# Patient Record
Sex: Female | Born: 1960 | Race: Black or African American | Hispanic: No | Marital: Single | Smoking: Light tobacco smoker
Health system: Southern US, Community
[De-identification: ages and names within clinical notes are randomized; demographics above are authoritative.]

## PROBLEM LIST (undated history)

## (undated) DIAGNOSIS — C9110 Chronic lymphocytic leukemia of B-cell type not having achieved remission: Secondary | ICD-10-CM

## (undated) DIAGNOSIS — G8929 Other chronic pain: Secondary | ICD-10-CM

## (undated) DIAGNOSIS — A048 Other specified bacterial intestinal infections: Secondary | ICD-10-CM

## (undated) DIAGNOSIS — F29 Unspecified psychosis not due to a substance or known physiological condition: Secondary | ICD-10-CM

## (undated) DIAGNOSIS — I10 Essential (primary) hypertension: Secondary | ICD-10-CM

## (undated) DIAGNOSIS — E611 Iron deficiency: Principal | ICD-10-CM

## (undated) DIAGNOSIS — F419 Anxiety disorder, unspecified: Secondary | ICD-10-CM

## (undated) DIAGNOSIS — R109 Unspecified abdominal pain: Secondary | ICD-10-CM

## (undated) DIAGNOSIS — R591 Generalized enlarged lymph nodes: Secondary | ICD-10-CM

## (undated) DIAGNOSIS — F17200 Nicotine dependence, unspecified, uncomplicated: Secondary | ICD-10-CM

## (undated) HISTORY — DX: Unspecified abdominal pain: R10.9

## (undated) HISTORY — DX: Unspecified psychosis not due to a substance or known physiological condition: F29

## (undated) HISTORY — DX: Generalized enlarged lymph nodes: R59.1

## (undated) HISTORY — PX: OTHER SURGICAL HISTORY: SHX169

## (undated) HISTORY — DX: Nicotine dependence, unspecified, uncomplicated: F17.200

## (undated) HISTORY — DX: Essential (primary) hypertension: I10

## (undated) HISTORY — DX: Chronic lymphocytic leukemia of B-cell type not having achieved remission: C91.10

## (undated) HISTORY — DX: Other chronic pain: G89.29

## (undated) HISTORY — PX: CHOLECYSTECTOMY: SHX55

## (undated) HISTORY — DX: Iron deficiency: E61.1

## (undated) HISTORY — DX: Anxiety disorder, unspecified: F41.9

## (undated) HISTORY — PX: HAMMER TOE SURGERY: SHX385

## (undated) HISTORY — DX: Other specified bacterial intestinal infections: A04.8

## (undated) HISTORY — PX: TUBAL LIGATION: SHX77

---

## 2003-03-09 ENCOUNTER — Inpatient Hospital Stay (HOSPITAL_COMMUNITY): Admission: EM | Admit: 2003-03-09 | Discharge: 2003-03-13 | Payer: Self-pay | Admitting: Psychiatry

## 2003-11-03 ENCOUNTER — Emergency Department (HOSPITAL_COMMUNITY): Admission: EM | Admit: 2003-11-03 | Discharge: 2003-11-03 | Payer: Self-pay | Admitting: Emergency Medicine

## 2003-11-22 ENCOUNTER — Ambulatory Visit (HOSPITAL_COMMUNITY): Admission: RE | Admit: 2003-11-22 | Discharge: 2003-11-22 | Payer: Self-pay | Admitting: Family Medicine

## 2003-12-21 ENCOUNTER — Ambulatory Visit (HOSPITAL_COMMUNITY): Admission: RE | Admit: 2003-12-21 | Discharge: 2003-12-21 | Payer: Self-pay | Admitting: Family Medicine

## 2003-12-27 ENCOUNTER — Ambulatory Visit (HOSPITAL_COMMUNITY): Admission: RE | Admit: 2003-12-27 | Discharge: 2003-12-27 | Payer: Self-pay | Admitting: Family Medicine

## 2004-02-01 ENCOUNTER — Ambulatory Visit (HOSPITAL_COMMUNITY): Admission: RE | Admit: 2004-02-01 | Discharge: 2004-02-01 | Payer: Self-pay | Admitting: Family Medicine

## 2004-02-05 ENCOUNTER — Ambulatory Visit (HOSPITAL_COMMUNITY): Admission: RE | Admit: 2004-02-05 | Discharge: 2004-02-05 | Payer: Self-pay | Admitting: Family Medicine

## 2004-02-12 ENCOUNTER — Ambulatory Visit (HOSPITAL_COMMUNITY): Admission: RE | Admit: 2004-02-12 | Discharge: 2004-02-12 | Payer: Self-pay | Admitting: Family Medicine

## 2004-03-19 ENCOUNTER — Inpatient Hospital Stay (HOSPITAL_COMMUNITY): Admission: RE | Admit: 2004-03-19 | Discharge: 2004-03-20 | Payer: Self-pay | Admitting: Interventional Radiology

## 2004-07-25 ENCOUNTER — Ambulatory Visit: Payer: Self-pay | Admitting: Family Medicine

## 2005-03-24 ENCOUNTER — Ambulatory Visit: Payer: Self-pay | Admitting: Family Medicine

## 2005-04-01 ENCOUNTER — Ambulatory Visit (HOSPITAL_COMMUNITY): Admission: RE | Admit: 2005-04-01 | Discharge: 2005-04-01 | Payer: Self-pay | Admitting: Family Medicine

## 2005-05-13 ENCOUNTER — Ambulatory Visit: Payer: Self-pay | Admitting: Family Medicine

## 2006-01-29 ENCOUNTER — Encounter: Payer: Self-pay | Admitting: Family Medicine

## 2006-01-29 LAB — CONVERTED CEMR LAB: Pap Smear: NORMAL

## 2007-01-05 ENCOUNTER — Ambulatory Visit: Payer: Self-pay | Admitting: Family Medicine

## 2007-01-05 LAB — CONVERTED CEMR LAB
ALT: 11 units/L (ref 0–35)
Alkaline Phosphatase: 151 units/L — ABNORMAL HIGH (ref 39–117)
BUN: 8 mg/dL (ref 6–23)
Basophils Relative: 0 % (ref 0–1)
Cholesterol: 150 mg/dL (ref 0–200)
Creatinine, Ser: 0.62 mg/dL (ref 0.40–1.20)
Eosinophils Absolute: 0.1 10*3/uL (ref 0.0–0.7)
Indirect Bilirubin: 0.4 mg/dL (ref 0.0–0.9)
MCHC: 33.8 g/dL (ref 30.0–36.0)
MCV: 87.8 fL (ref 78.0–100.0)
Monocytes Relative: 7 % (ref 3–11)
Neutrophils Relative %: 47 % (ref 43–77)
Potassium: 3 meq/L — ABNORMAL LOW (ref 3.5–5.3)
RBC: 4.42 M/uL (ref 3.87–5.11)
Total Protein: 7.9 g/dL (ref 6.0–8.3)
Triglycerides: 78 mg/dL (ref <150)
VLDL: 16 mg/dL (ref 0–40)

## 2007-01-12 ENCOUNTER — Ambulatory Visit: Payer: Self-pay | Admitting: Family Medicine

## 2007-01-22 ENCOUNTER — Ambulatory Visit: Payer: Self-pay | Admitting: Family Medicine

## 2007-01-23 ENCOUNTER — Encounter: Payer: Self-pay | Admitting: Family Medicine

## 2007-01-29 ENCOUNTER — Ambulatory Visit (HOSPITAL_COMMUNITY): Admission: RE | Admit: 2007-01-29 | Discharge: 2007-01-29 | Payer: Self-pay | Admitting: Family Medicine

## 2007-02-05 ENCOUNTER — Encounter: Payer: Self-pay | Admitting: Family Medicine

## 2007-02-05 LAB — CONVERTED CEMR LAB
BUN: 13 mg/dL (ref 6–23)
Calcium: 9.4 mg/dL (ref 8.4–10.5)
Glucose, Bld: 75 mg/dL (ref 70–99)

## 2007-02-09 ENCOUNTER — Encounter: Payer: Self-pay | Admitting: Family Medicine

## 2007-02-09 ENCOUNTER — Ambulatory Visit: Payer: Self-pay | Admitting: Family Medicine

## 2007-02-09 ENCOUNTER — Other Ambulatory Visit: Admission: RE | Admit: 2007-02-09 | Discharge: 2007-02-09 | Payer: Self-pay | Admitting: Family Medicine

## 2007-02-09 LAB — CONVERTED CEMR LAB: Pap Smear: NORMAL

## 2007-06-14 ENCOUNTER — Ambulatory Visit: Payer: Self-pay | Admitting: Family Medicine

## 2007-06-18 ENCOUNTER — Ambulatory Visit: Payer: Self-pay | Admitting: Family Medicine

## 2007-08-05 ENCOUNTER — Ambulatory Visit: Payer: Self-pay | Admitting: Family Medicine

## 2007-09-03 ENCOUNTER — Encounter: Payer: Self-pay | Admitting: Family Medicine

## 2007-09-20 ENCOUNTER — Ambulatory Visit: Payer: Self-pay | Admitting: Family Medicine

## 2007-09-27 ENCOUNTER — Emergency Department (HOSPITAL_COMMUNITY): Admission: EM | Admit: 2007-09-27 | Discharge: 2007-09-28 | Payer: Self-pay | Admitting: Emergency Medicine

## 2007-10-05 ENCOUNTER — Ambulatory Visit: Payer: Self-pay | Admitting: Family Medicine

## 2007-10-05 LAB — CONVERTED CEMR LAB
ALT: 12 units/L (ref 0–35)
Albumin: 4.6 g/dL (ref 3.5–5.2)
BUN: 9 mg/dL (ref 6–23)
Basophils Absolute: 0 10*3/uL (ref 0.0–0.1)
CO2: 21 meq/L (ref 19–32)
Chloride: 102 meq/L (ref 96–112)
Creatinine, Ser: 0.71 mg/dL (ref 0.40–1.20)
Glucose, Bld: 98 mg/dL (ref 70–99)
HCT: 38.8 % (ref 36.0–46.0)
Hemoglobin: 13 g/dL (ref 12.0–15.0)
Indirect Bilirubin: 0.3 mg/dL (ref 0.0–0.9)
Lymphocytes Relative: 40 % (ref 12–46)
Lymphs Abs: 3 10*3/uL (ref 0.7–4.0)
Monocytes Absolute: 0.7 10*3/uL (ref 0.1–1.0)
Monocytes Relative: 9 % (ref 3–12)
Neutro Abs: 3.8 10*3/uL (ref 1.7–7.7)
RBC: 4.36 M/uL (ref 3.87–5.11)
Total Protein: 8.2 g/dL (ref 6.0–8.3)

## 2007-10-13 ENCOUNTER — Encounter: Payer: Self-pay | Admitting: Family Medicine

## 2007-10-17 DIAGNOSIS — A048 Other specified bacterial intestinal infections: Secondary | ICD-10-CM

## 2007-10-17 HISTORY — DX: Other specified bacterial intestinal infections: A04.8

## 2007-11-01 ENCOUNTER — Ambulatory Visit: Payer: Self-pay | Admitting: Gastroenterology

## 2007-11-24 DIAGNOSIS — I10 Essential (primary) hypertension: Secondary | ICD-10-CM | POA: Insufficient documentation

## 2007-11-24 DIAGNOSIS — R109 Unspecified abdominal pain: Secondary | ICD-10-CM | POA: Insufficient documentation

## 2007-11-24 DIAGNOSIS — F29 Unspecified psychosis not due to a substance or known physiological condition: Secondary | ICD-10-CM | POA: Insufficient documentation

## 2007-11-26 ENCOUNTER — Ambulatory Visit: Payer: Self-pay | Admitting: Family Medicine

## 2007-11-30 ENCOUNTER — Ambulatory Visit: Payer: Self-pay | Admitting: Gastroenterology

## 2007-11-30 ENCOUNTER — Encounter: Payer: Self-pay | Admitting: Family Medicine

## 2008-01-26 ENCOUNTER — Ambulatory Visit: Payer: Self-pay | Admitting: Family Medicine

## 2008-02-02 ENCOUNTER — Ambulatory Visit (HOSPITAL_COMMUNITY): Admission: RE | Admit: 2008-02-02 | Discharge: 2008-02-02 | Payer: Self-pay | Admitting: Family Medicine

## 2008-03-30 ENCOUNTER — Encounter: Payer: Self-pay | Admitting: Family Medicine

## 2008-03-30 LAB — CONVERTED CEMR LAB
BUN: 9 mg/dL (ref 6–23)
Cholesterol: 152 mg/dL (ref 0–200)
Creatinine, Ser: 0.65 mg/dL (ref 0.40–1.20)
Glucose, Bld: 77 mg/dL (ref 70–99)
LDL Cholesterol: 76 mg/dL (ref 0–99)
Potassium: 4.2 meq/L (ref 3.5–5.3)
VLDL: 13 mg/dL (ref 0–40)

## 2008-04-24 DIAGNOSIS — Z9189 Other specified personal risk factors, not elsewhere classified: Secondary | ICD-10-CM | POA: Insufficient documentation

## 2008-04-24 DIAGNOSIS — Z8711 Personal history of peptic ulcer disease: Secondary | ICD-10-CM

## 2008-04-24 DIAGNOSIS — R634 Abnormal weight loss: Secondary | ICD-10-CM

## 2008-04-24 DIAGNOSIS — R112 Nausea with vomiting, unspecified: Secondary | ICD-10-CM

## 2008-06-14 ENCOUNTER — Ambulatory Visit: Payer: Self-pay | Admitting: Family Medicine

## 2008-06-19 ENCOUNTER — Telehealth: Payer: Self-pay | Admitting: Family Medicine

## 2008-06-19 ENCOUNTER — Telehealth (INDEPENDENT_AMBULATORY_CARE_PROVIDER_SITE_OTHER): Payer: Self-pay | Admitting: *Deleted

## 2008-08-20 ENCOUNTER — Emergency Department (HOSPITAL_COMMUNITY): Admission: EM | Admit: 2008-08-20 | Discharge: 2008-08-20 | Payer: Self-pay | Admitting: Emergency Medicine

## 2008-08-23 ENCOUNTER — Encounter: Payer: Self-pay | Admitting: Family Medicine

## 2008-08-23 ENCOUNTER — Other Ambulatory Visit: Admission: RE | Admit: 2008-08-23 | Discharge: 2008-08-23 | Payer: Self-pay | Admitting: Family Medicine

## 2008-08-23 ENCOUNTER — Ambulatory Visit: Payer: Self-pay | Admitting: Family Medicine

## 2008-08-30 ENCOUNTER — Observation Stay (HOSPITAL_COMMUNITY): Admission: RE | Admit: 2008-08-30 | Discharge: 2008-08-31 | Payer: Self-pay | Admitting: General Surgery

## 2008-08-30 ENCOUNTER — Encounter (INDEPENDENT_AMBULATORY_CARE_PROVIDER_SITE_OTHER): Payer: Self-pay | Admitting: General Surgery

## 2009-01-29 ENCOUNTER — Ambulatory Visit: Payer: Self-pay | Admitting: Family Medicine

## 2009-01-29 DIAGNOSIS — M79609 Pain in unspecified limb: Secondary | ICD-10-CM

## 2009-01-29 DIAGNOSIS — M542 Cervicalgia: Secondary | ICD-10-CM | POA: Insufficient documentation

## 2009-01-29 DIAGNOSIS — M546 Pain in thoracic spine: Secondary | ICD-10-CM | POA: Insufficient documentation

## 2009-01-29 DIAGNOSIS — M25519 Pain in unspecified shoulder: Secondary | ICD-10-CM

## 2009-01-29 HISTORY — DX: Pain in unspecified limb: M79.609

## 2009-01-30 ENCOUNTER — Telehealth: Payer: Self-pay | Admitting: Family Medicine

## 2009-01-30 LAB — CONVERTED CEMR LAB
Hemoglobin: 11.9 g/dL — ABNORMAL LOW (ref 12.0–15.0)
Lymphocytes Relative: 40 % (ref 12–46)
MCHC: 34.1 g/dL (ref 30.0–36.0)
Monocytes Absolute: 0.5 10*3/uL (ref 0.1–1.0)
Monocytes Relative: 7 % (ref 3–12)
Neutro Abs: 3.6 10*3/uL (ref 1.7–7.7)
RBC: 3.93 M/uL (ref 3.87–5.11)
Rhuematoid fact SerPl-aCnc: <20 intl units/mL (ref 0–20)
Sed Rate: 28 mm/hr — ABNORMAL HIGH (ref 0–22)

## 2009-01-31 ENCOUNTER — Ambulatory Visit (HOSPITAL_COMMUNITY): Admission: RE | Admit: 2009-01-31 | Discharge: 2009-01-31 | Payer: Self-pay | Admitting: Family Medicine

## 2009-02-02 ENCOUNTER — Telehealth: Payer: Self-pay | Admitting: Family Medicine

## 2009-02-26 ENCOUNTER — Encounter: Payer: Self-pay | Admitting: Family Medicine

## 2009-02-28 ENCOUNTER — Encounter: Payer: Self-pay | Admitting: Family Medicine

## 2009-05-14 ENCOUNTER — Ambulatory Visit: Payer: Self-pay | Admitting: Family Medicine

## 2009-05-14 DIAGNOSIS — N3020 Other chronic cystitis without hematuria: Secondary | ICD-10-CM

## 2009-05-14 LAB — CONVERTED CEMR LAB
Glucose, Urine, Semiquant: NEGATIVE
Nitrite: NEGATIVE
Urobilinogen, UA: 0.2

## 2009-05-15 ENCOUNTER — Encounter: Payer: Self-pay | Admitting: Family Medicine

## 2009-06-12 ENCOUNTER — Ambulatory Visit (HOSPITAL_COMMUNITY): Admission: RE | Admit: 2009-06-12 | Discharge: 2009-06-12 | Payer: Self-pay | Admitting: Family Medicine

## 2009-06-26 ENCOUNTER — Telehealth: Payer: Self-pay | Admitting: Family Medicine

## 2009-07-24 ENCOUNTER — Ambulatory Visit: Payer: Self-pay | Admitting: Family Medicine

## 2009-07-24 DIAGNOSIS — R5383 Other fatigue: Secondary | ICD-10-CM

## 2009-07-24 DIAGNOSIS — R5381 Other malaise: Secondary | ICD-10-CM

## 2009-07-24 LAB — CONVERTED CEMR LAB
Basophils Relative: 0 % (ref 0–1)
CO2: 19 meq/L (ref 19–32)
Calcium: 9.5 mg/dL (ref 8.4–10.5)
HDL: 65 mg/dL (ref >39)
Lymphs Abs: 3.2 10*3/uL (ref 0.7–4.0)
MCHC: 32.5 g/dL (ref 30.0–36.0)
Monocytes Relative: 8 % (ref 3–12)
Neutro Abs: 3.6 10*3/uL (ref 1.7–7.7)
Neutrophils Relative %: 48 % (ref 43–77)
RBC: 4.02 M/uL (ref 3.87–5.11)
Sodium: 141 meq/L (ref 135–145)
Total CHOL/HDL Ratio: 2.2
Triglycerides: 39 mg/dL (ref <150)
WBC: 7.6 10*3/uL (ref 4.0–10.5)

## 2009-07-25 ENCOUNTER — Telehealth: Payer: Self-pay | Admitting: Family Medicine

## 2009-07-25 DIAGNOSIS — J309 Allergic rhinitis, unspecified: Secondary | ICD-10-CM | POA: Insufficient documentation

## 2009-08-02 ENCOUNTER — Ambulatory Visit (HOSPITAL_COMMUNITY): Admission: RE | Admit: 2009-08-02 | Discharge: 2009-08-02 | Payer: Self-pay | Admitting: Family Medicine

## 2009-10-02 ENCOUNTER — Encounter (INDEPENDENT_AMBULATORY_CARE_PROVIDER_SITE_OTHER): Payer: Self-pay | Admitting: *Deleted

## 2009-12-27 ENCOUNTER — Ambulatory Visit: Payer: Self-pay | Admitting: Family Medicine

## 2009-12-27 ENCOUNTER — Other Ambulatory Visit: Admission: RE | Admit: 2009-12-27 | Discharge: 2009-12-27 | Payer: Self-pay | Admitting: Family Medicine

## 2009-12-27 DIAGNOSIS — N3000 Acute cystitis without hematuria: Secondary | ICD-10-CM

## 2009-12-27 DIAGNOSIS — N760 Acute vaginitis: Secondary | ICD-10-CM | POA: Insufficient documentation

## 2009-12-27 LAB — CONVERTED CEMR LAB
Glucose, Urine, Semiquant: NEGATIVE
Nitrite: NEGATIVE
Protein, U semiquant: NEGATIVE
Urobilinogen, UA: 0.2
WBC Urine, dipstick: NEGATIVE
pH: 6

## 2009-12-28 ENCOUNTER — Encounter: Payer: Self-pay | Admitting: Family Medicine

## 2009-12-28 LAB — CONVERTED CEMR LAB
Bacteria, UA: NONE SEEN
Bilirubin Urine: NEGATIVE
Chlamydia, DNA Probe: NEGATIVE
Crystals: NONE SEEN
Ketones, ur: NEGATIVE mg/dL
RBC / HPF: NONE SEEN (ref <3)
Specific Gravity, Urine: 1.014 (ref 1.005–1.030)
Urobilinogen, UA: 0.2 (ref 0.0–1.0)
pH: 6 (ref 5.0–8.0)

## 2010-01-01 ENCOUNTER — Encounter: Payer: Self-pay | Admitting: Family Medicine

## 2010-01-08 ENCOUNTER — Encounter: Payer: Self-pay | Admitting: Family Medicine

## 2010-04-29 ENCOUNTER — Ambulatory Visit: Payer: Self-pay | Admitting: Family Medicine

## 2010-04-29 DIAGNOSIS — F17200 Nicotine dependence, unspecified, uncomplicated: Secondary | ICD-10-CM

## 2010-08-01 ENCOUNTER — Ambulatory Visit: Payer: Self-pay | Admitting: Family Medicine

## 2010-08-01 DIAGNOSIS — F4323 Adjustment disorder with mixed anxiety and depressed mood: Secondary | ICD-10-CM | POA: Insufficient documentation

## 2010-08-07 LAB — CONVERTED CEMR LAB
BUN: 12 mg/dL (ref 6–23)
Basophils Absolute: 0 10*3/uL (ref 0.0–0.1)
Basophils Relative: 0 % (ref 0–1)
Cholesterol: 162 mg/dL (ref 0–200)
Creatinine, Ser: 0.58 mg/dL (ref 0.40–1.20)
Eosinophils Absolute: 0.1 10*3/uL (ref 0.0–0.7)
HDL: 71 mg/dL (ref >39)
Hemoglobin: 12.5 g/dL (ref 12.0–15.0)
MCHC: 33.2 g/dL (ref 30.0–36.0)
MCV: 90.8 fL (ref 78.0–100.0)
Monocytes Absolute: 0.6 10*3/uL (ref 0.1–1.0)
Monocytes Relative: 8 % (ref 3–12)
Neutrophils Relative %: 44 % (ref 43–77)
RBC: 4.14 M/uL (ref 3.87–5.11)
RDW: 15.6 % — ABNORMAL HIGH (ref 11.5–15.5)
Triglycerides: 38 mg/dL (ref <150)

## 2010-08-10 DIAGNOSIS — E559 Vitamin D deficiency, unspecified: Secondary | ICD-10-CM

## 2010-08-12 ENCOUNTER — Ambulatory Visit (HOSPITAL_COMMUNITY): Admission: RE | Admit: 2010-08-12 | Discharge: 2010-08-12 | Payer: Self-pay | Admitting: Family Medicine

## 2010-08-20 ENCOUNTER — Telehealth: Payer: Self-pay | Admitting: Family Medicine

## 2010-10-05 ENCOUNTER — Encounter: Payer: Self-pay | Admitting: Interventional Radiology

## 2010-10-07 ENCOUNTER — Encounter: Payer: Self-pay | Admitting: Family Medicine

## 2010-10-08 ENCOUNTER — Telehealth: Payer: Self-pay | Admitting: Family Medicine

## 2010-10-15 NOTE — Progress Notes (Signed)
Summary: please advise  Phone Note Other Incoming   Caller: Michell Heinrich from [employer]. Summary of Call: called in and states that Dr. Lodema Hong put patient on a small dose of paxil generic, and patient called her and stated it was too much for her.  She states the patient told her she couldn't go out of the house if she took this medication.  Victorino Dike wanted to know if patient could cut the tablet in half and take it that away.  Please call Victorino Dike with the answer Victorino Dike [phone] said that the Medication was helping with the  OCD and anixety.   Initial call taken by: Curtis Sites,  August 20, 2010 2:59 PM  Follow-up for Phone Call        yes she can do this, pls let her know Follow-up by: Syliva Overman MD,  August 21, 2010 4:59 PM  Additional Follow-up for Phone Call Additional follow up Details #1::        called social worker and left message Additional Follow-up by: Adella Hare LPN,  August 21, 2010 5:15 PM    Additional Follow-up for Phone Call Additional follow up Details #2::    social worker aware Follow-up by: Adella Hare LPN,  August 22, 2010 8:28 AM

## 2010-10-15 NOTE — Miscellaneous (Signed)
Summary: Home Care Report  Home Care Report   Imported By: Lind Guest 01/08/2010 14:51:26  _____________________________________________________________________  External Attachment:    Type:   Image     Comment:   External Document

## 2010-10-15 NOTE — Assessment & Plan Note (Signed)
Summary: F UP   Vital Signs:  Patient profile:   50 year old female Menstrual status:  regular Height:      65 inches Weight:      142.08 pounds BMI:     23.73 O2 Sat:      95 % on Room air Pulse rate:   67 / minute BP sitting:   132 / 68  (right arm) CC: needs a prescription for her pain medication, a follow up room 3 Is Patient Diabetic? No Pain Assessment Patient in pain? no        CC:  needs a prescription for her pain medication and a follow up room 3.  History of Present Illness: Pt is here today for medication refill.  She uses Ibuprofen 800 mg as needed for pain from arthritis.  "widespread pain".  Pt states her pain is mostly in her back and arms.  No joint swelling or redness.  She takes the Ibuprofen as needed, not daily, and works well for her pain.  Hx of HTN. Controlled off meds.  No HA, dizziness, chest pain or pressure. Pt is also a smoker.  States she is smoking 7-9 cigs a day.  Has been able to get as low as 5 cigs per day but hasnt been able to completely quit yet.     Allergies: No Known Drug Allergies  Past History:  Past medical history reviewed for relevance to current acute and chronic problems.  Past Medical History: Reviewed history from 09/03/2007 and no changes required. NICOTINE ADDICTION (ICD-305.1) UNSPECIFIED PSYCHOSIS (ICD-298.9) HYPERTENSION (ICD-401.9) ABDOMINAL PAIN, CHRONIC (ICD-789.00)  Review of Systems CV:  Denies chest pain or discomfort, lightheadness, and palpitations. Resp:  Denies shortness of breath. MS:  Complains of joint pain and thoracic pain; denies joint redness, joint swelling, and muscle weakness. Neuro:  Denies headaches, numbness, and tingling.  Physical Exam  General:  Well-developed,well-nourished,in no acute distress; alert,appropriate and cooperative throughout examination Head:  Normocephalic and atraumatic without obvious abnormalities. No apparent alopecia or balding. Ears:  External ear exam shows  no significant lesions or deformities.  Otoscopic examination reveals clear canals, tympanic membranes are intact bilaterally without bulging, retraction, inflammation or discharge. Hearing is grossly normal bilaterally. Nose:  External nasal examination shows no deformity or inflammation. Nasal mucosa are pink and moist without lesions or exudates. Mouth:  Oral mucosa and oropharynx without lesions or exudates. poor dentition.   Neck:  No deformities, masses, or tenderness noted.no thyromegaly.   Lungs:  Normal respiratory effort, chest expands symmetrically. Lungs are clear to auscultation, no crackles or wheezes. Heart:  Normal rate and regular rhythm. S1 and S2 normal without gallop, murmur, click, rub or other extra sounds. Extremities:  No clubbing, cyanosis, edema, or deformity noted with normal full range of motion of all joints.   Neurologic:  alert & oriented X3, sensation intact to light touch, and gait normal.   Cervical Nodes:  No lymphadenopathy noted Psych:  Cognition and judgment appear intact. Alert and cooperative with normal attention span and concentration. No apparent delusions, illusions, hallucinations   Impression & Recommendations:  Problem # 1:  BACK PAIN, THORACIC REGION (ICD-724.1) Assessment Comment Only  Her updated medication list for this problem includes:    Ibuprofen 800 Mg Tabs (Ibuprofen) .Marland Kitchen... Take 1 tablet by mouth two times a day  for 10 days then a daily as needed , start in am  Problem # 2:  HYPERTENSION (ICD-401.9) Assessment: Improved  The following medications were removed from  the medication list:    Maxzide-25 37.5-25 Mg Tabs (Triamterene-hctz) .Marland Kitchen... Take 1 tablet by mouth once a day  Problem # 3:  NICOTINE ADDICTION (ICD-305.1)  Encouraged smoking cessation   Complete Medication List: 1)  Ibuprofen 800 Mg Tabs (Ibuprofen) .... Take 1 tablet by mouth two times a day  for 10 days then a daily as needed , start in am  Patient  Instructions: 1)  Please schedule a follow-up appointment in 3 months. 2)  I have refilled your Ibuprofen for you. 3)  Tobacco is very bad for your health and your loved ones! You Should stop smoking!. 4)  Stop Smoking Tips: Choose a Quit date. Cut down before the Quit date. decide what you will do as a substitute when you feel the urge to smoke(gum,toothpick,exercise). Prescriptions: IBUPROFEN 800 MG TABS (IBUPROFEN) Take 1 tablet by mouth two times a day  for 10 days then a daily as needed , start in am  #40 Each x 1   Entered and Authorized by:   Esperanza Sheets PA   Signed by:   Esperanza Sheets PA on 04/29/2010   Method used:   Electronically to        [facility] (retail)       [address]       [place], [place]  [address]       Ph: [phone]       Fax: [phone]   RxID:   [id number]

## 2010-10-15 NOTE — Letter (Signed)
Summary: 1st Missed Appt.  [facility]  [address]   [place], [place] [address]   Phone: [phone]  Fax: [phone]    October 02, 2009  MRN: [id number]  [facility] [address] [place], [place]  [address]  Dear Ms. Allison Quarry,  At [facility], we make every attempt to fit patients into our schedule by reserving several appointment slots for same-day appointments.  However, we cannot always make appointments for patients the same day they are calling.  At the end of the day, we look back at our schedule and find that because of last-minute cancellations and patients not showing up for their scheduled appointments, we have several appointment slots that are left open and could have been used by another person who really needed it.  In the past, you may have been one of the patients who could not get in when you needed to.  But recently, you were one of the patients with an appointment that you didn't show up for or canceled too late for [place] to fill it.  We choose not to charge no-show or last minute cancellation fees to our patients, like many other offices do.  We do not wish to institute that policy and hope we never have to.  However, we kindly request that you assist [place] by providing at least 24 hours' notice if you can't make your appointment.  If no-shows or late cancellations become habitual (i.e. Three or more in a one-year period), we may terminate the physician-patient relationship.    Thank you for your consideration and cooperation.   Altamease Oiler   Appended Document: 1st Missed Appt. pt had appt for Babs Bertin day, she reliwes on public transpt and this was not available

## 2010-10-15 NOTE — Letter (Signed)
Summary: Letter  Letter   Imported By: Lind Guest 01/02/2010 13:01:00  _____________________________________________________________________  External Attachment:    Type:   Image     Comment:   External Document

## 2010-10-15 NOTE — Assessment & Plan Note (Signed)
Summary: FEMALE TROUBLES   Vital Signs:  Patient profile:   50 year old female Menstrual status:  regular Height:      65 inches Weight:      177.06 pounds BMI:     29.57 O2 Sat:      94 % Pulse rate:   92 / minute Pulse rhythm:   regular Resp:     16 per minute BP sitting:   134 / 84  (left arm) Cuff size:   regular  Vitals Entered By: Everitt Amber LPN (December 27, 2009 8:59 AM)  Nutrition Counseling: Patient's BMI is greater than 25 and therefore counseled on weight management options. CC: states she has a yeast infection, has thick white discharge and also states she has been urinating frequently. Sometimes she has burning off and on and it has a strong odor   CC:  states she has a yeast infection and has thick white discharge and also states she has been urinating frequently. Sometimes she has burning off and on and it has a strong odor.  History of Present Illness: Reports  that she is generally  doing well. Denies recent fever or chills. Denies sinus pressure, nasal congestion , ear pain or sore throat. Denies chest congestion, or cough productive of sputum. Denies chest pain, palpitations, PND, orthopnea or leg swelling. Denies abdominal pain, nausea, vomitting, diarrhea or constipation. Denies change in bowel movements or bloody stool. Reports dysuria, frequency and thick puritic vag d/c intermittently x 6 weeks Denies  joint pain, swelling, or reduced mobility. Denies headaches, vertigo, seizures. Denies depression, anxiety or insomnia. Denies  rash, lesions, or itch.She does have chronically dry, thickened skin on her soles.     Current Medications (verified): 1)  Ibuprofen 800 Mg Tabs (Ibuprofen) .... Take 1 Tablet By Mouth Two Times A Day  For 10 Days Then A Daily As Needed , Start in Am 2)  Maxzide-25 37.5-25 Mg Tabs (Triamterene-Hctz) .... Take 1 Tablet By Mouth Once A Day 3)  Zyrtec Hives Relief 10 Mg Tabs (Cetirizine Hcl) .... Take 1 Tablet By Mouth Once A Day  As Needed  Allergies (verified): No Known Drug Allergies  Review of Systems Eyes:  Denies blurring, discharge, and red eye. GU:  Complains of discharge, dysuria, and urinary frequency; thick white urine for some time sincwe February no itch. Derm:  Complains of dryness; dry thickened skin on soles. Endo:  Denies excessive hunger and excessive thirst. Heme:  Denies abnormal bruising and bleeding. Allergy:  Complains of seasonal allergies; denies hives or rash and itching eyes; primarily in Spring and fall.  Physical Exam  General:  alert, well-nourished, and well-hydrated. HEENT: No facial asymmetry,  EOMI, No sinus tenderness, TM's Clear, oropharynx  pink and moist. Poor dentition  Chest: Clear to auscultation bilaterally.  CVS: S1, S2, No murmurs, No S3.   Abd: Soft, Nontender.  MS: Adequate ROM spine, hips, shoulders and knees.  Ext: No edema.   CNS: CN 2-12 intact, power tone and sensation normal throughout.   Skin: Intact, no visible lesions or rashes.  Psych: Good eye contact, normal affect.  Memory intact, not anxious or depressed appearing. Genital:uterus normal size, no adnexal masses, positive cervical motion tendrness, thick white d/c    Impression & Recommendations:  Problem # 1:  ACUTE CYSTITIS (ICD-595.0) Assessment Comment Only  Orders: T-Culture, Urine ([id number]) T-Urinalysis (81003-65000) nitrite andleukoneg, pos blood UA Dipstick W/ Micro (manual) (14782)  Encouraged to push clear liquids, get enough rest, and take acetaminophen  as needed. To be seen in 10 days if no improvement, sooner if worse.  Problem # 2:  UNSPECIFIED VAGINITIS AND VULVOVAGINITIS (ICD-616.10) Assessment: Comment Only  Orders: T-Chlamydia & GC Probe, Genital ([id number]) T-Wet Prep by Molecular Probe [phone])  will treat based on results  Problem # 3:  ALLERGIC RHINITIS CAUSE UNSPECIFIED (ICD-477.9) Assessment: Unchanged  Her updated medication list for this  problem includes:    Zyrtec Hives Relief 10 Mg Tabs (Cetirizine hcl) .Marland Kitchen... Take 1 tablet by mouth once a day as needed  Problem # 4:  HYPERTENSION (ICD-401.9) Assessment: Unchanged  Her updated medication list for this problem includes:    Maxzide-25 37.5-25 Mg Tabs (Triamterene-hctz) .Marland Kitchen... Take 1 tablet by mouth once a day  BP today: 134/84 Prior BP: 135/85 (07/24/2009)  Labs Reviewed: K+: 3.5 (07/24/2009) Creat: : 0.59 (07/24/2009)   Chol: 145 (07/24/2009)   HDL: 65 (07/24/2009)   LDL: 72 (07/24/2009)   TG: 39 (07/24/2009)  Complete Medication List: 1)  Ibuprofen 800 Mg Tabs (Ibuprofen) .... Take 1 tablet by mouth two times a day  for 10 days then a daily as needed , start in am 2)  Maxzide-25 37.5-25 Mg Tabs (Triamterene-hctz) .... Take 1 tablet by mouth once a day 3)  Zyrtec Hives Relief 10 Mg Tabs (Cetirizine hcl) .... Take 1 tablet by mouth once a day as needed  Other Orders: Hemoccult Guaiac-1 spec.(in office) (82270) Pap Smear (96295)  Patient Instructions: 1)  Please schedule a follow-up appointment in 4 months. 2)  It is important that you exercise regularly at least 20 minutes 5 times a week. If you develop chest pain, have severe difficulty breathing, or feel very tired , stop exercising immediately and seek medical attention. 3)  we will send your samples for testing and getyback to you with results, and treatif needed 4)  Continue current meds  Laboratory Results   Urine Tests    Routine Urinalysis   Color: lt. yellow Appearance: Clear Glucose: negative   (Normal Range: Negative) Bilirubin: negative   (Normal Range: Negative) Ketone: negative   (Normal Range: Negative) Spec. Gravity: 1.020   (Normal Range: 1.003-1.035) Blood: small   (Normal Range: Negative) pH: 6.0   (Normal Range: 5.0-8.0) Protein: negative   (Normal Range: Negative) Urobilinogen: 0.2   (Normal Range: 0-1) Nitrite: negative   (Normal Range: Negative) Leukocyte Esterace: negative    (Normal Range: Negative)      Stool - Occult Blood Hemmoccult #1: negative Date: 12/27/2009 Comments: [id number] 9r 8/11 118 10 12

## 2010-10-15 NOTE — Assessment & Plan Note (Signed)
Summary: office visit   Vital Signs:  Patient profile:   50 year old female Menstrual status:  regular Height:      65 inches Weight:      140 pounds BMI:     23.38 O2 Sat:      98 % on Room air Pulse rate:   91 / minute Pulse rhythm:   regular Resp:     16 per minute BP sitting:   154 / 82  (left arm)  Vitals Entered By: Mauricia Area CMA (August 01, 2010 1:02 PM)  O2 Flow:  Room air CC: Follow up Comments Case worker suggests meds for anxiety   CC:  Follow up.  History of Present Illness: Pt in with her social; worker who states that recently she had her apt painted  and this was left in a trashy situation , she is now having  to buy new things.The pt i s reportedly demonstrating increased anxiety and a deterioration in her mentalhealth,this is themain reason for the visit, Shannah stopped her bP meds because she thought it was not necessary and was causing her to feel badly, she is refusing to resume at this time   Preventive Screening-Counseling & Management  Alcohol-Tobacco     Smoking Cessation Counseling: yes  Current Medications (verified): 1)  Ibuprofen 800 Mg Tabs (Ibuprofen) .... Take 1 Tablet By Mouth Two Times A Day  For 10 Days Then A Daily As Needed , Start in Am  Allergies (verified): No Known Drug Allergies  Review of Systems      See HPI General:  Complains of fatigue. Eyes:  Denies discharge and red eye. ENT:  Denies hoarseness, nasal congestion, and sinus pressure. CV:  Denies chest pain or discomfort, palpitations, and swelling of feet. Resp:  Complains of cough and shortness of breath; denies sputum productive and wheezing. GI:  Denies abdominal pain, constipation, diarrhea, nausea, and vomiting blood. GU:  Denies discharge, dysuria, and urinary frequency. MS:  Denies joint pain, low back pain, mid back pain, and stiffness. Derm:  Complains of lesion(s); thick scaling of soles of feet. Neuro:  Denies headaches and seizures. Psych:  Complains  of anxiety, irritability, and mental problems; denies suicidal thoughts/plans, thoughts of violence, and unusual visions or sounds. Endo:  Denies cold intolerance, excessive thirst, excessive urination, and heat intolerance. Heme:  Denies abnormal bruising and bleeding. Allergy:  Complains of seasonal allergies.  Physical Exam  General:  Well-developed,well-nourished,in no acute distress; alert,appropriate and cooperative throughout examination HEENT: No facial asymmetry,  EOMI, No sinus tenderness, TM's Clear, oropharynx  pink and moist.   Chest: Clear to auscultation bilaterally. decreased air entry bilaterally CVS: S1, S2, No murmurs, No S3.   Abd: Soft, Nontender.  MS: Adequate ROM spine, hips, shoulders and knees.  Ext: No edema.   CNS: CN 2-12 intact, power tone and sensation normal throughout.   Skin: Intact, no visible lesions or rashes.  Psych: Good eye contact, normal affect.  Memory fair, anxious    Impression & Recommendations:  Problem # 1:  ADJ DISORDER WITH MIXED ANXIETY & DEPRESSED MOOD (ICD-309.28) Assessment Deteriorated paxil prescribed  Problem # 2:  NICOTINE ADDICTION (ICD-305.1) Assessment: Unchanged  Encouraged smoking cessation and discussed different methods for smoking cessation.   Problem # 3:  ESSENTIAL HYPERTENSION (ICD-401.9) Assessment: Deteriorated  Orders: T-Basic Metabolic Panel [phone])  BP today: 154/82 Prior BP: 132/68 (04/29/2010) Patient advised to follow low sodium diet rich in fruit and vegetables, and to commit to at least  30 minutes 5 days per week of regular exercise , to improve blood presure control.  pt refusing md at this time Labs Reviewed: K+: 3.5 (07/24/2009) Creat: : 0.59 (07/24/2009)   Chol: 145 (07/24/2009)   HDL: 65 (07/24/2009)   LDL: 72 (07/24/2009)   TG: 39 (07/24/2009)  Problem # 4:  UNSPECIFIED VITAMIN D DEFICIENCY (ICD-268.9) Assessment: Comment Only supplements tstarted  Complete Medication List: 1)   Ibuprofen 800 Mg Tabs (Ibuprofen) .... Take 1 tablet by mouth two times a day  for 10 days then a daily as needed , start in am 2)  Paroxetine Hcl 12.5 Mg Xr24h-tab (Paroxetine hcl) .... Take 1 tablet by mouth once a day 3)  Vitamin D (ergocalciferol) 50000 Unit Caps (Ergocalciferol) .... One capsule once weekly 4)  Oscal 500/200 D-3 500-200 Mg-unit Tabs (Calcium carbonate-vitamin d) .... Take 1 tablet by mouth three times a day  Other Orders: T-Lipid Profile [phone]) T-CBC w/Diff 713 272 2599) T-TSH [phone]) T-Vitamin D (25-Hydroxy) [phone]) Influenza Vaccine NON MCR (94854) Radiology Referral (Radiology)  Patient Instructions: 1)  CPE in 2 to 2.5 months. 2)  BMP prior to visit, ICD-9: 3)  Lipid Panel prior to visit, ICD-9: 4)  TSH prior to visit, ICD-9:   today fasting 5)  CBC w/ Diff prior to visit, ICD-9 6)  Vitamin D: 7)  Tobacco is very bad for your health and your loved ones! You Should stop smoking!. 8)  Stop Smoking Tips: Choose a Quit date. Cut down before the Quit date. decide what you will do as a substitute when you feel the urge to smoke(gum,toothpick,exercise). 9)  Flu vac today 10)  [place] asap , we will schedule Prescriptions: OSCAL 500/200 D-3 500-200 MG-UNIT TABS (CALCIUM CARBONATE-VITAMIN D) Take 1 tablet by mouth three times a day  #90 x 11   Entered and Authorized by:   Syliva Overman MD   Signed by:   Syliva Overman MD on 08/04/2010   Method used:   Historical   RxID:   [id number] VITAMIN D (ERGOCALCIFEROL) 50000 UNIT CAPS (ERGOCALCIFEROL) one capsule once weekly  #4 x 4   Entered and Authorized by:   Syliva Overman MD   Signed by:   Syliva Overman MD on 08/04/2010   Method used:   Historical   RxID:   [id number] PAROXETINE HCL 12.5 MG XR24H-TAB (PAROXETINE HCL) Take 1 tablet by mouth once a day  #30 x 2   Entered and Authorized by:   Syliva Overman MD   Signed by:   Syliva Overman MD on 08/01/2010   Method used:    Electronically to        [facility] (retail)       [address]       [place], [place]  [address]       Ph: [phone]       Fax: [phone]   RxID:   [id number]    Orders Added: 1)  Est. Patient Level IV [45809] 2)  T-Basic Metabolic Panel [80048-22910] 3)  T-Lipid Profile [80061-22930] 4)  T-CBC w/Diff [98338-25053] 5)  T-TSH [97673-41937] 6)  T-Vitamin D (25-Hydroxy) [90240-97353] 7)  Influenza Vaccine NON MCR [00028] 8)  Radiology Referral [Radiology]   Immunizations Administered:  Influenza Vaccine # 1:    Vaccine Type: Fluvax Non-MCR    Site: right deltoid    Mfr: novartis    Dose: 0.5 ml    Route: IM    Given by: Marijean Niemann  Boothe LPN    Exp. Date: 01/2011    Lot #: [id number]    VIS given: 04/09/10 version given August 01, 2010.   Immunizations Administered:  Influenza Vaccine # 1:    Vaccine Type: Fluvax Non-MCR    Site: right deltoid    Mfr: novartis    Dose: 0.5 ml    Route: IM    Given by: Adella Hare LPN    Exp. Date: 01/2011    Lot #: [id number]    VIS given: 04/09/10 version given August 01, 2010.

## 2010-10-17 NOTE — Progress Notes (Signed)
Summary: MEDICINE MAKING HER HEART BEAT FAST  Phone Note Call from Patient   Summary of Call: [name] HER SOCIAL WORKER CALLED [facility] MESSAGE THAT THE PAXIL IS MAKING HER HEART BEAT FAST AND SHE WILL NOT TAKE IT WANTS TO KNOW WHAT TO DO CALL BACK AT [phone] Initial call taken by: Lind Guest,  October 08, 2010 1:28 PM  Follow-up for Phone Call        if she feels med is bothering her that way, needs to wean off, take one every other day for the next week then stop. she needs an appt with mental health to take care of her nerves Follow-up by: Syliva Overman MD,  October 08, 2010 4:58 PM  Additional Follow-up for Phone Call Additional follow up Details #1::        returned call, left message Additional Follow-up by: Adella Hare LPN,  October 09, 2010 9:39 AM    Additional Follow-up for Phone Call Additional follow up Details #2::    social worker states she refuses to take the med, and is aware she needs to see mental health  states patient is some better Follow-up by: Adella Hare LPN,  October 09, 2010 9:51 AM

## 2010-11-26 ENCOUNTER — Encounter: Payer: Self-pay | Admitting: Family Medicine

## 2010-11-26 ENCOUNTER — Encounter (INDEPENDENT_AMBULATORY_CARE_PROVIDER_SITE_OTHER): Payer: Medicaid Other | Admitting: Family Medicine

## 2010-11-26 DIAGNOSIS — F17200 Nicotine dependence, unspecified, uncomplicated: Secondary | ICD-10-CM

## 2010-11-26 DIAGNOSIS — F4323 Adjustment disorder with mixed anxiety and depressed mood: Secondary | ICD-10-CM

## 2010-11-26 DIAGNOSIS — I10 Essential (primary) hypertension: Secondary | ICD-10-CM

## 2010-12-12 NOTE — Assessment & Plan Note (Signed)
Summary: PHY   Vital Signs:  Patient profile:   50 year old female Menstrual status:  regular LMP:     11/14/2010 Height:      65 inches Weight:      141.75 pounds BMI:     23.67 O2 Sat:      98 % Pulse rate:   99 / minute Pulse rhythm:   regular Resp:     16 per minute BP supine:   150 / 90  (right arm) BP sitting:   142 / 92  (left arm) Cuff size:   regular  Vitals Entered By: Everitt Amber LPN (November 26, 2010 9:28 AM) CC: CPE LMP (date): 11/14/2010     Enter LMP: 11/14/2010 Last PAP Result NEGATIVE FOR INTRAEPITHELIAL LESIONS OR MALIGNANCY.   CC:  CPE.  History of Present Illness: Reports  that  she has been stressed for over 1 year, no warm water in her home, has been bathinhg at her daughter's home, this has caused her to be very stressed and anxious. She was intolerant of paxil, stated it made her heart race. Denies recent fever or chills. Denies sinus pressure, nasal congestion , ear pain or sore throat. Denies chest congestion, or cough productive of sputum. Denies chest pain, palpitations, PND, orthopnea or leg swelling. Denies abdominal pain, nausea, vomitting, diarrhea or constipation. Denies change in bowel movements or bloody stool. Denies dysuria , frequency, incontinence or hesitancy. Denies  joint pain, swelling, or reduced mobility. Denies headaches, vertigo, seizures.    Preventive Screening-Counseling & Management  Alcohol-Tobacco     Smoking Cessation Counseling: yes  Current Medications (verified): 1)  Ibuprofen 800 Mg Tabs (Ibuprofen) .... Take 1 Tablet By Mouth Two Times A Day  For 10 Days Then A Daily As Needed , Start in Am 2)  Vitamin D (Ergocalciferol) 50000 Unit Caps (Ergocalciferol) .... One Capsule Once Weekly 3)  Oscal 500/200 D-3 500-200 Mg-Unit Tabs (Calcium Carbonate-Vitamin D) .... Take 1 Tablet By Mouth Three Times A Day  Allergies (verified): No Known Drug Allergies  Review of Systems      See HPI Eyes:  Denies discharge  and red eye. Derm:  Complains of changes in nail beds, dryness, and lesion(s); thickened skin on soles with callouses and severe onychomycosis. Psych:  Complains of anxiety and mental problems; denies suicidal thoughts/plans, thoughts of violence, and unusual visions or sounds. Heme:  Denies abnormal bruising, bleeding, and enlarge lymph nodes. Allergy:  Complains of seasonal allergies; denies hives or rash and itching eyes.  Physical Exam  General:  Well-developed,well-nourished,in no acute distress; alert,appropriate and cooperative throughout examination HEENT: No facial asymmetry,  EOMI, No sinus tenderness, TM's Clear, oropharynx  pink and moist.   Chest: Clear to auscultation bilaterally.  CVS: S1, S2, No murmurs, No S3.   Abd: Soft, Nontender.  MS: Adequate ROM spine, hips, shoulders and knees.  Ext: No edema.   CNS: CN 2-12 intact, power tone and sensation normal throughout.   Skin: Intact, thickening of the skin on the soles of her feet and callouses Psych: Good eye contact, normal affect.  Memory intact, anxious but not  depressed appearing.    Impression & Recommendations:  Problem # 1:  NICOTINE ADDICTION (ICD-305.1) Assessment Unchanged  Encouraged smoking cessation and discussed different methods for smoking cessation.   Problem # 2:  ESSENTIAL HYPERTENSION (ICD-401.9) Assessment: Unchanged  Her updated medication list for this problem includes:    Amlodipine Besylate 2.5 Mg Tabs (Amlodipine besylate) .Marland Kitchen... Take 1 tablet  by mouth once a day will need to inc med dose if still elevatedat next visit today: 142/92 Prior BP: 154/82 (08/01/2010)  Labs Reviewed: K+: 3.6 (08/01/2010) Creat: : 0.58 (08/01/2010)   Chol: 162 (08/01/2010)   HDL: 71 (08/01/2010)   LDL: 83 (08/01/2010)   TG: 38 (08/01/2010)  Problem # 3:  UNSPECIFIED PSYCHOSIS (ICD-298.9) Assessment: Unchanged  Problem # 4:  ADJ DISORDER WITH MIXED ANXIETY & DEPRESSED MOOD (ICD-309.28) Assessment:  Deteriorated buspar prescribed  Problem # 5:  BACK PAIN, THORACIC REGION (ICD-724.1) Assessment: Unchanged  Her updated medication list for this problem includes:    Ibuprofen 800 Mg Tabs (Ibuprofen) .Marland Kitchen... Take 1 tablet by mouth two times a day  for 10 days then a daily as needed , start in am  Complete Medication List: 1)  Ibuprofen 800 Mg Tabs (Ibuprofen) .... Take 1 tablet by mouth two times a day  for 10 days then a daily as needed , start in am 2)  Vitamin D (ergocalciferol) 50000 Unit Caps (Ergocalciferol) .... One capsule once weekly 3)  Oscal 500/200 D-3 500-200 Mg-unit Tabs (Calcium carbonate-vitamin d) .... Take 1 tablet by mouth three times a day 4)  Amlodipine Besylate 2.5 Mg Tabs (Amlodipine besylate) .... Take 1 tablet by mouth once a day 5)  Buspirone Hcl 5 Mg Tabs (Buspirone hcl) .... Take 1 tablet by mouth two times a day  Patient Instructions: 1)  Pap in 5 to 6 weeks 2)  Tobacco is very bad for your health and your loved ones! You Should stop smoking!. 3)  Stop Smoking Tips: Choose a Quit date. Cut down before the Quit date. decide what you will do as a substitute when you feel the urge to smoke(gum,toothpick,exercise). 4)  It is important that you exercise regularly at least 20 minutes 5 times a week. If you develop chest pain, have severe difficulty breathing, or feel very tired , stop exercising immediately and seek medical attention. 5)  You need to lose weight. Consider a lower calorie diet and regular exercise.  6)  Your blood pressure is high , med is sent in for this also for anxiety. 7)  I hope your living situation improves soon  Prescriptions: IBUPROFEN 800 MG TABS (IBUPROFEN) Take 1 tablet by mouth two times a day  for 10 days then a daily as needed , start in am  #40 Each x 1   Entered by:   Everitt Amber LPN   Authorized by:   Syliva Overman MD   Signed by:   Everitt Amber LPN on 57/84/6962   Method used:   Electronically to        [facility]  (retail)       [address] [place], [place]  [address]       Ph: [phone]       Fax: [phone]   RxID:   [id number] BUSPIRONE HCL 5 MG TABS (BUSPIRONE HCL) Take 1 tablet by mouth two times a day  #60 x 3   Entered and Authorized by:   Syliva Overman MD   Signed by:   Syliva Overman MD on 11/26/2010   Method used:   Electronically to        [facility] (retail)       [address]       [place], [place]  [address]  Ph: [phone]       Fax: [phone]   RxID:   [id number] AMLODIPINE BESYLATE 2.5 MG TABS (AMLODIPINE BESYLATE) Take 1 tablet by mouth once a day  #30 x 2   Entered and Authorized by:   Syliva Overman MD   Signed by:   Syliva Overman MD on 11/26/2010   Method used:   Electronically to        [facility] (retail)       [address]       [place], [place]  [address]       Ph: [phone]       Fax: [phone]   RxID:   [id number]    Orders Added: 1)  Est. Patient Level IV [63016]

## 2011-01-06 ENCOUNTER — Encounter: Payer: Self-pay | Admitting: Family Medicine

## 2011-01-07 ENCOUNTER — Encounter: Payer: Self-pay | Admitting: Family Medicine

## 2011-01-07 ENCOUNTER — Ambulatory Visit (INDEPENDENT_AMBULATORY_CARE_PROVIDER_SITE_OTHER): Payer: Medicaid Other | Admitting: Family Medicine

## 2011-01-07 VITALS — BP 138/84 | HR 90 | Resp 16 | Ht 65.5 in | Wt 145.0 lb

## 2011-01-07 DIAGNOSIS — J310 Chronic rhinitis: Secondary | ICD-10-CM

## 2011-01-07 DIAGNOSIS — Z1211 Encounter for screening for malignant neoplasm of colon: Secondary | ICD-10-CM

## 2011-01-07 DIAGNOSIS — I10 Essential (primary) hypertension: Secondary | ICD-10-CM

## 2011-01-07 MED ORDER — FLUTICASONE PROPIONATE 50 MCG/ACT NA SUSP: 1.0000 | Freq: Every day | NASAL | Status: DC

## 2011-01-07 NOTE — Patient Instructions (Addendum)
CPE in 4.5 months.  You need a colonscopy, to screen for colon cancer, we will refer you to [facility] doctor, Dr Darrick Penna.  Please think about quitting smoking.  This is very important for your health.  Consider setting a quit date, then cutting back or switching brands to prepare to stop.  Also think of the money you will save every day by not smoking.  Quick Tips to Quit Smoking: Fix a date i.e. keep a date in mind from when you would not touch a tobacco product to smoke  Keep yourself busy and block your mind with work loads or reading books or watching movies in malls where smoking is not allowed  Vanish off the things which reminds you about smoking for example match box, or your favorite lighter, or the pipe you used for smoking, or your favorite jeans and shirt with which you used to enjoy smoking, or the club where you used to do smoking  Try to avoid certain people places and incidences where and with whom smoking is a common factor to add on  Praise yourself with some token gifts from the money you saved by stopping smoking  Anti Smoking teams are there to help you. Join their programs  Anti-smoking Gums are there in many medical shops. Try them to quit smoking   Side-effects of Smoking: Disease caused by smoking cigarettes are emphysema, bronchitis, heart failures  Premature death  Cancer is the major side effect of smoking  Heart attacks and strokes are the quick effects of smoking causing sudden death  Some smokers lives end up with limbs amputated  Breathing problem or fast breathing is another side effect of smoking  Due to more intakes of smokes, carbon mono-oxide goes into your brain and other muscles of the body which leads to swelling of the veins and blockage to the air passage to lungs  Carbon monoxide blocks blood vessels which leads to blockage in the flow of blood to different major body organs like heart lungs and thus leads to attacks and deaths  During pregnancy smoking is  very harmful and leads to premature birth of the infant, spontaneous abortions, low weight of the infant during birth  Fat depositions to narrow and blocked blood vessels causing heart attacks  In many cases cigarette smoking caused infertility in men   New med sentt in for allergies

## 2011-01-08 NOTE — Progress Notes (Signed)
  Subjective:    Patient ID: [name], female    DOB: Jul 18, 1961, 50 y.o.   MRN: [id number]  HPI The PT is here for follow up and re-evaluation of chronic medical conditions, medication management and review of recent lab and radiology data.  Preventive health is updated, specifically  Cancer screening, Osteoporosis screening and Immunization.   Questions or concerns regarding consultations or procedures which the PT has had in the interim are  addressed. The PT denies any adverse reactions to current medications since the last visit. She discontinued the buspar and paxil, stating that once she got hot water in her apartment she felt much better with less anxiety.She denies depression There are no new concerns.  There are no specific complaints       Review of Systems Denies recent fever or chills. Denies sinus pressure, nasal congestion, ear pain or sore throat. Denies chest congestion, productive cough or wheezing. Denies chest pains, palpitations, paroxysmal nocturnal dyspnea, orthopnea and leg swelling Denies abdominal pain, nausea, vomiting,diarrhea or constipation.  Denies rectal bleeding or change in bowel movement. Denies dysuria, frequency, hesitancy or incontinence. Denies joint pain, swelling and limitation in mobility. Denies headaches, seizure, numbness, or tingling. Denies depression, anxiety or insomnia. Denies skin break , has chronic severe dermatitis of the soles of her feet.        Objective:   Physical Exam Patient alert and oriented and in no Cardiopulmonary distress.  HEENT: No facial asymmetry, EOMI, no sinus tenderness, TM's clear, Oropharynx pink and moist.  Neck supple no adenopathy.  Chest: Clear to auscultation bilaterally.  CVS: S1, S2 no murmurs, no S3.  ABD: Soft non tender. Bowel sounds normal.  Ext: No edema  MS: Adequate ROM spine, shoulders, hips and knees.  Skin: Intact, eczema of feet  Psych: Good eye contact, normal affect.  Memory intact not anxious or depressed appearing.  CNS: CN 2-12 intact, power, tone and sensation normal throughout.        Assessment & Plan:  1.Hypertension:Controlled, no changes in medication.  2. Anxiety ; resolved. 3. Nicotine use: unchanged, counselled to quit. 4. Dermatitis : unchanged

## 2011-01-23 ENCOUNTER — Encounter: Payer: Self-pay | Admitting: Urgent Care

## 2011-01-23 ENCOUNTER — Ambulatory Visit (INDEPENDENT_AMBULATORY_CARE_PROVIDER_SITE_OTHER): Payer: Medicaid Other | Admitting: Urgent Care

## 2011-01-23 VITALS — BP 146/97 | HR 92 | Temp 98.1°F | Ht 64.0 in | Wt 145.2 lb

## 2011-01-23 DIAGNOSIS — Z1211 Encounter for screening for malignant neoplasm of colon: Secondary | ICD-10-CM

## 2011-01-23 MED ORDER — BISACODYL-PEG-KCL-NABICAR-NACL 5-210 MG-GM PO KIT: PACK | ORAL | Status: DC

## 2011-01-23 NOTE — Progress Notes (Signed)
Cc to PCP 

## 2011-01-23 NOTE — Progress Notes (Signed)
Referring Provider: Syliva Overman, MD Primary Care Physician:  Syliva Overman, MD, MD Primary Gastroenterologist:  Dr. Darrick Penna  Chief Complaint  Patient presents with  . Colon Cancer Screening    HPI:  [name] is a 50 y.o. female here as a referral from Dr. Lodema Hong to set up screening colonoscopy.  [name] has hx of psychotic disorder and we had actually seen her over 3 years ago for chronic abd pain, nausea & vomiting which resolved after treatment for h pylori gastritis w/ prevpac.  She denies any abdominal pain, nausea, vomiting or anorexia.  Denies rectal bleeding or melena.  Denies any GERD symptoms.  Denies any problems w/ constipation or diarrhea.  Past Medical History  Diagnosis Date  . Chronic abdominal pain     resolved  . Hypertension   . Psychosis   . Nicotine addiction   . Anxiety   . H. pylori infection 2/09    s/p prevpac    Past Surgical History  Procedure Date  . Surgery of left middle finger on left hand (childhood )   . Tubal ligation   . Cholecystectomy     Current Outpatient Prescriptions  Medication Sig Dispense Refill  . amLODipine (NORVASC) 2.5 MG tablet Take 2.5 mg by mouth daily.        . calcium-vitamin D (OSCAL 500/200 D-3) 500 MG tablet Take 1 tablet by mouth 2 (two) times daily.        . ergocalciferol (VITAMIN D2) 50000 UNITS capsule Take 50,000 Units by mouth once a week.        . fluticasone (FLONASE) 50 MCG/ACT nasal spray 1 spray by Nasal route daily.  16 g  2  . ibuprofen (ADVIL,MOTRIN) 800 MG tablet Take 800 mg by mouth.          Allergies as of 01/23/2011  . (No Known Allergies)    Family History: There is no known family history of colorectal carcinoma , liver disease, or inflammatory bowel disease.  Problem Relation Age of Onset  . Cancer Mother     History   Social History  . Marital Status: Single    Spouse Name: N/A    Number of Children: 2  . Years of Education: N/A   Occupational History  . disabled     Social History Main Topics  . Smoking status: Current Everyday Smoker -- 0.5 packs/day for 32 years  . Smokeless tobacco: Not on file  . Alcohol Use: No  . Drug Use: No  . Sexually Active: No   Social History Narrative   Lives alone    Review of Systems: Gen: Denies any fever, chills, sweats, anorexia, fatigue, weakness, malaise, weight loss, and sleep disorder CV: Denies chest pain, angina, palpitations, syncope, orthopnea, PND, peripheral edema, and claudication. Resp: Denies dyspnea at rest, dyspnea with exercise, cough, sputum, wheezing, coughing up blood, and pleurisy. GI: Denies vomiting blood, jaundice, and fecal incontinence.   Denies dysphagia or odynophagia. GU : Denies urinary burning, blood in urine, urinary frequency, urinary hesitancy, nocturnal urination, and urinary incontinence. MS: Denies joint pain, limitation of movement, and swelling, stiffness, low back pain, extremity pain. Denies muscle weakness, cramps, atrophy.  Derm: Denies rash, itching, dry skin, hives, moles, warts, or unhealing ulcers.  Psych: Denies depression, anxiety, memory loss, suicidal ideation, hallucinations, paranoia, and confusion. Heme: Denies bruising, bleeding, and enlarged lymph nodes.  Physical Exam: BP 146/97  Pulse 92  Temp(Src) 98.1 F (36.7 C) (Tympanic)  Ht 5\' 4"  (1.626  m)  Wt 145 lb 3.2 oz (65.862 kg)  BMI 24.92 kg/m2  LMP 12/27/2010 General:   Alert,  Well-developed, well-nourished, pleasant and cooperative in NAD.  Accompanied by her social worker today. Head:  Normocephalic and atraumatic. Eyes:  Sclera clear, no icterus.   Conjunctiva pink. Ears:  Normal auditory acuity. Nose:  No deformity, discharge,  or lesions. Mouth:  No deformity or lesions, dentition normal. Neck:  Supple; no masses or thyromegaly. Lungs:  Clear throughout to auscultation.   No wheezes, crackles, or rhonchi. No acute distress. Heart:  Regular rate and rhythm; no murmurs, clicks, rubs,  or  gallops. Abdomen:  Soft, nontender and nondistended. No masses, hepatosplenomegaly or hernias noted. Normal bowel sounds, without guarding, and without rebound.   Rectal:  Deferred until time of colonoscopy.   Msk:  Symmetrical without gross deformities. Normal posture. Pulses:  Normal pulses noted. Extremities:  Without clubbing or edema. Neurologic:  Alert and  oriented x4;  grossly normal neurologically. Skin:  Intact without significant lesions or rashes. Cervical Nodes:  No significant cervical adenopathy. Psych:  Alert and cooperative. Normal mood and affect.

## 2011-01-23 NOTE — Assessment & Plan Note (Signed)
50 y/o black female here to set up average risk screening colonoscopy.  Denies any GI concerns today.

## 2011-01-28 NOTE — Assessment & Plan Note (Signed)
NAME:  [name], [name]                    CHART#:  [id number]   DATE:  11/01/2007                       DOB:  Nov 13, 1960   REQUESTING PHYSICIAN:  Dr. Lodema Hong   REASON FOR CONSULTATION:  Chronic nausea and vomiting.   HISTORY OF PRESENT ILLNESS:  The patient is a 50 year old African  American female with history of psychotic disorder.  She complains of a  several week history of severe abdominal pain, which is throughout her  entire abdomen and radiates through her back.  She also complained of  cramp like low abdominal pain bilaterally.  She states she feels like,  the inside of my stomach is coming out.  She tells me the pain  subsided altogether about three weeks ago.  She was having postprandial  nausea and vomiting.  This all seemed to resolve after she was found to  have a positive H. pylori serology and was treated with a Prevpac  through Dr. Anthony Sar office.  She tells me she is much better now.  She  no longer has regurgitation.  She denies any heartburn or indigestion.  She tells me her appetite is improving although she does not like the  nursing home food.  She reports a 70 pound weight loss over the last 10  months.  However, the aide who works with her and is accompanying her  today, states that it may have been more like a 5 to 10 pound loss.  She  does not remember documenting this significant of a loss.  She denies  any dysphagia or odynophagia.  She does occasionally feel constipated.  She generally has a bowel movement once a day.  She denies any dysuria,  hematuria, increased urinary frequency.  She tells me she has been  eating much healthier and she is trying to avoid particular foods that  seem to cause abdominal pain.   PAST MEDICAL/SURGICAL HISTORY:  1. Psychotic disorder.  2. Hypertension.   CURRENT MEDICATIONS:  1. Megestrol 80 mg daily.  2. Potassium chloride 20 mEq daily.  3. Gabapentin 100 mg at night.  4. Benicar 20 mg daily.  5. Multivitamin daily.  6. Promethazine 12.5 mg p.r.n.  7. Dulcolax p.r.n.  8. Hydrocodone 5/500 mg p.r.n.  9. Zofran 4 mg p.r.n.  10.Stool softeners p.r.n.   ALLERGIES:  No kwon drug allergies.   FAMILY HISTORY:  There is no known family history of colorectal  carcinoma, liver, or chronic GI problems.   SOCIAL HISTORY:  Ms. Fogal is single.  She has two healthy children.  She  lives at [facility].  She does have a 30 pack year history of tobacco  use.  Denies alcohol or drug use.   REVIEW OF SYSTEMS:  GYN:  Her last menstrual period was last week.  Otherwise negative review of systems.   PHYSICAL EXAMINATION:  VITAL SIGNS:  Weight 129 pounds.  Height 64  inches.  Temperature 98.5.  Blood pressure 110/68, pulse 80.  GENERAL:  She is a well developed, well nourished [place] American  female in no acute distress.  She is alert and oriented x3.  She is  accompanied by one of her aides today.HEENT:  Sclerae are clear and  nonicteric.  Conjunctivae pink.  Oropharynx and moist without any  lesions.NECK:  Supple without any mass or  thyromegaly.CHEST:  Heart  regular rate and rhythm.  Normal S1, S2 without murmurs, clicks, rubs or  gallops.LUNGS:  Clear to auscultation bilaterally.ABDOMEN:  Positive  bowel sounds x4.  No bruits auscultated.  Soft, nontender, nondistended  without palpable mass or  hepatosplenomegaly.  No rebound tenderness or  guarding.RECTAL:  No external lesions were visualized.  Good Picture  tone.  No internal mass palpated.  Small amount of light brown stool was  obtained from the vault which was Hemoccult negative.EXTREMITIES:  Without clubbing or edema.   Laboratory studies from Dr. Anthony Sar office 10/05/2007:  She had a  normal TSH and nonreactive RPR.  She had normal LFTs, normal MET 7 and  normal CBC.  She had a positive H. pylori of 1.6, which was treated.   IMPRESSION:  The patient is a 50 year old female with a couple weeks'  history of persistent nausea and vomiting along with  generalized  abdominal pain with essentially unremarkable lab work except for a  positive H. pylori serology.  She was treated with Prevpac.  Has done  well since treatment.  She has had no further nausea, vomiting.  Abdominal pain is diminishing.  She had a significant stated weight  loss.  However, this is not confirmed by her caregivers.  She also has  psychotic disorder and I would query whether this is playing a role in  some of her symptomatology as well.  I suspect her pain may have been  related to H. pylori gastritis.  Cannot rule out peptic ulcer disease or  other causes without further evaluation.  It is reassuring how ver that  her symptoms of nausea, vomiting and abdominal pain have virtually  resolved at this point.   PLAN:  1. Would like her to benign omeprazole 20 mg daily #30 with 1 refill.  2. Weight check in one month with follow up office visit with Dr.      Cira Servant.  3. She is going to call if her symptoms recur in the interim.      Otherwise will reassess in one month and see if she has had any      recurrence of symptoms and at that point, will determine whether      she needs EGD, colonoscopy, or further imaging, ie CT.   We would like to thank Dr. Lodema Hong for allowing [place] to participate in the  care of this patient.       Lorenza Burton, N.P.  Electronically Signed     Kassie Mends, M.D.  Electronically Signed    KJ/MEDQ  D:  11/02/2007  T:  11/02/2007  Job:  [id number]   cc:   Milus Mallick. Lodema Hong, M.D.

## 2011-01-28 NOTE — Op Note (Signed)
[name], [name]                   ACCOUNT NO.:  [id number]   MEDICAL RECORD NO.:  [id number]          PATIENT TYPE:  OBV   LOCATION:  A337                          FACILITY:  [facility]   PHYSICIAN:  Tilford Pillar, MD      DATE OF BIRTH:  08/27/1961   DATE OF PROCEDURE:  08/30/2008  DATE OF DISCHARGE:                               OPERATIVE REPORT   PREOPERATIVE DIAGNOSIS:  Acute cholecystitis.   POSTOPERATIVE DIAGNOSIS:  Acute cholecystitis.   PROCEDURE:  Laparoscopic cholecystectomy.   SURGEON:  Tilford Pillar, MD   ANESTHESIA:  General endotracheal, local anesthetic, 0.5% Marcaine  plain.   SPECIMEN:  Gallbladder.   ESTIMATED BLOOD LOSS:  Minimal.   INDICATIONS:  The patient is an unfortunate 50 year old female who  presented to my office with a history of increasing episodes of right  upper quadrant epigastric pain.  She is somewhat of a poor historian due  to what appears to me a slight MRDD history.  She had presented  initially with a caseworker who was able to help with some of additional  information.  She has had symptoms that are consistent with  cholecystitis, and her most recent episode apparently occurring during  her visit as an outpatient in my office.  She did have a CT evaluation  of the abdomen and pelvis, which demonstrated a distended, thickened-  wall gallbladder with the suspicion of obstructing stone.  No evidence  of any biliary tree dilatation was noted on that.  I did discuss the  risks, benefits, and alternatives of laparoscopic, possible open  cholecystectomy at length with the patient in the presence of the  caseworker including, but not limited to the risk of bleeding,  infection, small-bowel injury, common bile duct injury, bile leak, as  well as a possibility of intraoperative cardiac and pulmonary events.  The patient's questions and concerns were addressed and the patient was  consented for the planned procedure.   OPERATION:  The patient was  taken to the operating room.  She was placed  in the supine position on the operating room table.  At this time, the  general anesthetic was administered.  Once the patient was asleep, she  was endotracheally intubated by anesthesia.  At this time, her abdomen  was prepped with DuraPrep solution and draped in standard fashion.  A  stab incision was created supraumbilically with 11-blade scalpel.  Additional dissection down through the subcuticular tissue was carried  out using a Kocher clamp, which was utilized to grasp the anterior  abdominal wall fascia and move this anteriorly.  A Veress needle was  inserted.  Saline drop test was utilized to confirm intraperitoneal  placement.  Then, pneumoperitoneum was initiated.  Once sufficient  pneumoperitoneum was obtained, an 11-mm trocar was inserted over the  laparoscope allowing visualization of the trocar entering into the  peritoneal cavity.  At this time, the inner cannula was removed.  The  laparoscope was reinserted.  There was no evidence of any trocar or  Veress needle placement injury.  At this time, the remaining trocars  were placed with an 11-mm trocar in the epigastrium, a 5-mm trocar was  placed in the midline between the two 11-mm and the 5-mm trocar in the  right lateral abdominal wall.  These were all placed in similar fashion  making a stab incision and placement of trocars under direct  visualization.  The patient was then placed into a reverse Trendelenburg  left lateral decubitus position.  The fundus of the gallbladder was  tentatively grasped so that it could be lifted up and over the right  lobe of the liver.  However, due to its __________ distention, it was  not possible to grasp the gallbladder.  At this point, a __________  needle was brought into the field.  This was inserted down the 11-mm  trocar.  It was utilized to aspirate the gallbladder.  It was clear that  the patient did have hydrops of the gallbladder as  the returning bilious  fluid was a clear brown-green colored fluid.  Upon aspirating the  gallbladder, the fundus was grasped.  The gallbladder was elevated up  and over the right lobe of the liver.  Due to the inflammation  surrounding the gallbladder, the omental adhesions were bluntly stripped  using a Art gallery manager.  Additionally, there was a large stone that  was noted to be impacted at the infundibulum of the gallbladder making  grasping the infundibulum extremely difficult.  I was able to carefully  manipulate the infundibulum of the gallbladder and with continued  careful dissection, I was able to close the cystic duct that had entered  into the infundibulum.  Three EndoClips were placed proximally, one  distally, and cystic duct was divided between the 2  distal clips.  Similarly, a cystic artery was identified, 2 EndoClips were placed  proximally, one distally, and cystic artery was divided between the two  most distal clips.  During this dissection, the common bile duct could  be seen and was well away from the dissection field, but was obviously  tented during retraction of the infundibulum of the gallbladder.  Upon  division of both the cystic duct and the cystic artery, the gallbladder  was freed from the gallbladder fossa using electrocautery.  Once the  gallbladder was free, it was placed in an EndoCatch bag and was placed  up and over the right lobe of the liver.  At this time, I did inspect  the gallbladder fossa.   Using electrocautery, hemostasis was obtained in the gallbladder fossa.  The surface was quite inflamed and raw due to the inflamed nature of the  gallbladder.  Hemostasis was obtained.  A piece of Surgicel was placed  in to gallbladder fossa.  The Endoclips were evaluated.  There was no  evidence of any hemorrhage or bile leaking from the placed endoclips,  and at this time, I turned my attention to closure.   Using a Endoclose suture passing device,  I passed a 2-0 Vicryl suture  through both the 11-mm trocar sites.  With these sutures in place, I  attempted to remove the gallbladder through the epigastric trocar site  in the EndoCatch bag.  During this removal, I did have to enlarge the  trocar site with both the combination of blunt and sharp dissection in  order to adequately open the trocar site enough to remove the  gallbladder.  Additionally during this, a small tear was created in the  EndoCatch bag but the EndoCatch bag was removed completely with the  gallbladder remaining  in the EndoCatch bag.  There was no evidence of  any retained EndoCatch bag within the trocar site.  At this time, a  final inspection of the intra-abdominal cavity was carried out with a  laparoscope.  I did close the epigastric trocar site by securing the  Vicryl suture, then hemorrhage from this site.  At this point, the  pneumoperitoneum was evacuated.  The remaining 3 trocars were removed.  The Vicryl suture of the umbilicus was secured.  Local anesthetic was  instilled and a 4-0 Monocryl was utilized to reapproximate the skin  edges at all 4 trocar sites.  Skin was washed and dried with moist dry  towel.  Benzoin was applied around the incision.  Half inch Steri-Strips  was placed.  The drapes  were removed.  The patient was allowed to come out of general aesthetic,  and she was transferred to the postanesthetic care unit in stable  condition.  At the conclusion of the procedure, all instrument, sponge,  and needle counts were correct.  The patient tolerated the procedure  well.      Tilford Pillar, MD  Electronically Signed     BZ/MEDQ  D:  08/30/2008  T:  08/31/2008  Job:  [id number]   cc:   Milus Mallick. Lodema Hong, M.D.  Fax: [phone]

## 2011-01-28 NOTE — Assessment & Plan Note (Signed)
NAME:  [name], [name]                    CHART#:  [id number]   DATE:                                   DOB:  09-04-1961   REFERRING PHYSICIAN:  Dr. Milus Mallick. Simpson.   PROBLEMS:  1. Nausea, vomiting, abdominal pain.  2. Psychotic disorder.  3. Hypertension.   SUBJECTIVE:  [name] is a 50 year old female who is seen as a return  patient visit.  She was initially seen for nausea, vomiting and  abdominal pain.  She had a positive H. pylori serology in January 2009,  and was treated with a Prev-Pak.  She continues to take Omeprazole  daily.  She denies any nausea, vomiting or abdominal pain.  She took all  of her antibiotics.  Her bowel movements are regular and occasionally  needs to use MiraLax.  She has not used any Zofran or Phenergan as  needed.   OBJECTIVE:  VITAL SIGNS:  Weight 131 pounds (up 2 pounds since February  2009), height 5 feet 4 inches, BMI 22.5 (healthy), temperature 99.1  degrees, blood pressure 100/60, pulse 80.GENERAL:  She is in no apparent  distress, alert and oriented x4.LUNGS:  Clear to auscultation  bilaterally.CARDIOVASCULAR:  A regular rhythm.  ABDOMEN:  Bowel sounds present, soft, nontender, nondistended.   ASSESSMENT:  [name] is a 50 year old female who appears to have  nausea, vomiting and abdominal pain which likely was secondary to  Helicobacter pylori gastritis. Symptoms are now resolved. Thank you for  allowing me to see [name] in consultation.  My recommendations will  follow.   RECOMMENDATIONS:  1. She should continue Omeprazole 20 mg daily.  2. She has a follow-up appointment to see me in six months.  3. She should have age-appropriate colon cancer screening at age 53.      I briefly discussed the procedure with her today.       Kassie Mends, M.D.  Electronically Signed     SM/MEDQ  D:  11/30/2007  T:  11/30/2007  Job:  [id number]   cc:   Milus Mallick. Lodema Hong, M.D.

## 2011-01-31 NOTE — Assessment & Plan Note (Signed)
NAME:  [name], [name]                    CHART#:  [id number]   DATE:                                   DOB:  [birth year]   ADDENDUM:   PLAN:  She was given Miralax 17 g daily, #527, with no refills, for  constipation.       Lorenza Burton, N.P.  Electronically Signed     KJ/MEDQ  D:  11/02/2007  T:  11/02/2007  Job:  [id number]

## 2011-01-31 NOTE — H&P (Signed)
[name], [name]                           ACCOUNT NO.:  [id number]   MEDICAL RECORD NO.:  [id number]                   PATIENT TYPE:  IPS   LOCATION:  0306                                 FACILITY:  [facility]   PHYSICIAN:  Geoffery Lyons, M.D.                   DATE OF BIRTH:  [birth year]   DATE OF ADMISSION:  03/09/2003  DATE OF DISCHARGE:  03/13/2003                         PSYCHIATRIC ADMISSION ASSESSMENT   IDENTIFYING INFORMATION:  This is a 50 year old African-American female who  is single.  This is an involuntary admission.   HISTORY OF PRESENT ILLNESS:  This patient was petitioned by [facility] for suicidal thoughts with a plan to jump off her  apartment balcony to be with her mother and grandmother who are currently  deceased.  The patient has been noncompliant with her outpatient treatment  and has been having auditory hallucinations with commands to harm himself.  She has also expressed homicidal thoughts toward the case manager at mental  health, believing that the Effexor that they gave her may have caused her  auditory and visual hallucination.  The therapist of mental health has  indicated that they are aware that their staff members are the target of her  homicidal thoughts.  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  She has had significant financial problems since that time.  The patient today is an unreliable historian.  She is very tangential and  disorganized in her thoughts.  She has apparently been, according to the  history, decompensated for the past two weeks, having obviously suicidal and  homicidal thoughts and auditory hallucinations.   PAST PSYCHIATRIC HISTORY:  The patient is currently on a five day  sponsorship by [facility], Donita Brooks is her  therapist.  The patient has no prior suicide attempt.  This is  her first  admission to [facility].   SOCIAL HISTORY:  This is a single female.  She has one daughter who is 38  years old.  She says that her daughter's father tried to give her food in  exchange for sex and this in turn angers her.  It is not clear at this point  what her living situation is.  She has reported that she has two children.  In addition to the 30 year old daughter, she also has an 33 year old son.  She has expressed the fact that her aunt has encouraged her to come to the  hospital and wants her to take her medicine so she can get well.   FAMILY HISTORY:  Remarkable for a brother with a history of unspecified  mental illness and she also reports some family history of substance abuse  which is not clear.   ALCOHOL AND DRUG HISTORY:  The  patient has endorsed a history of alcohol  abuse, but reports no current use of alcohol or other street drugs.   MEDICAL HISTORY:  The patient has been followed by the well woman clinic at  the health department in [place] and also in the past by Dr. Sudie Bailey.  Medical problems are hypertension in the past, but not currently.  Past  medical history is also remarkable for bilateral tubal ligation and surgical  repair of the lacerated third finger as a child on her left hand.   MEDICATIONS:  1. Trazodone 100 mg p.o. q.h.s.  2. Effexor XR 150 mg q.a.m.  3. Hydrochlorothiazide in the past, but not currently for her hypertension.   DRUG ALLERGIES:  None.   POSITIVE PHYSICAL FINDINGS:   GENERAL:  This is a well nourished, well developed female who is somewhat  guarded today.  She appears to be her stated age of 9.  VITAL SIGNS:  On admission, temperature 98.6.  Pulse 80.  Respirations 18.  Blood pressure 152/99.  She was 5 ft. 2 inches tall, weighed 137 pounds with  a BMI calculated at 24.  SKIN:  Dark in tone, smooth.  No remarkable features.  No signs of self  mutilation.  HEENT:  Head is normocephalic  and atraumatic.  ENT - PERRLA.  Sclerae are  nonicteric.  Oropharynx noninjected.  No rhinorrhea.  NECK:  Supple.  No thyromegaly.  No lymphadenopathy.  CARDIOVASCULAR:  S1 and S2 are heard.  No clicks, murmurs, or gallops.  LUNGS:  Clear to auscultation.  ABDOMEN:  Flat, soft, nontender.  No masses appreciated.  GENITALIA:  Deferred.  MUSCULOSKELETAL:  No erythema or swelling of any joints.  Posture is  upright.  Spine grossly straight.  NEURO:  Cranial nerves II-XII are intact.  EOM is intact.  No nystagmus.  Motor movements are smooth.  No tremor.  No signs of ETF.  Sensory appears  grossly intact.  Gait is grossly normal with normal arm swing.  Facial  symmetry is present.  Grip strength equal bilaterally.  Romberg without  finding.   DIAGNOSTIC STUDIES:  Revealed negative urine pregnancy test.  Her CBC was  within normal limits.  Metabolic panel revealed hypokalemia with a potassium  level of 3.1 and electrolytes were normal.  Potassium 3.1.  Thyroid panel is  currently pending.   MENTAL STATUS EXAM:  Fully alert female with a guarded affect.  She  cooperates with some encouragement and is redirectable, but is quite  disorganized with some thought agitation.  Speech reveals mild pressure.  She is very tangential.  Mood is irritable and guarded.  Thought process  reveals mild thought agitation.  She is having positive suicidal thoughts,  thinking that if she jumps from her apartment that would be an easy way to  go and her problems would be over with.  She is also homicidal, having  homicidal thoughts of shooting a therapist with a gun, but whether she has  access to guns is not clear.  Thought process is also quite paranoid and  guarded.  She is having tangential thoughts and is disorganized, but does  not overtly appear to be having auditory hallucinations at this time.   DIAGNOSES:   AXIS I: 1. 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  2. 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   AXIS II:  No diagnosis.   AXIS III:  No diagnosis.  AXIS IV:  Severe financial stress because of her loss of work and the  economic problems resulting from this.   AXIS V:  Current 20; past year 95.   PLAN:  To involuntarily admit the patient to our intensive care program for  close observation with q.15 minute checks in place.  We are starting her on  Risperdal 0.25 mg b.i.d. and 0.5 mg q.h.s. to control her agitated thoughts  and disorganization.  We have also made available Cogentin 2 mg IM p.r.n.  q.4h p.r.n. for dystonia.  We are going to start her on a nicotine patch 21  mg p.o. q.d. for smoking cessation, since we are a nonsmoking unit.  Meanwhile, we will monitor her blood pressure closely.  She does not need  any medication for hypertension at this time and we are going to  hold her  Effexor in the morning since she has complained at this point that this may  be contributing to her increased hallucinations and we are going to give her  some Risperdal and see how clear she is in the morning and re-evaluate the  use of the Effexor.  Estimated length of stay is five days.     Margaret A. Scott, N.P.                   Geoffery Lyons, M.D.    MAS/MEDQ  D:  03/27/2003  T:  03/27/2003  Job:  [id number]

## 2011-01-31 NOTE — Discharge Summary (Signed)
[name], [name]                           ACCOUNT NO.:  [id number]   MEDICAL RECORD NO.:  [id number]                   PATIENT TYPE:  IPS   LOCATION:  0306                                 [facility]ACILITY:  [facility]   PHYSICIAN:  Geoffery Lyons, M.D.                   DATE OF BIRTH:  Feb 25, 1961   DATE OF ADMISSION:  03/09/2003  DATE OF DISCHARGE:  03/13/2003                                 DISCHARGE SUMMARY   CHIEF COMPLAINT AND PRESENT ILLNESS:  This was the first admission to [facility] for this 50 year old African-American female,  single, voluntarily admitted.  She was petitioned by [facility].  She had suicidal thoughts with a plan to jump off her  apartment balcony to be with her mother and grandmother who are currently  deceased.  She was noncompliant with her outpatient treatment.  She was  having auditory hallucinations with commands to harm herself.  She expressed  some homicidal thoughts toward the case manager of mental health, believing  that the Effexor they gave her may have caused her auditory and visual  hallucinations.  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   PAST PSYCHIATRIC HISTORY:  Five days before, she was seen at [facility].  This was the first time at [facility].  She had no previous treatment.   SUBSTANCE ABUSE HISTORY:  She endorsed a history of alcohol abuse; no  current use of alcohol or street drugs.   PAST MEDICAL HISTORY:  Bilateral tubal ligation.   MEDICATIONS:  1. Trazodone 100 mg at bedtime.  2. Effexor XR 150 mg in the morning.  3. Hydrochlorothiazide; not actively using.   PHYSICAL EXAMINATION:  Physical examination was performed, failed to show  any acute findings.   MENTAL STATUS EXAM:  Mental status exam revealed an alert female, guarded  affect, cooperative, some encouragement,  redirectable, quite disorganized,  some thought agitation.  Speech revealed mild pressure, very tangential.  Mood: Irritable and guarded.  Thought processes revealed mild thought  agitation.  She had suicidal thoughts thinking that if she jumped from her  apartment that will be an easy way to go, and her problems would be over  with.  She had thoughts of shooting a therapist with a gun, but she  minimized and claimed to have no gun.   ADMISSION DIAGNOSES:   AXIS I:  1. Psychotic disorder, not otherwise specified.  2. Alcohol abuse, in remission.   AXIS II:  No diagnosis.   AXIS III:  No diagnosis.   AXIS IV:  Moderate.   AXIS V:  Global assessment of functioning upon admission 20, highest global  assessment of functioning in the last year 68.   LABORATORY DATA:  Other laboratory workup: CBC was  within normal limits.  Blood chemistries were within normal limits.  Thyroid profile was within  normal limits.   HOSPITAL COURSE:  She was admitted and started in intensive individual and  group psychotherapy.  She was given some Ativan as needed for anxiety.  She  was given Risperdal, Effexor XR 150 mg per day, and trazodone 100 mg at  bedtime for sleep.  Risperdal was placed at 0.25 mg twice a day and 0.5 mg  at night.  Risperdal was later increased to 0.5 mg twice a day and 0.75 mg  at night.  Initially, she was endorsing depression, overwhelmed, hearing  voices.  She was upset with mental health.  She was still suspicious about  the medications that were given her.  She was disorganized, tangential,  segmented, positive for auditory hallucinations.  By June 27 after the  increase in Risperdal, she said she was doing better.  She able to sleep,  thinking clearly.  The voices had decreased.  Objectively, she was more  relaxed, cooperative, more organized thinking, no evidence of active  delusions or hallucinations.  On June 28, she was much better, mood  improved, affect bright,  increased insight, no delusional ideas, no  hallucinations, willing to go to mental health and pursue further treatment.   DISCHARGE DIAGNOSES:   AXIS I:  Major depression with psychotic features.   AXIS II:  No diagnosis.   AXIS III:  No diagnosis.   AXIS IV:  Moderate.   AXIS V:  Global assessment of functioning upon discharge 50.   DISCHARGE MEDICATIONS:  1. Effexor XR 150 mg per day.  2. Trazodone 100 mg at bedtime for sleep.  3. Risperdal 0.5 mg one twice a day and two at night.   FOLLOW UP:  She was to follow up at [facility].                                               Geoffery Lyons, M.D.    IL/MEDQ  D:  04/05/2003  T:  04/06/2003  Job:  [id number]

## 2011-02-07 ENCOUNTER — Ambulatory Visit (HOSPITAL_COMMUNITY)
Admission: RE | Admit: 2011-02-07 | Discharge: 2011-02-07 | Disposition: A | Discharge status: Home / Self Care | Payer: Medicaid Other | Source: Ambulatory Visit | Attending: Gastroenterology | Admitting: Gastroenterology

## 2011-02-07 ENCOUNTER — Encounter: Payer: Medicaid Other | Admitting: Gastroenterology

## 2011-02-07 DIAGNOSIS — Z79899 Other long term (current) drug therapy: Secondary | ICD-10-CM | POA: Insufficient documentation

## 2011-02-07 DIAGNOSIS — I10 Essential (primary) hypertension: Secondary | ICD-10-CM | POA: Insufficient documentation

## 2011-02-07 DIAGNOSIS — F17200 Nicotine dependence, unspecified, uncomplicated: Secondary | ICD-10-CM | POA: Insufficient documentation

## 2011-02-07 DIAGNOSIS — Z1211 Encounter for screening for malignant neoplasm of colon: Secondary | ICD-10-CM

## 2011-02-07 DIAGNOSIS — K648 Other hemorrhoids: Secondary | ICD-10-CM

## 2011-02-07 HISTORY — PX: COLONOSCOPY: SHX174

## 2011-03-12 NOTE — Op Note (Signed)
  [name], PITA                   ACCOUNT NO.:  [id number]  MEDICAL RECORD NO.:  [id number]           PATIENT TYPE:  O  LOCATION:  DAYP                          FACILITY:  [facility]  PHYSICIAN:  Jonette Eva, M.D.     DATE OF BIRTH:  September 27, 1960  DATE OF PROCEDURE:  02/07/2011 DATE OF DISCHARGE:                              OPERATIVE REPORT   REFERRING PROVIDER:  Milus Mallick. Lodema Hong, MD  PROCEDURE:  Colonoscopy.  INDICATION FOR EXAMINATION:  Ms. Leisinger is a 50 year old female who presents for average risk colon cancer screening.  She has a history of chronic abdominal pain and psychosis, nicotine addiction, and anxiety. She was treated for H. pylori in February 2009.  She does not drink any alcohol.  FINDINGS:  Slightly tortuous colon.  Otherwise normal colon without evidence of polyps, masses, inflammatory changes, diverticular AVMs. Moderate internal hemorrhoids and prominent anal papilla.  Otherwise normal retroflexed view of the rectum.RECOMMENDATIONS: 1. Screening colonoscopy in 10 years. 2. She should follow a high-fiber diet.  She is given a handout on     high-fiber diet and hemorrhoids.  MEDICATIONS: 1. Demerol 100 mg IV. 2. Versed 7 mg IV.  PROCEDURE TECHNIQUE:  Physical exam was performed.  Informed consent was obtained from the patient after explaining benefits, risks and alternatives to the procedure.  The patient was connected to monitor and placed in left lateral position.  Continuous oxygen was provided by nasal cannula and IV medicine was administered through an indwelling cannula.  After administration of sedation and rectal exam, the patient's rectum was intubated and the scope was advanced under direct visualization to the cecum.  Scope was removed slowly by careful examining the color, texture, anatomy, and integrity mucosa on the way out.  The patient was recovered in endoscopy and discharged home in satisfactory condition.     Jonette Eva,  M.D.     SF/MEDQ  D:  02/07/2011  T:  02/08/2011  Job:  [id number]  Electronically Signed by Jonette Eva M.D. on 03/12/2011 01:52:06 PM

## 2011-04-17 ENCOUNTER — Telehealth: Payer: Self-pay | Admitting: Family Medicine

## 2011-04-17 ENCOUNTER — Other Ambulatory Visit: Payer: Self-pay | Admitting: Family Medicine

## 2011-04-18 MED ORDER — IBUPROFEN 800 MG PO TABS: ORAL_TABLET | ORAL | Status: DC

## 2011-04-18 NOTE — Telephone Encounter (Signed)
refilled 

## 2011-04-18 NOTE — Telephone Encounter (Signed)
pls refill x 2 

## 2011-04-23 ENCOUNTER — Telehealth: Payer: Self-pay | Admitting: Family Medicine

## 2011-04-23 NOTE — Telephone Encounter (Signed)
Patient has runny nose, clear drainage, some head congestion, no fever Patient insists she has a cold and will try dayquil otc

## 2011-06-04 LAB — DIFFERENTIAL
Basophils Absolute: 0.4 — ABNORMAL HIGH
Basophils Relative: 2 — ABNORMAL HIGH
Neutro Abs: 8.5 — ABNORMAL HIGH
Neutrophils Relative %: 55

## 2011-06-04 LAB — CBC
HCT: 44.9
Hemoglobin: 14.8
MCV: 88.6
RDW: 14.4

## 2011-06-04 LAB — PREGNANCY, URINE: Preg Test, Ur: NEGATIVE

## 2011-06-04 LAB — COMPREHENSIVE METABOLIC PANEL
Alkaline Phosphatase: 100
BUN: 11
Creatinine, Ser: 0.7
Glucose, Bld: 130 — ABNORMAL HIGH
Potassium: 3.6
Total Bilirubin: 0.3
Total Protein: 8

## 2011-06-04 LAB — URINALYSIS, ROUTINE W REFLEX MICROSCOPIC
Ketones, ur: 40 — AB
Leukocytes, UA: NEGATIVE
Nitrite: NEGATIVE
Protein, ur: 30 — AB
Urobilinogen, UA: 0.2

## 2011-06-10 ENCOUNTER — Encounter: Payer: Self-pay | Admitting: Family Medicine

## 2011-06-11 ENCOUNTER — Other Ambulatory Visit: Payer: Self-pay | Admitting: Family Medicine

## 2011-06-11 ENCOUNTER — Other Ambulatory Visit (HOSPITAL_COMMUNITY)
Admission: RE | Admit: 2011-06-11 | Discharge: 2011-06-11 | Disposition: A | Discharge status: Home / Self Care | Payer: Medicaid Other | Source: Ambulatory Visit | Attending: Family Medicine | Admitting: Family Medicine

## 2011-06-11 ENCOUNTER — Ambulatory Visit (INDEPENDENT_AMBULATORY_CARE_PROVIDER_SITE_OTHER): Payer: Medicaid Other | Admitting: Family Medicine

## 2011-06-11 ENCOUNTER — Encounter: Payer: Self-pay | Admitting: Family Medicine

## 2011-06-11 VITALS — BP 130/84 | HR 90 | Resp 16 | Ht 65.5 in | Wt 140.0 lb

## 2011-06-11 DIAGNOSIS — I10 Essential (primary) hypertension: Secondary | ICD-10-CM

## 2011-06-11 DIAGNOSIS — Z0000 Encounter for general adult medical examination without abnormal findings: Secondary | ICD-10-CM

## 2011-06-11 DIAGNOSIS — Z139 Encounter for screening, unspecified: Secondary | ICD-10-CM

## 2011-06-11 DIAGNOSIS — R5381 Other malaise: Secondary | ICD-10-CM

## 2011-06-11 DIAGNOSIS — H6120 Impacted cerumen, unspecified ear: Secondary | ICD-10-CM

## 2011-06-11 DIAGNOSIS — E559 Vitamin D deficiency, unspecified: Secondary | ICD-10-CM

## 2011-06-11 DIAGNOSIS — Z13220 Encounter for screening for lipoid disorders: Secondary | ICD-10-CM

## 2011-06-11 DIAGNOSIS — Z1211 Encounter for screening for malignant neoplasm of colon: Secondary | ICD-10-CM

## 2011-06-11 DIAGNOSIS — R5383 Other fatigue: Secondary | ICD-10-CM

## 2011-06-11 DIAGNOSIS — F17200 Nicotine dependence, unspecified, uncomplicated: Secondary | ICD-10-CM

## 2011-06-11 DIAGNOSIS — Z124 Encounter for screening for malignant neoplasm of cervix: Secondary | ICD-10-CM

## 2011-06-11 DIAGNOSIS — Z01419 Encounter for gynecological examination (general) (routine) without abnormal findings: Secondary | ICD-10-CM | POA: Insufficient documentation

## 2011-06-11 DIAGNOSIS — J40 Bronchitis, not specified as acute or chronic: Secondary | ICD-10-CM

## 2011-06-11 MED ORDER — BENZONATATE 100 MG PO CAPS: 100.0000 mg | ORAL_CAPSULE | Freq: Four times a day (QID) | ORAL | Status: DC | PRN

## 2011-06-11 MED ORDER — PENICILLIN V POTASSIUM 500 MG PO TABS: 500.0000 mg | ORAL_TABLET | Freq: Three times a day (TID) | ORAL | Status: AC

## 2011-06-11 NOTE — Assessment & Plan Note (Signed)
Smoking less, counseled to quit

## 2011-06-11 NOTE — Assessment & Plan Note (Addendum)
Controlled, no change in medication. EKG baseline obtained shows NSR with no ischemic changes

## 2011-06-11 NOTE — Progress Notes (Signed)
  Subjective:    Patient ID: [name], female    DOB: 07-Nov-1960, 50 y.o.   MRN: [id number]  HPI The PT is here for annual exam .  Preventive health is updated, specifically  Cancer screening and Immunization.   Questions or concerns regarding consultations or procedures which the PT has had in the interim are  addressed. The PT denies any adverse reactions to current medications since the last visit.  Currently smoking on avg 15 ciggs per day 1 week h/o excessive cough and chest congestion, sputum is green, no documented fever, has had intermittent chills. Seeing podiatry regularly for foot care.      Review of Systems See HPI Denies recent fever or chills. Denies sinus pressure, nasal congestion, ear pain or sore throat.  Denies chest pains, palpitations and leg swelling Denies abdominal pain, nausea, vomiting,diarrhea or constipation.   Denies dysuria, frequency, hesitancy or incontinence. Denies joint pain, swelling and limitation in mobility. Denies headaches, seizures, numbness, or tingling. Denies depression, anxiety or insomnia. Excessive corns, calluses and cracking of soles of the feet        Objective:   Physical Exam Pleasant well nourished female, alert and oriented x 3, in no cardio-pulmonary distress. Afebrile. HEENT No facial trauma or asymetry. Sinuses non tender.  EOMI, PERTL, fundoscopic exam is normal, TM right partially occluded,  left occluded by cerumen. Oropharynx moist, no exudate, poor  dentition. Neck: supple, no adenopathy,JVD or thyromegaly.No bruits.  Chest: Decreased air entry, bilateral crackles, no wheezes Non tender to palpation  Breast: No asymetry,no masses. No nipple discharge or inversion. No axillary or supraclavicular adenopathy  Cardiovascular system; Heart sounds normal,  S1 and  S2 ,no S3.  No murmur, or thrill. Apical beat not displaced Peripheral pulses normal.  Abdomen: Soft, non tender, no organomegaly or  masses. No bruits. Bowel sounds normal. No guarding, tenderness or rebound.  Rectal:  No mass. Guaiac negative stool.  GU: External genitalia normal. No lesions. Vaginal canal normal.No discharge. Uterus normal size, no adnexal masses, no cervical motion or adnexal tenderness.  Musculoskeletal exam: Full ROM of spine, hips , shoulders and knees. No deformity ,swelling or crepitus noted. No muscle wasting or atrophy.   Neurologic: Cranial nerves 2 to 12 intact. Power, tone ,sensation and reflexes normal throughout. No disturbance in gait. No tremor.  Skin: Corns , calluses and thickened skin on soles Psych; Normal mood and affect. Judgement and concentration normal        Assessment & Plan:

## 2011-06-11 NOTE — Patient Instructions (Addendum)
F/u in 4.5 months.  Return in 3 weeks for irrigation of right ear and flu vaccine  You are being treated for bronchitis , medication is sent in.  It is important that you take both the calcium and vit D as prescribed.   Fasting labs early November.  Mammogram is due in November , we will schedule    CXR and EKG today  Please think about quitting smoking.  This is very important for your health.  Consider setting a quit date, then cutting back or switching brands to prepare to stop.  Also think of the money you will save every day by not smoking.  Quick Tips to Quit Smoking: Fix a date i.e. keep a date in mind from when you would not touch a tobacco product to smoke  Keep yourself busy and block your mind with work loads or reading books or watching movies in malls where smoking is not allowed  Vanish off the things which reminds you about smoking for example match box, or your favorite lighter, or the pipe you used for smoking, or your favorite jeans and shirt with which you used to enjoy smoking, or the club where you used to do smoking  Try to avoid certain people places and incidences where and with whom smoking is a common factor to add on  Praise yourself with some token gifts from the money you saved by stopping smoking  Anti Smoking teams are there to help you. Join their programs  Anti-smoking Gums are there in many medical shops. Try them to quit smoking   Side-effects of Smoking: Disease caused by smoking cigarettes are emphysema, bronchitis, heart failures  Premature death  Cancer is the major side effect of smoking  Heart attacks and strokes are the quick effects of smoking causing sudden death  Some smokers lives end up with limbs amputated  Breathing problem or fast breathing is another side effect of smoking  Due to more intakes of smokes, carbon mono-oxide goes into your brain and other muscles of the body which leads to swelling of the veins and blockage to the air  passage to lungs  Carbon monoxide blocks blood vessels which leads to blockage in the flow of blood to different major body organs like heart lungs and thus leads to attacks and deaths  During pregnancy smoking is very harmful and leads to premature birth of the infant, spontaneous abortions, low weight of the infant during birth  Fat depositions to narrow and blocked blood vessels causing heart attacks  In many cases cigarette smoking caused infertility in men

## 2011-06-11 NOTE — Assessment & Plan Note (Signed)
[  pt to use wax softener and return for ear irrigation

## 2011-06-11 NOTE — Assessment & Plan Note (Signed)
Antibiotic prescribed, and cxr obtained

## 2011-06-14 ENCOUNTER — Telehealth: Payer: Self-pay | Admitting: *Deleted

## 2011-06-14 ENCOUNTER — Encounter: Payer: Self-pay | Admitting: *Deleted

## 2011-06-14 NOTE — Telephone Encounter (Signed)
Mailed normal pap letter 

## 2011-06-14 NOTE — Telephone Encounter (Signed)
Message copied by Diamantina Monks on Sat Jun 14, 2011  2:10 PM ------      Message from: Syliva Overman MD E      Created: Fri Jun 13, 2011 10:41 PM       Please advise the patient that pap is normal.

## 2011-06-17 ENCOUNTER — Ambulatory Visit (HOSPITAL_COMMUNITY)
Admission: RE | Admit: 2011-06-17 | Discharge: 2011-06-17 | Disposition: A | Discharge status: Home / Self Care | Payer: Medicaid Other | Source: Ambulatory Visit | Attending: Family Medicine | Admitting: Family Medicine

## 2011-06-17 DIAGNOSIS — F17200 Nicotine dependence, unspecified, uncomplicated: Secondary | ICD-10-CM | POA: Insufficient documentation

## 2011-06-17 DIAGNOSIS — J40 Bronchitis, not specified as acute or chronic: Secondary | ICD-10-CM

## 2011-06-17 DIAGNOSIS — R059 Cough, unspecified: Secondary | ICD-10-CM | POA: Insufficient documentation

## 2011-06-17 DIAGNOSIS — R05 Cough: Secondary | ICD-10-CM | POA: Insufficient documentation

## 2011-06-17 DIAGNOSIS — I10 Essential (primary) hypertension: Secondary | ICD-10-CM | POA: Insufficient documentation

## 2011-06-19 ENCOUNTER — Encounter: Payer: Self-pay | Admitting: Family Medicine

## 2011-06-20 LAB — COMPREHENSIVE METABOLIC PANEL
Alkaline Phosphatase: 100 U/L (ref 39–117)
BUN: 5 mg/dL — ABNORMAL LOW (ref 6–23)
Calcium: 9.5 mg/dL (ref 8.4–10.5)
Glucose, Bld: 89 mg/dL (ref 70–99)
Potassium: 2.9 mEq/L — ABNORMAL LOW (ref 3.5–5.1)
Total Protein: 6.9 g/dL (ref 6.0–8.3)

## 2011-06-20 LAB — DIFFERENTIAL
Basophils Relative: 1 % (ref 0–1)
Monocytes Relative: 2 % — ABNORMAL LOW (ref 3–12)
Neutro Abs: 4.7 10*3/uL (ref 1.7–7.7)
Neutrophils Relative %: 58 % (ref 43–77)

## 2011-06-20 LAB — CBC
HCT: 36.9 % (ref 36.0–46.0)
Hemoglobin: 12.4 g/dL (ref 12.0–15.0)
MCHC: 33.6 g/dL (ref 30.0–36.0)
MCV: 92.5 fL (ref 78.0–100.0)
Platelets: 284 10*3/uL (ref 150–400)
RDW: 14.7 % (ref 11.5–15.5)
WBC: 6.2 10*3/uL (ref 4.0–10.5)

## 2011-06-20 LAB — URINALYSIS, ROUTINE W REFLEX MICROSCOPIC
Glucose, UA: NEGATIVE mg/dL
Leukocytes, UA: NEGATIVE
Nitrite: NEGATIVE
Protein, ur: NEGATIVE mg/dL
Urobilinogen, UA: 0.2 mg/dL (ref 0.0–1.0)

## 2011-06-20 LAB — BASIC METABOLIC PANEL
BUN: 9 mg/dL (ref 6–23)
Calcium: 9.7 mg/dL (ref 8.4–10.5)
Creatinine, Ser: 0.53 mg/dL (ref 0.4–1.2)
GFR calc non Af Amer: >60 mL/min (ref >60)

## 2011-06-20 LAB — URINE MICROSCOPIC-ADD ON

## 2011-07-02 ENCOUNTER — Ambulatory Visit (INDEPENDENT_AMBULATORY_CARE_PROVIDER_SITE_OTHER): Payer: Medicaid Other

## 2011-07-02 VITALS — BP 160/90 | Wt 139.0 lb

## 2011-07-02 DIAGNOSIS — I10 Essential (primary) hypertension: Secondary | ICD-10-CM

## 2011-07-02 DIAGNOSIS — J310 Chronic rhinitis: Secondary | ICD-10-CM

## 2011-07-02 MED ORDER — AMLODIPINE BESYLATE 2.5 MG PO TABS: 5.0000 mg | ORAL_TABLET | Freq: Every day | ORAL | Status: DC

## 2011-07-02 MED ORDER — AMLODIPINE BESYLATE 5 MG PO TABS: 5.0000 mg | ORAL_TABLET | Freq: Every day | ORAL | Status: DC

## 2011-07-02 MED ORDER — FLUTICASONE PROPIONATE 50 MCG/ACT NA SUSP: 1.0000 | Freq: Every day | NASAL | Status: DC

## 2011-07-02 MED ORDER — IBUPROFEN 800 MG PO TABS: ORAL_TABLET | ORAL | Status: DC

## 2011-07-03 NOTE — Progress Notes (Signed)
Amlodipine was increased to 5mg  daily. Patient wanted rx of the 2.5mg  take 2 daily.

## 2011-07-07 ENCOUNTER — Other Ambulatory Visit: Payer: Self-pay | Admitting: Family Medicine

## 2011-07-07 ENCOUNTER — Ambulatory Visit (INDEPENDENT_AMBULATORY_CARE_PROVIDER_SITE_OTHER): Payer: Medicaid Other

## 2011-07-07 VITALS — BP 126/84 | Wt 144.0 lb

## 2011-07-07 DIAGNOSIS — Z23 Encounter for immunization: Secondary | ICD-10-CM

## 2011-07-07 DIAGNOSIS — H6120 Impacted cerumen, unspecified ear: Secondary | ICD-10-CM

## 2011-07-07 NOTE — Progress Notes (Signed)
Attempted to irrigate the right ear but as soon as I started she c/o some tenderness but wanted me to continue. After a few more seconds she wanted me to stop because it became painful. Dr Lodema Hong to refer to ENT

## 2011-07-24 ENCOUNTER — Ambulatory Visit (INDEPENDENT_AMBULATORY_CARE_PROVIDER_SITE_OTHER): Payer: Medicaid Other | Admitting: Otolaryngology

## 2011-07-24 DIAGNOSIS — H6120 Impacted cerumen, unspecified ear: Secondary | ICD-10-CM

## 2011-07-24 DIAGNOSIS — H902 Conductive hearing loss, unspecified: Secondary | ICD-10-CM

## 2011-08-18 ENCOUNTER — Ambulatory Visit (HOSPITAL_COMMUNITY)
Admission: RE | Admit: 2011-08-18 | Discharge: 2011-08-18 | Disposition: A | Discharge status: Home / Self Care | Payer: Medicaid Other | Source: Ambulatory Visit | Attending: Family Medicine | Admitting: Family Medicine

## 2011-08-18 DIAGNOSIS — Z1231 Encounter for screening mammogram for malignant neoplasm of breast: Secondary | ICD-10-CM | POA: Insufficient documentation

## 2011-08-18 DIAGNOSIS — Z139 Encounter for screening, unspecified: Secondary | ICD-10-CM

## 2011-09-01 ENCOUNTER — Other Ambulatory Visit: Payer: Self-pay | Admitting: Family Medicine

## 2011-11-06 ENCOUNTER — Ambulatory Visit: Payer: Medicaid Other | Admitting: Family Medicine

## 2011-11-27 ENCOUNTER — Encounter: Payer: Self-pay | Admitting: Family Medicine

## 2011-11-27 ENCOUNTER — Ambulatory Visit (INDEPENDENT_AMBULATORY_CARE_PROVIDER_SITE_OTHER): Payer: Medicaid Other | Admitting: Family Medicine

## 2011-11-27 VITALS — BP 140/90 | HR 73 | Resp 16 | Ht 65.5 in | Wt 147.0 lb

## 2011-11-27 DIAGNOSIS — Z13220 Encounter for screening for lipoid disorders: Secondary | ICD-10-CM

## 2011-11-27 DIAGNOSIS — F17200 Nicotine dependence, unspecified, uncomplicated: Secondary | ICD-10-CM

## 2011-11-27 DIAGNOSIS — I10 Essential (primary) hypertension: Secondary | ICD-10-CM

## 2011-11-27 DIAGNOSIS — F4323 Adjustment disorder with mixed anxiety and depressed mood: Secondary | ICD-10-CM

## 2011-11-27 DIAGNOSIS — E559 Vitamin D deficiency, unspecified: Secondary | ICD-10-CM

## 2011-11-27 DIAGNOSIS — M546 Pain in thoracic spine: Secondary | ICD-10-CM

## 2011-11-27 DIAGNOSIS — R5381 Other malaise: Secondary | ICD-10-CM

## 2011-11-27 MED ORDER — AMLODIPINE BESYLATE 5 MG PO TABS: 5.0000 mg | ORAL_TABLET | Freq: Every day | ORAL | Status: DC

## 2011-11-27 NOTE — Progress Notes (Signed)
  Subjective:    Patient ID: [name], female    DOB: 11/20/1960, 51 y.o.   MRN: [id number]  HPI The PT is here for follow up and re-evaluation of chronic medical conditions, medication management and review of any available recent lab and radiology data.  Preventive health is updated, specifically  Cancer screening and Immunization.   Questions or concerns regarding consultations or procedures which the PT has had in the interim are  addressed. The PT denies any adverse reactions to current medications since the last visit.  There are no new concerns.  There are no specific complaints       Review of Systems    See HPI Denies recent fever or chills. Denies sinus pressure, nasal congestion, ear pain or sore throat. Denies chest congestion, productive cough or wheezing. Denies chest pains, palpitations and leg swelling Denies abdominal pain, nausea, vomiting,diarrhea or constipation.   Denies dysuria, frequency, hesitancy or incontinence. Chronic  joint pain, swelling and mild  limitation in mobility. Denies headaches, seizures, numbness, or tingling. Denies uncontrolled depression, anxiety or insomnia.    Objective:   Physical Exam Patient alert and oriented and in no cardiopulmonary distress.  HEENT: No facial asymmetry, EOMI, no sinus tenderness,  oropharynx pink and moist.  Neck supple no adenopathy.  Chest: Clear to auscultation bilaterally.Decreased throughout  CVS: S1, S2 no murmurs, no S3.  ABD: Soft non tender. Bowel sounds normal.  Ext: No edema  MS: Adequate ROM spine, shoulders, hips and knees.  Skin:severe tinea pedis and bilateral foot caluses, also dermatitis of the hands  Psych: Good eye contact,blunted  affect. Memory intact not anxious or depressed appearing.  CNS: CN 2-12 intact, power, tone and sensation normal throughout.        Assessment & Plan:

## 2011-11-27 NOTE — Patient Instructions (Signed)
F/U in 4.5 months  Please call if you need me before You absolutely NEED to get get blood work, please get it today.  Cbc, chem 7, lipid, tSH and Vitamin D  It is important that you exercise regularly at least 30 minutes 5 times a week. If you develop chest pain, have severe difficulty breathing, or feel very tired, stop exercising immediately and seek medical attention   You need to try to stop smoking  Remember, the blood pressure pill is a new strength, 5mg  amlodipine, take ONE daily

## 2011-11-28 LAB — BASIC METABOLIC PANEL
CO2: 24 mEq/L (ref 19–32)
Calcium: 9.8 mg/dL (ref 8.4–10.5)
Chloride: 103 mEq/L (ref 96–112)
Creat: 0.65 mg/dL (ref 0.50–1.10)
Glucose, Bld: 72 mg/dL (ref 70–99)

## 2011-11-28 LAB — CBC WITH DIFFERENTIAL/PLATELET
Eosinophils Absolute: 0.2 10*3/uL (ref 0.0–0.7)
Eosinophils Relative: 2 % (ref 0–5)
HCT: 36.7 % (ref 36.0–46.0)
Hemoglobin: 12.3 g/dL (ref 12.0–15.0)
Lymphocytes Relative: 52 % — ABNORMAL HIGH (ref 12–46)
Lymphs Abs: 4 10*3/uL (ref 0.7–4.0)
MCH: 29.6 pg (ref 26.0–34.0)
MCV: 88.4 fL (ref 78.0–100.0)
Monocytes Absolute: 0.5 10*3/uL (ref 0.1–1.0)
Monocytes Relative: 7 % (ref 3–12)
RBC: 4.15 MIL/uL (ref 3.87–5.11)

## 2011-11-28 LAB — LIPID PANEL
LDL Cholesterol: 80 mg/dL (ref 0–99)
VLDL: 9 mg/dL (ref 0–40)

## 2011-11-28 LAB — VITAMIN D 25 HYDROXY (VIT D DEFICIENCY, FRACTURES): Vit D, 25-Hydroxy: 27 ng/mL — ABNORMAL LOW (ref 30–89)

## 2011-12-08 NOTE — Assessment & Plan Note (Addendum)
Uncontrolled , dose increase on medication 

## 2011-12-08 NOTE — Assessment & Plan Note (Signed)
Cessation counseling done, counseled to quit

## 2011-12-08 NOTE — Assessment & Plan Note (Signed)
Currently stable and on no medication

## 2011-12-08 NOTE — Assessment & Plan Note (Signed)
Ibuprofen, as needed.

## 2012-01-02 ENCOUNTER — Other Ambulatory Visit: Payer: Self-pay | Admitting: Family Medicine

## 2012-01-02 ENCOUNTER — Telehealth: Payer: Self-pay | Admitting: Family Medicine

## 2012-01-02 DIAGNOSIS — M79671 Pain in right foot: Secondary | ICD-10-CM

## 2012-01-02 NOTE — Telephone Encounter (Signed)
Ok to refer to [facility] I will put in referral

## 2012-02-12 ENCOUNTER — Ambulatory Visit (INDEPENDENT_AMBULATORY_CARE_PROVIDER_SITE_OTHER): Payer: Medicaid Other | Admitting: Family Medicine

## 2012-02-12 ENCOUNTER — Encounter: Payer: Self-pay | Admitting: Family Medicine

## 2012-02-12 VITALS — BP 144/82 | HR 74 | Resp 18 | Ht 65.5 in | Wt 143.0 lb

## 2012-02-12 DIAGNOSIS — I10 Essential (primary) hypertension: Secondary | ICD-10-CM

## 2012-02-12 DIAGNOSIS — M069 Rheumatoid arthritis, unspecified: Secondary | ICD-10-CM

## 2012-02-12 DIAGNOSIS — F17200 Nicotine dependence, unspecified, uncomplicated: Secondary | ICD-10-CM

## 2012-02-12 DIAGNOSIS — F29 Unspecified psychosis not due to a substance or known physiological condition: Secondary | ICD-10-CM

## 2012-02-12 NOTE — Progress Notes (Signed)
  Subjective:    Patient ID: [name], female    DOB: 11-24-1960, 51 y.o.   MRN: [id number]  HPI Pt in with her caseworker with the chief concern of the need to be referred to a rheumatologist for treatment for severe rheumatoid arthritis. She recently had a new podiatry eval, and was advised of this,her toes are severely deformed, bent to the extent that she ambulates on her ben t toe, surgery is indicated but there is a question of coverage reportedly. Pt trying to stop  smoking but inconsistent with her success, averages 10 to 12 per day. Immunization , and cancer screening are also updated  Review of Systems See HPI Denies recent fever or chills. Denies sinus pressure, nasal congestion, ear pain or sore throat. Denies chest congestion, productive cough or wheezing. Denies chest pains, palpitations and leg swelling Denies abdominal pain, nausea, vomiting,diarrhea or constipation.   Denies dysuria, frequency, hesitancy or incontinence.  Denies headaches, seizures, numbness, or tingling. Denies uncontrolled  depression, anxiety or insomnia.Refuses medication Denies skin break down or rash.        Objective:   Physical Exam  Patient alert and oriented and in no cardiopulmonary distress.  HEENT: No facial asymmetry, EOMI, no sinus tenderness,  oropharynx pink and moist.  Neck supple no adenopathy.  Chest: Clear to auscultation bilaterally.decreased air entry throughout  CVS: S1, S2 no murmurs, no S3.  ABD: Soft non tender. Bowel sounds normal.  Ext: No edema  MS: Adequate ROM spine, shoulders, hips and knees. Marked deformity of fingers and toes Skin: calusses on feet with dry skin, no breakdown at this time  Psych: Good eye contact, . Memory mildly impaired not anxious or depressed appearing.  CNS: CN 2-12 intact, power, tone and sensation normal throughout.       Assessment & Plan:

## 2012-02-12 NOTE — Patient Instructions (Addendum)
cpe 09/27 or after, call if you need me before  You are referred to a rheumatologist for evaluation , you do have evidence of severe rheumatologic disease in both fingers and toes   Please stop smoking this will improve your health  Ibuprofen will be refilled

## 2012-02-12 NOTE — Assessment & Plan Note (Addendum)
Uncontrolled, no med change, focus on lifestyle change to improve bP

## 2012-02-14 ENCOUNTER — Other Ambulatory Visit: Payer: Self-pay | Admitting: Family Medicine

## 2012-02-15 NOTE — Assessment & Plan Note (Signed)
Unchanged , cessation counseling done 

## 2012-02-15 NOTE — Assessment & Plan Note (Signed)
Severe deformity of hands and feet , [facility] will refer for specialist eval and assistance

## 2012-02-15 NOTE — Assessment & Plan Note (Signed)
Currently stable, pt on no medication

## 2012-03-13 ENCOUNTER — Other Ambulatory Visit: Payer: Self-pay | Admitting: Family Medicine

## 2012-03-22 ENCOUNTER — Ambulatory Visit: Payer: Medicaid Other | Admitting: Family Medicine

## 2012-03-29 ENCOUNTER — Ambulatory Visit (INDEPENDENT_AMBULATORY_CARE_PROVIDER_SITE_OTHER): Payer: Medicaid Other | Admitting: Family Medicine

## 2012-03-29 ENCOUNTER — Encounter: Payer: Self-pay | Admitting: Family Medicine

## 2012-03-29 VITALS — BP 110/80 | HR 96 | Resp 16 | Ht 65.5 in | Wt 133.0 lb

## 2012-03-29 DIAGNOSIS — R319 Hematuria, unspecified: Secondary | ICD-10-CM

## 2012-03-29 DIAGNOSIS — Z113 Encounter for screening for infections with a predominantly sexual mode of transmission: Secondary | ICD-10-CM

## 2012-03-29 DIAGNOSIS — R300 Dysuria: Secondary | ICD-10-CM

## 2012-03-29 DIAGNOSIS — F17200 Nicotine dependence, unspecified, uncomplicated: Secondary | ICD-10-CM

## 2012-03-29 DIAGNOSIS — I10 Essential (primary) hypertension: Secondary | ICD-10-CM

## 2012-03-29 DIAGNOSIS — B009 Herpesviral infection, unspecified: Secondary | ICD-10-CM

## 2012-03-29 DIAGNOSIS — N3000 Acute cystitis without hematuria: Secondary | ICD-10-CM

## 2012-03-29 DIAGNOSIS — N760 Acute vaginitis: Secondary | ICD-10-CM

## 2012-03-29 LAB — POCT URINALYSIS DIPSTICK: Spec Grav, UA: 1.025

## 2012-03-29 NOTE — Patient Instructions (Addendum)
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

## 2012-03-29 NOTE — Progress Notes (Signed)
  Subjective:    Patient ID: Guadlupe Spanish, female    DOB: 1961/03/17, 51 y.o.   MRN: [id number]  HPI Reports sexual intimacy since the fall of last year with itching , rash  And vaginal d/c c/o a lot of burning and itching Reports blood from buttock C/o malodorous urine  Which is dark and has had chills   Review of Systems See HPI Denies recent fever or chills. Denies sinus pressure, nasal congestion, ear pain or sore throat. Denies chest congestion, productive cough or wheezing. Denies chest pains, palpitations and leg swelling Denies abdominal pain, nausea, vomiting,diarrhea or constipation.    Chronic  joint pain,denies  swelling and limitation in mobility. Denies headaches, seizures, numbness, or tingling. Denies depression, anxiety or insomnia.        Objective:   Physical Exam  Patient alert and oriented and in no cardiopulmonary distress.  HEENT: No facial asymmetry, EOMI, no sinus tenderness,  oropharynx pink and moist.  Neck supple no adenopathy.  Chest: Clear to auscultation bilaterally.  CVS: S1, S2 no murmurs, no S3.  ABD: Soft non tender. Bowel sounds normal. Pelvic: white d/c , skin breakdown noted on labia majora, no cervical motion or adnexal tenderness Ext: No edema  MS: Adequate ROM spine, shoulders, hips and knees.  Skin: Intact, no ulcerations or rash noted.Mild perianal hyperpigmentation, does not appear pathologic  Psych: Good eye contact, blunted affect affect. Memory impaired, mild mental retardation.mildly  anxious not depressed appearing.  CNS: CN 2-12 intact, power, tone and sensation normal throughout.       Assessment & Plan:

## 2012-03-30 DIAGNOSIS — B009 Herpesviral infection, unspecified: Secondary | ICD-10-CM | POA: Insufficient documentation

## 2012-03-30 DIAGNOSIS — N3000 Acute cystitis without hematuria: Secondary | ICD-10-CM | POA: Insufficient documentation

## 2012-03-30 HISTORY — DX: Herpesviral infection, unspecified: B00.9

## 2012-03-30 LAB — HSV 2 ANTIBODY, IGG: HSV 2 Glycoprotein G Ab, IgG: 7.94 IV — ABNORMAL HIGH

## 2012-03-30 LAB — RPR

## 2012-03-30 NOTE — Assessment & Plan Note (Signed)
Controlled, no change in medication  

## 2012-03-30 NOTE — Assessment & Plan Note (Signed)
Unchanged , cessation counseling done 

## 2012-03-30 NOTE — Assessment & Plan Note (Signed)
CCUA in office negative for infection 

## 2012-03-31 ENCOUNTER — Encounter: Payer: Self-pay | Admitting: Family Medicine

## 2012-03-31 ENCOUNTER — Other Ambulatory Visit: Payer: Self-pay | Admitting: Family Medicine

## 2012-03-31 LAB — WET PREP BY MOLECULAR PROBE: Candida species: NEGATIVE

## 2012-03-31 LAB — URINALYSIS W MICROSCOPIC + REFLEX CULTURE
Casts: NONE SEEN
Crystals: NONE SEEN
Glucose, UA: NEGATIVE mg/dL
Leukocytes, UA: NEGATIVE
pH: 6 (ref 5.0–8.0)

## 2012-03-31 LAB — GC/CHLAMYDIA PROBE AMP, GENITAL
Chlamydia, DNA Probe: NEGATIVE
GC Probe Amp, Genital: NEGATIVE

## 2012-04-01 ENCOUNTER — Other Ambulatory Visit: Payer: Self-pay

## 2012-04-01 MED ORDER — METRONIDAZOLE 500 MG PO TABS: 500.0000 mg | ORAL_TABLET | Freq: Two times a day (BID) | ORAL | Status: DC

## 2012-04-07 ENCOUNTER — Other Ambulatory Visit: Payer: Self-pay

## 2012-04-07 DIAGNOSIS — B009 Herpesviral infection, unspecified: Secondary | ICD-10-CM

## 2012-04-07 MED ORDER — ACYCLOVIR 400 MG PO TABS: 400.0000 mg | ORAL_TABLET | Freq: Three times a day (TID) | ORAL | Status: DC

## 2012-04-13 ENCOUNTER — Ambulatory Visit: Payer: Medicaid Other | Admitting: Family Medicine

## 2012-06-15 ENCOUNTER — Encounter: Payer: Medicaid Other | Admitting: Family Medicine

## 2012-06-17 ENCOUNTER — Encounter: Payer: Self-pay | Admitting: Family Medicine

## 2013-01-25 ENCOUNTER — Other Ambulatory Visit: Payer: Self-pay | Admitting: Family Medicine

## 2013-01-25 DIAGNOSIS — Z139 Encounter for screening, unspecified: Secondary | ICD-10-CM

## 2013-02-14 ENCOUNTER — Ambulatory Visit (HOSPITAL_COMMUNITY): Payer: Medicaid Other

## 2013-02-28 ENCOUNTER — Other Ambulatory Visit (HOSPITAL_COMMUNITY)
Admission: RE | Admit: 2013-02-28 | Discharge: 2013-02-28 | Disposition: A | Discharge status: Home / Self Care | Payer: Medicaid Other | Source: Ambulatory Visit | Attending: Family Medicine | Admitting: Family Medicine

## 2013-02-28 ENCOUNTER — Ambulatory Visit (INDEPENDENT_AMBULATORY_CARE_PROVIDER_SITE_OTHER): Payer: Medicaid Other | Admitting: Family Medicine

## 2013-02-28 ENCOUNTER — Encounter: Payer: Self-pay | Admitting: Family Medicine

## 2013-02-28 VITALS — BP 170/82 | HR 70 | Resp 18 | Ht 65.5 in | Wt 123.1 lb

## 2013-02-28 DIAGNOSIS — F4323 Adjustment disorder with mixed anxiety and depressed mood: Secondary | ICD-10-CM

## 2013-02-28 DIAGNOSIS — Z01419 Encounter for gynecological examination (general) (routine) without abnormal findings: Secondary | ICD-10-CM | POA: Insufficient documentation

## 2013-02-28 DIAGNOSIS — Z113 Encounter for screening for infections with a predominantly sexual mode of transmission: Secondary | ICD-10-CM

## 2013-02-28 DIAGNOSIS — F17200 Nicotine dependence, unspecified, uncomplicated: Secondary | ICD-10-CM

## 2013-02-28 DIAGNOSIS — N760 Acute vaginitis: Secondary | ICD-10-CM | POA: Insufficient documentation

## 2013-02-28 DIAGNOSIS — Z139 Encounter for screening, unspecified: Secondary | ICD-10-CM

## 2013-02-28 DIAGNOSIS — Z0000 Encounter for general adult medical examination without abnormal findings: Secondary | ICD-10-CM | POA: Insufficient documentation

## 2013-02-28 DIAGNOSIS — R5381 Other malaise: Secondary | ICD-10-CM

## 2013-02-28 DIAGNOSIS — R5383 Other fatigue: Secondary | ICD-10-CM

## 2013-02-28 DIAGNOSIS — Z124 Encounter for screening for malignant neoplasm of cervix: Secondary | ICD-10-CM

## 2013-02-28 DIAGNOSIS — I10 Essential (primary) hypertension: Secondary | ICD-10-CM

## 2013-02-28 DIAGNOSIS — F29 Unspecified psychosis not due to a substance or known physiological condition: Secondary | ICD-10-CM

## 2013-02-28 DIAGNOSIS — Z1211 Encounter for screening for malignant neoplasm of colon: Secondary | ICD-10-CM

## 2013-02-28 DIAGNOSIS — Z1151 Encounter for screening for human papillomavirus (HPV): Secondary | ICD-10-CM | POA: Insufficient documentation

## 2013-02-28 DIAGNOSIS — R634 Abnormal weight loss: Secondary | ICD-10-CM

## 2013-02-28 MED ORDER — AMLODIPINE BESYLATE 5 MG PO TABS: 5.0000 mg | ORAL_TABLET | Freq: Every day | ORAL | Status: DC

## 2013-02-28 MED ORDER — PAROXETINE HCL 10 MG PO TABS
10.0000 mg | ORAL_TABLET | ORAL | Status: DC
Start: 2013-02-28 — End: 2014-11-29

## 2013-02-28 NOTE — Progress Notes (Signed)
  Subjective:    Patient ID: [name], female    DOB: 03-Jun-1961, 52 y.o.   MRN: [id number]  HPI Pt in after 1 year for annual exam States she still lives n her own, has been on no medication, but now wants to go back on her blood pressure med, and may also need something for anxiety. Denies hallucinations, suicidal or homicidal ideation Has only been going intermittently to podiatry for toenail care, needs appt now Has noted increased vaginal d/c denies urinary symptoms   Review of Systems .See HPI Denies recent fever or chills. Denies sinus pressure, nasal congestion, ear pain or sore throat. Denies chest congestion, productive cough or wheezing. Denies chest pains, palpitations and leg swelling Denies abdominal pain, nausea, vomiting,diarrhea or constipation.   Denies dysuria, frequency, hesitancy or incontinence. Denies joint pain, swelling and limitation in mobility. Denies headaches, seizures, numbness, or tingling.  Thickened skin on soles, also long thickened nails needing trimming      Objective:   Physical Exam  Pleasant well nourished female, alert and oriented x 3, in no cardio-pulmonary distress. Afebrile. HEENT No facial trauma or asymetry. Sinuses non tender.  EOMI, PERTL, fundoscopic exam is normal, no hemorhage or exudate.  External ears normal, tympanic membranes clear. Oropharynx moist, no exudate, poor  dentition. Neck: supple, no adenopathy,JVD or thyromegaly.No bruits.  Chest: Clear to ascultation bilaterally.No crackles or wheezes. Non tender to palpation  Breast: No asymetry,no masses. No nipple discharge or inversion. No axillary or supraclavicular adenopathy  Cardiovascular system; Heart sounds normal,  S1 and  S2 ,no S3.  No murmur, or thrill. Apical beat not displaced Peripheral pulses normal.  Abdomen: Soft, non tender, no organomegaly or masses. No bruits. Bowel sounds normal. No guarding, tenderness or rebound.  Rectal:  No  mass. Guaiac negative stool.  GU: External genitalia normal. No lesions. Vaginal canal erythematous.copious thick cream vaginal disharge. Uterus normal size, no adnexal masses, no cervical motion or adnexal tenderness.  Musculoskeletal exam: Full ROM of spine, hips , shoulders and knees. No deformity ,swelling or crepitus noted. No muscle wasting or atrophy.   Neurologic: Cranial nerves 2 to 12 intact. Power, tone ,sensation and reflexes normal throughout. No disturbance in gait. No tremor.  Skin: Thickened skin on soles with long nails and onychomycosis Psych; Normal mood and affect. Judgement and concentration normal       Assessment & Plan:

## 2013-02-28 NOTE — Assessment & Plan Note (Signed)
Still has not had colonoscopy, due to unexpalained weight loss, needs this done will refer again,if she agrees

## 2013-02-28 NOTE — Assessment & Plan Note (Signed)
Unchanged, pt states she is cutting back , trying to quit. Patient counseled for approximately 5 minutes regarding the health risks of ongoing nicotine use, specifically all types of cancer, heart disease, stroke and respiratory failure. The options available for help with cessation ,the behavioral changes to assist the process, and the option to either gradully reduce usage  Or abruptly stop.is also discussed. Pt is also encouraged to set specific goals in number of cigarettes used daily, as well as to set a quit date.

## 2013-02-28 NOTE — Assessment & Plan Note (Signed)
Uncontrolled, pt to resume med as before, she states she is now ready also

## 2013-02-28 NOTE — Patient Instructions (Addendum)
F/u in 2 month  Amlodipine for blood pressure and paxil for nerves are prescribe  You need fasting labs in 2 weeks, cbc, lipid, cmp, TSH , HIV   You will be contacted with result of pap and vaginal discharge  Please continue to cut back on smoking , you need too quit

## 2013-02-28 NOTE — Assessment & Plan Note (Signed)
Denies hallucinations, but reports anxiety.  Paxil prescribed

## 2013-02-28 NOTE — Assessment & Plan Note (Signed)
Annual exam as documented.  Fasting labs past due.  Specimens sent for vaginal testing

## 2013-03-02 ENCOUNTER — Other Ambulatory Visit: Payer: Self-pay | Admitting: Family Medicine

## 2013-03-10 ENCOUNTER — Other Ambulatory Visit: Payer: Self-pay

## 2013-03-10 MED ORDER — METRONIDAZOLE 500 MG PO TABS: ORAL_TABLET | ORAL | Status: DC

## 2013-04-05 ENCOUNTER — Encounter: Payer: Self-pay | Admitting: Gastroenterology

## 2013-04-13 ENCOUNTER — Encounter: Payer: Self-pay | Admitting: Gastroenterology

## 2013-04-22 ENCOUNTER — Ambulatory Visit: Payer: Medicaid Other | Admitting: Gastroenterology

## 2013-05-02 ENCOUNTER — Ambulatory Visit: Payer: Medicaid Other | Admitting: Family Medicine

## 2013-05-02 ENCOUNTER — Encounter: Payer: Self-pay | Admitting: Family Medicine

## 2013-06-28 ENCOUNTER — Ambulatory Visit: Payer: Medicaid Other | Admitting: Family Medicine

## 2014-05-16 ENCOUNTER — Telehealth: Payer: Self-pay | Admitting: *Deleted

## 2014-05-16 NOTE — Telephone Encounter (Signed)
Pt called stating it has been a while since she has seen Dr. Moshe Cipro pt wanted something for blood pressure. I made pt aware that in her past history she hasn't seen Dr. Moshe Cipro that she has no showed and canceled all of her appts. I made pt aware that we are not going to be able to call her anything in for blood pressure since she has not been seen. I also made pt aware that I will talk with the nurse and my office manager to see if she can be seen since she has missed so many appts. Please advise

## 2014-05-16 NOTE — Telephone Encounter (Signed)
Will directed to office manager.

## 2014-10-23 ENCOUNTER — Encounter: Payer: Self-pay | Admitting: Family Medicine

## 2014-11-09 ENCOUNTER — Ambulatory Visit: Payer: Medicaid Other | Admitting: Family Medicine

## 2014-11-29 ENCOUNTER — Ambulatory Visit (INDEPENDENT_AMBULATORY_CARE_PROVIDER_SITE_OTHER): Payer: Medicaid Other | Admitting: Family Medicine

## 2014-11-29 ENCOUNTER — Encounter: Payer: Self-pay | Admitting: Family Medicine

## 2014-11-29 VITALS — BP 180/100 | HR 99 | Resp 16 | Ht 66.0 in | Wt 126.0 lb

## 2014-11-29 DIAGNOSIS — F4323 Adjustment disorder with mixed anxiety and depressed mood: Secondary | ICD-10-CM

## 2014-11-29 DIAGNOSIS — M069 Rheumatoid arthritis, unspecified: Secondary | ICD-10-CM

## 2014-11-29 DIAGNOSIS — I10 Essential (primary) hypertension: Secondary | ICD-10-CM | POA: Diagnosis not present

## 2014-11-29 DIAGNOSIS — Z720 Tobacco use: Secondary | ICD-10-CM

## 2014-11-29 DIAGNOSIS — F17200 Nicotine dependence, unspecified, uncomplicated: Secondary | ICD-10-CM

## 2014-11-29 DIAGNOSIS — Z1231 Encounter for screening mammogram for malignant neoplasm of breast: Secondary | ICD-10-CM

## 2014-11-29 DIAGNOSIS — B351 Tinea unguium: Secondary | ICD-10-CM

## 2014-11-29 DIAGNOSIS — Z23 Encounter for immunization: Secondary | ICD-10-CM

## 2014-11-29 DIAGNOSIS — M79676 Pain in unspecified toe(s): Secondary | ICD-10-CM

## 2014-11-29 LAB — COMPLETE METABOLIC PANEL WITH GFR
ALBUMIN: 4.4 g/dL (ref 3.5–5.2)
ALK PHOS: 132 U/L — AB (ref 39–117)
ALT: 16 U/L (ref 0–35)
AST: 24 U/L (ref 0–37)
BILIRUBIN TOTAL: 0.4 mg/dL (ref 0.2–1.2)
BUN: 9 mg/dL (ref 6–23)
CALCIUM: 9.9 mg/dL (ref 8.4–10.5)
CHLORIDE: 105 meq/L (ref 96–112)
CO2: 24 meq/L (ref 19–32)
Creat: 0.49 mg/dL — ABNORMAL LOW (ref 0.50–1.10)
GFR, Est African American: >89 mL/min
GFR, Est Non African American: >89 mL/min
Glucose, Bld: 86 mg/dL (ref 70–99)
POTASSIUM: 3.9 meq/L (ref 3.5–5.3)
SODIUM: 140 meq/L (ref 135–145)
TOTAL PROTEIN: 7.5 g/dL (ref 6.0–8.3)

## 2014-11-29 LAB — CBC
HCT: 38.1 % (ref 36.0–46.0)
HEMOGLOBIN: 12.5 g/dL (ref 12.0–15.0)
MCH: 29.9 pg (ref 26.0–34.0)
MCHC: 32.8 g/dL (ref 30.0–36.0)
MCV: 91.1 fL (ref 78.0–100.0)
MPV: 9.7 fL (ref 8.6–12.4)
PLATELETS: 292 10*3/uL (ref 150–400)
RBC: 4.18 MIL/uL (ref 3.87–5.11)
RDW: 15.2 % (ref 11.5–15.5)
WBC: 6.1 10*3/uL (ref 4.0–10.5)

## 2014-11-29 LAB — TSH: TSH: 1.023 u[IU]/mL (ref 0.350–4.500)

## 2014-11-29 MED ORDER — AMLODIPINE BESYLATE 5 MG PO TABS: 5.0000 mg | ORAL_TABLET | Freq: Every day | ORAL | Status: DC

## 2014-11-29 MED ORDER — PAROXETINE HCL 10 MG PO TABS: 10.0000 mg | ORAL_TABLET | Freq: Every day | ORAL | Status: DC

## 2014-11-29 NOTE — Patient Instructions (Addendum)
F/u in 6 weeks, call if you need me before  Flu vaccine today  You are referred to podiatry for foot care and clipping toenails  You are referred to Dr Charlestine Night for re evaluation of hands due to progressive weakness, deformity and pain   You need a mammogram  Fasting labs needed as soon a possible

## 2014-11-29 NOTE — Assessment & Plan Note (Addendum)
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

## 2014-11-29 NOTE — Assessment & Plan Note (Addendum)
Uncontrolled, start med DASH diet and commitment to daily physical activity for a minimum of 30 minutes discussed and encouraged, as a part of hypertension management. The importance of attaining a healthy weight is also discussed.  BP/Weight 11/29/2014 02/28/2013 03/29/2012 02/12/2012 11/27/2011 07/07/2011 18/84/1660  Systolic BP 630 160 109 323 557 322 025  Diastolic BP 427 82 80 82 90 84 90  Wt. (Lbs) 126 123.12 133 143.04 147 144 139  BMI 20.35 20.17 21.79 23.43 24.08 23.59 22.77    CMP Latest Ref Rng 11/29/2014 11/27/2011 08/01/2010  Glucose 70 - 99 mg/dL 86 72 87  BUN 6 - 23 mg/dL 9 10 12   Creatinine 0.50 - 1.10 mg/dL 0.49(L) 0.65 0.58  Sodium 135 - 145 mEq/L 140 140 139  Potassium 3.5 - 5.3 mEq/L 3.9 4.0 3.6  Chloride 96 - 112 mEq/L 105 103 106  CO2 19 - 32 mEq/L 24 24 23   Calcium 8.4 - 10.5 mg/dL 9.9 9.8 9.6  Total Protein 6.0 - 8.3 g/dL 7.5 - -  Total Bilirubin 0.2 - 1.2 mg/dL 0.4 - -  Alkaline Phos 39 - 117 U/L 132(H) - -  AST 0 - 37 U/L 24 - -  ALT 0 - 35 U/L 16 - -

## 2014-11-29 NOTE — Progress Notes (Signed)
Subjective:    Patient ID: [name], female    DOB: [birth year], 54 y.o.   MRN: [id number]  HPI The PT is here to re establish care  and re-evaluation of chronic medical conditions, medication management, she states that she is aware she needs medication both for her depression and blood pressure. She also needs the services of a podiatrist and wants re evaluation of her hands. She does not want to see psychiatry for mental health management, she denies suicidal or homicidal thoughts, does report auditory hallucinations, which she states are not harmful Preventive health is updated, specifically  Cancer screening and Immunization. She is accepting the flu vaccine today      Review of Systems See HPI Denies recent fever or chills. Denies sinus pressure, nasal congestion, ear pain or sore throat. Denies chest congestion, productive cough or wheezing. Denies chest pains, palpitations and leg swelling Denies abdominal pain, nausea, vomiting,diarrhea or constipation.   Denies dysuria, frequency, hesitancy or incontinence. C/o increased joint deformity, stiffness, swelling and weakness of both hands, has seen rheumatologis in the past and would like re evaluation Denies headaches, seizures, numbness, or tingling. C/o thick skin on feet with excessively long nails which need to be trimmed, this is a chronic problem also      Objective:   Physical Exam BP 180/100 mmHg  Pulse 99  Resp 16  Ht 5\' 6"  (1.676 m)  Wt 126 lb (57.153 kg)  BMI 20.35 kg/m2  SpO2 100% Patient alert and oriented and in no cardiopulmonary distress.  HEENT: No facial asymmetry, EOMI,   oropharynx pink and moist.  Neck supple no JVD, no mass.  Chest: Bilateral crackles, few wheezes, adequate air entry  CVS: S1, S2 no murmurs, no S3.Regular rate.  ABD: Soft non tender.   Ext: No edema  MS: Adequate ROM spine, shoulders, hips and knees.bilateral deformity of all digits of both hands, swan neck deformity  present , skin is atrophic, and grips of both hands are weak  Skin: severe thickening of skin of both soles , also thickening of toenails which are excessively long  Psych: Good eye contact, normal affect. Memory intact not anxious or depressed appearing.  CNS: CN 2-12 intact, power,  normal throughout.no focal deficits noted.Grade 3 power in both hands        Assessment & Plan:  Rheumatoid arthritis Significant deterioration, bilateral hand pain, with weakness, joint deformity, with swan neck deformity present in some digits , and muscle atrophy, refer to Dr Charlestine Night for re evaluation, last seen in 2013   Essential hypertension Uncontrolled, start med DASH diet and commitment to daily physical activity for a minimum of 30 minutes discussed and encouraged, as a part of hypertension management. The importance of attaining a healthy weight is also discussed.  BP/Weight 11/29/2014 02/28/2013 03/29/2012 02/12/2012 11/27/2011 07/07/2011 35/36/1443  Systolic BP 154 008 676 195 093 267 124  Diastolic BP 580 82 80 82 90 84 90  Wt. (Lbs) 126 123.12 133 143.04 147 144 139  BMI 20.35 20.17 21.79 23.43 24.08 23.59 22.77    CMP Latest Ref Rng 11/29/2014 11/27/2011 08/01/2010  Glucose 70 - 99 mg/dL 86 72 87  BUN 6 - 23 mg/dL 9 10 12   Creatinine 0.50 - 1.10 mg/dL 0.49(L) 0.65 0.58  Sodium 135 - 145 mEq/L 140 140 139  Potassium 3.5 - 5.3 mEq/L 3.9 4.0 3.6  Chloride 96 - 112 mEq/L 105 103 106  CO2 19 - 32 mEq/L 24 24 23  Calcium 8.4 - 10.5 mg/dL 9.9 9.8 9.6  Total Protein 6.0 - 8.3 g/dL 7.5 - -  Total Bilirubin 0.2 - 1.2 mg/dL 0.4 - -  Alkaline Phos 39 - 117 U/L 132(H) - -  AST 0 - 37 U/L 24 - -  ALT 0 - 35 U/L 16 - -        ADJ DISORDER WITH MIXED ANXIETY & DEPRESSED MOOD Deterioration, currently reports depression, not suicidal or homicidal, start medication for same Encouraged pt to see psychiatrist and therapist, she has refused   Pain due to onychomycosis of toenail Excessively  lpong thickened toenails, with thickening of the skin on soles of feet, refer to podiatry for foot and toenail care   NICOTINE ADDICTION Currently smokes 0.5 PPD, sates she wants to quit and is willing to cut back working towards this Patient counseled for approximately 5 minutes regarding the health risks of ongoing nicotine use, specifically all types of cancer, heart disease, stroke and respiratory failure. The options available for help with cessation ,the behavioral changes to assist the process, and the option to either gradully reduce usage  Or abruptly stop.is also discussed. Pt is also encouraged to set specific goals in number of cigarettes used daily, as well as to set a quit date.  Number of cigarettes/cigars currently smoking daily: 10    Need for prophylactic vaccination and inoculation against influenza After obtaining informed consent, the vaccine is  administered by LPN.

## 2014-12-01 ENCOUNTER — Ambulatory Visit (HOSPITAL_COMMUNITY)
Admission: RE | Admit: 2014-12-01 | Discharge: 2014-12-01 | Disposition: A | Discharge status: Home / Self Care | Payer: Medicaid Other | Source: Ambulatory Visit | Attending: Family Medicine | Admitting: Family Medicine

## 2014-12-01 DIAGNOSIS — Z1231 Encounter for screening mammogram for malignant neoplasm of breast: Secondary | ICD-10-CM

## 2014-12-05 ENCOUNTER — Encounter (HOSPITAL_COMMUNITY): Payer: Medicaid Other

## 2014-12-05 ENCOUNTER — Other Ambulatory Visit: Payer: Self-pay | Admitting: Family Medicine

## 2014-12-05 ENCOUNTER — Ambulatory Visit (HOSPITAL_COMMUNITY)
Admission: RE | Admit: 2014-12-05 | Discharge: 2014-12-05 | Disposition: A | Discharge status: Home / Self Care | Payer: Medicaid Other | Source: Ambulatory Visit | Attending: Family Medicine | Admitting: Family Medicine

## 2014-12-05 DIAGNOSIS — Z1231 Encounter for screening mammogram for malignant neoplasm of breast: Secondary | ICD-10-CM

## 2014-12-07 ENCOUNTER — Encounter (HOSPITAL_COMMUNITY): Payer: Medicaid Other

## 2014-12-10 ENCOUNTER — Encounter: Payer: Self-pay | Admitting: Family Medicine

## 2014-12-10 DIAGNOSIS — Z23 Encounter for immunization: Secondary | ICD-10-CM | POA: Insufficient documentation

## 2014-12-10 DIAGNOSIS — B351 Tinea unguium: Secondary | ICD-10-CM | POA: Insufficient documentation

## 2014-12-10 DIAGNOSIS — M79676 Pain in unspecified toe(s): Secondary | ICD-10-CM | POA: Insufficient documentation

## 2014-12-10 NOTE — Assessment & Plan Note (Signed)
Excessively lpong thickened toenails, with thickening of the skin on soles of feet, refer to podiatry for foot and toenail care

## 2014-12-10 NOTE — Assessment & Plan Note (Signed)
Deterioration, currently reports depression, not suicidal or homicidal, start medication for same Encouraged pt to see psychiatrist and therapist, she has refused

## 2014-12-10 NOTE — Assessment & Plan Note (Signed)
After obtaining informed consent, the vaccine is  administered by LPN.  

## 2014-12-10 NOTE — Assessment & Plan Note (Addendum)
Currently smokes 0.5 PPD, sates she wants to quit and is willing to cut back working towards this Patient counseled for approximately 5 minutes regarding the health risks of ongoing nicotine use, specifically all types of cancer, heart disease, stroke and respiratory failure. The options available for help with cessation ,the behavioral changes to assist the process, and the option to either gradully reduce usage  Or abruptly stop.is also discussed. Pt is also encouraged to set specific goals in number of cigarettes used daily, as well as to set a quit date.  Number of cigarettes/cigars currently smoking daily: 10

## 2014-12-12 ENCOUNTER — Telehealth: Payer: Self-pay | Admitting: Family Medicine

## 2014-12-12 NOTE — Telephone Encounter (Signed)
Pls contact [name], her caseworker, explain that Dr Charlestine Night will not see her , I will discuss further at next visit (she would not take the med he had prescribed in the past)

## 2014-12-13 NOTE — Telephone Encounter (Signed)
Spoke with [name] and she is aware of this

## 2015-01-09 ENCOUNTER — Ambulatory Visit (HOSPITAL_COMMUNITY)
Admission: RE | Admit: 2015-01-09 | Discharge: 2015-01-09 | Disposition: A | Discharge status: Home / Self Care | Payer: Medicaid Other | Source: Ambulatory Visit | Attending: Family Medicine | Admitting: Family Medicine

## 2015-01-09 ENCOUNTER — Ambulatory Visit (INDEPENDENT_AMBULATORY_CARE_PROVIDER_SITE_OTHER): Payer: Medicaid Other | Admitting: Family Medicine

## 2015-01-09 ENCOUNTER — Encounter: Payer: Self-pay | Admitting: Family Medicine

## 2015-01-09 VITALS — BP 150/90 | HR 76 | Resp 18 | Ht 66.0 in | Wt 121.0 lb

## 2015-01-09 DIAGNOSIS — F17200 Nicotine dependence, unspecified, uncomplicated: Secondary | ICD-10-CM

## 2015-01-09 DIAGNOSIS — R05 Cough: Secondary | ICD-10-CM

## 2015-01-09 DIAGNOSIS — M79674 Pain in right toe(s): Secondary | ICD-10-CM

## 2015-01-09 DIAGNOSIS — I10 Essential (primary) hypertension: Secondary | ICD-10-CM

## 2015-01-09 DIAGNOSIS — Z720 Tobacco use: Secondary | ICD-10-CM

## 2015-01-09 DIAGNOSIS — M79641 Pain in right hand: Secondary | ICD-10-CM

## 2015-01-09 DIAGNOSIS — M79675 Pain in left toe(s): Secondary | ICD-10-CM

## 2015-01-09 DIAGNOSIS — M79642 Pain in left hand: Secondary | ICD-10-CM

## 2015-01-09 DIAGNOSIS — Z1211 Encounter for screening for malignant neoplasm of colon: Secondary | ICD-10-CM | POA: Diagnosis not present

## 2015-01-09 DIAGNOSIS — F4323 Adjustment disorder with mixed anxiety and depressed mood: Secondary | ICD-10-CM

## 2015-01-09 DIAGNOSIS — R059 Cough, unspecified: Secondary | ICD-10-CM

## 2015-01-09 DIAGNOSIS — R634 Abnormal weight loss: Secondary | ICD-10-CM

## 2015-01-09 MED ORDER — MEGESTROL ACETATE 40 MG PO TABS: 40.0000 mg | ORAL_TABLET | Freq: Every day | ORAL | Status: DC

## 2015-01-09 MED ORDER — AMLODIPINE BESYLATE 10 MG PO TABS: 10.0000 mg | ORAL_TABLET | Freq: Every day | ORAL | Status: DC

## 2015-01-09 NOTE — Progress Notes (Signed)
Subjective:    Patient ID: [name], female    DOB: 04-Mar-1961, 54 y.o.   MRN: [id number]  HPI The PT is here for follow up and re-evaluation of chronic medical conditions, medication management and review of any available recent lab and radiology data.  Preventive health is updated, specifically  Cancer screening and Immunization.   Questions or concerns regarding consultations or procedures which the PT has had in the interim are  Addressed.Still needs rheumatology eval, and will ned to seek a new Provider , also needs her colonoscopy The PT denies any adverse reactions to current medications since the last visit. Chronic cough and underweight status are cause  For concern, CXR today and start a trial of megace, low dose , short term     Review of Systems See HPI Denies recent fever or chills. Denies sinus pressure, nasal congestion, ear pain or sore throat.  Denies chest pains, palpitations and leg swelling Denies abdominal pain, nausea, vomiting,diarrhea or constipation.   Denies dysuria, frequency, hesitancy or incontinence. Denies headaches, seizures, numbness, or tingling. Denies uncontrolled  depression, anxiety or insomnia. Chronic severe thickened skin on feet and severe onychomycosis       Objective:   Physical Exam BP 150/90 mmHg  Pulse 76  Resp 18  Ht 5\' 6"  (1.676 m)  Wt 121 lb (54.885 kg)  BMI 19.54 kg/m2  SpO2 100%  LMP 05/20/2011 Patient alert and oriented and in no cardiopulmonary distress.  HEENT: No facial asymmetry, EOMI,   oropharynx pink and moist.  Neck supple no JVD, no mass.  Chest: Clear to auscultation bilaterally.Decreased though adequate air entry  CVS: S1, S2 no murmurs, no S3.Regular rate.  ABD: Soft non tender.   Ext: No edema  MS: Adequate ROM spine, shoulders, hips and knees.Marked deformity of all digits and reduced grip in hands    Psych: Good eye contact, blunted affect. Memory mildly impaired not anxious or depressed  appearing.  CNS: CN 2-12 intact       Assessment & Plan:  Essential hypertension Still uncontrolled DASH diet and commitment to daily physical activity for a minimum of 30 minutes discussed and encouraged, as a part of hypertension management. The importance of attaining a healthy weight is also discussed.  BP/Weight 01/09/2015 11/29/2014 02/28/2013 03/29/2012 02/12/2012 11/27/2011 43/15/4008  Systolic BP 676 195 093 267 124 580 998  Diastolic BP 84 338 82 80 82 90 84  Wt. (Lbs) 121 126 123.12 133 143.04 147 144  BMI 19.54 20.35 20.17 21.79 23.43 24.08 23.59   Increase dose of amlodipine    WEIGHT LOSS Megace started and pt encouraged to eat regularly, and increase food intake  NICOTINE ADDICTION Patient counseled for approximately 5 minutes regarding the health risks of ongoing nicotine use, specifically all types of cancer, heart disease, stroke and respiratory failure. The options available for help with cessation ,the behavioral changes to assist the process, and the option to either gradully reduce usage  Or abruptly stop.is also discussed. Pt is also encouraged to set specific goals in number of cigarettes used daily, as well as to set a quit date.  Number of cigarettes/cigars currently smoking daily: 20   Bilateral hand pain Bilateral hands pain and stiffness with reduced mobility, needs to see another  Rheumatologist for help, will refer for evaluation , Dr Charlestine Night refused to see her , she has seen him in the past and was deemed not to have rheumatoid arthritis  Toe pain, bilateral Will refer for  rheumatology eval  ADJ DISORDER WITH MIXED ANXIETY & DEPRESSED MOOD Improved, not suicidal or  Homicidal, not hallucinating, continue current meds

## 2015-01-09 NOTE — Patient Instructions (Addendum)
Physical exam  In 2 month, call if you need me before  CXR today please.  Blood pressure better, but still too high., INCREASE to amlodipine 10 mg daily.  Take TWO 5mg  tablets every morning , start tomorrow , till done  New pill is 10mg  ONE daily  You are referred to Dr Laural Golden for colonoscopy  I will try to find another rheumatologist to look at your hands and toes (small joints)  New is megace  40 mg once daily for 2 months to help appetite  Work on smoking cessation  Thanks for choosing [facility], we consider it a privelige to serve you.

## 2015-01-09 NOTE — Assessment & Plan Note (Signed)
Still uncontrolled DASH diet and commitment to daily physical activity for a minimum of 30 minutes discussed and encouraged, as a part of hypertension management. The importance of attaining a healthy weight is also discussed.  BP/Weight 01/09/2015 11/29/2014 02/28/2013 03/29/2012 02/12/2012 11/27/2011 80/88/1103  Systolic BP 159 458 592 924 462 863 817  Diastolic BP 84 711 82 80 82 90 84  Wt. (Lbs) 121 126 123.12 133 143.04 147 144  BMI 19.54 20.35 20.17 21.79 23.43 24.08 23.59   Increase dose of amlodipine

## 2015-01-10 ENCOUNTER — Encounter (INDEPENDENT_AMBULATORY_CARE_PROVIDER_SITE_OTHER): Payer: Self-pay | Admitting: *Deleted

## 2015-02-15 ENCOUNTER — Other Ambulatory Visit: Payer: Self-pay

## 2015-02-15 DIAGNOSIS — I10 Essential (primary) hypertension: Secondary | ICD-10-CM

## 2015-02-15 DIAGNOSIS — F4323 Adjustment disorder with mixed anxiety and depressed mood: Secondary | ICD-10-CM

## 2015-02-15 DIAGNOSIS — R634 Abnormal weight loss: Secondary | ICD-10-CM

## 2015-02-15 MED ORDER — MEGESTROL ACETATE 40 MG PO TABS: 40.0000 mg | ORAL_TABLET | Freq: Every day | ORAL | Status: DC

## 2015-02-15 MED ORDER — PAROXETINE HCL 10 MG PO TABS: 10.0000 mg | ORAL_TABLET | Freq: Every day | ORAL | Status: DC

## 2015-02-15 MED ORDER — AMLODIPINE BESYLATE 10 MG PO TABS: 10.0000 mg | ORAL_TABLET | Freq: Every day | ORAL | Status: DC

## 2015-02-26 ENCOUNTER — Telehealth: Payer: Self-pay | Admitting: Family Medicine

## 2015-02-26 DIAGNOSIS — M79674 Pain in right toe(s): Secondary | ICD-10-CM | POA: Insufficient documentation

## 2015-02-26 DIAGNOSIS — M79642 Pain in left hand: Secondary | ICD-10-CM

## 2015-02-26 DIAGNOSIS — M79675 Pain in left toe(s): Secondary | ICD-10-CM

## 2015-02-26 DIAGNOSIS — M79641 Pain in right hand: Secondary | ICD-10-CM | POA: Insufficient documentation

## 2015-02-26 NOTE — Assessment & Plan Note (Signed)
Bilateral hands pain and stiffness with reduced mobility, needs to see another  Rheumatologist for help, will refer for evaluation , Dr Charlestine Night refused to see her , she has seen him in the past and was deemed not to have rheumatoid arthritis

## 2015-02-26 NOTE — Telephone Encounter (Signed)
Please see if you can find a rheumatologist in [place] who will, see her for bilateral hand and foot pain with severe deformity  Of all digits. We already know tat Dr Charlestine Night will not see her , he had seen her years ago, so need to look at alternatives, new referral are entered  ?/ pls ask

## 2015-02-26 NOTE — Assessment & Plan Note (Signed)
Megace started and pt encouraged to eat regularly, and increase food intake

## 2015-02-26 NOTE — Assessment & Plan Note (Signed)

## 2015-02-26 NOTE — Assessment & Plan Note (Signed)
Improved, not suicidal or  Homicidal, not hallucinating, continue current meds

## 2015-02-26 NOTE — Assessment & Plan Note (Signed)
Will refer for rheumatology eval

## 2015-02-27 ENCOUNTER — Other Ambulatory Visit: Payer: Self-pay | Admitting: Family Medicine

## 2015-03-01 ENCOUNTER — Encounter: Payer: Self-pay | Admitting: Family Medicine

## 2015-03-02 ENCOUNTER — Telehealth: Payer: Self-pay

## 2015-03-02 MED ORDER — PAROXETINE HCL 20 MG PO TABS: 20.0000 mg | ORAL_TABLET | Freq: Every day | ORAL | Status: DC

## 2015-03-02 NOTE — Telephone Encounter (Signed)
Called and left voicemail for case worker notifying of increase in dose.  Dose changed faxed to pharmacy.

## 2015-03-02 NOTE — Telephone Encounter (Signed)
Dose is increased pl fax and let them know

## 2015-03-06 NOTE — Telephone Encounter (Signed)
Faxed over papers to Dr Dossie Der in [place]

## 2015-03-15 ENCOUNTER — Ambulatory Visit (INDEPENDENT_AMBULATORY_CARE_PROVIDER_SITE_OTHER): Payer: Medicaid Other | Admitting: Family Medicine

## 2015-03-15 ENCOUNTER — Encounter: Payer: Self-pay | Admitting: Family Medicine

## 2015-03-15 VITALS — BP 140/82 | HR 72 | Resp 16 | Ht 66.0 in

## 2015-03-15 DIAGNOSIS — M79674 Pain in right toe(s): Secondary | ICD-10-CM

## 2015-03-15 DIAGNOSIS — M069 Rheumatoid arthritis, unspecified: Secondary | ICD-10-CM

## 2015-03-15 DIAGNOSIS — F4323 Adjustment disorder with mixed anxiety and depressed mood: Secondary | ICD-10-CM | POA: Diagnosis not present

## 2015-03-15 DIAGNOSIS — M79675 Pain in left toe(s): Secondary | ICD-10-CM

## 2015-03-15 DIAGNOSIS — I10 Essential (primary) hypertension: Secondary | ICD-10-CM

## 2015-03-15 DIAGNOSIS — R634 Abnormal weight loss: Secondary | ICD-10-CM

## 2015-03-15 DIAGNOSIS — Z720 Tobacco use: Secondary | ICD-10-CM | POA: Diagnosis not present

## 2015-03-15 DIAGNOSIS — F17200 Nicotine dependence, unspecified, uncomplicated: Secondary | ICD-10-CM

## 2015-03-15 NOTE — Patient Instructions (Addendum)
Follow up in 4 months with rectal   No change in medication , blood pressure is much improved  Continue to reduce cigarettes , you are doing well, now down to 5 per da, so I know that you will continue to do  Your best to quit , as we discussed  Your social worker will look into getting you enrolled in "quit smoking classes " as we discussed, also it s wonderful that your grand children's visit made you so happy, and that you want to get out more and become  Involved with the opportunity center / similar group in the community, she will also help organize this  Thanks for choosing [facility], we consider it a privelige to serve you.

## 2015-03-16 NOTE — Progress Notes (Signed)
   [name]     MRN: [id number]      DOB: [birth year]   HPI [name] is here for follow up and re-evaluation of chronic medical conditions, medication management and review of any available recent lab and radiology data.  Preventive health is updated, specifically  Cancer screening and Immunization.   Questions or concerns regarding consultations or procedures which the PT has had in the interim are  addressed. The PT denies any adverse reactions to current medications since the last visit.  There are no new concerns.  There are no specific complaints   ROS Denies recent fever or chills. Denies sinus pressure, nasal congestion, ear pain or sore throat. Denies chest congestion, productive cough or wheezing. Denies chest pains, palpitations and leg swelling Denies abdominal pain, nausea, vomiting,diarrhea or constipation.   Denies dysuria, frequency, hesitancy or incontinence. Denies joint pain, swelling and limitation in mobility. Denies headaches, seizures, numbness, or tingling. Denies depression, anxiety or insomnia. Denies skin break down or rash.   PE  BP 140/82 mmHg  Pulse 72  Resp 16  Ht 5\' 6"  (1.676 m)  SpO2 100%  LMP 05/20/2011  Patient alert and oriented and in no cardiopulmonary distress.  HEENT: No facial asymmetry, EOMI,   oropharynx pink and moist.  Neck supple no JVD, no mass.  Chest: Clear to auscultation bilaterally.  CVS: S1, S2 no murmurs, no S3.Regular rate.  ABD: Soft non tender.   Ext: No edema  MS: Adequate ROM spine, shoulders, hips and knees.  Skin: Intact, no ulcerations or rash noted.  Psych: Good eye contact, normal affect. Memory intact not anxious or depressed appearing.  CNS: CN 2-12 intact, power,  normal throughout.no focal deficits noted.   Assessment & Plan   Essential hypertension Improved, systolic still above normal, however, hopefully with increased exercise and nicotine cessation this will correct, no med  change  NICOTINE ADDICTION Marked im[provement and wants to quit Patient counseled for approximately 5 minutes regarding the health risks of ongoing nicotine use, specifically all types of cancer, heart disease, stroke and respiratory failure. The options available for help with cessation ,the behavioral changes to assist the process, and the option to either gradully reduce usage  Or abruptly stop.is also discussed. Pt is also encouraged to set specific goals in number of cigarettes used daily, as well as to set a quit date.  Number of cigarettes/cigars currently smoking daily:5   ADJ DISORDER WITH MIXED ANXIETY & DEPRESSED MOOD Improved on higher dose of paxil, now wants to become in group programs which I highly encourage her s/w to follow through on  WEIGHT LOSS Need st to return for weight when power available and the following month as may need dose increase of her megace, she does report improved appetite currently  Toe pain, bilateral Improved  as now getting treatment by podiatry  Rheumatoid arthritis Has upcpmng appt with rheu

## 2015-03-16 NOTE — Assessment & Plan Note (Signed)
Improved, systolic still above normal, however, hopefully with increased exercise and nicotine cessation this will correct, no med change

## 2015-03-16 NOTE — Assessment & Plan Note (Signed)
Need st to return for weight when power available and the following month as may need dose increase of her megace, she does report improved appetite currently

## 2015-03-16 NOTE — Assessment & Plan Note (Signed)
Marked im[provement and wants to quit Patient counseled for approximately 5 minutes regarding the health risks of ongoing nicotine use, specifically all types of cancer, heart disease, stroke and respiratory failure. The options available for help with cessation ,the behavioral changes to assist the process, and the option to either gradully reduce usage  Or abruptly stop.is also discussed. Pt is also encouraged to set specific goals in number of cigarettes used daily, as well as to set a quit date.  Number of cigarettes/cigars currently smoking daily:5

## 2015-03-16 NOTE — Assessment & Plan Note (Signed)
Improved on higher dose of paxil, now wants to become in group programs which I highly encourage her s/w to follow through on

## 2015-03-16 NOTE — Assessment & Plan Note (Signed)
Has upcpmng appt with rheu

## 2015-03-16 NOTE — Assessment & Plan Note (Signed)
Improved  as now getting treatment by podiatry

## 2015-04-18 ENCOUNTER — Telehealth: Payer: Self-pay | Admitting: Family Medicine

## 2015-04-18 ENCOUNTER — Encounter: Payer: Self-pay | Admitting: Family Medicine

## 2015-04-18 DIAGNOSIS — M1990 Unspecified osteoarthritis, unspecified site: Secondary | ICD-10-CM | POA: Insufficient documentation

## 2015-04-18 DIAGNOSIS — R768 Other specified abnormal immunological findings in serum: Secondary | ICD-10-CM

## 2015-04-18 NOTE — Telephone Encounter (Signed)
Please contact pt and her social worker, explain that I am referring her top anew rheumatologist at [facility] as the office in [place] is closed as of 05/18/2015 , I am entering the referral , pls schedule appointment and let patient and her social worker know please ??/pls ask

## 2015-04-19 NOTE — Telephone Encounter (Signed)
Referral has been Faxed, I tried to call [name] pt's social worker, [facility].

## 2015-05-17 ENCOUNTER — Other Ambulatory Visit: Payer: Self-pay | Admitting: Family Medicine

## 2015-05-29 ENCOUNTER — Other Ambulatory Visit: Payer: Self-pay | Admitting: Family Medicine

## 2015-06-18 ENCOUNTER — Other Ambulatory Visit: Payer: Self-pay | Admitting: Family Medicine

## 2015-07-05 ENCOUNTER — Encounter: Payer: Self-pay | Admitting: Family Medicine

## 2015-07-05 ENCOUNTER — Ambulatory Visit (INDEPENDENT_AMBULATORY_CARE_PROVIDER_SITE_OTHER): Payer: Medicaid Other | Admitting: Family Medicine

## 2015-07-05 VITALS — BP 130/84 | HR 88 | Resp 16 | Ht 66.0 in | Wt 135.0 lb

## 2015-07-05 DIAGNOSIS — Z1159 Encounter for screening for other viral diseases: Secondary | ICD-10-CM | POA: Diagnosis not present

## 2015-07-05 DIAGNOSIS — Z23 Encounter for immunization: Secondary | ICD-10-CM

## 2015-07-05 DIAGNOSIS — F17200 Nicotine dependence, unspecified, uncomplicated: Secondary | ICD-10-CM | POA: Diagnosis not present

## 2015-07-05 DIAGNOSIS — B351 Tinea unguium: Secondary | ICD-10-CM

## 2015-07-05 DIAGNOSIS — I10 Essential (primary) hypertension: Secondary | ICD-10-CM | POA: Diagnosis not present

## 2015-07-05 DIAGNOSIS — Z1211 Encounter for screening for malignant neoplasm of colon: Secondary | ICD-10-CM

## 2015-07-05 DIAGNOSIS — F418 Other specified anxiety disorders: Secondary | ICD-10-CM

## 2015-07-05 DIAGNOSIS — M1990 Unspecified osteoarthritis, unspecified site: Secondary | ICD-10-CM

## 2015-07-05 DIAGNOSIS — M79676 Pain in unspecified toe(s): Secondary | ICD-10-CM

## 2015-07-05 LAB — POC HEMOCCULT BLD/STL (OFFICE/1-CARD/DIAGNOSTIC): Fecal Occult Blood, POC: NEGATIVE

## 2015-07-05 NOTE — Progress Notes (Signed)
Subjective:    Patient ID: [name], female    DOB: [birth year], 54 y.o.   MRN: [id number] pla HPI The PT is here for follow up and re-evaluation of chronic medical conditions, medication management and review of any available recent lab and radiology data.  She is here with her Education officer, museum with great reports. She has seen podiatrist, who has her in a walking boot on left foot and is operating to straighten her toes, she is pleased with this. Has been to smoking cessation classes, and ha quit date set for 10/25, down to 7 ciggs per day. Has seen rheumatologist an needs to f/u with new Doc , as she has lab values positive for autoimmune disease. Preventive health is updated, specifically  Cancer screening and Immunization.    The PT denies any adverse reactions to current medications since the last visit.  There are no new concerns.  There are no specific complaints       Review of Systems See HPI Denies recent fever or chills. Denies sinus pressure, nasal congestion, ear pain or sore throat. Denies chest congestion, productive cough or wheezing. Denies chest pains, palpitations and leg swelling Denies abdominal pain, nausea, vomiting,diarrhea or constipation.   Denies dysuria, frequency, hesitancy or incontinence. C/o chronic  joint pain, swelling and limitation in mobility. Denies headaches, seizures, numbness, or tingling. Denies uncontrolled  depression, anxiety or insomnia. Denies skin break down or rash.        Objective:   Physical Exam BP 130/84 mmHg  Pulse 88  Resp 16  Ht 5\' 6"  (1.676 m)  Wt 135 lb (61.236 kg)  BMI 21.80 kg/m2  SpO2 99%  LMP 05/20/2011 Patient alert and oriented and in no cardiopulmonary distress.  HEENT: No facial asymmetry, EOMI,   oropharynx pink and moist.  Neck supple no JVD, no mass.  Chest: Clear to auscultation bilaterally.decreased though adequate air entry  CVS: S1, S2 no murmurs, no S3.Regular rate.  ABD: Soft non  tender.No organomegaly or mass. Rectal: no mass, heme negative stool   Ext: No edema  MS: Adequate ROM spine, shoulders, hips and knees.  Skin: Intact, no ulcerations or rash noted.  Psych: Good eye contact, normal affect. Memory intact not anxious or depressed appearing.  CNS: CN 2-12 intact, power,  normal throughout.no focal deficits noted.        Assessment & Plan:  Essential hypertension Controlled, no change in medication DASH diet and commitment to daily physical activity for a minimum of 30 minutes discussed and encouraged, as a part of hypertension management. The importance of attaining a healthy weight is also discussed.  BP/Weight 07/05/2015 03/15/2015 01/09/2015 11/29/2014 02/28/2013 03/29/2012 7/61/9509  Systolic BP 326 712 458 099 833 825 053  Diastolic BP 84 82 90 976 82 80 82  Wt. (Lbs) 135 - 121 126 123.12 133 143.04  BMI 21.8 - 19.54 20.35 20.17 21.79 23.43        Inflammatory arthritis Needs to be referred to rheumatologist for furter evaluation and management, theDoc she recently started with and who diagnosed her , has relocated , she will be referred to Doc [facility]  NICOTINE ADDICTION Ready to quit and is proud to reports having attended smoking cessation classes, smokes on avg 5 per day Patient counseled for approximately 5 minutes regarding the health risks of ongoing nicotine use, specifically all types of cancer, heart disease, stroke and respiratory failure. The options available for help with cessation ,the behavioral changes to assist  the process, and the option to either gradully reduce usage  Or abruptly stop.is also discussed. Pt is also encouraged to set specific goals in number of cigarettes used daily, as well as to set a quit date.  Number of cigarettes/cigars currently smoking daily:5 to7  Quit date set, has gum to chew   Pain due to onychomycosis of toenail Improved, nbeing treated by podiatry with surgical intervention  on initially left foot , then will proceed to right foot  Need for prophylactic vaccination and inoculation against influenza After obtaining informed consent, the vaccine is  administered by LPN.   Depression with anxiety Controlled, no change in medication Also recently enrolled at [facility]

## 2015-07-05 NOTE — Patient Instructions (Addendum)
F/u in 4 month, call if you need me sooner Thankful that you are improving  Quit date for cigarette is 07/10/2015, use gum instead  You are referred to rheumatologist at [facility] for further evaluation  Rectal exam today is normal  Flu vaccine today  Thanks for choosing [facility], we consider it a privelige to serve you.

## 2015-07-07 DIAGNOSIS — F418 Other specified anxiety disorders: Secondary | ICD-10-CM | POA: Insufficient documentation

## 2015-07-07 HISTORY — DX: Other specified anxiety disorders: F41.8

## 2015-07-07 NOTE — Assessment & Plan Note (Signed)
Improved, nbeing treated by podiatry with surgical intervention on initially left foot , then will proceed to right foot

## 2015-07-07 NOTE — Assessment & Plan Note (Signed)
After obtaining informed consent, the vaccine is  administered by LPN.  

## 2015-07-07 NOTE — Assessment & Plan Note (Signed)
Needs to be referred to rheumatologist for furter evaluation and management, theDoc she recently started with and who diagnosed her , has relocated , she will be referred to Doc thru [facility]

## 2015-07-07 NOTE — Assessment & Plan Note (Signed)
Controlled, no change in medication DASH diet and commitment to daily physical activity for a minimum of 30 minutes discussed and encouraged, as a part of hypertension management. The importance of attaining a healthy weight is also discussed.  BP/Weight 07/05/2015 03/15/2015 01/09/2015 11/29/2014 02/28/2013 03/29/2012 0/26/3785  Systolic BP 885 027 741 287 867 672 094  Diastolic BP 84 82 90 709 82 80 82  Wt. (Lbs) 135 - 121 126 123.12 133 143.04  BMI 21.8 - 19.54 20.35 20.17 21.79 23.43

## 2015-07-07 NOTE — Assessment & Plan Note (Signed)
Controlled, no change in medication Also recently enrolled at [facility]

## 2015-07-07 NOTE — Assessment & Plan Note (Signed)
Ready to quit and is proud to reports having attended smoking cessation classes, smokes on avg 5 per day Patient counseled for approximately 5 minutes regarding the health risks of ongoing nicotine use, specifically all types of cancer, heart disease, stroke and respiratory failure. The options available for help with cessation ,the behavioral changes to assist the process, and the option to either gradully reduce usage  Or abruptly stop.is also discussed. Pt is also encouraged to set specific goals in number of cigarettes used daily, as well as to set a quit date.  Number of cigarettes/cigars currently smoking daily:5 to7  Quit date set, has gum to chew

## 2015-07-10 ENCOUNTER — Ambulatory Visit: Payer: Medicaid Other | Admitting: Family Medicine

## 2015-08-28 ENCOUNTER — Encounter (HOSPITAL_COMMUNITY): Payer: Self-pay | Admitting: Occupational Therapy

## 2015-08-28 ENCOUNTER — Ambulatory Visit (HOSPITAL_COMMUNITY): Payer: Medicaid Other | Attending: Internal Medicine | Admitting: Occupational Therapy

## 2015-08-28 DIAGNOSIS — M79642 Pain in left hand: Secondary | ICD-10-CM | POA: Insufficient documentation

## 2015-08-28 DIAGNOSIS — M19041 Primary osteoarthritis, right hand: Secondary | ICD-10-CM

## 2015-08-28 DIAGNOSIS — M25641 Stiffness of right hand, not elsewhere classified: Secondary | ICD-10-CM | POA: Insufficient documentation

## 2015-08-28 DIAGNOSIS — M1990 Unspecified osteoarthritis, unspecified site: Secondary | ICD-10-CM | POA: Diagnosis present

## 2015-08-28 DIAGNOSIS — M19042 Primary osteoarthritis, left hand: Secondary | ICD-10-CM

## 2015-08-28 DIAGNOSIS — M79641 Pain in right hand: Secondary | ICD-10-CM

## 2015-08-28 DIAGNOSIS — M25642 Stiffness of left hand, not elsewhere classified: Secondary | ICD-10-CM | POA: Diagnosis present

## 2015-08-28 NOTE — Patient Instructions (Signed)
Wrist AROM Exercises   1) Wrist Flexion  Start with wrist at edge of table, palm facing up. With wrist hanging slightly off table, curl wrist upward, and back down.      2) Wrist Extension  Start with wrist at edge of table, palm facing down. With wrist slightly off the edge of the table, curl wrist up and back down.      3) Radial Deviations  Start with forearm flat against a table, wrist hanging slightly off the edge, and palm facing the wall. Bending at the wrist only, and keeping palm facing the wall, bend wrist so fist is pointing towards the floor, back up to start position, and up towards the ceiling. Return to start.        4) WRIST PRONATION  Turn your forearm towards palm face down.  Keep your elbow bent and by the side of your  Body.      5) WRIST SUPINATION  Turn your forearm towards palm face up.  Keep your elbow bent and by the side of your  Body.      *Complete exercises __10____ times each, ____2___ times per day*      AROM: DIP Flexion / Extension   Pinch middle knuckle of ____each____ finger of both hands to prevent bending. Bend end knuckle until stretch is felt. Hold __3__ seconds. Relax. Straighten finger as far as possible. Repeat __10__ times per set.  Do _2___ sessions per day.  Copyright  VHI. All rights reserved.    AROM: PIP Flexion / Extension   Pinch bottom knuckle of ____each____ finger of both hands to prevent bending. Actively bend middle knuckle until stretch is felt. Hold __3__ seconds. Relax. Straighten finger as far as possible. Repeat _10___ times per set.  Do __2__ sessions per day.  Copyright  VHI. All rights reserved.   AROM: Finger Flexion / Extension   Actively bend fingers of both hands. Start with knuckles furthest from palm, and slowly make a fist. Hold _3___ seconds. Relax. Then straighten fingers as far as possible. Repeat __10__ times per set.  Do __2__ sessions per day.  Copyright  VHI.  All rights reserved.   Paper Crumpling Exercise   Begin with right palm down on piece of paper. Maintaining contact between surface and heel of hand, crumple paper into a ball. Repeat ____ times per set. Do ____ sets per session. Do ____ sessions per day.  Copyright  VHI. All rights reserved.     Abduction / Adduction (Active)    With hand flat on table, spread all fingers apart, then bring them together as close as possible. Repeat __10__ times. Do __2__ sessions per day.  Copyright  VHI. All rights reserved.  AROM: Thumb Abduction / Adduction   Actively pull right thumb away from palm as far as possible. Hold __3__ seconds. Then bring thumb back to touch fingers. Try not to bend fingers toward thumb. Repeat _10___ times per set.  Do ___2_ sessions per day.  Copyright  VHI. All rights reserved.      Additional Exercises: 1) Thumb stretch-pull thumb across palm and try to touch bottom of pinky finger with thumb, hold for 3 seconds. Repeat 10 times.   2) Make a fist with each hand, hold for 3 seconds, spread fingers out completely. Repeat 10 times.   3) Contrast Bath:   Cautions:  Avoid use on areas with poor blood flow (such as on hands or feet that  are always cold) or with  decreased sensation (areas that feel numb or  tingling).  Materials Needed:  1. Two large containers (at least 4 to 6 inches deep)  2. Hot and cold water  3. Canning or candy thermometer  Procedure:  1. Prepare the water and fill one container about two-thirds full with hot water (100-110F). Fill the other container about two-thirds full with cold water (59-58F).  2. Test the temperatures with the thermometer.  3. Put both hands or feet completely into the hot water and then the cold water following this schedule:  Hot  3 to 4 minutes  Cold 1 minute  Repeat 5 times 4. The contrast baths can continue to a total of 30  minutes  5. Start and end the contrast baths with hands or feet in    hot water.  6. Towel Dry

## 2015-08-28 NOTE — Therapy (Signed)
[place], [place], [address] Phone: [phone]   Fax:  [phone]  Occupational Therapy Evaluation  Patient Details  Name: [name] MRN: [id number] Date of Birth: Jul 11, 1961 Referring Provider: Max Fickle  Encounter Date: 08/28/2015      OT End of Session - 08/28/15 1126    Visit Number 1   Number of Visits 1   Date for OT Re-Evaluation 10/27/15   Authorization Type Medicaid   OT Start Time 1018   OT Stop Time 1107   OT Time Calculation (min) 49 min   Activity Tolerance Patient tolerated treatment well   Behavior During Therapy [facility] for tasks assessed/performed      Past Medical History  Diagnosis Date  . Chronic abdominal pain     resolved  . Hypertension   . Psychosis   . Nicotine addiction   . Anxiety   . H. pylori infection 2/09    s/p prevpac    Past Surgical History  Procedure Laterality Date  . Surgery of left middle finger on left hand (childhood )    . Tubal ligation    . Cholecystectomy    . Colonoscopy  02/07/2011    SLF: Slightly tortuous colon.  Otherwise normal colon without evidence of polyps, masses, inflammatory changes, diverticular AVMs    There were no vitals filed for this visit.  Visit Diagnosis:  Arthritis of both hands  Bilateral hand pain  Stiffness of hand joint, right  Stiffness of hand joint, left      Subjective Assessment - 08/28/15 1119    Subjective  S: My hands are very tender and painful.    Patient is accompained by: --  Education officer, museum   Pertinent History Pt is a 54 y/o female who presents with bilateral hand pain due to arthritis, beginning approximately 1 year ago. Pt is accompanied today by Education officer, museum, Engineer, civil (consulting). Dr. Max Fickle referred pt to occupational therapy for evaluation and treatment.    Patient Stated Goals to have less pain in my hands   Currently in Pain? Yes   Pain Score 3    Pain Location Hand   Pain  Orientation Right;Left   Pain Descriptors / Indicators Aching   Pain Type Chronic pain           [facility] OT Assessment - 08/28/15 1017    Assessment   Diagnosis bilateral hand pain due to arthritis   Referring Provider Max Fickle   Onset Date --  2015   Prior Therapy None   Precautions   Precautions None   Balance Screen   Has the patient fallen in the past 6 months No   Has the patient had a decrease in activity level because of a fear of falling?  No   Is the patient reluctant to leave their home because of a fear of falling?  No   Prior Function   Level of Independence Independent with basic ADLs   Leisure coloring-markers   ADL   ADL comments Pt has difficulty with fine motor tasks-buttons, zippers, grooming tasks, carrying objects, using writing utensils   Written Expression   Dominant Hand Right   Cognition   Overall Cognitive Status Within Functional Limits for tasks assessed   Observation/Other Assessments   Observations Pt displays slight inflammation in fingers of right hand, most prominent in PIP joint of right middle finger   Coordination   Gross Motor Movements are Fluid and Coordinated Yes  Fine Motor Movements are Fluid and Coordinated Yes   ROM / Strength   AROM / PROM / Strength AROM;Strength   AROM   AROM Assessment Site Wrist   Right/Left Wrist Left   Right Wrist Extension 49 Degrees   Right Wrist Flexion 30 Degrees   Left Wrist Extension 66 Degrees   Left Wrist Flexion 36 Degrees   Strength   Strength Assessment Site Hand   Right/Left hand Right;Left   Right Hand Gross Grasp Functional   Right Hand Grip (lbs) 60   Right Hand Lateral Pinch 13 lbs   Right Hand 3 Point Pinch 10 lbs   Left Hand Gross Grasp Functional   Left Hand Grip (lbs) 75   Left Hand Lateral Pinch 16 lbs   Left Hand 3 Point Pinch 13 lbs                         OT Education - 08/28/15 1125    Education provided Yes   Education Details A/ROM  exercises for wrist, hand, and digits. Contrast bath instructions   Person(s) Educated Patient  social worker   Methods Explanation;Demonstration;Handout   Comprehension Verbalized understanding;Returned demonstration          OT Short Term Goals - 08/28/15 1132    OT SHORT TERM GOAL #1   Title Pt will be educated on and independent in HEP.    Time 1   Period Days   Status Achieved                  Plan - 08/28/15 1126    Clinical Impression Statement A: Pt is a 54 y/o female presenting with bilateral hand pain due to arthritis, which began approximately 1 year ago. Pt reports intermittant tingling, pain, and occassional numbness in bilateral hands, resulting in difficulty completing ADL tasks due to pain and weakness in bilateral hands. Pt's insurance is Medicaid, therefore treatment visits are not covered. Pt was provided with extensive HEP including wrist, hand, and digit A/ROM and constrast bath focusing on decreasing stiffness and pain and increasing fluid movement in bilateral hands. Pt was educated on precautions associated with contrast bath as well.    Pt will benefit from skilled therapeutic intervention in order to improve on the following deficits (Retired) Pain;Impaired UE functional use;Decreased range of motion;Impaired flexibility   OT Frequency 1x / week   OT Duration --  1 visit   OT Treatment/Interventions Patient/family education   Plan P: Pt was educated on HEP focusing on decreasing pain and increasing A/ROM associated with arthritis.    Consulted and Agree with Plan of Care Patient        Problem List Patient Active Problem List   Diagnosis Date Noted  . Depression with anxiety 07/07/2015  . Inflammatory arthritis ([place]) 04/18/2015  . Bilateral hand pain 02/26/2015  . Toe pain, bilateral 02/26/2015  . Pain due to onychomycosis of toenail 12/10/2014  . 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 Rheumatoid arthritis ([place]) 02/12/2012  . ADJ  DISORDER WITH MIXED ANXIETY & DEPRESSED MOOD 08/01/2010  . NICOTINE ADDICTION 04/29/2010  . HAND PAIN, BILATERAL 01/29/2009  . WEIGHT LOSS 04/24/2008  . Essential hypertension 11/24/2007    Guadelupe Sabin, OTR/L  [phone]  08/28/2015, 11:34 AM  [facility] [address] [place], [place], [address] Phone: [phone]   Fax:  [phone]  Name: [name] MRN: [id number] Date of Birth: 1960/10/18

## 2015-08-29 NOTE — Addendum Note (Signed)
Addended by: Guadelupe Sabin A on: 08/29/2015 03:55 PM   Modules accepted: Orders

## 2015-09-11 ENCOUNTER — Ambulatory Visit: Payer: Medicaid Other | Admitting: Family Medicine

## 2015-10-17 ENCOUNTER — Other Ambulatory Visit: Payer: Self-pay | Admitting: Family Medicine

## 2015-11-05 ENCOUNTER — Ambulatory Visit: Payer: Medicaid Other | Admitting: Family Medicine

## 2015-11-06 LAB — CBC
HCT: 38.2 % (ref 36.0–46.0)
Hemoglobin: 12.8 g/dL (ref 12.0–15.0)
MCH: 30.4 pg (ref 26.0–34.0)
MCHC: 33.5 g/dL (ref 30.0–36.0)
MCV: 90.7 fL (ref 78.0–100.0)
MPV: 10.1 fL (ref 8.6–12.4)
Platelets: 294 10*3/uL (ref 150–400)
RBC: 4.21 MIL/uL (ref 3.87–5.11)
RDW: 16.3 % — ABNORMAL HIGH (ref 11.5–15.5)
WBC: 8 10*3/uL (ref 4.0–10.5)

## 2015-11-07 LAB — LIPID PANEL
Cholesterol: 150 mg/dL (ref 125–200)
HDL: 74 mg/dL (ref >=46)
LDL Cholesterol: 68 mg/dL (ref <130)
TRIGLYCERIDES: 38 mg/dL (ref <150)
Total CHOL/HDL Ratio: 2 Ratio (ref <=5.0)
VLDL: 8 mg/dL (ref <30)

## 2015-11-07 LAB — HEPATITIS C ANTIBODY: HCV Ab: NEGATIVE

## 2015-11-13 ENCOUNTER — Ambulatory Visit (INDEPENDENT_AMBULATORY_CARE_PROVIDER_SITE_OTHER): Payer: Medicaid Other | Admitting: Family Medicine

## 2015-11-13 ENCOUNTER — Encounter: Payer: Self-pay | Admitting: Family Medicine

## 2015-11-13 VITALS — BP 128/78 | HR 90 | Resp 16 | Ht 66.0 in | Wt 153.0 lb

## 2015-11-13 DIAGNOSIS — E559 Vitamin D deficiency, unspecified: Secondary | ICD-10-CM | POA: Diagnosis not present

## 2015-11-13 DIAGNOSIS — I10 Essential (primary) hypertension: Secondary | ICD-10-CM | POA: Diagnosis not present

## 2015-11-13 DIAGNOSIS — F17200 Nicotine dependence, unspecified, uncomplicated: Secondary | ICD-10-CM | POA: Diagnosis not present

## 2015-11-13 DIAGNOSIS — R634 Abnormal weight loss: Secondary | ICD-10-CM

## 2015-11-13 DIAGNOSIS — B353 Tinea pedis: Secondary | ICD-10-CM

## 2015-11-13 DIAGNOSIS — F418 Other specified anxiety disorders: Secondary | ICD-10-CM

## 2015-11-13 MED ORDER — TERBINAFINE HCL 250 MG PO TABS: 250.0000 mg | ORAL_TABLET | Freq: Every day | ORAL | Status: DC

## 2015-11-13 NOTE — Assessment & Plan Note (Signed)
Controlled, no change in medication DASH diet and commitment to daily physical activity for a minimum of 30 minutes discussed and encouraged, as a part of hypertension management. The importance of attaining a healthy weight is also discussed.  BP/Weight 11/13/2015 07/05/2015 03/15/2015 01/09/2015 11/29/2014 02/28/2013 [id number]  Systolic BP 0000000 [id number]  Diastolic BP 78 84 82 90 [id number] 82 80  Wt. (Lbs) 153 135 - 121 126 123.12 133  BMI 24.71 21.8 - 19.54 20.35 20.17 21.79

## 2015-11-13 NOTE — Assessment & Plan Note (Signed)

## 2015-11-13 NOTE — Progress Notes (Signed)
Subjective:    Patient ID: [name], female    DOB: May 16, 1961, 55 y.o.   MRN: [id number]  HPI   [name]     MRN: [id number]      DOB: 1961/06/09   HPI [name] is here for follow up and re-evaluation of chronic medical conditions, medication management and review of any available recent lab and radiology data.  Preventive health is updated, specifically  Cancer screening and Immunization.   Questions or concerns regarding consultations or procedures which the PT has had in the interim are  Addressed.Pleased with the surgery she is getting on her feet. [facility] days in a week and is enjoying this a lot, active participant The PT denies any adverse reactions to current medications since the last visit.  There are no new concerns.  There are no specific complaints   ROS Denies recent fever or chills. Denies sinus pressure, nasal congestion, ear pain or sore throat. Denies chest congestion, productive cough or wheezing. Denies chest pains, palpitations and leg swelling Denies abdominal pain, nausea, vomiting,diarrhea or constipation.   Denies dysuria, frequency, hesitancy or incontinence. Denies joint pain, swelling and limitation in mobility. Denies headaches, seizures, numbness, or tingling. Denies depression, anxiety or insomnia.    PE  BP 128/78 mmHg  Pulse 90  Resp 16  Ht 5\' 6"  (1.676 m)  Wt 153 lb (69.4 kg)  BMI 24.71 kg/m2  SpO2 100%  LMP 05/20/2011  Patient alert and oriented and in no cardiopulmonary distress.  HEENT: No facial asymmetry, EOMI,   oropharynx pink and moist.  Neck supple no JVD, no mass.  Chest: Clear to auscultation bilaterally.  CVS: S1, S2 no murmurs, no S3.Regular rate.  ABD: Soft non tender.   Ext: No edema  MS: Adequate ROM spine, shoulders, hips and knees.  Skin: Intact, extensive tinea pedis with scaling of skin  Psych: Good eye contact, normal affect. Memory intact not anxious or depressed  appearing.  CNS: CN 2-12 intact, power,  normal throughout.no focal deficits noted.   Assessment & Plan   Essential hypertension Controlled, no change in medication DASH diet and commitment to daily physical activity for a minimum of 30 minutes discussed and encouraged, as a part of hypertension management. The importance of attaining a healthy weight is also discussed.  BP/Weight 11/13/2015 07/05/2015 03/15/2015 01/09/2015 11/29/2014 02/28/2013 [id number]  Systolic BP 0000000 [id number]  Diastolic BP 78 84 82 90 [id number] 82 80  Wt. (Lbs) 153 135 - 121 126 123.12 133  BMI 24.71 21.8 - 19.54 20.35 20.17 21.79        NICOTINE ADDICTION Patient counseled for approximately 5 minutes regarding the health risks of ongoing nicotine use, specifically all types of cancer, heart disease, stroke and respiratory failure. The options available for help with cessation ,the behavioral changes to assist the process, and the option to either gradully reduce usage  Or abruptly stop.is also discussed. Pt is also encouraged to set specific goals in number of cigarettes used daily, as well as to set a quit date.  Number of cigarettes/cigars currently smoking daily: 5  Quit date is July 1   Tinea pedis Severe disease bilaterally, 4 month course of lamisil prescribed orally  Depression with anxiety Markedly improved and controlled, continue current medication  WEIGHT LOSS Resolved, discontinue megace     Review of Systems     Objective:   Physical Exam  Assessment & Plan:

## 2015-11-13 NOTE — Patient Instructions (Addendum)
CPE end July, call if you need me sooner'   New for fungal foot infection is once daily terbinafine for 4 months  STOP megace  Eat healthily.  Thankful MUCH IMPROVED!  Quit date  for smoking is July 1, you CAN do this   Non fastr CMP, TSH and Vit D in July 1 week before appt pls  You Can Quit Smoking If you are ready to quit smoking or are thinking about it, congratulations! You have chosen to help yourself be healthier and live longer! There are lots of different ways to quit smoking. Nicotine gum, nicotine patches, a nicotine inhaler, or nicotine nasal spray can help with physical craving. Hypnosis, support groups, and medicines help break the habit of smoking. TIPS TO GET OFF AND STAY OFF CIGARETTES  Learn to predict your moods. Do not let a bad situation be your excuse to have a cigarette. Some situations in your life might tempt you to have a cigarette.  Ask friends and co-workers not to smoke around you.  Make your home smoke-free.  Never have "just one" cigarette. It leads to wanting another and another. Remind yourself of your decision to quit.  On a card, make a list of your reasons for not smoking. Read it at least the same number of times a day as you have a cigarette. Tell yourself everyday, "I do not want to smoke. I choose not to smoke."  Ask someone at home or work to help you with your plan to quit smoking.  Have something planned after you eat or have a cup of coffee. Take a walk or get other exercise to perk you up. This will help to keep you from overeating.  Try a relaxation exercise to calm you down and decrease your stress. Remember, you may be tense and nervous the first two weeks after you quit. This will pass.  Find new activities to keep your hands busy. Play with a pen, coin, or rubber band. Doodle or draw things on paper.  Brush your teeth right after eating. This will help cut down the craving for the taste of tobacco after meals. You can try mouthwash  too.  Try gum, breath mints, or diet candy to keep something in your mouth. IF YOU SMOKE AND WANT TO QUIT:  Do not stock up on cigarettes. Never buy a carton. Wait until one pack is finished before you buy another.  Never carry cigarettes with you at work or at home.  Keep cigarettes as far away from you as possible. Leave them with someone else.  Never carry matches or a lighter with you.  Ask yourself, "Do I need this cigarette or is this just a reflex?"  Bet with someone that you can quit. Put cigarette money in a piggy bank every morning. If you smoke, you give up the money. If you do not smoke, by the end of the week, you keep the money.  Keep trying. It takes 21 days to change a habit!  Talk to your doctor about using medicines to help you quit. These include nicotine replacement gum, lozenges, or skin patches.   This information is not intended to replace advice given to you by your health care provider. Make sure you discuss any questions you have with your health care provider.   Document Released: 06/28/2009 Document Revised: 11/24/2011 Document Reviewed: 06/28/2009 Elsevier Interactive Patient Education [employer].

## 2015-11-14 ENCOUNTER — Other Ambulatory Visit: Payer: Self-pay | Admitting: Family Medicine

## 2015-11-18 NOTE — Assessment & Plan Note (Signed)
Resolved, discontinue megace 

## 2015-11-18 NOTE — Assessment & Plan Note (Signed)
Severe disease bilaterally, 4 month course of lamisil prescribed orally

## 2015-11-18 NOTE — Assessment & Plan Note (Signed)
Markedly improved and controlled, continue current medication

## 2015-12-14 ENCOUNTER — Encounter (HOSPITAL_COMMUNITY): Payer: Self-pay

## 2015-12-27 ENCOUNTER — Other Ambulatory Visit: Payer: Self-pay | Admitting: Family Medicine

## 2015-12-27 DIAGNOSIS — Z1231 Encounter for screening mammogram for malignant neoplasm of breast: Secondary | ICD-10-CM

## 2016-01-07 ENCOUNTER — Ambulatory Visit (HOSPITAL_COMMUNITY)
Admission: RE | Admit: 2016-01-07 | Discharge: 2016-01-07 | Disposition: A | Discharge status: Home / Self Care | Payer: Medicaid Other | Source: Ambulatory Visit | Attending: Family Medicine | Admitting: Family Medicine

## 2016-01-07 DIAGNOSIS — Z1231 Encounter for screening mammogram for malignant neoplasm of breast: Secondary | ICD-10-CM | POA: Insufficient documentation

## 2016-01-15 ENCOUNTER — Telehealth: Payer: Self-pay | Admitting: Family Medicine

## 2016-01-15 NOTE — Telephone Encounter (Signed)
Terbinafine is the newer drug, so more likely that Advise her to stop the terbinafine, stay on paxil for now,

## 2016-01-15 NOTE — Telephone Encounter (Signed)
[name] has been breaking out on her face for a few months now and recently it started on her chest. [name] thinks its the paxil and seems like its been happening since she started it but its been working so good for her. Looks like whelps. Has stopped taking the paxil as of today but [name] is scared if she doesn't have something to replace it then she will regress. Please advise.

## 2016-01-15 NOTE — Telephone Encounter (Signed)
[name] is calling stating that PARoxetine (PAXIL) 20 MG tablet  Is causing [name] to break out real bad over entire body, please advise?

## 2016-01-16 NOTE — Telephone Encounter (Signed)
[name] aware and will have patient stop the med and call back if rash persists

## 2016-01-17 ENCOUNTER — Ambulatory Visit (INDEPENDENT_AMBULATORY_CARE_PROVIDER_SITE_OTHER): Payer: Medicaid Other | Admitting: Family Medicine

## 2016-01-17 ENCOUNTER — Encounter: Payer: Self-pay | Admitting: Family Medicine

## 2016-01-17 VITALS — BP 132/82 | HR 70 | Resp 16 | Ht 66.0 in | Wt 154.1 lb

## 2016-01-17 DIAGNOSIS — R591 Generalized enlarged lymph nodes: Secondary | ICD-10-CM | POA: Insufficient documentation

## 2016-01-17 DIAGNOSIS — F418 Other specified anxiety disorders: Secondary | ICD-10-CM

## 2016-01-17 DIAGNOSIS — B369 Superficial mycosis, unspecified: Secondary | ICD-10-CM | POA: Insufficient documentation

## 2016-01-17 DIAGNOSIS — F17200 Nicotine dependence, unspecified, uncomplicated: Secondary | ICD-10-CM

## 2016-01-17 DIAGNOSIS — I10 Essential (primary) hypertension: Secondary | ICD-10-CM

## 2016-01-17 DIAGNOSIS — R221 Localized swelling, mass and lump, neck: Secondary | ICD-10-CM

## 2016-01-17 DIAGNOSIS — R590 Localized enlarged lymph nodes: Secondary | ICD-10-CM

## 2016-01-17 HISTORY — DX: Generalized enlarged lymph nodes: R59.1

## 2016-01-17 MED ORDER — CLOTRIMAZOLE-BETAMETHASONE 1-0.05 % EX CREA: TOPICAL_CREAM | CUTANEOUS | Status: DC

## 2016-01-17 NOTE — Patient Instructions (Addendum)
F/U in July as before. Call if you need me sooner  Rash is not from medication, start back on all medication you have been taking  Cream sent in for rash on breasts  You are referred for [place] of neck to check on the swelling on the right side and lymph glands   [facility]s [facility] back to [facility] and cut back again on cigarettes  You Can Quit Smoking If you are ready to quit smoking or are thinking about it, congratulations! You have chosen to help yourself be healthier and live longer! There are lots of different ways to quit smoking. Nicotine gum, nicotine patches, a nicotine inhaler, or nicotine nasal spray can help with physical craving. Hypnosis, support groups, and medicines help break the habit of smoking. TIPS TO GET OFF AND STAY OFF CIGARETTES  Learn to predict your moods. Do not let a bad situation be your excuse to have a cigarette. Some situations in your life might tempt you to have a cigarette.  Ask friends and co-workers not to smoke around you.  Make your home smoke-free.  Never have "just one" cigarette. It leads to wanting another and another. Remind yourself of your decision to quit.  On a card, make a list of your reasons for not smoking. Read it at least the same number of times a day as you have a cigarette. Tell yourself everyday, "I do not want to smoke. I choose not to smoke."  Ask someone at home or work to help you with your plan to quit smoking.  Have something planned after you eat or have a cup of coffee. Take a walk or get other exercise to perk you up. This will help to keep you from overeating.  Try a relaxation exercise to calm you down and decrease your stress. Remember, you may be tense and nervous the first two weeks after you quit. This will pass.  Find new activities to keep your hands busy. Play with a pen, coin, or rubber band. Doodle or draw things on paper.  Brush your teeth right after eating. This will help cut down the craving for the taste of tobacco  after meals. You can try mouthwash too.  Try gum, breath mints, or diet candy to keep something in your mouth. IF YOU SMOKE AND WANT TO QUIT:  Do not stock up on cigarettes. Never buy a carton. Wait until one pack is finished before you buy another.  Never carry cigarettes with you at work or at home.  Keep cigarettes as far away from you as possible. Leave them with someone else.  Never carry matches or a lighter with you.  Ask yourself, "Do I need this cigarette or is this just a reflex?"  Bet with someone that you can quit. Put cigarette money in a piggy bank every morning. If you smoke, you give up the money. If you do not smoke, by the end of the week, you keep the money.  Keep trying. It takes 21 days to change a habit!  Talk to your doctor about using medicines to help you quit. These include nicotine replacement gum, lozenges, or skin patches.   This information is not intended to replace advice given to you by your health care provider. Make sure you discuss any questions you have with your health care provider.   Document Released: 06/28/2009 Document Revised: 11/24/2011 Document Reviewed: 06/28/2009 Elsevier Interactive Patient Education [employer].

## 2016-01-21 ENCOUNTER — Encounter: Payer: Self-pay | Admitting: Family Medicine

## 2016-01-21 DIAGNOSIS — R221 Localized swelling, mass and lump, neck: Secondary | ICD-10-CM | POA: Insufficient documentation

## 2016-01-21 NOTE — Assessment & Plan Note (Signed)
Non tender mass on right side of neck, max dia approx 2.5 cm, refer for [place]

## 2016-01-21 NOTE — Assessment & Plan Note (Signed)
deteriorated since discontinuing medication,. She is not suicidal or homicidal She will resume meds and [facility]

## 2016-01-21 NOTE — Assessment & Plan Note (Signed)
Fungal rash on chest and anterior neck, topical antifungal/ steroid twice daily for 10 days

## 2016-01-21 NOTE — Assessment & Plan Note (Signed)
Deteriorated, due to increased anxiety and depression off of medication. Pt to resume meds Patient counseled for approximately 5 minutes regarding the health risks of ongoing nicotine use, specifically all types of cancer, heart disease, stroke and respiratory failure. The options available for help with cessation ,the behavioral changes to assist the process, and the option to either gradully reduce usage  Or abruptly stop.is also discussed. Pt is also encouraged to set specific goals in number of cigarettes used daily, as well as to set a quit date.  Number of cigarettes/cigars currently smoking daily: 7 to 10

## 2016-01-21 NOTE — Progress Notes (Signed)
Subjective:    Patient ID: [name], female    DOB: 1961-05-29, 55 y.o.   MRN: [id number]  HPI Pt in for concern of rash on neck and breasts which she attributed to medication and subsequently stopped her antidepressant which she had been doing extremely well on. Since that time , both herself and her Education officer, museum , who accompanies her to visits, have noted a steady decline in her mood and socialization and she herself also states that she has started smoking more cigarettes than she had in the recent past, due to increased anxiety and stress. She also notes non tender swelling on right side of neck which is increasing in size, present for at least 6 weeks. Denies fever , chills , sinus pressure or cough.   Review of Systems See HPI Denies recent fever or chills. Denies sinus pressure, nasal congestion, ear pain or sore throat. Denies chest congestion, productive cough or wheezing. Denies chest pains, palpitations and leg swelling Denies abdominal pain, nausea, vomiting,diarrhea or constipation.   Denies dysuria, frequency, hesitancy or incontinence. Denies joint pain, swelling and limitation in mobility. Denies headaches, seizures, numbness, or tingling.        Objective:   Physical Exam  BP 132/82 mmHg  Pulse 70  Resp 16  Ht 5\' 6"  (1.676 m)  Wt 154 lb 1.9 oz (69.908 kg)  BMI 24.89 kg/m2  SpO2 96%  LMP 05/20/2011 Patient alert and oriented and in no cardiopulmonary distress.  HEENT: No facial asymmetry, EOMI,   oropharynx pink and moist.  Neck supple no JVD, non tender right neck mass, max diameter approx 2.5 cm, also bilateral anterior cervical adenopathy, non tender  Chest: Clear to auscultation bilaterally.Decreased though adequate air entry  CVS: S1, S2 no murmurs, no S3.Regular rate.  ABD: Soft non tender.   Ext: No edema  MS: Adequate ROM spine, shoulders, hips and knees.  Skin: Intact, erythematous fine macular rash on anterior chest and neck  only  Psych: Good eye contact, mild ly flat affect. Memory intact mildly anxious and depressed appearing  CNS: CN 2-12 intact, power,  normal throughout.no focal deficits noted.      Assessment & Plan:  Adenopathy, cervical Bilateral non tender anterior cervical andenopathy with aright neck mass , non tender, diameter approx 2.5 cm, needs imaging for full eval  NICOTINE ADDICTION Deteriorated, due to increased anxiety and depression off of medication. Pt to resume meds Patient counseled for approximately 5 minutes regarding the health risks of ongoing nicotine use, specifically all types of cancer, heart disease, stroke and respiratory failure. The options available for help with cessation ,the behavioral changes to assist the process, and the option to either gradully reduce usage  Or abruptly stop.is also discussed. Pt is also encouraged to set specific goals in number of cigarettes used daily, as well as to set a quit date.  Number of cigarettes/cigars currently smoking daily: 7 to 10   Depression with anxiety deteriorated since discontinuing medication,. She is not suicidal or homicidal She will resume meds [facility]  Dermatomycosis Fungal rash on chest and anterior neck, topical antifungal/ steroid twice daily for 10 days  Essential hypertension Controlled, no change in medication DASH diet and commitment to daily physical activity for a minimum of 30 minutes discussed and encouraged, as a part of hypertension management. The importance of attaining a healthy weight is also discussed.  BP/Weight 01/17/2016 11/13/2015 07/05/2015 03/15/2015 01/09/2015 11/29/2014 [id number]  Systolic BP Q000111Q 0000000  130 140 Q000111Q [id number]  Diastolic BP 82 78 84 82 90 100 82  Wt. (Lbs) 154.12 153 135 - 121 126 123.12  BMI 24.89 24.71 21.8 - 19.54 20.35 20.17        Mass of right side of neck Non tender mass on right side of neck, max dia approx 2.5 cm, refer for [place]

## 2016-01-21 NOTE — Assessment & Plan Note (Signed)
Bilateral non tender anterior cervical andenopathy with aright neck mass , non tender, diameter approx 2.5 cm, needs imaging for full eval

## 2016-01-21 NOTE — Assessment & Plan Note (Signed)
Controlled, no change in medication DASH diet and commitment to daily physical activity for a minimum of 30 minutes discussed and encouraged, as a part of hypertension management. The importance of attaining a healthy weight is also discussed.  BP/Weight 01/17/2016 11/13/2015 07/05/2015 03/15/2015 01/09/2015 11/29/2014 [id number]  Systolic BP Q000111Q 0000000 [id number] Q000111Q [id number]  Diastolic BP 82 78 84 82 90 100 82  Wt. (Lbs) 154.12 153 135 - 121 126 123.12  BMI 24.89 24.71 21.8 - 19.54 20.35 20.17

## 2016-01-28 ENCOUNTER — Ambulatory Visit (HOSPITAL_COMMUNITY)
Admission: RE | Admit: 2016-01-28 | Discharge: 2016-01-28 | Disposition: A | Discharge status: Home / Self Care | Payer: Medicaid Other | Source: Ambulatory Visit | Attending: Family Medicine | Admitting: Family Medicine

## 2016-01-28 ENCOUNTER — Ambulatory Visit (HOSPITAL_COMMUNITY): Payer: Medicaid Other

## 2016-01-28 DIAGNOSIS — R221 Localized swelling, mass and lump, neck: Secondary | ICD-10-CM | POA: Insufficient documentation

## 2016-01-28 DIAGNOSIS — R590 Localized enlarged lymph nodes: Secondary | ICD-10-CM | POA: Insufficient documentation

## 2016-02-04 ENCOUNTER — Other Ambulatory Visit (HOSPITAL_COMMUNITY): Payer: Self-pay | Admitting: Oncology

## 2016-02-04 ENCOUNTER — Other Ambulatory Visit: Payer: Self-pay | Admitting: Family Medicine

## 2016-02-04 DIAGNOSIS — F17200 Nicotine dependence, unspecified, uncomplicated: Secondary | ICD-10-CM

## 2016-02-04 DIAGNOSIS — R221 Localized swelling, mass and lump, neck: Secondary | ICD-10-CM

## 2016-02-04 DIAGNOSIS — R590 Localized enlarged lymph nodes: Secondary | ICD-10-CM

## 2016-02-05 ENCOUNTER — Other Ambulatory Visit (HOSPITAL_COMMUNITY): Payer: Self-pay | Admitting: Oncology

## 2016-02-05 DIAGNOSIS — R221 Localized swelling, mass and lump, neck: Secondary | ICD-10-CM

## 2016-02-05 DIAGNOSIS — R590 Localized enlarged lymph nodes: Secondary | ICD-10-CM

## 2016-02-06 ENCOUNTER — Ambulatory Visit (HOSPITAL_COMMUNITY)
Admission: RE | Admit: 2016-02-06 | Discharge: 2016-02-06 | Disposition: A | Discharge status: Home / Self Care | Payer: Medicaid Other | Source: Ambulatory Visit | Attending: Oncology | Admitting: Oncology

## 2016-02-06 DIAGNOSIS — R221 Localized swelling, mass and lump, neck: Secondary | ICD-10-CM | POA: Diagnosis present

## 2016-02-06 DIAGNOSIS — J3489 Other specified disorders of nose and nasal sinuses: Secondary | ICD-10-CM | POA: Insufficient documentation

## 2016-02-06 DIAGNOSIS — R590 Localized enlarged lymph nodes: Secondary | ICD-10-CM | POA: Insufficient documentation

## 2016-02-06 DIAGNOSIS — J312 Chronic pharyngitis: Secondary | ICD-10-CM | POA: Diagnosis not present

## 2016-02-06 DIAGNOSIS — E041 Nontoxic single thyroid nodule: Secondary | ICD-10-CM | POA: Diagnosis not present

## 2016-02-06 MED ORDER — IOPAMIDOL (ISOVUE-300) INJECTION 61%
75.0000 mL | Freq: Once | INTRAVENOUS | Status: AC | PRN
  Administered 2016-02-06: 75 mL via INTRAVENOUS

## 2016-02-07 ENCOUNTER — Telehealth (HOSPITAL_COMMUNITY): Payer: Self-pay | Admitting: *Deleted

## 2016-02-07 ENCOUNTER — Encounter (HOSPITAL_COMMUNITY): Payer: Self-pay | Admitting: Hematology & Oncology

## 2016-02-07 ENCOUNTER — Encounter (HOSPITAL_COMMUNITY): Payer: Medicaid Other | Attending: Hematology & Oncology | Admitting: Hematology & Oncology

## 2016-02-07 VITALS — BP 151/89 | HR 86 | Temp 99.3°F | Resp 20 | Ht 65.75 in | Wt 156.0 lb

## 2016-02-07 DIAGNOSIS — Z720 Tobacco use: Secondary | ICD-10-CM | POA: Diagnosis not present

## 2016-02-07 DIAGNOSIS — R591 Generalized enlarged lymph nodes: Secondary | ICD-10-CM | POA: Diagnosis not present

## 2016-02-07 LAB — SEDIMENTATION RATE: SED RATE: 55 mm/h — AB (ref 0–22)

## 2016-02-07 LAB — C-REACTIVE PROTEIN: CRP: 1.2 mg/dL — AB (ref <1.0)

## 2016-02-07 LAB — LACTATE DEHYDROGENASE: LDH: 259 U/L — AB (ref 98–192)

## 2016-02-07 NOTE — Patient Instructions (Addendum)
[facility] Discharge Instructions  RECOMMENDATIONS MADE BY THE CONSULTANT AND ANY TEST RESULTS WILL BE SENT TO YOUR REFERRING PHYSICIAN.  Labs today  CT scans to be scheduled  Return after CT scans to see Tom the physician assistant  Thank you for choosing [facility] at [facility] to provide your oncology and hematology care.  To afford each patient quality time with our provider, please arrive at least 15 minutes before your scheduled appointment time.   Beginning January 23rd 2017 lab work for the [facility] will be done in the  Main lab at [facility] on 1st floor. If you have a lab appointment with the [facility] please come in thru the  Main Entrance and check in at the main information desk  You need to re-schedule your appointment should you arrive 10 or more minutes late.  We strive to give you quality time with our providers, and arriving late affects you and other patients whose appointments are after yours.  Also, if you no show three or more times for appointments you may be dismissed from the clinic at the providers discretion.     Again, thank you for choosing [name].  Our hope is that these requests will decrease the amount of time that you wait before being seen by our physicians.       _____________________________________________________________  Should you have questions after your visit to [facility], please contact our office at [phone] between the hours of 8:30 a.m. and 4:30 p.m.  Voicemails left after 4:30 p.m. will not be returned until the following business day.  For prescription refill requests, have your pharmacy contact our office.         Resources For Cancer Patients and their Caregivers ? American Cancer Society: Can assist with transportation, wigs, general needs, runs Look Good Feel Better.        786-668-6284 ? Cancer Care: Provides financial  assistance, online support groups, medication/co-pay assistance.  1-800-813-HOPE 680-867-5929) ? [place] Assists [place] cancer patients and their families through emotional , educational and financial support.  6094283246 ? [place] DSS Where to apply for food stamps, Medicaid and utility assistance. (321)289-4976 ? [facility]: Transportation to medical appointments. (857)570-5551 ? Social Security Administration: May apply for disability if have a Stage IV cancer. (828)578-6231 564-406-4778 ? [employer], Disability and Transit Services: Assists with nutrition, care and transit needs. [place] Support Programs: @10RELATIVEDAYS @ > Cancer Support Group  2nd Tuesday of the month 1pm-2pm, Journey Room  > Creative Journey  3rd Tuesday of the month 1130am-1pm, Journey Room  > Look Good Feel Better  1st Wednesday of the month 10am-12 noon, Journey Room (Call [place] to register [phone])

## 2016-02-07 NOTE — Progress Notes (Signed)
[place]  CONSULT NOTE  Patient Care Team: Lori Helper, MD as PCP - General Lori Binder, MD (Gastroenterology) Lori Spring, MD as Referring Physician (Internal Medicine)  CHIEF COMPLAINTS/PURPOSE OF CONSULTATION:  Lymphadenopathy  HISTORY OF PRESENTING ILLNESS:  [name] 55 y.o. female is here because of lymphadenopathy. She has been referred by Dr. Moshe Fry. She comes to the [facility] today for consult accompanied by her case worker, [name].  She says "I'm here, thank God."  She reports swollen lymph nodes in her neck, which she says she first noticed in 2015. Says "it was small" then. Confirms that it's been changing in size. Dr. Moshe Fry has been her doctor for "some years." when she went to see her this past time, [name] showed her the swollen nodes. The one the right side of her neck bothers her the most. She was referred for an ultrasound of the neck on 5/15 that showed bilateral lymphadenopathy. Largest LN in the R neck with diameter of 1.5 cm.   She says she used to eat 3-4 times a day, but now 1-2. She says she gets full quick, but she's still eating. She also notes that she's gained weight. Her case work reports that Dr. Moshe Fry but her on Megace to help her gain some weight and she gained 27 pounds, so she is now off of that.  Vitals - 1 value per visit 02/07/2016 01/17/2016 11/13/2015 07/05/2015  Weight (lb) 156 154.12 153 135   Vitals - 1 value per visit 03/15/2015 01/09/2015 11/29/2014 02/28/2013  Weight (lb)  121 126 123.12    [name] says she's doing all right. She denies any fevers or drenching night sweats. When asked if she notices these lumps anywhere else, she says "they move; it feels like some are stretching and popping in the side of my neck." She also says "it was sharp pain" in her neck, "but now I don't feel no popping."  She says she does receive mammograms of her breasts and that she has had a colonoscopy. She says she  tries to do everything she's supposed to.  She wants to know if smoking cigarettes can cause the swollen lymph nodes, and was advised that we need to look for a bunch of different explanations. She says she has always known that smoking is bad for her but she has continued to do it anyway. However, she confirms that she is trying to cut back.  She does not have any questions today, and at the end of her appointment, says "thank you."   MEDICAL HISTORY:  Past Medical History  Diagnosis Date  . Chronic abdominal pain     resolved  . Hypertension   . Psychosis   . Nicotine addiction   . Anxiety   . H. pylori infection 2/09    s/p prevpac    SURGICAL HISTORY: Past Surgical History  Procedure Laterality Date  . Surgery of left middle finger on left hand (childhood )    . Tubal ligation    . Cholecystectomy    . Colonoscopy  02/07/2011    SLF: Slightly tortuous colon.  Otherwise normal colon without evidence of polyps, masses, inflammatory changes, diverticular AVMs    SOCIAL HISTORY: Social History   Social History  . Marital Status: Single    Spouse Name: N/A  . Number of Children: 2  . Years of Education: N/A   Occupational History  . disabled    Social History  Main Topics  . Smoking status: Current Every Day Smoker -- 0.25 packs/day for 16 years    Types: Cigarettes  . Smokeless tobacco: Not on file     Comment: 5 per day   . Alcohol Use: No  . Drug Use: No  . Sexual Activity: No   Other Topics Concern  . Not on file   Social History Narrative   Lives alone   Born here in [place]. She lives [address], [address] here in [place]. Lives by herself. Here today with her case worker, [name]. Smokes 8-9 cigarettes; is down to between 5-6 cigarettes. Been smoking since she was 17. Heaviest smoking was close to 12 a day, "some years back." She used to drink alcohol; notes that she drinks some beer every so often, not every day.  She  enjoys walking, reading; goes to [employer] to check out books. Likes going to the clubhouse in [place], in [place].  FAMILY HISTORY: Family History  Problem Relation Age of Onset  . Cancer Mother    Mother deceased; died from cancer, doesn't know the kind. She was 37 when she died. Father is deceased as far as she knows; isn't sure. Sister and brother. Sister is 110, brother is 3. Knows that her sister smokes cigarettes and drinks too.  ALLERGIES:  has No Known Allergies.  MEDICATIONS:  Current Outpatient Prescriptions  Medication Sig Dispense Refill  . amLODipine (NORVASC) 10 MG tablet TAKE 1 TABLET BY MOUTH ONCE DAILY. 30 tablet 3  . clotrimazole-betamethasone (LOTRISONE) cream Apply twice daily t [place] breasts for 10 days 45 g 1  . PARoxetine (PAXIL) 20 MG tablet TAKE 1 TABLET BY MOUTH ONCE A DAY. 30 tablet 3  . terbinafine (LAMISIL) 250 MG tablet Take 1 tablet (250 mg total) by mouth daily. 30 tablet 3  . [DISCONTINUED] fluticasone (FLONASE) 50 MCG/ACT nasal spray Place 1 spray into the nose daily. 16 g 4   No current facility-administered medications for this visit.    Review of Systems  Constitutional: Negative.  Negative for fever, chills, weight loss and malaise/fatigue.  HENT: Negative.  Negative for congestion, hearing loss, nosebleeds, sore throat and tinnitus.   Eyes: Negative.  Negative for blurred vision, double vision, pain and discharge.  Respiratory: Negative.  Negative for cough, hemoptysis, sputum production, shortness of breath and wheezing.   Cardiovascular: Negative.  Negative for chest pain, palpitations, claudication, leg swelling and PND.  Gastrointestinal: Negative.  Negative for heartburn, nausea, vomiting, abdominal pain, diarrhea, constipation, blood in stool and melena.  Genitourinary: Negative.  Negative for dysuria, urgency, frequency and hematuria.  Musculoskeletal: Negative.  Negative for myalgias, joint pain and falls.  Skin: Negative.   Negative for itching and rash.  Neurological: Negative.  Negative for dizziness, tingling, tremors, sensory change, speech change, focal weakness, seizures, loss of consciousness, weakness and headaches.  Endo/Heme/Allergies: Negative.  Does not bruise/bleed easily.  Psychiatric/Behavioral: Negative for depression, suicidal ideas, memory loss and substance abuse. The patient is nervous/anxious. The patient does not have insomnia.   All other systems reviewed and are negative.  14 point ROS was done and is otherwise as detailed above or in HPI  PHYSICAL EXAMINATION: ECOG PERFORMANCE STATUS: 0 - Asymptomatic  Filed Vitals:   02/07/16 1535  BP: 151/89  Pulse: 86  Temp: 99.3 F (37.4 C)  Resp: 20   Filed Weights   02/07/16 1535  Weight: 156 lb (70.761 kg)     Physical Exam  Constitutional: She is  oriented to person, place, and time and well-developed, well-nourished, and in no distress.  HENT:  Head: Normocephalic and atraumatic.  Nose: Nose normal.  Mouth/Throat: Oropharynx is clear and moist. No oropharyngeal exudate.  Eyes: Conjunctivae and EOM are normal. Pupils are equal, round, and reactive to light. Right eye exhibits no discharge. Left eye exhibits no discharge. No scleral icterus.  Neck: Normal range of motion. Neck supple. No tracheal deviation present. No thyromegaly present.  Cardiovascular: Normal rate, regular rhythm, normal heart sounds and intact distal pulses.  Exam reveals no gallop and no friction rub.   No murmur heard. Pulmonary/Chest: Effort normal and breath sounds normal. She has no wheezes. She has no rales.  Abdominal: Soft. Bowel sounds are normal. She exhibits no distension and no mass. There is no tenderness. There is no rebound and no guarding.  Musculoskeletal: Normal range of motion. She exhibits no edema.  Lymphadenopathy:    She has cervical adenopathy.       Right cervical: Superficial cervical adenopathy present.       Left cervical:  Superficial cervical adenopathy present.    She has axillary adenopathy.       Right: Inguinal adenopathy present.  Neurological: She is alert and oriented to person, place, and time. She has normal reflexes. No cranial nerve deficit. Gait normal. Coordination normal.  Skin: Skin is warm and dry. No rash noted.  Psychiatric: Mood, memory, affect and judgment normal.  Nursing note and vitals reviewed.   LABORATORY DATA:  I have reviewed the data as listed Lab Results  Component Value Date   WBC 8.0 11/06/2015   HGB 12.8 11/06/2015   HCT 38.2 11/06/2015   MCV 90.7 11/06/2015   PLT 294 11/06/2015   CMP     Component Value Date/Time   NA 140 11/29/2014 1021   K 3.9 11/29/2014 1021   CL 105 11/29/2014 1021   CO2 24 11/29/2014 1021   GLUCOSE 86 11/29/2014 1021   BUN 9 11/29/2014 1021   CREATININE 0.49* 11/29/2014 1021   CREATININE 0.58 08/01/2010 2034   CALCIUM 9.9 11/29/2014 1021   PROT 7.5 11/29/2014 1021   ALBUMIN 4.4 11/29/2014 1021   AST 24 11/29/2014 1021   ALT 16 11/29/2014 1021   ALKPHOS 132* 11/29/2014 1021   BILITOT 0.4 11/29/2014 1021   GFRNONAA >89 11/29/2014 1021   GFRNONAA >60 08/29/2008 1133   GFRAA >89 11/29/2014 1021   GFRAA  08/29/2008 1133    >60        The eGFR has been calculated using the MDRD equation. This calculation has not been validated in all clinical     RADIOGRAPHIC STUDIES: I have personally reviewed the radiological images as listed and agreed with the findings in the report. Ct Soft Tissue Neck W Contrast  02/07/2016  CLINICAL DATA:  Cervical adenopathy.  Right neck mass EXAM: CT NECK WITH CONTRAST TECHNIQUE: Multidetector CT imaging of the neck was performed using the standard protocol following the bolus administration of intravenous contrast. CONTRAST:  19m ISOVUE-300 IOPAMIDOL (ISOVUE-300) INJECTION 61% COMPARISON:  None. FINDINGS: Pharynx and larynx: Mild prominence of the palatine tonsils bilaterally. This is symmetric and without  mass lesion. The airway intact. Epiglottis and larynx normal. Salivary glands: Parotid and submandibular glands normal. No mass or stone. Thyroid: Right thyroid nodule posteriorly measuring 12 mm. Heterogeneous thyroid gland normal in size. Lymph nodes: Prominent lymph nodes in the neck bilaterally. Right submental node 10 mm. Right level 2 nodes 9 mm, and 10 mm.  Right submandibular node 10 mm. 10 mm posterior lymph node at the level of the thyroid cartilage. Small supraclavicular nodes on the right. Left submandibular node 17 mm. Multiple posterior nodes on the left measuring 9 in 12 mm and below. Left level 3 node 12 mm. Multiple supraclavicular nodes measuring under 1 cm. Small axillary nodes bilaterally. Vascular: Carotid artery and jugular vein patent bilaterally. 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 No acute intracranial abnormality. Visualized orbits: Negative Mastoids and visualized paranasal sinuses: Complete opacification left maxillary sinus with mild bony thickening suggesting chronic obstruction. Mucosal edema in the left sphenoid sinus. Right maxillary sinus clear. Ethmoid and sphenoid sinuses clear. Mastoid sinus is clear. Skeleton: Mild cervical spine degenerative change. No fracture or mass lesion. Upper chest: Lung apices clear.  Mild apical scarring bilaterally. IMPRESSION: Palatine tonsils are prominent but symmetric bilaterally without mass lesion. Question chronic pharyngitis. Cervical adenopathy. Numerous prominent lymph nodes bilaterally. These could be reactive nodes versus lymphoma. Biopsy recommended if these do not clear with conservative treatment. Chronic obstruction left maxillary sinus 12 mm right thyroid nodule.  Thyroid ultrasound recommended. Electronically Signed   By: Franchot Gallo M.D.   On: 02/07/2016 07:11    Study Result     CLINICAL DATA: Cervical adenopathy. Right neck mass  EXAM: CT NECK WITH  CONTRAST  TECHNIQUE: Multidetector CT imaging of the neck was performed using the standard protocol following the bolus administration of intravenous contrast.  CONTRAST: 55m ISOVUE-300 IOPAMIDOL (ISOVUE-300) INJECTION 61%  COMPARISON: None.  FINDINGS: Pharynx and larynx: Mild prominence of the palatine tonsils bilaterally. This is symmetric and without mass lesion. The airway intact. Epiglottis and larynx normal.  Salivary glands: Parotid and submandibular glands normal. No mass or stone.  Thyroid: Right thyroid nodule posteriorly measuring 12 mm. Heterogeneous thyroid gland normal in size.  Lymph nodes: Prominent lymph nodes in the neck bilaterally.  Right submental node 10 mm. Right level 2 nodes 9 mm, and 10 mm. Right submandibular node 10 mm. 10 mm posterior lymph node at the level of the thyroid cartilage. Small supraclavicular nodes on the right.  Left submandibular node 17 mm. Multiple posterior nodes on the left measuring 9 in 12 mm and below. Left level 3 node 12 mm. Multiple supraclavicular nodes measuring under 1 cm. Small axillary nodes bilaterally.  Vascular: Carotid artery and jugular vein patent bilaterally.  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 No acute intracranial abnormality.  Visualized orbits: Negative  Mastoids and visualized paranasal sinuses: Complete opacification left maxillary sinus with mild bony thickening suggesting chronic obstruction. Mucosal edema in the left sphenoid sinus. Right maxillary sinus clear. Ethmoid and sphenoid sinuses clear. Mastoid sinus is clear.  Skeleton: Mild cervical spine degenerative change. No fracture or mass lesion.  Upper chest: Lung apices clear. Mild apical scarring bilaterally.  IMPRESSION: Palatine tonsils are prominent but symmetric bilaterally without mass lesion. Question chronic pharyngitis.  Cervical adenopathy. Numerous  prominent lymph nodes bilaterally. These could be reactive nodes versus lymphoma. Biopsy recommended if these do not clear with conservative treatment.  Chronic obstruction left maxillary sinus  12 mm right thyroid nodule. Thyroid ultrasound recommended.   Electronically Signed  By: CFranchot GalloM.D.  On: 02/07/2016 07:11    ASSESSMENT & PLAN:  Generalized Lymphadenopathy Tobacco Use   She has evidence of generalized LAD with obvious axillary, inguinal and supraclavicular and neck adenopathy on exam. Largest LN are axillary. She is asymptomatic. CT of the neck was  reviewed with the patient in detail.   I have recommended the studies detailed below:  Orders Placed This Encounter  Procedures  . CT Abdomen Pelvis W Contrast    Standing Status: Future     Number of Occurrences:      Standing Expiration Date: 02/06/2017    Order Specific Question:  If indicated for the ordered procedure, I authorize the administration of contrast media per Radiology protocol    Answer:  Yes    Order Specific Question:  Reason for Exam (SYMPTOM  OR DIAGNOSIS REQUIRED)    Answer:  adenopathy    Order Specific Question:  Is the patient pregnant?    Answer:  No    Order Specific Question:  Preferred imaging location?    Answer:  [facility]  . CT Chest W Contrast    Standing Status: Future     Number of Occurrences:      Standing Expiration Date: 02/06/2017    Order Specific Question:  If indicated for the ordered procedure, I authorize the administration of contrast media per Radiology protocol    Answer:  Yes    Order Specific Question:  Reason for Exam (SYMPTOM  OR DIAGNOSIS REQUIRED)    Answer:  adenopathy, neck, supraclavicular, axillary regions    Order Specific Question:  Is the patient pregnant?    Answer:  No    Order Specific Question:  Preferred imaging location?    Answer:  [facility]  . Sedimentation rate  . HIV antibody (with reflex)  . Angiotensin  converting enzyme  . Cyclic Citrul Peptide Antibody, IGG  . Rheumatoid factor  . C-reactive protein  . Lactate dehydrogenase  . CYCLIC CITRUL PEPTIDE ANTIBODY, IGG/IGA   If unrevealing we will then proceed with LN biopsy.  I discussed this with the patient and her caregiver in detail. She is agreeable with the plan as outlined. She will return post CT scans and once all labs are available for review.   MEDICATIONS PRESCRIBED THIS ENCOUNTER: No orders of the defined types were placed in this encounter.    All questions were answered. The patient knows to call the clinic with any problems, questions or concerns.  This document serves as a record of services personally performed by Ancil Linsey, MD. It was created on her behalf by Toni Amend, a trained medical scribe. The creation of this record is based on the scribe's personal observations and the provider's statements to them. This document has been checked and approved by the attending provider.  I have reviewed the above documentation for accuracy and completeness and I agree with the above.  This note was electronically signed.    Molli Hazard, MD  02/07/2016 3:59 PM

## 2016-02-08 ENCOUNTER — Telehealth: Payer: Self-pay | Admitting: Family Medicine

## 2016-02-08 LAB — CYCLIC CITRUL PEPTIDE ANTIBODY, IGG/IGA: CCP Antibodies IgG/IgA: 9 units (ref 0–19)

## 2016-02-08 LAB — RHEUMATOID FACTOR: Rhuematoid fact SerPl-aCnc: 11.3 IU/mL (ref 0.0–13.9)

## 2016-02-08 LAB — HIV ANTIBODY (ROUTINE TESTING W REFLEX): HIV Screen 4th Generation wRfx: NONREACTIVE

## 2016-02-08 LAB — ANGIOTENSIN CONVERTING ENZYME: ANGIOTENSIN-CONVERTING ENZYME: 44 U/L (ref 14–82)

## 2016-02-08 NOTE — Telephone Encounter (Signed)
[name] Lisas social worker is calling stating that [name] is complaining that she needs some medication because all she wants to do is have sex all the time, please advise?

## 2016-02-08 NOTE — Telephone Encounter (Signed)
I recommend starting therapy , going to psychiatrist/ counsellor, will refer if she agrees. Counseling will be benficial to divert her current focus,

## 2016-02-08 NOTE — Telephone Encounter (Signed)
Left message for jennifer to speak with the patient and call me back

## 2016-02-10 ENCOUNTER — Encounter (HOSPITAL_COMMUNITY): Payer: Self-pay | Admitting: Hematology & Oncology

## 2016-02-13 ENCOUNTER — Telehealth (HOSPITAL_COMMUNITY): Payer: Self-pay | Admitting: Oncology

## 2016-02-13 NOTE — Telephone Encounter (Signed)
CT CAP with contrast went in to review by insurance company for approval.  I completed a Peer to peer review.  Despite some arguing, I was able to get CT CAP w contrast approved.  Approval code is [id number].   Lynnette Pote, PA-C 02/13/2016 12:30 PM

## 2016-02-14 ENCOUNTER — Other Ambulatory Visit: Payer: Self-pay | Admitting: Family Medicine

## 2016-02-14 ENCOUNTER — Ambulatory Visit (HOSPITAL_COMMUNITY)
Admission: RE | Admit: 2016-02-14 | Discharge: 2016-02-14 | Disposition: A | Discharge status: Home / Self Care | Payer: Medicaid Other | Source: Ambulatory Visit | Attending: Hematology & Oncology | Admitting: Hematology & Oncology

## 2016-02-14 DIAGNOSIS — I700 Atherosclerosis of aorta: Secondary | ICD-10-CM | POA: Diagnosis not present

## 2016-02-14 DIAGNOSIS — R591 Generalized enlarged lymph nodes: Secondary | ICD-10-CM | POA: Diagnosis present

## 2016-02-14 DIAGNOSIS — R918 Other nonspecific abnormal finding of lung field: Secondary | ICD-10-CM | POA: Insufficient documentation

## 2016-02-14 MED ORDER — IOPAMIDOL (ISOVUE-300) INJECTION 61%
100.0000 mL | Freq: Once | INTRAVENOUS | Status: AC | PRN
  Administered 2016-02-14: 100 mL via INTRAVENOUS

## 2016-02-18 ENCOUNTER — Ambulatory Visit (HOSPITAL_COMMUNITY): Payer: Medicaid Other | Admitting: Oncology

## 2016-02-19 ENCOUNTER — Encounter (HOSPITAL_COMMUNITY): Payer: Medicaid Other | Attending: Hematology & Oncology | Admitting: Oncology

## 2016-02-19 ENCOUNTER — Encounter (HOSPITAL_COMMUNITY): Payer: Self-pay | Admitting: Oncology

## 2016-02-19 ENCOUNTER — Encounter (HOSPITAL_COMMUNITY): Payer: Self-pay | Admitting: Lab

## 2016-02-19 VITALS — BP 123/78 | HR 78 | Temp 98.6°F | Resp 16 | Wt 154.9 lb

## 2016-02-19 DIAGNOSIS — R591 Generalized enlarged lymph nodes: Secondary | ICD-10-CM | POA: Diagnosis not present

## 2016-02-19 NOTE — Progress Notes (Signed)
Tula Nakayama, MD [address] [place] [address]  Lymphadenopathy  CURRENT THERAPY: Work-up  INTERVAL HISTORY: [name] 55 y.o. female returns for followup of lymphadenopathy.  I personally reviewed and went over laboratory results with the patient.  The results are noted within this dictation.  I personally reviewed and went over radiographic studies with the patient.  The results are noted within this dictation.  CT CAP demonstrates enlarged bilateral axillary, left supraclavicular and right hilar lymph nodes. There also borderline, pathologically enlarged pelvic and inguinal lymph nodes. Although nonspecific findings may reflect underline lymphoproliferative disorder, metastatic adenopathy or granulomatous inflammation or infection.   The patient is provided information regarding lymphadenopathy. I discussed potential causes of enlarged lymph nodes, including but not limited to, infection, inflammatory disorders, granulomatous disease, metastatic disease, lymphoma/leukemia, lymphoproliferative disorders, etc.  I provided patient education regarding role of lymph nodes within our body.  I described the role of biopsy and the role of Gen Surg and Pathology in her progression through her work-up.  Review of Systems  Constitutional: Negative.   HENT: Negative.   Eyes: Negative.   Respiratory: Negative.   Cardiovascular: Negative.   Gastrointestinal: Negative.   Genitourinary: Negative.   Musculoskeletal: Negative.   Skin: Negative.   Neurological: Negative.   Endo/Heme/Allergies: Negative.   Psychiatric/Behavioral: Negative.     Past Medical History  Diagnosis Date  . Chronic abdominal pain     resolved  . Hypertension   . Psychosis   . Nicotine addiction   . Anxiety   . H. pylori infection 2/09    s/p prevpac  . Lymphadenopathy 01/17/2016    Past Surgical History  Procedure Laterality Date  . Surgery of left middle finger on left hand  (childhood )    . Tubal ligation    . Cholecystectomy    . Colonoscopy  02/07/2011    SLF: Slightly tortuous colon.  Otherwise normal colon without evidence of polyps, masses, inflammatory changes, diverticular AVMs    Family History  Problem Relation Age of Onset  . Cancer Mother     Social History   Social History  . Marital Status: Single    Spouse Name: N/A  . Number of Children: 2  . Years of Education: N/A   Occupational History  . disabled    Social History Main Topics  . Smoking status: Current Every Day Smoker -- 0.25 packs/day for 16 years    Types: Cigarettes  . Smokeless tobacco: None     Comment: 5 per day   . Alcohol Use: No  . Drug Use: No  . Sexual Activity: No   Other Topics Concern  . None   Social History Narrative   Lives alone     PHYSICAL EXAMINATION  ECOG PERFORMANCE STATUS: 1 - Symptomatic but completely ambulatory  Filed Vitals:   02/19/16 0855  BP: 123/78  Pulse: 78  Temp: 98.6 F (37 C)  Resp: 16    GENERAL:alert, no distress, well nourished, well developed, comfortable, cooperative, obese, smiling and accompanied by her Education officer, museum. SKIN: skin color, texture, turgor are normal, no rashes or significant lesions HEAD: Normocephalic, No masses, lesions, tenderness or abnormalities EYES: normal, EOMI, Conjunctiva are pink and non-injected EARS: External ears normal OROPHARYNX:lips, buccal mucosa, and tongue normal and mucous membranes are moist  NECK: supple, trachea midline, right anterior cervical chain lymph node palpated measuring about 1 cm in size.  Left supraclavicular node measuring about  0.5 cm is notes at mid-clavicular line. LYMPH:  enlarged lymph nodes palpated right anterior cervical neck, left supraclavicular, 1 cm bilateral axillary lymph nodes at mid-axillary line (deep). BREAST:not examined LUNGS: clear to auscultation and percussion HEART: regular rate & rhythm, no murmurs, no gallops, S1 normal and S2  normal ABDOMEN:abdomen soft, obese and normal bowel sounds BACK: Back symmetric, no curvature., No CVA tenderness EXTREMITIES:less then 2 second capillary refill, no joint deformities, effusion, or inflammation, no skin discoloration, no clubbing, no cyanosis  NEURO: alert & oriented x 3 with fluent speech, no focal motor/sensory deficits, gait normal   LABORATORY DATA: CBC    Component Value Date/Time   WBC 8.0 11/06/2015 0929   RBC 4.21 11/06/2015 0929   HGB 12.8 11/06/2015 0929   HCT 38.2 11/06/2015 0929   PLT 294 11/06/2015 0929   MCV 90.7 11/06/2015 0929   MCH 30.4 11/06/2015 0929   MCHC 33.5 11/06/2015 0929   RDW 16.3* 11/06/2015 0929   LYMPHSABS 4.0 11/27/2011 1537   MONOABS 0.5 11/27/2011 1537   EOSABS 0.2 11/27/2011 1537   BASOSABS 0.0 11/27/2011 1537      Chemistry      Component Value Date/Time   NA 140 11/29/2014 1021   K 3.9 11/29/2014 1021   CL 105 11/29/2014 1021   CO2 24 11/29/2014 1021   BUN 9 11/29/2014 1021   CREATININE 0.49* 11/29/2014 1021   CREATININE 0.58 08/01/2010 2034      Component Value Date/Time   CALCIUM 9.9 11/29/2014 1021   ALKPHOS 132* 11/29/2014 1021   AST 24 11/29/2014 1021   ALT 16 11/29/2014 1021   BILITOT 0.4 11/29/2014 1021     Results for Jerde, Mayleigh A (MRN [id number]) as of 02/19/2016 10:08  Ref. Range 02/07/2016 15:50  LDH Latest Ref Range: 98-192 U/L 259 (H)   Results for FENNA, FENNEMA (MRN [id number]) as of 02/19/2016 10:08  Ref. Range 02/07/2016 15:50  CRP Latest Ref Range: <1.0 mg/dL 1.2 (H)   Results for RUDI, GRANDBERRY (MRN [id number]) as of 02/19/2016 10:08  Ref. Range 02/07/2016 15:50  Sed Rate Latest Ref Range: 0-22 mm/hr 55 (H)  Results for KYMBERLEY, VERBEEK (MRN [id number]) as of 02/19/2016 10:08  Ref. Range 02/07/2016 15:50  Angiotensin-Converting Enzyme Latest Ref Range: 14-82 U/L 44  Results for Bogue, Herta A (MRN [id number]) as of 02/19/2016 10:08  Ref. Range 02/07/2016 15:00  CCP Antibodies IgG/IgA Latest Ref Range: 0-19 units 9   Results for RENEZMAE, DENKER (MRN [id number]) as of 02/19/2016 10:08  Ref. Range 02/07/2016 15:50  RA Latex Turbid. Latest Ref Range: 0.0-13.9 IU/mL 11.3   PENDING LABS:   RADIOGRAPHIC STUDIES:  Ct Soft Tissue Neck W Contrast  02/07/2016  CLINICAL DATA:  Cervical adenopathy.  Right neck mass EXAM: CT NECK WITH CONTRAST TECHNIQUE: Multidetector CT imaging of the neck was performed using the standard protocol following the bolus administration of intravenous contrast. CONTRAST:  85mL ISOVUE-300 IOPAMIDOL (ISOVUE-300) INJECTION 61% COMPARISON:  None. FINDINGS: Pharynx and larynx: Mild prominence of the palatine tonsils bilaterally. This is symmetric and without mass lesion. The airway intact. Epiglottis and larynx normal. Salivary glands: Parotid and submandibular glands normal. No mass or stone. Thyroid: Right thyroid nodule posteriorly measuring 12 mm. Heterogeneous thyroid gland normal in size. Lymph nodes: Prominent lymph nodes in the neck bilaterally. Right submental node 10 mm. Right level 2 nodes 9 mm, and 10 mm. Right submandibular node 10 mm. 10 mm posterior lymph node at the level of  the thyroid cartilage. Small supraclavicular nodes on the right. Left submandibular node 17 mm. Multiple posterior nodes on the left measuring 9 in 12 mm and below. Left level 3 node 12 mm. Multiple supraclavicular nodes measuring under 1 cm. Small axillary nodes bilaterally. Vascular: Carotid artery and jugular vein patent bilaterally. 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 No acute intracranial abnormality. Visualized orbits: Negative Mastoids and visualized paranasal sinuses: Complete opacification left maxillary sinus with mild bony thickening suggesting chronic obstruction. Mucosal edema in the left sphenoid sinus. Right maxillary sinus clear. Ethmoid and sphenoid sinuses clear. Mastoid sinus is clear. Skeleton: Mild cervical spine degenerative change. No fracture or mass  lesion. Upper chest: Lung apices clear.  Mild apical scarring bilaterally. IMPRESSION: Palatine tonsils are prominent but symmetric bilaterally without mass lesion. Question chronic pharyngitis. Cervical adenopathy. Numerous prominent lymph nodes bilaterally. These could be reactive nodes versus lymphoma. Biopsy recommended if these do not clear with conservative treatment. Chronic obstruction left maxillary sinus 12 mm right thyroid nodule.  Thyroid ultrasound recommended. Electronically Signed   By: Franchot Gallo M.D.   On: 02/07/2016 07:11   Ct Chest W Contrast  02/14/2016  CLINICAL DATA:  Adenopathy. EXAM: CT CHEST, ABDOMEN, AND PELVIS WITH CONTRAST TECHNIQUE: Multidetector CT imaging of the chest, abdomen and pelvis was performed following the standard protocol during bolus administration of intravenous contrast. CONTRAST:  153mL ISOVUE-300 IOPAMIDOL (ISOVUE-300) INJECTION 61% COMPARISON:  CT chest from 11/02/2013 FINDINGS: CT CHEST FINDINGS Mediastinum/Lymph Nodes: The heart size appears normal. There is aortic atherosclerosis identified. The trachea appears patent and is midline. Normal appearance of the esophagus. Bilateral axillary adenopathy is identified. Index left axillary node measures 1.2 cm, image 15 of series 2. On the previous exam this measured 5 mm. Index right axillary node Measures 1 cm, image 16 of series 2. This is new from previous exam. Prominent bilateral supraclavicular lymph nodes are identified and new from previous exam. Index left supraclavicular node measures 1.2 cm, image 16 of series 2. Enlarged right hilar node is new from previous exam measuring 1 cm, image 36 of series 2. No enlarged mediastinal lymph nodes. Lungs/Pleura: There are multifocal, patchy areas of ground-glass attenuation within both lungs. The largest area is in the right lower lobe, image 88 of series 3. This is a new finding when compared with 11/02/2013. The previously noted pulmonary nodules identified on the  previous exam are no longer present. Musculoskeletal: No aggressive lytic or sclerotic bone lesion. CT ABDOMEN PELVIS FINDINGS Hepatobiliary: The patient has undergone interval cholecystectomy. Small focus of low attenuation in the gallbladder fossa remains measuring 1.5 cm, nonspecific. There is no intrahepatic or extrahepatic bile duct dilatation. Pancreas: No mass, inflammatory changes, or other significant abnormality. Spleen: Within normal limits in size and appearance. Adrenals/Urinary Tract: The adrenal glands are normal. Small low density structure within the right kidney measures 3 mm, image 67 of series 2. This is too small to characterize. The urinary bladder is unremarkable. Stomach/Bowel: Normal appearance of the stomach. The small bowel loops have a normal course and caliber. No bowel obstruction. Vascular/Lymphatic: Aortic atherosclerosis is identified. 9 mm periaortic lymph node is identified. Right pelvic sidewall lymph node measures 11 mm, image 99 of series 2. Left external iliac lymph node is identified measuring 1.2 cm, image 103 of series 2. Prominent bilateral inguinal nodes are identified. Index left inguinal lymph node measures 1 cm, image 113 of series 2. Reproductive: The uterus appears normal. Cyst within the left adnexa  measures 4.2 cm, image 97 of series 2. Other: No free fluid or fluid collections. Musculoskeletal:  No suspicious bone lesions identified. IMPRESSION: 1. Examination is positive for enlarged bilateral axillary, left supraclavicular and right hilar lymph nodes. There also borderline, pathologically enlarged pelvic and inguinal lymph nodes. Although nonspecific findings may reflect underline lymphoproliferative disorder, metastatic adenopathy or granulomatous inflammation or infection. Lymph nodes may be amendable to ultrasound-guided percutaneous biopsy. 2. There is nonspecific, multifocal areas of ground-glass attenuation identified within both lungs. Findings may reflect  an inflammatory or infectious process. Atypical infection is not excluded. No lobar consolidation or pulmonary nodularity identified. 3. Aortic atherosclerosis Electronically Signed   By: Kerby Moors M.D.   On: 02/14/2016 17:30   US Soft Tissue Head/neck  01/28/2016  CLINICAL DATA:  Right neck mass and right cervical lymphadenopathy. Symptoms for 6 weeks. EXAM: ULTRASOUND OF HEAD/NECK SOFT TISSUES TECHNIQUE: Ultrasound examination of the head and neck soft tissues was performed in the area of clinical concern. COMPARISON:  None. FINDINGS: There is an oval-shaped hypoechoic nodule at the area of concern in the right neck. This is suggestive for an enlarged lymph node measuring 1.5 x 0.6 x 1.4 cm. There are additional small lymph nodes surrounding the larger lymph node. There are multiple hypoechoic and slightly rounded lymph nodes in the right supraclavicular area. Index right supraclavicular lymph node measures 1.2 x 0.9 x 1.2 cm. Evidence for multiple lymph nodes throughout the left side of the neck. Prominent left supraclavicular lymph node measures 1.2 x 1.0 x 1.2 cm. IMPRESSION: Bilateral cervical lymphadenopathy. Findings raise concern for a neoplastic process or lymphoproliferative process. Consider further evaluation with a contrast-enhanced neck CT. These results will be called to the ordering clinician or representative by the Radiologist Assistant, and communication documented in the PACS or zVision Dashboard. Electronically Signed   By: Markus Daft M.D.   On: 01/28/2016 14:47   Ct Abdomen Pelvis W Contrast  02/14/2016  CLINICAL DATA:  Adenopathy. EXAM: CT CHEST, ABDOMEN, AND PELVIS WITH CONTRAST TECHNIQUE: Multidetector CT imaging of the chest, abdomen and pelvis was performed following the standard protocol during bolus administration of intravenous contrast. CONTRAST:  15mL ISOVUE-300 IOPAMIDOL (ISOVUE-300) INJECTION 61% COMPARISON:  CT chest from 11/02/2013 FINDINGS: CT CHEST FINDINGS  Mediastinum/Lymph Nodes: The heart size appears normal. There is aortic atherosclerosis identified. The trachea appears patent and is midline. Normal appearance of the esophagus. Bilateral axillary adenopathy is identified. Index left axillary node measures 1.2 cm, image 15 of series 2. On the previous exam this measured 5 mm. Index right axillary node Measures 1 cm, image 16 of series 2. This is new from previous exam. Prominent bilateral supraclavicular lymph nodes are identified and new from previous exam. Index left supraclavicular node measures 1.2 cm, image 16 of series 2. Enlarged right hilar node is new from previous exam measuring 1 cm, image 36 of series 2. No enlarged mediastinal lymph nodes. Lungs/Pleura: There are multifocal, patchy areas of ground-glass attenuation within both lungs. The largest area is in the right lower lobe, image 88 of series 3. This is a new finding when compared with 11/02/2013. The previously noted pulmonary nodules identified on the previous exam are no longer present. Musculoskeletal: No aggressive lytic or sclerotic bone lesion. CT ABDOMEN PELVIS FINDINGS Hepatobiliary: The patient has undergone interval cholecystectomy. Small focus of low attenuation in the gallbladder fossa remains measuring 1.5 cm, nonspecific. There is no intrahepatic or extrahepatic bile duct dilatation. Pancreas: No mass, inflammatory changes, or other significant  abnormality. Spleen: Within normal limits in size and appearance. Adrenals/Urinary Tract: The adrenal glands are normal. Small low density structure within the right kidney measures 3 mm, image 67 of series 2. This is too small to characterize. The urinary bladder is unremarkable. Stomach/Bowel: Normal appearance of the stomach. The small bowel loops have a normal course and caliber. No bowel obstruction. Vascular/Lymphatic: Aortic atherosclerosis is identified. 9 mm periaortic lymph node is identified. Right pelvic sidewall lymph node  measures 11 mm, image 99 of series 2. Left external iliac lymph node is identified measuring 1.2 cm, image 103 of series 2. Prominent bilateral inguinal nodes are identified. Index left inguinal lymph node measures 1 cm, image 113 of series 2. Reproductive: The uterus appears normal. Cyst within the left adnexa measures 4.2 cm, image 97 of series 2. Other: No free fluid or fluid collections. Musculoskeletal:  No suspicious bone lesions identified. IMPRESSION: 1. Examination is positive for enlarged bilateral axillary, left supraclavicular and right hilar lymph nodes. There also borderline, pathologically enlarged pelvic and inguinal lymph nodes. Although nonspecific findings may reflect underline lymphoproliferative disorder, metastatic adenopathy or granulomatous inflammation or infection. Lymph nodes may be amendable to ultrasound-guided percutaneous biopsy. 2. There is nonspecific, multifocal areas of ground-glass attenuation identified within both lungs. Findings may reflect an inflammatory or infectious process. Atypical infection is not excluded. No lobar consolidation or pulmonary nodularity identified. 3. Aortic atherosclerosis Electronically Signed   By: Kerby Moors M.D.   On: 02/14/2016 17:30     PATHOLOGY:    ASSESSMENT AND PLAN:  Lymphadenopathy Widespread lymphadenopathy with CT imaging on 02/14/2016 demonstrating bilateral axillar, left supraclavicular, and right hilar lymph nodes with borderline pathologically enlarged pelvic and inguinal lymph nodes.  I personally reviewed and went over laboratory results with the patient.  The results are noted within this dictation.  I personally reviewed and went over radiographic studies with the patient.  The results are noted within this dictation.    Refer to Gen Surg for excisional lymph node biopsy.  Return in 2-3 weeks for follow-up (~ 5 days post biopsy).  No labs today.      ORDERS PLACED FOR THIS ENCOUNTER: No orders of the  defined types were placed in this encounter.    MEDICATIONS PRESCRIBED THIS ENCOUNTER: No orders of the defined types were placed in this encounter.    THERAPY PLAN:  We'll refer the patient to Gen. surgery for excisional lymph node biopsy.  All questions were answered. The patient knows to call the clinic with any problems, questions or concerns. We can certainly see the patient much sooner if necessary.  Patient and plan discussed with Dr. Ancil Linsey and she is in agreement with the aforementioned.   This note is electronically signed by: Doy Mince 02/19/2016 10:39 AM

## 2016-02-19 NOTE — Patient Instructions (Signed)
[place] at [facility] Discharge Instructions  RECOMMENDATIONS MADE BY THE CONSULTANT AND ANY TEST RESULTS WILL BE SENT TO YOUR REFERRING PHYSICIAN.  You were seen by Darcella Gasman, PA. You will need to see Dr. Arnoldo Morale (general surgeon) for a biopsy.   Return to the clinic in 2 weeks for follow-up appointment. Call the clinic with any questions or concerns.   Thank you for choosing [place] at [facility] to provide your oncology and hematology care.  To afford each patient quality time with our provider, please arrive at least 15 minutes before your scheduled appointment time.   Beginning January 23rd 2017 lab work for the [facility] will be done in the  Main lab at [facility] on 1st floor. If you have a lab appointment with the [facility] please come in thru the  Main Entrance and check in at the main information desk  You need to re-schedule your appointment should you arrive 10 or more minutes late.  We strive to give you quality time with our providers, and arriving late affects you and other patients whose appointments are after yours.  Also, if you no show three or more times for appointments you may be dismissed from the clinic at the providers discretion.     Again, thank you for choosing [facility].  Our hope is that these requests will decrease the amount of time that you wait before being seen by our physicians.       _____________________________________________________________  Should you have questions after your visit to [facility], please contact our office at [phone] between the hours of 8:30 a.m. and 4:30 p.m.  Voicemails left after 4:30 p.m. will not be returned until the following business day.  For prescription refill requests, have your pharmacy contact our office.         Resources For Cancer Patients and their Caregivers ? American Cancer Society: Can assist with  transportation, wigs, general needs, runs Look Good Feel Better.        507-706-5973 ? Cancer Care: Provides financial assistance, online support groups, medication/co-pay assistance.  1-800-813-HOPE 724-704-9412) ? [place] Assists [place] cancer patients and their families through emotional , educational and financial support.  606-770-4012 ? [place] DSS Where to apply for food stamps, Medicaid and utility assistance. (615)036-0037 ? [facility]: Transportation to medical appointments. 9018252114 ? Social Security Administration: May apply for disability if have a Stage IV cancer. 4321020197 [phone] ? [employer], Disability and Transit Services: Assists with nutrition, care and transit needs. [place] Support Programs: @10RELATIVEDAYS @ > Cancer Support Group  2nd Tuesday of the month 1pm-2pm, Journey Room  > Creative Journey  3rd Tuesday of the month 1130am-1pm, Journey Room  > Look Good Feel Better  1st Wednesday of the month 10am-12 noon, Journey Room (Call [place] to register [phone])

## 2016-02-19 NOTE — Progress Notes (Signed)
Referral sent to Dr Arnoldo Morale  appt 6/13 patient aware

## 2016-02-19 NOTE — Assessment & Plan Note (Addendum)
Widespread lymphadenopathy with CT imaging on 02/14/2016 demonstrating bilateral axillar, left supraclavicular, and right hilar lymph nodes with borderline pathologically enlarged pelvic and inguinal lymph nodes.  I personally reviewed and went over laboratory results with the patient.  The results are noted within this dictation.  I personally reviewed and went over radiographic studies with the patient.  The results are noted within this dictation.    Refer to Gen Surg for excisional lymph node biopsy.  Return in 2-3 weeks for follow-up (~ 5 days post biopsy).  No labs today.

## 2016-03-03 ENCOUNTER — Other Ambulatory Visit: Payer: Self-pay | Admitting: Family Medicine

## 2016-03-03 NOTE — H&P (Signed)
  NTS SOAP Note  Vital Signs:  Vitals as of: [id number]: Systolic [id number]: Diastolic 97: Heart Rate 77: Temp 98.22F (Temporal): Height 21ft 4.5in: Weight 153Lbs 0 Ounces: BMI 25.86   BMI : 25.86 kg/m2  Subjective: This 55 year old female presents for of need for lymph node biopsy.  Has generalized lymphadenopathy of unknown etiology.  Referred by Dr. Whitney Muse of Oncology for lymph node biopsy.  Patient is a ward of the state, here with case worker.  Denies any specific pain, fever, chills, weight changes.  Some limited history obtained.  Review of Symptoms:  Constitutional:negative tremors, headache Eyes:negative Nose/Mouth/Throat:negative Cardiovascular:negative Respiratory:cough Gastrointestinabdominal pain Genitourinary:frequency joint, neck, and back pain Skin:negative as noted Allergic/Immunologic:negative   Past Medical History:Reviewed  Past Medical History  Surgical History: cholecystectomy, foot surgery Medical Problems: HTN Psychiatric History: psychosis, anxiety Allergies: nkda Medications: amlodipine, paxil   Social History:Reviewed  Social History  Preferred Language: English Race:  Black or African American Ethnicity: Not Hispanic / Latino Age: 34 year Marital Status:  S Alcohol: no   Smoking Status: Light tobacco smoker reviewed on 02/26/2016 Started Date:  Packs per week:  Functional Status reviewed on 02/26/2016 ------------------------------------------------ Bathing: Normal Cooking: Normal Dressing: Normal Driving: Normal Eating: Normal Managing Meds: Normal Oral Care: Normal Shopping: Normal Toileting: Normal Transferring: Normal Walking: Normal Cognitive Status reviewed on 02/26/2016 ------------------------------------------------ Attention: Normal Decision Making: Normal Language: Normal Memory: Normal Motor: Normal Perception: Normal Problem Solving: Normal Visual and Spatial: Normal   Family  History:Reviewed  Family Health History Mother, Living; Cancer unspecified;  Father     Objective Information: General:Well appearing, well nourished in no distress. Skin:no rash or prominent lesions Head:Atraumatic; no masses; no abnormalities Bilateral cervical lymphadenopathy.  Normal thyroid gland.  No carotid bruits present. Heart:RRR, no murmur or gallop.  Normal S1, S2.  No S3, S4.  Lungs:CTA bilaterally, no wheezes, rhonchi, rales.  Breathing unlabored. Abdomen:Soft, NT/ND, no HSM, no masses.  Bilateral inguinal lymphadenopathy. Bilateral large axillary lymph nodes palpable. Dr. Donald Pore note reviewed. Assessment:Generalized lymphadenopathy  Diagnoses: 785.6  R59.1 Generalized enlarged lymph nodes (Generalized enlarged lymph nodes)  Procedures: [id number] - OFFICE OUTPATIENT NEW 30 MINUTES    Plan:  Scheduled for right axillary lymph node biopsy oon 03/28/16.  Patient Education:Alternative treatments to surgery were discussed with patient (and family).Risks and benefits  of procedure were fully explained to the patient (and case worker who is POA) who gave informed consent. Patient/family questions were addressed.  Follow-up:Pending Surgery

## 2016-03-04 LAB — COMPREHENSIVE METABOLIC PANEL
ALK PHOS: 157 U/L — AB (ref 33–130)
ALT: 14 U/L (ref 6–29)
AST: 25 U/L (ref 10–35)
Albumin: 4 g/dL (ref 3.6–5.1)
BILIRUBIN TOTAL: 0.3 mg/dL (ref 0.2–1.2)
BUN: 8 mg/dL (ref 7–25)
CO2: 28 mmol/L (ref 20–31)
CREATININE: 0.61 mg/dL (ref 0.50–1.05)
Calcium: 9.6 mg/dL (ref 8.6–10.4)
Chloride: 104 mmol/L (ref 98–110)
Glucose, Bld: 84 mg/dL (ref 65–99)
POTASSIUM: 4 mmol/L (ref 3.5–5.3)
SODIUM: 142 mmol/L (ref 135–146)
TOTAL PROTEIN: 7.5 g/dL (ref 6.1–8.1)

## 2016-03-04 LAB — TSH: TSH: 1.13 mIU/L

## 2016-03-04 LAB — VITAMIN D 25 HYDROXY (VIT D DEFICIENCY, FRACTURES): Vit D, 25-Hydroxy: 15 ng/mL — ABNORMAL LOW (ref 30–100)

## 2016-03-07 MED ORDER — VITAMIN D (ERGOCALCIFEROL) 1.25 MG (50000 UNIT) PO CAPS: 50000.0000 [IU] | ORAL_CAPSULE | ORAL | Status: DC

## 2016-03-07 NOTE — Addendum Note (Signed)
Addended by: Denman [name] B on: 03/07/2016 11:30 AM   Modules accepted: Orders

## 2016-03-12 ENCOUNTER — Ambulatory Visit (HOSPITAL_COMMUNITY): Payer: Medicaid Other | Admitting: Hematology & Oncology

## 2016-03-17 ENCOUNTER — Other Ambulatory Visit: Payer: Self-pay | Admitting: Family Medicine

## 2016-03-19 NOTE — Patient Instructions (Addendum)
[name]  03/19/2016     @PREFPERIOPPHARMACY @   Your procedure is scheduled on 03/28/2016.  Report to [facility] at 7:30 A.M.  Call this number if you have problems the morning of surgery:  [phone]   Remember:  Do not eat food or drink liquids after midnight.  Take these medicines the morning of surgery with A SIP OF WATER : NORVASC AND PAXIL   Do not wear jewelry, make-up or nail polish.  Do not wear lotions, powders, or perfumes.  You may wear deoderant.  Do not shave 48 hours prior to surgery.  Men may shave face and neck.  Do not bring valuables to the hospital.  [facility] is not responsible for any belongings or valuables.  Contacts, dentures or bridgework may not be worn into surgery.  Leave your suitcase in the car.  After surgery it may be brought to your room.  For patients admitted to the hospital, discharge time will be determined by your treatment team.  Patients discharged the day of surgery will not be allowed to drive home.   Name and phone number of your driver:   FAMILY Special instructions:  N/A  Please read over the following fact sheets that you were given. Care and Recovery After Surgery      General Anesthesia, Adult General anesthesia is a sleep-like state of non-feeling produced by medicines (anesthetics). General anesthesia prevents you from being alert and feeling pain during a medical procedure. Your caregiver may recommend general anesthesia if your procedure:  Is long.  Is painful or uncomfortable.  Would be frightening to see or hear.  Requires you to be still.  Affects your breathing.  Causes significant blood loss. LET YOUR CAREGIVER KNOW ABOUT:  Allergies to food or medicine.  Medicines taken, including vitamins, herbs, eyedrops, over-the-counter medicines, and creams.  Use of steroids (by mouth or creams).  Previous problems with anesthetics or numbing medicines, including problems experienced by  relatives.  History of bleeding problems or blood clots.  Previous surgeries and types of anesthetics received.  Possibility of pregnancy, if this applies.  Use of cigarettes, alcohol, or illegal drugs.  Any health condition(s), especially diabetes, sleep apnea, and high blood pressure. RISKS AND COMPLICATIONS General anesthesia rarely causes complications. However, if complications do occur, they can be life threatening. Complications include:  A lung infection.  A stroke.  A heart attack.  Waking up during the procedure. When this occurs, the patient may be unable to move and communicate that he or she is awake. The patient may feel severe pain. Older adults and adults with serious medical problems are more likely to have complications than adults who are young and healthy. Some complications can be prevented by answering all of your caregiver's questions thoroughly and by following all pre-procedure instructions. It is important to tell your caregiver if any of the pre-procedure instructions, especially those related to diet, were not followed. Any food or liquid in the stomach can cause problems when you are under general anesthesia. BEFORE THE PROCEDURE  Ask your caregiver if you will have to spend the night at the hospital. If you will not have to spend the night, arrange to have an adult drive you and stay with you for 24 hours.  Follow your caregiver's instructions if you are taking dietary supplements or medicines. Your caregiver may tell you to stop taking them or to reduce your dosage.  Do not smoke for as long as possible before your procedure.  If possible, stop smoking 3-6 weeks before the procedure.  Do not take new dietary supplements or medicines within 1 week of your procedure unless your caregiver approves them.  Do not eat within 8 hours of your procedure or as directed by your caregiver. Drink only clear liquids, such as water, black coffee (without milk or cream),  and fruit juices (without pulp).  Do not drink within 3 hours of your procedure or as directed by your caregiver.  You may brush your teeth on the morning of the procedure, but make sure to spit out the toothpaste and water when finished. PROCEDURE  You will receive anesthetics through a mask, through an intravenous (IV) access tube, or through both. A doctor who specializes in anesthesia (anesthesiologist) or a nurse who specializes in anesthesia (nurse anesthetist) or both will stay with you throughout the procedure to make sure you remain unconscious. He or she will also watch your blood pressure, pulse, and oxygen levels to make sure that the anesthetics do not cause any problems. Once you are asleep, a breathing tube or mask may be used to help you breathe. AFTER THE PROCEDURE You will wake up after the procedure is complete. You may be in the room where the procedure was performed or in a recovery area. You may have a sore throat if a breathing tube was used. You may also feel:  Dizzy.  Weak.  Drowsy.  Confused.  Nauseous.  Cold. These are all normal responses and can be expected to last for up to 24 hours after the procedure is complete. A caregiver will tell you when you are ready to go home. This will usually be when you are fully awake and in stable condition.   This information is not intended to replace advice given to you by your health care provider. Make sure you discuss any questions you have with your health care provider.   Document Released: 12/09/2007 Document Revised: 09/22/2014 Document Reviewed: 12/31/2011 Elsevier Interactive Patient Education [employer].

## 2016-03-21 ENCOUNTER — Encounter (HOSPITAL_COMMUNITY)
Admission: RE | Admit: 2016-03-21 | Discharge: 2016-03-21 | Disposition: A | Discharge status: Home / Self Care | Payer: Medicaid Other | Source: Ambulatory Visit | Attending: General Surgery | Admitting: General Surgery

## 2016-03-21 ENCOUNTER — Encounter (HOSPITAL_COMMUNITY): Payer: Self-pay

## 2016-03-21 DIAGNOSIS — Z01810 Encounter for preprocedural cardiovascular examination: Secondary | ICD-10-CM | POA: Diagnosis not present

## 2016-03-21 DIAGNOSIS — Z01812 Encounter for preprocedural laboratory examination: Secondary | ICD-10-CM | POA: Insufficient documentation

## 2016-03-21 DIAGNOSIS — R591 Generalized enlarged lymph nodes: Secondary | ICD-10-CM | POA: Insufficient documentation

## 2016-03-21 LAB — CBC WITH DIFFERENTIAL/PLATELET
BASOS ABS: 0 10*3/uL (ref 0.0–0.1)
BASOS PCT: 0 %
EOS ABS: 0.1 10*3/uL (ref 0.0–0.7)
Eosinophils Relative: 1 %
HEMATOCRIT: 35.6 % — AB (ref 36.0–46.0)
HEMOGLOBIN: 11.8 g/dL — AB (ref 12.0–15.0)
Lymphocytes Relative: 45 %
Lymphs Abs: 3.2 10*3/uL (ref 0.7–4.0)
MCH: 29.6 pg (ref 26.0–34.0)
MCHC: 33.1 g/dL (ref 30.0–36.0)
MCV: 89.4 fL (ref 78.0–100.0)
MONOS PCT: 5 %
Monocytes Absolute: 0.4 10*3/uL (ref 0.1–1.0)
NEUTROS ABS: 3.5 10*3/uL (ref 1.7–7.7)
NEUTROS PCT: 49 %
Platelets: 239 10*3/uL (ref 150–400)
RBC: 3.98 MIL/uL (ref 3.87–5.11)
RDW: 15.6 % — ABNORMAL HIGH (ref 11.5–15.5)
WBC: 7.2 10*3/uL (ref 4.0–10.5)

## 2016-03-21 LAB — BASIC METABOLIC PANEL
ANION GAP: 6 (ref 5–15)
BUN: 12 mg/dL (ref 6–20)
CALCIUM: 9.3 mg/dL (ref 8.9–10.3)
CO2: 27 mmol/L (ref 22–32)
CREATININE: 0.58 mg/dL (ref 0.44–1.00)
Chloride: 106 mmol/L (ref 101–111)
Glucose, Bld: 87 mg/dL (ref 65–99)
Potassium: 3.3 mmol/L — ABNORMAL LOW (ref 3.5–5.1)
SODIUM: 139 mmol/L (ref 135–145)

## 2016-03-28 ENCOUNTER — Encounter (HOSPITAL_COMMUNITY)
Admission: RE | Disposition: A | Discharge status: Home / Self Care | Payer: Self-pay | Source: Ambulatory Visit | Attending: General Surgery

## 2016-03-28 ENCOUNTER — Ambulatory Visit (HOSPITAL_COMMUNITY): Payer: Medicaid Other | Admitting: Anesthesiology

## 2016-03-28 ENCOUNTER — Ambulatory Visit (HOSPITAL_COMMUNITY)
Admission: RE | Admit: 2016-03-28 | Discharge: 2016-03-28 | Disposition: A | Discharge status: Home / Self Care | Payer: Medicaid Other | Source: Ambulatory Visit | Attending: General Surgery | Admitting: General Surgery

## 2016-03-28 DIAGNOSIS — R590 Localized enlarged lymph nodes: Secondary | ICD-10-CM | POA: Diagnosis present

## 2016-03-28 DIAGNOSIS — F419 Anxiety disorder, unspecified: Secondary | ICD-10-CM | POA: Insufficient documentation

## 2016-03-28 DIAGNOSIS — C8594 Non-Hodgkin lymphoma, unspecified, lymph nodes of axilla and upper limb: Secondary | ICD-10-CM | POA: Insufficient documentation

## 2016-03-28 DIAGNOSIS — I10 Essential (primary) hypertension: Secondary | ICD-10-CM | POA: Insufficient documentation

## 2016-03-28 HISTORY — PX: AXILLARY LYMPH NODE BIOPSY: SHX5737

## 2016-03-28 SURGERY — AXILLARY LYMPH NODE BIOPSY
Anesthesia: General | Site: Axilla | Laterality: Right

## 2016-03-28 MED ORDER — HYDROCODONE-ACETAMINOPHEN 5-325 MG PO TABS: 1.0000 | ORAL_TABLET | Freq: Four times a day (QID) | ORAL | Status: DC | PRN

## 2016-03-28 MED ORDER — ONDANSETRON HCL 4 MG/2ML IJ SOLN
INTRAMUSCULAR | Status: AC
  Filled 2016-03-28: qty 2

## 2016-03-28 MED ORDER — BUPIVACAINE HCL 0.5 % IJ SOLN
INTRAMUSCULAR | Status: DC | PRN
  Administered 2016-03-28: 5 mL

## 2016-03-28 MED ORDER — LACTATED RINGERS IV SOLN
INTRAVENOUS | Status: DC
  Administered 2016-03-28: 1000 mL via INTRAVENOUS

## 2016-03-28 MED ORDER — KETOROLAC TROMETHAMINE 30 MG/ML IJ SOLN
30.0000 mg | Freq: Once | INTRAMUSCULAR | Status: AC
  Administered 2016-03-28: 30 mg via INTRAVENOUS
  Filled 2016-03-28: qty 1

## 2016-03-28 MED ORDER — CHLORHEXIDINE GLUCONATE CLOTH 2 % EX PADS: 6.0000 | MEDICATED_PAD | Freq: Once | CUTANEOUS | Status: DC

## 2016-03-28 MED ORDER — LIDOCAINE HCL 1 % IJ SOLN
INTRAMUSCULAR | Status: DC | PRN
  Administered 2016-03-28: 30 mg via INTRADERMAL

## 2016-03-28 MED ORDER — FENTANYL CITRATE (PF) 100 MCG/2ML IJ SOLN
25.0000 ug | INTRAMUSCULAR | Status: AC | PRN
  Administered 2016-03-28 (×2): 25 ug via INTRAVENOUS

## 2016-03-28 MED ORDER — FENTANYL CITRATE (PF) 100 MCG/2ML IJ SOLN
INTRAMUSCULAR | Status: AC
  Filled 2016-03-28: qty 2

## 2016-03-28 MED ORDER — FENTANYL CITRATE (PF) 100 MCG/2ML IJ SOLN: 25.0000 ug | INTRAMUSCULAR | Status: DC | PRN

## 2016-03-28 MED ORDER — MIDAZOLAM HCL 2 MG/2ML IJ SOLN
1.0000 mg | INTRAMUSCULAR | Status: DC | PRN
  Administered 2016-03-28: 2 mg via INTRAVENOUS

## 2016-03-28 MED ORDER — LIDOCAINE HCL (PF) 1 % IJ SOLN
INTRAMUSCULAR | Status: AC
Start: 2016-03-28 — End: 2016-03-28
  Filled 2016-03-28: qty 5

## 2016-03-28 MED ORDER — FENTANYL CITRATE (PF) 250 MCG/5ML IJ SOLN
INTRAMUSCULAR | Status: DC | PRN
  Administered 2016-03-28: 25 ug via INTRAVENOUS
  Administered 2016-03-28: 50 ug via INTRAVENOUS

## 2016-03-28 MED ORDER — BUPIVACAINE HCL (PF) 0.5 % IJ SOLN
INTRAMUSCULAR | Status: AC
  Filled 2016-03-28: qty 30

## 2016-03-28 MED ORDER — ONDANSETRON HCL 4 MG/2ML IJ SOLN
4.0000 mg | Freq: Once | INTRAMUSCULAR | Status: AC
  Administered 2016-03-28: 4 mg via INTRAVENOUS

## 2016-03-28 MED ORDER — 0.9 % SODIUM CHLORIDE (POUR BTL) OPTIME
TOPICAL | Status: DC | PRN
  Administered 2016-03-28: 1000 mL

## 2016-03-28 MED ORDER — FENTANYL CITRATE (PF) 250 MCG/5ML IJ SOLN
INTRAMUSCULAR | Status: AC
  Filled 2016-03-28: qty 5

## 2016-03-28 MED ORDER — PROPOFOL 10 MG/ML IV BOLUS
INTRAVENOUS | Status: AC
  Filled 2016-03-28: qty 20

## 2016-03-28 MED ORDER — ONDANSETRON HCL 4 MG/2ML IJ SOLN: 4.0000 mg | Freq: Once | INTRAMUSCULAR | Status: DC | PRN

## 2016-03-28 MED ORDER — MIDAZOLAM HCL 2 MG/2ML IJ SOLN
INTRAMUSCULAR | Status: AC
  Filled 2016-03-28: qty 2

## 2016-03-28 MED ORDER — PROPOFOL 10 MG/ML IV BOLUS
INTRAVENOUS | Status: DC | PRN
  Administered 2016-03-28: 130 mg via INTRAVENOUS

## 2016-03-28 SURGICAL SUPPLY — 48 items
ADH SKN CLS APL DERMABOND .7 (GAUZE/BANDAGES/DRESSINGS) ×1
APPLIER CLIP 11 MED OPEN (CLIP) ×3
APPLIER CLIP 9.375 SM OPEN (CLIP)
APR CLP MED 11 20 MLT OPN (CLIP) ×1
APR CLP SM 9.3 20 MLT OPN (CLIP)
BAG HAMPER (MISCELLANEOUS) ×3 IMPLANT
BLADE 15 SAFETY STRL DISP (BLADE) ×1 IMPLANT
BLADE SURG 15 STRL LF DISP TIS (BLADE) IMPLANT
BLADE SURG 15 STRL SS (BLADE) ×3
BNDG CMPR 82X61 PLY HI ABS (GAUZE/BANDAGES/DRESSINGS) ×1
BNDG CONFORM 6X.82 1P STRL (GAUZE/BANDAGES/DRESSINGS) ×3 IMPLANT
CLIP APPLIE 11 MED OPEN (CLIP) IMPLANT
CLIP APPLIE 9.375 SM OPEN (CLIP) IMPLANT
CLOSURE WOUND 1/2 X4 (GAUZE/BANDAGES/DRESSINGS) ×1
CLOTH BEACON ORANGE TIMEOUT ST (SAFETY) ×3 IMPLANT
CONT SPEC 4OZ CLIKSEAL STRL BL (MISCELLANEOUS) ×2 IMPLANT
COVER LIGHT HANDLE STERIS (MISCELLANEOUS) ×6 IMPLANT
DERMABOND ADVANCED (GAUZE/BANDAGES/DRESSINGS) ×2
DERMABOND ADVANCED .7 DNX12 (GAUZE/BANDAGES/DRESSINGS) IMPLANT
DURAPREP 26ML APPLICATOR (WOUND CARE) ×3 IMPLANT
ELECT REM PT RETURN 9FT ADLT (ELECTROSURGICAL) ×3
ELECTRODE REM PT RTRN 9FT ADLT (ELECTROSURGICAL) ×1 IMPLANT
EVACUATOR DRAINAGE 10X20 100CC (DRAIN) ×1 IMPLANT
EVACUATOR SILICONE 100CC (DRAIN) ×3
FORMALIN 10 PREFIL 480ML (MISCELLANEOUS) ×3 IMPLANT
GAUZE SPONGE 4X4 12PLY STRL (GAUZE/BANDAGES/DRESSINGS) ×3 IMPLANT
GLOVE BIOGEL PI IND STRL 7.0 (GLOVE) ×1 IMPLANT
GLOVE BIOGEL PI INDICATOR 7.0 (GLOVE) ×2
GLOVE ECLIPSE 6.5 STRL STRAW (GLOVE) ×4 IMPLANT
GLOVE EXAM NITRILE MD LF STRL (GLOVE) ×2 IMPLANT
GLOVE SURG SS PI 7.5 STRL IVOR (GLOVE) ×4 IMPLANT
GOWN STRL REUS W/TWL LRG LVL3 (GOWN DISPOSABLE) ×7 IMPLANT
INST SET MINOR GENERAL (KITS) ×3 IMPLANT
KIT ROOM TURNOVER APOR (KITS) ×3 IMPLANT
MANIFOLD NEPTUNE II (INSTRUMENTS) ×3 IMPLANT
NS IRRIG 1000ML POUR BTL (IV SOLUTION) ×3 IMPLANT
PACK MINOR (CUSTOM PROCEDURE TRAY) ×3 IMPLANT
PAD ARMBOARD 7.5X6 YLW CONV (MISCELLANEOUS) ×3 IMPLANT
SET BASIN LINEN APH (SET/KITS/TRAYS/PACK) ×3 IMPLANT
SPONGE INTESTINAL PEANUT (DISPOSABLE) ×1 IMPLANT
SPONGE LAP 18X18 X RAY DECT (DISPOSABLE) ×3 IMPLANT
STOCKINETTE IMPERVIOUS LG (DRAPES) ×1 IMPLANT
STRIP CLOSURE SKIN 1/2X4 (GAUZE/BANDAGES/DRESSINGS) ×2 IMPLANT
SUT ETHILON 3 0 FSL (SUTURE) ×1 IMPLANT
SUT VIC AB 3-0 SH 27 (SUTURE) ×3
SUT VIC AB 3-0 SH 27X BRD (SUTURE) ×1 IMPLANT
SUT VIC AB 4-0 PS2 27 (SUTURE) ×3 IMPLANT
SYR CONTROL 10ML LL (SYRINGE) ×2 IMPLANT

## 2016-03-28 NOTE — Anesthesia Preprocedure Evaluation (Signed)
Anesthesia Evaluation  Patient identified by MRN, date of birth, ID band Patient awake    Reviewed: Allergy & Precautions, NPO status , Patient's Chart, lab work & pertinent test results  Airway Mallampati: II  TM Distance: >3 FB     Dental  (+) Teeth Intact   Pulmonary Current Smoker,    breath sounds clear to auscultation       Cardiovascular hypertension, Pt. on medications  Rhythm:Regular Rate:Normal     Neuro/Psych PSYCHIATRIC DISORDERS Anxiety Depression    GI/Hepatic negative GI ROS,   Endo/Other    Renal/GU      Musculoskeletal   Abdominal   Peds  Hematology   Anesthesia Other Findings   Reproductive/Obstetrics                             Anesthesia Physical Anesthesia Plan  ASA: II  Anesthesia Plan: General   Post-op Pain Management:    Induction: Intravenous  Airway Management Planned: LMA  Additional Equipment:   Intra-op Plan:   Post-operative Plan: Extubation in OR  Informed Consent: I have reviewed the patients History and Physical, chart, labs and discussed the procedure including the risks, benefits and alternatives for the proposed anesthesia with the patient or authorized representative who has indicated his/her understanding and acceptance.     Plan Discussed with:   Anesthesia Plan Comments:         Anesthesia Quick Evaluation

## 2016-03-28 NOTE — Op Note (Signed)
Patient:  [name]  DOB:  Sep 27, 1960  MRN:  [id number]   Preop Diagnosis:  Right axillary lymphadenopathy  Postop Diagnosis:  Same  Procedure:  Right axillary lymph node biopsy  Surgeon:  Aviva Signs, M.D.  Anes:  Gen.  Indications:  Patient is a 55 year old black female who presents with nonspecific lymphadenopathy in the right axilla. She has been referred by oncology for right axillary lymph node biopsy. The risks and benefits of the procedure were fully explained to the patient, who gave informed consent.  Procedure note:  The patient was placed the supine position. After general anesthesia was administered, the right axilla was prepped and draped using the usual sterile technique with DuraPrep. Surgical site confirmation was performed.  An incision was made over the enlarged lymph node. The dissection was taken down to the lymph node. The lymph node measured almost 1/2 cm in its greatest diameter. Any small vessels were ligated using clips. The lymph node was removed and sent to pathology for flow cytometry. No abnormal bleeding was noted at the end of the procedure. 0.5% Sensorcaine was instilled the surrounding wound. The subcutaneous layer was reapproximated using 3-0 Vicryl interrupted suture. The skin was closed using a 4 Vicryl subcuticular suture. Dermabond was then applied.  All tape and needle counts were correct at the end of the procedure. The patient was awakened and transferred to PACU in stable condition.  Complications:  None  EBL:  Minimal  Specimen:  Right axillary lymph node

## 2016-03-28 NOTE — Transfer of Care (Signed)
Immediate Anesthesia Transfer of Care Note  Patient: [name]  Procedure(s) Performed: Procedure(s): RIGHT AXILLARY LYMPH NODE BIOPSY (Right)  Patient Location: PACU  Anesthesia Type:General  Level of Consciousness: awake  Airway & Oxygen Therapy: Patient Spontanous Breathing and Patient connected to face mask oxygen  Post-op Assessment: Report given to RN  Post vital signs: Reviewed and stable  Last Vitals:  Filed Vitals:   03/28/16 1140 03/28/16 1145  BP: 125/65 121/67  Pulse:    Temp:    Resp: 15 15    Last Pain: There were no vitals filed for this visit.       Complications: No apparent anesthesia complications

## 2016-03-28 NOTE — Anesthesia Postprocedure Evaluation (Signed)
Anesthesia Post Note  Patient: [name]  Procedure(s) Performed: Procedure(s) (LRB): RIGHT AXILLARY LYMPH NODE BIOPSY (Right)  Patient location during evaluation: Short Stay Anesthesia Type: General Level of consciousness: awake and alert Pain management: pain level controlled Vital Signs Assessment: post-procedure vital signs reviewed and stable Respiratory status: spontaneous breathing Cardiovascular status: blood pressure returned to baseline Postop Assessment: no signs of nausea or vomiting Anesthetic complications: no    Last Vitals:  Filed Vitals:   03/28/16 1315 03/28/16 1357  BP: 143/81 127/91  Pulse: 67 70  Temp:  36.8 C  Resp: 12 16    Last Pain: There were no vitals filed for this visit.               Jerelyn Trimarco

## 2016-03-28 NOTE — Discharge Instructions (Signed)

## 2016-03-28 NOTE — Anesthesia Procedure Notes (Signed)
Procedure Name: LMA Insertion Date/Time: 03/28/2016 11:58 AM Performed by: Tressie Stalker E Pre-anesthesia Checklist: Patient identified, Patient being monitored, Emergency Drugs available, Timeout performed and Suction available Patient Re-evaluated:Patient Re-evaluated prior to inductionOxygen Delivery Method: Circle System Utilized Preoxygenation: Pre-oxygenation with 100% oxygen Intubation Type: IV induction Ventilation: Mask ventilation without difficulty LMA: LMA inserted LMA Size: 4.0 Number of attempts: 1 Placement Confirmation: positive ETCO2 and breath sounds checked- equal and bilateral

## 2016-03-28 NOTE — Interval H&P Note (Signed)
History and Physical Interval Note:  03/28/2016 9:35 AM  [name]  has presented today for surgery, with the diagnosis of lymphadenopathy  The various methods of treatment have been discussed with the patient and family. After consideration of risks, benefits and other options for treatment, the patient has consented to  Procedure(s) with comments: AXILLARY LYMPH NODE BIOPSY (Right) - pt's social worker is aware of new arrival time 9:30 as a surgical intervention .  The patient's history has been reviewed, patient examined, no change in status, stable for surgery.  I have reviewed the patient's chart and labs.  Questions were answered to the patient's satisfaction.     Aviva Signs A

## 2016-04-02 NOTE — Progress Notes (Signed)
[name]  CONSULT NOTE  Patient Care Team: Fayrene Helper, MD as PCP - General Danie Binder, MD (Gastroenterology) Marnee Spring, MD as Referring Physician (Internal Medicine)  CHIEF COMPLAINTS/PURPOSE OF CONSULTATION:  CLL/SLL  HISTORY OF PRESENTING ILLNESS:  [name] 55 y.o. female is here because of lymphadenopathy. She has been referred by Dr. Moshe Cipro. She comes to the [facility] today for consult accompanied by her case worker, [name].  She has undergone an axillary biopsy. She is here to review results. She states that she feels well. She has no major complaints. She notes no pain at her biopsy site.   No night sweats. No fever or chills. Weight is stable.   MEDICAL HISTORY:  Past Medical History  Diagnosis Date  . Chronic abdominal pain     resolved  . Hypertension   . Psychosis   . Nicotine addiction   . Anxiety   . H. pylori infection 2/09    s/p prevpac  . Lymphadenopathy 01/17/2016    SURGICAL HISTORY: Past Surgical History  Procedure Laterality Date  . Surgery of left middle finger on left hand (childhood )    . Tubal ligation    . Cholecystectomy    . Colonoscopy  02/07/2011    SLF: Slightly tortuous colon.  Otherwise normal colon without evidence of polyps, masses, inflammatory changes, diverticular AVMs  . Hammer toe surgery Left   . Axillary lymph node biopsy Right 03/28/2016    Procedure: RIGHT AXILLARY LYMPH NODE BIOPSY;  Surgeon: Aviva Signs, MD;  Location: [facility];  Service: General;  Laterality: Right;    SOCIAL HISTORY: Social History   Social History  . Marital Status: Single    Spouse Name: N/A  . Number of Children: 2  . Years of Education: N/A   Occupational History  . disabled    Social History Main Topics  . Smoking status: Current Every Day Smoker -- 0.25 packs/day for 16 years    Types: Cigarettes  . Smokeless tobacco: Not on file     Comment: 5 per day   . Alcohol Use: No  . Drug Use: No  .  Sexual Activity: No   Other Topics Concern  . Not on file   Social History Narrative   Lives alone   Born here in [place]. She lives [address], [address] here in [place]. Lives by herself. Here today with her case worker, [name]. Smokes 8-9 cigarettes; is down to between 5-6 cigarettes. Been smoking since she was 17. Heaviest smoking was close to 12 a day, "some years back." She used to drink alcohol; notes that she drinks some beer every so often, not every day.  She enjoys walking, reading; goes to [employer] to check out books. Likes going to the clubhouse in [place], in [place].  FAMILY HISTORY: Family History  Problem Relation Age of Onset  . Cancer Mother    Mother deceased; died from cancer, doesn't know the kind. She was 72 when she died. Father is deceased as far as she knows; isn't sure. Sister and brother. Sister is 81, brother is 33. Knows that her sister smokes cigarettes and drinks too.  ALLERGIES:  has No Known Allergies.  MEDICATIONS:  Current Outpatient Prescriptions  Medication Sig Dispense Refill  . amLODipine (NORVASC) 10 MG tablet TAKE 1 TABLET BY MOUTH ONCE DAILY. 30 tablet 2  . clotrimazole-betamethasone (LOTRISONE) cream Apply twice daily t rashon breasts for 10 days (Patient  not taking: Reported on 02/19/2016) 45 g 1  . HYDROcodone-acetaminophen (NORCO/VICODIN) 5-325 MG tablet Take 1-2 tablets by mouth every 6 (six) hours as needed for moderate pain. 40 tablet 0  . PARoxetine (PAXIL) 20 MG tablet TAKE 1 TABLET BY MOUTH ONCE A DAY. 30 tablet 3  . terbinafine (LAMISIL) 250 MG tablet Take 1 tablet (250 mg total) by mouth daily. 30 tablet 3  . Vitamin D, Ergocalciferol, (DRISDOL) 50000 units CAPS capsule Take 1 capsule (50,000 Units total) by mouth every 7 (seven) days. 4 capsule 5  . [DISCONTINUED] fluticasone (FLONASE) 50 MCG/ACT nasal spray Place 1 spray into the nose daily. 16 g 4   No current facility-administered  medications for this visit.    Review of Systems  Constitutional: Negative.  Negative for fever, chills, weight loss and malaise/fatigue.  HENT: Negative.  Negative for congestion, hearing loss, nosebleeds, sore throat and tinnitus.   Eyes: Negative.  Negative for blurred vision, double vision, pain and discharge.  Respiratory: Negative.  Negative for cough, hemoptysis, sputum production, shortness of breath and wheezing.   Cardiovascular: Negative.  Negative for chest pain, palpitations, claudication, leg swelling and PND.  Gastrointestinal: Negative.  Negative for heartburn, nausea, vomiting, abdominal pain, diarrhea, constipation, blood in stool and melena.  Genitourinary: Negative.  Negative for dysuria, urgency, frequency and hematuria.  Musculoskeletal: Negative.  Negative for myalgias, joint pain and falls.  Skin: Negative.  Negative for itching and rash.  Neurological: Negative.  Negative for dizziness, tingling, tremors, sensory change, speech change, focal weakness, seizures, loss of consciousness, weakness and headaches.  Endo/Heme/Allergies: Negative.  Does not bruise/bleed easily.  Psychiatric/Behavioral: Negative for depression, suicidal ideas, memory loss and substance abuse. The patient is nervous/anxious. The patient does not have insomnia.   All other systems reviewed and are negative.  14 point ROS was done and is otherwise as detailed above or in HPI  PHYSICAL EXAMINATION: ECOG PERFORMANCE STATUS: 0 - Asymptomatic  Filed Vitals:   04/03/16 0900  BP: 118/69  Pulse: 79  Temp: 99.3 F (37.4 C)  Resp: 16   Filed Weights   04/03/16 0900  Weight: 157 lb 4.8 oz (71.351 kg)   Physical Exam  Constitutional: She is oriented to person, place, and time and well-developed, well-nourished, and in no distress.  HENT:  Head: Normocephalic and atraumatic.  Nose: Nose normal.  Mouth/Throat: Oropharynx is clear and moist. No oropharyngeal exudate.  Eyes: Conjunctivae and EOM  are normal. Pupils are equal, round, and reactive to light. Right eye exhibits no discharge. Left eye exhibits no discharge. No scleral icterus.  Neck: Normal range of motion. Neck supple. No tracheal deviation present. No thyromegaly present.  Cardiovascular: Normal rate, regular rhythm, normal heart sounds and intact distal pulses.  Exam reveals no gallop and no friction rub.   No murmur heard. Pulmonary/Chest: Effort normal and breath sounds normal. She has no wheezes. She has no rales.  R axillary site well healing, no swelling, no erythema  Abdominal: Soft. Bowel sounds are normal. She exhibits no distension and no mass. There is no tenderness. There is no rebound and no guarding.  Musculoskeletal: Normal range of motion. She exhibits no edema.  Lymphadenopathy:    She has cervical adenopathy.       Right cervical: Superficial cervical adenopathy present.       Left cervical: Superficial cervical adenopathy present.    She has axillary adenopathy.       Right: Inguinal adenopathy present.  Neurological: She is alert and  oriented to person, place, and time. She has normal reflexes. No cranial nerve deficit. Gait normal. Coordination normal.  Skin: Skin is warm and dry. No rash noted.  Psychiatric: Mood, memory, affect and judgment normal.  Nursing note and vitals reviewed.   LABORATORY DATA:  I have reviewed the data as listed Lab Results  Component Value Date   WBC 7.2 03/21/2016   HGB 11.8* 03/21/2016   HCT 35.6* 03/21/2016   MCV 89.4 03/21/2016   PLT 239 03/21/2016   CMP     Component Value Date/Time   NA 139 03/21/2016 0930   K 3.3* 03/21/2016 0930   CL 106 03/21/2016 0930   CO2 27 03/21/2016 0930   GLUCOSE 87 03/21/2016 0930   BUN 12 03/21/2016 0930   CREATININE 0.58 03/21/2016 0930   CREATININE 0.61 03/03/2016 0939   CALCIUM 9.3 03/21/2016 0930   PROT 7.5 03/03/2016 0939   ALBUMIN 4.0 03/03/2016 0939   AST 25 03/03/2016 0939   ALT 14 03/03/2016 0939   ALKPHOS  157* 03/03/2016 0939   BILITOT 0.3 03/03/2016 0939   GFRNONAA >60 03/21/2016 0930   GFRNONAA >89 11/29/2014 1021   GFRAA >60 03/21/2016 0930   GFRAA >89 11/29/2014 1021   PATHOLOGY:     RADIOGRAPHIC STUDIES: I have personally reviewed the radiological images as listed and agreed with the findings in the report. No results found.    Study Result     CLINICAL DATA: Adenopathy.  EXAM: CT CHEST, ABDOMEN, AND PELVIS WITH CONTRAST  TECHNIQUE: Multidetector CT imaging of the chest, abdomen and pelvis was performed following the standard protocol during bolus administration of intravenous contrast.  CONTRAST: 163m ISOVUE-300 IOPAMIDOL (ISOVUE-300) INJECTION 61%  COMPARISON: CT chest from 11/02/2013  FINDINGS: CT CHEST FINDINGS  Mediastinum/Lymph Nodes: The heart size appears normal. There is aortic atherosclerosis identified. The trachea appears patent and is midline. Normal appearance of the esophagus. Bilateral axillary adenopathy is identified. Index left axillary node measures 1.2 cm, image 15 of series 2. On the previous exam this measured 5 mm. Index right axillary node Measures 1 cm, image 16 of series 2. This is new from previous exam. Prominent bilateral supraclavicular lymph nodes are identified and new from previous exam. Index left supraclavicular node measures 1.2 cm, image 16 of series 2. Enlarged right hilar node is new from previous exam measuring 1 cm, image 36 of series 2. No enlarged mediastinal lymph nodes.  Lungs/Pleura: There are multifocal, patchy areas of ground-glass attenuation within both lungs. The largest area is in the right lower lobe, image 88 of series 3. This is a new finding when compared with 11/02/2013. The previously noted pulmonary nodules identified on the previous exam are no longer present.  Musculoskeletal: No aggressive lytic or sclerotic bone lesion.  CT ABDOMEN PELVIS FINDINGS  Hepatobiliary: The patient  has undergone interval cholecystectomy. Small focus of low attenuation in the gallbladder fossa remains measuring 1.5 cm, nonspecific. There is no intrahepatic or extrahepatic bile duct dilatation.  Pancreas: No mass, inflammatory changes, or other significant abnormality.  Spleen: Within normal limits in size and appearance.  Adrenals/Urinary Tract: The adrenal glands are normal. Small low density structure within the right kidney measures 3 mm, image 67 of series 2. This is too small to characterize. The urinary bladder is unremarkable.  Stomach/Bowel: Normal appearance of the stomach. The small bowel loops have a normal course and caliber. No bowel obstruction.  Vascular/Lymphatic: Aortic atherosclerosis is identified. 9 mm periaortic lymph node is identified. Right  pelvic sidewall lymph node measures 11 mm, image 99 of series 2. Left external iliac lymph node is identified measuring 1.2 cm, image 103 of series 2. Prominent bilateral inguinal nodes are identified. Index left inguinal lymph node measures 1 cm, image 113 of series 2.  Reproductive: The uterus appears normal. Cyst within the left adnexa measures 4.2 cm, image 97 of series 2.  Other: No free fluid or fluid collections.  Musculoskeletal: No suspicious bone lesions identified.  IMPRESSION: 1. Examination is positive for enlarged bilateral axillary, left supraclavicular and right hilar lymph nodes. There also borderline, pathologically enlarged pelvic and inguinal lymph nodes. Although nonspecific findings may reflect underline lymphoproliferative disorder, metastatic adenopathy or granulomatous inflammation or infection. Lymph nodes may be amendable to ultrasound-guided percutaneous biopsy. 2. There is nonspecific, multifocal areas of ground-glass attenuation identified within both lungs. Findings may reflect an inflammatory or infectious process. Atypical infection is not excluded. No lobar  consolidation or pulmonary nodularity identified. 3. Aortic atherosclerosis   Electronically Signed  By: Kerby Moors M.D.  On: 02/14/2016 17:30     ASSESSMENT & PLAN:  CLL/SLL Tobacco Use Binet Stage B   Pathology was reviewed in detail.   I discussed the following as detailed from up to date below:  Not all patients with CLL require treatment at the time of diagnosis. This is principally because: ?CLL is an extremely heterogeneous disease with certain subsets of patients having survival rates without treatment that are similar to the normal population  persons with asymptomatic CLL that displays poor-risk prognostic markers can have increased psychological stress that affects their emotional well-being and creates a push to start therapeutic interventions right away, there is no evidence that earlier treatment benefits the patient. Until studies demonstrate a benefit, early treatment of asymptomatic persons should be reserved for patients enrolled on these clinical trials. We do routinely perform FISH for del17p, del11q, trisomy 12, and del13q in asymptomatic patients at the time of diagnosis to predict time to progression. Treatment of the underlying CLL is indicated for patients who develop disease-related symptoms or evidence of progressive disease, termed "active disease." With  Therapy is indicated for patients with the following disease-related complications, usually termed "active disease" ?Weakness, night sweats, weight loss, fever, progressive increase in the size of lymph nodes, or pain associated with enlarged lymph nodes.  ?Symptomatic anemia and/or thrombocytopenia (Rai stages III or IV; Binet stage C) Autoimmune mediated anemia or thrombocytopenia is treated with therapy directed at the autoimmune process before treatment of the underlying CLL is initiated.  ?Autoimmune hemolytic anemia and/or thrombocytopenia inadequately responsive to corticosteroid therapy.    ?Progressive disease, as demonstrated by increasing lymphocytosis with a lymphocyte doubling time less than six months, and/or rapidly enlarging lymph nodes, spleen, and liver. In contrast, transient localized lymphadenopathy, occurring in response to localized infections, is not necessarily an indication for initiating treatment. ?Repeated episodes of infection. Hypogammaglobulinemia without repeated episodes of infection is not a clear indication for therapy. (Lymphocytosis itself, even if extreme, is not a strict indication for treatment if patients have no symptoms and adequate bone marrow function. However, in our experience, extreme lymphocytosis (leukocyte counts >200,000/microL) is associated with an increased risk of hyperviscosity syndrome and related complications   She has evidence of generalized LAD with obvious axillary, inguinal and supraclavicular and neck adenopathy on exam. Largest LN are axillary. She is asymptomatic.  Will send peripheral blood for flow cytometry.  RTC 8 weeks. She was provided with reading information today.  Her caregiver notes that  the patients son is her 88. Reading information will be given to him. He will likely come to her return appointment. All questions were answered. The patient knows to call the clinic with any problems, questions or concerns.  This document serves as a record of services personally performed by Ancil Linsey, MD. It was created on her behalf by Arlyce Harman, a trained medical scribe. The creation of this record is based on the scribe's personal observations and the provider's statements to them. This document has been checked and approved by the attending provider.  I have reviewed the above documentation for accuracy and completeness and I agree with the above.  This note was electronically signed.    Molli Hazard, MD  04/04/2016 5:09 PM

## 2016-04-03 ENCOUNTER — Encounter (HOSPITAL_COMMUNITY): Payer: Self-pay | Admitting: General Surgery

## 2016-04-03 ENCOUNTER — Encounter (HOSPITAL_COMMUNITY): Payer: Medicaid Other

## 2016-04-03 ENCOUNTER — Encounter (HOSPITAL_COMMUNITY): Payer: Medicaid Other | Attending: Hematology & Oncology | Admitting: Hematology & Oncology

## 2016-04-03 VITALS — BP 118/69 | HR 79 | Temp 99.3°F | Resp 16 | Wt 157.3 lb

## 2016-04-03 DIAGNOSIS — C9110 Chronic lymphocytic leukemia of B-cell type not having achieved remission: Secondary | ICD-10-CM | POA: Diagnosis not present

## 2016-04-03 DIAGNOSIS — Z720 Tobacco use: Secondary | ICD-10-CM

## 2016-04-03 DIAGNOSIS — R591 Generalized enlarged lymph nodes: Secondary | ICD-10-CM | POA: Diagnosis not present

## 2016-04-03 NOTE — Patient Instructions (Signed)
[place] at [facility] Discharge Instructions  RECOMMENDATIONS MADE BY THE CONSULTANT AND ANY TEST RESULTS WILL BE SENT TO YOUR REFERRING PHYSICIAN.  Exam done and seen today by Dr. Whitney Muse Labs done today, and in 8 weeks Return to see the doctor in 8 weeks Please call the clinic if you have any questions or concerns  Thank you for choosing [place] at [facility] to provide your oncology and hematology care.  To afford each patient quality time with our provider, please arrive at least 15 minutes before your scheduled appointment time.   Beginning January 23rd 2017 lab work for the [facility] will be done in the  Main lab at [facility] on 1st floor. If you have a lab appointment with the [facility] please come in thru the  Main Entrance and check in at the main information desk  You need to re-schedule your appointment should you arrive 10 or more minutes late.  We strive to give you quality time with our providers, and arriving late affects you and other patients whose appointments are after yours.  Also, if you no show three or more times for appointments you may be dismissed from the clinic at the providers discretion.     Again, thank you for choosing [facility]  Our hope is that these requests will decrease the amount of time that you wait before being seen by our physicians.       _____________________________________________________________  Should you have questions after your visit to [facility], please contact our office at [phone] between the hours of 8:30 a.m. and 4:30 p.m.  Voicemails left after 4:30 p.m. will not be returned until the following business day.  For prescription refill requests, have your pharmacy contact our office.         Resources For Cancer Patients and their Caregivers ? American Cancer Society: Can assist with transportation, wigs, general needs, runs Look  Good Feel Better.        701-206-7883 ? Cancer Care: Provides financial assistance, online support groups, medication/co-pay assistance.  1-800-813-HOPE 818-661-7620) ? [facility] Assists [place] cancer patients and their families through emotional , educational and financial support.  419-071-7894 ? [place] DSS Where to apply for food stamps, Medicaid and utility assistance. 463-159-3909 ? [facility]: Transportation to medical appointments. (715)866-0372 ? Social Security Administration: May apply for disability if have a Stage IV cancer. 279-003-8411 709-851-2989 ? [employer], Disability and Transit Services: Assists with nutrition, care and transit needs. [place] Support Programs: @10RELATIVEDAYS @ > Cancer Support Group  2nd Tuesday of the month 1pm-2pm, Journey Room  > Creative Journey  3rd Tuesday of the month 1130am-1pm, Journey Room  > Look Good Feel Better  1st Wednesday of the month 10am-12 noon, Journey Room (Call [facility] to register [phone])

## 2016-04-04 ENCOUNTER — Encounter (HOSPITAL_COMMUNITY): Payer: Self-pay | Admitting: Hematology & Oncology

## 2016-04-04 DIAGNOSIS — C9110 Chronic lymphocytic leukemia of B-cell type not having achieved remission: Secondary | ICD-10-CM | POA: Insufficient documentation

## 2016-04-04 HISTORY — DX: Chronic lymphocytic leukemia of B-cell type not having achieved remission: C91.10

## 2016-04-11 ENCOUNTER — Encounter: Payer: Medicaid Other | Admitting: Family Medicine

## 2016-04-15 ENCOUNTER — Other Ambulatory Visit: Payer: Self-pay | Admitting: Family Medicine

## 2016-04-21 ENCOUNTER — Telehealth: Payer: Self-pay

## 2016-04-21 NOTE — Telephone Encounter (Signed)
Permanent jury duty excuse letter composed and awaiting signature.  Will fax to [phone]

## 2016-05-01 ENCOUNTER — Other Ambulatory Visit (HOSPITAL_COMMUNITY)
Admission: RE | Admit: 2016-05-01 | Discharge: 2016-05-01 | Disposition: A | Discharge status: Home / Self Care | Payer: Medicaid Other | Source: Ambulatory Visit | Attending: Family Medicine | Admitting: Family Medicine

## 2016-05-01 ENCOUNTER — Ambulatory Visit (INDEPENDENT_AMBULATORY_CARE_PROVIDER_SITE_OTHER): Payer: Medicaid Other | Admitting: Family Medicine

## 2016-05-01 ENCOUNTER — Encounter: Payer: Self-pay | Admitting: Family Medicine

## 2016-05-01 VITALS — BP 120/80 | HR 87 | Resp 16 | Ht 65.0 in | Wt 157.0 lb

## 2016-05-01 DIAGNOSIS — Z0000 Encounter for general adult medical examination without abnormal findings: Secondary | ICD-10-CM | POA: Diagnosis not present

## 2016-05-01 DIAGNOSIS — Z124 Encounter for screening for malignant neoplasm of cervix: Secondary | ICD-10-CM

## 2016-05-01 DIAGNOSIS — Z01419 Encounter for gynecological examination (general) (routine) without abnormal findings: Secondary | ICD-10-CM | POA: Diagnosis present

## 2016-05-01 DIAGNOSIS — Z1211 Encounter for screening for malignant neoplasm of colon: Secondary | ICD-10-CM | POA: Diagnosis not present

## 2016-05-01 DIAGNOSIS — Z23 Encounter for immunization: Secondary | ICD-10-CM | POA: Diagnosis not present

## 2016-05-01 DIAGNOSIS — Z1151 Encounter for screening for human papillomavirus (HPV): Secondary | ICD-10-CM | POA: Diagnosis present

## 2016-05-01 DIAGNOSIS — F17200 Nicotine dependence, unspecified, uncomplicated: Secondary | ICD-10-CM | POA: Diagnosis not present

## 2016-05-01 LAB — POC HEMOCCULT BLD/STL (OFFICE/1-CARD/DIAGNOSTIC): Fecal Occult Blood, POC: NEGATIVE

## 2016-05-01 NOTE — Assessment & Plan Note (Signed)
Patient counseled for approximately 5 minutes regarding the health risks of ongoing nicotine use, specifically all types of cancer, heart disease, stroke and respiratory failure. The options available for help with cessation ,the behavioral changes to assist the process, and the option to either gradully reduce usage  Or abruptly stop.is also discussed. Pt is also encouraged to set specific goals in number of cigarettes used daily, as well as to set a quit date. Current is approx 7 per day

## 2016-05-01 NOTE — Assessment & Plan Note (Signed)

## 2016-05-01 NOTE — Patient Instructions (Addendum)
F/u in 4 month, call if you need me sooner  Please continue to take your medications and  Keep all appointments  Continue to work on stopping smoking, you NEED to [place]  Flu vaccine today  Thank you  for choosing [facility]. We consider it a privelige to serve you.  Delivering excellent health care in a caring and  compassionate way is our goal.  Partnering with you,  so that together we can achieve this goal is our strategy.

## 2016-05-03 NOTE — Progress Notes (Signed)
    [name]     MRN: [id number]      DOB: 02/25/1961  HPI: Patient is in for annual physical exam. No other health concerns are expressed by patient, her case worker questions whether she is compliant with antidepressant medication as reports she has been "acting flat" and not as engaged at club house as in recent past, patient does not provide any of this info, in fact she states the opposite spontaneously and is very talkative and engaged. Recent labs, if available are reviewed. Immunization is reviewed , and  updated if needed.   PE: Pleasant  female, alert and oriented x 3, in no cardio-pulmonary distress. Afebrile. HEENT No facial trauma or asymetry. Sinuses non tender.  Extra occullar muscles intact, pupils equally reactive to light. External ears normal, tympanic membranes clear. Oropharynx moist, no exudate. Neck: supple, no adenopathy,JVD or thyromegaly.No bruits.  Chest: Clear to ascultation bilaterally.No crackles or wheezes. Non tender to palpation  Breast: No asymetry,no masses or lumps. No tenderness. No nipple discharge or inversion. No axillary or supraclavicular adenopathy  Cardiovascular system; Heart sounds normal,  S1 and  S2 ,no S3.  No murmur, or thrill. Apical beat not displaced Peripheral pulses normal.  Abdomen: Soft, non tender, no organomegaly or masses. No bruits. Bowel sounds normal. No guarding, tenderness or rebound.  Rectal:  Normal sphincter tone. No rectal mass. Guaiac negative stool.  GU: External genitalia normal female genitalia , normal female distribution of hair. No lesions. Urethral meatus normal in size, no  Prolapse, no lesions visibly  Present. Bladder non tender. Vagina pink and moist , with no visible lesions , discharge present . Adequate pelvic support no  cystocele or rectocele noted Cervix pink and appears healthy, no lesions or ulcerations noted, no discharge noted from os Uterus normal size, no adnexal masses,  no cervical motion or adnexal tenderness.   Musculoskeletal exam: Full ROM of spine, hips , shoulders and knees. No deformity ,swelling or crepitus noted. No muscle wasting or atrophy.   Neurologic: Cranial nerves 2 to 12 intact. Power, tone ,sensation and reflexes normal throughout. No disturbance in gait. No tremor.  Skin: Intact, severe scaling on soles of feet and calluses, treated at foot clinic Pigmentation normal throughout  Psych; Normal mood and affect. Judgement and concentration normal   Assessment & Plan:  Annual physical exam Annual exam as documented. Counseling done  re healthy lifestyle involving commitment to 150 minutes exercise per week, heart healthy diet, and attaining healthy weight.The importance of adequate sleep also discussed. Regular seat belt use and home safety, is also discussed. Changes in health habits are decided on by the patient with goals and time frames  set for achieving them. Immunization and cancer screening needs are specifically addressed at this visit.   NICOTINE ADDICTION Patient counseled for approximately 5 minutes regarding the health risks of ongoing nicotine use, specifically all types of cancer, heart disease, stroke and respiratory failure. The options available for help with cessation ,the behavioral changes to assist the process, and the option to either gradully reduce usage  Or abruptly stop.is also discussed. Pt is also encouraged to set specific goals in number of cigarettes used daily, as well as to set a quit date. Current is approx 7 per day

## 2016-05-05 LAB — CYTOLOGY - PAP

## 2016-05-28 ENCOUNTER — Telehealth: Payer: Self-pay | Admitting: Family Medicine

## 2016-05-28 ENCOUNTER — Other Ambulatory Visit: Payer: Self-pay | Admitting: Family Medicine

## 2016-05-28 DIAGNOSIS — N938 Other specified abnormal uterine and vaginal bleeding: Secondary | ICD-10-CM

## 2016-05-28 MED ORDER — MEGESTROL ACETATE 40 MG PO TABS
ORAL_TABLET | ORAL | 1 refills | Status: DC
Start: 2016-05-28 — End: 2016-07-23

## 2016-05-28 NOTE — Telephone Encounter (Signed)
Message left for jennifer rider and told to call back with any questions

## 2016-05-28 NOTE — Telephone Encounter (Signed)
Referral has been made to [facility]

## 2016-05-28 NOTE — Telephone Encounter (Signed)
[name] is calling on behalf of [name], [name] is c/o her menstrual cycle lasting to long, yesterday was day 7 of her cycle with no signs of stopping, please advise?

## 2016-05-28 NOTE — Telephone Encounter (Signed)
[facility] sent in and referred to Dr Glo Herring, pls let them know

## 2016-05-29 ENCOUNTER — Ambulatory Visit (HOSPITAL_COMMUNITY): Payer: Medicaid Other | Admitting: Oncology

## 2016-06-06 ENCOUNTER — Encounter (HOSPITAL_BASED_OUTPATIENT_CLINIC_OR_DEPARTMENT_OTHER): Payer: Medicaid Other | Admitting: Hematology & Oncology

## 2016-06-06 ENCOUNTER — Ambulatory Visit (HOSPITAL_COMMUNITY): Payer: Medicaid Other | Admitting: Oncology

## 2016-06-06 ENCOUNTER — Encounter (HOSPITAL_COMMUNITY): Payer: Medicaid Other | Attending: Hematology & Oncology

## 2016-06-06 ENCOUNTER — Encounter (HOSPITAL_COMMUNITY): Payer: Self-pay | Admitting: Hematology & Oncology

## 2016-06-06 VITALS — BP 130/83 | HR 85 | Temp 99.7°F | Resp 16 | Ht 65.0 in | Wt 159.0 lb

## 2016-06-06 DIAGNOSIS — M7989 Other specified soft tissue disorders: Secondary | ICD-10-CM

## 2016-06-06 DIAGNOSIS — Z720 Tobacco use: Secondary | ICD-10-CM | POA: Diagnosis not present

## 2016-06-06 DIAGNOSIS — C9110 Chronic lymphocytic leukemia of B-cell type not having achieved remission: Secondary | ICD-10-CM | POA: Insufficient documentation

## 2016-06-06 DIAGNOSIS — R591 Generalized enlarged lymph nodes: Secondary | ICD-10-CM | POA: Diagnosis not present

## 2016-06-06 LAB — CBC WITH DIFFERENTIAL/PLATELET
BASOS ABS: 0 10*3/uL (ref 0.0–0.1)
BASOS PCT: 0 %
EOS ABS: 0.1 10*3/uL (ref 0.0–0.7)
EOS PCT: 2 %
HCT: 36.4 % (ref 36.0–46.0)
Hemoglobin: 12.3 g/dL (ref 12.0–15.0)
Lymphocytes Relative: 58 %
Lymphs Abs: 4.6 10*3/uL — ABNORMAL HIGH (ref 0.7–4.0)
MCH: 30.2 pg (ref 26.0–34.0)
MCHC: 33.8 g/dL (ref 30.0–36.0)
MCV: 89.4 fL (ref 78.0–100.0)
MONO ABS: 0.6 10*3/uL (ref 0.1–1.0)
Monocytes Relative: 7 %
Neutro Abs: 2.6 10*3/uL (ref 1.7–7.7)
Neutrophils Relative %: 33 %
PLATELETS: 252 10*3/uL (ref 150–400)
RBC: 4.07 MIL/uL (ref 3.87–5.11)
RDW: 16.1 % — AB (ref 11.5–15.5)
WBC: 8 10*3/uL (ref 4.0–10.5)

## 2016-06-06 NOTE — Patient Instructions (Addendum)
[place] at [facility] Discharge Instructions  RECOMMENDATIONS MADE BY THE CONSULTANT AND ANY TEST RESULTS WILL BE SENT TO YOUR REFERRING PHYSICIAN.  You were seen by Dr. Whitney Muse. Return in 3 months for labs.  CT of abdomen and pelvis in one week will call you with results.   Thank you for choosing [place] at [facility] to provide your oncology and hematology care.  To afford each patient quality time with our provider, please arrive at least 15 minutes before your scheduled appointment time.   Beginning January 23rd 2017 lab work for the [facility] will be done in the  Main lab at [facility] on 1st floor. If you have a lab appointment with the [place] please come in thru the  Main Entrance and check in at the main information desk  You need to re-schedule your appointment should you arrive 10 or more minutes late.  We strive to give you quality time with our providers, and arriving late affects you and other patients whose appointments are after yours.  Also, if you no show three or more times for appointments you may be dismissed from the clinic at the providers discretion.     Again, thank you for choosing [facility].  Our hope is that these requests will decrease the amount of time that you wait before being seen by our physicians.       _____________________________________________________________  Should you have questions after your visit to [facility], please contact our office at [phone] between the hours of 8:30 a.m. and 4:30 p.m.  Voicemails left after 4:30 p.m. will not be returned until the following business day.  For prescription refill requests, have your pharmacy contact our office.         Resources For Cancer Patients and their Caregivers ? American Cancer Society: Can assist with transportation, wigs, general needs, runs Look Good Feel Better.         ([phone] ? Cancer Care: Provides financial assistance, online support groups, medication/co-pay assistance.  1-800-813-HOPE 320-308-2142) ? Story Assists [place] cancer patients and their families through emotional , educational and financial support.  660-266-8633 ? [place] DSS Where to apply for food stamps, Medicaid and utility assistance. 201-188-5129 ? [employer]: Transportation to medical appointments. (204)691-2711 ? Social Security Administration: May apply for disability if have a Stage IV cancer. 780-014-5789 (760) 520-6880 ? [employer], Disability and Transit Services: Assists with nutrition, care and transit needs. [place] Support Programs: @10RELATIVEDAYS @ > Cancer Support Group  2nd Tuesday of the month 1pm-2pm, Journey Room  > Creative Journey  3rd Tuesday of the month 1130am-1pm, Journey Room  > Look Good Feel Better  1st Wednesday of the month 10am-12 noon, Journey Room (Call [place] to register [phone])

## 2016-06-06 NOTE — Progress Notes (Signed)
[place]  Progress Note  Patient Care Team: Fayrene Helper, MD as PCP - General Danie Binder, MD (Gastroenterology) Marnee Spring, MD as Referring Physician (Internal Medicine)  CHIEF COMPLAINTS/PURPOSE OF CONSULTATION:  CLL/SLL  HISTORY OF PRESENTING ILLNESS:  [name] 55 y.o. female is here for further follow-up of CLL/SLL. She principally has LAD, WBC count and other blood counts are maintained. She has no B symptoms. She has been referred by Dr. Moshe Cipro.   Ms. Maqueda is accompanied by her son and her caseworker, [name].  I personally reviewed and went over laboratory studies with the patient.Her son has multiple questions regarding SLL/CLL.   She has been staying active. She has been eating and sleeping well.  She continues to smoke cigarettes, admitting it is a bad habit. While she is at the [facility] [employer], 8 am to 4 pm Tuesday through Friday she smokes about 6 cigarettes. When she is at home, she smokes as many as she likes outside usually 2 to 3 but not more than 4. She does not smoke the same amount everyday, maybe 6 to 8 cigarettes daily.  Her caseworker is concerned about how shiny and tight the patient's skin is on her shins. Her legs have been swelling more recently.  Her caseworker notes the patient urinates frequently.  Weight is stable. She has not noticed any new lumps or bumps. She denies any bowel issues.  Last saw Dr. Moshe Cipro in August.   MEDICAL HISTORY:  Past Medical History:  Diagnosis Date  . Anxiety   . Chronic abdominal pain    resolved  . H. pylori infection 2/09   s/p prevpac  . Hypertension   . Lymphadenopathy 01/17/2016  . Nicotine addiction   . Psychosis     SURGICAL HISTORY: Past Surgical History:  Procedure Laterality Date  . AXILLARY LYMPH NODE BIOPSY Right 03/28/2016   Procedure: RIGHT AXILLARY LYMPH NODE BIOPSY;  Surgeon: Aviva Signs, MD;  Location: [facility];  Service: General;  Laterality: Right;  .  CHOLECYSTECTOMY    . COLONOSCOPY  02/07/2011   SLF: Slightly tortuous colon.  Otherwise normal colon without evidence of polyps, masses, inflammatory changes, diverticular AVMs  . HAMMER TOE SURGERY Left   . Surgery of left middle finger on left hand (childhood )    . TUBAL LIGATION      SOCIAL HISTORY: Social History   Social History  . Marital status: Single    Spouse name: N/A  . Number of children: 2  . Years of education: N/A   Occupational History  . disabled    Social History Main Topics  . Smoking status: Current Every Day Smoker    Packs/day: 0.25    Years: 16.00    Types: Cigarettes  . Smokeless tobacco: Never Used     Comment: 5 per day   . Alcohol use No  . Drug use: No  . Sexual activity: No   Other Topics Concern  . Not on file   Social History Narrative   Lives alone   Born here in [place]. She lives [address] here in [place]. Lives by herself. Here today with her case worker, [name]. Smokes 8-9 cigarettes; is down to between 5-6 cigarettes. Been smoking since she was 17. Heaviest smoking was close to 12 a day, "some years back." She used to drink alcohol; notes that she drinks some beer every so often, not every day.  She enjoys walking,  reading; goes to [employer] to check out books. Likes going to the clubhouse in [place], in [place].  FAMILY HISTORY: Family History  Problem Relation Age of Onset  . Cancer Mother    Mother deceased; died from cancer, doesn't know the kind. She was 42 when she died. Father is deceased as far as she knows; isn't sure. Sister and brother. Sister is 61, brother is 32. Knows that her sister smokes cigarettes and drinks too.  ALLERGIES:  has No Known Allergies.  MEDICATIONS:  Current Outpatient Prescriptions  Medication Sig Dispense Refill  . amLODipine (NORVASC) 10 MG tablet TAKE 1 TABLET BY MOUTH ONCE DAILY. 30 tablet 2  . megestrol (MEGACE) 40 MG tablet One  tablet three times daily x 1 week, when bleeding, then one tablet once daily for 1 week after bleeding stops 28 tablet 1  . Vitamin D, Ergocalciferol, (DRISDOL) 50000 units CAPS capsule Take 1 capsule (50,000 Units total) by mouth every 7 (seven) days. 4 capsule 5   No current facility-administered medications for this visit.     Review of Systems  Constitutional: Negative.  Negative for chills, fever, malaise/fatigue and weight loss.  HENT: Negative.  Negative for congestion, hearing loss, nosebleeds, sore throat and tinnitus.   Eyes: Negative.  Negative for blurred vision, double vision, pain and discharge.  Respiratory: Negative.  Negative for cough, hemoptysis, sputum production, shortness of breath and wheezing.   Cardiovascular: Negative.  Negative for chest pain, palpitations, claudication, leg swelling and PND.  Gastrointestinal: Negative.  Negative for abdominal pain, blood in stool, constipation, diarrhea, heartburn, melena, nausea and vomiting.  Genitourinary: Negative.  Negative for dysuria, frequency, hematuria and urgency.  Musculoskeletal: Negative.  Negative for falls, joint pain and myalgias.  Skin: Negative.  Negative for itching and rash.  Neurological: Negative.  Negative for dizziness, tingling, tremors, sensory change, speech change, focal weakness, seizures, loss of consciousness, weakness and headaches.  Endo/Heme/Allergies: Negative.  Does not bruise/bleed easily.  Psychiatric/Behavioral: Negative for depression, memory loss, substance abuse and suicidal ideas. The patient is nervous/anxious. The patient does not have insomnia.   All other systems reviewed and are negative. 14 point ROS was done and is otherwise as detailed above or in HPI   PHYSICAL EXAMINATION: ECOG PERFORMANCE STATUS: 0 - Asymptomatic  Vitals:   06/06/16 1445  BP: 130/83  Pulse: 85  Resp: 16  Temp: 99.7 F (37.6 C)   Filed Weights   06/06/16 1445  Weight: 159 lb (72.1 kg)   Physical  Exam  Constitutional: She is oriented to person, place, and time and well-developed, well-nourished, and in no distress.  HENT:  Head: Normocephalic and atraumatic.  Nose: Nose normal.  Mouth/Throat: Oropharynx is clear and moist. No oropharyngeal exudate.  Eyes: Conjunctivae and EOM are normal. Pupils are equal, round, and reactive to light. Right eye exhibits no discharge. Left eye exhibits no discharge. No scleral icterus.  Neck: Normal range of motion. Neck supple. No tracheal deviation present. No thyromegaly present.  Cardiovascular: Normal rate, regular rhythm, normal heart sounds and intact distal pulses.  Exam reveals no gallop and no friction rub.   No murmur heard. Pulmonary/Chest: Effort normal and breath sounds normal. She has no wheezes. She has no rales.  Abdominal: Soft. Bowel sounds are normal. She exhibits no distension and no mass. There is no tenderness. There is no rebound and no guarding.  Musculoskeletal: Normal range of motion. She exhibits edema.  Bilateral 3+ edema to the knee, she is wearing slippers today  Lymphadenopathy:    She has cervical adenopathy.       Right cervical: Superficial cervical adenopathy present.       Left cervical: Superficial cervical adenopathy present.    She has axillary adenopathy.       Right: Inguinal adenopathy present.  Neurological: She is alert and oriented to person, place, and time. She has normal reflexes. No cranial nerve deficit. Gait normal. Coordination normal.  Skin: Skin is warm and dry. No rash noted.  Psychiatric: Mood, memory, affect and judgment normal.  Nursing note and vitals reviewed.   LABORATORY DATA:  I have reviewed the data as listed Lab Results  Component Value Date   WBC 8.0 06/06/2016   HGB 12.3 06/06/2016   HCT 36.4 06/06/2016   MCV 89.4 06/06/2016   PLT 252 06/06/2016   CMP     Component Value Date/Time   NA 139 03/21/2016 0930   K 3.3 (L) 03/21/2016 0930   CL 106 03/21/2016 0930   CO2 27  03/21/2016 0930   GLUCOSE 87 03/21/2016 0930   BUN 12 03/21/2016 0930   CREATININE 0.58 03/21/2016 0930   CREATININE 0.61 03/03/2016 0939   CALCIUM 9.3 03/21/2016 0930   PROT 7.5 03/03/2016 0939   ALBUMIN 4.0 03/03/2016 0939   AST 25 03/03/2016 0939   ALT 14 03/03/2016 0939   ALKPHOS 157 (H) 03/03/2016 0939   BILITOT 0.3 03/03/2016 0939   GFRNONAA >60 03/21/2016 0930   GFRNONAA >89 11/29/2014 1021   GFRAA >60 03/21/2016 0930   GFRAA >89 11/29/2014 1021   PATHOLOGY:     RADIOGRAPHIC STUDIES: I have personally reviewed the radiological images as listed and agreed with the findings in the report. No results found.    Study Result     CLINICAL DATA: Adenopathy.  EXAM: CT CHEST, ABDOMEN, AND PELVIS WITH CONTRAST  TECHNIQUE: Multidetector CT imaging of the chest, abdomen and pelvis was performed following the standard protocol during bolus administration of intravenous contrast.  CONTRAST: 156m ISOVUE-300 IOPAMIDOL (ISOVUE-300) INJECTION 61%  COMPARISON: CT chest from 11/02/2013  FINDINGS: CT CHEST FINDINGS  Mediastinum/Lymph Nodes: The heart size appears normal. There is aortic atherosclerosis identified. The trachea appears patent and is midline. Normal appearance of the esophagus. Bilateral axillary adenopathy is identified. Index left axillary node measures 1.2 cm, image 15 of series 2. On the previous exam this measured 5 mm. Index right axillary node Measures 1 cm, image 16 of series 2. This is new from previous exam. Prominent bilateral supraclavicular lymph nodes are identified and new from previous exam. Index left supraclavicular node measures 1.2 cm, image 16 of series 2. Enlarged right hilar node is new from previous exam measuring 1 cm, image 36 of series 2. No enlarged mediastinal lymph nodes.  Lungs/Pleura: There are multifocal, patchy areas of ground-glass attenuation within both lungs. The largest area is in the right lower lobe,  image 88 of series 3. This is a new finding when compared with 11/02/2013. The previously noted pulmonary nodules identified on the previous exam are no longer present.  Musculoskeletal: No aggressive lytic or sclerotic bone lesion.  CT ABDOMEN PELVIS FINDINGS  Hepatobiliary: The patient has undergone interval cholecystectomy. Small focus of low attenuation in the gallbladder fossa remains measuring 1.5 cm, nonspecific. There is no intrahepatic or extrahepatic bile duct dilatation.  Pancreas: No mass, inflammatory changes, or other significant abnormality.  Spleen: Within normal limits in size and appearance.  Adrenals/Urinary Tract: The adrenal glands are normal. Small low density structure  within the right kidney measures 3 mm, image 67 of series 2. This is too small to characterize. The urinary bladder is unremarkable.  Stomach/Bowel: Normal appearance of the stomach. The small bowel loops have a normal course and caliber. No bowel obstruction.  Vascular/Lymphatic: Aortic atherosclerosis is identified. 9 mm periaortic lymph node is identified. Right pelvic sidewall lymph node measures 11 mm, image 99 of series 2. Left external iliac lymph node is identified measuring 1.2 cm, image 103 of series 2. Prominent bilateral inguinal nodes are identified. Index left inguinal lymph node measures 1 cm, image 113 of series 2.  Reproductive: The uterus appears normal. Cyst within the left adnexa measures 4.2 cm, image 97 of series 2.  Other: No free fluid or fluid collections.  Musculoskeletal: No suspicious bone lesions identified.  IMPRESSION: 1. Examination is positive for enlarged bilateral axillary, left supraclavicular and right hilar lymph nodes. There also borderline, pathologically enlarged pelvic and inguinal lymph nodes. Although nonspecific findings may reflect underline lymphoproliferative disorder, metastatic adenopathy or granulomatous inflammation  or infection. Lymph nodes may be amendable to ultrasound-guided percutaneous biopsy. 2. There is nonspecific, multifocal areas of ground-glass attenuation identified within both lungs. Findings may reflect an inflammatory or infectious process. Atypical infection is not excluded. No lobar consolidation or pulmonary nodularity identified. 3. Aortic atherosclerosis   Electronically Signed  By: Kerby Moors M.D.  On: 02/14/2016 17:30     ASSESSMENT & PLAN:  CLL/SLL Tobacco Use Binet Stage B Lower extremity swelling Tobacco Use HX psychosis  First I answered her son's questions regarding CLL/SLL. He was provided with multiple sources of reading information. Her CBC was reviewed and stable.   Leg swelling is new. She does have inquinal adenopathy which is stable on exam today. On CT she has pelvic adenopathy. I think this needs to be re-imaged and be sure she has not had progression. If she has, I would rebiopsy and rule out transformation, and hence need for therapy.  I reviewed all of this with her son, he has a good understanding.   CT imaging has been ordered. I will tentatively schedule her for follow up in 3 months. We will call her son with the results of CT scans. We will bring her back sooner, if needed.   I discussed the following as detailed from up to date below:  Not all patients with CLL require treatment at the time of diagnosis. This is principally because: ?CLL is an extremely heterogeneous disease with certain subsets of patients having survival rates without treatment that are similar to the normal population  persons with asymptomatic CLL that displays poor-risk prognostic markers can have increased psychological stress that affects their emotional well-being and creates a push to start therapeutic interventions right away, there is no evidence that earlier treatment benefits the patient. Until studies demonstrate a benefit, early treatment of asymptomatic  persons should be reserved for patients enrolled on these clinical trials. We do routinely perform FISH for del17p, del11q, trisomy 12, and del13q in asymptomatic patients at the time of diagnosis to predict time to progression. Treatment of the underlying CLL is indicated for patients who develop disease-related symptoms or evidence of progressive disease, termed "active disease." With  Therapy is indicated for patients with the following disease-related complications, usually termed "active disease" ?Weakness, night sweats, weight loss, fever, progressive increase in the size of lymph nodes, or pain associated with enlarged lymph nodes.  ?Symptomatic anemia and/or thrombocytopenia (Rai stages III or IV; Binet stage C) Autoimmune mediated anemia  or thrombocytopenia is treated with therapy directed at the autoimmune process before treatment of the underlying CLL is initiated.  ?Autoimmune hemolytic anemia and/or thrombocytopenia inadequately responsive to corticosteroid therapy.  ?Progressive disease, as demonstrated by increasing lymphocytosis with a lymphocyte doubling time less than six months, and/or rapidly enlarging lymph nodes, spleen, and liver. In contrast, transient localized lymphadenopathy, occurring in response to localized infections, is not necessarily an indication for initiating treatment. ?Repeated episodes of infection. Hypogammaglobulinemia without repeated episodes of infection is not a clear indication for therapy. (Lymphocytosis itself, even if extreme, is not a strict indication for treatment if patients have no symptoms and adequate bone marrow function. However, in our experience, extreme lymphocytosis (leukocyte counts >200,000/microL) is associated with an increased risk of hyperviscosity syndrome and related complications   All questions were answered. The patient knows to call the clinic with any problems, questions or concerns.  This document serves as a record of services  personally performed by Ancil Linsey, MD. It was created on her behalf by Arlyce Harman, a trained medical scribe. The creation of this record is based on the scribe's personal observations and the provider's statements to them. This document has been checked and approved by the attending provider.  I have reviewed the above documentation for accuracy and completeness and I agree with the above.  This note was electronically signed.    Molli Hazard, MD  06/15/2016 4:35 PM

## 2016-06-08 ENCOUNTER — Encounter (HOSPITAL_COMMUNITY): Payer: Self-pay | Admitting: Hematology & Oncology

## 2016-06-15 ENCOUNTER — Encounter (HOSPITAL_COMMUNITY): Payer: Self-pay | Admitting: Hematology & Oncology

## 2016-06-25 ENCOUNTER — Ambulatory Visit (HOSPITAL_COMMUNITY)
Admission: RE | Admit: 2016-06-25 | Discharge: 2016-06-25 | Disposition: A | Discharge status: Home / Self Care | Payer: Medicaid Other | Source: Ambulatory Visit | Attending: Hematology & Oncology | Admitting: Hematology & Oncology

## 2016-06-25 DIAGNOSIS — M7989 Other specified soft tissue disorders: Secondary | ICD-10-CM | POA: Diagnosis not present

## 2016-06-25 DIAGNOSIS — I700 Atherosclerosis of aorta: Secondary | ICD-10-CM | POA: Diagnosis not present

## 2016-06-25 DIAGNOSIS — C9110 Chronic lymphocytic leukemia of B-cell type not having achieved remission: Secondary | ICD-10-CM

## 2016-06-25 DIAGNOSIS — R935 Abnormal findings on diagnostic imaging of other abdominal regions, including retroperitoneum: Secondary | ICD-10-CM | POA: Diagnosis not present

## 2016-06-25 MED ORDER — IOPAMIDOL (ISOVUE-300) INJECTION 61%
100.0000 mL | Freq: Once | INTRAVENOUS | Status: AC | PRN
  Administered 2016-06-25: 100 mL via INTRAVENOUS

## 2016-06-27 ENCOUNTER — Encounter: Payer: Self-pay | Admitting: Obstetrics & Gynecology

## 2016-06-27 ENCOUNTER — Ambulatory Visit (INDEPENDENT_AMBULATORY_CARE_PROVIDER_SITE_OTHER): Payer: Medicaid Other | Admitting: Obstetrics & Gynecology

## 2016-06-27 ENCOUNTER — Other Ambulatory Visit (INDEPENDENT_AMBULATORY_CARE_PROVIDER_SITE_OTHER): Payer: Medicaid Other

## 2016-06-27 VITALS — BP 130/90 | HR 66 | Ht 65.0 in | Wt 159.0 lb

## 2016-06-27 DIAGNOSIS — N83202 Unspecified ovarian cyst, left side: Secondary | ICD-10-CM | POA: Diagnosis not present

## 2016-06-27 DIAGNOSIS — N854 Malposition of uterus: Secondary | ICD-10-CM | POA: Diagnosis not present

## 2016-06-27 DIAGNOSIS — N950 Postmenopausal bleeding: Secondary | ICD-10-CM | POA: Diagnosis not present

## 2016-06-27 NOTE — Progress Notes (Signed)
PELVIC US TA ( T/A only per pt) homogeneous anteverted uterus,wnl,thickened EEC 7.1 mm,normal right ov,simple left ov cyst 3.7 x 3.3 x 3.4 cm,no free fluid seen

## 2016-06-27 NOTE — Progress Notes (Signed)
Subjective:    [name] is a 55 y.o., post-menopausal female who presents for concerns regarding vaginal bleeding. She has been menopausal for 2 years. Currently on no HRT and has been on this regimen for na . Bleeding is described as variable, period like then spotting and has occurred 1 times. Other menopausal symptoms include: none. Workup to date: none.  Menstrual History: OB History    No data available      Menarche age: 89 Patient's last menstrual period was 05/16/2016.    The following portions of the patient's history were reviewed and updated as appropriate: allergies, current medications, past family history, past medical history, past social history, past surgical history and problem list.  Past Medical History:  Diagnosis Date  . Anxiety   . Chronic abdominal pain    resolved  . H. pylori infection 2/09   s/p prevpac  . Hypertension   . Lymphadenopathy 01/17/2016  . Nicotine addiction   . Psychosis     Past Surgical History:  Procedure Laterality Date  . AXILLARY LYMPH NODE BIOPSY Right 03/28/2016   Procedure: RIGHT AXILLARY LYMPH NODE BIOPSY;  Surgeon: Aviva Signs, MD;  Location: [facility];  Service: General;  Laterality: Right;  . CHOLECYSTECTOMY    . COLONOSCOPY  02/07/2011   SLF: Slightly tortuous colon.  Otherwise normal colon without evidence of polyps, masses, inflammatory changes, diverticular AVMs  . HAMMER TOE SURGERY Left   . Surgery of left middle finger on left hand (childhood )    . TUBAL LIGATION      OB History    No data available      No Known Allergies  Social History   Social History  . Marital status: Single    Spouse name: N/A  . Number of children: 2  . Years of education: N/A   Occupational History  . disabled    Social History Main Topics  . Smoking status: Current Every Day Smoker    Packs/day: 0.25    Years: 16.00    Types: Cigarettes  . Smokeless tobacco: Never Used     Comment: 5 per day   . Alcohol use No  .  Drug use: No  . Sexual activity: No   Other Topics Concern  . None   Social History Narrative   Lives alone    Family History  Problem Relation Age of Onset  . Cancer Mother      Review of Systems Pertinent items are noted in HPI.    Objective:    BP 130/90   Pulse 66   Ht 5\' 5"  (1.651 m)   Wt 159 lb (72.1 kg)   LMP 05/16/2016   BMI 26.46 kg/m  BP 130/90   Pulse 66   Ht 5\' 5"  (1.651 m)   Wt 159 lb (72.1 kg)   LMP 05/16/2016   BMI 26.46 kg/m   Study Result   GYNECOLOGIC SONOGRAM   [name] is a 55 y.o.LMP 05/18/2016, she is here for a pelvic sonogram for postmenopausal bleeding.  Uterus                      7.3 x 3.5 x 5.6 cm, homogeneous anteverted uterus, wnl  Endometrium          7.1 mm, symmetrical, thickened   Right ovary             1.5 x 1.7 x 1.1 cm, wnl  Left ovary  4.4 x 4 x 4 cm, simple left ov cyst 3.7 x 3.3 x 3.4 cm  No free fluid seen.  Technician Comments:  PELVIC US TA ( T/A only per pt) homogeneous anteverted uterus,wnl,thickened EEC 7.1 mm,normal right ov,simple left ov cyst 3.7 x 3.3 x 3.4 cm,no free fluid seen    [employer] 06/27/2016 11:19 AM  Clinical Impression and recommendations:  I have reviewed the sonogram results above, combined with the patient's current clinical course, below are my impressions and any appropriate recommendations for management based on the sonographic findings.  Thickened endometrium in a post menopausal woman Normal uterus Right ovary normal Simple cyst left ovary, recommend re evaluate in 6 months   Qusay Villada H     General Appearance:    Alert, cooperative, no distress, appears stated age             General WDWN female NAD Vulva:  normal appearing vulva with no masses, tenderness or lesions Vagina:  normal mucosa, scant blood Cervix:  no cervical motion tenderness and no lesions Uterus:  normal size, contour, position, consistency, mobility,  non-tender Adnexa: ovaries:present,  normal adnexa in size, nontender and no masses                .res                                     Assessment:    Postmenopausal bleeding   Plan:  Pt will not tolerate EMB in office will need to do in or, to be scheduled

## 2016-07-02 ENCOUNTER — Telehealth (HOSPITAL_COMMUNITY): Payer: Self-pay | Admitting: Emergency Medicine

## 2016-07-02 NOTE — Telephone Encounter (Signed)
Called [name] with pts CBC results

## 2016-07-02 NOTE — Telephone Encounter (Signed)
Tried to call [name] rider with pts lab results

## 2016-07-15 ENCOUNTER — Telehealth: Payer: Self-pay | Admitting: Obstetrics & Gynecology

## 2016-07-15 NOTE — Telephone Encounter (Signed)
Spoke with [name] from the [place] states will need to reschedule pt appt for surgery with Dr.Eure, call transferred to [facility] to r/s surgery. [name] also requesting that she be contacted instead of pt for appt/results, etc. [name] from the [place] states pt gets very anxious and nervous and would prefer for her to be office contact.

## 2016-07-20 ENCOUNTER — Other Ambulatory Visit (HOSPITAL_COMMUNITY): Payer: Self-pay | Admitting: Hematology & Oncology

## 2016-07-20 DIAGNOSIS — N83202 Unspecified ovarian cyst, left side: Secondary | ICD-10-CM

## 2016-07-21 NOTE — Patient Instructions (Signed)
[name]  07/21/2016     @PREFPERIOPPHARMACY @   Your procedure is scheduled on 07/30/2016.  Report to [facility] at 8:00 A.M.  Call this number if you have problems the morning of surgery:  [phone]   Remember:  Do not eat food or drink liquids after midnight.  Take these medicines the morning of surgery with A SIP OF WATER : NORVASC AND PAXIL   Do not wear jewelry, make-up or nail polish.  Do not wear lotions, powders, or perfumes, or deoderant.  Do not shave 48 hours prior to surgery.  Men may shave face and neck.  Do not bring valuables to the hospital.  [facility] is not responsible for any belongings or valuables.  Contacts, dentures or bridgework may not be worn into surgery.  Leave your suitcase in the car.  After surgery it may be brought to your room.  For patients admitted to the hospital, discharge time will be determined by your treatment team.  Patients discharged the day of surgery will not be allowed to drive home.   Name and phone number of your driver:   FAMILY Special instructions:  N/A  Please read over the following fact sheets that you were given. Care and Recovery After Surgery    Dilation and Curettage or Vacuum Curettage, Care After These instructions give you information on caring for yourself after your procedure. Your doctor may also give you more specific instructions. Call your doctor if you have any problems or questions after your procedure. HOME CARE  Do not drive for 24 hours.  Wait 1 week before doing any activities that wear you out.  Take your temperature 2 times a day for 4 days. Write it down. Tell your doctor if you have a fever.  Do not stand for a long time.  Do not lift, push, or pull anything over 10 pounds (4.5 kilograms).  Limit stair climbing to once or twice a day.  Rest often.  Continue with your usual diet.  Drink enough fluids to keep your pee (urine) clear or pale yellow.  If you have a hard time  pooping (constipation), you may:  Take a medicine to help you go poop (laxative) as told by your doctor.  Eat more fruit and bran.  Drink more fluids.  Take showers, not baths, for as long as told by your doctor.  Do not swim or use a hot tub until your doctor says it is okay.  Have someone with you for 1-2 days after the procedure.  Do not douche, use tampons, or have sex (intercourse) for 2 weeks.  Only take medicines as told by your doctor. Do not take aspirin. It can cause bleeding.  Keep all doctor visits. GET HELP IF:  You have cramps or pain not helped by medicine.  You have new pain in the belly (abdomen).  You have a bad smelling fluid coming from your vagina.  You have a rash.  You have problems with any medicine. GET HELP RIGHT AWAY IF:   You start to bleed more than a regular period.  You have a fever.  You have chest pain.  You have trouble breathing.  You feel dizzy or feel like passing out (fainting).  You pass out.  You have pain in the tops of your shoulders.  You have vaginal bleeding with or without clumps of blood (blood clots). MAKE SURE YOU:  Understand these instructions.  Will watch your condition.  Will get help  right away if you are not doing well or get worse.   This information is not intended to replace advice given to you by your health care provider. Make sure you discuss any questions you have with your health care provider.   Document Released: 06/10/2008 Document Revised: 09/06/2013 Document Reviewed: 03/31/2013 Elsevier Interactive Patient Education [employer]. Hysteroscopy Hysteroscopy is a procedure used for looking inside the womb (uterus). It may be done for various reasons, including:  To evaluate abnormal bleeding, fibroid (benign, noncancerous) tumors, polyps, scar tissue (adhesions), and possibly cancer of the uterus.  To look for lumps (tumors) and other uterine growths.  To look for causes of why a  woman cannot get pregnant (infertility), causes of recurrent loss of pregnancy (miscarriages), or a lost intrauterine device (IUD).  To perform a sterilization by blocking the fallopian tubes from inside the uterus. In this procedure, a thin, flexible tube with a tiny light and camera on the end of it (hysteroscope) is used to look inside the uterus. A hysteroscopy should be done right after a menstrual period to be sure you are not pregnant. LET [facility] CARE PROVIDER KNOW ABOUT:   Any allergies you have.  All medicines you are taking, including vitamins, herbs, eye drops, creams, and over-the-counter medicines.  Previous problems you or members of your family have had with the use of anesthetics.  Any blood disorders you have.  Previous surgeries you have had.  Medical conditions you have. RISKS AND COMPLICATIONS  Generally, this is a safe procedure. However, as with any procedure, complications can occur. Possible complications include:  Putting a hole in the uterus.  Excessive bleeding.  Infection.  Damage to the cervix.  Injury to other organs.  Allergic reaction to medicines.  Too much fluid used in the uterus for the procedure. BEFORE THE PROCEDURE   Ask your health care provider about changing or stopping any regular medicines.  Do not take aspirin or blood thinners for 1 week before the procedure, or as directed by your health care provider. These can cause bleeding.  If you smoke, do not smoke for 2 weeks before the procedure.  In some cases, a medicine is placed in the cervix the day before the procedure. This medicine makes the cervix have a larger opening (dilate). This makes it easier for the instrument to be inserted into the uterus during the procedure.  Do not eat or drink anything for at least 8 hours before the surgery.  Arrange for someone to take you home after the procedure. PROCEDURE   You may be given a medicine to relax you (sedative). You  may also be given one of the following:  A medicine that numbs the area around the cervix (local anesthetic).  A medicine that makes you sleep through the procedure (general anesthetic).  The hysteroscope is inserted through the vagina into the uterus. The camera on the hysteroscope sends a picture to a TV screen. This gives the surgeon a good view inside the uterus.  During the procedure, air or a liquid is put into the uterus, which allows the surgeon to see better.  Sometimes, tissue is gently scraped from inside the uterus. These tissue samples are sent to a lab for testing. AFTER THE PROCEDURE   If you had a general anesthetic, you may be groggy for a couple hours after the procedure.  If you had a local anesthetic, you will be able to go home as soon as you are stable and  feel ready.  You may have some cramping. This normally lasts for a couple days.  You may have bleeding, which varies from light spotting for a few days to menstrual-like bleeding for 3-7 days. This is normal.  If your test results are not back during the visit, make an appointment with your health care provider to find out the results.   This information is not intended to replace advice given to you by your health care provider. Make sure you discuss any questions you have with your health care provider.   Document Released: 12/08/2000 Document Revised: 06/22/2013 Document Reviewed: 03/31/2013 Elsevier Interactive Patient Education [employer].

## 2016-07-22 ENCOUNTER — Other Ambulatory Visit: Payer: Self-pay | Admitting: Obstetrics & Gynecology

## 2016-07-23 ENCOUNTER — Encounter (HOSPITAL_COMMUNITY): Payer: Self-pay

## 2016-07-23 ENCOUNTER — Encounter (HOSPITAL_COMMUNITY)
Admission: RE | Admit: 2016-07-23 | Discharge: 2016-07-23 | Disposition: A | Discharge status: Home / Self Care | Payer: Medicaid Other | Source: Ambulatory Visit | Attending: Obstetrics & Gynecology | Admitting: Obstetrics & Gynecology

## 2016-07-23 ENCOUNTER — Encounter: Payer: Self-pay | Admitting: Family Medicine

## 2016-07-23 ENCOUNTER — Ambulatory Visit (INDEPENDENT_AMBULATORY_CARE_PROVIDER_SITE_OTHER): Payer: Medicaid Other | Admitting: Family Medicine

## 2016-07-23 VITALS — BP 122/78 | HR 82 | Resp 16 | Ht 65.0 in | Wt 158.0 lb

## 2016-07-23 DIAGNOSIS — Z01818 Encounter for other preprocedural examination: Secondary | ICD-10-CM | POA: Diagnosis not present

## 2016-07-23 DIAGNOSIS — F17200 Nicotine dependence, unspecified, uncomplicated: Secondary | ICD-10-CM

## 2016-07-23 DIAGNOSIS — F17218 Nicotine dependence, cigarettes, with other nicotine-induced disorders: Secondary | ICD-10-CM | POA: Diagnosis not present

## 2016-07-23 DIAGNOSIS — R600 Localized edema: Secondary | ICD-10-CM

## 2016-07-23 DIAGNOSIS — I739 Peripheral vascular disease, unspecified: Secondary | ICD-10-CM

## 2016-07-23 DIAGNOSIS — F4323 Adjustment disorder with mixed anxiety and depressed mood: Secondary | ICD-10-CM

## 2016-07-23 DIAGNOSIS — I10 Essential (primary) hypertension: Secondary | ICD-10-CM

## 2016-07-23 LAB — COMPREHENSIVE METABOLIC PANEL
ALBUMIN: 3.8 g/dL (ref 3.5–5.0)
ALK PHOS: 134 U/L — AB (ref 38–126)
ALT: 16 U/L (ref 14–54)
AST: 27 U/L (ref 15–41)
Anion gap: 7 (ref 5–15)
BUN: 10 mg/dL (ref 6–20)
CALCIUM: 9.3 mg/dL (ref 8.9–10.3)
CO2: 27 mmol/L (ref 22–32)
CREATININE: 0.64 mg/dL (ref 0.44–1.00)
Chloride: 103 mmol/L (ref 101–111)
GFR calc Af Amer: >60 mL/min (ref >60)
GFR calc non Af Amer: >60 mL/min (ref >60)
GLUCOSE: 121 mg/dL — AB (ref 65–99)
Potassium: 3.1 mmol/L — ABNORMAL LOW (ref 3.5–5.1)
SODIUM: 137 mmol/L (ref 135–145)
Total Bilirubin: 0.5 mg/dL (ref 0.3–1.2)
Total Protein: 7.6 g/dL (ref 6.5–8.1)

## 2016-07-23 LAB — URINE MICROSCOPIC-ADD ON

## 2016-07-23 LAB — CBC
HEMATOCRIT: 36.2 % (ref 36.0–46.0)
HEMOGLOBIN: 12.2 g/dL (ref 12.0–15.0)
MCH: 30.5 pg (ref 26.0–34.0)
MCHC: 33.7 g/dL (ref 30.0–36.0)
MCV: 90.5 fL (ref 78.0–100.0)
Platelets: 303 10*3/uL (ref 150–400)
RBC: 4 MIL/uL (ref 3.87–5.11)
RDW: 15.4 % (ref 11.5–15.5)
WBC: 8.7 10*3/uL (ref 4.0–10.5)

## 2016-07-23 LAB — URINALYSIS, ROUTINE W REFLEX MICROSCOPIC
BILIRUBIN URINE: NEGATIVE
GLUCOSE, UA: NEGATIVE mg/dL
KETONES UR: NEGATIVE mg/dL
Leukocytes, UA: NEGATIVE
Nitrite: NEGATIVE
PH: 6.5 (ref 5.0–8.0)
Protein, ur: NEGATIVE mg/dL
Specific Gravity, Urine: 1.015 (ref 1.005–1.030)

## 2016-07-23 NOTE — Patient Instructions (Addendum)
Cancel December appt and f/u in 3 months, call if you need me sooner  Elevate feet  Stop canned food and change to fresh or frozen fruit and vegetables please  CMP today  You are  being referred in January for evaluation of circulation in legs  Please continue to work on stopping smoking

## 2016-07-24 ENCOUNTER — Ambulatory Visit: Payer: Medicaid Other | Admitting: Family Medicine

## 2016-07-24 LAB — COMPREHENSIVE METABOLIC PANEL
ALBUMIN: 3.9 g/dL (ref 3.6–5.1)
ALT: 12 U/L (ref 6–29)
AST: 23 U/L (ref 10–35)
Alkaline Phosphatase: 155 U/L — ABNORMAL HIGH (ref 33–130)
BUN: 10 mg/dL (ref 7–25)
CHLORIDE: 106 mmol/L (ref 98–110)
CO2: 24 mmol/L (ref 20–31)
CREATININE: 0.57 mg/dL (ref 0.50–1.05)
Calcium: 9.5 mg/dL (ref 8.6–10.4)
GLUCOSE: 85 mg/dL (ref 65–99)
Potassium: 3.9 mmol/L (ref 3.5–5.3)
SODIUM: 141 mmol/L (ref 135–146)
Total Bilirubin: 0.3 mg/dL (ref 0.2–1.2)
Total Protein: 6.8 g/dL (ref 6.1–8.1)

## 2016-07-25 ENCOUNTER — Other Ambulatory Visit (HOSPITAL_COMMUNITY): Payer: Medicaid Other

## 2016-07-27 ENCOUNTER — Encounter: Payer: Self-pay | Admitting: Family Medicine

## 2016-07-27 DIAGNOSIS — I739 Peripheral vascular disease, unspecified: Secondary | ICD-10-CM | POA: Insufficient documentation

## 2016-07-27 NOTE — Progress Notes (Signed)
   JALYSE MEERSMAN     MRN: [id number]      DOB: [birth year]   HPI Ms. Eichelberger is here for follow up and re-evaluation of chronic medical conditions, medication management and review of any available recent lab and radiology data.  Preventive health is updated, specifically  Cancer screening and Immunization.   Questions or concerns regarding consultations or procedures which the PT has had in the interim are  Addressed.has seen oncology and podiatry The PT denies any adverse reactions to current medications since the last visit.  C/o bilateral tight swollen legs, does not elevate legs, denies PND or orthopnea, even sleeps with legs hanging down   ROS Denies recent fever or chills. Denies sinus pressure, nasal congestion, ear pain or sore throat. Denies chest congestion, productive cough or wheezing. Denies chest pains, palpitations  Denies abdominal pain, nausea, vomiting,diarrhea or constipation.   Denies dysuria, frequency, hesitancy or incontinence. Denies headaches, seizures, numbness, or tingling. Denies  Uncontrolled depression, anxiety or insomnia.   PE  BP 122/78   Pulse 82   Resp 16   Ht 5\' 5"  (1.651 m)   Wt 158 lb (71.7 kg)   LMP 05/18/2016 (Exact Date)   SpO2 100%   BMI 26.29 kg/m   Patient alert and oriented and in no cardiopulmonary distress.  HEENT: No facial asymmetry, EOMI,   oropharynx pink and moist.  Neck supple no JVD, no mass.  Chest: Clear to auscultation bilaterally.Decreased air entry bilaterally  CVS: S1, S2 no murmurs, no S3.Regular rate.  ABD: Soft non tender.   Ext: one plus edema  MS: Adequate ROM spine, shoulders, hips and knees.  Skin:thickened skin with callus on soles, hyperpigmentation of skin of feet,.  Psych: Good eye contact, normal affect. Memory intact not anxious or depressed appearing.  CNS: CN 2-12 intact, power,  normal throughout.no focal deficits noted.   Assessment & Plan PVD (peripheral vascular disease) ([place]) bilateral  leg swelling with hyperpigmentation of skin of legs and reduced pulses. Advised leg elevation, stop smoking , refer to vascular for Jan eval Liver and kidney function checked. Exam and history negative for CHF as a cause  Chronic lymphocytic leukemia (CLL), B-cell ([place]) Being managed by oncology  ADJ DISORDER WITH MIXED ANXIETY & DEPRESSED MOOD Controlled, no change in medication   NICOTINE ADDICTION Patient counseled for approximately 5 minutes regarding the health risks of ongoing nicotine use, specifically all types of cancer, heart disease, stroke and respiratory failure. The options available for help with cessation ,the behavioral changes to assist the process, and the option to either gradully reduce usage  Or abruptly stop.is also discussed. Pt is also encouraged to set specific goals in number of cigarettes used daily, as well as to set a quit date.     Essential hypertension Controlled, no change in medication DASH diet and commitment to daily physical activity for a minimum of 30 minutes discussed and encouraged, as a part of hypertension management. The importance of attaining a healthy weight is also discussed.  BP/Weight 07/23/2016 07/23/2016 06/27/2016 06/06/2016 05/01/2016 04/03/2016 [id number]  Systolic BP Q000111Q [id number]  Diastolic BP 93 78 90 83 80 69 91  Wt. (Lbs) 158 158 159 159 157 157.3 -  BMI 26.29 26.29 26.46 26.46 26.13 26.18 -

## 2016-07-27 NOTE — Assessment & Plan Note (Signed)
Controlled, no change in medication  

## 2016-07-27 NOTE — Assessment & Plan Note (Signed)

## 2016-07-27 NOTE — Assessment & Plan Note (Signed)
bilateral leg swelling with hyperpigmentation of skin of legs and reduced pulses. Advised leg elevation, stop smoking , refer to vascular for Jan eval Liver and kidney function checked. Exam and history negative for CHF as a cause

## 2016-07-27 NOTE — Assessment & Plan Note (Signed)
Controlled, no change in medication DASH diet and commitment to daily physical activity for a minimum of 30 minutes discussed and encouraged, as a part of hypertension management. The importance of attaining a healthy weight is also discussed.  BP/Weight 07/23/2016 07/23/2016 06/27/2016 06/06/2016 05/01/2016 04/03/2016 [id number]  Systolic BP [id number] [id number] [id number]  Diastolic BP 93 78 90 83 80 69 91  Wt. (Lbs) 158 158 159 159 157 157.3 -  BMI 26.29 26.29 26.46 26.46 26.13 26.18 -

## 2016-07-27 NOTE — Assessment & Plan Note (Signed)
Being managed by oncology

## 2016-07-29 ENCOUNTER — Ambulatory Visit (HOSPITAL_COMMUNITY)
Admission: RE | Admit: 2016-07-29 | Discharge: 2016-07-29 | Disposition: A | Discharge status: Home / Self Care | Payer: Medicaid Other | Source: Ambulatory Visit | Attending: Hematology & Oncology | Admitting: Hematology & Oncology

## 2016-07-29 DIAGNOSIS — N83202 Unspecified ovarian cyst, left side: Secondary | ICD-10-CM | POA: Diagnosis present

## 2016-07-30 ENCOUNTER — Ambulatory Visit (HOSPITAL_COMMUNITY)
Admission: RE | Admit: 2016-07-30 | Discharge: 2016-07-30 | Disposition: A | Discharge status: Home / Self Care | Payer: Medicaid Other | Source: Ambulatory Visit | Attending: Obstetrics & Gynecology | Admitting: Obstetrics & Gynecology

## 2016-07-30 ENCOUNTER — Other Ambulatory Visit: Payer: Self-pay | Admitting: *Deleted

## 2016-07-30 ENCOUNTER — Ambulatory Visit (HOSPITAL_COMMUNITY): Payer: Medicaid Other | Admitting: Certified Registered Nurse Anesthetist

## 2016-07-30 ENCOUNTER — Encounter (HOSPITAL_COMMUNITY)
Admission: RE | Disposition: A | Discharge status: Home / Self Care | Payer: Self-pay | Source: Ambulatory Visit | Attending: Obstetrics & Gynecology

## 2016-07-30 ENCOUNTER — Encounter (HOSPITAL_COMMUNITY): Payer: Self-pay | Admitting: *Deleted

## 2016-07-30 DIAGNOSIS — N840 Polyp of corpus uteri: Secondary | ICD-10-CM | POA: Insufficient documentation

## 2016-07-30 DIAGNOSIS — R609 Edema, unspecified: Secondary | ICD-10-CM

## 2016-07-30 DIAGNOSIS — R938 Abnormal findings on diagnostic imaging of other specified body structures: Secondary | ICD-10-CM | POA: Diagnosis not present

## 2016-07-30 DIAGNOSIS — Z809 Family history of malignant neoplasm, unspecified: Secondary | ICD-10-CM | POA: Insufficient documentation

## 2016-07-30 DIAGNOSIS — F17210 Nicotine dependence, cigarettes, uncomplicated: Secondary | ICD-10-CM | POA: Diagnosis not present

## 2016-07-30 DIAGNOSIS — I739 Peripheral vascular disease, unspecified: Secondary | ICD-10-CM | POA: Insufficient documentation

## 2016-07-30 DIAGNOSIS — N950 Postmenopausal bleeding: Secondary | ICD-10-CM | POA: Diagnosis not present

## 2016-07-30 DIAGNOSIS — F418 Other specified anxiety disorders: Secondary | ICD-10-CM | POA: Diagnosis not present

## 2016-07-30 DIAGNOSIS — I10 Essential (primary) hypertension: Secondary | ICD-10-CM | POA: Insufficient documentation

## 2016-07-30 DIAGNOSIS — C9110 Chronic lymphocytic leukemia of B-cell type not having achieved remission: Secondary | ICD-10-CM | POA: Diagnosis not present

## 2016-07-30 HISTORY — PX: HYSTEROSCOPY WITH D & C: SHX1775

## 2016-07-30 SURGERY — DILATATION AND CURETTAGE /HYSTEROSCOPY
Anesthesia: General

## 2016-07-30 MED ORDER — SODIUM CHLORIDE 0.9 % IR SOLN
Status: DC | PRN
  Administered 2016-07-30: 3000 mL

## 2016-07-30 MED ORDER — PROPOFOL 10 MG/ML IV BOLUS
INTRAVENOUS | Status: DC | PRN
  Administered 2016-07-30: 150 mg via INTRAVENOUS

## 2016-07-30 MED ORDER — ONDANSETRON HCL 4 MG/2ML IJ SOLN
4.0000 mg | Freq: Once | INTRAMUSCULAR | Status: AC
  Administered 2016-07-30: 4 mg via INTRAVENOUS

## 2016-07-30 MED ORDER — LACTATED RINGERS IV SOLN
INTRAVENOUS | Status: DC
  Administered 2016-07-30: 09:00:00 via INTRAVENOUS

## 2016-07-30 MED ORDER — KETOROLAC TROMETHAMINE 30 MG/ML IJ SOLN
30.0000 mg | Freq: Once | INTRAMUSCULAR | Status: AC
  Administered 2016-07-30: 30 mg via INTRAVENOUS

## 2016-07-30 MED ORDER — FENTANYL CITRATE (PF) 100 MCG/2ML IJ SOLN
INTRAMUSCULAR | Status: AC
  Filled 2016-07-30: qty 2

## 2016-07-30 MED ORDER — CEFAZOLIN SODIUM-DEXTROSE 2-4 GM/100ML-% IV SOLN
2.0000 g | INTRAVENOUS | Status: AC
  Administered 2016-07-30: 2 g via INTRAVENOUS
  Filled 2016-07-30: qty 100

## 2016-07-30 MED ORDER — FENTANYL CITRATE (PF) 100 MCG/2ML IJ SOLN
25.0000 ug | INTRAMUSCULAR | Status: DC | PRN
  Administered 2016-07-30: 50 ug via INTRAVENOUS
  Filled 2016-07-30: qty 2

## 2016-07-30 MED ORDER — FENTANYL CITRATE (PF) 100 MCG/2ML IJ SOLN
25.0000 ug | INTRAMUSCULAR | Status: AC | PRN
  Administered 2016-07-30 (×2): 25 ug via INTRAVENOUS

## 2016-07-30 MED ORDER — KETOROLAC TROMETHAMINE 30 MG/ML IJ SOLN
INTRAMUSCULAR | Status: AC
  Filled 2016-07-30: qty 1

## 2016-07-30 MED ORDER — PROPOFOL 10 MG/ML IV BOLUS
INTRAVENOUS | Status: AC
  Filled 2016-07-30: qty 20

## 2016-07-30 MED ORDER — MIDAZOLAM HCL 2 MG/2ML IJ SOLN
1.0000 mg | INTRAMUSCULAR | Status: DC | PRN
  Administered 2016-07-30: 2 mg via INTRAVENOUS

## 2016-07-30 MED ORDER — FENTANYL CITRATE (PF) 100 MCG/2ML IJ SOLN
INTRAMUSCULAR | Status: AC
Start: 2016-07-30 — End: 2016-07-30
  Filled 2016-07-30: qty 2

## 2016-07-30 MED ORDER — MIDAZOLAM HCL 2 MG/2ML IJ SOLN
INTRAMUSCULAR | Status: AC
  Filled 2016-07-30: qty 2

## 2016-07-30 MED ORDER — LIDOCAINE HCL (PF) 1 % IJ SOLN
INTRAMUSCULAR | Status: DC | PRN
  Administered 2016-07-30: 50 mg

## 2016-07-30 MED ORDER — KETOROLAC TROMETHAMINE 10 MG PO TABS: 10.0000 mg | ORAL_TABLET | Freq: Three times a day (TID) | ORAL | 0 refills | Status: DC | PRN

## 2016-07-30 MED ORDER — LIDOCAINE HCL (PF) 1 % IJ SOLN
INTRAMUSCULAR | Status: AC
  Filled 2016-07-30: qty 5

## 2016-07-30 MED ORDER — ONDANSETRON HCL 8 MG PO TABS: 8.0000 mg | ORAL_TABLET | Freq: Three times a day (TID) | ORAL | 0 refills | Status: DC | PRN

## 2016-07-30 MED ORDER — ONDANSETRON HCL 4 MG/2ML IJ SOLN
INTRAMUSCULAR | Status: AC
Start: 2016-07-30 — End: 2016-07-30
  Filled 2016-07-30: qty 2

## 2016-07-30 MED ORDER — FENTANYL CITRATE (PF) 100 MCG/2ML IJ SOLN
INTRAMUSCULAR | Status: DC | PRN
  Administered 2016-07-30: 50 ug via INTRAVENOUS
  Administered 2016-07-30 (×2): 25 ug via INTRAVENOUS

## 2016-07-30 SURGICAL SUPPLY — 26 items
BAG HAMPER (MISCELLANEOUS) ×3 IMPLANT
CLOTH BEACON ORANGE TIMEOUT ST (SAFETY) ×3 IMPLANT
COVER LIGHT HANDLE STERIS (MISCELLANEOUS) ×6 IMPLANT
FORMALIN 10 PREFIL 120ML (MISCELLANEOUS) ×3 IMPLANT
GAUZE SPONGE 4X4 16PLY XRAY LF (GAUZE/BANDAGES/DRESSINGS) ×3 IMPLANT
GLOVE BIOGEL PI IND STRL 7.0 (GLOVE) ×1 IMPLANT
GLOVE BIOGEL PI IND STRL 8 (GLOVE) ×1 IMPLANT
GLOVE BIOGEL PI INDICATOR 7.0 (GLOVE) ×6
GLOVE BIOGEL PI INDICATOR 8 (GLOVE) ×2
GLOVE ECLIPSE 6.5 STRL STRAW (GLOVE) ×2 IMPLANT
GLOVE ECLIPSE 8.0 STRL XLNG CF (GLOVE) ×3 IMPLANT
GOWN STRL REUS W/TWL LRG LVL3 (GOWN DISPOSABLE) ×6 IMPLANT
GOWN STRL REUS W/TWL XL LVL3 (GOWN DISPOSABLE) ×3 IMPLANT
INST SET HYSTEROSCOPY (KITS) ×3 IMPLANT
IV NS IRRIG 3000ML ARTHROMATIC (IV SOLUTION) ×2 IMPLANT
KIT ROOM TURNOVER AP CYSTO (KITS) ×3 IMPLANT
MANIFOLD NEPTUNE II (INSTRUMENTS) ×3 IMPLANT
NS IRRIG 1000ML POUR BTL (IV SOLUTION) ×3 IMPLANT
PACK BASIC III (CUSTOM PROCEDURE TRAY) ×3
PACK SRG BSC III STRL LF ECLPS (CUSTOM PROCEDURE TRAY) ×1 IMPLANT
PAD ARMBOARD 7.5X6 YLW CONV (MISCELLANEOUS) ×3 IMPLANT
PAD TELFA 3X4 1S STER (GAUZE/BANDAGES/DRESSINGS) ×3 IMPLANT
SET IRRIG Y TYPE TUR BLADDER L (SET/KITS/TRAYS/PACK) ×3 IMPLANT
SHEET LAVH (DRAPES) ×3 IMPLANT
TOWEL OR 17X26 4PK STRL BLUE (TOWEL DISPOSABLE) ×3 IMPLANT
YANKAUER SUCT BULB TIP 10FT TU (MISCELLANEOUS) ×3 IMPLANT

## 2016-07-30 NOTE — Anesthesia Procedure Notes (Signed)
Procedure Name: LMA Insertion Date/Time: 07/30/2016 9:14 AM Performed by: Raenette Rover Pre-anesthesia Checklist: Patient identified, Emergency Drugs available, Suction available and Patient being monitored Patient Re-evaluated:Patient Re-evaluated prior to inductionOxygen Delivery Method: Circle system utilized Preoxygenation: Pre-oxygenation with 100% oxygen Intubation Type: IV induction LMA: LMA inserted LMA Size: 4.0 Number of attempts: 1 Placement Confirmation: positive ETCO2,  CO2 detector and breath sounds checked- equal and bilateral Tube secured with: Tape Dental Injury: Teeth and Oropharynx as per pre-operative assessment

## 2016-07-30 NOTE — Anesthesia Postprocedure Evaluation (Signed)
Anesthesia Post Note  Patient: [name]  Procedure(s) Performed: Procedure(s) (LRB): DILATATION AND CURETTAGE /HYSTEROSCOPY (N/A)  Patient location during evaluation: Short Stay Anesthesia Type: General Level of consciousness: awake and alert Pain management: satisfactory to patient Vital Signs Assessment: post-procedure vital signs reviewed and stable Respiratory status: spontaneous breathing Cardiovascular status: stable Anesthetic complications: no    Last Vitals:  Vitals:   07/30/16 0905 07/30/16 0951  BP:  (!) (P) 146/96  Pulse:    Resp: 16 (P) 16  Temp:  (P) 36.9 C    Last Pain:  Vitals:   07/30/16 0838  TempSrc: Oral                 Mirinda Monte

## 2016-07-30 NOTE — H&P (Signed)
Preoperative History and Physical  [name] is a 55 y.o. No obstetric history on file. with Patient's last menstrual period was 07/25/2016. admitted for a hysteroscopy uterine curettage.  Pt presented with postmenopausal bleeding with sonogram revealing a thickend heterogenous endometrium Pt wioulod not be able to tolerate an office endometrial biopsy  PMH:    Past Medical History:  Diagnosis Date  . Anxiety   . Chronic abdominal pain    resolved  . H. pylori infection 2/09   s/p prevpac  . Hypertension   . Lymphadenopathy 01/17/2016  . Nicotine addiction   . Psychosis    social worker states that patient does not like to talk about this and will stop her meds if you try and discuss this with her.    PSH:     Past Surgical History:  Procedure Laterality Date  . AXILLARY LYMPH NODE BIOPSY Right 03/28/2016   Procedure: RIGHT AXILLARY LYMPH NODE BIOPSY;  Surgeon: Aviva Signs, MD;  Location: [facility];  Service: General;  Laterality: Right;  . CHOLECYSTECTOMY    . COLONOSCOPY  02/07/2011   SLF: Slightly tortuous colon.  Otherwise normal colon without evidence of polyps, masses, inflammatory changes, diverticular AVMs  . HAMMER TOE SURGERY Left   . Surgery of left middle finger on left hand (childhood )    . TUBAL LIGATION      POb/GynH:      OB History    No data available      SH:   Social History  Substance Use Topics  . Smoking status: Current Every Day Smoker    Packs/day: 0.25    Years: 16.00    Types: Cigarettes  . Smokeless tobacco: Never Used     Comment: 5 per day   . Alcohol use No    FH:    Family History  Problem Relation Age of Onset  . Cancer Mother      Allergies: No Known Allergies  Medications:       Current Facility-Administered Medications:  .  ceFAZolin (ANCEF) IVPB 2g/100 mL premix, 2 g, Intravenous, On Call to OR, Florian Buff, MD .  fentaNYL (SUBLIMAZE) injection 25 mcg, 25 mcg, Intravenous, Q5 min PRN, Lerry Liner, MD, 25 mcg at  07/30/16 0847 .  lactated ringers infusion, , Intravenous, Continuous, Lerry Liner, MD, Last Rate: 50 mL/hr at 07/30/16 0844 .  midazolam (VERSED) injection 1-2 mg, 1-2 mg, Intravenous, Q5 min PRN, Lerry Liner, MD, 2 mg at 07/30/16 0846  Review of Systems:   Review of Systems  Constitutional: Negative for fever, chills, weight loss, malaise/fatigue and diaphoresis.  HENT: Negative for hearing loss, ear pain, nosebleeds, congestion, sore throat, neck pain, tinnitus and ear discharge.   Eyes: Negative for blurred vision, double vision, photophobia, pain, discharge and redness.  Respiratory: Negative for cough, hemoptysis, sputum production, shortness of breath, wheezing and stridor.   Cardiovascular: Negative for chest pain, palpitations, orthopnea, claudication, leg swelling and PND.  Gastrointestinal: Positive for abdominal pain. Negative for heartburn, nausea, vomiting, diarrhea, constipation, blood in stool and melena.  Genitourinary: Negative for dysuria, urgency, frequency, hematuria and flank pain.  Musculoskeletal: Negative for myalgias, back pain, joint pain and falls.  Skin: Negative for itching and rash.  Neurological: Negative for dizziness, tingling, tremors, sensory change, speech change, focal weakness, seizures, loss of consciousness, weakness and headaches.  Endo/Heme/Allergies: Negative for environmental allergies and polydipsia. Does not bruise/bleed easily.  Psychiatric/Behavioral: Negative for depression, suicidal ideas, hallucinations, memory loss and substance abuse.  The patient is not nervous/anxious and does not have insomnia.      PHYSICAL EXAM:  Blood pressure (!) 143/87, pulse 82, temperature 98.3 F (36.8 C), temperature source Oral, resp. rate 17, last menstrual period 07/25/2016, SpO2 100 %.    Vitals reviewed. Constitutional: She is oriented to person, place, and time. She appears well-developed and well-nourished.  HENT:  Head: Normocephalic and  atraumatic.  Right Ear: External ear normal.  Left Ear: External ear normal.  Nose: Nose normal.  Mouth/Throat: Oropharynx is clear and moist.  Eyes: Conjunctivae and EOM are normal. Pupils are equal, round, and reactive to light. Right eye exhibits no discharge. Left eye exhibits no discharge. No scleral icterus.  Neck: Normal range of motion. Neck supple. No tracheal deviation present. No thyromegaly present.  Cardiovascular: Normal rate, regular rhythm, normal heart sounds and intact distal pulses.  Exam reveals no gallop and no friction rub.   No murmur heard. Respiratory: Effort normal and breath sounds normal. No respiratory distress. She has no wheezes. She has no rales. She exhibits no tenderness.  GI: Soft. Bowel sounds are normal. She exhibits no distension and no mass. There is tenderness. There is no rebound and no guarding.  Genitourinary:       Vulva is normal without lesions Vagina is pink moist without discharge Cervix normal in appearance and pap is normal Uterus is normal size, contour, position, consistency, mobility, non-tender Adnexa is negative with normal sized ovaries by sonogram  Musculoskeletal: Normal range of motion. She exhibits no edema and no tenderness.  Neurological: She is alert and oriented to person, place, and time. She has normal reflexes. She displays normal reflexes. No cranial nerve deficit. She exhibits normal muscle tone. Coordination normal.  Skin: Skin is warm and dry. No rash noted. No erythema. No pallor.  Psychiatric: She has a normal mood and affect. Her behavior is normal. Judgment and thought content normal.    Labs: Results for orders placed or performed during the hospital encounter of 07/23/16 (from the past 336 hour(s))  CBC   Collection Time: 07/23/16  1:27 PM  Result Value Ref Range   WBC 8.7 4.0 - 10.5 K/uL   RBC 4.00 3.87 - 5.11 MIL/uL   Hemoglobin 12.2 12.0 - 15.0 g/dL   HCT 36.2 36.0 - 46.0 %   MCV 90.5 78.0 - 100.0 fL    MCH 30.5 26.0 - 34.0 pg   MCHC 33.7 30.0 - 36.0 g/dL   RDW 15.4 11.5 - 15.5 %   Platelets 303 150 - 400 K/uL  Comprehensive metabolic panel   Collection Time: 07/23/16  1:27 PM  Result Value Ref Range   Sodium 137 135 - 145 mmol/L   Potassium 3.1 (L) 3.5 - 5.1 mmol/L   Chloride 103 101 - 111 mmol/L   CO2 27 22 - 32 mmol/L   Glucose, Bld 121 (H) 65 - 99 mg/dL   BUN 10 6 - 20 mg/dL   Creatinine, Ser 0.64 0.44 - 1.00 mg/dL   Calcium 9.3 8.9 - 10.3 mg/dL   Total Protein 7.6 6.5 - 8.1 g/dL   Albumin 3.8 3.5 - 5.0 g/dL   AST 27 15 - 41 U/L   ALT 16 14 - 54 U/L   Alkaline Phosphatase 134 (H) 38 - 126 U/L   Total Bilirubin 0.5 0.3 - 1.2 mg/dL   GFR calc non Af Amer >60 >60 mL/min   GFR calc Af Amer >60 >60 mL/min   Anion gap 7 5 -  15  Urinalysis, Routine w reflex microscopic (not at [facility])   Collection Time: 07/23/16  1:27 PM  Result Value Ref Range   Color, Urine YELLOW YELLOW   APPearance CLEAR CLEAR   Specific Gravity, Urine 1.015 1.005 - 1.030   pH 6.5 5.0 - 8.0   Glucose, UA NEGATIVE NEGATIVE mg/dL   Hgb urine dipstick LARGE (A) NEGATIVE   Bilirubin Urine NEGATIVE NEGATIVE   Ketones, ur NEGATIVE NEGATIVE mg/dL   Protein, ur NEGATIVE NEGATIVE mg/dL   Nitrite NEGATIVE NEGATIVE   Leukocytes, UA NEGATIVE NEGATIVE  Urine microscopic-add on   Collection Time: 07/23/16  1:27 PM  Result Value Ref Range   Squamous Epithelial / LPF 6-30 (A) NONE SEEN   WBC, UA 0-5 0 - 5 WBC/hpf   RBC / HPF 6-30 0 - 5 RBC/hpf   Bacteria, UA FEW (A) NONE SEEN  Results for orders placed or performed in visit on 07/23/16 (from the past 336 hour(s))  Comprehensive metabolic panel   Collection Time: 07/23/16  3:12 PM  Result Value Ref Range   Sodium 141 135 - 146 mmol/L   Potassium 3.9 3.5 - 5.3 mmol/L   Chloride 106 98 - 110 mmol/L   CO2 24 20 - 31 mmol/L   Glucose, Bld 85 65 - 99 mg/dL   BUN 10 7 - 25 mg/dL   Creat 0.57 0.50 - 1.05 mg/dL   Total Bilirubin 0.3 0.2 - 1.2 mg/dL   Alkaline  Phosphatase 155 (H) 33 - 130 U/L   AST 23 10 - 35 U/L   ALT 12 6 - 29 U/L   Total Protein 6.8 6.1 - 8.1 g/dL   Albumin 3.9 3.6 - 5.1 g/dL   Calcium 9.5 8.6 - 10.4 mg/dL    EKG: Orders placed or performed during the hospital encounter of 03/21/16  . EKG 12-Lead  . EKG 12-Lead    Imaging Studies: US Pelvis Complete  Result Date: 07/29/2016 CLINICAL DATA:  History of left ovarian cyst. EXAM: TRANSABDOMINAL ULTRASOUND OF PELVIS TECHNIQUE: Transabdominal ultrasound examination of the pelvis was performed including evaluation of the uterus, ovaries, adnexal regions, and pelvic cul-de-sac. COMPARISON:  Pelvic ultrasound performed at [place] 06/27/2016. CT abdomen and pelvis 02/14/2016 and 06/25/2016. FINDINGS: Uterus Measurements: 6.8 x 3.7 x 4.2 cm. No fibroids or other mass visualized. Endometrium Thickness: 0.4 cm.  No focal abnormality visualized. Right ovary Measurements: 2.1 x 1.2 x 1.3 cm. Normal appearance/no adnexal mass. Left ovary Measurements: 4.1 x 3.1 x 3.1 cm. A cyst measuring 2.9 x 2.4 x 2.5 cm is identified. On the prior ultrasound, the cyst measured 3.7 x 3.3 x 3.4 cm. It is not present on the 02/14/2016 CT. No mural nodule or septation is seen within the cyst. Small focus of mild flow is identified about the periphery of the cyst. Other findings:  No abnormal free fluid. IMPRESSION: Left ovarian cyst has benign characteristics and is decreased in size since the most recent ultrasound. This is almost certainly benign, but follow up ultrasound is recommended in 1 year according to the Society of Radiologists in Ultrasound 2010 Consensus Conference Statement (D Clovis Riley et al. Management of Asymptomatic Ovarian and Other Adnexal Cysts Imaged at [place]: Society of Radiologists in [place] Statement 2010. Radiology 256 (Sept 2010): [id number].) Electronically Signed   By: Inge Rise M.D.   On: 07/29/2016 13:38      Assessment: Postmenopausal bleeding,  unable to sample endometrium in the office thickened endometrium Patient Active  Problem List   Diagnosis Date Noted  . PVD (peripheral vascular disease) ([place]) 07/27/2016  . Chronic lymphocytic leukemia (CLL), B-cell ([place]) 04/04/2016  . Mass of right side of neck 01/21/2016  . Lymphadenopathy 01/17/2016  . Dermatomycosis 01/17/2016  . Tinea pedis 11/13/2015  . Depression with anxiety 07/07/2015  . Inflammatory arthritis 04/18/2015  . Bilateral hand pain 02/26/2015  . Toe pain, bilateral 02/26/2015  . Pain due to onychomycosis of toenail 12/10/2014  . 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 Rheumatoid arthritis ([place]) 02/12/2012  . ADJ DISORDER WITH MIXED ANXIETY & DEPRESSED MOOD 08/01/2010  . NICOTINE ADDICTION 04/29/2010  . HAND PAIN, BILATERAL 01/29/2009  . WEIGHT LOSS 04/24/2008  . Essential hypertension 11/24/2007    Plan: Hysteroscopy uterine curettage  Carisma Troupe H 07/30/2016 8:56 AM

## 2016-07-30 NOTE — Op Note (Signed)
Preoperative diagnosis: Postmenopausal bleeding                                         Thickened endometrium, unable to do OP biopsy   Postoperative diagnosis: Endometrial polyp x 1   Procedure: Operative hysteroscopy with removal of  polyp,   Surgeon: Florian Buff   Anesthesia: Laryngeal mask airway   Findings: The patient experienced postmenopausal bleeding and we did a sonogram in the office which revealed a thickened endometrial stripe of 7 mm. unable to perform an office biopsy      Description of operation: Patient was taken to the operating room and placed in the supine position where she underwent laryngeal mask airway anesthesia. She was placed in the dorsal lithotomy position.  She was prepped and draped in the usual sterile fashion.  A Graves speculum was placed.  The cervix was grasped with a single-tooth tenaculum.  The cervix was dilated serially using Hegar dilators to allow passage of the hysteroscope.  The hysteroscope was then placed into the endometrial cavity without difficulty and the above noted findings were seen.  Randall stone forceps ring forceps and the uterine curet were used to remove the  polyp The endometrium was thoroughly curetted with good uterine cry in all areas  Hysteroscopic reevaluation confirmed the removal of all endometrial pathology  There was good hemostasis with only suspected minor endometrial using  The patient tolerated the procedure well  She experienced minimal blood loss  She was taken to the recovery room in good stable condition  All counts were correct x3  She received 2 g of Ancef and 30 mg of Toradol preoperatively  She will be followed up in the office next week for postoperative visit and to review the pathology which I expect to be benign

## 2016-07-30 NOTE — Anesthesia Preprocedure Evaluation (Signed)
Anesthesia Evaluation  Patient identified by MRN, date of birth, ID band Patient awake    Reviewed: Allergy & Precautions, NPO status , Patient's Chart, lab work & pertinent test results  Airway Mallampati: II  TM Distance: >3 FB     Dental  (+) Teeth Intact   Pulmonary Current Smoker,    breath sounds clear to auscultation       Cardiovascular hypertension, Pt. on medications  Rhythm:Regular Rate:Normal     Neuro/Psych PSYCHIATRIC DISORDERS Anxiety Depression    GI/Hepatic negative GI ROS,   Endo/Other    Renal/GU      Musculoskeletal   Abdominal   Peds  Hematology   Anesthesia Other Findings   Reproductive/Obstetrics                             Anesthesia Physical Anesthesia Plan  ASA: II  Anesthesia Plan: General   Post-op Pain Management:    Induction: Intravenous  Airway Management Planned: LMA  Additional Equipment:   Intra-op Plan:   Post-operative Plan: Extubation in OR  Informed Consent: I have reviewed the patients History and Physical, chart, labs and discussed the procedure including the risks, benefits and alternatives for the proposed anesthesia with the patient or authorized representative who has indicated his/her understanding and acceptance.     Plan Discussed with:   Anesthesia Plan Comments:         Anesthesia Quick Evaluation

## 2016-07-30 NOTE — Discharge Instructions (Signed)
Hysteroscopy, Care After °Refer to this sheet in the next few weeks. These instructions provide you with information on caring for yourself after your procedure. Your health care provider may also give you more specific instructions. Your treatment has been planned according to current medical practices, but problems sometimes occur. Call your health care provider if you have any problems or questions after your procedure.  °WHAT TO EXPECT AFTER THE PROCEDURE °After your procedure, it is typical to have the following: °· You may have some cramping. This normally lasts for a couple days. °· You may have bleeding. This can vary from light spotting for a few days to menstrual-like bleeding for 3-7 days. °HOME CARE INSTRUCTIONS °· Rest for the first 1-2 days after the procedure. °· Only take over-the-counter or prescription medicines as directed by your health care provider. Do not take aspirin. It can increase the chances of bleeding. °· Take showers instead of baths for 2 weeks or as directed by your health care provider. °· Do not drive for 24 hours or as directed. °· Do not drink alcohol while taking pain medicine. °· Do not use tampons, douche, or have sexual intercourse for 2 weeks or until your health care provider says it is okay. °· Take your temperature twice a day for 4-5 days. Write it down each time. °· Follow your health care provider's advice about diet, exercise, and lifting. °· If you develop constipation, you may: °¨ Take a mild laxative if your health care provider approves. °¨ Add bran foods to your diet. °¨ Drink enough fluids to keep your urine clear or pale yellow. °· Try to have someone with you or available to you for the first 24-48 hours, especially if you were given a general anesthetic. °· Follow up with your health care provider as directed. °SEEK MEDICAL CARE IF: °· You feel dizzy or lightheaded. °· You feel sick to your stomach (nauseous). °· You have abnormal vaginal discharge. °· You  have a rash. °· You have pain that is not controlled with medicine. °SEEK IMMEDIATE MEDICAL CARE IF: °· You have bleeding that is heavier than a normal menstrual period. °· You have a fever. °· You have increasing cramps or pain, not controlled with medicine. °· You have new belly (abdominal) pain. °· You pass out. °· You have pain in the tops of your shoulders (shoulder strap areas). °· You have shortness of breath. °This information is not intended to replace advice given to you by your health care provider. Make sure you discuss any questions you have with your health care provider. °Document Released: 06/22/2013 Document Reviewed: 06/22/2013 °Elsevier Interactive Patient Education © 2017 [employer] ° °

## 2016-07-30 NOTE — Transfer of Care (Signed)
Immediate Anesthesia Transfer of Care Note  Patient: [name]  Procedure(s) Performed: Procedure(s): DILATATION AND CURETTAGE /HYSTEROSCOPY (N/A)  Patient Location: PACU  Anesthesia Type:General  Level of Consciousness: awake, alert , oriented and patient cooperative  Airway & Oxygen Therapy: Patient Spontanous Breathing and Patient connected to nasal cannula oxygen  Post-op Assessment: Report given to RN and Post -op Vital signs reviewed and stable  Post vital signs: Reviewed and stable  Last Vitals:  Vitals:   07/30/16 0900 07/30/16 0905  BP: 121/79   Pulse:    Resp: 16 16  Temp:      Last Pain:  Vitals:   07/30/16 0838  TempSrc: Oral      Patients Stated Pain Goal: 5 ([id number])  Complications: No apparent anesthesia complications

## 2016-08-01 ENCOUNTER — Encounter (HOSPITAL_COMMUNITY): Payer: Self-pay | Admitting: Obstetrics & Gynecology

## 2016-08-12 ENCOUNTER — Encounter: Payer: Self-pay | Admitting: Obstetrics & Gynecology

## 2016-08-12 ENCOUNTER — Ambulatory Visit (INDEPENDENT_AMBULATORY_CARE_PROVIDER_SITE_OTHER): Payer: Medicaid Other | Admitting: Obstetrics & Gynecology

## 2016-08-12 VITALS — BP 110/80 | HR 80 | Wt 161.0 lb

## 2016-08-12 DIAGNOSIS — N840 Polyp of corpus uteri: Secondary | ICD-10-CM | POA: Diagnosis not present

## 2016-08-12 DIAGNOSIS — Z9889 Other specified postprocedural states: Secondary | ICD-10-CM

## 2016-08-12 NOTE — Progress Notes (Signed)
  HPI: Patient returns for routine postoperative follow-up having undergone hysteroscopy uterine curettage removal of endometrial polyp uterine curettage on 07/30/2016.  The patient's immediate postoperative recovery has been unremarkable. Since hospital discharge the patient reports no problems.   Current Outpatient Prescriptions: amLODipine (NORVASC) 10 MG tablet, TAKE 1 TABLET BY MOUTH ONCE DAILY., Disp: 30 tablet, Rfl: 2 ketorolac (TORADOL) 10 MG tablet, Take 1 tablet (10 mg total) by mouth every 8 (eight) hours as needed., Disp: 15 tablet, Rfl: 0 ondansetron (ZOFRAN) 8 MG tablet, Take 1 tablet (8 mg total) by mouth every 8 (eight) hours as needed for nausea., Disp: 12 tablet, Rfl: 0 PARoxetine (PAXIL) 20 MG tablet, Take 20 mg by mouth daily., Disp: , Rfl:  Vitamin D, Ergocalciferol, (DRISDOL) 50000 units CAPS capsule, Take 1 capsule (50,000 Units total) by mouth every 7 (seven) days., Disp: 4 capsule, Rfl: 5  No current facility-administered medications for this visit.     Blood pressure 110/80, pulse 80, weight 161 lb (73 kg), last menstrual period 07/25/2016.  Physical Exam: Normal post hysteroscopy  Diagnostic Tests:   Pathology: benign  Impression: S/p hysteroscopy removal of endometrial polyp  Plan:   Follow up: prn    Florian Buff, MD

## 2016-08-15 ENCOUNTER — Other Ambulatory Visit: Payer: Self-pay | Admitting: Family Medicine

## 2016-09-01 ENCOUNTER — Ambulatory Visit: Payer: Medicaid Other | Admitting: Family Medicine

## 2016-09-02 ENCOUNTER — Encounter (HOSPITAL_COMMUNITY): Payer: Medicaid Other | Attending: Hematology & Oncology

## 2016-09-02 ENCOUNTER — Ambulatory Visit (HOSPITAL_COMMUNITY): Payer: Medicaid Other | Admitting: Hematology & Oncology

## 2016-09-02 DIAGNOSIS — C9110 Chronic lymphocytic leukemia of B-cell type not having achieved remission: Secondary | ICD-10-CM | POA: Insufficient documentation

## 2016-09-02 LAB — LACTATE DEHYDROGENASE: LDH: 192 U/L (ref 98–192)

## 2016-09-02 LAB — CBC WITH DIFFERENTIAL/PLATELET
BASOS ABS: 0 10*3/uL (ref 0.0–0.1)
Basophils Relative: 0 %
Eosinophils Absolute: 0.1 10*3/uL (ref 0.0–0.7)
Eosinophils Relative: 1 %
HEMATOCRIT: 38.8 % (ref 36.0–46.0)
HEMOGLOBIN: 12.7 g/dL (ref 12.0–15.0)
LYMPHS ABS: 6 10*3/uL — AB (ref 0.7–4.0)
LYMPHS PCT: 55 %
MCH: 30.1 pg (ref 26.0–34.0)
MCHC: 32.7 g/dL (ref 30.0–36.0)
MCV: 91.9 fL (ref 78.0–100.0)
MONOS PCT: 6 %
Monocytes Absolute: 0.6 10*3/uL (ref 0.1–1.0)
Neutro Abs: 4.1 10*3/uL (ref 1.7–7.7)
Neutrophils Relative %: 38 %
Platelets: 256 10*3/uL (ref 150–400)
RBC: 4.22 MIL/uL (ref 3.87–5.11)
RDW: 15.4 % (ref 11.5–15.5)
WBC: 10.8 10*3/uL — AB (ref 4.0–10.5)

## 2016-09-02 LAB — COMPREHENSIVE METABOLIC PANEL
ALBUMIN: 4 g/dL (ref 3.5–5.0)
ALT: 14 U/L (ref 14–54)
AST: 24 U/L (ref 15–41)
Alkaline Phosphatase: 157 U/L — ABNORMAL HIGH (ref 38–126)
Anion gap: 8 (ref 5–15)
BILIRUBIN TOTAL: 0.4 mg/dL (ref 0.3–1.2)
BUN: 13 mg/dL (ref 6–20)
CO2: 26 mmol/L (ref 22–32)
Calcium: 9.6 mg/dL (ref 8.9–10.3)
Chloride: 102 mmol/L (ref 101–111)
Creatinine, Ser: 0.61 mg/dL (ref 0.44–1.00)
GFR calc Af Amer: >60 mL/min (ref >60)
GFR calc non Af Amer: >60 mL/min (ref >60)
GLUCOSE: 105 mg/dL — AB (ref 65–99)
POTASSIUM: 4 mmol/L (ref 3.5–5.1)
Sodium: 136 mmol/L (ref 135–145)
TOTAL PROTEIN: 7.9 g/dL (ref 6.5–8.1)

## 2016-09-03 ENCOUNTER — Other Ambulatory Visit (HOSPITAL_COMMUNITY): Payer: Medicaid Other

## 2016-09-03 ENCOUNTER — Ambulatory Visit (HOSPITAL_COMMUNITY): Payer: Medicaid Other | Admitting: Hematology & Oncology

## 2016-09-06 NOTE — Progress Notes (Signed)
This encounter was created in error - please disregard.

## 2016-09-16 ENCOUNTER — Emergency Department (HOSPITAL_COMMUNITY): Payer: Medicaid Other

## 2016-09-16 ENCOUNTER — Other Ambulatory Visit: Payer: Self-pay | Admitting: Family Medicine

## 2016-09-16 ENCOUNTER — Encounter (HOSPITAL_COMMUNITY): Payer: Self-pay | Admitting: Emergency Medicine

## 2016-09-16 ENCOUNTER — Observation Stay (HOSPITAL_COMMUNITY)
Admission: EM | Admit: 2016-09-16 | Discharge: 2016-09-17 | Disposition: A | Discharge status: Home / Self Care | Payer: Medicaid Other | Attending: Nephrology | Admitting: Nephrology

## 2016-09-16 DIAGNOSIS — S82142A Displaced bicondylar fracture of left tibia, initial encounter for closed fracture: Secondary | ICD-10-CM | POA: Diagnosis not present

## 2016-09-16 DIAGNOSIS — Y939 Activity, unspecified: Secondary | ICD-10-CM | POA: Insufficient documentation

## 2016-09-16 DIAGNOSIS — I10 Essential (primary) hypertension: Secondary | ICD-10-CM | POA: Diagnosis not present

## 2016-09-16 DIAGNOSIS — Z79899 Other long term (current) drug therapy: Secondary | ICD-10-CM | POA: Diagnosis not present

## 2016-09-16 DIAGNOSIS — Y999 Unspecified external cause status: Secondary | ICD-10-CM | POA: Diagnosis not present

## 2016-09-16 DIAGNOSIS — S82402A Unspecified fracture of shaft of left fibula, initial encounter for closed fracture: Secondary | ICD-10-CM | POA: Diagnosis present

## 2016-09-16 DIAGNOSIS — S8992XA Unspecified injury of left lower leg, initial encounter: Secondary | ICD-10-CM | POA: Diagnosis present

## 2016-09-16 DIAGNOSIS — Y92410 Unspecified street and highway as the place of occurrence of the external cause: Secondary | ICD-10-CM | POA: Diagnosis not present

## 2016-09-16 DIAGNOSIS — R262 Difficulty in walking, not elsewhere classified: Secondary | ICD-10-CM

## 2016-09-16 DIAGNOSIS — F17210 Nicotine dependence, cigarettes, uncomplicated: Secondary | ICD-10-CM | POA: Diagnosis not present

## 2016-09-16 HISTORY — DX: Chronic lymphocytic leukemia of B-cell type not having achieved remission: C91.10

## 2016-09-16 LAB — CBC WITH DIFFERENTIAL/PLATELET
BASOS ABS: 0 10*3/uL (ref 0.0–0.1)
BASOS PCT: 0 %
EOS ABS: 0.1 10*3/uL (ref 0.0–0.7)
Eosinophils Relative: 1 %
HCT: 35 % — ABNORMAL LOW (ref 36.0–46.0)
Hemoglobin: 11.7 g/dL — ABNORMAL LOW (ref 12.0–15.0)
Lymphocytes Relative: 38 %
Lymphs Abs: 5 10*3/uL — ABNORMAL HIGH (ref 0.7–4.0)
MCH: 30.6 pg (ref 26.0–34.0)
MCHC: 33.4 g/dL (ref 30.0–36.0)
MCV: 91.6 fL (ref 78.0–100.0)
Monocytes Absolute: 0.8 10*3/uL (ref 0.1–1.0)
Monocytes Relative: 6 %
Neutro Abs: 7.2 10*3/uL (ref 1.7–7.7)
Neutrophils Relative %: 55 %
Platelets: 284 10*3/uL (ref 150–400)
RBC: 3.82 MIL/uL — AB (ref 3.87–5.11)
RDW: 15.3 % (ref 11.5–15.5)
WBC: 13 10*3/uL — AB (ref 4.0–10.5)

## 2016-09-16 LAB — BASIC METABOLIC PANEL
Anion gap: 12 (ref 5–15)
BUN: 14 mg/dL (ref 6–20)
CALCIUM: 9.5 mg/dL (ref 8.9–10.3)
CO2: 22 mmol/L (ref 22–32)
CREATININE: 0.7 mg/dL (ref 0.44–1.00)
Chloride: 102 mmol/L (ref 101–111)
GFR calc non Af Amer: >60 mL/min (ref >60)
Glucose, Bld: 124 mg/dL — ABNORMAL HIGH (ref 65–99)
Potassium: 3.5 mmol/L (ref 3.5–5.1)
SODIUM: 136 mmol/L (ref 135–145)

## 2016-09-16 MED ORDER — OXYCODONE-ACETAMINOPHEN 5-325 MG PO TABS: 1.0000 | ORAL_TABLET | Freq: Four times a day (QID) | ORAL | 0 refills | Status: DC | PRN

## 2016-09-16 MED ORDER — HYDROMORPHONE HCL 1 MG/ML IJ SOLN
1.0000 mg | Freq: Once | INTRAMUSCULAR | Status: AC
  Administered 2016-09-16: 1 mg via INTRAVENOUS
  Filled 2016-09-16: qty 1

## 2016-09-16 MED ORDER — ACETAMINOPHEN 325 MG PO TABS: 650.0000 mg | ORAL_TABLET | Freq: Four times a day (QID) | ORAL | Status: DC | PRN

## 2016-09-16 MED ORDER — LORAZEPAM 2 MG/ML IJ SOLN
0.5000 mg | Freq: Once | INTRAMUSCULAR | Status: AC
  Administered 2016-09-16: 0.5 mg via INTRAVENOUS
  Filled 2016-09-16: qty 1

## 2016-09-16 MED ORDER — OXYCODONE-ACETAMINOPHEN 5-325 MG PO TABS
1.0000 | ORAL_TABLET | Freq: Once | ORAL | Status: AC
  Administered 2016-09-16: 1 via ORAL
  Filled 2016-09-16: qty 1

## 2016-09-16 MED ORDER — AMLODIPINE BESYLATE 5 MG PO TABS
10.0000 mg | ORAL_TABLET | Freq: Every day | ORAL | Status: DC
  Administered 2016-09-17: 10 mg via ORAL
  Filled 2016-09-16: qty 2

## 2016-09-16 MED ORDER — ONDANSETRON HCL 4 MG PO TABS: 4.0000 mg | ORAL_TABLET | Freq: Four times a day (QID) | ORAL | Status: DC | PRN

## 2016-09-16 MED ORDER — PAROXETINE HCL 20 MG PO TABS
20.0000 mg | ORAL_TABLET | Freq: Every day | ORAL | Status: DC
  Administered 2016-09-17: 20 mg via ORAL
  Filled 2016-09-16: qty 1

## 2016-09-16 MED ORDER — MORPHINE SULFATE (PF) 2 MG/ML IV SOLN
2.0000 mg | INTRAVENOUS | Status: DC | PRN
  Administered 2016-09-17 (×4): 2 mg via INTRAVENOUS
  Filled 2016-09-16 (×4): qty 1

## 2016-09-16 MED ORDER — ACETAMINOPHEN 650 MG RE SUPP: 650.0000 mg | Freq: Four times a day (QID) | RECTAL | Status: DC | PRN

## 2016-09-16 MED ORDER — ONDANSETRON HCL 4 MG/2ML IJ SOLN: 4.0000 mg | Freq: Four times a day (QID) | INTRAMUSCULAR | Status: DC | PRN

## 2016-09-16 MED ORDER — ENOXAPARIN SODIUM 40 MG/0.4ML ~~LOC~~ SOLN
40.0000 mg | SUBCUTANEOUS | Status: DC
  Administered 2016-09-17: 40 mg via SUBCUTANEOUS
  Filled 2016-09-16: qty 0.4

## 2016-09-16 NOTE — ED Notes (Signed)
Patient has attempted to call ride x3. Patient then verbalizes to Probation officer that she lives by herself and she is afraid to go home and fall and not be able to get up.  Dr. Roderic Palau made aware.

## 2016-09-16 NOTE — ED Notes (Addendum)
Assisted patient with bedpan. Tolerated well

## 2016-09-16 NOTE — ED Notes (Signed)
Patient requesting something for pain. Dr. Roderic Palau made aware.

## 2016-09-16 NOTE — Progress Notes (Signed)
tib plateau fracture  Depressed  xrays and CT done  NV intact by report  Rec knee brace toe touch WB  Office in am to discuss surgery

## 2016-09-16 NOTE — ED Notes (Signed)
Spoke with Jan with on-call DSS at [phone], states she will get in touch with her manager who will call us back with further information.

## 2016-09-16 NOTE — Discharge Instructions (Signed)
Follow up with dr. Aline Brochure at [facility] tomorrow.  Keep leg elevated.  Use a walker to ambulate with and put only a small amount of weight on your left leg

## 2016-09-16 NOTE — ED Provider Notes (Addendum)
[facility] DEPT Provider Note   CSN: [id number] Arrival date & time: 09/16/16  1348     History   Chief Complaint Chief Complaint  Patient presents with  . Fall    HPI [name] is a 56 y.o. female.  Patient states that she fell getting out of the bus. Patient states she hurt her left knee. Patient is unable to ambulate on left leg   The history is provided by the patient. No language interpreter was used.  Fall  This is a new problem. The current episode started 6 to 12 hours ago. The problem occurs constantly. The problem has not changed since onset.Pertinent negatives include no chest pain, no abdominal pain and no headaches. Exacerbated by: Movement of left flank. Nothing relieves the symptoms. She has tried nothing for the symptoms. The treatment provided no relief.    Past Medical History:  Diagnosis Date  . Anxiety   . Chronic abdominal pain    resolved  . H. pylori infection 2/09   s/p prevpac  . Hypertension   . Lymphadenopathy 01/17/2016  . Nicotine addiction   . Psychosis    social worker states that patient does not like to talk about this and will stop her meds if you try and discuss this with her.    Patient Active Problem List   Diagnosis Date Noted  . PVD (peripheral vascular disease) ([place]) 07/27/2016  . Chronic lymphocytic leukemia (CLL), B-cell ([place]) 04/04/2016  . Mass of right side of neck 01/21/2016  . Lymphadenopathy 01/17/2016  . Dermatomycosis 01/17/2016  . Tinea pedis 11/13/2015  . Depression with anxiety 07/07/2015  . Inflammatory arthritis 04/18/2015  . Bilateral hand pain 02/26/2015  . Toe pain, bilateral 02/26/2015  . Pain due to onychomycosis of toenail 12/10/2014  . 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 Rheumatoid arthritis ([place]) 02/12/2012  . ADJ DISORDER WITH MIXED ANXIETY & DEPRESSED MOOD 08/01/2010  . NICOTINE ADDICTION 04/29/2010  . HAND PAIN, BILATERAL 01/29/2009  . WEIGHT LOSS 04/24/2008  . Essential hypertension  11/24/2007    Past Surgical History:  Procedure Laterality Date  . AXILLARY LYMPH NODE BIOPSY Right 03/28/2016   Procedure: RIGHT AXILLARY LYMPH NODE BIOPSY;  Surgeon: Aviva Signs, MD;  Location: [facility];  Service: General;  Laterality: Right;  . CHOLECYSTECTOMY    . COLONOSCOPY  02/07/2011   SLF: Slightly tortuous colon.  Otherwise normal colon without evidence of polyps, masses, inflammatory changes, diverticular AVMs  . HAMMER TOE SURGERY Left   . HYSTEROSCOPY W/D&C N/A 07/30/2016   Procedure: DILATATION AND CURETTAGE /HYSTEROSCOPY;  Surgeon: Florian Buff, MD;  Location: [facility];  Service: Gynecology;  Laterality: N/A;  . Surgery of left middle finger on left hand (childhood )    . TUBAL LIGATION      OB History    No data available       Home Medications    Prior to Admission medications   Medication Sig Start Date End Date Taking? Authorizing Provider  amLODipine (NORVASC) 10 MG tablet TAKE 1 TABLET BY MOUTH ONCE DAILY. 08/15/16  Yes Fayrene Helper, MD  PARoxetine (PAXIL) 20 MG tablet TAKE 1 TABLET BY MOUTH ONCE A DAY. 09/16/16  Yes Fayrene Helper, MD  Vitamin D, Ergocalciferol, (DRISDOL) 50000 units CAPS capsule TAKE 1 CAPSULE BY MOUTH EVERY 7 DAYS. Patient taking differently: TAKE 1 CAPSULE BY MOUTH EVERY 7 DAYS ON SATURDAYS 08/15/16  Yes Fayrene Helper, MD  ketorolac (TORADOL) 10 MG tablet Take 1  tablet (10 mg total) by mouth every 8 (eight) hours as needed. Patient not taking: Reported on 09/16/2016 07/30/16   Florian Buff, MD  ondansetron (ZOFRAN) 8 MG tablet Take 1 tablet (8 mg total) by mouth every 8 (eight) hours as needed for nausea. Patient not taking: Reported on 09/16/2016 07/30/16   Florian Buff, MD  oxyCODONE-acetaminophen (PERCOCET/ROXICET) 5-325 MG tablet Take 1 tablet by mouth every 6 (six) hours as needed. 09/16/16   Milton Ferguson, MD  oxyCODONE-acetaminophen (PERCOCET/ROXICET) 5-325 MG tablet Take 1 tablet by mouth every 6 (six) hours as needed. 09/16/16    Milton Ferguson, MD    Family History Family History  Problem Relation Age of Onset  . Cancer Mother     Social History Social History  Substance Use Topics  . Smoking status: Current Every Day Smoker    Packs/day: 0.25    Years: 16.00    Types: Cigarettes  . Smokeless tobacco: Never Used     Comment: 5 per day   . Alcohol use No     Allergies   Patient has no known allergies.   Review of Systems Review of Systems  Constitutional: Negative for appetite change and fatigue.  HENT: Negative for congestion, ear discharge and sinus pressure.   Eyes: Negative for discharge.  Respiratory: Negative for cough.   Cardiovascular: Negative for chest pain.  Gastrointestinal: Negative for abdominal pain and diarrhea.  Genitourinary: Negative for frequency and hematuria.  Musculoskeletal: Negative for back pain.       Pain left leg  Skin: Negative for rash.  Neurological: Negative for seizures and headaches.  Psychiatric/Behavioral: Negative for hallucinations.     Physical Exam Updated Vital Signs BP 110/76   Pulse 70   Temp 98.7 F (37.1 C) (Oral)   Resp 20   Ht 5\' 5"  (1.651 m)   Wt 161 lb (73 kg)   LMP 07/25/2016   SpO2 93%   BMI 26.79 kg/m   Physical Exam  Constitutional: She is oriented to person, place, and time. She appears well-developed.  HENT:  Head: Normocephalic.  Eyes: Conjunctivae are normal.  Neck: No tracheal deviation present.  Cardiovascular:  No murmur heard. Musculoskeletal:  Tender swollen left knee. Neurovascular exam normal.  Neurological: She is oriented to person, place, and time.  Skin: Skin is warm.  Psychiatric: She has a normal mood and affect.     ED Treatments / Results  Labs (all labs ordered are listed, but only abnormal results are displayed) Labs Reviewed  CBC WITH DIFFERENTIAL/PLATELET - Abnormal; Notable for the following:       Result Value   WBC 13.0 (*)    RBC 3.82 (*)    Hemoglobin 11.7 (*)    HCT 35.0 (*)     Lymphs Abs 5.0 (*)    All other components within normal limits  BASIC METABOLIC PANEL - Abnormal; Notable for the following:    Glucose, Bld 124 (*)    All other components within normal limits    EKG  EKG Interpretation None       Radiology Dg Tibia/fibula Left  Result Date: 09/16/2016 CLINICAL DATA:  Status post fall. EXAM: LEFT TIBIA AND FIBULA - 2 VIEW COMPARISON:  None. FINDINGS: There is a slightly displaced, possibly comminuted, fracture within lateral portion of the tibial plateau, with associated mild depression of the lateral tibial plateau. Medial portion of the tibial plateau appears intact and normally aligned. Associated joint effusion is best seen within the suprapatellar fossa.  There is a probable additional slightly displaced fracture within the left fibular head. Distal left tibia and fibula appear intact and normally aligned. IMPRESSION: 1. Slightly displaced/depressed fracture, possibly comminuted, within the lateral portion of the tibial plateau. Suspect extension of the fracture line towards the midline tibial plateau. 2. No fracture seen within the medial portion of the tibial plateau. 3. Probable slightly displaced fracture within the left fibular head. 4. Distal left tibia and fibula are intact and normally aligned. Electronically Signed   By: Franki Cabot M.D.   On: 09/16/2016 18:15   Dg Knee Complete 4 Views Left  Result Date: 09/16/2016 CLINICAL DATA:  Left knee and lower leg pain after falling today. Pt states she fell while getting off the bus today and fell directly on her left knee. Denies previous injury or surgery to left knee or lower leg. EXAM: LEFT KNEE - COMPLETE 4+ VIEW COMPARISON:  None. FINDINGS: There is irregularity at the lateral aspect of the tibial plateau indicating slightly displaced plateau fracture. There is an associated slight depression of the lateral aspect of the tibial plateau. There is associated joint effusion, best seen in the  suprapatellar fossa. Medial portion of the tibial plateau appears intact. There is no fracture seen with distal femur. Probable slightly displaced fracture noted within the distal fibula. IMPRESSION: 1. Slightly displaced/depressed fracture within the lateral portion of the tibial plateau. Suspect fracture extension towards the midline tibial plateau. 2. Associated joint effusion. 3. No fracture or depression seen within the medial portion of the tibial plateau. 4. Questionable slightly displaced fracture within the left fibular head. Electronically Signed   By: Franki Cabot M.D.   On: 09/16/2016 18:12    Procedures Procedures (including critical care time)  Medications Ordered in ED Medications  oxyCODONE-acetaminophen (PERCOCET/ROXICET) 5-325 MG per tablet 1 tablet (not administered)  HYDROmorphone (DILAUDID) injection 1 mg (1 mg Intravenous Given 09/16/16 1714)  LORazepam (ATIVAN) injection 0.5 mg (0.5 mg Intravenous Given 09/16/16 1715)     Initial Impression / Assessment and Plan / ED Course  I have reviewed the triage vital signs and the nursing notes.  Pertinent labs & imaging results that were available during my care of the patient were reviewed by me and considered in my medical decision making (see chart for details).  Clinical Course     Patient has a tibial plateau fracture on the left knee. I spoke with orthopedic surgeon Dr. Aline Brochure. He stated to put the patient in the immobilizer. Use a walker to ambulate. Patient is to see Dr. Aline Brochure at 9 AM tomorrow.  Patient unable to take care of herself at home. Arrangements are being made to assist patient at home. If the arrangements cannot be made tonight she will be obs admitted  Final Clinical Impressions(s) / ED Diagnoses   Final diagnoses:  Closed fracture of left tibial plateau, initial encounter    New Prescriptions New Prescriptions   OXYCODONE-ACETAMINOPHEN (PERCOCET/ROXICET) 5-325 MG TABLET    Take 1 tablet by mouth  every 6 (six) hours as needed.   OXYCODONE-ACETAMINOPHEN (PERCOCET/ROXICET) 5-325 MG TABLET    Take 1 tablet by mouth every 6 (six) hours as needed.     Milton Ferguson, MD 09/16/16 2014    Milton Ferguson, MD 09/16/16 2207

## 2016-09-16 NOTE — ED Notes (Signed)
Patient transported to CT 

## 2016-09-16 NOTE — ED Notes (Signed)
Patient transported to Ultrasound 

## 2016-09-16 NOTE — ED Notes (Signed)
Patient transported to X-ray 

## 2016-09-16 NOTE — ED Notes (Signed)
Patient standing in bathroom without assistance.

## 2016-09-16 NOTE — ED Notes (Addendum)
Per Jan at [facility], [name] will call back in the morning from DSS for follow-up.  Phone Number: [phone].

## 2016-09-16 NOTE — ED Triage Notes (Signed)
Patient states she fell this morning when getting off of bus. States she fell on left knee. States pain to left knee. NAD in triage.

## 2016-09-16 NOTE — H&P (Signed)
History and Physical    [name] [id number] DOB: [birth year] DOA: 09/16/2016  PCP: Lori Nakayama, MD Consultants:  Foot doctor in [place]; [place] Patient coming from: Home - lives alone;  [place]: son (lives in [place]), [phone]  Chief Complaint: knee fracture  HPI: [name] is a 56 y.o. female with medical history significant of psychosis, anxiety, HTN, and CLL presenting with a tibial plateau fracture.  Patient fell out of the transportation [employer].  She attends [employer] and they went to [employer].  She was stepping out of the van and fell flat onto her knee and leg.  Pain is better now but the pain recurs when she lifts her leg or tries to move it around.   ED Course: Per Dr. Roderic Palau: Patient has a tibial plateau fracture on the left knee. I spoke with orthopedic surgeon Dr. Aline Fry. He stated to put the patient in the immobilizer. Use a walker to ambulate. Patient is to see Dr. Aline Fry at 9 AM tomorrow. Patient unable to take care of herself at home. Arrangements are being made to assist patient at home. If the arrangements cannot be made tonight she will be obs admitted   Review of Systems: As per HPI; otherwise 10 point review of systems reviewed and negative.   Ambulatory Status:  ambulated without assistance before this fall   Past Medical History:  Diagnosis Date  . Anxiety   . Chronic abdominal pain    resolved  . CLL (chronic lymphocytic leukemia) ([place])   . H. pylori infection 2/09   s/p prevpac  . Hypertension   . Lymphadenopathy 01/17/2016  . Nicotine addiction   . Psychosis    social worker states that patient does not like to talk about this and will stop her meds if you try and discuss this with her.    Past Surgical History:  Procedure Laterality Date  . AXILLARY LYMPH NODE BIOPSY Right 03/28/2016   Procedure: RIGHT AXILLARY LYMPH NODE BIOPSY;  Surgeon: Lori Signs, MD;  Location: [facility];  Service: General;  Laterality: Right;  .  CHOLECYSTECTOMY    . COLONOSCOPY  02/07/2011   SLF: Slightly tortuous colon.  Otherwise normal colon without evidence of polyps, masses, inflammatory changes, diverticular AVMs  . HAMMER TOE SURGERY Left   . HYSTEROSCOPY W/D&C N/A 07/30/2016   Procedure: DILATATION AND CURETTAGE /HYSTEROSCOPY;  Surgeon: Lori Buff, MD;  Location: [facility];  Service: Gynecology;  Laterality: N/A;  . Surgery of left middle finger on left hand (childhood )    . TUBAL LIGATION      Social History   Social History  . Marital status: Single    Spouse name: N/A  . Number of children: 2  . Years of education: N/A   Occupational History  . disabled    Social History Main Topics  . Smoking status: Current Every Day Smoker    Packs/day: 0.25    Years: 16.00    Types: Cigarettes  . Smokeless tobacco: Never Used     Comment: 5 per day   . Alcohol use No  . Drug use: No  . Sexual activity: No   Other Topics Concern  . Not on file   Social History Narrative   Lives alone    No Known Allergies  Family History  Problem Relation Age of Onset  . Cancer Mother     Prior to Admission medications   Medication Sig Start Date End Date Taking? Authorizing Provider  amLODipine (NORVASC) 10 MG tablet TAKE 1 TABLET BY MOUTH ONCE DAILY. 08/15/16  Yes Lori Helper, MD  PARoxetine (PAXIL) 20 MG tablet TAKE 1 TABLET BY MOUTH ONCE A DAY. 09/16/16  Yes Lori Helper, MD  Vitamin D, Ergocalciferol, (DRISDOL) 50000 units CAPS capsule TAKE 1 CAPSULE BY MOUTH EVERY 7 DAYS. Patient taking differently: TAKE 1 CAPSULE BY MOUTH EVERY 7 DAYS ON SATURDAYS 08/15/16  Yes Lori Helper, MD  ketorolac (TORADOL) 10 MG tablet Take 1 tablet (10 mg total) by mouth every 8 (eight) hours as needed. Patient not taking: Reported on 09/16/2016 07/30/16   Lori Buff, MD  ondansetron (ZOFRAN) 8 MG tablet Take 1 tablet (8 mg total) by mouth every 8 (eight) hours as needed for nausea. Patient not taking: Reported on 09/16/2016  07/30/16   Lori Buff, MD  oxyCODONE-acetaminophen (PERCOCET/ROXICET) 5-325 MG tablet Take 1 tablet by mouth every 6 (six) hours as needed. 09/16/16   Lori Ferguson, MD  oxyCODONE-acetaminophen (PERCOCET/ROXICET) 5-325 MG tablet Take 1 tablet by mouth every 6 (six) hours as needed. 09/16/16   Lori Ferguson, MD    Physical Exam: Vitals:   09/16/16 2130 09/16/16 2145 09/16/16 2200 09/16/16 2242  BP: 117/75  116/76 151/64  Pulse:  81  85  Resp:    20  Temp:    98.6 F (37 C)  TempSrc:    Oral  SpO2:  97%  97%  Weight:      Height:         General:  Appears calm and comfortable and is NAD; appears to have chronic mental illness - childlike responses to questions Eyes:  PERRL, EOMI, normal lids, iris ENT:  grossly normal hearing, lips & tongue, mmm Neck:  no LAD, masses or thyromegaly Cardiovascular:  RRR, no m/r/g. No LE edema.  Respiratory:  CTA bilaterally, no w/r/r. Normal respiratory effort. Abdomen:  soft, ntnd, NABS Skin:  no rash or induration seen on limited exam Musculoskeletal:  Left knee pain, mild edema Psychiatric:  grossly normal mood and affect, speech fluent and appropriate, AOx3 Neurologic:  CN 2-12 grossly intact, moves all extremities in coordinated fashion, sensation intact  Labs on Admission: I have personally reviewed following labs and imaging studies  CBC:  Recent Labs Lab 09/16/16 1851  WBC 13.0*  NEUTROABS 7.2  HGB 11.7*  HCT 35.0*  MCV 91.6  PLT [id number]   Basic Metabolic Panel:  Recent Labs Lab 09/16/16 1851  NA 136  K 3.5  CL 102  CO2 22  GLUCOSE 124*  BUN 14  CREATININE 0.70  CALCIUM 9.5   GFR: Estimated Creatinine Clearance: 78.6 mL/min (by C-G formula based on SCr of 0.7 mg/dL). Liver Function Tests: No results for input(s): AST, ALT, ALKPHOS, BILITOT, PROT, ALBUMIN in the last 168 hours. No results for input(s): LIPASE, AMYLASE in the last 168 hours. No results for input(s): AMMONIA in the last 168 hours. Coagulation  Profile: No results for input(s): INR, PROTIME in the last 168 hours. Cardiac Enzymes: No results for input(s): CKTOTAL, CKMB, CKMBINDEX, TROPONINI in the last 168 hours. BNP (last 3 results) No results for input(s): PROBNP in the last 8760 hours. HbA1C: No results for input(s): HGBA1C in the last 72 hours. CBG: No results for input(s): GLUCAP in the last 168 hours. Lipid Profile: No results for input(s): CHOL, HDL, LDLCALC, TRIG, CHOLHDL, LDLDIRECT in the last 72 hours. Thyroid Function Tests: No results for input(s): TSH, T4TOTAL, FREET4, T3FREE, THYROIDAB in the last 72  hours. Anemia Panel: No results for input(s): VITAMINB12, FOLATE, FERRITIN, TIBC, IRON, RETICCTPCT in the last 72 hours. Urine analysis:    Component Value Date/Time   COLORURINE YELLOW 07/23/2016 [place] 07/23/2016 1327   LABSPEC 1.015 07/23/2016 1327   PHURINE 6.5 07/23/2016 1327   GLUCOSEU NEGATIVE 07/23/2016 1327   GLUCOSEU NEG mg/dL 12/28/2009 1128   HGBUR LARGE (A) 07/23/2016 1327   HGBUR small 12/27/2009 0848   BILIRUBINUR NEGATIVE 07/23/2016 1327   BILIRUBINUR small 03/29/2012 1548   KETONESUR NEGATIVE 07/23/2016 1327   PROTEINUR NEGATIVE 07/23/2016 1327   UROBILINOGEN 1 03/29/2012 1642   NITRITE NEGATIVE 07/23/2016 1327   LEUKOCYTESUR NEGATIVE 07/23/2016 1327    Creatinine Clearance: Estimated Creatinine Clearance: 78.6 mL/min (by C-G formula based on SCr of 0.7 mg/dL).  Sepsis Labs: @LABRCNTIP (procalcitonin:4,lacticidven:4) )No results found for this or any previous visit (from the past 240 hour(s)).   Radiological Exams on Admission: Dg Tibia/fibula Left  Result Date: 09/16/2016 CLINICAL DATA:  Status post fall. EXAM: LEFT TIBIA AND FIBULA - 2 VIEW COMPARISON:  None. FINDINGS: There is a slightly displaced, possibly comminuted, fracture within lateral portion of the tibial plateau, with associated mild depression of the lateral tibial plateau. Medial portion of the tibial  plateau appears intact and normally aligned. Associated joint effusion is best seen within the suprapatellar fossa. There is a probable additional slightly displaced fracture within the left fibular head. Distal left tibia and fibula appear intact and normally aligned. IMPRESSION: 1. Slightly displaced/depressed fracture, possibly comminuted, within the lateral portion of the tibial plateau. Suspect extension of the fracture line towards the midline tibial plateau. 2. No fracture seen within the medial portion of the tibial plateau. 3. Probable slightly displaced fracture within the left fibular head. 4. Distal left tibia and fibula are intact and normally aligned. Electronically Signed   By: Franki Cabot M.D.   On: 09/16/2016 18:15   Ct Knee Left Wo Contrast  Result Date: 09/16/2016 CLINICAL DATA:  Acute tibial plateau and fibular head fractures. EXAM: CT OF THE LEFT KNEE WITHOUT CONTRAST TECHNIQUE: Multidetector CT imaging of the LEFT knee was performed according to the standard protocol. Multiplanar CT image reconstructions were also generated. COMPARISON:  Radiographs from earlier on the same day of the left tibia and fibula FINDINGS: Bones/Joint/Cartilage The following fractures are noted: 1. 2 mm depressed central lateral tibial plateau fracture. 2. Fractured at the base of the tibial spine. 15 x 5 mm ossific density slightly displaced anteriorly off the base of the spine. 3. Posterolateral corner fracture of the tibial epiphysis and metaphysis. 4. Nondisplaced sagittal fracture through the cortex of the posteromedial medial tibial plateau extending distally for approximately 3 cm. 5. Nondisplaced sagittal fracture through the medial fibular head. No focal chondral defect though assessment is limited on CT. Ligaments The ACL appears somewhat lax no full-thickness tear is apparent. The PCL appears intact. Soft tissue thickening along the course of the lateral collateral ligament cannot exclude LCL injury.  Muscles and Tendons Intact extensor mechanism.  No intramuscular hematoma. Soft tissues Lipohemarthrosis. IMPRESSION: The following fractures are noted: 1. 2 mm depressed central lateral tibial plateau fracture. 2. Fractured at the base of the tibial spine. 15 x 5 mm ossific density slightly displaced anteriorly off the base of the spine. 3. Posterolateral corner fracture of the tibial epiphysis and metaphysis. 4. Nondisplaced sagittal fracture through the cortex of the posteromedial medial tibial plateau extending distally for approximately 3 cm. 5. Nondisplaced sagittal fracture through the  medial fibular head. Fracture associated suprapatellar lipohemarthrosis. Lax appearing ACL with thickened appearing proximal LCL. Ligamentous injury is therefore not excluded. Electronically Signed   By: Ashley Royalty M.D.   On: 09/16/2016 20:20   Dg Knee Complete 4 Views Left  Result Date: 09/16/2016 CLINICAL DATA:  Left knee and lower leg pain after falling today. Pt states she fell while getting off the bus today and fell directly on her left knee. Denies previous injury or surgery to left knee or lower leg. EXAM: LEFT KNEE - COMPLETE 4+ VIEW COMPARISON:  None. FINDINGS: There is irregularity at the lateral aspect of the tibial plateau indicating slightly displaced plateau fracture. There is an associated slight depression of the lateral aspect of the tibial plateau. There is associated joint effusion, best seen in the suprapatellar fossa. Medial portion of the tibial plateau appears intact. There is no fracture seen with distal femur. Probable slightly displaced fracture noted within the distal fibula. IMPRESSION: 1. Slightly displaced/depressed fracture within the lateral portion of the tibial plateau. Suspect fracture extension towards the midline tibial plateau. 2. Associated joint effusion. 3. No fracture or depression seen within the medial portion of the tibial plateau. 4. Questionable slightly displaced fracture  within the left fibular head. Electronically Signed   By: Franki Cabot M.D.   On: 09/16/2016 18:12    EKG: not done  Assessment/Plan Principal Problem:   Tibial plateau fracture, left, closed, initial encounter Active Problems:   Closed left fibular fracture    -Patient with left tib/fib fractures s/p mechanical fall -Dr. Aline Fry wrote a note in the chart suggesting discharge to home with toe touch weight bearing and office f/u tomorrow -Unfortunately, patient has chronic (likely mental health) disability that prevents her from discharging to home independently tonight -Will observe overnight with plan for Case Management consult in AM -Will also consider inpatient orthopedics consultation to determine if surgical repair is needed - will defer to the day team about whether to do this inpatient or outpatient -Pain control with morphine -Has a [facility] Case manager, Foye Spurling, who will help to facilitate outpatient services tomorrow (was reached by ER nurse and she is unable to help overnight)   DVT prophylaxis: Lovenox  Code Status:  Full  Family Communication: None present - attempted to call son at the number she provided and was unable to reach him Disposition Plan: To be determined Consults called: Orthopedics (by ER); Case Manager Admission status: It is my clinical opinion that referral for OBSERVATION is reasonable and necessary in this patient based on the above information provided. The aforementioned taken together are felt to place the patient at high risk for further clinical deterioration. However it is anticipated that the patient may be medically stable for discharge from the hospital within 24 to 48 hours.    Karmen Bongo MD [facility]  If 7PM-7AM, please contact night-coverage [email or web address] Password [email or web address]  09/16/2016, 11:00 PM

## 2016-09-17 DIAGNOSIS — S82142A Displaced bicondylar fracture of left tibia, initial encounter for closed fracture: Secondary | ICD-10-CM

## 2016-09-17 LAB — BASIC METABOLIC PANEL
ANION GAP: 6 (ref 5–15)
BUN: 15 mg/dL (ref 6–20)
CO2: 25 mmol/L (ref 22–32)
Calcium: 8.8 mg/dL — ABNORMAL LOW (ref 8.9–10.3)
Chloride: 104 mmol/L (ref 101–111)
Creatinine, Ser: 0.64 mg/dL (ref 0.44–1.00)
GFR calc Af Amer: >60 mL/min (ref >60)
GFR calc non Af Amer: >60 mL/min (ref >60)
GLUCOSE: 113 mg/dL — AB (ref 65–99)
Potassium: 3.8 mmol/L (ref 3.5–5.1)
Sodium: 135 mmol/L (ref 135–145)

## 2016-09-17 LAB — CBC
HEMATOCRIT: 30.8 % — AB (ref 36.0–46.0)
HEMOGLOBIN: 10.4 g/dL — AB (ref 12.0–15.0)
MCH: 31 pg (ref 26.0–34.0)
MCHC: 33.8 g/dL (ref 30.0–36.0)
MCV: 91.9 fL (ref 78.0–100.0)
Platelets: 231 10*3/uL (ref 150–400)
RBC: 3.35 MIL/uL — ABNORMAL LOW (ref 3.87–5.11)
RDW: 15.4 % (ref 11.5–15.5)
WBC: 9.8 10*3/uL (ref 4.0–10.5)

## 2016-09-17 MED ORDER — ACETAMINOPHEN 325 MG PO TABS: 650.0000 mg | ORAL_TABLET | Freq: Four times a day (QID) | ORAL | Status: DC | PRN

## 2016-09-17 NOTE — Evaluation (Signed)
Physical Therapy Evaluation Patient Details Name: [name] MRN: [id number] DOB: 1960/11/20 Today's Date: 09/17/2016   History of Present Illness  [name] is a 56 y.o. female with medical history significant of psychosis, anxiety, HTN, and CLL presenting with a tibial plateau fracture.  Patient fell out of the transportation [place].  She attends [employer] and they went to [employer].  She was stepping out of the van and fell flat onto her knee and leg.  Pain is better now but the pain recurs when she lifts her leg or tries to move it around.  Pt is to be TTWB in a straight brace.    Clinical Impression  PT lives alone.  Therapist recommends SNF but pt is not agreeable to this at this time.  PT  States that her case worker is coming today and she will discuss it with her.      Follow Up Recommendations SNF    Equipment Recommendations  None recommended by PT    Recommendations for Other Services  OT     Precautions / Restrictions Precautions Precautions: Fall Restrictions Weight Bearing Restrictions: Yes LLE Weight Bearing: Touchdown weight bearing      Mobility  Bed Mobility Overal bed mobility: Needs Assistance Bed Mobility: Supine to Sit     Supine to sit: Min assist        Transfers Overall transfer level: Needs assistance Equipment used: Rolling walker (2 wheeled) Transfers: Stand Pivot Transfers   Stand pivot transfers: Min assist          Ambulation/Gait Ambulation/Gait assistance: Min assist Ambulation Distance (Feet): 5 Feet Assistive device: Rolling walker (2 wheeled) Gait Pattern/deviations: Step-to pattern   Gait velocity interpretation: Below normal speed for age/gender    Stairs            Wheelchair Mobility    Modified Rankin (Stroke Patients Only)       Balance                                             Pertinent Vitals/Pain Pain Assessment: 0-10 Pain Score: 8  Pain Location: knee Pain  Descriptors / Indicators: Aching Pain Intervention(s): Limited activity within patient's tolerance    Home Living Family/patient expects to be discharged to:: Private residence Living Arrangements: Alone   Type of Home: Apartment       Home Layout: One level Home Equipment: None      Prior Function Level of Independence: Independent               Hand Dominance        Extremity/Trunk Assessment        Lower Extremity Assessment Lower Extremity Assessment: LLE deficits/detail LLE: Unable to fully assess due to immobilization       Communication   Communication: No difficulties  Cognition Arousal/Alertness: Awake/alert Behavior During Therapy: WFL for tasks assessed/performed Overall Cognitive Status: Within Functional Limits for tasks assessed                      General Comments      Exercises     Assessment/Plan    PT Assessment Patient needs continued PT services  PT Problem List Decreased strength;Decreased activity tolerance;Decreased mobility;Decreased balance;Pain          PT Treatment Interventions Gait training;Functional mobility training;Therapeutic activities;Therapeutic exercise;Patient/family education  PT Goals (Current goals can be found in the Care Plan section)  Acute Rehab PT Goals Patient Stated Goal: to go home Time For Goal Achievement:  (therapist recommend SNF)    Frequency BID   Barriers to discharge        Co-evaluation               End of Session Equipment Utilized During Treatment: Gait belt Activity Tolerance: Patient tolerated treatment well;Patient limited by pain Patient left: in chair;with call bell/phone within reach           Time: 1120-1152 PT Time Calculation (min) (ACUTE ONLY): 32 min   Charges:   PT Evaluation $PT Eval Low Complexity: 1 Procedure     PT G CodesRayetta Humphrey, PT CLT [phone] 09/17/2016, 11:52 AM

## 2016-09-17 NOTE — Clinical Social Work Note (Signed)
Clinical Social Work Assessment  Patient Details  Name: [name] MRN: [id number] Date of Birth: 10/15/1960  Date of referral:  09/17/16               Reason for consult:  Discharge Planning                Permission sought to share information with:  Other (DSS caseworker) Permission granted to share information::  Yes, Verbal Permission Granted  Name::     Jennifer Reiter  Agency::     Relationship::  DSS caseworker  Contact Information:  [phone], [phone]  Housing/Transportation Living arrangements for the past 2 months:  Apartment Source of Information:  Patient, Other (Comment Required) (DSS caseworker) Patient Interpreter Needed:  None Criminal Activity/Legal Involvement Pertinent to Current Situation/Hospitalization:  No - Comment as needed Significant Relationships:  None Lives with:  Self Do you feel safe going back to the place where you live?  Yes Need for family participation in patient care:  Yes (Comment)  Care giving concerns:  Pt lives alone.    Social Worker assessment / plan:  CSW met with pt at bedside. Pt states she lives alone in an apartment. She has family, including a son and daughter, but she states she does not ask them for help. She has not notified them of admission because she doesn't want them to worry. Pt fell yesterday and has left tibial plateau fracture. PT evaluated pt and recommend SNF. CSW discussed placement process and pt adamantly refuses. She states she does not want to leave her apartment, even short term. Pt has DSS caseworker, Jennifer Reiter. Pt said she takes her to appointments and to pay bills. Pt requested that CSW call Jennifer. Pt and CSW spoke with Jennifer on phone. Jennifer is aware of SNF recommendation and understands that pt has refused. Pt is agreeable to home health services. CM notified. Jennifer will pick up pt this afternoon. RN and MD notified of plan. CSW signing off.   Employment status:  Disabled (Comment on  whether or not currently receiving Disability) Insurance information:  Medicaid In State PT Recommendations:  Skilled Nursing Facility Information / Referral to community resources:  Skilled Nursing Facility  Patient/Family's Response to care:  Pt refuses SNF and feels she can manage at home.   Patient/Family's Understanding of and Emotional Response to Diagnosis, Current Treatment, and Prognosis:  Pt is aware of PT recommendation for SNF and refuses placement at this time.   Emotional Assessment Appearance:  Appears stated age Attitude/Demeanor/Rapport:  Other (Cooperative) Affect (typically observed):  Calm Orientation:  Oriented to Self, Oriented to Place, Oriented to  Time, Oriented to Situation Alcohol / Substance use:  Not Applicable Psych involvement (Current and /or in the community):  No (Comment)  Discharge Needs  Concerns to be addressed:  Discharge Planning Concerns Readmission within the last 30 days:  No Current discharge risk:  Lives alone Barriers to Discharge:  No Barriers Identified   ,  [place], LCSW 09/17/2016, 12:19 PM [phone] 

## 2016-09-17 NOTE — Care Management Note (Signed)
Case Management Note  Patient Details  Name: KYIRA FRITZE MRN: [id number] Date of Birth: 1960-12-15    Expected Discharge Date:    09/17/2016             Expected Discharge Plan:  [place]  In-House Referral:  Clinical Social Work  Discharge planning Services  CM Consult  Post Acute Care Choice:  Home Health, Resumption of Svcs/PTA Provider Choice offered to:  Patient  DME Arranged:  Walker rolling DME Agency:  [place]:  RN, PT, OT, Nurse's Aide, Social Work [employer] Agency:  [facility]  Status of Service:  Completed, signed off  If discussed at [employer], dates discussed:    Additional Comments: Patient recommended for SNF but has declined. She lives alone and has DSS Education officer, museum who will be taking patient home this afternoon. Offered choice of [place] agencies. Romualdo Bolk of [facility] notified and will obtain orders from chart. Patient aware that [facility] has 48 hours to initiate services.   Sevag Shearn, Chauncey [place], RN 09/17/2016, 12:59 PM

## 2016-09-17 NOTE — Discharge Summary (Signed)
Physician Discharge Summary  [name] [id number] DOB: June 02, 1961 DOA: 09/16/2016  PCP: Tula Nakayama, MD  Admit date: 09/16/2016 Discharge date: 09/17/2016  Admitted From:Home Disposition:Home with home care. Refused rehabilitation or SNF on discharge.  Recommendations for Outpatient Follow-up:  1. Follow up with PCP in 1-2 weeks 2. Please obtain BMP/CBC in one week 3. Please follow-up with orthopedics in 1-2 weeks or with any change in condition.   Home Health: Yes Equipment/Devices: No Discharge Condition: Stable CODE STATUS: Full code Diet recommendation: Heart healthy  Brief/Interim Summary:56 y.o. female with medical history significant of psychosis, anxiety, HTN, and CLL presenting with a tibial plateau fracture.  Patient fell out of the transportation [employer].  She attends [employer] and they went to [employer].  She was stepping out of the van and fell flat onto her knee and leg.  Pain is better now but the pain recurs when she lifts her leg or tries to move it around.  The CT scan of left lower extremity showed as below: 2 mm depressed central lateral tibial plateau fracture. 2. Fractured at the base of the tibial spine. 15 x 5 mm ossific density slightly displaced anteriorly off the base of the spine. 3. Posterolateral corner fracture of the tibial epiphysis and metaphysis. 4. Nondisplaced sagittal fracture through the cortex of the posteromedial medial tibial plateau extending distally for approximately 3 cm. 5. Nondisplaced sagittal fracture through the medial fibular head. Fracture associated suprapatellar lipohemarthrosis   Patient was evaluated by orthopedics recommended nonoperative treatment with a straight leg brace for the next 4 weeks followed by hinged knee bracing for an additional 8 weeks with x-rays at 48 and 12 weeks. As per orthopedics, patient may need knee replacement in the future. Advised no weightbearing except toe touch. Patient verbalized  understanding.  Also evaluated by physical therapist and Education officer, museum. Recommended rehabilitation or SNF on discharge. Patient adamantly refused to go to rehabilitation or SNF. She said she has home care and her apartment. She wants to go home with home care services. Since patient refused rehabilitation or SNF on discharge, I'm discharging patient home with home care. Recommended outpatient follow-up with PCP and orthopedics. Also discussed with the social worker regarding discharge planning.  Discharge Diagnoses:  Principal Problem:   Tibial plateau fracture, left, closed, initial encounter Active Problems:   Closed left fibular fracture    Discharge Instructions  Discharge Instructions    Call MD for:  difficulty breathing, headache or visual disturbances    Complete by:  As directed    Call MD for:  hives    Complete by:  As directed    Call MD for:  persistant dizziness or light-headedness    Complete by:  As directed    Call MD for:  persistant nausea and vomiting    Complete by:  As directed    Call MD for:  severe uncontrolled pain    Complete by:  As directed    Call MD for:  temperature >100.4    Complete by:  As directed    Diet - low sodium heart healthy    Complete by:  As directed    Discharge instructions    Complete by:  As directed    1) Recommended nonoperative treatment with a straight leg brace for the next 4 weeks followed by hinged knee bracing for an additional 8 weeks with x-rays at 48 and 12 weeks. 2)toe-touch weightbearing   Walk with assistance    Complete by:  As directed    toe-touch weightbearing on left leg     Allergies as of 09/17/2016   No Known Allergies     Medication List    TAKE these medications   acetaminophen 325 MG tablet Commonly known as:  TYLENOL Take 2 tablets (650 mg total) by mouth every 6 (six) hours as needed for mild pain (or Fever >/= 101).   amLODipine 10 MG tablet Commonly known as:  NORVASC TAKE 1 TABLET BY MOUTH  ONCE DAILY.   oxyCODONE-acetaminophen 5-325 MG tablet Commonly known as:  PERCOCET/ROXICET Take 1 tablet by mouth every 6 (six) hours as needed.   oxyCODONE-acetaminophen 5-325 MG tablet Commonly known as:  PERCOCET/ROXICET Take 1 tablet by mouth every 6 (six) hours as needed.   PARoxetine 20 MG tablet Commonly known as:  PAXIL TAKE 1 TABLET BY MOUTH ONCE A DAY.   Vitamin D (Ergocalciferol) 50000 units Caps capsule Commonly known as:  DRISDOL TAKE 1 CAPSULE BY MOUTH EVERY 7 DAYS. What changed:  See the new instructions.      Follow-up Information    Arther Abbott, MD. Schedule an appointment as soon as possible for a visit in 2 week(s).   Specialties:  Orthopedic Surgery, Radiology Contact information: [address] [place] [address] [phone]        Tula Nakayama, MD. Schedule an appointment as soon as possible for a visit in 1 week(s).   Specialty:  Family Medicine Contact information: [address], [place] [address] [phone]          No Known Allergies  Consultations: Orthopedics  Procedures/Studies: Left leg splint  Subjective: Patient was seen and examined at bedside. Patient reported that her pain is better with pain medication. Denied fever, chills, headache, dizziness, nausea, vomiting, chest pain or shortness of breath. She reported that she tripped and had fall. Refused rehabilitation or SNF on discharge. wants to go home.   Discharge Exam: Vitals:   09/16/16 2343 09/17/16 0547  BP: 125/69 119/69  Pulse: 89 84  Resp: 20 20  Temp: 98.6 F (37 C) 98.3 F (36.8 C)   Vitals:   09/16/16 2200 09/16/16 2242 09/16/16 2343 09/17/16 0547  BP: 116/76 151/64 125/69 119/69  Pulse:  85 89 84  Resp:  20 20 20   Temp:  98.6 F (37 C) 98.6 F (37 C) 98.3 F (36.8 C)  TempSrc:  Oral Oral Oral  SpO2:  97% 97% 100%  Weight:   72.2 kg (159 lb 3.2 oz)   Height:   5\' 5"  (1.651 m)     General: Pt is alert, awake,  not in acute distress Cardiovascular: RRR, S1/S2 +, no rubs, no gallops Respiratory: CTA bilaterally, no wheezing, no rhonchi Abdominal: Soft, NT, ND, bowel sounds + Extremities: no edema, no cyanosis. Left leg has splint. Able to move toes. Warm to touch. Good circulation. Neurologic: Alert, awake, nonfocal neurological deficit. Oriented 3.  The results of significant diagnostics from this hospitalization (including imaging, microbiology, ancillary and laboratory) are listed below for reference.     Microbiology: No results found for this or any previous visit (from the past 240 hour(s)).   Labs: BNP (last 3 results) No results for input(s): BNP in the last 8760 hours. Basic Metabolic Panel:  Recent Labs Lab 09/16/16 1851 09/17/16 0459  NA 136 135  K 3.5 3.8  CL 102 104  CO2 22 25  GLUCOSE 124* 113*  BUN 14 15  CREATININE 0.70 0.64  CALCIUM 9.5 8.8*   Liver Function Tests: No results for input(s): AST, ALT, ALKPHOS, BILITOT, PROT, ALBUMIN in the last 168 hours. No results for input(s): LIPASE, AMYLASE in the last 168 hours. No results for input(s): AMMONIA in the last 168 hours. CBC:  Recent Labs Lab 09/16/16 1851 09/17/16 0459  WBC 13.0* 9.8  NEUTROABS 7.2  --   HGB 11.7* 10.4*  HCT 35.0* 30.8*  MCV 91.6 91.9  PLT 284 231   Cardiac Enzymes: No results for input(s): CKTOTAL, CKMB, CKMBINDEX, TROPONINI in the last 168 hours. BNP: Invalid input(s): POCBNP CBG: No results for input(s): GLUCAP in the last 168 hours. D-Dimer No results for input(s): DDIMER in the last 72 hours. Hgb A1c No results for input(s): HGBA1C in the last 72 hours. Lipid Profile No results for input(s): CHOL, HDL, LDLCALC, TRIG, CHOLHDL, LDLDIRECT in the last 72 hours. Thyroid function studies No results for input(s): TSH, T4TOTAL, T3FREE, THYROIDAB in the last 72 hours.  Invalid input(s): FREET3 Anemia work up No results for input(s): VITAMINB12, FOLATE, FERRITIN, TIBC, IRON,  RETICCTPCT in the last 72 hours. Urinalysis    Component Value Date/Time   COLORURINE YELLOW 07/23/2016 [place] 07/23/2016 1327   LABSPEC 1.015 07/23/2016 1327   PHURINE 6.5 07/23/2016 1327   GLUCOSEU NEGATIVE 07/23/2016 1327   GLUCOSEU NEG mg/dL 12/28/2009 1128   HGBUR LARGE (A) 07/23/2016 1327   HGBUR small 12/27/2009 0848   BILIRUBINUR NEGATIVE 07/23/2016 1327   BILIRUBINUR small 03/29/2012 1548   KETONESUR NEGATIVE 07/23/2016 1327   PROTEINUR NEGATIVE 07/23/2016 1327   UROBILINOGEN 1 03/29/2012 1642   NITRITE NEGATIVE 07/23/2016 1327   LEUKOCYTESUR NEGATIVE 07/23/2016 1327   Sepsis Labs Invalid input(s): PROCALCITONIN,  WBC,  LACTICIDVEN Microbiology No results found for this or any previous visit (from the past 240 hour(s)).   Time coordinating discharge: Over 30 minutes  SIGNED:   Rosita Fire, MD  [facility] 09/17/2016, 12:39 PM  If 7PM-7AM, please contact night-coverage [email or web address] Password [email or web address]

## 2016-09-17 NOTE — Consult Note (Signed)
Reason for Consult: Left tibial plateau fracture Referring Physician: DR Elita Quick is an 56 y.o. female.  HPI: 56 year old female fell getting out of a transport van on 09/16/2016 injured her left knee. Presented to the ER with pain and swelling without deformity neurovascularly intact  X-rays and CAT scan show a bicondylar tibial plateau fracture dislocation variant with central depression lateral tibial plateau, tibial spine avulsion fractures, sagittal medial tibial condyle fracture extending into the medial plateau.  She was unable to walk with a walker and knee immobilizer so she was admitted  Location of the pain is the left knee is nonradiating. Quality dull throbbing. Severity on a pain scale varies between 5 and 10. Duration one day. Timing worse with movement. Context stable. Modifying factors immobilization. Associated symptoms and signs swelling.  Past Medical History:  Diagnosis Date  . Anxiety   . Chronic abdominal pain    resolved  . CLL (chronic lymphocytic leukemia) ([place])   . H. pylori infection 2/09   s/p prevpac  . Hypertension   . Lymphadenopathy 01/17/2016  . Nicotine addiction   . Psychosis    social worker states that patient does not like to talk about this and will stop her meds if you try and discuss this with her.    Past Surgical History:  Procedure Laterality Date  . AXILLARY LYMPH NODE BIOPSY Right 03/28/2016   Procedure: RIGHT AXILLARY LYMPH NODE BIOPSY;  Surgeon: Aviva Signs, MD;  Location: [facility];  Service: General;  Laterality: Right;  . CHOLECYSTECTOMY    . COLONOSCOPY  02/07/2011   SLF: Slightly tortuous colon.  Otherwise normal colon without evidence of polyps, masses, inflammatory changes, diverticular AVMs  . HAMMER TOE SURGERY Left   . HYSTEROSCOPY W/D&C N/A 07/30/2016   Procedure: DILATATION AND CURETTAGE /HYSTEROSCOPY;  Surgeon: Florian Buff, MD;  Location: [facility];  Service: Gynecology;  Laterality: N/A;  . Surgery of left  middle finger on left hand (childhood )    . TUBAL LIGATION      Family History  Problem Relation Age of Onset  . Cancer Mother     Social History:  reports that she has been smoking Cigarettes.  She has a 4.00 pack-year smoking history. She has never used smokeless tobacco. She reports that she does not drink alcohol or use drugs.  Allergies: No Known Allergies  Medications:  Current Meds  Medication Sig  . amLODipine (NORVASC) 10 MG tablet TAKE 1 TABLET BY MOUTH ONCE DAILY.  Marland Kitchen PARoxetine (PAXIL) 20 MG tablet TAKE 1 TABLET BY MOUTH ONCE A DAY.  Marland Kitchen Vitamin D, Ergocalciferol, (DRISDOL) 50000 units CAPS capsule TAKE 1 CAPSULE BY MOUTH EVERY 7 DAYS. (Patient taking differently: TAKE 1 CAPSULE BY MOUTH EVERY 7 DAYS ON SATURDAYS)    Results for orders placed or performed during the hospital encounter of 09/16/16 (from the past 48 hour(s))  CBC with Differential     Status: Abnormal   Collection Time: 09/16/16  6:51 PM  Result Value Ref Range   WBC 13.0 (H) 4.0 - 10.5 K/uL   RBC 3.82 (L) 3.87 - 5.11 MIL/uL   Hemoglobin 11.7 (L) 12.0 - 15.0 g/dL   HCT 35.0 (L) 36.0 - 46.0 %   MCV 91.6 78.0 - 100.0 fL   MCH 30.6 26.0 - 34.0 pg   MCHC 33.4 30.0 - 36.0 g/dL   RDW 15.3 11.5 - 15.5 %   Platelets 284 150 - 400 K/uL    Comment: SPECIMEN CHECKED  FOR CLOTS PLATELET COUNT CONFIRMED BY SMEAR    Neutrophils Relative % 55 %   Neutro Abs 7.2 1.7 - 7.7 K/uL   Lymphocytes Relative 38 %   Lymphs Abs 5.0 (H) 0.7 - 4.0 K/uL   Monocytes Relative 6 %   Monocytes Absolute 0.8 0.1 - 1.0 K/uL   Eosinophils Relative 1 %   Eosinophils Absolute 0.1 0.0 - 0.7 K/uL   Basophils Relative 0 %   Basophils Absolute 0.0 0.0 - 0.1 K/uL   WBC Morphology ATYPICAL LYMPHOCYTES     Comment: WHITE COUNT CONFIRMED ON SMEAR SMUDGE CELLS    RBC Morphology SCHISTOCYTES NOTED ON SMEAR    Smear Review SPECIMEN CHECKED FOR CLOTS     Comment: LARGE PLATELETS PRESENT PLATELET COUNT CONFIRMED BY SMEAR   Basic metabolic  panel     Status: Abnormal   Collection Time: 09/16/16  6:51 PM  Result Value Ref Range   Sodium 136 135 - 145 mmol/L   Potassium 3.5 3.5 - 5.1 mmol/L   Chloride 102 101 - 111 mmol/L   CO2 22 22 - 32 mmol/L   Glucose, Bld 124 (H) 65 - 99 mg/dL   BUN 14 6 - 20 mg/dL   Creatinine, Ser 0.70 0.44 - 1.00 mg/dL   Calcium 9.5 8.9 - 10.3 mg/dL   GFR calc non Af Amer >60 >60 mL/min   GFR calc Af Amer >60 >60 mL/min    Comment: (NOTE) The eGFR has been calculated using the CKD EPI equation. This calculation has not been validated in all clinical situations. eGFR's persistently <60 mL/min signify possible Chronic Kidney Disease.    Anion gap 12 5 - 15  Basic metabolic panel     Status: Abnormal   Collection Time: 09/17/16  4:59 AM  Result Value Ref Range   Sodium 135 135 - 145 mmol/L   Potassium 3.8 3.5 - 5.1 mmol/L   Chloride 104 101 - 111 mmol/L   CO2 25 22 - 32 mmol/L   Glucose, Bld 113 (H) 65 - 99 mg/dL   BUN 15 6 - 20 mg/dL   Creatinine, Ser 0.64 0.44 - 1.00 mg/dL   Calcium 8.8 (L) 8.9 - 10.3 mg/dL   GFR calc non Af Amer >60 >60 mL/min   GFR calc Af Amer >60 >60 mL/min    Comment: (NOTE) The eGFR has been calculated using the CKD EPI equation. This calculation has not been validated in all clinical situations. eGFR's persistently <60 mL/min signify possible Chronic Kidney Disease.    Anion gap 6 5 - 15  CBC     Status: Abnormal   Collection Time: 09/17/16  4:59 AM  Result Value Ref Range   WBC 9.8 4.0 - 10.5 K/uL   RBC 3.35 (L) 3.87 - 5.11 MIL/uL   Hemoglobin 10.4 (L) 12.0 - 15.0 g/dL   HCT 30.8 (L) 36.0 - 46.0 %   MCV 91.9 78.0 - 100.0 fL   MCH 31.0 26.0 - 34.0 pg   MCHC 33.8 30.0 - 36.0 g/dL   RDW 15.4 11.5 - 15.5 %   Platelets 231 150 - 400 K/uL    Dg Tibia/fibula Left  Result Date: 09/16/2016 CLINICAL DATA:  Status post fall. EXAM: LEFT TIBIA AND FIBULA - 2 VIEW COMPARISON:  None. FINDINGS: There is a slightly displaced, possibly comminuted, fracture within  lateral portion of the tibial plateau, with associated mild depression of the lateral tibial plateau. Medial portion of the tibial plateau appears intact and normally  aligned. Associated joint effusion is best seen within the suprapatellar fossa. There is a probable additional slightly displaced fracture within the left fibular head. Distal left tibia and fibula appear intact and normally aligned. IMPRESSION: 1. Slightly displaced/depressed fracture, possibly comminuted, within the lateral portion of the tibial plateau. Suspect extension of the fracture line towards the midline tibial plateau. 2. No fracture seen within the medial portion of the tibial plateau. 3. Probable slightly displaced fracture within the left fibular head. 4. Distal left tibia and fibula are intact and normally aligned. Electronically Signed   By: Franki Cabot M.D.   On: 09/16/2016 18:15   Ct Knee Left Wo Contrast  Result Date: 09/16/2016 CLINICAL DATA:  Acute tibial plateau and fibular head fractures. EXAM: CT OF THE LEFT KNEE WITHOUT CONTRAST TECHNIQUE: Multidetector CT imaging of the LEFT knee was performed according to the standard protocol. Multiplanar CT image reconstructions were also generated. COMPARISON:  Radiographs from earlier on the same day of the left tibia and fibula FINDINGS: Bones/Joint/Cartilage The following fractures are noted: 1. 2 mm depressed central lateral tibial plateau fracture. 2. Fractured at the base of the tibial spine. 15 x 5 mm ossific density slightly displaced anteriorly off the base of the spine. 3. Posterolateral corner fracture of the tibial epiphysis and metaphysis. 4. Nondisplaced sagittal fracture through the cortex of the posteromedial medial tibial plateau extending distally for approximately 3 cm. 5. Nondisplaced sagittal fracture through the medial fibular head. No focal chondral defect though assessment is limited on CT. Ligaments The ACL appears somewhat lax no full-thickness tear is  apparent. The PCL appears intact. Soft tissue thickening along the course of the lateral collateral ligament cannot exclude LCL injury. Muscles and Tendons Intact extensor mechanism.  No intramuscular hematoma. Soft tissues Lipohemarthrosis. IMPRESSION: The following fractures are noted: 1. 2 mm depressed central lateral tibial plateau fracture. 2. Fractured at the base of the tibial spine. 15 x 5 mm ossific density slightly displaced anteriorly off the base of the spine. 3. Posterolateral corner fracture of the tibial epiphysis and metaphysis. 4. Nondisplaced sagittal fracture through the cortex of the posteromedial medial tibial plateau extending distally for approximately 3 cm. 5. Nondisplaced sagittal fracture through the medial fibular head. Fracture associated suprapatellar lipohemarthrosis. Lax appearing ACL with thickened appearing proximal LCL. Ligamentous injury is therefore not excluded. Electronically Signed   By: Ashley Royalty M.D.   On: 09/16/2016 20:20   Dg Knee Complete 4 Views Left  Result Date: 09/16/2016 CLINICAL DATA:  Left knee and lower leg pain after falling today. Pt states she fell while getting off the bus today and fell directly on her left knee. Denies previous injury or surgery to left knee or lower leg. EXAM: LEFT KNEE - COMPLETE 4+ VIEW COMPARISON:  None. FINDINGS: There is irregularity at the lateral aspect of the tibial plateau indicating slightly displaced plateau fracture. There is an associated slight depression of the lateral aspect of the tibial plateau. There is associated joint effusion, best seen in the suprapatellar fossa. Medial portion of the tibial plateau appears intact. There is no fracture seen with distal femur. Probable slightly displaced fracture noted within the distal fibula. IMPRESSION: 1. Slightly displaced/depressed fracture within the lateral portion of the tibial plateau. Suspect fracture extension towards the midline tibial plateau. 2. Associated joint  effusion. 3. No fracture or depression seen within the medial portion of the tibial plateau. 4. Questionable slightly displaced fracture within the left fibular head. Electronically Signed   By: Cherlynn Kaiser  Enriqueta Shutter M.D.   On: 09/16/2016 18:12    Review of Systems  Constitutional: Negative for chills, fever and weight loss.  Respiratory: Negative for shortness of breath.   Cardiovascular: Negative for chest pain.  Neurological: Negative for tingling.   Blood pressure 119/69, pulse 84, temperature 98.3 F (36.8 C), temperature source Oral, resp. rate 20, height _0  (1.651 m), weight 159 lb 3.2 oz (72.2 kg), last menstrual period 07/25/2016, SpO2 100 %. Physical Exam Vital signs remained stable as recorded above. General appearance she has a small frame she is thin in stature. She is oriented to person place and time Mood and affect flat affect with normal mood. Gait not observed but by report she cannot ambulate even with a walker and knee brace  Right and left upper extremity: Inspection no deformities palpation no tenderness. Range of motion no contractures. Stability no subluxation shoulder elbow wrist. Muscle tone normal. Skin normal. Pulses normal. Lymph nodes negative in the epitrochlear region. Sensation normal. No pathologic reflexes.  Right lower extremity normal range of motion stability strength alignment skin pulse. Popliteal fossa and groin normal for lymph node enlargement. Sensation normal. No pathologic reflexes.  Left lower extremity left knee swollen joint effusion. Firm swollen lateral anterior left knee no compartment syndrome knee at 20 flexion painful range of motion 0-20 stability not tested because of pain alignment normal muscle tone normal skin normal except over the left foot which shows what appears to be some chronic venous stasis type changes she has poor nail quality and also indicates that her nails are dying and have to be removed as several have already done but  she has a good strong pulse normal sensation no lymph nodes in the groin no pathologic reflexes.  Balance is poor by report with gait Assessment/Plan: X-rays tib-fib and knee 2 and 4 views respectively show proximal lateral tibial plateau which appears to be a depressed type III fracture  However once the CAT scan is obtained she has 2.5 mm depression centrally of the lateral plateau with a fibular head fracture and she has a sagittal split of the medial condyle extending into the joint surface with tibial spine fracture avulsions.  This is a fracture dislocation variant  The tibia and femur are not subluxated  After reviewing her situation which includes living alone at home the overall configuration of the fracture which shows no displacements of the fracture fragments and her age I recommend nonoperative treatment with a straight leg brace for the next 4 weeks followed by hinged knee bracing for an additional 8 weeks with x-rays at 48 and 12 weeks  She understands that she may need knee replacement in the future and that the fracture could displace if she puts weight on it other than toe-touch weightbearing  Recommend physical therapy and social service placement  [employer] 09/17/2016, 9:01 AM

## 2016-09-17 NOTE — Progress Notes (Signed)
IV removed. WNL. D/C instructions given to pt. Verbalized understanding.

## 2016-09-19 ENCOUNTER — Other Ambulatory Visit: Payer: Self-pay

## 2016-09-19 ENCOUNTER — Telehealth: Payer: Self-pay

## 2016-09-19 DIAGNOSIS — S8290XA Unspecified fracture of unspecified lower leg, initial encounter for closed fracture: Secondary | ICD-10-CM

## 2016-09-19 NOTE — Progress Notes (Unsigned)
dme

## 2016-09-19 NOTE — Telephone Encounter (Signed)
dme printed for signature

## 2016-09-22 ENCOUNTER — Encounter: Payer: Self-pay | Admitting: Vascular Surgery

## 2016-09-24 ENCOUNTER — Encounter: Payer: Self-pay | Admitting: Family Medicine

## 2016-09-24 ENCOUNTER — Ambulatory Visit (INDEPENDENT_AMBULATORY_CARE_PROVIDER_SITE_OTHER): Payer: Medicaid Other | Admitting: Family Medicine

## 2016-09-24 VITALS — BP 118/64 | HR 88 | Temp 98.9°F | Resp 20 | Ht 65.0 in | Wt 158.0 lb

## 2016-09-24 DIAGNOSIS — W108XXS Fall (on) (from) other stairs and steps, sequela: Secondary | ICD-10-CM | POA: Diagnosis not present

## 2016-09-24 DIAGNOSIS — S8290XA Unspecified fracture of unspecified lower leg, initial encounter for closed fracture: Secondary | ICD-10-CM

## 2016-09-24 DIAGNOSIS — R269 Unspecified abnormalities of gait and mobility: Secondary | ICD-10-CM

## 2016-09-24 MED ORDER — DOCUSATE SODIUM 100 MG PO CAPS: 100.0000 mg | ORAL_CAPSULE | Freq: Two times a day (BID) | ORAL | 0 refills | Status: DC

## 2016-09-24 MED ORDER — IBUPROFEN 800 MG PO TABS: 800.0000 mg | ORAL_TABLET | Freq: Three times a day (TID) | ORAL | 0 refills | Status: DC | PRN

## 2016-09-24 MED ORDER — OXYCODONE-ACETAMINOPHEN 5-325 MG PO TABS: 1.0000 | ORAL_TABLET | Freq: Four times a day (QID) | ORAL | 0 refills | Status: DC | PRN

## 2016-09-24 NOTE — Patient Instructions (Addendum)
Stay off of leg TOE TOUCH pressure only like I showed you Leave brace on me adjust if needed like we did today  Take the ibuprofen 3 times a day for moderate pain In addition take the oxycodone as needed severe pain Take the stool softener daily if taking the pain medicine  Take all pain medicine with food Pain medicine can cause drowsiness and constipation  Follow up with Orthopedic surgeon  Call for problems

## 2016-09-24 NOTE — Progress Notes (Signed)
Chief Complaint  Patient presents with  . Follow-up    1 week hosp.    Admit date: 09/16/2016 Discharge date: 09/17/2016  [place] getting out of transportation [place] and landed on flexed knee.She has a tibial plateau fracture of her left knee. She was seen by orthopedic surgery and placed into a knee immobilizer. She was instructed on her ambulatory status, toe-touch only, and sent home.  According to the social worker who took her home and who who accompanies her today she was sent home without a walker, without pain medication, and without any means of getting her into her upstairs apartment.   She is here today for a face-to-face evaluation in order to obtain home services, a rolling walker, bedside commode, and home physical therapy to help with ambulation and safety.  Her biggest complaint, not unexpected, is pain. It is not well managed by Aleve and Tylenol.  She is wearing her brace as instructed. She has not removed it since going home from hospital. It keeps slipping down around her right ankle and she tugs it back up.   Both of her ankles are swollen left greater than right. This predates her injury. No calf pain, chest pain and shortness of breath or cough.  She states that her appetite is poor but she is trying to eat. She is drinking water. She has not had any more falls. She has stayed in her apartment.  Patient Active Problem List   Diagnosis Date Noted  . Tibial plateau fracture, left, closed, initial encounter 09/16/2016  . Closed left fibular fracture 09/16/2016  . PVD (peripheral vascular disease) ([place]) 07/27/2016  . Chronic lymphocytic leukemia (CLL), B-cell ([place]) 04/04/2016  . Mass of right side of neck 01/21/2016  . Lymphadenopathy 01/17/2016  . Dermatomycosis 01/17/2016  . Tinea pedis 11/13/2015  . Depression with anxiety 07/07/2015  . Inflammatory arthritis 04/18/2015  . Bilateral hand pain 02/26/2015  . Toe pain, bilateral 02/26/2015  . Pain due to  onychomycosis of toenail 12/10/2014  . 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 Rheumatoid arthritis ([place]) 02/12/2012  . ADJ DISORDER WITH MIXED ANXIETY & DEPRESSED MOOD 08/01/2010  . NICOTINE ADDICTION 04/29/2010  . HAND PAIN, BILATERAL 01/29/2009  . WEIGHT LOSS 04/24/2008  . Essential hypertension 11/24/2007    Outpatient Encounter Prescriptions as of 09/24/2016  Medication Sig  . acetaminophen (TYLENOL) 325 MG tablet Take 2 tablets (650 mg total) by mouth every 6 (six) hours as needed for mild pain (or Fever >/= 101).  Marland Kitchen amLODipine (NORVASC) 10 MG tablet TAKE 1 TABLET BY MOUTH ONCE DAILY.  Marland Kitchen PARoxetine (PAXIL) 20 MG tablet TAKE 1 TABLET BY MOUTH ONCE A DAY.  Marland Kitchen Vitamin D, Ergocalciferol, (DRISDOL) 50000 units CAPS capsule TAKE 1 CAPSULE BY MOUTH EVERY 7 DAYS. (Patient taking differently: TAKE 1 CAPSULE BY MOUTH EVERY 7 DAYS ON SATURDAYS)  . docusate sodium (COLACE) 100 MG capsule Take 1 capsule (100 mg total) by mouth 2 (two) times daily.  Marland Kitchen ibuprofen (ADVIL,MOTRIN) 800 MG tablet Take 1 tablet (800 mg total) by mouth every 8 (eight) hours as needed.  Marland Kitchen oxyCODONE-acetaminophen (PERCOCET/ROXICET) 5-325 MG tablet Take 1 tablet by mouth every 6 (six) hours as needed.   No facility-administered encounter medications on file as of 09/24/2016.     Past Medical History:  Diagnosis Date  . Anxiety   . Chronic abdominal pain    resolved  . CLL (chronic lymphocytic leukemia) ([place])   . H. pylori infection 2/09   s/p prevpac  .  Hypertension   . Lymphadenopathy 01/17/2016  . Nicotine addiction   . Psychosis    social worker states that patient does not like to talk about this and will stop her meds if you try and discuss this with her.    Past Surgical History:  Procedure Laterality Date  . AXILLARY LYMPH NODE BIOPSY Right 03/28/2016   Procedure: RIGHT AXILLARY LYMPH NODE BIOPSY;  Surgeon: Aviva Signs, MD;  Location: [facility];  Service: General;  Laterality: Right;  . CHOLECYSTECTOMY    .  COLONOSCOPY  02/07/2011   SLF: Slightly tortuous colon.  Otherwise normal colon without evidence of polyps, masses, inflammatory changes, diverticular AVMs  . HAMMER TOE SURGERY Left   . HYSTEROSCOPY W/D&C N/A 07/30/2016   Procedure: DILATATION AND CURETTAGE /HYSTEROSCOPY;  Surgeon: Florian Buff, MD;  Location: [facility];  Service: Gynecology;  Laterality: N/A;  . Surgery of left middle finger on left hand (childhood )    . TUBAL LIGATION      Social History   Social History  . Marital status: Single    Spouse name: N/A  . Number of children: 2  . Years of education: N/A   Occupational History  . disabled    Social History Main Topics  . Smoking status: Current Every Day Smoker    Packs/day: 0.25    Years: 16.00    Types: Cigarettes  . Smokeless tobacco: Never Used     Comment: 5 per day   . Alcohol use No  . Drug use: No  . Sexual activity: No   Other Topics Concern  . Not on file   Social History Narrative   Lives alone    Family History  Problem Relation Age of Onset  . Cancer Mother     Review of Systems  Constitutional: Negative for chills, fever and weight loss.  HENT: Negative.   Respiratory: Negative for cough and shortness of breath.   Cardiovascular: Positive for leg swelling. Negative for chest pain.  Gastrointestinal: Negative for constipation, diarrhea, nausea and vomiting.  Genitourinary: Negative for dysuria and frequency.  Musculoskeletal: Positive for joint pain.       Pain In left leg  Skin: Negative for itching and rash.  Neurological: Negative.   Psychiatric/Behavioral:       Tearful due to lack of sleep and pain   BP 118/64 (BP Location: Right Arm, Patient Position: Sitting, Cuff Size: Large)   Pulse 88   Temp 98.9 F (37.2 C) (Oral)   Resp 20   Ht 5\' 5"  (1.651 m)   Wt 158 lb (71.7 kg)   LMP 07/25/2016   SpO2 100%   BMI 26.29 kg/m   Physical Exam  Constitutional: She appears well-developed and well-nourished.  Slow responses.  Poor eye contact  HENT:  Head: Normocephalic.  Mouth/Throat: Oropharynx is clear and moist.  Eyes: Conjunctivae are normal. Pupils are equal, round, and reactive to light.  Neck: Normal range of motion.  Cardiovascular: Normal rate, regular rhythm and normal heart sounds.   Pulmonary/Chest: Effort normal and breath sounds normal. No respiratory distress. She has no wheezes.  Abdominal: Soft. Bowel sounds are normal. She exhibits no distension.  Musculoskeletal: She exhibits edema.  Left leg in an immobilizer.  immobilizer is removed. Skin is intact underneath. Leg is moderately swollen. Immobilizer is re applied in proper position  Lymphadenopathy:    She has no cervical adenopathy.  Neurological: She is alert.  Psychiatric: She has a normal mood and affect. Her behavior  is normal.   ASSESSMENT/PLAN: I Raylene Everts certify that this patient is under my care and that I, or a nurse practitioner or physician's assistant working with me, had a face-to-face encounter that meets the physician face-to-face encounter requirements with this patient on 09/24/2016. The encounter with the patient was in whole, or in part for the following medical condition(s) which is the primary reason for home health care : Tibial plateau fracture, fall, gait instability The patient requires a rolling walker. She also needs a bedside commode. Also ordered home health for physical therapy to assist with gait training strengthening and home health safety  1. Closed fracture of lower extremity, unspecified laterality, initial encounter   2. Abnormality of gait   3. Fall (on) (from) other stairs and steps, sequela     Patient Instructions  Stay off of leg TOE TOUCH pressure only like I showed you Leave brace on me adjust if needed like we did today  Take the ibuprofen 3 times a day for moderate pain In addition take the oxycodone as needed severe pain Take the stool softener daily if taking the pain  medicine  Take all pain medicine with food Pain medicine can cause drowsiness and constipation  Follow up with Orthopedic surgeon  Call for problems   Raylene Everts, MD

## 2016-09-25 ENCOUNTER — Encounter: Payer: Medicaid Other | Admitting: Vascular Surgery

## 2016-09-25 ENCOUNTER — Ambulatory Visit (HOSPITAL_COMMUNITY): Payer: Medicaid Other

## 2016-09-29 ENCOUNTER — Ambulatory Visit (HOSPITAL_COMMUNITY): Payer: Medicaid Other | Admitting: Oncology

## 2016-09-29 DIAGNOSIS — I10 Essential (primary) hypertension: Secondary | ICD-10-CM | POA: Diagnosis not present

## 2016-09-29 DIAGNOSIS — C9110 Chronic lymphocytic leukemia of B-cell type not having achieved remission: Secondary | ICD-10-CM

## 2016-09-29 DIAGNOSIS — S82832D Other fracture of upper and lower end of left fibula, subsequent encounter for closed fracture with routine healing: Secondary | ICD-10-CM | POA: Diagnosis not present

## 2016-10-02 ENCOUNTER — Ambulatory Visit (HOSPITAL_COMMUNITY): Payer: Medicaid Other | Admitting: Oncology

## 2016-10-07 ENCOUNTER — Ambulatory Visit (INDEPENDENT_AMBULATORY_CARE_PROVIDER_SITE_OTHER): Payer: Medicaid Other | Admitting: Orthopedic Surgery

## 2016-10-07 ENCOUNTER — Ambulatory Visit (INDEPENDENT_AMBULATORY_CARE_PROVIDER_SITE_OTHER): Payer: Medicaid Other

## 2016-10-07 ENCOUNTER — Ambulatory Visit (HOSPITAL_COMMUNITY)
Admission: RE | Admit: 2016-10-07 | Discharge: 2016-10-07 | Disposition: A | Discharge status: Home / Self Care | Payer: Medicaid Other | Source: Ambulatory Visit | Attending: Orthopedic Surgery | Admitting: Orthopedic Surgery

## 2016-10-07 ENCOUNTER — Encounter: Payer: Self-pay | Admitting: Orthopedic Surgery

## 2016-10-07 VITALS — BP 134/75 | HR 107

## 2016-10-07 DIAGNOSIS — R590 Localized enlarged lymph nodes: Secondary | ICD-10-CM | POA: Insufficient documentation

## 2016-10-07 DIAGNOSIS — S82122A Displaced fracture of lateral condyle of left tibia, initial encounter for closed fracture: Secondary | ICD-10-CM

## 2016-10-07 DIAGNOSIS — I82402 Acute embolism and thrombosis of unspecified deep veins of left lower extremity: Secondary | ICD-10-CM | POA: Diagnosis present

## 2016-10-07 DIAGNOSIS — R938 Abnormal findings on diagnostic imaging of other specified body structures: Secondary | ICD-10-CM | POA: Insufficient documentation

## 2016-10-07 NOTE — Progress Notes (Signed)
Patient ID: Lottie Dawson, female   DOB: February 18, 1961, 56 y.o.   MRN: [id number]  Chief Complaint  Patient presents with  . Knee Injury    LEFT TIBIAL PLATEAU FRACTURE, DOI 09/16/16    HPI ROSARIE MATUSKA is a 56 y.o. female.  He 2 days status post tibial plateau fracture left leg. Patient appears to have some type of mental disorders here with a Education officer, museum HPI She sustained a fall was taken to the ER she had an x-ray and a CAT scan she has 2.5 mm of central depression in slight split of the lateral tibial plateau.  She denies of increased swelling since left the hospital, initially had severe lateral knee pain with swelling which was associated with throbbing aching sensation without numbness or tingling  She has been walking on the leg with a brace and a walker   Review of Systems Review of Systems  Constitutional: Negative for fever.  Respiratory: Negative for shortness of breath.   Cardiovascular: Positive for leg swelling. Negative for chest pain.  Neurological: Negative for weakness and numbness.     Past Medical History:  Diagnosis Date  . Anxiety   . Chronic abdominal pain    resolved  . CLL (chronic lymphocytic leukemia) ([place])   . H. pylori infection 2/09   s/p prevpac  . Hypertension   . Lymphadenopathy 01/17/2016  . Nicotine addiction   . Psychosis    social worker states that patient does not like to talk about this and will stop her meds if you try and discuss this with her.    Past Surgical History:  Procedure Laterality Date  . AXILLARY LYMPH NODE BIOPSY Right 03/28/2016   Procedure: RIGHT AXILLARY LYMPH NODE BIOPSY;  Surgeon: Aviva Signs, MD;  Location: [facility];  Service: General;  Laterality: Right;  . CHOLECYSTECTOMY    . COLONOSCOPY  02/07/2011   SLF: Slightly tortuous colon.  Otherwise normal colon without evidence of polyps, masses, inflammatory changes, diverticular AVMs  . HAMMER TOE SURGERY Left   . HYSTEROSCOPY W/D&C N/A 07/30/2016   Procedure:  DILATATION AND CURETTAGE /HYSTEROSCOPY;  Surgeon: Florian Buff, MD;  Location: [facility];  Service: Gynecology;  Laterality: N/A;  . Surgery of left middle finger on left hand (childhood )    . TUBAL LIGATION        Social History Social History  Substance Use Topics  . Smoking status: Current Every Day Smoker    Packs/day: 0.25    Years: 16.00    Types: Cigarettes  . Smokeless tobacco: Never Used     Comment: 5 per day   . Alcohol use No    No Known Allergies  Current Outpatient Prescriptions  Medication Sig Dispense Refill  . amLODipine (NORVASC) 10 MG tablet TAKE 1 TABLET BY MOUTH ONCE DAILY. 30 tablet 3  . docusate sodium (COLACE) 100 MG capsule Take 1 capsule (100 mg total) by mouth 2 (two) times daily. 10 capsule 0  . oxyCODONE-acetaminophen (PERCOCET/ROXICET) 5-325 MG tablet Take 1 tablet by mouth every 6 (six) hours as needed. 28 tablet 0  . PARoxetine (PAXIL) 20 MG tablet TAKE 1 TABLET BY MOUTH ONCE A DAY. 90 tablet 1  . Vitamin D, Ergocalciferol, (DRISDOL) 50000 units CAPS capsule TAKE 1 CAPSULE BY MOUTH EVERY 7 DAYS. (Patient taking differently: TAKE 1 CAPSULE BY MOUTH EVERY 7 DAYS ON SATURDAYS) 4 capsule 3  . acetaminophen (TYLENOL) 325 MG tablet Take 2 tablets (650 mg total) by mouth every 6 (  six) hours as needed for mild pain (or Fever >/= 101). (Patient not taking: Reported on 10/07/2016)    . ibuprofen (ADVIL,MOTRIN) 800 MG tablet Take 1 tablet (800 mg total) by mouth every 8 (eight) hours as needed. (Patient not taking: Reported on 10/07/2016) 30 tablet 0   No current facility-administered medications for this visit.        Physical Exam Blood pressure 134/75, pulse (!) 107, last menstrual period 07/25/2016. RR 18 Physical Exam  Constitutional: She appears well-developed and well-nourished. No distress.  Overall mood is normal affect is flat. Appearance otherwise well-groomed no gross abnormalities  HENT:  Head: Normocephalic and atraumatic.  Cardiovascular:  Intact distal pulses.   Bilateral peripheral edema left worse than right seems to be exacerbated by fracture on the left also chronic on the right no ulcers  Musculoskeletal:  Left knee stable varus valgus stress and anterior posterior stress test left knee swollen as his left leg compartments are soft Is supple Homans sign is negative knee flexion is 10-85 Extensor mechanism strength is intact  Again the right leg shows swelling and pitting edema in the lower extremity with skin changes consistent with venous stasis disease. There is no tenderness in the knee joint as recorded stability tests are normal strength is intact skin changes as noted pulses are good distally    Skin: She is not diaphoretic. Pallor: .DX.    Data Reviewed  independent image interpretation :  For x-rays of the knee show lateral tibial plateau fracture CT scan shows lateral tibial plateau fracture with 2-3 mm of depression of the lateral plateau tibial spine fracture and medial condyle fracture making this a split depressed bicondylar variant  Repeat x-rays today show no change in the amount of depression of the tibial plateau fracture  Assessment    Encounter Diagnosis  Name Primary?  . Closed fracture of lateral portion of left tibial plateau, initial encounter Yes     Plan   Ultrasound rule out DVT Recommend hinged knee brace protected weightbearing follow-up x-ray in 3 weeks  Arther Abbott, MD 10/07/2016 9:18 AM

## 2016-10-07 NOTE — Patient Instructions (Signed)
Continue bracing at all times when ambulating use walker all times with ambulating  You will be sent for an ultrasound of your left leg to rule out blood clot

## 2016-10-07 NOTE — Addendum Note (Signed)
Addended by: Baldomero Lamy B on: 10/07/2016 09:59 AM   Modules accepted: Orders

## 2016-10-13 ENCOUNTER — Ambulatory Visit: Payer: Medicaid Other | Admitting: Orthopedic Surgery

## 2016-10-22 ENCOUNTER — Ambulatory Visit (HOSPITAL_COMMUNITY): Payer: Medicaid Other | Admitting: Oncology

## 2016-10-23 ENCOUNTER — Encounter: Payer: Self-pay | Admitting: Family Medicine

## 2016-10-23 ENCOUNTER — Ambulatory Visit (INDEPENDENT_AMBULATORY_CARE_PROVIDER_SITE_OTHER): Payer: Medicaid Other | Admitting: Family Medicine

## 2016-10-23 VITALS — BP 118/80 | HR 106 | Resp 15 | Ht 65.0 in | Wt 152.1 lb

## 2016-10-23 DIAGNOSIS — F418 Other specified anxiety disorders: Secondary | ICD-10-CM | POA: Diagnosis not present

## 2016-10-23 DIAGNOSIS — F17200 Nicotine dependence, unspecified, uncomplicated: Secondary | ICD-10-CM

## 2016-10-23 DIAGNOSIS — I10 Essential (primary) hypertension: Secondary | ICD-10-CM | POA: Diagnosis not present

## 2016-10-23 MED ORDER — IBUPROFEN 600 MG PO TABS: ORAL_TABLET | ORAL | 3 refills | Status: DC

## 2016-10-23 NOTE — Patient Instructions (Addendum)
F/u in 2.5 month, call if you need me sooner  Need in home assistance for 1 month, to help with personal care and meals, after this , back to the club house.  Medication prescribed twice daily for pain for 2 weeks  Continue to cut back on cigs , now 3 per day   Hope you continue to feel better

## 2016-10-24 NOTE — Assessment & Plan Note (Signed)
Controlled, no change in medication  

## 2016-10-24 NOTE — Assessment & Plan Note (Signed)

## 2016-10-24 NOTE — Assessment & Plan Note (Addendum)
Being followed by oncology, has upcoming appt with repeat imaging planned

## 2016-10-24 NOTE — Assessment & Plan Note (Addendum)
Managed by ortho, ibuprofen prescribed for ongoing pain management, needs assistance with ADL's for next 4 weeks, case worker will continue to work on this

## 2016-10-24 NOTE — Progress Notes (Signed)
   [name]     MRN: [id number]      DOB: 07/15/1961   HPI Ms. Lacosse is here for follow up and re-evaluation of chronic medical conditions, medication management and review of any available recent lab and radiology data.  Preventive health is updated, specifically  Cancer screening and Immunization.   Still wearing boot and unable to weight bear and care for herself following fracture of fibula, unfortunately , her case worker has still been unable to get someone to assist her with ADL's as she heals. [place] states clearly that she needs help, she is incapable of bathing herself safely and bathroom has no grab bars Interested in and looking forward to returning to the club house  Reportedly had a very stressful time immediately following the fall, for approx 10 to 14 days , her caseworker was unable to interact positively and in a helpful way, with her  ROS Denies recent fever or chills. Denies sinus pressure, nasal congestion, ear pain or sore throat. Denies chest congestion, productive cough or wheezing. Denies chest pains, palpitations and leg swelling Denies abdominal pain, nausea, vomiting,diarrhea or constipation.   Denies dysuria, frequency, hesitancy or incontinence.  Denies headaches, seizures, numbness, or tingling. Denies uncontrolled  depression, anxiety or insomnia. Denies skin break down or rash.   PE  BP 118/80   Pulse (!) 106   Resp 15   Ht 5\' 5"  (1.651 m)   Wt 152 lb 1.9 oz (69 kg)   LMP 07/25/2016   SpO2 97%   BMI 25.31 kg/m   Patient alert and oriented and in no cardiopulmonary distress.  HEENT: No facial asymmetry, EOMI,   oropharynx pink and moist.  Neck supple no JVD, no mass.  Chest: Clear to auscultation bilaterally.decreased air entry   CVS: S1, S2 no murmurs, no S3.Regular rate.  ABD: Soft non tender.   Ext: No edema  MS: Decreased  ROM lumbar  Spine, and left lower extremity immobilized currently, normal in  shoulders, .  Skin: Intact, no  ulcerations or rash noted.  Psych: Good eye contact, normal affect.  not anxious or depressed appearing.  CNS: CN 2-12 intact, power,  normal throughout.no focal deficits noted.   Assessment & Plan  Essential hypertension Controlled, no change in medication   Depression with anxiety Controlled, no change in medication   Closed left fibular fracture Managed by ortho, ibuprofen prescribed for ongoing pain management, needs assistance with ADL's for next 4 weeks, case worker will continue to work on this  Chronic lymphocytic leukemia (CLL), B-cell ([place]) Being followed by oncology, has upcoming appt with repeat imaging planned  NICOTINE ADDICTION Patient counseled for approximately 5 minutes regarding the health risks of ongoing nicotine use, specifically all types of cancer, heart disease, stroke and respiratory failure. The options available for help with cessation ,the behavioral changes to assist the process, and the option to either gradully reduce usage  Or abruptly stop.is also discussed. Pt is also encouraged to set specific goals in number of cigarettes used daily, as well as to set a quit date.

## 2016-11-03 ENCOUNTER — Ambulatory Visit (HOSPITAL_COMMUNITY)
Admission: RE | Admit: 2016-11-03 | Discharge: 2016-11-03 | Disposition: A | Discharge status: Home / Self Care | Payer: Medicaid Other | Source: Ambulatory Visit | Attending: Orthopedic Surgery | Admitting: Orthopedic Surgery

## 2016-11-03 ENCOUNTER — Ambulatory Visit (INDEPENDENT_AMBULATORY_CARE_PROVIDER_SITE_OTHER): Payer: Medicaid Other | Admitting: Orthopedic Surgery

## 2016-11-03 DIAGNOSIS — S82142D Displaced bicondylar fracture of left tibia, subsequent encounter for closed fracture with routine healing: Secondary | ICD-10-CM | POA: Diagnosis not present

## 2016-11-03 DIAGNOSIS — M85862 Other specified disorders of bone density and structure, left lower leg: Secondary | ICD-10-CM | POA: Insufficient documentation

## 2016-11-03 DIAGNOSIS — X58XXXD Exposure to other specified factors, subsequent encounter: Secondary | ICD-10-CM | POA: Diagnosis not present

## 2016-11-03 DIAGNOSIS — S82832D Other fracture of upper and lower end of left fibula, subsequent encounter for closed fracture with routine healing: Secondary | ICD-10-CM | POA: Diagnosis not present

## 2016-11-03 NOTE — Progress Notes (Signed)
FOLLOW UP VISIT   Patient ID: [name], female   DOB: 1960-12-29, 56 y.o.   MRN: [id number]  No chief complaint on file.   HPI [name] is a 56 y.o. female.   HPI  Fracture care follow-up  Status post tibial plateau fracture left lower extremity  Caudal tilt and lateral x-rays today show no further displacement or depression of the bicondylar fracture    Review of Systems Review of Systems     Physical Exam  Tenderness around the joint limb alignment looks fairly normal   MEDICAL DECISION MAKING  DATA   X-rays done at the hospital. She has a bicondylar tibial plateau fracture with some lateral depression and stable she has callus forming around the fracture  DIAGNOSIS  Encounter Diagnosis  Name Primary?  . Closed fracture of left tibial plateau with routine healing, subsequent encounter Yes     PLAN(RISK)    Continue bracing and walker for 6 weeks

## 2016-11-03 NOTE — Patient Instructions (Signed)
BRACE AND WALKER 6 MORE WEEKS

## 2016-11-04 ENCOUNTER — Ambulatory Visit: Payer: Medicaid Other | Admitting: Orthopedic Surgery

## 2016-11-13 ENCOUNTER — Telehealth: Payer: Self-pay | Admitting: Vascular Surgery

## 2016-11-13 ENCOUNTER — Encounter: Payer: Self-pay | Admitting: Vascular Surgery

## 2016-11-13 NOTE — Telephone Encounter (Signed)
Cxled lab. Lm on hm# and [name], pt's social woker's # to inform them of appt change.

## 2016-11-13 NOTE — Telephone Encounter (Signed)
-----   Message from Orlene Plum sent at 11/13/2016  9:11 AM EST ----- Regarding: VENOUS ULTRASOUND Medicaid denied because patient had a study in January (looks like it was done at [facility]).  No reason was given for the study to be repeated.  Thanks, [employer]

## 2016-11-14 ENCOUNTER — Encounter (HOSPITAL_COMMUNITY): Payer: Self-pay | Admitting: Oncology

## 2016-11-14 ENCOUNTER — Encounter (HOSPITAL_COMMUNITY): Payer: Medicaid Other | Attending: Hematology & Oncology | Admitting: Oncology

## 2016-11-14 ENCOUNTER — Encounter (HOSPITAL_COMMUNITY): Payer: Medicaid Other

## 2016-11-14 VITALS — BP 138/64 | HR 94 | Temp 98.7°F | Resp 18 | Ht 65.0 in | Wt 142.0 lb

## 2016-11-14 DIAGNOSIS — C9110 Chronic lymphocytic leukemia of B-cell type not having achieved remission: Secondary | ICD-10-CM | POA: Diagnosis present

## 2016-11-14 DIAGNOSIS — R5383 Other fatigue: Secondary | ICD-10-CM | POA: Diagnosis not present

## 2016-11-14 DIAGNOSIS — D649 Anemia, unspecified: Secondary | ICD-10-CM | POA: Diagnosis not present

## 2016-11-14 LAB — CBC WITH DIFFERENTIAL/PLATELET
Basophils Absolute: 0 10*3/uL (ref 0.0–0.1)
Basophils Relative: 0 %
Eosinophils Absolute: 0.1 10*3/uL (ref 0.0–0.7)
Eosinophils Relative: 1 %
HCT: 33.8 % — ABNORMAL LOW (ref 36.0–46.0)
HEMOGLOBIN: 11.3 g/dL — AB (ref 12.0–15.0)
LYMPHS ABS: 4.5 10*3/uL — AB (ref 0.7–4.0)
LYMPHS PCT: 60 %
MCH: 29.3 pg (ref 26.0–34.0)
MCHC: 33.4 g/dL (ref 30.0–36.0)
MCV: 87.6 fL (ref 78.0–100.0)
Monocytes Absolute: 0.5 10*3/uL (ref 0.1–1.0)
Monocytes Relative: 6 %
NEUTROS ABS: 2.4 10*3/uL (ref 1.7–7.7)
NEUTROS PCT: 33 %
Platelets: 256 10*3/uL (ref 150–400)
RBC: 3.86 MIL/uL — AB (ref 3.87–5.11)
RDW: 15.4 % (ref 11.5–15.5)
WBC: 7.4 10*3/uL (ref 4.0–10.5)

## 2016-11-14 LAB — IRON AND TIBC
Iron: 26 ug/dL — ABNORMAL LOW (ref 28–170)
SATURATION RATIOS: 7 % — AB (ref 10.4–31.8)
TIBC: 386 ug/dL (ref 250–450)
UIBC: 360 ug/dL

## 2016-11-14 LAB — COMPREHENSIVE METABOLIC PANEL
ALBUMIN: 3.5 g/dL (ref 3.5–5.0)
ALT: 13 U/L — ABNORMAL LOW (ref 14–54)
AST: 21 U/L (ref 15–41)
Alkaline Phosphatase: 173 U/L — ABNORMAL HIGH (ref 38–126)
Anion gap: 8 (ref 5–15)
BUN: 19 mg/dL (ref 6–20)
CHLORIDE: 109 mmol/L (ref 101–111)
CO2: 25 mmol/L (ref 22–32)
Calcium: 9.2 mg/dL (ref 8.9–10.3)
Creatinine, Ser: 0.73 mg/dL (ref 0.44–1.00)
Glucose, Bld: 113 mg/dL — ABNORMAL HIGH (ref 65–99)
POTASSIUM: 3.4 mmol/L — AB (ref 3.5–5.1)
SODIUM: 142 mmol/L (ref 135–145)
Total Bilirubin: 0.2 mg/dL — ABNORMAL LOW (ref 0.3–1.2)
Total Protein: 7.6 g/dL (ref 6.5–8.1)

## 2016-11-14 LAB — VITAMIN B12: Vitamin B-12: 369 pg/mL (ref 180–914)

## 2016-11-14 LAB — FERRITIN: FERRITIN: 61 ng/mL (ref 11–307)

## 2016-11-14 LAB — LACTATE DEHYDROGENASE: LDH: 170 U/L (ref 98–192)

## 2016-11-14 LAB — FOLATE: Folate: 7.5 ng/mL (ref >5.9)

## 2016-11-14 NOTE — Assessment & Plan Note (Addendum)
CLL/SLL. She principally has LAD which is stable, WBC count and other blood counts are maintained. She has no B symptoms.  Labs today: CBC diff, CMET, LDH, anemia panel.  I personally reviewed and went over laboratory results with the patient.  The results are noted within this dictation.  With her anemia and her fatigue, it is worthwhile to perform an anemia panel.  Labs in 3 months: CBC diff, CMET, LDH.  I personally reviewed and went over radiographic studies with the patient.  The results are noted within this dictation.  CT abd/pelvis in October 2017 demonstrated stable lymphadenopathy.  Return in 3 months for follow-up.

## 2016-11-14 NOTE — Patient Instructions (Addendum)
[place] at [facility] Discharge Instructions  RECOMMENDATIONS MADE BY THE CONSULTANT AND ANY TEST RESULTS WILL BE SENT TO YOUR REFERRING PHYSICIAN.  You were seen today by Kirby Crigler PA-C. Labs today, we will call you with results. Labs and follow up in 3 months.   Thank you for choosing [place] at [facility] to provide your oncology and hematology care.  To afford each patient quality time with our provider, please arrive at least 15 minutes before your scheduled appointment time.    If you have a lab appointment with the [facility] please come in thru the  Main Entrance and check in at the main information desk  You need to re-schedule your appointment should you arrive 10 or more minutes late.  We strive to give you quality time with our providers, and arriving late affects you and other patients whose appointments are after yours.  Also, if you no show three or more times for appointments you may be dismissed from the clinic at the providers discretion.     Again, thank you for choosing [facility].  Our hope is that these requests will decrease the amount of time that you wait before being seen by our physicians.       _____________________________________________________________  Should you have questions after your visit to [facility], please contact our office at [phone] between the hours of 8:30 a.m. and 4:30 p.m.  Voicemails left after 4:30 p.m. will not be returned until the following business day.  For prescription refill requests, have your pharmacy contact our office.       Resources For Cancer Patients and their Caregivers ? American Cancer Society: Can assist with transportation, wigs, general needs, runs Look Good Feel Better.        (870) 666-6136 ? Cancer Care: Provides financial assistance, online support groups, medication/co-pay assistance.  1-800-813-HOPE (443)362-9361) ? [employer] Assists [place] cancer patients and their families through emotional , educational and financial support.  587 249 2798 ? [place] DSS Where to apply for food stamps, Medicaid and utility assistance. 5618728138 ? [facility]: Transportation to medical appointments. (650)516-6284 ? Social Security Administration: May apply for disability if have a Stage IV cancer. 4064420790 918-723-6515 ? [employer], Disability and Transit Services: Assists with nutrition, care and transit needs. [place] Support Programs: @10RELATIVEDAYS @ > Cancer Support Group  2nd Tuesday of the month 1pm-2pm, Journey Room  > Creative Journey  3rd Tuesday of the month 1130am-1pm, Journey Room  > Look Good Feel Better  1st Wednesday of the month 10am-12 noon, Journey Room (Call [place] to register [phone])

## 2016-11-14 NOTE — Progress Notes (Signed)
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  INTERVAL HISTORY: [name] 56 y.o. female returns for followup of CLL/SLL. She principally has LAD which is stable, WBC count and other blood counts are maintained. She has no B symptoms.  She is doing well from a hematologic standpoint.  I reviewed the symptoms with her and she really denies any of these.  Her weight is down a little bit but she notes a fair appetite.  She denies any fevers or chills or drenching night sweats.  She does note an increase in her fatigue compared to Christmas time 2017.  Her abdominal lymphadenopathy is stable without any changes according to recent imaging studies.  She has a tibial plateau fracture is currently in a brace.  This is being managed and followed by Dr. Aline Brochure.  Review of Systems  Constitutional: Positive for malaise/fatigue and weight loss. Negative for chills and fever.  HENT: Negative.   Eyes: Negative.   Respiratory: Negative.  Negative for cough.   Cardiovascular: Negative.  Negative for chest pain.  Gastrointestinal: Negative.  Negative for blood in stool, constipation, diarrhea, melena, nausea and vomiting.  Genitourinary: Negative.   Musculoskeletal: Negative.   Skin: Negative.   Neurological: Negative.  Negative for weakness.  Endo/Heme/Allergies: Negative.   Psychiatric/Behavioral: Positive for depression.    Past Medical History:  Diagnosis Date    . Anxiety   . Chronic abdominal pain    resolved  . Chronic lymphocytic leukemia (CLL), B-cell ([place]) 04/04/2016  . CLL (chronic lymphocytic leukemia) ([name])   . H. pylori infection 2/09   s/p prevpac  . Hypertension   . Lymphadenopathy 01/17/2016  . Nicotine addiction   . Psychosis    social worker states that patient does not like to talk about this and will stop her meds if you try and discuss this with her.    Past Surgical History:  Procedure Laterality Date  . AXILLARY LYMPH NODE BIOPSY Right 03/28/2016   Procedure: RIGHT AXILLARY LYMPH NODE BIOPSY;  Surgeon: Aviva Signs, MD;  Location: [facility];  Service: General;  Laterality: Right;  . CHOLECYSTECTOMY    . COLONOSCOPY  02/07/2011   SLF: Slightly tortuous colon.  Otherwise normal colon without evidence of polyps, masses, inflammatory changes, diverticular AVMs  . HAMMER TOE SURGERY Left   . HYSTEROSCOPY W/D&C N/A 07/30/2016   Procedure: DILATATION AND CURETTAGE /HYSTEROSCOPY;  Surgeon: Florian Buff, MD;  Location: [facility];  Service: Gynecology;  Laterality: N/A;  . Surgery of left middle finger on left hand (childhood )    . TUBAL LIGATION      Family History  Problem Relation Age of Onset  . Cancer Mother     Social History   Social History  . Marital status: Single    Spouse name: N/A  . Number of children: 2  . Years of education: N/A   Occupational History  . disabled    Social History Main Topics  . Smoking status: Current Every Day Smoker    Packs/day: 0.25    Years: 16.00  Types: Cigarettes  . Smokeless tobacco: Never Used     Comment: 5 per day   . Alcohol use No  . Drug use: No  . Sexual activity: No   Other Topics Concern  . None   Social History Narrative   Lives alone     PHYSICAL EXAMINATION  ECOG PERFORMANCE STATUS: 1 - Symptomatic but completely ambulatory  Vitals:   11/14/16 1455  BP: 138/64  Pulse: 94  Resp: 18  Temp: 98.7 F (37.1 C)    GENERAL:alert, no distress, well  nourished, well developed, comfortable, cooperative, smiling and unaccompanied SKIN: skin color, texture, turgor are normal, no rashes or significant lesions HEAD: Normocephalic, No masses, lesions, tenderness or abnormalities EYES: normal, EOMI, Conjunctiva are pink and non-injected EARS: External ears normal OROPHARYNX:lips, buccal mucosa, and tongue normal and mucous membranes are moist  NECK: supple, trachea midline, right supraclavicular lymphadenopathy that is mobile measuring 1 cm in size with smaller palpable lymph nodes on the right that are smaller than 1 cm.  Left supraclavicular lymphadenopathy less than 1 cm. LYMPH:  As above BREAST:not examined LUNGS: clear to auscultation  HEART: regular rate & rhythm ABDOMEN:abdomen soft and normal bowel sounds BACK: Back symmetric, no curvature. EXTREMITIES:less then 2 second capillary refill, no joint deformities, effusion, or inflammation, no skin discoloration, no cyanosis.  Left knee brace NEURO: alert & oriented x 3 with fluent speech, no focal motor/sensory deficits   LABORATORY DATA: CBC    Component Value Date/Time   WBC 9.8 09/17/2016 0459   RBC 3.35 (L) 09/17/2016 0459   HGB 10.4 (L) 09/17/2016 0459   HCT 30.8 (L) 09/17/2016 0459   PLT 231 09/17/2016 0459   MCV 91.9 09/17/2016 0459   MCH 31.0 09/17/2016 0459   MCHC 33.8 09/17/2016 0459   RDW 15.4 09/17/2016 0459   LYMPHSABS 5.0 (H) 09/16/2016 1851   MONOABS 0.8 09/16/2016 1851   EOSABS 0.1 09/16/2016 1851   BASOSABS 0.0 09/16/2016 1851      Chemistry      Component Value Date/Time   NA 135 09/17/2016 0459   K 3.8 09/17/2016 0459   CL 104 09/17/2016 0459   CO2 25 09/17/2016 0459   BUN 15 09/17/2016 0459   CREATININE 0.64 09/17/2016 0459   CREATININE 0.57 07/23/2016 1512      Component Value Date/Time   CALCIUM 8.8 (L) 09/17/2016 0459   ALKPHOS 157 (H) 09/02/2016 1015   AST 24 09/02/2016 1015   ALT 14 09/02/2016 1015   BILITOT 0.4 09/02/2016 1015         PENDING LABS:   RADIOGRAPHIC STUDIES:  Dg Knee 1-2 Views Left  Result Date: 11/03/2016 CLINICAL DATA:  Tibial plateau fracture on the left. Subsequent encounter EXAM: LEFT KNEE - 1-2 VIEW COMPARISON:  10/07/2016 FINDINGS: Comminuted proximal tibia involving the lateral plateau and tibial spine. Lateral plateau depression is similar to prior when allowing for differences in projection. Nondisplaced fibular head neck fracture. No joint effusion. Progressive disuse osteopenia. IMPRESSION: 1. Lateral tibial plateau fracture. No evidence of progressive depression. 2. Nondisplaced proximal fibula fracture. 3. Callus and fracture blurring consistent with interval healing. 4. Progressive disuse osteopenia. Electronically Signed   By: Monte Fantasia M.D.   On: 11/03/2016 17:12     PATHOLOGY:    ASSESSMENT AND PLAN:  Chronic lymphocytic leukemia (CLL), B-cell ([facility]) CLL/SLL. She principally has LAD which is stable, WBC count and other blood counts are maintained. She has no B symptoms.  Labs today: CBC diff, CMET,  LDH, anemia panel.  I personally reviewed and went over laboratory results with the patient.  The results are noted within this dictation.  With her anemia and her fatigue, it is worthwhile to perform an anemia panel.  Labs in 3 months: CBC diff, CMET, LDH.  I personally reviewed and went over radiographic studies with the patient.  The results are noted within this dictation.  CT abd/pelvis in October 2017 demonstrated stable lymphadenopathy.  Return in 3 months for follow-up.   ORDERS PLACED FOR THIS ENCOUNTER: Orders Placed This Encounter  Procedures  . CBC with Differential  . Comprehensive metabolic panel  . Lactate dehydrogenase  . CBC with Differential  . Comprehensive metabolic panel  . Lactate dehydrogenase  . Vitamin B12  . Folate  . Iron and TIBC  . Ferritin    MEDICATIONS PRESCRIBED THIS ENCOUNTER: No orders of the defined types were placed in this  encounter.   THERAPY PLAN:  NCCN guidelines for indication for treatment of CLL are:  A. Eligible for clinical trial  B. Significant disease-related symptoms   1. Fatigue (severe)   2. Night sweats   3. Weight loss   4. Fever without infection  C. Threatened end-organ function  D. Progressive bulky disease (spleen >6cm below costal margin, lymph nodes >10 cm)  E. Progressive anemia  F. Progressive thrombocytopenia.  All questions were answered. The patient knows to call the clinic with any problems, questions or concerns. We can certainly see the patient much sooner if necessary.  Patient and plan discussed with Dr. Twana First and she is in agreement with the aforementioned.   This note is electronically signed by: Doy Mince 11/14/2016 3:47 PM

## 2016-11-17 ENCOUNTER — Other Ambulatory Visit (HOSPITAL_COMMUNITY): Payer: Self-pay | Admitting: Oncology

## 2016-11-17 ENCOUNTER — Encounter (HOSPITAL_COMMUNITY): Payer: Self-pay | Admitting: Oncology

## 2016-11-17 DIAGNOSIS — E611 Iron deficiency: Secondary | ICD-10-CM

## 2016-11-17 HISTORY — DX: Iron deficiency: E61.1

## 2016-11-20 ENCOUNTER — Encounter: Payer: Self-pay | Admitting: Vascular Surgery

## 2016-11-20 ENCOUNTER — Encounter (HOSPITAL_COMMUNITY): Payer: Medicaid Other

## 2016-11-20 ENCOUNTER — Ambulatory Visit (INDEPENDENT_AMBULATORY_CARE_PROVIDER_SITE_OTHER): Payer: Medicaid Other | Admitting: Vascular Surgery

## 2016-11-20 VITALS — BP 154/101 | HR 79 | Temp 99.7°F | Resp 16 | Ht 65.0 in | Wt 150.0 lb

## 2016-11-20 DIAGNOSIS — I890 Lymphedema, not elsewhere classified: Secondary | ICD-10-CM

## 2016-11-20 NOTE — Progress Notes (Signed)
Referring Physician: Dr Moshe Cipro  Patient name: [name] MRN: [id number] DOB: 11-28-1960 Sex: female  REASON FOR CONSULT: leg swelling HPI: [name] is a 56 y.o. female, with a one-year history of lower extremity swelling. Both legs are equal. The patient has a history of chronic lymphocytic leukemia. She has known matted lymph nodes in both groins. She denies prior history of DVT. She has not worn compression stockings in the past. She had a venous duplex at [facility] 10/07/2016 which showed no evidence of DVT. This again confirmed significant inguinal lymphadenopathy Other medical problems include anxiety, hypertension, psychosis all of which are currently stable.  Past Medical History:  Diagnosis Date  . Anxiety   . Chronic abdominal pain    resolved  . Chronic lymphocytic leukemia (CLL), B-cell ([place]) 04/04/2016  . CLL (chronic lymphocytic leukemia) ([place])   . H. pylori infection 2/09   s/p prevpac  . Hypertension   . Iron deficiency 11/17/2016  . Lymphadenopathy 01/17/2016  . Nicotine addiction   . Psychosis    social worker states that patient does not like to talk about this and will stop her meds if you try and discuss this with her.   Past Surgical History:  Procedure Laterality Date  . AXILLARY LYMPH NODE BIOPSY Right 03/28/2016   Procedure: RIGHT AXILLARY LYMPH NODE BIOPSY;  Surgeon: Aviva Signs, MD;  Location: [facility];  Service: General;  Laterality: Right;  . CHOLECYSTECTOMY    . COLONOSCOPY  02/07/2011   SLF: Slightly tortuous colon.  Otherwise normal colon without evidence of polyps, masses, inflammatory changes, diverticular AVMs  . HAMMER TOE SURGERY Left   . HYSTEROSCOPY W/D&C N/A 07/30/2016   Procedure: DILATATION AND CURETTAGE /HYSTEROSCOPY;  Surgeon: Florian Buff, MD;  Location: [facility];  Service: Gynecology;  Laterality: N/A;  . Surgery of left middle finger on left hand (childhood )    . TUBAL LIGATION      Family History  Problem Relation Age of Onset    . Cancer Mother     SOCIAL HISTORY: Social History   Social History  . Marital status: Single    Spouse name: N/A  . Number of children: 2  . Years of education: N/A   Occupational History  . disabled    Social History Main Topics  . Smoking status: Light Tobacco Smoker    Packs/day: 0.25    Years: 16.00    Types: Cigarettes  . Smokeless tobacco: Never Used     Comment: 5 per day   . Alcohol use No  . Drug use: No  . Sexual activity: No   Other Topics Concern  . Not on file   Social History Narrative   Lives alone    No Known Allergies  Current Outpatient Prescriptions  Medication Sig Dispense Refill  . amLODipine (NORVASC) 10 MG tablet TAKE 1 TABLET BY MOUTH ONCE DAILY. 30 tablet 3  . ibuprofen (ADVIL,MOTRIN) 600 MG tablet One tablet twice daily for 15 days 30 tablet 3  . Vitamin D, Ergocalciferol, (DRISDOL) 50000 units CAPS capsule TAKE 1 CAPSULE BY MOUTH EVERY 7 DAYS. (Patient taking differently: TAKE 1 CAPSULE BY MOUTH EVERY 7 DAYS ON SATURDAYS) 4 capsule 3  . PARoxetine (PAXIL) 20 MG tablet TAKE 1 TABLET BY MOUTH ONCE A DAY. (Patient not taking: Reported on 11/20/2016) 90 tablet 1   No current facility-administered medications for this visit.     ROS:   General:  No weight loss, Fever, chills  HEENT: No recent headaches, no nasal bleeding, no visual changes, no sore throat  Neurologic: No dizziness, blackouts, seizures. No recent symptoms of stroke or mini- stroke. No recent episodes of slurred speech, or temporary blindness.  Cardiac: No recent episodes of chest pain/pressure, no shortness of breath at rest.  No shortness of breath with exertion.  Denies history of atrial fibrillation or irregular heartbeat  Vascular: No history of rest pain in feet.  No history of claudication.  No history of non-healing ulcer, No history of DVT   Pulmonary: No home oxygen, no productive cough, no hemoptysis,  No asthma or wheezing  Musculoskeletal:  [ ]  Arthritis, [  ] Low back pain,  [ ]  Joint pain  Hematologic:No history of hypercoagulable state.  No history of easy bleeding.  No history of anemia  Gastrointestinal: No hematochezia or melena,  No gastroesophageal reflux, no trouble swallowing  Urinary: [ ]  chronic Kidney disease, [ ]  on HD - [ ]  MWF or [ ]  TTHS, [ ]  Burning with urination, [ ]  Frequent urination, [ ]  Difficulty urinating;   Skin: No rashes  Psychological: No history of anxiety,  No history of depression   Physical Examination  Vitals:   11/20/16 1428  BP: (!) 154/101  Pulse: 79  Resp: 16  Temp: 99.7 F (37.6 C)  TempSrc: Oral  SpO2: 100%  Weight: 150 lb (68 kg)  Height: 5\' 5"  (1.651 m)    Body mass index is 24.96 kg/m.  General:  Alert and oriented, no acute distress HEENT: Normal Neck: No bruit or JVD Pulmonary: Clear to auscultation bilaterally Cardiac: Regular Rate and Rhythm  Abdomen: Soft, non-tender, non-distended, Multiple nodules bilateral groins Skin: No rash, feet with thickened calluses and nails with some dried skin  Extremity Pulses:  2+ radial, brachial, femoral, dorsalis pedis, posterior tibial pulses bilaterally Musculoskeletal: No deformity or thickened not really pitting edema need of foot bilaterally  Neurologic: Upper and lower extremity motor 5/5 and symmetric  DATA: Venous duplex reviewed as per history of present illness. CT scan of the abdomen and pelvis reviewed which shows the large clusters of inguinal lymph nodes periaortic nodes and pelvic lymph nodes bilaterally. This is from a CT scan October 2017.   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 She will follow-up on as-needed basis.   Ruta Hinds, MD [facility] Office: [phone] Pager: [phone]

## 2016-12-01 ENCOUNTER — Encounter (HOSPITAL_BASED_OUTPATIENT_CLINIC_OR_DEPARTMENT_OTHER): Payer: Medicaid Other

## 2016-12-01 VITALS — BP 146/77 | HR 87 | Temp 99.7°F | Resp 16

## 2016-12-01 DIAGNOSIS — E611 Iron deficiency: Secondary | ICD-10-CM

## 2016-12-01 DIAGNOSIS — T7840XA Allergy, unspecified, initial encounter: Secondary | ICD-10-CM

## 2016-12-01 MED ORDER — METHYLPREDNISOLONE SODIUM SUCC 125 MG IJ SOLR
125.0000 mg | Freq: Once | INTRAMUSCULAR | Status: AC
  Administered 2016-12-01: 125 mg via INTRAVENOUS

## 2016-12-01 MED ORDER — SODIUM CHLORIDE 0.9 % IV SOLN
Freq: Once | INTRAVENOUS | Status: AC
  Administered 2016-12-01: 14:00:00 via INTRAVENOUS

## 2016-12-01 MED ORDER — METHYLPREDNISOLONE SODIUM SUCC 125 MG IJ SOLR
INTRAMUSCULAR | Status: AC
  Filled 2016-12-01: qty 2

## 2016-12-01 MED ORDER — DIPHENHYDRAMINE HCL 50 MG/ML IJ SOLN
INTRAMUSCULAR | Status: AC
  Filled 2016-12-01: qty 1

## 2016-12-01 MED ORDER — SODIUM CHLORIDE 0.9 % IV SOLN
510.0000 mg | Freq: Once | INTRAVENOUS | Status: AC
  Administered 2016-12-01: 510 mg via INTRAVENOUS
  Filled 2016-12-01: qty 17

## 2016-12-01 MED ORDER — DIPHENHYDRAMINE HCL 50 MG/ML IJ SOLN
50.0000 mg | Freq: Once | INTRAMUSCULAR | Status: AC
  Administered 2016-12-01: 50 mg via INTRAVENOUS

## 2016-12-01 NOTE — Progress Notes (Signed)
1426 remained in room with patient at start of feraheme infusion. Within minutes patient states "I don't know what is wrong but I am starting to feel funny". Patient then states "I feel like I can't swallow". Feraheme stopped immediately. Patient looks frightened but not in distress. Oxygen per [place] at 2L/min. G.Dawson,NP summoned to room. Patient evaluated. Hypersensitivity medications administered as ordered. Patient reports relief within a few minutes.  Above discussed with patient's caregiver. Per G.Dawson,NP, we will not rechallenge with iron infusion presently. Verbalized understanding. Patient ambulatory and stable on discharge home with caregiver.

## 2016-12-01 NOTE — Patient Instructions (Addendum)
[place] at [facility] Discharge Instructions  RECOMMENDATIONS MADE BY THE CONSULTANT AND ANY TEST RESULTS WILL BE SENT TO YOUR REFERRING PHYSICIAN.  Attempted to administer FERAHEME (ferumoxytol) infusion today. Hypersensitivity reaction reported within minutes of start of infusion. Feraheme NOT ADMINISTERED. You are now to report an allergy to feraheme (ferumoxytol). We will not rechallenge you with iron at this time. Return as scheduled for lab work and MD appointment.  Thank you for choosing [place] at [facility] to provide your oncology and hematology care.  To afford each patient quality time with our provider, please arrive at least 15 minutes before your scheduled appointment time.    If you have a lab appointment with the [facility] please come in thru the  Main Entrance and check in at the main information desk  You need to re-schedule your appointment should you arrive 10 or more minutes late.  We strive to give you quality time with our providers, and arriving late affects you and other patients whose appointments are after yours.  Also, if you no show three or more times for appointments you may be dismissed from the clinic at the providers discretion.     Again, thank you for choosing [facility].  Our hope is that these requests will decrease the amount of time that you wait before being seen by our physicians.       _____________________________________________________________  Should you have questions after your visit to [facility], please contact our office at [phone] between the hours of 8:30 a.m. and 4:30 p.m.  Voicemails left after 4:30 p.m. will not be returned until the following business day.  For prescription refill requests, have your pharmacy contact our office.       Resources For Cancer Patients and their Caregivers ? American Cancer Society: Can assist with  transportation, wigs, general needs, runs Look Good Feel Better.        574-352-5438 ? Cancer Care: Provides financial assistance, online support groups, medication/co-pay assistance.  1-800-813-HOPE 207-773-0632) ? [place] Assists [place] cancer patients and their families through emotional , educational and financial support.  661-883-5151 ? [place] DSS Where to apply for food stamps, Medicaid and utility assistance. (619) 271-8363 ? [facility]: Transportation to medical appointments. (240)259-5476 ? Social Security Administration: May apply for disability if have a Stage IV cancer. (518)406-1257 9716650564 ? [employer], Disability and Transit Services: Assists with nutrition, care and transit needs. [place] Support Programs: @10RELATIVEDAYS @ > Cancer Support Group  2nd Tuesday of the month 1pm-2pm, Journey Room  > Creative Journey  3rd Tuesday of the month 1130am-1pm, Journey Room  > Look Good Feel Better  1st Wednesday of the month 10am-12 noon, Journey Room (Call [place] to register [phone])

## 2016-12-01 NOTE — Progress Notes (Signed)
Patient seen and evaluated for hypersensitivity reaction with 1st dose of Feraheme.   BP stable. O2 sats >95% on RA. Patient tachypneic and with mild tachycardia. Reports "feeling my throat closing up and having trouble breathing."   Benadryl 25 mg IV given x 1. Solu-Medrol 125 mg IV given x 1. 250 mL NS bolus given as well.    Exam:  -Patient in mild distress. O2 placed at 2L for comfort.  -Tachycardic, but regular rhythm.  -Mild expiratory wheezes to upper lobes noted bilaterally.    Symptoms resolved with Benadryl and Solu-Medrol.  Allergy list updated with addition of Feraheme.   Patient remains with stable VS and adequate O2 sats.    Mike Craze, NP [place] [phone]

## 2016-12-01 NOTE — Progress Notes (Signed)
Edit Addendum:   -Patient received 50 mg IV Benadryl (previous note stated she received 25 mg IV; this was charted in error).   Mike Craze, NP [place] [phone]

## 2016-12-15 ENCOUNTER — Ambulatory Visit: Payer: Medicaid Other | Admitting: Orthopedic Surgery

## 2016-12-16 ENCOUNTER — Ambulatory Visit (INDEPENDENT_AMBULATORY_CARE_PROVIDER_SITE_OTHER): Payer: Medicaid Other

## 2016-12-16 ENCOUNTER — Ambulatory Visit (INDEPENDENT_AMBULATORY_CARE_PROVIDER_SITE_OTHER): Payer: Self-pay | Admitting: Orthopedic Surgery

## 2016-12-16 ENCOUNTER — Encounter: Payer: Self-pay | Admitting: Orthopedic Surgery

## 2016-12-16 DIAGNOSIS — S82142D Displaced bicondylar fracture of left tibia, subsequent encounter for closed fracture with routine healing: Secondary | ICD-10-CM

## 2016-12-16 NOTE — Patient Instructions (Signed)
Brace and walker 4 weeks

## 2016-12-16 NOTE — Progress Notes (Signed)
Fracture care   Chief Complaint  Patient presents with  . Follow-up    LEFT TIBIAL PLATEAU FRACTURE, DOI 09/16/16    12 weeks post tibial plateau fracture  Patient says she is getting better less pain  Overall alignment of the limb is normal her knee flexion arc is 0-120 she has no instability her x-ray looks good  Walker and brace 4 weeks and then follow-up for last x-ray

## 2017-01-13 ENCOUNTER — Other Ambulatory Visit: Payer: Self-pay | Admitting: Family Medicine

## 2017-01-14 ENCOUNTER — Ambulatory Visit (INDEPENDENT_AMBULATORY_CARE_PROVIDER_SITE_OTHER): Payer: Medicaid Other | Admitting: Family Medicine

## 2017-01-14 ENCOUNTER — Encounter: Payer: Self-pay | Admitting: Family Medicine

## 2017-01-14 ENCOUNTER — Other Ambulatory Visit: Payer: Self-pay | Admitting: Family Medicine

## 2017-01-14 VITALS — BP 122/82 | HR 83 | Resp 16 | Ht 65.0 in | Wt 156.4 lb

## 2017-01-14 DIAGNOSIS — E559 Vitamin D deficiency, unspecified: Secondary | ICD-10-CM

## 2017-01-14 DIAGNOSIS — I10 Essential (primary) hypertension: Secondary | ICD-10-CM | POA: Diagnosis not present

## 2017-01-14 DIAGNOSIS — F418 Other specified anxiety disorders: Secondary | ICD-10-CM | POA: Diagnosis not present

## 2017-01-14 DIAGNOSIS — F17200 Nicotine dependence, unspecified, uncomplicated: Secondary | ICD-10-CM

## 2017-01-14 DIAGNOSIS — Z1231 Encounter for screening mammogram for malignant neoplasm of breast: Secondary | ICD-10-CM

## 2017-01-14 NOTE — Patient Instructions (Addendum)
Physical exam early September , call iof you need me before  Mammogram to be scheduled, [phone]   You are referred to [facility] to go when able just call.  Fasting lipid, cmp and EGFR, tSH, vit D 1 week before follow up  Please continue to work on smoking cessation  Thankful you are doing better   Enjoy Spring!

## 2017-01-16 ENCOUNTER — Encounter: Payer: Self-pay | Admitting: Orthopedic Surgery

## 2017-01-16 ENCOUNTER — Ambulatory Visit (INDEPENDENT_AMBULATORY_CARE_PROVIDER_SITE_OTHER): Payer: Medicaid Other

## 2017-01-16 ENCOUNTER — Ambulatory Visit (INDEPENDENT_AMBULATORY_CARE_PROVIDER_SITE_OTHER): Payer: Medicaid Other | Admitting: Orthopedic Surgery

## 2017-01-16 ENCOUNTER — Encounter: Payer: Self-pay | Admitting: Family Medicine

## 2017-01-16 DIAGNOSIS — S82132D Displaced fracture of medial condyle of left tibia, subsequent encounter for closed fracture with routine healing: Secondary | ICD-10-CM | POA: Diagnosis not present

## 2017-01-16 DIAGNOSIS — S82122D Displaced fracture of lateral condyle of left tibia, subsequent encounter for closed fracture with routine healing: Secondary | ICD-10-CM | POA: Diagnosis not present

## 2017-01-16 NOTE — Progress Notes (Signed)
Patient ID: [name], female   DOB: 01-13-1961, 56 y.o.   MRN: [id number]  Chief Complaint  Patient presents with  . Follow-up    LEFT TIBIAL PLATEAU FRACTURE, DOI 09/16/16    HPI   The patient is here for follow-up visit regarding her left tibial plateau fracture. Her history as noted below  She has no complaints of pain at this time.   [name] is a 56 y.o. female.  He 2 days status post tibial plateau fracture left leg. Patient appears to have some type of mental disorders here with a Education officer, museum HPI She sustained a fall was taken to the ER she had an x-ray and a CAT scan she has 2.5 mm of central depression in slight split of the lateral tibial plateau.   She denies of increased swelling since left the hospital, initially had severe lateral knee pain with swelling which was associated with throbbing aching sensation without numbness or tingling   She has been walking on the leg with a brace and a walker  Review of Systems  Respiratory: Negative for shortness of breath.   Skin: Negative for rash.  Neurological: Negative for tingling and focal weakness.    LMP 07/25/2016   Ortho Exam Left knee The plateaus are nontender. There is no joint effusion. The leg is swollen it appears to be around the straps of the brace below and above  The knee is stable and anteroposterior plane as well as the medial lateral plane.  Mild quadriceps weakness no atrophy  Range of motion is 3-120 degrees.  Sensation is normal. No peripheral edema is noted other than stated  The patient is ambulatory with a walker and a brace and was able to walk without the brace with a walker  She is awake alert and oriented she is in a good mood especially when I told her brace could come off  Her x-ray shows healing of a proximal tibial plateau fracture  She can remove the brace and use the walker for 2 weeks then she can walk independently  Follow-up as needed any problems she is to let us  know  A/P  Medical decision-making  Encounter Diagnosis  Name Primary?  . Closed fracture of lateral portion of left tibial plateau with routine healing, subsequent encounter Yes     Arther Abbott, MD 01/16/2017 10:41 AM

## 2017-01-16 NOTE — Assessment & Plan Note (Signed)
Controlled, no change in medication DASH diet and commitment to daily physical activity for a minimum of 30 minutes discussed and encouraged, as a part of hypertension management. The importance of attaining a healthy weight is also discussed.  BP/Weight 01/14/2017 12/01/2016 11/20/2016 11/14/2016 10/23/2016 10/07/2016 12/12/1914  Systolic BP 606 004 599 774 142 395 320  Diastolic BP 82 77 233 64 80 75 64  Wt. (Lbs) 156.4 - 150 142 152.12 - 158  BMI 26.03 - 24.96 23.63 25.31 - 26.29

## 2017-01-16 NOTE — Assessment & Plan Note (Signed)

## 2017-01-16 NOTE — Assessment & Plan Note (Signed)
Controlled, no change in medication  

## 2017-01-16 NOTE — Patient Instructions (Signed)
You can take the brace off of your knee  Use the walker for 2 weeks and then you can stop using that

## 2017-01-16 NOTE — Progress Notes (Signed)
   KC SEDLAK     MRN: [id number]      DOB: 1961/09/03   HPI [name] is here for follow up and re-evaluation of chronic medical conditions, medication management and review of any available recent lab and radiology data.  Preventive health is updated, specifically  Cancer screening and Immunization.   Has upcoming appointment with ortho, is anxious to get her left boot off so she can return to podiatry as toenails and feet need attention The PT denies any adverse reactions to current medications since the last visit.  Working on quitting smoking but unwilling to set a quit date at this time, down to 5, wants to quit which is good    ROS Denies recent fever or chills. Denies sinus pressure, nasal congestion, ear pain or sore throat. Denies chest congestion, productive cough or wheezing. Denies chest pains, palpitations and leg swelling Denies abdominal pain, nausea, vomiting,diarrhea or constipation.   Denies dysuria, frequency, hesitancy or incontinence.  Denies headaches, seizures, numbness, or tingling. Denies depression, anxiety or insomnia. Denies skin break down or rash.   PE  BP 122/82   Pulse 83   Resp 16   Ht 5\' 5"  (1.651 m)   Wt 156 lb 6.4 oz (70.9 kg)   LMP 07/25/2016   SpO2 98%   BMI 26.03 kg/m   Patient alert and oriented and in no cardiopulmonary distress.  HEENT: No facial asymmetry, EOMI,   oropharynx pink and moist.  Neck supple no JVD, no mass.  Chest: Clear to auscultation bilaterally.  CVS: S1, S2 no murmurs, no S3.Regular rate.  ABD: Soft non tender.   Ext: No edema  MS: Adequate ROM spine, shoulders, decreased in LLE due to immobilizing boot.  Skin: Intact, no ulcerations or rash noted.  Psych: Good eye contact, normal affect. Memory intact not anxious or depressed appearing.  CNS: CN 2-12 intact, power,  normal throughout.no focal deficits noted.   Assessment & Plan  Essential hypertension Controlled, no change in medication DASH  diet and commitment to daily physical activity for a minimum of 30 minutes discussed and encouraged, as a part of hypertension management. The importance of attaining a healthy weight is also discussed.  BP/Weight 01/14/2017 12/01/2016 11/20/2016 11/14/2016 10/23/2016 10/07/2016 2/29/7989  Systolic BP 211 941 740 814 481 856 314  Diastolic BP 82 77 970 64 80 75 64  Wt. (Lbs) 156.4 - 150 142 152.12 - 158  BMI 26.03 - 24.96 23.63 25.31 - 26.29       Depression with anxiety Controlled, no change in medication   Chronic lymphocytic leukemia (CLL), B-cell (HCC) Followed by heme/onc  Closed left fibular fracture Hopeful that immobilizing boot will be removed later this week, recent x ray showed good healing  NICOTINE ADDICTION Patient counseled for approximately 5 minutes regarding the health risks of ongoing nicotine use, specifically all types of cancer, heart disease, stroke and respiratory failure. The options available for help with cessation ,the behavioral changes to assist the process, and the option to either gradully reduce usage  Or abruptly stop.is also discussed. Pt is also encouraged to set specific goals in number of cigarettes used daily, as well as to set a quit date.

## 2017-01-16 NOTE — Assessment & Plan Note (Signed)
Hopeful that immobilizing boot will be removed later this week, recent x ray showed good healing

## 2017-01-16 NOTE — Assessment & Plan Note (Signed)
Followed by heme/ onc 

## 2017-01-25 ENCOUNTER — Emergency Department (HOSPITAL_COMMUNITY)
Admission: EM | Admit: 2017-01-25 | Discharge: 2017-01-26 | Disposition: A | Discharge status: Home / Self Care | Payer: Medicaid Other | Attending: Emergency Medicine | Admitting: Emergency Medicine

## 2017-01-25 ENCOUNTER — Encounter (HOSPITAL_COMMUNITY): Payer: Self-pay | Admitting: Emergency Medicine

## 2017-01-25 DIAGNOSIS — Z79899 Other long term (current) drug therapy: Secondary | ICD-10-CM | POA: Diagnosis not present

## 2017-01-25 DIAGNOSIS — F331 Major depressive disorder, recurrent, moderate: Secondary | ICD-10-CM | POA: Diagnosis not present

## 2017-01-25 DIAGNOSIS — I10 Essential (primary) hypertension: Secondary | ICD-10-CM | POA: Diagnosis not present

## 2017-01-25 DIAGNOSIS — F329 Major depressive disorder, single episode, unspecified: Secondary | ICD-10-CM | POA: Diagnosis not present

## 2017-01-25 DIAGNOSIS — F17210 Nicotine dependence, cigarettes, uncomplicated: Secondary | ICD-10-CM | POA: Insufficient documentation

## 2017-01-25 DIAGNOSIS — R45851 Suicidal ideations: Secondary | ICD-10-CM

## 2017-01-25 LAB — RAPID URINE DRUG SCREEN, HOSP PERFORMED
Amphetamines: NOT DETECTED
BARBITURATES: NOT DETECTED
Benzodiazepines: NOT DETECTED
Cocaine: NOT DETECTED
Opiates: NOT DETECTED
TETRAHYDROCANNABINOL: NOT DETECTED

## 2017-01-25 NOTE — ED Triage Notes (Signed)
Brought in by [place] pd.  Pt states her son did not call her today and its mothers day.  Pt states she is feeling suicidal and would shoot until there were no more bullets.  etoh on board.

## 2017-01-25 NOTE — ED Provider Notes (Signed)
[facility] DEPT Provider Note   CSN: [id number] Arrival date & time: 01/25/17  2208   By signing my name below, I, Eunice Blase, attest that this documentation has been prepared under the direction and in the presence of Nazareth Kirk, Barbette Hair, MD. Electronically signed, Eunice Blase, ED Scribe. 01/25/17. 12:46 AM.   History   Chief Complaint Chief Complaint  Patient presents with  . V70.1   The history is provided by the patient and medical records. No language interpreter was used.    [name] is a 56 y.o. female with h/o psychosis and anxiety, BIB police to the Emergency Department with concern for SI noted this evening. Associated HI noted. Pt reportedly called her son and left an angry voicemail where she stated she would "shoot herself and another person until there were no bullets left in the gun". She states this is related to anxiety because her son did not contact her to wish her a happy mother's day today. She reports an attempt to take her life in her teenage years, but none recently. She alleges she voluntarily called police this evening. Pt adds she drank two beers this evening and she smokes cigarettes. Pt alleges she does not normally drink. No pain. No other complaints at this time.   Past Medical History:  Diagnosis Date  . Anxiety   . Chronic abdominal pain    resolved  . Chronic lymphocytic leukemia (CLL), B-cell ([place]) 04/04/2016  . CLL (chronic lymphocytic leukemia) ([place])   . H. pylori infection 2/09   s/p prevpac  . Hypertension   . Iron deficiency 11/17/2016  . Lymphadenopathy 01/17/2016  . Nicotine addiction   . Psychosis    social worker states that patient does not like to talk about this and will stop her meds if you try and discuss this with her.    Patient Active Problem List   Diagnosis Date Noted  . Iron deficiency 11/17/2016  . Tibial plateau fracture, left, closed, initial encounter 09/16/2016  . Closed left fibular fracture 09/16/2016  .  PVD (peripheral vascular disease) ([place]) 07/27/2016  . Chronic lymphocytic leukemia (CLL), B-cell ([place]) 04/04/2016  . Mass of right side of neck 01/21/2016  . Lymphadenopathy 01/17/2016  . Depression with anxiety 07/07/2015  . Inflammatory arthritis 04/18/2015  . 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 Rheumatoid arthritis ([place]) 02/12/2012  . NICOTINE ADDICTION 04/29/2010  . HAND PAIN, BILATERAL 01/29/2009  . Essential hypertension 11/24/2007    Past Surgical History:  Procedure Laterality Date  . AXILLARY LYMPH NODE BIOPSY Right 03/28/2016   Procedure: RIGHT AXILLARY LYMPH NODE BIOPSY;  Surgeon: Aviva Signs, MD;  Location: [facility];  Service: General;  Laterality: Right;  . CHOLECYSTECTOMY    . COLONOSCOPY  02/07/2011   SLF: Slightly tortuous colon.  Otherwise normal colon without evidence of polyps, masses, inflammatory changes, diverticular AVMs  . HAMMER TOE SURGERY Left   . HYSTEROSCOPY W/D&C N/A 07/30/2016   Procedure: DILATATION AND CURETTAGE /HYSTEROSCOPY;  Surgeon: Florian Buff, MD;  Location: [facility];  Service: Gynecology;  Laterality: N/A;  . Surgery of left middle finger on left hand (childhood )    . TUBAL LIGATION      OB History    No data available       Home Medications    Prior to Admission medications   Medication Sig Start Date End Date Taking? Authorizing Provider  amLODipine (NORVASC) 10 MG tablet TAKE 1 TABLET BY MOUTH ONCE DAILY. 01/13/17  Fayrene Helper, MD  PARoxetine (PAXIL) 20 MG tablet TAKE 1 TABLET BY MOUTH ONCE A DAY. 01/13/17   Fayrene Helper, MD  Vitamin D, Ergocalciferol, (DRISDOL) 50000 units CAPS capsule TAKE 1 CAPSULE BY MOUTH EVERY 7 DAYS. 01/13/17   Fayrene Helper, MD    Family History Family History  Problem Relation Age of Onset  . Cancer Mother     Social History Social History  Substance Use Topics  . Smoking status: Light Tobacco Smoker    Packs/day: 0.25    Years: 16.00    Types: Cigarettes  . Smokeless  tobacco: Never Used     Comment: 5 per day   . Alcohol use 0.0 oz/week     Comment: occ     Allergies   Feraheme [ferumoxytol]   Review of Systems Review of Systems  Respiratory: Negative for shortness of breath.   Cardiovascular: Negative for chest pain.  Gastrointestinal: Negative for abdominal pain.  Psychiatric/Behavioral: Positive for agitation and suicidal ideas. Negative for self-injury. The patient is nervous/anxious.        + homicidal ideations  All other systems reviewed and are negative.    Physical Exam Updated Vital Signs BP (!) 142/92   Pulse 96   Temp 98.6 F (37 C)   Resp 20   Ht 5\' 5"  (1.651 m)   Wt 156 lb (70.8 kg)   LMP 07/25/2016   SpO2 95%   BMI 25.96 kg/m   Physical Exam  Constitutional: She is oriented to person, place, and time. No distress.  Disheveled appearing, no acute distress  HENT:  Head: Normocephalic and atraumatic.  Cardiovascular: Normal rate, regular rhythm and normal heart sounds.   Pulmonary/Chest: Effort normal. No respiratory distress.  Neurological: She is alert and oriented to person, place, and time.  Skin: Skin is warm and dry.  Psychiatric:  Sad but cooperative  Nursing note and vitals reviewed.    ED Treatments / Results  DIAGNOSTIC STUDIES: Oxygen Saturation is 95% on RA, adequate by my interpretation.    COORDINATION OF CARE: 11:20 PM-Discussed next steps with pt. Pt verbalized understanding and is agreeable with the plan. Pt prepared for behavioral health evaluation.    Labs (all labs ordered are listed, but only abnormal results are displayed) Labs Reviewed  ETHANOL - Abnormal; Notable for the following:       Result Value   Alcohol, Ethyl (B) 233 (*)    All other components within normal limits  CBC WITH DIFFERENTIAL/PLATELET - Abnormal; Notable for the following:    WBC 13.5 (*)    RDW 18.0 (*)    Lymphs Abs 10.3 (*)    All other components within normal limits  ACETAMINOPHEN LEVEL - Abnormal;  Notable for the following:    Acetaminophen (Tylenol), Serum <10 (*)    All other components within normal limits  RAPID URINE DRUG SCREEN, HOSP PERFORMED  BASIC METABOLIC PANEL  SALICYLATE LEVEL    EKG  EKG Interpretation None       Radiology No results found.  Procedures Procedures (including critical care time)  Medications Ordered in ED Medications  ziprasidone (GEODON) injection 20 mg (20 mg Intramuscular Given 01/26/17 0320)     Initial Impression / Assessment and Plan / ED Course  I have reviewed the triage vital signs and the nursing notes.  Pertinent labs & imaging results that were available during my care of the patient were reviewed by me and considered in my medical decision making (see chart  for details).     Patient presents with suicidal ideation. Reports depression earlier today and thoughts of wanting to hurt herself with a gun. She does report some alcohol use. She is otherwise nontoxic. Lab work is reassuring. She was evaluated by TTS recommends inpatient. Patient is not agreeable to staying voluntarily. She was IVC. She got very angry. She was yelling and uncooperative. She was given Geodon.  Final Clinical Impressions(s) / ED Diagnoses   Final diagnoses:  Suicidal ideation    New Prescriptions New Prescriptions   No medications on file   I personally performed the services described in this documentation, which was scribed in my presence. The recorded information has been reviewed and is accurate.    Merryl Hacker, MD 01/26/17 [phone]

## 2017-01-26 DIAGNOSIS — F17210 Nicotine dependence, cigarettes, uncomplicated: Secondary | ICD-10-CM | POA: Diagnosis not present

## 2017-01-26 DIAGNOSIS — F331 Major depressive disorder, recurrent, moderate: Secondary | ICD-10-CM | POA: Diagnosis not present

## 2017-01-26 LAB — BASIC METABOLIC PANEL
ANION GAP: 10 (ref 5–15)
BUN: 14 mg/dL (ref 6–20)
CO2: 22 mmol/L (ref 22–32)
Calcium: 9 mg/dL (ref 8.9–10.3)
Chloride: 110 mmol/L (ref 101–111)
Creatinine, Ser: 0.67 mg/dL (ref 0.44–1.00)
Glucose, Bld: 97 mg/dL (ref 65–99)
POTASSIUM: 3.7 mmol/L (ref 3.5–5.1)
Sodium: 142 mmol/L (ref 135–145)

## 2017-01-26 LAB — CBC WITH DIFFERENTIAL/PLATELET
BLASTS: 0 %
Band Neutrophils: 0 %
Basophils Absolute: 0 10*3/uL (ref 0.0–0.1)
Basophils Relative: 0 %
EOS ABS: 0.1 10*3/uL (ref 0.0–0.7)
Eosinophils Relative: 1 %
HEMATOCRIT: 38 % (ref 36.0–46.0)
HEMOGLOBIN: 13.1 g/dL (ref 12.0–15.0)
LYMPHS PCT: 76 %
Lymphs Abs: 10.3 10*3/uL — ABNORMAL HIGH (ref 0.7–4.0)
MCH: 29.5 pg (ref 26.0–34.0)
MCHC: 34.5 g/dL (ref 30.0–36.0)
MCV: 85.6 fL (ref 78.0–100.0)
MONOS PCT: 2 %
Metamyelocytes Relative: 0 %
Monocytes Absolute: 0.3 10*3/uL (ref 0.1–1.0)
Myelocytes: 0 %
NEUTROS ABS: 2.8 10*3/uL (ref 1.7–7.7)
NEUTROS PCT: 21 %
NRBC: 0 /100{WBCs}
OTHER: 0 %
PROMYELOCYTES ABS: 0 %
Platelets: 249 10*3/uL (ref 150–400)
RBC: 4.44 MIL/uL (ref 3.87–5.11)
RDW: 18 % — ABNORMAL HIGH (ref 11.5–15.5)
WBC: 13.5 10*3/uL — ABNORMAL HIGH (ref 4.0–10.5)

## 2017-01-26 LAB — ETHANOL: ALCOHOL ETHYL (B): 233 mg/dL — AB (ref <5)

## 2017-01-26 LAB — ACETAMINOPHEN LEVEL: Acetaminophen (Tylenol), Serum: <10 ug/mL — ABNORMAL LOW (ref 10–30)

## 2017-01-26 LAB — SALICYLATE LEVEL

## 2017-01-26 MED ORDER — ZIPRASIDONE MESYLATE 20 MG IM SOLR
20.0000 mg | Freq: Once | INTRAMUSCULAR | Status: AC
  Administered 2017-01-26: 20 mg via INTRAMUSCULAR

## 2017-01-26 MED ORDER — ZIPRASIDONE MESYLATE 20 MG IM SOLR
INTRAMUSCULAR | Status: AC
  Filled 2017-01-26: qty 20

## 2017-01-26 NOTE — Progress Notes (Signed)
Per Lindon Romp, NP meets inpatient criteria [place] Muhlb[facility] K. Nash Shearer, LPC-A, [facility]  Counselor 01/26/2017 2:28 AM

## 2017-01-26 NOTE — Progress Notes (Signed)
Referral submitted To:  [place], [facility], [place], [place], [facility], [place], [place], [place], [place], [place], [place], [place], [place], [place] K. Seymore Brodowski, LCAS-A, LPC-A, [place]  Counselor 01/26/2017 3:00 AM

## 2017-01-26 NOTE — BH Assessment (Signed)
Tele Assessment Note   [name] is an 56 y.o. female, African American who presents to [facility] per ED report:  h/o psychosis and anxiety, BIB police to the Emergency Department with concern for SI noted this evening. Associated HI noted. Pt reportedly called her son and left an angry voicemail where she stated she would "shoot herself and another person until there were no bullets left in the gun". She states this is related to anxiety because her son did not contact her to wish her a happy mother's day today. She reports an attempt to take her life in her teenage years, but none recently. She alleges she voluntarily called police this evening. Pt adds she drank two beers this evening and she smokes cigarettes. Pt alleges she does not normally drink. Patient states primary concern depression. Patient states she was upset because son did not contact her on Mothers Day. Patient acknowledges hx. Of depression. Patient states she has DSS case worker, but not guardian or P.O.A.  Patient states lives alone, and earlier 01/25/17 was having SI and some HI with intent harm someone else, not named. Patient states does not feel that way now.   Patient denies current SI/HI and AVH. Patient denies hx. Of S.A. Patient acknowledges past inpatient psych care with [facility] in 2000/2016 for SI and depression. Patient states is not seen outpatient for psych care. Patient is dressed in scrubs and is alert and oriented x4. Patient speech was within normal limits and motor behavior appeared normal. Patient thought process is coherent. Patient  does not appear to be responding to internal stimuli. Patient was cooperative throughout the assessment and states that she is  Not agreeable to inpatient psychiatric treatment.   Diagnosis: Major Depressive Disorder  Past Medical History:  Past Medical History:  Diagnosis Date  . Anxiety   . Chronic abdominal pain    resolved  . Chronic lymphocytic leukemia (CLL), B-cell  ([place]) 04/04/2016  . CLL (chronic lymphocytic leukemia) ([place])   . H. pylori infection 2/09   s/p prevpac  . Hypertension   . Iron deficiency 11/17/2016  . Lymphadenopathy 01/17/2016  . Nicotine addiction   . Psychosis    social worker states that patient does not like to talk about this and will stop her meds if you try and discuss this with her.    Past Surgical History:  Procedure Laterality Date  . AXILLARY LYMPH NODE BIOPSY Right 03/28/2016   Procedure: RIGHT AXILLARY LYMPH NODE BIOPSY;  Surgeon: Aviva Signs, MD;  Location: [facility];  Service: General;  Laterality: Right;  . CHOLECYSTECTOMY    . COLONOSCOPY  02/07/2011   SLF: Slightly tortuous colon.  Otherwise normal colon without evidence of polyps, masses, inflammatory changes, diverticular AVMs  . HAMMER TOE SURGERY Left   . HYSTEROSCOPY W/D&C N/A 07/30/2016   Procedure: DILATATION AND CURETTAGE /HYSTEROSCOPY;  Surgeon: Florian Buff, MD;  Location: [facility];  Service: Gynecology;  Laterality: N/A;  . Surgery of left middle finger on left hand (childhood )    . TUBAL LIGATION      Family History:  Family History  Problem Relation Age of Onset  . Cancer Mother     Social History:  reports that she has been smoking Cigarettes.  She has a 4.00 pack-year smoking history. She has never used smokeless tobacco. She reports that she drinks alcohol. She reports that she does not use drugs.  Additional Social History:  Alcohol / Drug Use Pain Medications: SEE  MAR Prescriptions: SEE MAR Over the Counter: SEE MAR  CIWA: CIWA-Ar BP: (!) 142/92 Pulse Rate: 96 COWS:    PATIENT STRENGTHS: (choose at least two) Average or above average intelligence Capable of independent living Communication skills  Allergies:  Allergies  Allergen Reactions  . Feraheme [Ferumoxytol] Shortness Of Breath and Swelling    Home Medications:  (Not in a hospital admission)  OB/GYN Status:  Patient's last menstrual period was 07/25/2016.  General  Assessment Data Location of Assessment: [facility] ED TTS Assessment: In system Is this a Tele or Face-to-Face Assessment?: Tele Assessment Is this an Initial Assessment or a Re-assessment for this encounter?: Initial Assessment Marital status: Single Is patient pregnant?: No Pregnancy Status: No Living Arrangements: Alone Can pt return to current living arrangement?: Yes Admission Status: Involuntary Is patient capable of signing voluntary admission?: Yes Referral Source: Other Insurance type: Medicaid     Crisis Care Plan Living Arrangements: Alone Name of Psychiatrist: none Name of Therapist: none  Education Status Is patient currently in school?: No Current Grade: n/a Highest grade of school patient has completed: 12th Name of school: n/a Contact person: none given  Risk to self with the past 6 months Suicidal Ideation: No Has patient been a risk to self within the past 6 months prior to admission? : Yes Suicidal Intent: No Has patient had any suicidal intent within the past 6 months prior to admission? : Yes Is patient at risk for suicide?: Yes Suicidal Plan?: No Has patient had any suicidal plan within the past 6 months prior to admission? : Yes Access to Means: Yes Specify Access to Suicidal Means: access to sharps What has been your use of drugs/alcohol within the last 12 months?: none Previous Attempts/Gestures: Yes How many times?: 1 Other Self Harm Risks: none noted Triggers for Past Attempts: Unpredictable Intentional Self Injurious Behavior: None Family Suicide History: Unknown Recent stressful life event(s): Turmoil (Comment) Persecutory voices/beliefs?: No Depression: Yes Depression Symptoms: Insomnia, Tearfulness, Isolating, Fatigue, Guilt, Loss of interest in usual pleasures, Feeling worthless/self pity Substance abuse history and/or treatment for substance abuse?: No Suicide prevention information given to non-admitted patients: Yes  Risk to Others  within the past 6 months Homicidal Ideation: No Does patient have any lifetime risk of violence toward others beyond the six months prior to admission? : No Thoughts of Harm to Others: No Current Homicidal Intent: No Current Homicidal Plan: No Access to Homicidal Means: No Identified Victim: none History of harm to others?: No Assessment of Violence: None Noted Violent Behavior Description: n/a Does patient have access to weapons?: No Criminal Charges Pending?: No Does patient have a court date: No Is patient on probation?: No  Psychosis Hallucinations: None noted Delusions: None noted  Mental Status Report Appearance/Hygiene: In scrubs Eye Contact: Fair Motor Activity: Freedom of movement Speech: Logical/coherent Level of Consciousness: Alert Mood: Depressed Affect: Depressed Anxiety Level: Moderate Thought Processes: Relevant Judgement: Unimpaired Orientation: Person, Place, Time, Situation, Appropriate for developmental age Obsessive Compulsive Thoughts/Behaviors: None  Cognitive Functioning Concentration: Normal Memory: Recent Intact, Remote Intact IQ: Average Insight: Fair Impulse Control: Poor Appetite: Poor Weight Loss: 0 Weight Gain: 0 Sleep: Decreased Total Hours of Sleep: 4 Vegetative Symptoms: None  ADLScreening [facility] Assessment Services) Patient's cognitive ability adequate to safely complete daily activities?: Yes Patient able to express need for assistance with ADLs?: Yes Independently performs ADLs?: Yes (appropriate for developmental age)  Prior Inpatient Therapy Prior Inpatient Therapy: Yes Prior Therapy Dates: unknown possible 2000/2006 Prior Therapy Facilty/Provider(s): [facility] Reason  for Treatment: depression  Prior Outpatient Therapy Prior Outpatient Therapy: No Prior Therapy Dates: n/a Prior Therapy Facilty/Provider(s): n/a Reason for Treatment: n/a Does patient have an ACCT team?: No Does patient have Intensive In-House Services?   : No Does patient have [facility] services? : No Does patient have P4CC services?: No  ADL Screening (condition at time of admission) Patient's cognitive ability adequate to safely complete daily activities?: Yes Is the patient deaf or have difficulty hearing?: No Does the patient have difficulty seeing, even when wearing glasses/contacts?: No Does the patient have difficulty concentrating, remembering, or making decisions?: No Patient able to express need for assistance with ADLs?: Yes Does the patient have difficulty dressing or bathing?: No Independently performs ADLs?: Yes (appropriate for developmental age) Does the patient have difficulty walking or climbing stairs?: Yes (uses cane) Weakness of Legs: Both (uses cane) Weakness of Arms/Hands: None  Home Assistive Devices/Equipment Home Assistive Devices/Equipment: Cane (specify quad or straight)    Abuse/Neglect Assessment (Assessment to be complete while patient is alone) Physical Abuse: Denies Verbal Abuse: Denies Sexual Abuse: Denies Exploitation of patient/patient's resources: Denies Self-Neglect: Denies Values / Beliefs Cultural Requests During Hospitalization: None Spiritual Requests During Hospitalization: None   Advance Directives (For Healthcare) Does Patient Have a Medical Advance Directive?: No    Additional Information 1:1 In Past 12 Months?: No CIRT Risk: No Elopement Risk: No Does patient have medical clearance?: Yes     Disposition: Per Lindon Romp, NP meets inpatient criteria Disposition Initial Assessment Completed for this Encounter: Yes Disposition of Patient: Other dispositions (TBD)  Amire Gossen K Alvy Alsop 01/26/2017 2:19 AM

## 2017-01-26 NOTE — ED Notes (Signed)
Pt agitated and acting out. Tearful. Slamming door to her room. Attempting to leave. States she wants to go home. States she "won't be bad no more" and that she promised that if she went home she "wouldn't hurt herself or nobody else". Security and [employer] at bedside. Dr. Dina Rich notified of pt's behavior and new orders given. Will continue to monitor.

## 2017-01-26 NOTE — ED Notes (Signed)
Pt asleep. Will continue to monitor.

## 2017-01-26 NOTE — BHH Counselor (Signed)
Pt reassessed by Elmarie Shiley NP who determined that pt does not need inpatient criteria. Pt denies SI, HI and access to weapons. Son was contacted to also confirmed she does not have access to a gun. Counselor called to see if pt would like follow up resources for counseling but she declined the information. RN notified of disposition.   [address] [place], LCAS

## 2017-01-26 NOTE — ED Notes (Signed)
Pt awake, calm and pleasant.  Pt eating breakfast.

## 2017-01-26 NOTE — ED Notes (Signed)
Per TTS consult, pt. Recommended for discharge.  Pt denies need for further behavioral resources upon discharge. Pt calm, cooperative. EDP notified.

## 2017-01-26 NOTE — ED Provider Notes (Signed)
Patient evaluated by TTS. Recommended discharge home. Patient is calm and cooperative. She denies any current suicidal ideation. States she simply got upset when she was making threats to herself and others. IVC was rescinded.   Julianne Rice, MD 01/26/17 [phone]

## 2017-01-26 NOTE — Consult Note (Signed)
Telepsych Consultation   Reason for Consult: Depression with suicidal ideations Referring Physician: EDP Patient Identification: [name] MRN:  [id number] Principal Diagnosis: Major depressive disorder, recurrent episode, moderate degree ([place]) Diagnosis:   Patient Active Problem List   Diagnosis Date Noted  . Major depressive disorder, recurrent episode, moderate degree ([place]) [F33.1] 01/26/2017  . Iron deficiency [E61.1] 11/17/2016  . Tibial plateau fracture, left, closed, initial encounter [S82.142A] 09/16/2016  . Closed left fibular fracture [S82.402A] 09/16/2016  . PVD (peripheral vascular disease) ([place]) [I73.9] 07/27/2016  . Chronic lymphocytic leukemia (CLL), B-cell ([place]) [C91.10] 04/04/2016  . Mass of right side of neck [R22.1] 01/21/2016  . Lymphadenopathy [R59.1] 01/17/2016  . Depression with anxiety [F41.8] 07/07/2015  . Inflammatory arthritis [M19.90] 04/18/2015  . 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 Rheumatoid arthritis ([place]) [M06.9] 02/12/2012  . NICOTINE ADDICTION [F17.200] 04/29/2010  . HAND PAIN, BILATERAL [M79.609] 01/29/2009  . Essential hypertension [I10] 11/24/2007    Total Time spent with patient: 20 minutes  Subjective:   [name] is a 56 y.o. female patient admitted with suicidal ideation to shoot herself.   HPI:    Per initial [facility] Assessment 01/26/2017 by Cheryle Horsfall:   [name] is an 56 y.o. female, African American who presents to [facility] per ED report: h/o psychosis and anxiety, BIB policeto the Emergency Department with concern forSI noted this evening. Associated HI noted. Pt reportedly called her son and left an angry voicemail where she stated she would "shoot herself and another person until there were no bullets left in the gun". She states this is related to anxiety because her son did not contact her to wish her a happy mother's day today. She reports an attempt to take her life in her teenage years, but none recently.  She alleges she voluntarily called police this evening. Pt adds she drank two beers this evening and shesmokes cigarettes. Pt alleges she does not normally drink. Patient states primary concern depression. Patient states she was upset because son did not contact her on Mothers Day. Patient acknowledges hx. Of depression. Patient states she has DSS case worker, but not guardian or P.O.A.  Patient states lives alone, and earlier 01/25/17 was having SI and some HI with intent harm someone else, not named. Patient states does not feel that way now.   Patient denies current SI/HI and AVH. Patient denies hx. Of S.A. Patient acknowledges past inpatient psych care with [facility] in 2000/2016 for SI and depression. Patient states is not seen outpatient for psych care. Patient is dressed in scrubs and is alert and oriented x4. Patient speech was within normal limits and motor behavior appeared normal. Patient thought process is coherent. Patient  does not appear to be responding to internal stimuli. Patient was cooperative throughout the assessment and states that she is  Not agreeable to inpatient psychiatric treatment.   Patient seen for assessment on 01/26/2017 by Elmarie Shiley PMHNP-C. She was calm and cooperative. Patient continued to deny any active suicidal ideation stating "I got upset because my son did not call. I was waiting all day then just got more depressed. I was happy before Mother's Day came around. I don't feel like hurting myself. I feel ok now but last night was not in a good place. No I don't have a gun. I have never tried to hurt myself before. You can call my son if you want." Contacted her son at [facility] who reported that his mother  does not own a gun. During the conversation her son stated "I actually did call my mother at 10:30 am on Mother's Day. I could tell that she was drinking so I kept the conversation short. I had told her two weeks ago that I would not be coming to visit her  because she never was really a motherly figure. My wife has other people we can visit but I did call her. I have no concern that she will try to hurt herself if discharged from the hospital today." On admission her alcohol level was 233.   Past Psychiatric History: Depression, Possible alcohol abuse   Risk to Self: Suicidal Ideation: No Suicidal Intent: No Is patient at risk for suicide?: Yes Suicidal Plan?: No Access to Means: Yes Specify Access to Suicidal Means: access to sharps What has been your use of drugs/alcohol within the last 12 months?: none How many times?: 1 Other Self Harm Risks: none noted Triggers for Past Attempts: Unpredictable Intentional Self Injurious Behavior: None Risk to Others: Homicidal Ideation: No Thoughts of Harm to Others: No Current Homicidal Intent: No Current Homicidal Plan: No Access to Homicidal Means: No Identified Victim: none History of harm to others?: No Assessment of Violence: None Noted Violent Behavior Description: n/a Does patient have access to weapons?: No Criminal Charges Pending?: No Does patient have a court date: No Prior Inpatient Therapy: Prior Inpatient Therapy: Yes Prior Therapy Dates: unknown possible 2000/2006 Prior Therapy Facilty/Provider(s): [facility] Reason for Treatment: depression Prior Outpatient Therapy: Prior Outpatient Therapy: No Prior Therapy Dates: n/a Prior Therapy Facilty/Provider(s): n/a Reason for Treatment: n/a Does patient have an ACCT team?: No Does patient have Intensive In-House Services?  : No Does patient have [facility] services? : No Does patient have P4CC services?: No  Past Medical History:  Past Medical History:  Diagnosis Date  . Anxiety   . Chronic abdominal pain    resolved  . Chronic lymphocytic leukemia (CLL), B-cell ([place]) 04/04/2016  . CLL (chronic lymphocytic leukemia) ([place])   . H. pylori infection 2/09   s/p prevpac  . Hypertension   . Iron deficiency 11/17/2016  .  Lymphadenopathy 01/17/2016  . Nicotine addiction   . Psychosis    social worker states that patient does not like to talk about this and will stop her meds if you try and discuss this with her.    Past Surgical History:  Procedure Laterality Date  . AXILLARY LYMPH NODE BIOPSY Right 03/28/2016   Procedure: RIGHT AXILLARY LYMPH NODE BIOPSY;  Surgeon: Aviva Signs, MD;  Location: [facility];  Service: General;  Laterality: Right;  . CHOLECYSTECTOMY    . COLONOSCOPY  02/07/2011   SLF: Slightly tortuous colon.  Otherwise normal colon without evidence of polyps, masses, inflammatory changes, diverticular AVMs  . HAMMER TOE SURGERY Left   . HYSTEROSCOPY W/D&C N/A 07/30/2016   Procedure: DILATATION AND CURETTAGE /HYSTEROSCOPY;  Surgeon: Florian Buff, MD;  Location: [facility];  Service: Gynecology;  Laterality: N/A;  . Surgery of left middle finger on left hand (childhood )    . TUBAL LIGATION     Family History:  Family History  Problem Relation Age of Onset  . Cancer Mother    Family Psychiatric  History: Denies Social History:  History  Alcohol Use  . 0.0 oz/week    Comment: occ     History  Drug Use No    Social History   Social History  . Marital status: Single    Spouse name:  N/A  . Number of children: 2  . Years of education: N/A   Occupational History  . disabled    Social History Main Topics  . Smoking status: Light Tobacco Smoker    Packs/day: 0.25    Years: 16.00    Types: Cigarettes  . Smokeless tobacco: Never Used     Comment: 5 per day   . Alcohol use 0.0 oz/week     Comment: occ  . Drug use: No  . Sexual activity: No   Other Topics Concern  . None   Social History Narrative   Lives alone   Additional Social History:    Allergies:   Allergies  Allergen Reactions  . Feraheme [Ferumoxytol] Shortness Of Breath and Swelling    Labs:  Results for orders placed or performed during the hospital encounter of 01/25/17 (from the past 48 hour(s))  Rapid urine  drug screen (hospital performed)     Status: None   Collection Time: 01/25/17 10:58 PM  Result Value Ref Range   Opiates NONE DETECTED NONE DETECTED   Cocaine NONE DETECTED NONE DETECTED   Benzodiazepines NONE DETECTED NONE DETECTED   Amphetamines NONE DETECTED NONE DETECTED   Tetrahydrocannabinol NONE DETECTED NONE DETECTED   Barbiturates NONE DETECTED NONE DETECTED    Comment:        DRUG SCREEN FOR MEDICAL PURPOSES ONLY.  IF CONFIRMATION IS NEEDED FOR ANY PURPOSE, NOTIFY LAB WITHIN 5 DAYS.        LOWEST DETECTABLE LIMITS FOR URINE DRUG SCREEN Drug Class       Cutoff (ng/mL) Amphetamine      1000 Barbiturate      200 Benzodiazepine   856 Tricyclics       314 Opiates          300 Cocaine          300 THC              50   CBC with Differential     Status: Abnormal   Collection Time: 01/25/17 11:25 PM  Result Value Ref Range   WBC 13.5 (H) 4.0 - 10.5 K/uL   RBC 4.44 3.87 - 5.11 MIL/uL   Hemoglobin 13.1 12.0 - 15.0 g/dL   HCT 38.0 36.0 - 46.0 %   MCV 85.6 78.0 - 100.0 fL   MCH 29.5 26.0 - 34.0 pg   MCHC 34.5 30.0 - 36.0 g/dL   RDW 18.0 (H) 11.5 - 15.5 %   Platelets 249 150 - 400 K/uL   Neutrophils Relative % 21 %   Lymphocytes Relative 76 %   Monocytes Relative 2 %   Eosinophils Relative 1 %   Basophils Relative 0 %   Band Neutrophils 0 %   Metamyelocytes Relative 0 %   Myelocytes 0 %   Promyelocytes Absolute 0 %   Blasts 0 %   nRBC 0 0 /100 WBC   Other 0 %   Neutro Abs 2.8 1.7 - 7.7 K/uL   Lymphs Abs 10.3 (H) 0.7 - 4.0 K/uL   Monocytes Absolute 0.3 0.1 - 1.0 K/uL   Eosinophils Absolute 0.1 0.0 - 0.7 K/uL   Basophils Absolute 0.0 0.0 - 0.1 K/uL   WBC Morphology ATYPICAL LYMPHOCYTES   Basic metabolic panel     Status: None   Collection Time: 01/25/17 11:25 PM  Result Value Ref Range   Sodium 142 135 - 145 mmol/L   Potassium 3.7 3.5 - 5.1 mmol/L   Chloride 110 101 - 111  mmol/L   CO2 22 22 - 32 mmol/L   Glucose, Bld 97 65 - 99 mg/dL   BUN 14 6 - 20 mg/dL    Creatinine, Ser 0.67 0.44 - 1.00 mg/dL   Calcium 9.0 8.9 - 10.3 mg/dL   GFR calc non Af Amer >60 >60 mL/min   GFR calc Af Amer >60 >60 mL/min    Comment: (NOTE) The eGFR has been calculated using the CKD EPI equation. This calculation has not been validated in all clinical situations. eGFR's persistently <60 mL/min signify possible Chronic Kidney Disease.    Anion gap 10 5 - 15  Ethanol     Status: Abnormal   Collection Time: 01/25/17 11:29 PM  Result Value Ref Range   Alcohol, Ethyl (B) 233 (H) <5 mg/dL    Comment:        LOWEST DETECTABLE LIMIT FOR SERUM ALCOHOL IS 5 mg/dL FOR MEDICAL PURPOSES ONLY   Acetaminophen level     Status: Abnormal   Collection Time: 01/25/17 11:29 PM  Result Value Ref Range   Acetaminophen (Tylenol), Serum <10 (L) 10 - 30 ug/mL    Comment:        THERAPEUTIC CONCENTRATIONS VARY SIGNIFICANTLY. A RANGE OF 10-30 ug/mL MAY BE AN EFFECTIVE CONCENTRATION FOR MANY PATIENTS. HOWEVER, SOME ARE BEST TREATED AT CONCENTRATIONS OUTSIDE THIS RANGE. ACETAMINOPHEN CONCENTRATIONS >150 ug/mL AT 4 HOURS AFTER INGESTION AND >50 ug/mL AT 12 HOURS AFTER INGESTION ARE OFTEN ASSOCIATED WITH TOXIC REACTIONS.   Salicylate level     Status: None   Collection Time: 01/25/17 11:29 PM  Result Value Ref Range   Salicylate Lvl <0.6 2.8 - 30.0 mg/dL    No current facility-administered medications for this encounter.    Current Outpatient Prescriptions  Medication Sig Dispense Refill  . amLODipine (NORVASC) 10 MG tablet TAKE 1 TABLET BY MOUTH ONCE DAILY. 90 tablet 1  . naproxen sodium (ANAPROX) 220 MG tablet Take 440 mg by mouth 2 (two) times daily as needed (pain).    Marland Kitchen PARoxetine (PAXIL) 20 MG tablet TAKE 1 TABLET BY MOUTH ONCE A DAY. 90 tablet 1  . Vitamin D, Ergocalciferol, (DRISDOL) 50000 units CAPS capsule TAKE 1 CAPSULE BY MOUTH EVERY 7 DAYS. 4 capsule 0    Musculoskeletal:  Unable to assess via camera   Psychiatric Specialty Exam: Physical Exam  Review  of Systems  Psychiatric/Behavioral: Positive for depression. Negative for hallucinations, memory loss, substance abuse and suicidal ideas. The patient is not nervous/anxious and does not have insomnia.     Blood pressure 137/76, pulse 92, temperature 99.7 F (37.6 C), temperature source Oral, resp. rate 18, height '5\' 5"'$  (1.651 m), weight 70.8 kg (156 lb), last menstrual period 07/25/2016, SpO2 97 %.Body mass index is 25.96 kg/m.  General Appearance: Casual  Eye Contact:  Good  Speech:  Clear and Coherent  Volume:  Normal  Mood:  Euthymic  Affect:  Appropriate  Thought Process:  Coherent and Goal Directed  Orientation:  Full (Time, Place, and Person)  Thought Content:  WDL  Suicidal Thoughts:  No  Homicidal Thoughts:  No  Memory:  Immediate;   Good Recent;   Fair Remote;   Fair  Judgement:  Fair  Insight:  Fair  Psychomotor Activity:  Normal  Concentration:  Concentration: Good and Attention Span: Good  Recall:  Good  Fund of Knowledge:  Good  Language:  Good  Akathisia:  No  Handed:  Right  AIMS (if indicated):     Assets:  Communication  Skills Desire for Improvement Financial Resources/Insurance Housing Leisure Time Physical Health Resilience Social Support  ADL's:  Intact  Cognition:  WNL  Sleep:        Treatment Plan Summary: Patient stable to discharge home. She reports being stable on Paxil 20 mg daily prior to incident yesterday. Per collateral from son patient does not own a gun and is not determined to be a danger to herself at present.   Disposition: No evidence of imminent risk to self or others at present.   Patient does not meet criteria for psychiatric inpatient admission. Supportive therapy provided about ongoing stressors.  Elmarie Shiley, NP 01/26/2017 12:14 PM

## 2017-01-26 NOTE — ED Notes (Signed)
Taxi on the way.  Pt ambulatory out of ER without difficulty.

## 2017-02-16 ENCOUNTER — Ambulatory Visit (HOSPITAL_COMMUNITY): Payer: Medicaid Other | Admitting: Oncology

## 2017-02-16 ENCOUNTER — Other Ambulatory Visit (HOSPITAL_COMMUNITY): Payer: Medicaid Other

## 2017-02-18 ENCOUNTER — Other Ambulatory Visit (HOSPITAL_COMMUNITY): Payer: Medicaid Other

## 2017-02-18 ENCOUNTER — Ambulatory Visit (HOSPITAL_COMMUNITY): Payer: Medicaid Other | Admitting: Adult Health

## 2017-02-19 ENCOUNTER — Other Ambulatory Visit (HOSPITAL_COMMUNITY): Payer: Self-pay | Admitting: *Deleted

## 2017-02-19 DIAGNOSIS — C9110 Chronic lymphocytic leukemia of B-cell type not having achieved remission: Secondary | ICD-10-CM

## 2017-02-20 ENCOUNTER — Encounter (HOSPITAL_COMMUNITY): Payer: Medicaid Other | Attending: Hematology & Oncology

## 2017-02-20 ENCOUNTER — Encounter (HOSPITAL_COMMUNITY): Payer: Self-pay | Admitting: Adult Health

## 2017-02-20 ENCOUNTER — Encounter (HOSPITAL_BASED_OUTPATIENT_CLINIC_OR_DEPARTMENT_OTHER): Payer: Medicaid Other | Admitting: Adult Health

## 2017-02-20 VITALS — BP 132/86 | HR 87 | Temp 99.2°F | Resp 18 | Ht 65.0 in | Wt 155.0 lb

## 2017-02-20 DIAGNOSIS — E611 Iron deficiency: Secondary | ICD-10-CM

## 2017-02-20 DIAGNOSIS — Z720 Tobacco use: Secondary | ICD-10-CM | POA: Diagnosis not present

## 2017-02-20 DIAGNOSIS — C9110 Chronic lymphocytic leukemia of B-cell type not having achieved remission: Secondary | ICD-10-CM

## 2017-02-20 DIAGNOSIS — R600 Localized edema: Secondary | ICD-10-CM | POA: Diagnosis not present

## 2017-02-20 DIAGNOSIS — D509 Iron deficiency anemia, unspecified: Secondary | ICD-10-CM | POA: Diagnosis not present

## 2017-02-20 LAB — COMPREHENSIVE METABOLIC PANEL
ALT: 14 U/L (ref 14–54)
ANION GAP: 10 (ref 5–15)
AST: 23 U/L (ref 15–41)
Albumin: 4.1 g/dL (ref 3.5–5.0)
Alkaline Phosphatase: 173 U/L — ABNORMAL HIGH (ref 38–126)
BUN: 18 mg/dL (ref 6–20)
CALCIUM: 9.5 mg/dL (ref 8.9–10.3)
CO2: 24 mmol/L (ref 22–32)
CREATININE: 0.58 mg/dL (ref 0.44–1.00)
Chloride: 104 mmol/L (ref 101–111)
Glucose, Bld: 102 mg/dL — ABNORMAL HIGH (ref 65–99)
Potassium: 3.5 mmol/L (ref 3.5–5.1)
Sodium: 138 mmol/L (ref 135–145)
TOTAL PROTEIN: 8.1 g/dL (ref 6.5–8.1)
Total Bilirubin: 0.4 mg/dL (ref 0.3–1.2)

## 2017-02-20 LAB — CBC WITH DIFFERENTIAL/PLATELET
Basophils Absolute: 0 10*3/uL (ref 0.0–0.1)
Basophils Relative: 0 %
EOS ABS: 0.1 10*3/uL (ref 0.0–0.7)
Eosinophils Relative: 1 %
HEMATOCRIT: 37 % (ref 36.0–46.0)
HEMOGLOBIN: 12.5 g/dL (ref 12.0–15.0)
LYMPHS ABS: 5.5 10*3/uL — AB (ref 0.7–4.0)
LYMPHS PCT: 49 %
MCH: 29.8 pg (ref 26.0–34.0)
MCHC: 33.8 g/dL (ref 30.0–36.0)
MCV: 88.1 fL (ref 78.0–100.0)
MONOS PCT: 4 %
Monocytes Absolute: 0.4 10*3/uL (ref 0.1–1.0)
NEUTROS ABS: 5 10*3/uL (ref 1.7–7.7)
NEUTROS PCT: 46 %
Platelets: 255 10*3/uL (ref 150–400)
RBC: 4.2 MIL/uL (ref 3.87–5.11)
RDW: 17 % — ABNORMAL HIGH (ref 11.5–15.5)
WBC: 11.1 10*3/uL — ABNORMAL HIGH (ref 4.0–10.5)

## 2017-02-20 LAB — LACTATE DEHYDROGENASE: LDH: 210 U/L — ABNORMAL HIGH (ref 98–192)

## 2017-02-20 NOTE — Patient Instructions (Addendum)
[facility] at [facility] Discharge Instructions  RECOMMENDATIONS MADE BY THE CONSULTANT AND ANY TEST RESULTS WILL BE SENT TO YOUR REFERRING PHYSICIAN.  You were seen today by Gretchen Dawson NP. Return in 3 months for labs and follow up.   Thank you for choosing  Cancer Center at [facility] to provide your oncology and hematology care.  To afford each patient quality time with our provider, please arrive at least 15 minutes before your scheduled appointment time.    If you have a lab appointment with the Cancer Center please come in thru the  Main Entrance and check in at the main information desk  You need to re-schedule your appointment should you arrive 10 or more minutes late.  We strive to give you quality time with our providers, and arriving late affects you and other patients whose appointments are after yours.  Also, if you no show three or more times for appointments you may be dismissed from the clinic at the providers discretion.     Again, thank you for choosing [facility].  Our hope is that these requests will decrease the amount of time that you wait before being seen by our physicians.       _____________________________________________________________  Should you have questions after your visit to [facility], please contact our office at [phone] between the hours of 8:30 a.m. and 4:30 p.m.  Voicemails left after 4:30 p.m. will not be returned until the following business day.  For prescription refill requests, have your pharmacy contact our office.       Resources For Cancer Patients and their Caregivers ? American Cancer Society: Can assist with transportation, wigs, general needs, runs Look Good Feel Better.        1-888-227-6333 ? Cancer Care: Provides financial assistance, online support groups, medication/co-pay assistance.  1-800-813-HOPE (4673) ? [facility] Assists [place] cancer patients and their families through emotional , educational and financial support.  336-427-4357 ? [place] DSS Where to apply for food stamps, Medicaid and utility assistance. 336-342-1394 ? [facility]: Transportation to medical appointments. 336-347-2287 ? Social Security Administration: May apply for disability if have a Stage IV cancer. 336-342-7796 1-800-772-1213 ? [place] Aging, Disability and Transit Services: Assists with nutrition, care and transit needs. 336-349-2343  Cancer Center Support Programs: @10RELATIVEDAYS@ > Cancer Support Group  2nd Tuesday of the month 1pm-2pm, Journey Room  > Creative Journey  3rd Tuesday of the month 1130am-1pm, Journey Room  > Look Good Feel Better  1st Wednesday of the month 10am-12 noon, Journey Room (Call American Cancer Society to register 1-800-395-5775)    

## 2017-02-23 NOTE — Progress Notes (Signed)
[name], [place] [id number]   CLINIC:  Medical Oncology/Hematology  PCP:  Fayrene Helper, MD [address], [place] [address] [phone]   REASON FOR VISIT:  Follow-up for B-cell CLL/SLL  CURRENT THERAPY: Ongoing surveillance; no treatment at present; monitoring for treatment criteria    INTERVAL HISTORY:  [name] 56 y.o. female returns to cancer center for continued follow-up for B-cell CLL.   Overall, she tells me she is feeling good. Her appetite and energy levels are 100%. She remains independent in her daily living.   Since her last visit to the cancer center, she did have ED visit with suicidal ideation. She goes to the adult center in [place] for additional support. Her case worker/social worker is here with her today as well.  She denies current suicidal ideation. She is continuing to work on her anxiety and depression.   She was found to be iron deficient in 11/2016. She came to the cancer center of IV iron infusion, but had hypersensitivity reaction and did not receive infusion. Denies any active bleeding episodes including blood in her stools, dark/tarry stools, hematuria, vaginal bleeding, nosebleeds, or gingival bleeding.   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 She has bilateral LE swelling, which has been chronic.   Denies any fevers. Endorses chills, "but it's because I"m always cold."  Denies severe fatigue, weight loss, or night sweats.  She thinks the "lump" on her right neck has gotten bigger since we saw her last.    She continues to smoke cigarettes, about 1/2 ppd. She used nicotine patches in the past, which was helpful in her cutting down on her smoking. She is interested in quitting, but "it is hard because I am always around people who smoke and it makes me want to smoke too."     REVIEW OF SYSTEMS:  Review of Systems  Constitutional: Positive for chills. Negative for appetite  change, diaphoresis, fatigue, fever and unexpected weight change.  HENT:   Positive for lump/mass.   Eyes: Negative.   Respiratory: Negative.  Negative for cough and shortness of breath.   Cardiovascular: Positive for leg swelling.  Gastrointestinal: Negative.  Negative for abdominal pain, constipation, diarrhea, nausea and vomiting.  Endocrine: Negative.   Genitourinary: Negative.  Negative for dysuria, hematuria and vaginal bleeding.   Musculoskeletal: Negative.   Neurological: Positive for numbness. Negative for dizziness and headaches.  Hematological: Negative.   Psychiatric/Behavioral: Positive for depression. Negative for suicidal ideas. The patient is nervous/anxious.      PAST MEDICAL/SURGICAL HISTORY:  Past Medical History:  Diagnosis Date  . Anxiety   . Chronic abdominal pain    resolved  . Chronic lymphocytic leukemia (CLL), B-cell ([facility]) 04/04/2016  . CLL (chronic lymphocytic leukemia) ([facility])   . H. pylori infection 2/09   s/p prevpac  . Hypertension   . Iron deficiency 11/17/2016  . Lymphadenopathy 01/17/2016  . Nicotine addiction   . Psychosis    social worker states that patient does not like to talk about this and will stop her meds if you try and discuss this with her.   Past Surgical History:  Procedure Laterality Date  . AXILLARY LYMPH NODE BIOPSY Right 03/28/2016   Procedure: RIGHT AXILLARY LYMPH NODE BIOPSY;  Surgeon: Aviva Signs, MD;  Location: [facility];  Service: General;  Laterality: Right;  . CHOLECYSTECTOMY    . COLONOSCOPY  02/07/2011   SLF: Slightly tortuous colon.  Otherwise normal colon  without evidence of polyps, masses, inflammatory changes, diverticular AVMs  . HAMMER TOE SURGERY Left   . HYSTEROSCOPY W/D&C N/A 07/30/2016   Procedure: DILATATION AND CURETTAGE /HYSTEROSCOPY;  Surgeon: Florian Buff, MD;  Location: [facility];  Service: Gynecology;  Laterality: N/A;  . Surgery of left middle finger on left hand (childhood )    . TUBAL LIGATION        SOCIAL HISTORY:  Social History   Social History  . Marital status: Single    Spouse name: N/A  . Number of children: 2  . Years of education: N/A   Occupational History  . disabled    Social History Main Topics  . Smoking status: Light Tobacco Smoker    Packs/day: 0.25    Years: 16.00    Types: Cigarettes  . Smokeless tobacco: Never Used     Comment: 5 per day   . Alcohol use 0.0 oz/week     Comment: occ  . Drug use: No  . Sexual activity: No   Other Topics Concern  . Not on file   Social History Narrative   Lives alone    FAMILY HISTORY:  Family History  Problem Relation Age of Onset  . Cancer Mother     CURRENT MEDICATIONS:  Outpatient Encounter Prescriptions as of 02/20/2017  Medication Sig  . amLODipine (NORVASC) 10 MG tablet TAKE 1 TABLET BY MOUTH ONCE DAILY.  . naproxen sodium (ANAPROX) 220 MG tablet Take 440 mg by mouth 2 (two) times daily as needed (pain).  Marland Kitchen PARoxetine (PAXIL) 20 MG tablet TAKE 1 TABLET BY MOUTH ONCE A DAY.  Marland Kitchen Vitamin D, Ergocalciferol, (DRISDOL) 50000 units CAPS capsule TAKE 1 CAPSULE BY MOUTH EVERY 7 DAYS.   No facility-administered encounter medications on file as of 02/20/2017.     ALLERGIES:  Allergies  Allergen Reactions  . Feraheme [Ferumoxytol] Shortness Of Breath and Swelling     PHYSICAL EXAM:  ECOG Performance status: 1 - Symptomatic; remains independent   Vitals:   02/20/17 0945  BP: 132/86  Pulse: 87  Resp: 18  Temp: 99.2 F (37.3 C)   Filed Weights   02/20/17 0945  Weight: 155 lb (70.3 kg)    Physical Exam  Constitutional: She is oriented to person, place, and time and well-developed, well-nourished, and in no distress.  HENT:  Head: Normocephalic.  Mouth/Throat: Oropharynx is clear and moist. No oropharyngeal exudate.  Eyes: Conjunctivae are normal. Pupils are equal, round, and reactive to light. No scleral icterus.  Neck: Normal range of motion.  Cardiovascular: Normal rate and regular rhythm.    Pulmonary/Chest: Effort normal. No respiratory distress. She has wheezes (mild expiratory wheezes).  Abdominal: Soft. Bowel sounds are normal. There is no tenderness. There is no rebound and no guarding.  Musculoskeletal: She exhibits edema (1-2+ BLE edema; left mildly worse than right ).  Lymphadenopathy:    She has cervical adenopathy.    She has no axillary adenopathy.       Right: Inguinal and supraclavicular adenopathy present.       Left: Inguinal and supraclavicular adenopathy present.  -Bilat cervical adenopathy appreciated on exam; large right cervical LN at level 3, measuring about 2 cm.  -Several areas of bilat supraclavicular lymphadenopathy palpable, all 1 cm or less.  -No axillary adenopathy appreciated.  -Bilat inguinal lymphadenopathy; largest measuring about 1-2 cm.   Neurological: She is alert and oriented to person, place, and time. No cranial nerve deficit. Gait normal.  Skin: Skin is warm and  dry.  Psychiatric: Mood and affect normal.  Cognitive delay (chronic)  Nursing note and vitals reviewed.    LABORATORY DATA:  I have reviewed the labs as listed.  CBC    Component Value Date/Time   WBC 11.1 (H) 02/20/2017 0857   RBC 4.20 02/20/2017 0857   HGB 12.5 02/20/2017 0857   HCT 37.0 02/20/2017 0857   PLT 255 02/20/2017 0857   MCV 88.1 02/20/2017 0857   MCH 29.8 02/20/2017 0857   MCHC 33.8 02/20/2017 0857   RDW 17.0 (H) 02/20/2017 0857   LYMPHSABS 5.5 (H) 02/20/2017 0857   MONOABS 0.4 02/20/2017 0857   EOSABS 0.1 02/20/2017 0857   BASOSABS 0.0 02/20/2017 0857   CMP Latest Ref Rng & Units 02/20/2017 01/25/2017 11/14/2016  Glucose 65 - 99 mg/dL 102(H) 97 113(H)  BUN 6 - 20 mg/dL 18 14 19   Creatinine 0.44 - 1.00 mg/dL 0.58 0.67 0.73  Sodium 135 - 145 mmol/L 138 142 142  Potassium 3.5 - 5.1 mmol/L 3.5 3.7 3.4(L)  Chloride 101 - 111 mmol/L 104 110 109  CO2 22 - 32 mmol/L 24 22 25   Calcium 8.9 - 10.3 mg/dL 9.5 9.0 9.2  Total Protein 6.5 - 8.1 g/dL 8.1 - 7.6   Total Bilirubin 0.3 - 1.2 mg/dL 0.4 - 0.2(L)  Alkaline Phos 38 - 126 U/L 173(H) - 173(H)  AST 15 - 41 U/L 23 - 21  ALT 14 - 54 U/L 14 - 13(L)    PENDING LABS:    DIAGNOSTIC IMAGING:  *The following radiologic images and reports have been reviewed independently and agree with below findings.  CT chest/abd/pelvis: 02/14/16       PATHOLOGY:  (R) axilla LN biopsy: 03/28/16       ASSESSMENT & PLAN:   B-cell CLL/SLL:  -Diagnosed in 01/2016. -WBCs only mildly elevated at 11.1 today. Hgb and platelets preserved and normal. Reviewed these lab results with patient today in detail.  -On physical exam, adenopathy to neck and groin may be progressed. Clinically, she denies any severe fatigue, night sweats, or fever without infection. No weight loss; she has actually gained about 5 lbs in the past 2 months.  -Shared with her that she continues to not meet criteria for treatment at this time; explained that we will continue to closely monitor her symptoms and lab work over time.   -Return to cancer center in 3 months with labs.   NCCN guidelines for indication for treatment of CLL are:  A. Eligible for clinical trial  B. Significant disease-related symptoms   1. Fatigue (severe)   2. Night sweats   3. Weight loss   4. Fever without infection  C. Threatened end-organ function  D. Progressive bulky disease (spleen >6cm below costal margin, lymph nodes >10 cm)  E. Progressive anemia  F. Progressive thrombocytopenia.   Iron deficiency anemia:  -She had partial iron infusion on 12/01/16, but unfortunately experienced a hypersensitivity reaction during the infusion and did not receive the complete dose of IV iron medication; Feraheme was subsequently added to her allergy list.  -Hemoglobin is normal today; clinically she denies fatigue. Will add on iron studies to her labs in 3 months. She may tolerate IV ferric gluconate better in the future, if needed.   LE edema:  -While she does have  inguinal adenopathy, the lymph nodes that are palpable are not >2 cm. She has also had (L) knee injury in recent months (CT left knee 09/16/16 indicating tibial fracture with possible ligament injury  as well). Also had left LE doppler ultrasound which was negative for DVT on 10/07/16. Therefore, I have low clinical suspicion that her edema is secondary to the malignancy.  -She does have bilateral LE edema; left is mildly worse than right.  -Recommended she wear compression stockings more regularly (she already has a pair). Follow-up with PCP, as needed, for management.   Tobacco use disorder:  -We discussed the pathophysiology of nicotine dependence and different strategies to stop smoking. The gold standard of tobacco cessation is nicotine replacement (with nicotine patches & gum/lozenges) or Varenicline (Chantix).  Based on the current psychiatric/emotional struggles the patient is recovering from, I would not recommend Chantix at this time.  I would however recommend nicotine replacement with patches, and lozenges/gym for cravings/urges to smoke.  I gave her instructions on how to use these nicotine replacement products. I also gave her the phone number to the [facility] (1-800-QUIT-NOW) which may be able to provide additional resources to her, free of charge, as well.   LoriPacha  understands that data suggests that "cold [place]" is the least effective way to stop using tobacco products.  Having a clear "quit plan" is much more effective and requires a step-wise approach with continued support from a tobacco treatment specialist.  I encouraged her to reach out to me if she has further questions or interest in her continued cessation efforts.  Greater than 10 minutes was spent in smoking cessation counseling with this patient.     Depression/Anxiety:  -Recent ED visit for suicidal ideation. She is here today with her case worker. Ms. Montalvo remains active with the support resources available in [place], [place].  Denies current suicidal ideation or plan.  -Recommended she seek out additional emotional/psychiatric help as directed.       Dispo:  -Return to cancer center in 3 months with labs (CBC with diff, CMET, LDH, iron studies).    All questions were answered to patient's stated satisfaction. Encouraged patient to call with any new concerns or questions before her next visit to the cancer center and we can certain see her sooner, if needed.    Plan of care discussed with Dr. Irene Limbo, who agrees with the above aforementioned.    A total of 30 minutes was spent in face-to-face care of this patient, with greater than 50% of that time spent in counseling and care-coordination.     Mike Craze, NP [place] [phone]    Orders placed this encounter:  Orders Placed This Encounter  Procedures  . CBC with Differential/Platelet  . Comprehensive metabolic panel  . Lactate dehydrogenase  . Ferritin  . Iron and TIBC      Mike Craze, NP [place] [phone]

## 2017-03-02 ENCOUNTER — Ambulatory Visit (HOSPITAL_COMMUNITY): Payer: Medicaid Other

## 2017-03-19 ENCOUNTER — Ambulatory Visit (HOSPITAL_COMMUNITY)
Admission: RE | Admit: 2017-03-19 | Discharge: 2017-03-19 | Disposition: A | Discharge status: Home / Self Care | Payer: Medicaid Other | Source: Ambulatory Visit | Attending: Family Medicine | Admitting: Family Medicine

## 2017-03-19 DIAGNOSIS — Z1231 Encounter for screening mammogram for malignant neoplasm of breast: Secondary | ICD-10-CM | POA: Diagnosis present

## 2017-03-19 DIAGNOSIS — R590 Localized enlarged lymph nodes: Secondary | ICD-10-CM | POA: Insufficient documentation

## 2017-03-19 DIAGNOSIS — R928 Other abnormal and inconclusive findings on diagnostic imaging of breast: Secondary | ICD-10-CM | POA: Diagnosis not present

## 2017-03-19 DIAGNOSIS — N6489 Other specified disorders of breast: Secondary | ICD-10-CM | POA: Insufficient documentation

## 2017-04-30 ENCOUNTER — Other Ambulatory Visit: Payer: Self-pay | Admitting: Family Medicine

## 2017-05-11 LAB — LIPID PANEL
Cholesterol: 181 mg/dL (ref <200)
HDL: 65 mg/dL (ref >50)
LDL CALC: 104 mg/dL — AB (ref <100)
Total CHOL/HDL Ratio: 2.8 Ratio (ref <5.0)
Triglycerides: 62 mg/dL (ref <150)
VLDL: 12 mg/dL (ref <30)

## 2017-05-11 LAB — COMPLETE METABOLIC PANEL WITH GFR
ALT: 13 U/L (ref 6–29)
AST: 23 U/L (ref 10–35)
Albumin: 4.2 g/dL (ref 3.6–5.1)
Alkaline Phosphatase: 149 U/L — ABNORMAL HIGH (ref 33–130)
BUN: 11 mg/dL (ref 7–25)
CHLORIDE: 107 mmol/L (ref 98–110)
CO2: 24 mmol/L (ref 20–32)
Calcium: 9.3 mg/dL (ref 8.6–10.4)
Creat: 0.68 mg/dL (ref 0.50–1.05)
GFR, Est African American: >89 mL/min (ref >=60)
Glucose, Bld: 88 mg/dL (ref 65–99)
POTASSIUM: 3.9 mmol/L (ref 3.5–5.3)
SODIUM: 140 mmol/L (ref 135–146)
TOTAL PROTEIN: 7.2 g/dL (ref 6.1–8.1)
Total Bilirubin: 0.4 mg/dL (ref 0.2–1.2)

## 2017-05-12 LAB — VITAMIN D 25 HYDROXY (VIT D DEFICIENCY, FRACTURES): VIT D 25 HYDROXY: 33 ng/mL (ref 30–100)

## 2017-05-12 LAB — TSH: TSH: 1.32 mIU/L

## 2017-05-20 ENCOUNTER — Encounter: Payer: Self-pay | Admitting: Family Medicine

## 2017-05-20 ENCOUNTER — Ambulatory Visit (INDEPENDENT_AMBULATORY_CARE_PROVIDER_SITE_OTHER): Payer: Medicaid Other | Admitting: Family Medicine

## 2017-05-20 VITALS — BP 140/88 | HR 74 | Temp 97.5°F | Resp 16 | Ht 65.0 in | Wt 165.8 lb

## 2017-05-20 DIAGNOSIS — I10 Essential (primary) hypertension: Secondary | ICD-10-CM

## 2017-05-20 DIAGNOSIS — F17200 Nicotine dependence, unspecified, uncomplicated: Secondary | ICD-10-CM | POA: Diagnosis not present

## 2017-05-20 DIAGNOSIS — F418 Other specified anxiety disorders: Secondary | ICD-10-CM | POA: Diagnosis not present

## 2017-05-20 DIAGNOSIS — Z23 Encounter for immunization: Secondary | ICD-10-CM

## 2017-05-20 NOTE — Patient Instructions (Addendum)
F/u in 3.5 month, with rectal call if you need me before  It is important that you exercise regularly at least 30 minutes 5 times a week. If you develop chest pain, have severe difficulty breathing, or feel very tired, stop exercising immediately and seek medical attention   Please stop buying sodas and sweet drinks and commit to daily exercise   Flu vaccine today  Important to take medications as prescribed  pls continue to commit to stopping smoking      Steps to Quit Smoking Smoking tobacco can be bad for your health. It can also affect almost every organ in your body. Smoking puts you and people around you at risk for many serious long-lasting (chronic) diseases. Quitting smoking is hard, but it is one of the best things that you can do for your health. It is never too late to quit. What are the benefits of quitting smoking? When you quit smoking, you lower your risk for getting serious diseases and conditions. They can include:  Lung cancer or lung disease.  Heart disease.  Stroke.  Heart attack.  Not being able to have children (infertility).  Weak bones (osteoporosis) and broken bones (fractures).  If you have coughing, wheezing, and shortness of breath, those symptoms may get better when you quit. You may also get sick less often. If you are pregnant, quitting smoking can help to lower your chances of having a baby of low birth weight. What can I do to help me quit smoking? Talk with your doctor about what can help you quit smoking. Some things you can do (strategies) include:  Quitting smoking totally, instead of slowly cutting back how much you smoke over a period of time.  Going to in-person counseling. You are more likely to quit if you go to many counseling sessions.  Using resources and support systems, such as: ? Database administrator with a Social worker. ? Phone quitlines. ? Careers information officer. ? Support groups or group counseling. ? Text messaging  programs. ? Mobile phone apps or applications.  Taking medicines. Some of these medicines may have nicotine in them. If you are pregnant or breastfeeding, do not take any medicines to quit smoking unless your doctor says it is okay. Talk with your doctor about counseling or other things that can help you.  Talk with your doctor about using more than one strategy at the same time, such as taking medicines while you are also going to in-person counseling. This can help make quitting easier. What things can I do to make it easier to quit? Quitting smoking might feel very hard at first, but there is a lot that you can do to make it easier. Take these steps:  Talk to your family and friends. Ask them to support and encourage you.  Call phone quitlines, reach out to support groups, or work with a Social worker.  Ask people who smoke to not smoke around you.  Avoid places that make you want (trigger) to smoke, such as: ? Bars. ? Parties. ? Smoke-break areas at work.  Spend time with people who do not smoke.  Lower the stress in your life. Stress can make you want to smoke. Try these things to help your stress: ? Getting regular exercise. ? Deep-breathing exercises. ? Yoga. ? Meditating. ? Doing a body scan. To do this, close your eyes, focus on one area of your body at a time from head to toe, and notice which parts of your body are tense. Try to  relax the muscles in those areas.  Download or buy apps on your mobile phone or tablet that can help you stick to your quit plan. There are many free apps, such as QuitGuide from the [employer] Office manager for Disease Control and Prevention). You can find more support from smokefree.gov and other websites.  This information is not intended to replace advice given to you by your health care provider. Make sure you discuss any questions you have with your health care provider. Document Released: 06/28/2009 Document Revised: 04/29/2016 Document Reviewed:  01/16/2015 Elsevier Interactive Patient Education  2018 [employer].

## 2017-05-24 NOTE — Assessment & Plan Note (Signed)
Controlled, no change in medication  

## 2017-05-24 NOTE — Progress Notes (Signed)
[name]     MRN: [id number]      DOB: 27-Oct-1960   HPI [name] is here for follow up and re-evaluation of chronic medical conditions, medication management and review of any available recent lab and radiology data.  Preventive health is updated, specifically  Cancer screening and Immunization.   Recently in the ED for suicidal ideation when she had statred drinking and had stopped taking her meds  The PT denies any adverse reactions to current medications since the last visit.   There are no specific complaints   ROS Denies recent fever or chills. Denies sinus pressure, nasal congestion, ear pain or sore throat. Denies chest congestion, productive cough or wheezing. Denies chest pains, palpitations and leg swelling Denies abdominal pain, nausea, vomiting,diarrhea or constipation.   Denies dysuria, frequency, hesitancy or incontinence. Denies joint pain, swelling and limitation in mobility. Denies headaches, seizures, numbness, or tingling. Denies  Uncontrolled  depression, anxiety or insomnia. Denies skin break down or rash.   PE  BP 140/88 (BP Location: Left Arm, Patient Position: Sitting, Cuff Size: Normal)   Pulse 74   Temp (!) 97.5 F (36.4 C) (Other (Comment))   Resp 16   Ht 5\' 5"  (1.651 m)   Wt 165 lb 12 oz (75.2 kg)   LMP 07/25/2016   SpO2 99%   BMI 27.58 kg/m   Patient alert and oriented and in no cardiopulmonary distress.  HEENT: No facial asymmetry, EOMI,   oropharynx pink and moist.  Neck supple no JVD, no mass.  Chest: Clear to auscultation bilaterally.Decreased though adequate air entry   CVS: S1, S2 no murmurs, no S3.Regular rate.  ABD: Soft non tender.   Ext: No edema  MS: Adequate ROM spine, shoulders, hips and knees.  Skin: Intact, no ulcerations or rash noted.  Psych: Good eye contact, normal affect. Memory intact not anxious or depressed appearing.  CNS: CN 2-12 intact, power,  normal throughout.no focal deficits noted.   Assessment  & Plan  Essential hypertension Controlled, no change in medication DASH diet and commitment to daily physical activity for a minimum of 30 minutes discussed and encouraged, as a part of hypertension management. The importance of attaining a healthy weight is also discussed.  BP/Weight 05/20/2017 02/20/2017 01/26/2017 01/25/2017 01/14/2017 10/11/5168 0/09/7492  Systolic BP 496 759 163 - 846 659 935  Diastolic BP 88 86 79 - 82 77 101  Wt. (Lbs) 165.75 155 - 156 156.4 - 150  BMI 27.58 25.79 - 25.96 26.03 - 24.96       NICOTINE ADDICTION Patient is asked and  confirms current  Nicotine use.  Five to seven minutes of time is spent in counseling the patient of the need to quit smoking  Advice to quit is delivered clearly specifically in reducing the risk of developing heart disease, having a stroke, or of developing all types of cancer, especially lung and oral cancer. Improvement in breathing and exercise tolerance and quality of life is also discussed, as is the economic benefit.  Assessment of willingness to quit or to make an attempt to quit is made and documented  Assistance in quit attempt is made with several and varied options presented, based on patient's desire and need. These include  literature, local classes available, 1800 QUIT NOW number, OTC and prescription medication.  The GOAL to be NICOTINE FREE is re emphasized.  The patient has set a personal goal of either reduction or discontinuation and follow up is arranged between 6  an 16 weeks.    Depression with anxiety Controlled, no change in medication

## 2017-05-24 NOTE — Assessment & Plan Note (Signed)
Controlled, no change in medication DASH diet and commitment to daily physical activity for a minimum of 30 minutes discussed and encouraged, as a part of hypertension management. The importance of attaining a healthy weight is also discussed.  BP/Weight 05/20/2017 02/20/2017 01/26/2017 01/25/2017 01/14/2017 04/16/2178 04/15/253  Systolic BP 862 824 175 - 301 040 459  Diastolic BP 88 86 79 - 82 77 101  Wt. (Lbs) 165.75 155 - 156 156.4 - 150  BMI 27.58 25.79 - 25.96 26.03 - 24.96

## 2017-05-24 NOTE — Assessment & Plan Note (Signed)

## 2017-05-25 ENCOUNTER — Encounter (HOSPITAL_COMMUNITY): Payer: Medicaid Other | Attending: Oncology

## 2017-05-25 ENCOUNTER — Encounter (HOSPITAL_COMMUNITY): Payer: Medicaid Other

## 2017-05-25 DIAGNOSIS — C9110 Chronic lymphocytic leukemia of B-cell type not having achieved remission: Secondary | ICD-10-CM | POA: Diagnosis present

## 2017-05-25 DIAGNOSIS — E611 Iron deficiency: Secondary | ICD-10-CM

## 2017-05-25 DIAGNOSIS — E661 Drug-induced obesity: Secondary | ICD-10-CM | POA: Diagnosis not present

## 2017-05-25 LAB — CBC WITH DIFFERENTIAL/PLATELET
BASOS PCT: 0 %
Basophils Absolute: 0 10*3/uL (ref 0.0–0.1)
EOS PCT: 1 %
Eosinophils Absolute: 0.1 10*3/uL (ref 0.0–0.7)
HEMATOCRIT: 35.3 % — AB (ref 36.0–46.0)
Hemoglobin: 11.9 g/dL — ABNORMAL LOW (ref 12.0–15.0)
LYMPHS ABS: 6.2 10*3/uL — AB (ref 0.7–4.0)
Lymphocytes Relative: 54 %
MCH: 30.7 pg (ref 26.0–34.0)
MCHC: 33.7 g/dL (ref 30.0–36.0)
MCV: 91 fL (ref 78.0–100.0)
MONO ABS: 0.6 10*3/uL (ref 0.1–1.0)
MONOS PCT: 5 %
NEUTROS PCT: 40 %
Neutro Abs: 4.6 10*3/uL (ref 1.7–7.7)
PLATELETS: 218 10*3/uL (ref 150–400)
RBC: 3.88 MIL/uL (ref 3.87–5.11)
RDW: 16.3 % — ABNORMAL HIGH (ref 11.5–15.5)
WBC: 11.5 10*3/uL — AB (ref 4.0–10.5)

## 2017-05-25 LAB — FERRITIN: FERRITIN: 37 ng/mL (ref 11–307)

## 2017-05-25 LAB — COMPREHENSIVE METABOLIC PANEL
ALT: 14 U/L (ref 14–54)
AST: 25 U/L (ref 15–41)
Albumin: 4.1 g/dL (ref 3.5–5.0)
Alkaline Phosphatase: 141 U/L — ABNORMAL HIGH (ref 38–126)
Anion gap: 10 (ref 5–15)
BILIRUBIN TOTAL: 0.3 mg/dL (ref 0.3–1.2)
BUN: 17 mg/dL (ref 6–20)
CHLORIDE: 106 mmol/L (ref 101–111)
CO2: 24 mmol/L (ref 22–32)
Calcium: 9.4 mg/dL (ref 8.9–10.3)
Creatinine, Ser: 0.59 mg/dL (ref 0.44–1.00)
Glucose, Bld: 100 mg/dL — ABNORMAL HIGH (ref 65–99)
Potassium: 3.3 mmol/L — ABNORMAL LOW (ref 3.5–5.1)
Sodium: 140 mmol/L (ref 135–145)
TOTAL PROTEIN: 7.6 g/dL (ref 6.5–8.1)

## 2017-05-25 LAB — IRON AND TIBC
Iron: 32 ug/dL (ref 28–170)
Saturation Ratios: 8 % — ABNORMAL LOW (ref 10.4–31.8)
TIBC: 406 ug/dL (ref 250–450)
UIBC: 374 ug/dL

## 2017-05-25 LAB — LACTATE DEHYDROGENASE: LDH: 209 U/L — AB (ref 98–192)

## 2017-05-26 ENCOUNTER — Encounter (HOSPITAL_COMMUNITY): Payer: Self-pay

## 2017-05-26 ENCOUNTER — Encounter (HOSPITAL_BASED_OUTPATIENT_CLINIC_OR_DEPARTMENT_OTHER): Payer: Medicaid Other | Admitting: Oncology

## 2017-05-26 VITALS — BP 115/59 | HR 95 | Resp 16 | Ht 65.0 in | Wt 168.5 lb

## 2017-05-26 DIAGNOSIS — R600 Localized edema: Secondary | ICD-10-CM

## 2017-05-26 DIAGNOSIS — C9110 Chronic lymphocytic leukemia of B-cell type not having achieved remission: Secondary | ICD-10-CM | POA: Diagnosis not present

## 2017-05-26 DIAGNOSIS — F418 Other specified anxiety disorders: Secondary | ICD-10-CM | POA: Diagnosis not present

## 2017-05-26 DIAGNOSIS — E611 Iron deficiency: Secondary | ICD-10-CM

## 2017-05-26 DIAGNOSIS — E876 Hypokalemia: Secondary | ICD-10-CM

## 2017-05-26 MED ORDER — FERROUS SULFATE 325 (65 FE) MG PO TBEC: 325.0000 mg | DELAYED_RELEASE_TABLET | Freq: Two times a day (BID) | ORAL | 5 refills | Status: DC

## 2017-05-26 MED ORDER — FUROSEMIDE 20 MG PO TABS: 40.0000 mg | ORAL_TABLET | Freq: Every day | ORAL | 5 refills | Status: DC

## 2017-05-26 MED ORDER — POTASSIUM CHLORIDE ER 20 MEQ PO TBCR: 20.0000 meq | EXTENDED_RELEASE_TABLET | Freq: Every day | ORAL | 5 refills | Status: DC

## 2017-05-26 NOTE — Progress Notes (Signed)
[place], [place] [address]   CLINIC:  Medical Oncology/Hematology  PCP:  Fayrene Helper, MD [address] [place] [address] [phone]   REASON FOR VISIT:  Follow-up for B-cell CLL/SLL  CURRENT THERAPY: Ongoing surveillance; no treatment at present; monitoring for treatment criteria    INTERVAL HISTORY:  [name] 56 y.o. female returns to cancer center for continued follow-up for B-cell CLL with her social worker.   Patient states that she has been doing well except that she has a lot of burning and pain in her fingers. She states that her energy level is good. She denies palpating any new bulky lymph nodes. She denies any chest pain, shortness breath, abdominal pain, focal weakness. She denies any hematuria, melena, hematochezia, dysuria. She denies any drenching night sweats or unexplained weight loss.   REVIEW OF SYSTEMS:  Review of Systems  Constitutional: Negative for appetite change, chills, diaphoresis, fatigue, fever and unexpected weight change.  HENT:   Negative for lump/mass.   Eyes: Negative.   Respiratory: Negative.  Negative for cough and shortness of breath.   Cardiovascular: Negative for leg swelling (chronic).  Gastrointestinal: Negative.  Negative for abdominal pain, constipation, diarrhea, nausea and vomiting.  Endocrine: Negative.   Genitourinary: Negative.  Negative for dysuria, hematuria and vaginal bleeding.   Musculoskeletal: Negative.   Neurological: Negative for dizziness, headaches and numbness.  Hematological: Negative.   Psychiatric/Behavioral: Positive for depression. Negative for suicidal ideas. The patient is nervous/anxious.      PAST MEDICAL/SURGICAL HISTORY:  Past Medical History:  Diagnosis Date  . Anxiety   . Chronic abdominal pain    resolved  . Chronic lymphocytic leukemia (CLL), B-cell ([facility]) 04/04/2016  . CLL (chronic lymphocytic leukemia) ([facility])   . H. pylori  infection 2/09   s/p prevpac  . Hypertension   . Iron deficiency 11/17/2016  . Lymphadenopathy 01/17/2016  . Nicotine addiction   . Psychosis    social worker states that patient does not like to talk about this and will stop her meds if you try and discuss this with her.   Past Surgical History:  Procedure Laterality Date  . AXILLARY LYMPH NODE BIOPSY Right 03/28/2016   Procedure: RIGHT AXILLARY LYMPH NODE BIOPSY;  Surgeon: Aviva Signs, MD;  Location: [facility];  Service: General;  Laterality: Right;  . CHOLECYSTECTOMY    . COLONOSCOPY  02/07/2011   SLF: Slightly tortuous colon.  Otherwise normal colon without evidence of polyps, masses, inflammatory changes, diverticular AVMs  . HAMMER TOE SURGERY Left   . HYSTEROSCOPY W/D&C N/A 07/30/2016   Procedure: DILATATION AND CURETTAGE /HYSTEROSCOPY;  Surgeon: Florian Buff, MD;  Location: [facility];  Service: Gynecology;  Laterality: N/A;  . Surgery of left middle finger on left hand (childhood )    . TUBAL LIGATION       SOCIAL HISTORY:  Social History   Social History  . Marital status: Single    Spouse name: N/A  . Number of children: 2  . Years of education: N/A   Occupational History  . disabled    Social History Main Topics  . Smoking status: Light Tobacco Smoker    Packs/day: 0.25    Years: 16.00    Types: Cigarettes  . Smokeless tobacco: Never Used     Comment: 5 per day   . Alcohol use 0.0 oz/week     Comment: occ  . Drug use: No  . Sexual activity:  No   Other Topics Concern  . Not on file   Social History Narrative   Lives alone    FAMILY HISTORY:  Family History  Problem Relation Age of Onset  . Cancer Mother     CURRENT MEDICATIONS:  Outpatient Encounter Prescriptions as of 05/26/2017  Medication Sig  . amLODipine (NORVASC) 10 MG tablet TAKE 1 TABLET BY MOUTH ONCE DAILY.  . naproxen sodium (ANAPROX) 220 MG tablet Take 440 mg by mouth 2 (two) times daily as needed (pain).  Marland Kitchen PARoxetine (PAXIL) 20 MG tablet  TAKE 1 TABLET BY MOUTH ONCE A DAY.  Marland Kitchen Vitamin D, Ergocalciferol, (DRISDOL) 50000 units CAPS capsule TAKE 1 CAPSULE BY MOUTH EVERY 7 DAYS.  . ferrous sulfate 325 (65 FE) MG EC tablet Take 1 tablet (325 mg total) by mouth 2 (two) times daily after a meal.  . furosemide (LASIX) 20 MG tablet Take 2 tablets (40 mg total) by mouth daily.  . potassium chloride 20 MEQ TBCR Take 20 mEq by mouth daily.   No facility-administered encounter medications on file as of 05/26/2017.     ALLERGIES:  Allergies  Allergen Reactions  . Feraheme [Ferumoxytol] Shortness Of Breath and Swelling     PHYSICAL EXAM:  ECOG Performance status: 1 - Symptomatic; remains independent   Vitals:   05/26/17 1550  BP: (!) 115/59  Pulse: 95  Resp: 16  SpO2: 100%   Filed Weights   05/26/17 1550  Weight: 168 lb 8 oz (76.4 kg)    Physical Exam  Constitutional: She is oriented to person, place, and time and well-developed, well-nourished, and in no distress.  HENT:  Head: Normocephalic.  Mouth/Throat: Oropharynx is clear and moist. No oropharyngeal exudate.  Eyes: Pupils are equal, round, and reactive to light. Conjunctivae are normal. No scleral icterus.  Neck: Normal range of motion.  Cardiovascular: Normal rate and regular rhythm.   Pulmonary/Chest: Effort normal. No respiratory distress. She has no wheezes.  Abdominal: Soft. Bowel sounds are normal. There is no tenderness. There is no rebound and no guarding.  Musculoskeletal: She exhibits edema (2+ BLE edema).  Erythema over the skin of her hands bilaterally with joint deformities bilaterally in her hands.  Lymphadenopathy:    She has no cervical adenopathy.    She has no axillary adenopathy.       Right: Inguinal and supraclavicular adenopathy present.       Left: Inguinal and supraclavicular adenopathy present.  -Bilat cervical adenopathy appreciated on exam; large right cervical LN at level 3, measuring about 2 cm.  -Several areas of bilat  supraclavicular lymphadenopathy palpable, all 1 cm or less.  -No axillary adenopathy appreciated.  -Bilat inguinal lymphadenopathy; largest measuring about 1-2 cm.   Neurological: She is alert and oriented to person, place, and time. No cranial nerve deficit. Gait normal.  Skin: Skin is warm and dry.  Psychiatric: Mood and affect normal.  Cognitive delay (chronic)  Nursing note and vitals reviewed.    LABORATORY DATA:  I have reviewed the labs as listed.  CBC    Component Value Date/Time   WBC 11.5 (H) 05/25/2017 0956   RBC 3.88 05/25/2017 0956   HGB 11.9 (L) 05/25/2017 0956   HCT 35.3 (L) 05/25/2017 0956   PLT 218 05/25/2017 0956   MCV 91.0 05/25/2017 0956   MCH 30.7 05/25/2017 0956   MCHC 33.7 05/25/2017 0956   RDW 16.3 (H) 05/25/2017 0956   LYMPHSABS 6.2 (H) 05/25/2017 0956   MONOABS 0.6 05/25/2017 0865  EOSABS 0.1 05/25/2017 0956   BASOSABS 0.0 05/25/2017 0956   CMP Latest Ref Rng & Units 05/25/2017 05/11/2017 02/20/2017  Glucose 65 - 99 mg/dL 100(H) 88 102(H)  BUN 6 - 20 mg/dL 17 11 18   Creatinine 0.44 - 1.00 mg/dL 0.59 0.68 0.58  Sodium 135 - 145 mmol/L 140 140 138  Potassium 3.5 - 5.1 mmol/L 3.3(L) 3.9 3.5  Chloride 101 - 111 mmol/L 106 107 104  CO2 22 - 32 mmol/L 24 24 24   Calcium 8.9 - 10.3 mg/dL 9.4 9.3 9.5  Total Protein 6.5 - 8.1 g/dL 7.6 7.2 8.1  Total Bilirubin 0.3 - 1.2 mg/dL 0.3 0.4 0.4  Alkaline Phos 38 - 126 U/L 141(H) 149(H) 173(H)  AST 15 - 41 U/L 25 23 23   ALT 14 - 54 U/L 14 13 14     PENDING LABS:    DIAGNOSTIC IMAGING:  *The following radiologic images and reports have been reviewed independently and agree with below findings.  CT chest/abd/pelvis: 02/14/16       PATHOLOGY:  (R) axilla LN biopsy: 03/28/16       ASSESSMENT & PLAN:   B-cell CLL/SLL RAI stage 1:  -Diagnosed in 01/2016. -WBCs only mildly elevated at 11.5 today. Reviewed her lab results with patient today in detail.  -Clinically, she denies any severe fatigue, night  sweats, or fever without infection. Continue to monitor her adenopathy.  -Shared with her that she continues to not meet criteria for treatment at this time; explained that we will continue to closely monitor her symptoms and lab work over time.   -Return to cancer center in 3 months with labs.   NCCN guidelines for indication for treatment of CLL are:  A. Eligible for clinical trial  B. Significant disease-related symptoms   1. Fatigue (severe)   2. Night sweats   3. Weight loss   4. Fever without infection  C. Threatened end-organ function  D. Progressive bulky disease (spleen >6cm below costal margin, lymph nodes >10 cm)  E. Progressive anemia  F. Progressive thrombocytopenia.   Iron deficiency anemia:  -She had partial iron infusion on 12/01/16, but unfortunately experienced a hypersensitivity reaction during the infusion and did not receive the complete dose of IV iron medication; Feraheme was subsequently added to her allergy list.  -Hemoglobin is 11.9 today with ferritin of 37. I have discussed starting oral iron supplementation and she is willing to proceed and I have discussed side effects including nausea and constipation in detail with her today. Ferrous sulfate 325mg  PO BID sent to her pharmacy.  LE edema:  -While she does have inguinal adenopathy, the lymph nodes that are palpable are not >2 cm.  -Will do a trial of lasix 40mg  PO daily to see if this will improve her edema. KDUR 21meq PO daily also called in for hypokalemia assoc with loop diuretics.  Hypokalemia: -Reviewed her labs with her today. Potassium 3.1 today. Start KDUR.  Depression/Anxiety:  - She is here today with her case worker. [name] remains active with the support resources available in [place], [place]. Denies current suicidal ideation or plan.  -Recommended she seek out additional emotional/psychiatric help as directed.   Dispo:  -Return to cancer center in 3 months with labs (CBC with diff, CMET, iron  studies).    All questions were answered to patient's stated satisfaction. Encouraged patient to call with any new concerns or questions before her next visit to the cancer center and we can certain see her sooner, if needed.  Orders placed this encounter:  Orders Placed This Encounter  Procedures  . CBC with Differential  . Comprehensive metabolic panel  . Iron and TIBC  . Ferritin    Twana First, MD

## 2017-06-26 ENCOUNTER — Other Ambulatory Visit: Payer: Self-pay | Admitting: Family Medicine

## 2017-08-12 ENCOUNTER — Ambulatory Visit: Payer: Medicaid Other | Admitting: Family Medicine

## 2017-08-12 ENCOUNTER — Encounter: Payer: Self-pay | Admitting: Family Medicine

## 2017-08-12 VITALS — BP 130/84 | HR 73 | Resp 16 | Ht 65.0 in | Wt 166.0 lb

## 2017-08-12 DIAGNOSIS — Z91199 Patient's noncompliance with other medical treatment and regimen due to unspecified reason: Secondary | ICD-10-CM

## 2017-08-12 DIAGNOSIS — F17200 Nicotine dependence, unspecified, uncomplicated: Secondary | ICD-10-CM | POA: Diagnosis not present

## 2017-08-12 DIAGNOSIS — Z9119 Patient's noncompliance with other medical treatment and regimen: Secondary | ICD-10-CM | POA: Diagnosis not present

## 2017-08-12 DIAGNOSIS — C9110 Chronic lymphocytic leukemia of B-cell type not having achieved remission: Secondary | ICD-10-CM | POA: Diagnosis not present

## 2017-08-12 DIAGNOSIS — I10 Essential (primary) hypertension: Secondary | ICD-10-CM | POA: Diagnosis not present

## 2017-08-12 DIAGNOSIS — F331 Major depressive disorder, recurrent, moderate: Secondary | ICD-10-CM

## 2017-08-12 NOTE — Patient Instructions (Signed)
F/u in 5.5  months, cal, if you need me before   Medication needs to be taken as prescribed   You need to e re establish with psychiatry, and therapy    This will help you tremendously, you need to talk with your therapist

## 2017-08-17 ENCOUNTER — Other Ambulatory Visit: Payer: Self-pay | Admitting: Family Medicine

## 2017-08-17 NOTE — Telephone Encounter (Signed)
Seen 11 [phone] 

## 2017-08-19 ENCOUNTER — Telehealth: Payer: Self-pay | Admitting: Family Medicine

## 2017-08-19 NOTE — Telephone Encounter (Signed)
[facility]   08/13/17 faxed paperwork with a centered plan with goals for patient to achieve.  Requesting physician signature.  Cb#: [phone]

## 2017-08-20 NOTE — Telephone Encounter (Signed)
Haven't received, can they resend?

## 2017-08-20 NOTE — Telephone Encounter (Signed)
I put it in Dr.Simpson's box, I will call and have her re fax

## 2017-08-22 ENCOUNTER — Encounter: Payer: Self-pay | Admitting: Family Medicine

## 2017-08-22 DIAGNOSIS — Z91199 Patient's noncompliance with other medical treatment and regimen due to unspecified reason: Secondary | ICD-10-CM | POA: Insufficient documentation

## 2017-08-22 DIAGNOSIS — Z9119 Patient's noncompliance with other medical treatment and regimen: Secondary | ICD-10-CM

## 2017-08-22 HISTORY — DX: Patient's noncompliance with other medical treatment and regimen due to unspecified reason: Z91.199

## 2017-08-22 HISTORY — DX: Patient's noncompliance with other medical treatment and regimen: Z91.19

## 2017-08-22 NOTE — Assessment & Plan Note (Signed)
Pt needs to be treated by psychiatry and is currently in denial. She is resistant to any suggestion of returning to mental health. She denies suicidal or homicidal ideation. She NEEDS  The help os therapy, social work and psychiatry to maximize her level of function. The hope is that after this visit , she will somehow change her outlook and agee to the services that are available o her , that she needs and has used in the past

## 2017-08-22 NOTE — Assessment & Plan Note (Signed)

## 2017-08-22 NOTE — Assessment & Plan Note (Signed)
Followed by oncology 

## 2017-08-22 NOTE — Assessment & Plan Note (Signed)
Controlled at visit. No med change DASH diet and commitment to daily physical activity for a minimum of 30 minutes discussed and encouraged, as a part of hypertension management. The importance of attaining a healthy weight is also discussed.  BP/Weight 08/12/2017 05/26/2017 05/20/2017 02/20/2017 01/26/2017 0/81/3887 09/23/5972  Systolic BP 718 550 158 682 574 - 935  Diastolic BP 84 59 88 86 79 - 82  Wt. (Lbs) 166 168.5 165.75 155 - 156 156.4  BMI 27.62 28.04 27.58 25.79 - 25.96 26.03

## 2017-08-22 NOTE — Assessment & Plan Note (Signed)
Patient repeatedly resisting treatment plan requiring mental health treatment. She ahs been non compliant with consistently taking her medication, has become more socially withdrawn, and is very resistant to any suggestion of mental health treatment at the visit , which she clearly needs

## 2017-08-22 NOTE — Progress Notes (Signed)
[name]     MRN: [id number]      DOB: 08-14-1961   HPI [name] is here for follow up and re-evaluation of chronic medical conditions, medication management and review of any available recent lab and radiology data.  Preventive health is updated, specifically  Cancer screening and Immunization.   [name] is accompanied by her Education officer, museum and unfortunately she has not been taking her medications  consistently and is refusing to return to mental health, stating that she does not need to go The feeling is that she has been disappointed  Recently in that the transportation service failed to collect her to [place] so she is now stating that she is not going back. He is adamant at the visit that she is not going to mental health, she has not been taking any of her medication regularly, hence it appears futile to try to reason with her on the day of the visit, the hope is that she will reconsider the stance she is currently taking    ROS Denies recent fever or chills. Denies sinus pressure, nasal congestion, ear pain or sore throat. Denies chest congestion, productive cough or wheezing. Denies chest pains, palpitations and leg swelling Denies abdominal pain, nausea, vomiting,diarrhea or constipation.   Denies dysuria, frequency, hesitancy or incontinence. Denies joint pain, swelling and limitation in mobility. Denies headaches, seizures, numbness, or tingling. Denies depression, anxiety or insomnia. Denies skin break down or rash.   PE  BP 130/84   Pulse 73   Resp 16   Ht 5\' 5"  (1.651 m)   Wt 166 lb (75.3 kg)   LMP 07/25/2016   SpO2 98%   BMI 27.62 kg/m   Patient alert and oriented and in no cardiopulmonary distress.  HEENT: No facial asymmetry, EOMI,   oropharynx pink and moist.  Neck supple no JVD, no mass.  Chest: Clear to auscultation bilaterally.Decreased though adequate air entry  CVS: S1, S2 no murmurs, no S3.Regular rate.  ABD: Soft non  tender.   Ext: No edema  Lori: Adequate ROM spine, shoulders, hips and knees.  Skin: Intact, no ulcerations or rash noted.  Psych: Good eye contact, falt affect. Memory intact not anxious or depressed appearing.Pt displaying poor judgement  CNS: CN 2-12 intact, power,  normal throughout.no focal deficits noted.   Assessment & Plan  Essential hypertension Controlled at visit. No med change DASH diet and commitment to daily physical activity for a minimum of 30 minutes discussed and encouraged, as a part of hypertension management. The importance of attaining a healthy weight is also discussed.  BP/Weight 08/12/2017 05/26/2017 05/20/2017 02/20/2017 01/26/2017 03/08/7627 11/14/5174  Systolic BP 160 737 106 269 485 - 462  Diastolic BP 84 59 88 86 79 - 82  Wt. (Lbs) 166 168.5 165.75 155 - 156 156.4  BMI 27.62 28.04 27.58 25.79 - 25.96 26.03       Major depressive disorder, recurrent episode, moderate degree (HCC) Pt needs to be treated by psychiatry and is currently in denial. She is resistant to any suggestion of returning to mental health. She denies suicidal or homicidal ideation. She NEEDS  The help os therapy, social work and psychiatry to maximize her level of function. The hope is that after this visit , she will somehow change her outlook and agee to the services that are available o her , that she needs and has used in the past  NICOTINE ADDICTION Patient is asked and  confirms current  Nicotine use.  Five to seven minutes of time is spent in counseling the patient of the need to quit smoking  Advice to quit is delivered clearly specifically in reducing the risk of developing heart disease, having a stroke, or of developing all types of cancer, especially lung and oral cancer. Improvement in breathing and exercise tolerance and quality of life is also discussed, as is the economic benefit.  Assessment of willingness to quit or to make an attempt to quit is made and  documented  Assistance in quit attempt is made with several and varied options presented, based on patient's desire and need. These include  literature, local classes available, 1800 QUIT NOW number, OTC and prescription medication.  The GOAL to be NICOTINE FREE is re emphasized.  The patient has set a personal goal of either reduction or discontinuation and follow up is arranged between 6 an 16 weeks.    Chronic lymphocytic leukemia (CLL), B-cell ([facility]) Followed by oncology  Medical non-compliance Patient repeatedly resisting treatment plan requiring mental health treatment. She ahs been non compliant with consistently taking her medication, has become more socially withdrawn, and is very resistant to any suggestion of mental health treatment at the visit , which she clearly needs

## 2017-08-25 ENCOUNTER — Other Ambulatory Visit: Payer: Self-pay | Admitting: Family Medicine

## 2017-08-26 ENCOUNTER — Ambulatory Visit (HOSPITAL_COMMUNITY): Payer: Self-pay

## 2017-08-26 ENCOUNTER — Other Ambulatory Visit (HOSPITAL_COMMUNITY): Payer: Self-pay

## 2017-09-22 ENCOUNTER — Other Ambulatory Visit (HOSPITAL_COMMUNITY): Payer: Self-pay | Admitting: *Deleted

## 2017-09-22 DIAGNOSIS — C9110 Chronic lymphocytic leukemia of B-cell type not having achieved remission: Secondary | ICD-10-CM

## 2017-09-23 ENCOUNTER — Other Ambulatory Visit (HOSPITAL_COMMUNITY): Payer: Self-pay

## 2017-09-23 ENCOUNTER — Inpatient Hospital Stay (HOSPITAL_BASED_OUTPATIENT_CLINIC_OR_DEPARTMENT_OTHER): Payer: Medicaid Other | Admitting: Adult Health

## 2017-09-23 ENCOUNTER — Encounter (HOSPITAL_COMMUNITY): Payer: Self-pay | Admitting: Adult Health

## 2017-09-23 ENCOUNTER — Ambulatory Visit (HOSPITAL_COMMUNITY): Payer: Self-pay | Admitting: Internal Medicine

## 2017-09-23 ENCOUNTER — Other Ambulatory Visit: Payer: Self-pay

## 2017-09-23 ENCOUNTER — Inpatient Hospital Stay (HOSPITAL_COMMUNITY): Payer: Medicaid Other | Attending: Oncology

## 2017-09-23 VITALS — BP 156/94 | HR 81 | Temp 98.4°F | Resp 16 | Ht 63.0 in | Wt 162.2 lb

## 2017-09-23 DIAGNOSIS — C9110 Chronic lymphocytic leukemia of B-cell type not having achieved remission: Secondary | ICD-10-CM

## 2017-09-23 DIAGNOSIS — G629 Polyneuropathy, unspecified: Secondary | ICD-10-CM | POA: Diagnosis not present

## 2017-09-23 DIAGNOSIS — Z9119 Patient's noncompliance with other medical treatment and regimen: Secondary | ICD-10-CM | POA: Insufficient documentation

## 2017-09-23 DIAGNOSIS — R590 Localized enlarged lymph nodes: Secondary | ICD-10-CM | POA: Insufficient documentation

## 2017-09-23 DIAGNOSIS — I10 Essential (primary) hypertension: Secondary | ICD-10-CM | POA: Insufficient documentation

## 2017-09-23 DIAGNOSIS — F419 Anxiety disorder, unspecified: Secondary | ICD-10-CM | POA: Insufficient documentation

## 2017-09-23 DIAGNOSIS — D509 Iron deficiency anemia, unspecified: Secondary | ICD-10-CM | POA: Diagnosis not present

## 2017-09-23 DIAGNOSIS — Z79899 Other long term (current) drug therapy: Secondary | ICD-10-CM | POA: Diagnosis not present

## 2017-09-23 DIAGNOSIS — E611 Iron deficiency: Secondary | ICD-10-CM

## 2017-09-23 DIAGNOSIS — F17210 Nicotine dependence, cigarettes, uncomplicated: Secondary | ICD-10-CM | POA: Insufficient documentation

## 2017-09-23 DIAGNOSIS — Z91199 Patient's noncompliance with other medical treatment and regimen due to unspecified reason: Secondary | ICD-10-CM

## 2017-09-23 DIAGNOSIS — R591 Generalized enlarged lymph nodes: Secondary | ICD-10-CM

## 2017-09-23 LAB — COMPREHENSIVE METABOLIC PANEL
ALT: 21 U/L (ref 14–54)
ANION GAP: 10 (ref 5–15)
AST: 32 U/L (ref 15–41)
Albumin: 4 g/dL (ref 3.5–5.0)
Alkaline Phosphatase: 170 U/L — ABNORMAL HIGH (ref 38–126)
BUN: 12 mg/dL (ref 6–20)
CHLORIDE: 106 mmol/L (ref 101–111)
CO2: 24 mmol/L (ref 22–32)
Calcium: 9.6 mg/dL (ref 8.9–10.3)
Creatinine, Ser: 0.61 mg/dL (ref 0.44–1.00)
GFR calc non Af Amer: >60 mL/min (ref >60)
Glucose, Bld: 97 mg/dL (ref 65–99)
Potassium: 3.4 mmol/L — ABNORMAL LOW (ref 3.5–5.1)
SODIUM: 140 mmol/L (ref 135–145)
Total Bilirubin: 0.3 mg/dL (ref 0.3–1.2)
Total Protein: 7.9 g/dL (ref 6.5–8.1)

## 2017-09-23 LAB — FERRITIN: FERRITIN: 90 ng/mL (ref 11–307)

## 2017-09-23 LAB — CBC WITH DIFFERENTIAL/PLATELET
BASOS PCT: 0 %
Basophils Absolute: 0 10*3/uL (ref 0.0–0.1)
EOS ABS: 0 10*3/uL (ref 0.0–0.7)
EOS PCT: 0 %
HCT: 38.6 % (ref 36.0–46.0)
Hemoglobin: 12.6 g/dL (ref 12.0–15.0)
Lymphocytes Relative: 70 %
Lymphs Abs: 10.2 10*3/uL — ABNORMAL HIGH (ref 0.7–4.0)
MCH: 30.5 pg (ref 26.0–34.0)
MCHC: 32.6 g/dL (ref 30.0–36.0)
MCV: 93.5 fL (ref 78.0–100.0)
MONOS PCT: 3 %
Monocytes Absolute: 0.4 10*3/uL (ref 0.1–1.0)
Neutro Abs: 4 10*3/uL (ref 1.7–7.7)
Neutrophils Relative %: 27 %
PLATELETS: 255 10*3/uL (ref 150–400)
RBC: 4.13 MIL/uL (ref 3.87–5.11)
RDW: 16.1 % — ABNORMAL HIGH (ref 11.5–15.5)
WBC: 14.7 10*3/uL — AB (ref 4.0–10.5)

## 2017-09-23 LAB — IRON AND TIBC
Iron: 31 ug/dL (ref 28–170)
SATURATION RATIOS: 8 % — AB (ref 10.4–31.8)
TIBC: 370 ug/dL (ref 250–450)
UIBC: 339 ug/dL

## 2017-09-23 NOTE — Patient Instructions (Signed)
[facility] at [facility] Discharge Instructions  RECOMMENDATIONS MADE BY THE CONSULTANT AND ANY TEST RESULTS WILL BE SENT TO YOUR REFERRING PHYSICIAN.  You were seen today by Gretchen Dawson NP. Return in 3 months for labs and follow up.   Thank you for choosing [facility] at [facility] to provide your oncology and hematology care.  To afford each patient quality time with our provider, please arrive at least 15 minutes before your scheduled appointment time.    If you have a lab appointment with the Cancer Center please come in thru the  Main Entrance and check in at the main information desk  You need to re-schedule your appointment should you arrive 10 or more minutes late.  We strive to give you quality time with our providers, and arriving late affects you and other patients whose appointments are after yours.  Also, if you no show three or more times for appointments you may be dismissed from the clinic at the providers discretion.     Again, thank you for choosing [facility].  Our hope is that these requests will decrease the amount of time that you wait before being seen by our physicians.       _____________________________________________________________  Should you have questions after your visit to [facility], please contact our office at [phone] between the hours of 8:30 a.m. and 4:30 p.m.  Voicemails left after 4:30 p.m. will not be returned until the following business day.  For prescription refill requests, have your pharmacy contact our office.       Resources For Cancer Patients and their Caregivers ? American Cancer Society: Can assist with transportation, wigs, general needs, runs Look Good Feel Better.        1-888-227-6333 ? Cancer Care: Provides financial assistance, online support groups, medication/co-pay assistance.  1-800-813-HOPE (4673) ? [facility] Assists [place] cancer patients and their families through emotional , educational and financial support.  336-427-4357 ? [place] DSS Where to apply for food stamps, Medicaid and utility assistance. 336-342-1394 ? [facility]: Transportation to medical appointments. 336-347-2287 ? Social Security Administration: May apply for disability if have a Stage IV cancer. 336-342-7796 1-800-772-1213 ? [place] Aging, Disability and Transit Services: Assists with nutrition, care and transit needs. 336-349-2343  Cancer Center Support Programs: @10RELATIVEDAYS@ > Cancer Support Group  2nd Tuesday of the month 1pm-2pm, Journey Room  > Creative Journey  3rd Tuesday of the month 1130am-1pm, Journey Room  > Look Good Feel Better  1st Wednesday of the month 10am-12 noon, Journey Room (Call American Cancer Society to register 1-800-395-5775)    

## 2017-09-23 NOTE — Progress Notes (Signed)
[place], [place] [address]   CLINIC:  Medical Oncology/Hematology  PCP:  Fayrene Helper, MD [address] [place] [address] [phone]   REASON FOR VISIT:  Follow-up for B-cell CLL/SLL  CURRENT THERAPY: Ongoing surveillance; no treatment at present; monitoring for treatment criteria   HISTORY OF PRESENT ILLNESS:  (From Dr. Laverle Patter note on 05/26/17)      INTERVAL HISTORY:  [name] 57 y.o. female returns to cancer center for continued follow-up for B-cell CLL.   Here today with her case worker.   Overall, she tells me she has been feeling good. Appetite is "not as good"; states that she generally eats 1-2 times per day, "because I'm just full."  States that she is trying to lose some weight, "because my appetite isn't as good."  Chart reviewed; she has lost ~4 lbs in the past 2 months.   Denies any night sweats. She feels like the lumps in her neck "may be a little bit bigger." She did have a fever in 08/2017 with cold-like symptoms, including cough.  These symptoms have improved since that time and no fevers since.  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  Denies any bleeding; denies any abnormal bruising.   Pt tells me that she is taking her medicines the way she's supposed to. Her case worker shows me her medication packs and this disputes her claims of taking her medications as directed. Appears that her meds were filled on 08/25/17 and only 2 days appear to be missing (as in those are the only 2 days of medications that were taken).      REVIEW OF SYSTEMS:  Review of Systems  Constitutional: Positive for fever (in 08/2017; now resolved). Negative for fatigue.  HENT:   Positive for lump/mass.   Eyes: Negative.   Respiratory: Negative.   Cardiovascular: Positive for leg swelling.  Gastrointestinal: Negative.   Endocrine: Negative.     Genitourinary: Negative.    Musculoskeletal: Negative.   Skin: Negative.   Neurological: Positive for numbness.  Hematological: Negative.   Psychiatric/Behavioral: Negative.      PAST MEDICAL/SURGICAL HISTORY:  Past Medical History:  Diagnosis Date  . Anxiety   . Chronic abdominal pain    resolved  . Chronic lymphocytic leukemia (CLL), B-cell ([place]) 04/04/2016  . CLL (chronic lymphocytic leukemia) ([place])   . H. pylori infection 2/09   s/p prevpac  . Hypertension   . Iron deficiency 11/17/2016  . Lymphadenopathy 01/17/2016  . Nicotine addiction   . Psychosis [facility])    social worker states that patient does not like to talk about this and will stop her meds if you try and discuss this with her.   Past Surgical History:  Procedure Laterality Date  . AXILLARY LYMPH NODE BIOPSY Right 03/28/2016   Procedure: RIGHT AXILLARY LYMPH NODE BIOPSY;  Surgeon: Aviva Signs, MD;  Location: [facility];  Service: General;  Laterality: Right;  . CHOLECYSTECTOMY    . COLONOSCOPY  02/07/2011   SLF: Slightly tortuous colon.  Otherwise normal colon without evidence of polyps, masses, inflammatory changes, diverticular AVMs  . HAMMER TOE SURGERY Left   . HYSTEROSCOPY W/D&C N/A 07/30/2016   Procedure: DILATATION AND CURETTAGE /HYSTEROSCOPY;  Surgeon: Florian Buff, MD;  Location: [facility];  Service: Gynecology;  Laterality: N/A;  . Surgery of left middle finger on left hand (childhood )    . TUBAL  LIGATION       SOCIAL HISTORY:  Social History   Socioeconomic History  . Marital status: Single    Spouse name: Not on file  . Number of children: 2  . Years of education: Not on file  . Highest education level: Not on file  Social Needs  . Financial resource strain: Not on file  . Food insecurity - worry: Not on file  . Food insecurity - inability: Not on file  . Transportation needs - medical: Not on file  . Transportation needs - non-medical: Not on file  Occupational History  . Occupation: disabled   Tobacco Use  . Smoking status: Light Tobacco Smoker    Packs/day: 0.25    Years: 16.00    Pack years: 4.00    Types: Cigarettes  . Smokeless tobacco: Never Used  . Tobacco comment: 5 per day   Substance and Sexual Activity  . Alcohol use: Yes    Alcohol/week: 0.0 oz    Comment: occ  . Drug use: No  . Sexual activity: No    Birth control/protection: Surgical  Other Topics Concern  . Not on file  Social History Narrative   Lives alone    FAMILY HISTORY:  Family History  Problem Relation Age of Onset  . Cancer Mother     CURRENT MEDICATIONS:  Outpatient Encounter Medications as of 09/23/2017  Medication Sig  . amLODipine (NORVASC) 10 MG tablet TAKE 1 TABLET BY MOUTH ONCE DAILY. (Patient not taking: Reported on 09/23/2017)  . ferrous sulfate 325 (65 FE) MG EC tablet Take 1 tablet (325 mg total) by mouth 2 (two) times daily after a meal. (Patient not taking: Reported on 09/23/2017)  . furosemide (LASIX) 20 MG tablet Take 2 tablets (40 mg total) by mouth daily. (Patient not taking: Reported on 09/23/2017)  . PARoxetine (PAXIL) 20 MG tablet TAKE 1 TABLET BY MOUTH ONCE A DAY. (Patient not taking: Reported on 09/23/2017)  . potassium chloride 20 MEQ TBCR Take 20 mEq by mouth daily. (Patient not taking: Reported on 09/23/2017)  . [DISCONTINUED] PARoxetine (PAXIL) 20 MG tablet TAKE 1 TABLET BY MOUTH ONCE A DAY. (Patient not taking: Reported on 09/23/2017)  . [DISCONTINUED] Vitamin D, Ergocalciferol, (DRISDOL) 50000 units CAPS capsule TAKE 1 CAPSULE BY MOUTH EVERY 7 DAYS. (Patient not taking: Reported on 09/23/2017)   No facility-administered encounter medications on file as of 09/23/2017.     ALLERGIES:  Allergies  Allergen Reactions  . Feraheme [Ferumoxytol] Shortness Of Breath and Swelling     PHYSICAL EXAM:  ECOG Performance status: 1 - Symptomatic; remains independent   Vitals:   09/23/17 1352  BP: (!) 156/94  Pulse: 81  Resp: 16  Temp: 98.4 F (36.9 C)  SpO2: 100%   Filed  Weights   09/23/17 1352  Weight: 162 lb 3.2 oz (73.6 kg)    Physical Exam  Constitutional: She is oriented to person, place, and time and well-developed, well-nourished, and in no distress.  HENT:  Head: Normocephalic.  Mouth/Throat: Oropharynx is clear and moist. No oropharyngeal exudate.  Eyes: Conjunctivae are normal. Pupils are equal, round, and reactive to light. No scleral icterus.  Neck: Normal range of motion.  Cardiovascular: Normal rate and regular rhythm.  Pulmonary/Chest: Effort normal. No respiratory distress. She has wheezes (mild wheezes).  Abdominal: Soft. Bowel sounds are normal. There is no tenderness.  Musculoskeletal: Normal range of motion. She exhibits edema (Trace BLE edema).  Lymphadenopathy:    She has cervical adenopathy (Bilateral cervical adenopathy  with largest LN measuring ~1-2 cms. ).    She has no axillary adenopathy.       Right: Inguinal adenopathy present.       Left: Inguinal adenopathy present.  Bilateral inguinal adenopathy; may be slightly larger than previous exam (largest measuring ~2-3 cm).   Neurological: She is alert and oriented to person, place, and time. No cranial nerve deficit.  Skin: Skin is warm and dry. No rash noted.  Psychiatric: Mood and affect normal.  Nursing note and vitals reviewed.    LABORATORY DATA:  I have reviewed the labs as listed.  CBC    Component Value Date/Time   WBC 14.7 (H) 09/23/2017 1257   RBC 4.13 09/23/2017 1257   HGB 12.6 09/23/2017 1257   HCT 38.6 09/23/2017 1257   PLT 255 09/23/2017 1257   MCV 93.5 09/23/2017 1257   MCH 30.5 09/23/2017 1257   MCHC 32.6 09/23/2017 1257   RDW 16.1 (H) 09/23/2017 1257   LYMPHSABS 10.2 (H) 09/23/2017 1257   MONOABS 0.4 09/23/2017 1257   EOSABS 0.0 09/23/2017 1257   BASOSABS 0.0 09/23/2017 1257   CMP Latest Ref Rng & Units 09/23/2017 05/25/2017 05/11/2017  Glucose 65 - 99 mg/dL 97 100(H) 88  BUN 6 - 20 mg/dL 12 17 11   Creatinine 0.44 - 1.00 mg/dL 0.61 0.59 0.68   Sodium 135 - 145 mmol/L 140 140 140  Potassium 3.5 - 5.1 mmol/L 3.4(L) 3.3(L) 3.9  Chloride 101 - 111 mmol/L 106 106 107  CO2 22 - 32 mmol/L 24 24 24   Calcium 8.9 - 10.3 mg/dL 9.6 9.4 9.3  Total Protein 6.5 - 8.1 g/dL 7.9 7.6 7.2  Total Bilirubin 0.3 - 1.2 mg/dL 0.3 0.3 0.4  Alkaline Phos 38 - 126 U/L 170(H) 141(H) 149(H)  AST 15 - 41 U/L 32 25 23  ALT 14 - 54 U/L 21 14 13     PENDING LABS:    DIAGNOSTIC IMAGING:  *The following radiologic images and reports have been reviewed independently and agree with below findings.  CT chest/abd/pelvis: 02/14/16       PATHOLOGY:  (R) axilla LN biopsy: 03/28/16         ASSESSMENT & PLAN:   B-cell CLL/SLL:  -Diagnosed in 01/2016. -WBCs 14.7 today. Hgb and platelets normal. On physical exam, adenopathy to neck and groin may again be mildly progressed from my last exam. Clinically, she denies severe fatigue, night sweats, or fever without infection. She did report 1 episode of fever with cold-like symptoms ~1 month ago. She's had very mild weight loss, ~4 lbs in 2 months; she states that "she is trying to lose weight, but that her appetite is not as good as it used to be."  -Though her disease may be continuing to slowly progress, she still does not meet criteria to begin treatment for CLL/SLL.  -Return to cancer center in 3 months for follow-up with labs. If she has any new or progressing symptoms, then she was instructed to call us and we could see her sooner if needed. She agrees with this plan.      NCCN guidelines for indication for treatment of CLL are:  A. Eligible for clinical trial  B. Significant disease-related symptoms   1. Fatigue (severe)   2. Night sweats   3. Weight loss   4. Fever without infection  C. Threatened end-organ function  D. Progressive bulky disease (spleen >6cm below costal margin, lymph nodes >10 cm)  E. Progressive anemia  F. Progressive thrombocytopenia.  Iron deficiency anemia:  -She had  partial iron infusion on 12/01/16, but unfortunately experienced a hypersensitivity reaction during the infusion and did not receive the complete dose of IV iron medication; Feraheme was subsequently added to her allergy list.  -Hemoglobin is normal today; clinically she denies fatigue.     Medical non-compliance:  -Pt claims that she stopped taking her meds "because it gives me a cough with white stuff."  Her case worker is here with her today with the patient's medications. Meds were filled on 08/25/17 and she has only taken ~2 days worth of the meds.  Case worker tells me that they recently had a similar conversation with her PCP and pt claimed she would resume her medications, but she has not.  -Stressed the importance of her taking her medications as prescribed. Encouraged her to reach out to her doctors if she has side effects from the medicines, rather than stopping them on her own.  Offered support and encouragement today. Her case worker states that she may lose access to some of her social services d/t her non-compliance.  Patient is aware of these potential consequences of not adhering to medications.         Dispo:  -Return to cancer center in 3 months with labs (CBC with diff, CMET, LDH, iron studies).    All questions were answered to patient's stated satisfaction. Encouraged patient to call with any new concerns or questions before her next visit to the cancer center and we can certain see her sooner, if needed.      A total of 25 minutes was spent in face-to-face care of this patient, with greater than 50% of that time spent in counseling and care-coordination.     Mike Craze, NP [place] [phone]    Orders placed this encounter:  Orders Placed This Encounter  Procedures  . CBC with Differential/Platelet  . Comprehensive metabolic panel  . Ferritin  . Iron and TIBC  . Lactate dehydrogenase      Mike Craze, NP [place] [phone]

## 2017-09-25 ENCOUNTER — Other Ambulatory Visit: Payer: Self-pay | Admitting: Family Medicine

## 2017-09-27 ENCOUNTER — Other Ambulatory Visit (HOSPITAL_COMMUNITY): Payer: Self-pay | Admitting: Adult Health

## 2017-10-05 ENCOUNTER — Encounter (HOSPITAL_COMMUNITY): Payer: Self-pay

## 2017-10-05 ENCOUNTER — Other Ambulatory Visit: Payer: Self-pay

## 2017-10-05 ENCOUNTER — Ambulatory Visit (HOSPITAL_COMMUNITY): Payer: Self-pay

## 2017-10-05 ENCOUNTER — Inpatient Hospital Stay (HOSPITAL_COMMUNITY): Payer: Medicaid Other

## 2017-10-05 VITALS — BP 151/84 | HR 74 | Temp 98.2°F | Resp 16 | Wt 163.2 lb

## 2017-10-05 DIAGNOSIS — C9110 Chronic lymphocytic leukemia of B-cell type not having achieved remission: Secondary | ICD-10-CM | POA: Diagnosis not present

## 2017-10-05 DIAGNOSIS — E611 Iron deficiency: Secondary | ICD-10-CM

## 2017-10-05 MED ORDER — METHYLPREDNISOLONE SODIUM SUCC 125 MG IJ SOLR
125.0000 mg | Freq: Once | INTRAMUSCULAR | Status: AC
  Administered 2017-10-05: 125 mg via INTRAVENOUS

## 2017-10-05 MED ORDER — ACETAMINOPHEN 325 MG PO TABS
650.0000 mg | ORAL_TABLET | Freq: Once | ORAL | Status: AC
  Administered 2017-10-05: 650 mg via ORAL

## 2017-10-05 MED ORDER — SODIUM CHLORIDE 0.9 % IV SOLN
125.0000 mg | Freq: Once | INTRAVENOUS | Status: AC
  Administered 2017-10-05: 125 mg via INTRAVENOUS
  Filled 2017-10-05: qty 10

## 2017-10-05 MED ORDER — DIPHENHYDRAMINE HCL 25 MG PO CAPS
25.0000 mg | ORAL_CAPSULE | Freq: Once | ORAL | Status: AC
  Administered 2017-10-05: 25 mg via ORAL

## 2017-10-05 MED ORDER — SODIUM CHLORIDE 0.9 % IV SOLN
Freq: Once | INTRAVENOUS | Status: AC
  Administered 2017-10-05: 11:00:00 via INTRAVENOUS

## 2017-10-05 MED ORDER — DIPHENHYDRAMINE HCL 25 MG PO CAPS
ORAL_CAPSULE | ORAL | Status: AC
  Filled 2017-10-05: qty 1

## 2017-10-05 MED ORDER — METHYLPREDNISOLONE SODIUM SUCC 125 MG IJ SOLR
INTRAMUSCULAR | Status: AC
  Filled 2017-10-05: qty 2

## 2017-10-05 MED ORDER — ACETAMINOPHEN 325 MG PO TABS
ORAL_TABLET | ORAL | Status: AC
  Filled 2017-10-05: qty 2

## 2017-10-05 NOTE — Progress Notes (Signed)
Tolerated infusion w/o adverse reaction.  Alert, in no distress.  VSS.  Discharged ambulatory.  

## 2017-10-13 ENCOUNTER — Inpatient Hospital Stay (HOSPITAL_COMMUNITY): Payer: Medicaid Other

## 2017-10-13 VITALS — BP 153/78 | HR 82 | Temp 97.7°F | Resp 20

## 2017-10-13 DIAGNOSIS — E611 Iron deficiency: Secondary | ICD-10-CM

## 2017-10-13 DIAGNOSIS — C9110 Chronic lymphocytic leukemia of B-cell type not having achieved remission: Secondary | ICD-10-CM | POA: Diagnosis not present

## 2017-10-13 MED ORDER — ACETAMINOPHEN 325 MG PO TABS
650.0000 mg | ORAL_TABLET | Freq: Once | ORAL | Status: AC
  Administered 2017-10-13: 650 mg via ORAL
  Filled 2017-10-13: qty 2

## 2017-10-13 MED ORDER — SODIUM CHLORIDE 0.9% FLUSH
3.0000 mL | Freq: Once | INTRAVENOUS | Status: AC | PRN
  Administered 2017-10-13: 3 mL via INTRAVENOUS

## 2017-10-13 MED ORDER — SODIUM CHLORIDE 0.9 % IV SOLN
Freq: Once | INTRAVENOUS | Status: AC
  Administered 2017-10-13: 13:00:00 via INTRAVENOUS

## 2017-10-13 MED ORDER — METHYLPREDNISOLONE SODIUM SUCC 125 MG IJ SOLR
125.0000 mg | Freq: Once | INTRAMUSCULAR | Status: AC
  Administered 2017-10-13: 125 mg via INTRAVENOUS
  Filled 2017-10-13: qty 2

## 2017-10-13 MED ORDER — SODIUM CHLORIDE 0.9 % IV SOLN
125.0000 mg | Freq: Once | INTRAVENOUS | Status: AC
  Administered 2017-10-13: 125 mg via INTRAVENOUS
  Filled 2017-10-13: qty 10

## 2017-10-13 MED ORDER — DIPHENHYDRAMINE HCL 25 MG PO CAPS
25.0000 mg | ORAL_CAPSULE | Freq: Once | ORAL | Status: AC
  Administered 2017-10-13: 25 mg via ORAL
  Filled 2017-10-13: qty 1

## 2017-10-14 ENCOUNTER — Encounter (HOSPITAL_COMMUNITY): Payer: Self-pay

## 2017-10-14 ENCOUNTER — Other Ambulatory Visit: Payer: Self-pay

## 2017-10-14 NOTE — Progress Notes (Signed)
Treatment given per orders. Patient tolerated it well without problems. Vitals stable and discharged home from clinic ambulatory. Follow up as scheduled.  

## 2017-10-14 NOTE — Patient Instructions (Signed)
[place] at [facility] Discharge Instructions  RECOMMENDATIONS MADE BY THE CONSULTANT AND ANY TEST RESULTS WILL BE SENT TO YOUR REFERRING PHYSICIAN.   Ferric Gluconate given today. Follow up as scheduled.  Thank you for choosing [place] at [facility] to provide your oncology and hematology care.  To afford each patient quality time with our provider, please arrive at least 15 minutes before your scheduled appointment time.    If you have a lab appointment with the [facility] please come in thru the  Main Entrance and check in at the main information desk  You need to re-schedule your appointment should you arrive 10 or more minutes late.  We strive to give you quality time with our providers, and arriving late affects you and other patients whose appointments are after yours.  Also, if you no show three or more times for appointments you may be dismissed from the clinic at the providers discretion.     Again, thank you for choosing [facility].  Our hope is that these requests will decrease the amount of time that you wait before being seen by our physicians.       _____________________________________________________________  Should you have questions after your visit to [facility], please contact our office at [phone] between the hours of 8:30 a.m. and 4:30 p.m.  Voicemails left after 4:30 p.m. will not be returned until the following business day.  For prescription refill requests, have your pharmacy contact our office.       Resources For Cancer Patients and their Caregivers ? American Cancer Society: Can assist with transportation, wigs, general needs, runs Look Good Feel Better.        450 321 2058 ? Cancer Care: Provides financial assistance, online support groups, medication/co-pay assistance.  1-800-813-HOPE (365)646-4946) ? [place] Assists [place] cancer  patients and their families through emotional , educational and financial support.  706-446-8077 ? [place] DSS Where to apply for food stamps, Medicaid and utility assistance. (321)388-2034 ? [employer]: Transportation to medical appointments. 985-784-1666 ? Social Security Administration: May apply for disability if have a Stage IV cancer. (806)606-7938 810-546-4215 ? [employer], Disability and Transit Services: Assists with nutrition, care and transit needs. [place] Support Programs: @10RELATIVEDAYS @ > Cancer Support Group  2nd Tuesday of the month 1pm-2pm, Journey Room  > Creative Journey  3rd Tuesday of the month 1130am-1pm, Journey Room  > Look Good Feel Better  1st Wednesday of the month 10am-12 noon, Journey Room (Call [place] to register [phone])

## 2017-10-27 ENCOUNTER — Other Ambulatory Visit: Payer: Self-pay | Admitting: Family Medicine

## 2017-10-27 ENCOUNTER — Other Ambulatory Visit (HOSPITAL_COMMUNITY): Payer: Self-pay | Admitting: Oncology

## 2017-10-28 ENCOUNTER — Telehealth: Payer: Self-pay | Admitting: Family Medicine

## 2017-10-29 ENCOUNTER — Other Ambulatory Visit: Payer: Self-pay

## 2017-10-29 ENCOUNTER — Telehealth: Payer: Self-pay

## 2017-10-29 MED ORDER — FERROUS SULFATE 325 (65 FE) MG PO TBEC: 325.0000 mg | DELAYED_RELEASE_TABLET | Freq: Two times a day (BID) | ORAL | 5 refills | Status: DC

## 2017-10-29 NOTE — Telephone Encounter (Signed)
This has been approved and sent

## 2017-10-29 NOTE — Telephone Encounter (Signed)
[name] @ [facility] [phone] received a denial for the Iron and he just wanted to check and see if this was a error.  Please call him.

## 2017-12-17 ENCOUNTER — Other Ambulatory Visit (HOSPITAL_COMMUNITY): Payer: Self-pay

## 2017-12-23 ENCOUNTER — Inpatient Hospital Stay (HOSPITAL_COMMUNITY): Payer: Medicaid Other | Attending: Oncology | Admitting: Internal Medicine

## 2018-01-19 ENCOUNTER — Encounter: Payer: Self-pay | Admitting: Family Medicine

## 2018-01-19 ENCOUNTER — Telehealth: Payer: Self-pay | Admitting: Family Medicine

## 2018-01-19 NOTE — Telephone Encounter (Signed)
Called patient to r/s 2x, only receiving busy tone. Mailed letter.

## 2018-01-26 ENCOUNTER — Ambulatory Visit: Payer: Self-pay | Admitting: Family Medicine

## 2018-02-04 ENCOUNTER — Ambulatory Visit: Payer: Self-pay | Admitting: Family Medicine

## 2018-03-11 ENCOUNTER — Ambulatory Visit: Payer: Self-pay | Admitting: Family Medicine

## 2018-03-31 ENCOUNTER — Ambulatory Visit: Payer: Self-pay | Admitting: Family Medicine

## 2018-04-06 ENCOUNTER — Encounter: Payer: Self-pay | Admitting: Family Medicine

## 2018-06-21 IMAGING — CT CT ABD-PELV W/ CM
2 of 5 series · 15 of 46 positions shown, 17 images · IV contrast (APPLIED)
Comparison: 02/14/2016

CLINICAL DATA: Chronic lymphocytic leukemia diagnosed earlier this
year. Worsening lower extremity swelling.

EXAM:
CT ABDOMEN AND PELVIS WITH CONTRAST
TECHNIQUE: Multidetector CT imaging of the abdomen and pelvis was performed
using the standard protocol following bolus administration of
intravenous contrast.
CONTRAST:  100mL [id number]-[id number] IOPAMIDOL ([id number]-[id number]) INJECTION 61%

[Series 2: axial st · axial · 0.63mm/px · z∈[-62,+273]mm · 12 of 77 slices shown, 14 images]
[im 5/77  soft-tissue]
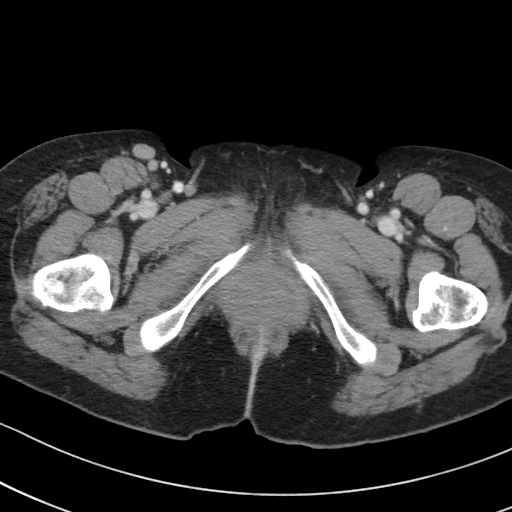
[im 5/77  bone]
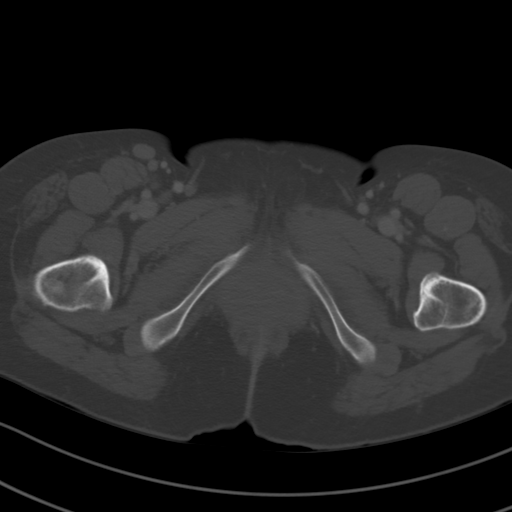
[im 10/77  soft-tissue]
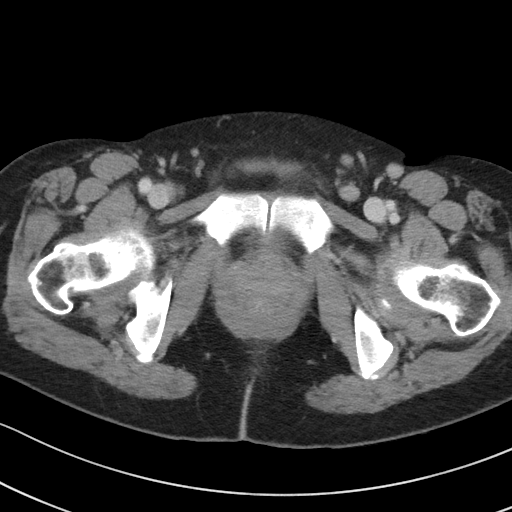
[im 20/77  soft-tissue]
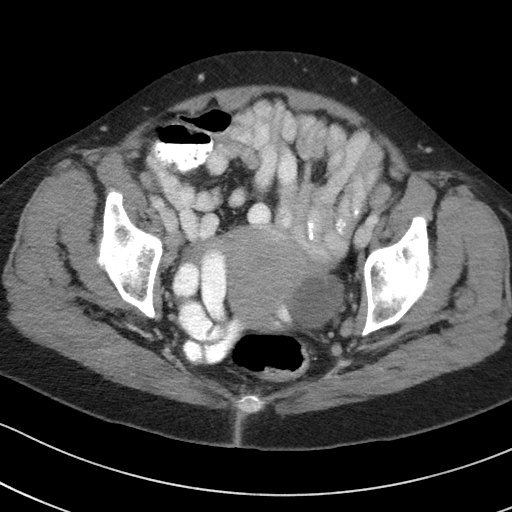
[im 24/77  soft-tissue]
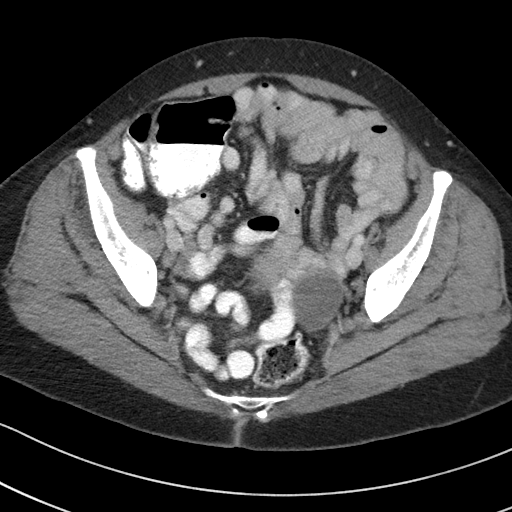
[im 29/77  soft-tissue]
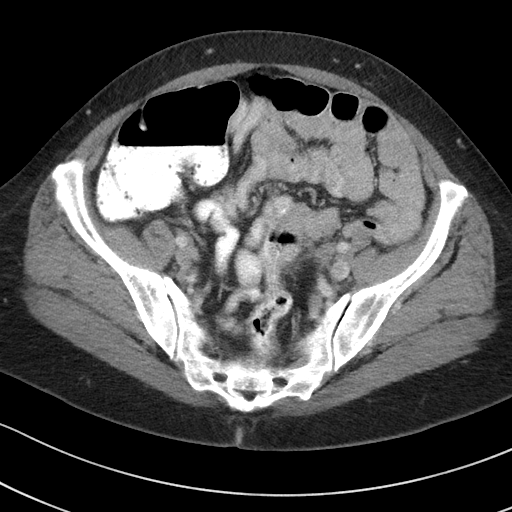
[im 34/77  soft-tissue]
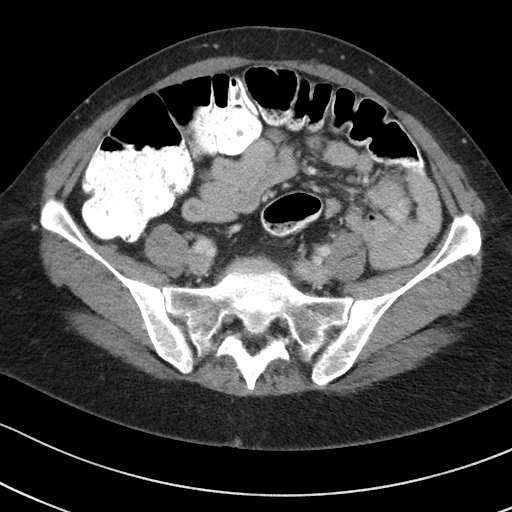
[im 43/77  soft-tissue]
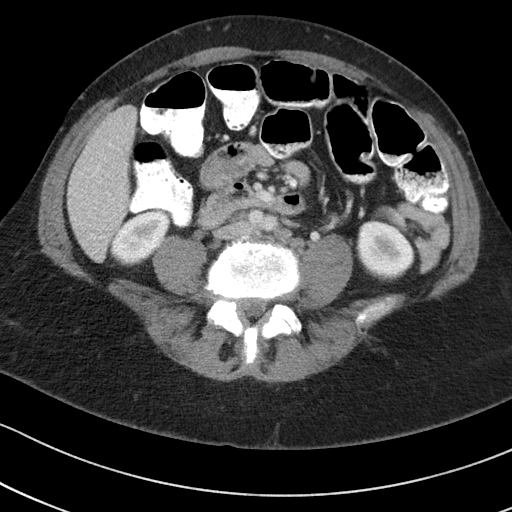
[im 48/77  soft-tissue]
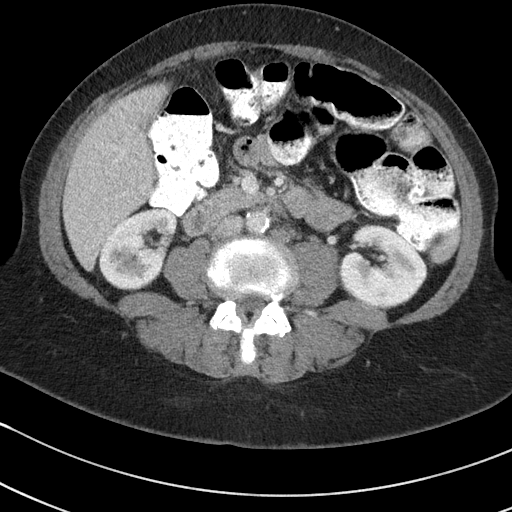
[im 53/77  soft-tissue]
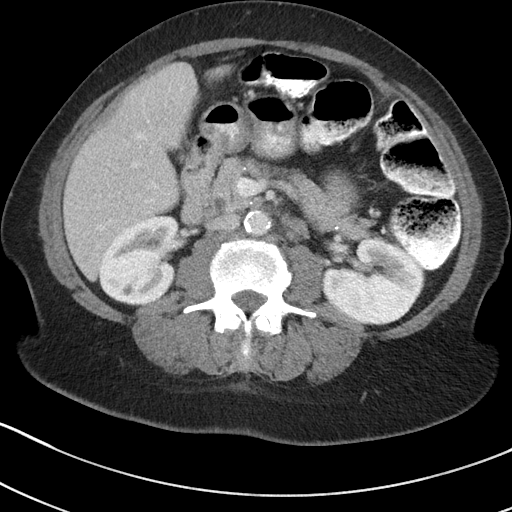
[im 53/77  bone]
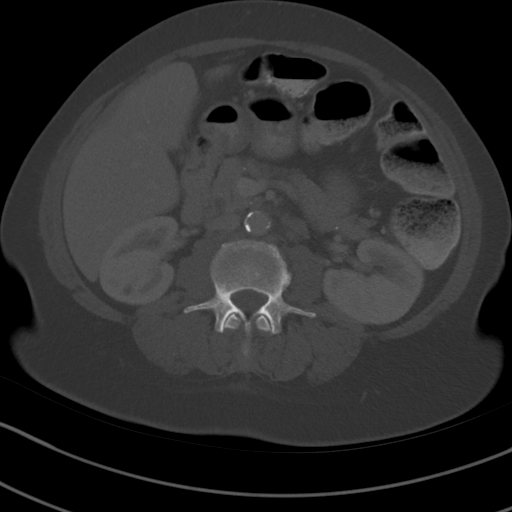
[im 58/77  soft-tissue]
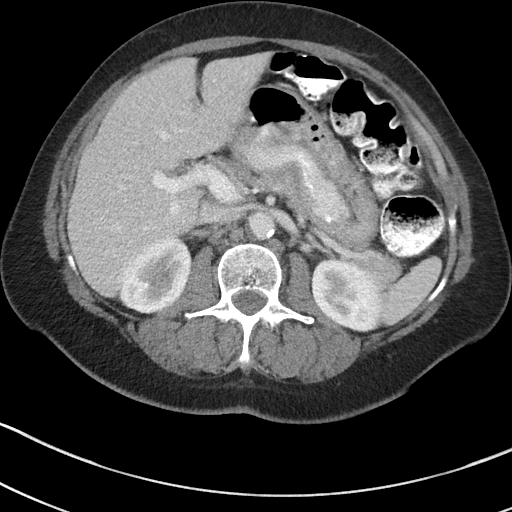
[im 67/77  soft-tissue]
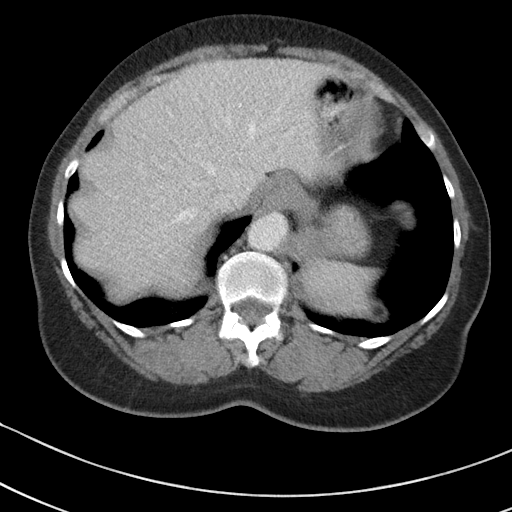
[im 72/77  soft-tissue]
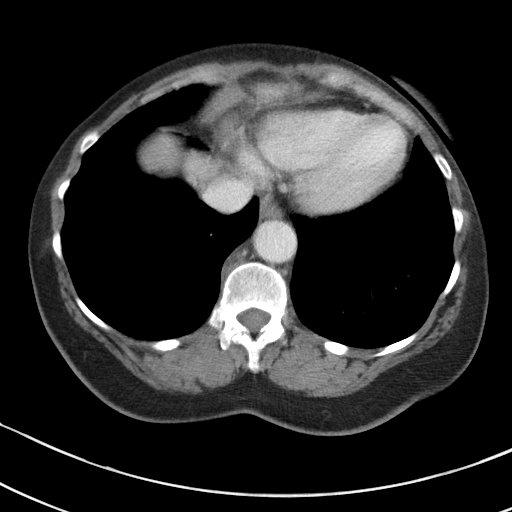

[Series 4: coronal st · coronal · 0.68mm/px · 3 of 94 slices shown]
[im 32/94  soft-tissue]
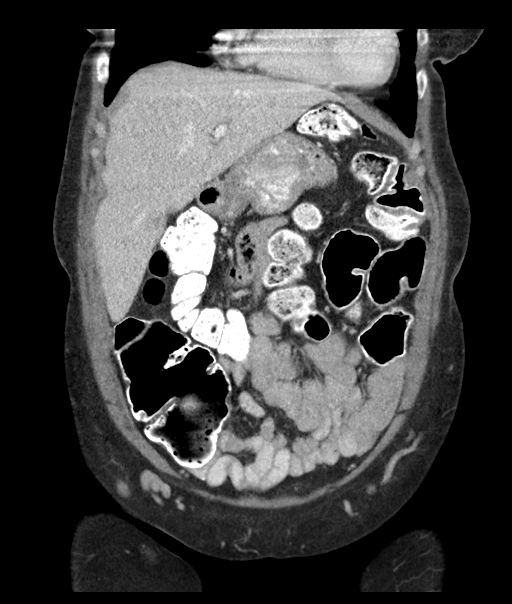
[im 42/94  soft-tissue]
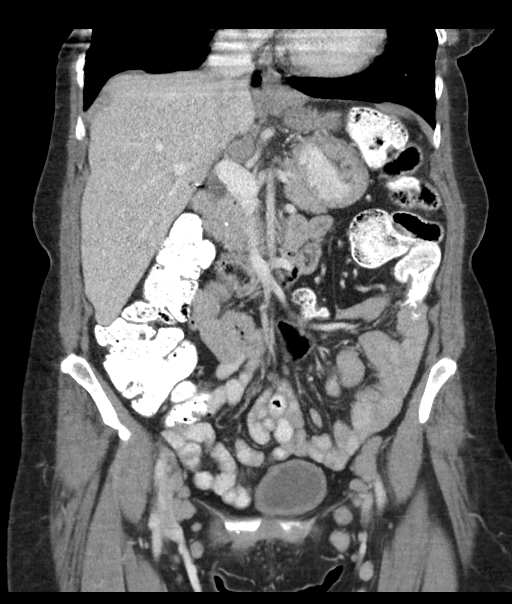
[im 52/94  soft-tissue]
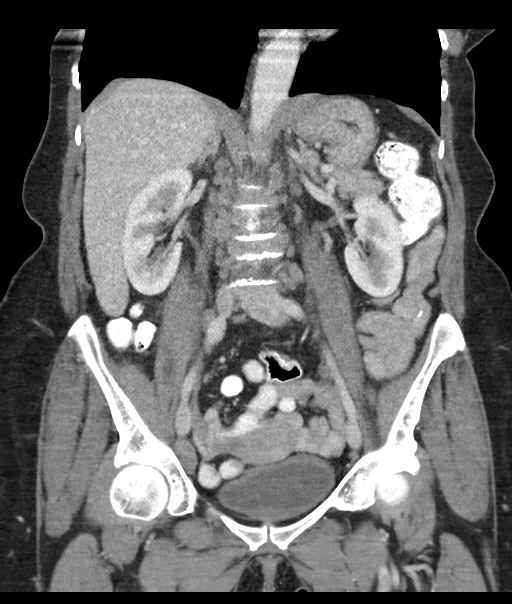

[15 of 46 positions shown; findings below may reference images not displayed]

FINDINGS: Lower chest: Motion degradation at the lung bases. Clear lung bases.
Normal heart size without pericardial or pleural effusion.

Hepatobiliary: Complex cyst or area of focal steatosis adjacent the
gallbladder fossa is unchanged, including at 1.5 cm. No suspicious
liver lesion. Cholecystectomy, without biliary ductal dilatation.

Pancreas: Pancreatic parenchymal calcifications, without duct
dilatation or dominant mass.

Spleen: Normal in size, without focal abnormality.

Adrenals/Urinary Tract: Normal adrenal glands. Normal left kidney.
Too small to characterize right renal lesion. No hydronephrosis.
Normal urinary bladder.

Stomach/Bowel: Normal stomach, without wall thickening. Normal colon
and terminal ileum. Appendix is not visualized but there is no
evidence of right lower quadrant inflammation. Normal small bowel.

Vascular/Lymphatic: Age advanced aortic and branch vessel
atherosclerosis. Retroperitoneal adenopathy. Index preaortic node
measures 9 mm on image 28/ series 2 and is unchanged. A node
adjacent to the aortic bifurcation measures 7 mm on image 34/ series
2 and is also not significantly changed.

Soft tissue fullness adjacent the aorta at the level of the
diaphragmatic hiatus is similar on image 12/ series 2 and favored to
be related to underdistention of the adjacent stomach. No
retrocrural adenopathy.

Pelvic sidewall adenopathy. Right external iliac node measures
cm on image 60/series 2 and is similar. Bilateral inguinal nodes. An
index left-sided inguinal node measures 11 mm on image 65/series 2
and is not significantly changed (when remeasured). No evidence of
deep venous thrombosis, with suboptimal venous opacification in the
pelvis.

Reproductive: Normal uterus. A left ovarian cystic lesion measures
4.0 cm on image 56/series 2 and is not significantly changed.

Other: No significant free fluid. No evidence of omental or
peritoneal disease.

Musculoskeletal: Degenerate disc disease at L4-5 and L5-S1.
IMPRESSION: 1. Similar abdominal pelvic adenopathy, as detailed above. No acute
explanation for lower extremity swelling.
2. Age advanced aortic atherosclerosis.
3. Persistent fluid density left adnexal/ovarian lesion is likely a
simple cyst. This could either be re-evaluated at followup or more
entirely characterized with dedicated pelvic ultrasound.

## 2019-06-02 ENCOUNTER — Ambulatory Visit: Payer: Medicaid Other | Admitting: Family Medicine

## 2019-11-22 ENCOUNTER — Encounter: Payer: Self-pay | Admitting: Family Medicine

## 2019-11-23 ENCOUNTER — Telehealth: Payer: Self-pay

## 2019-11-23 NOTE — Telephone Encounter (Signed)
CAP FORM  Noted Copied Sleeved

## 2019-12-12 ENCOUNTER — Encounter: Payer: Self-pay | Admitting: Family Medicine

## 2019-12-13 ENCOUNTER — Other Ambulatory Visit: Payer: Self-pay

## 2019-12-13 ENCOUNTER — Encounter: Payer: Self-pay | Admitting: Family Medicine

## 2019-12-13 ENCOUNTER — Ambulatory Visit (INDEPENDENT_AMBULATORY_CARE_PROVIDER_SITE_OTHER): Payer: Medicaid Other | Admitting: Family Medicine

## 2019-12-13 VITALS — BP 124/84 | HR 82 | Temp 97.1°F | Resp 15 | Ht 65.0 in | Wt 152.0 lb

## 2019-12-13 DIAGNOSIS — C9110 Chronic lymphocytic leukemia of B-cell type not having achieved remission: Secondary | ICD-10-CM | POA: Diagnosis not present

## 2019-12-13 DIAGNOSIS — E611 Iron deficiency: Secondary | ICD-10-CM

## 2019-12-13 DIAGNOSIS — R221 Localized swelling, mass and lump, neck: Secondary | ICD-10-CM

## 2019-12-13 DIAGNOSIS — Z83438 Family history of other disorder of lipoprotein metabolism and other lipidemia: Secondary | ICD-10-CM

## 2019-12-13 DIAGNOSIS — I10 Essential (primary) hypertension: Secondary | ICD-10-CM

## 2019-12-13 DIAGNOSIS — R591 Generalized enlarged lymph nodes: Secondary | ICD-10-CM

## 2019-12-13 DIAGNOSIS — M069 Rheumatoid arthritis, unspecified: Secondary | ICD-10-CM

## 2019-12-13 DIAGNOSIS — R7301 Impaired fasting glucose: Secondary | ICD-10-CM

## 2019-12-13 DIAGNOSIS — F331 Major depressive disorder, recurrent, moderate: Secondary | ICD-10-CM

## 2019-12-13 DIAGNOSIS — E559 Vitamin D deficiency, unspecified: Secondary | ICD-10-CM

## 2019-12-13 NOTE — Progress Notes (Signed)
Subjective:  Patient ID: [name], female    DOB: 07-06-1961  Age: 59 y.o. MRN: [id number]  CC:  Chief Complaint  Patient presents with  . cap PROGRAM    in to have forms completed for assistance       HPI  HPI  Ms. Tiano is a 59 year old female patient of Dr. Griffin Dakin.  He has not been seen in several years.  Presents here today with her son as he has been very worried and concerned over her lack of taking herself to get care that she needs.  Has an extensive history of anxiety, chronic lymphocytic leukemia, lymphadenopathy, lymphedema, hypertension among others.  Also has had psychosis.  Previously noted that she will not take her medications in the past when she was talked to about her psychosis.  Patient reports that she has memory trouble and son reports that she has memory trouble as well.  He would like to see if she can get into a program or have somebody that can help manage her care as he lives in [place] and is unable to move her closer to him at this time.  Today patient denies signs and symptoms of COVID 19 infection including fever, chills, cough, shortness of breath, and headache. Past Medical, Surgical, Social History, Allergies, and Medications have been Reviewed.   Past Medical History:  Diagnosis Date  . Anxiety   . Chronic abdominal pain    resolved  . Chronic lymphocytic leukemia (CLL), B-cell ([place]) 04/04/2016  . CLL (chronic lymphocytic leukemia) ([place])   . Depression with anxiety 07/07/2015  . H. pylori infection 2/09   s/p prevpac  . HAND PAIN, BILATERAL 01/29/2009   Qualifier: Diagnosis of  By: Moshe Cipro MD, Joycelyn Schmid    . Hypertension   . Iron deficiency 11/17/2016  . Lymphadenopathy 01/17/2016  . Nicotine addiction   . Psychosis [facility])    social worker states that patient does not like to talk about this and will stop her meds if you try and discuss this with her.  . 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    ROS:  Review of Systems  Constitutional: Negative.   HENT: Negative.   Eyes: Negative.   Respiratory: Negative.   Cardiovascular: Positive for leg swelling.  Gastrointestinal: Negative.   Genitourinary: Negative.   Musculoskeletal: Negative.   Skin: Negative.   Neurological: Negative.   Endo/Heme/Allergies: Negative.   Psychiatric/Behavioral: Positive for depression and memory loss.  All other systems reviewed and are negative.    Objective:   Today's Vitals: BP 124/84   Pulse 82   Temp (!) 97.1 F (36.2 C) (Temporal)   Resp 15   Ht 5\' 5"  (1.651 m)   Wt 152 lb (68.9 kg)   LMP 07/25/2016   SpO2 98%   BMI 25.29 kg/m  Vitals with BMI 12/13/2019 10/13/2017 10/13/2017  Height 5\' 5"  - -  Weight 152 lbs - -  BMI [id number] - -  Systolic [id number]  Diastolic 84 78 65  Pulse 82 82 89     Physical Exam Vitals and nursing note reviewed.  Constitutional:      Appearance: Normal appearance. She is well-developed and well-groomed.  HENT:     Head: Normocephalic and atraumatic.     Right Ear: External ear normal.     Left Ear: External ear normal.  Nose: Nose normal.     Mouth/Throat:     Mouth: Mucous membranes are moist.     Pharynx: Oropharynx is clear.  Eyes:     General:        Right eye: No discharge.        Left eye: No discharge.     Conjunctiva/sclera: Conjunctivae normal.  Cardiovascular:     Rate and Rhythm: Normal rate and regular rhythm.     Pulses: Normal pulses.     Heart sounds: Normal heart sounds.  Pulmonary:     Effort: Pulmonary effort is normal.     Breath sounds: Normal breath sounds.  Musculoskeletal:        General: Normal range of motion.     Cervical back: Normal range of motion and neck supple.     Right lower leg: 2+ Edema present.     Left lower leg: 2+ Edema present.  Lymphadenopathy:     Cervical: Cervical adenopathy present.  Skin:    General: Skin  is warm.  Neurological:     General: No focal deficit present.     Mental Status: She is alert and oriented to person, place, and time.  Psychiatric:        Attention and Perception: She is inattentive.        Mood and Affect: Mood is anxious. Affect is flat.        Speech: Speech is delayed.        Behavior: Behavior is withdrawn. Behavior is cooperative.        Cognition and Memory: Cognition normal.        Judgment: Judgment is inappropriate.     Assessment   1. Major depressive disorder, recurrent episode, moderate degree ([place])   2. Chronic lymphocytic leukemia of B-cell type not having achieved remission ([place])   3. Rheumatoid arthritis, involving unspecified site, unspecified whether rheumatoid factor present ([place])   4. Iron deficiency   5. Essential hypertension   6. Lymphadenopathy   7. Vitamin D deficiency   8. Impaired fasting glucose   9. Family history of hyperlipidemia   10. Mass of right side of neck     Tests ordered Orders Placed This Encounter  Procedures  . CBC with Differential/Platelet  . COMPLETE METABOLIC PANEL WITH GFR  . Hemoglobin A1c  . Lipid panel  . VITAMIN D 25 Hydroxy (Vit-D Deficiency, Fractures)  . Ambulatory referral to Oncology  . Ambulatory referral to Rheumatology    Plan: Please see assessment and plan per problem list above.   No orders of the defined types were placed in this encounter.   Patient to follow-up in 02/07/2020.  Perlie Mayo, NP

## 2019-12-13 NOTE — Patient Instructions (Signed)
I appreciate the opportunity to provide you with care for your health and wellness. Today we discussed: overall care   Follow up: 8 weeks   Labs today  Referrals to Oncology and Rheumatology   Please continue to practice social distancing to keep you, your family, and our community safe.  If you must go out, please wear a mask and practice good handwashing.  It was a pleasure to see you and I look forward to continuing to work together on your health and well-being. Please do not hesitate to call the office if you need care or have questions about your care.  Have a wonderful day and week. With Gratitude, Cherly Beach, DNP, AGNP-BC

## 2019-12-14 ENCOUNTER — Encounter: Payer: Self-pay | Admitting: Family Medicine

## 2019-12-14 DIAGNOSIS — Z83438 Family history of other disorder of lipoprotein metabolism and other lipidemia: Secondary | ICD-10-CM | POA: Insufficient documentation

## 2019-12-14 HISTORY — DX: Family history of other disorder of lipoprotein metabolism and other lipidemia: Z83.438

## 2019-12-14 LAB — CBC WITH DIFFERENTIAL/PLATELET
Absolute Monocytes: 673 cells/uL (ref 200–950)
Basophils Absolute: 48 cells/uL (ref 0–200)
Basophils Relative: 0.1 %
Eosinophils Absolute: 144 cells/uL (ref 15–500)
Eosinophils Relative: 0.3 %
HCT: 35.9 % (ref 35.0–45.0)
Hemoglobin: 11.8 g/dL (ref 11.7–15.5)
Lymphs Abs: 43482 cells/uL — ABNORMAL HIGH (ref 850–3900)
MCH: 29.9 pg (ref 27.0–33.0)
MCHC: 32.9 g/dL (ref 32.0–36.0)
MCV: 90.9 fL (ref 80.0–100.0)
MPV: 10.9 fL (ref 7.5–12.5)
Monocytes Relative: 1.4 %
Neutro Abs: 3752 cells/uL (ref 1500–7800)
Neutrophils Relative %: 7.8 %
Platelets: 254 10*3/uL (ref 140–400)
RBC: 3.95 10*6/uL (ref 3.80–5.10)
RDW: 14.3 % (ref 11.0–15.0)
Total Lymphocyte: 90.4 %
WBC: 48.1 10*3/uL — ABNORMAL HIGH (ref 3.8–10.8)

## 2019-12-14 LAB — COMPLETE METABOLIC PANEL WITH GFR
AG Ratio: 1.6 (calc) (ref 1.0–2.5)
ALT: 12 U/L (ref 6–29)
AST: 20 U/L (ref 10–35)
Albumin: 4.4 g/dL (ref 3.6–5.1)
Alkaline phosphatase (APISO): 162 U/L — ABNORMAL HIGH (ref 37–153)
BUN: 16 mg/dL (ref 7–25)
CO2: 25 mmol/L (ref 20–32)
Calcium: 9.9 mg/dL (ref 8.6–10.4)
Chloride: 107 mmol/L (ref 98–110)
Creat: 0.72 mg/dL (ref 0.50–1.05)
GFR, Est African American: 106 mL/min/{1.73_m2} (ref >=60)
GFR, Est Non African American: 92 mL/min/{1.73_m2} (ref >=60)
Globulin: 2.8 g/dL (calc) (ref 1.9–3.7)
Glucose, Bld: 82 mg/dL (ref 65–99)
Potassium: 3.3 mmol/L — ABNORMAL LOW (ref 3.5–5.3)
Sodium: 143 mmol/L (ref 135–146)
Total Bilirubin: 0.4 mg/dL (ref 0.2–1.2)
Total Protein: 7.2 g/dL (ref 6.1–8.1)

## 2019-12-14 LAB — LIPID PANEL
Cholesterol: 197 mg/dL (ref <200)
HDL: 42 mg/dL — ABNORMAL LOW (ref >=50)
LDL Cholesterol (Calc): 138 mg/dL (calc) — ABNORMAL HIGH
Non-HDL Cholesterol (Calc): 155 mg/dL (calc) — ABNORMAL HIGH (ref <130)
Total CHOL/HDL Ratio: 4.7 (calc) (ref <5.0)
Triglycerides: 75 mg/dL (ref <150)

## 2019-12-14 LAB — HEMOGLOBIN A1C
Hgb A1c MFr Bld: 5.9 % of total Hgb — ABNORMAL HIGH (ref <5.7)
Mean Plasma Glucose: 123 (calc)
eAG (mmol/L): 6.8 (calc)

## 2019-12-14 LAB — VITAMIN D 25 HYDROXY (VIT D DEFICIENCY, FRACTURES): Vit D, 25-Hydroxy: 15 ng/mL — ABNORMAL LOW (ref 30–100)

## 2019-12-14 NOTE — Assessment & Plan Note (Signed)
Needs psychiatry follow up. She has been resistant to treatment in past. Denies SI and HI today.  Will refer once oncology and rheum have seen her. Trying not to overload her right away.

## 2019-12-14 NOTE — Assessment & Plan Note (Signed)
Several swollen lymph nodes bilateral on neck. Referral back to oncology for CLL

## 2019-12-14 NOTE — Assessment & Plan Note (Signed)
CLL-has not been treated in years. Needs follow up-referral mad/Labs ordred

## 2019-12-14 NOTE — Assessment & Plan Note (Signed)
Getting updated labs ° °

## 2019-12-14 NOTE — Assessment & Plan Note (Signed)
Referral made for this.

## 2019-12-14 NOTE — Assessment & Plan Note (Signed)
Widespread from CT back in 2017. Referral for Oncology follow for CLL.

## 2019-12-14 NOTE — Assessment & Plan Note (Signed)
Needs updated labs.

## 2019-12-15 ENCOUNTER — Other Ambulatory Visit: Payer: Self-pay

## 2019-12-15 MED ORDER — VITAMIN D (ERGOCALCIFEROL) 1.25 MG (50000 UNIT) PO CAPS: 50000.0000 [IU] | ORAL_CAPSULE | ORAL | 1 refills | Status: AC

## 2019-12-19 ENCOUNTER — Encounter (HOSPITAL_COMMUNITY): Payer: Self-pay | Admitting: Hematology

## 2019-12-19 ENCOUNTER — Inpatient Hospital Stay (HOSPITAL_COMMUNITY): Payer: Medicaid Other | Attending: Hematology | Admitting: Hematology

## 2019-12-19 ENCOUNTER — Other Ambulatory Visit: Payer: Self-pay

## 2019-12-19 VITALS — BP 133/80 | HR 98 | Temp 97.5°F | Resp 20 | Wt 149.0 lb

## 2019-12-19 DIAGNOSIS — F17210 Nicotine dependence, cigarettes, uncomplicated: Secondary | ICD-10-CM | POA: Diagnosis not present

## 2019-12-19 DIAGNOSIS — R591 Generalized enlarged lymph nodes: Secondary | ICD-10-CM

## 2019-12-19 DIAGNOSIS — M069 Rheumatoid arthritis, unspecified: Secondary | ICD-10-CM | POA: Insufficient documentation

## 2019-12-19 DIAGNOSIS — I10 Essential (primary) hypertension: Secondary | ICD-10-CM | POA: Insufficient documentation

## 2019-12-19 DIAGNOSIS — C9110 Chronic lymphocytic leukemia of B-cell type not having achieved remission: Secondary | ICD-10-CM | POA: Insufficient documentation

## 2019-12-19 DIAGNOSIS — G8929 Other chronic pain: Secondary | ICD-10-CM | POA: Diagnosis not present

## 2019-12-19 DIAGNOSIS — E611 Iron deficiency: Secondary | ICD-10-CM

## 2019-12-19 DIAGNOSIS — E559 Vitamin D deficiency, unspecified: Secondary | ICD-10-CM | POA: Insufficient documentation

## 2019-12-19 NOTE — Patient Instructions (Addendum)
[place] at [facility] Discharge Instructions  You were seen today by Dr. Delton Coombes. He went over your recent lab results. We will schedule you for a CT scan and more lab work on the same day.  He will see you back after your CT scan and labs for a follow up.   Thank you for choosing [place] at [facility] to provide your oncology and hematology care.  To afford each patient quality time with our provider, please arrive at least 15 minutes before your scheduled appointment time.   If you have a lab appointment with the [facility] please come in thru the  Main Entrance and check in at the main information desk  You need to re-schedule your appointment should you arrive 10 or more minutes late.  We strive to give you quality time with our providers, and arriving late affects you and other patients whose appointments are after yours.  Also, if you no show three or more times for appointments you may be dismissed from the clinic at the providers discretion.     Again, thank you for choosing [facility].  Our hope is that these requests will decrease the amount of time that you wait before being seen by our physicians.       _____________________________________________________________  Should you have questions after your visit to [facility], please contact our office at [phone] between the hours of 8:00 a.m. and 4:30 p.m.  Voicemails left after 4:00 p.m. will not be returned until the following business day.  For prescription refill requests, have your pharmacy contact our office and allow 72 hours.    Cancer Center Support Programs:   > Cancer Support Group  2nd Tuesday of the month 1pm-2pm, Journey Room

## 2019-12-19 NOTE — Assessment & Plan Note (Signed)
1.  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 -Patient last seen in our clinic in January 2019 and was lost to follow-up. -Her recent CBC showed white count of 48, up from 15 K in 2019. -She denied any fevers, night sweats or weight loss in the last 6 months.  No recurrent infections or hospitalizations. -She reported pain under her arms and back of the neck which is new in the last few weeks. -She also has chronic pains in the fingers and hands from underlying rheumatoid arthritis. -We have reviewed labs from 12/13/2019.  Elevated white count is predominantly lymphocytes. -Physical examination reveals lymphadenopathy in the bilateral neck region, bilateral axillary, left more than right, bilateral inguinal, left more than right. -I have recommended LDH and uric acid.  We will also check CLL FISH panel. -I have recommended CT scan of the chest, abdomen and pelvis with contrast. -We briefly talked about treatment indications and treatment options.  2.  Vitamin D deficiency: -She is on vitamin D 50,000 units weekly.

## 2019-12-19 NOTE — Progress Notes (Signed)
[place], [place] [address]   CLINIC:  Medical Oncology/Hematology  PCP:  Fayrene Helper, MD [address] [place] [address] [phone]   REASON FOR VISIT:  Follow-up for CLL/SLL  CURRENT THERAPY: Observation.   INTERVAL HISTORY:  Ms. Robers 59 y.o. female is seen today for follow-up of CLL/SLL.  She was last seen in our clinic in early 2019.  She has lost follow-up since then.  She is accompanied by her son today.  She denies any fevers, night sweats or weight loss in the last 6 months.  No infections or hospitalizations.  Reports pain under her arms and back of the neck which is new.  Also has pains in the fingers and hands from rheumatoid arthritis.  She lives at home by herself and is independent of ADLs.  She does not drive.  She does all her household activities.  Appetite is 50%.  Energy levels are low.    REVIEW OF SYSTEMS:  Review of Systems  Neurological: Positive for numbness.  All other systems reviewed and are negative.    PAST MEDICAL/SURGICAL HISTORY:  Past Medical History:  Diagnosis Date  . Anxiety   . Chronic abdominal pain    resolved  . Chronic lymphocytic leukemia (CLL), B-cell ([place]) 04/04/2016  . CLL (chronic lymphocytic leukemia) ([place])   . Depression with anxiety 07/07/2015  . H. pylori infection 2/09   s/p prevpac  . HAND PAIN, BILATERAL 01/29/2009   Qualifier: Diagnosis of  By: Moshe Cipro MD, Joycelyn Schmid    . Hypertension   . Iron deficiency 11/17/2016  . Lymphadenopathy 01/17/2016  . Nicotine addiction   . Psychosis [facility])    social worker states that patient does not like to talk about this and will stop her meds if you try and discuss this with her.  . 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 AXILLARY LYMPH NODE BIOPSY Right 03/28/2016   Procedure: RIGHT AXILLARY LYMPH NODE BIOPSY;  Surgeon: Aviva Signs, MD;  Location: [facility] ORS;  Service:  General;  Laterality: Right;  . CHOLECYSTECTOMY    . COLONOSCOPY  02/07/2011   SLF: Slightly tortuous colon.  Otherwise normal colon without evidence of polyps, masses, inflammatory changes, diverticular AVMs  . HAMMER TOE SURGERY Left   . HYSTEROSCOPY WITH D & C N/A 07/30/2016   Procedure: DILATATION AND CURETTAGE /HYSTEROSCOPY;  Surgeon: Florian Buff, MD;  Location: [facility] ORS;  Service: Gynecology;  Laterality: N/A;  . Surgery of left middle finger on left hand (childhood )    . TUBAL LIGATION       SOCIAL HISTORY:  Social History   Socioeconomic History  . Marital status: Single    Spouse name: Not on file  . Number of children: 2  . Years of education: Not on file  . Highest education level: Not on file  Occupational History  . Occupation: disabled  Tobacco Use  . Smoking status: Light Tobacco Smoker    Packs/day: 0.25    Years: 16.00    Pack years: 4.00    Types: Cigarettes  . Smokeless tobacco: Never Used  . Tobacco comment: 5 per day   Substance and Sexual Activity  . Alcohol use: Yes    Alcohol/week: 0.0 standard drinks    Comment: occ  . Drug use: No  . Sexual activity: Never    Birth control/protection: Surgical  Other Topics Concern  .  Not on file  Social History Narrative   Lives alone   Social Determinants of Health   Financial Resource Strain:   . Difficulty of Paying Living Expenses:   Food Insecurity:   . Worried About Charity fundraiser in the Last Year:   . Arboriculturist in the Last Year:   Transportation Needs:   . Film/video editor (Medical):   Marland Kitchen Lack of Transportation (Non-Medical):   Physical Activity:   . Days of Exercise per Week:   . Minutes of Exercise per Session:   Stress:   . Feeling of Stress :   Social Connections:   . Frequency of Communication with Friends and Family:   . Frequency of Social Gatherings with Friends and Family:   . Attends Religious Services:   . Active Member of Clubs or Organizations:   . Attends  Archivist Meetings:   Marland Kitchen Marital Status:   Intimate Partner Violence:   . Fear of Current or Ex-Partner:   . Emotionally Abused:   Marland Kitchen Physically Abused:   . Sexually Abused:     FAMILY HISTORY:  Family History  Problem Relation Age of Onset  . Cancer Mother     CURRENT MEDICATIONS:  Outpatient Encounter Medications as of 12/19/2019  Medication Sig  . Vitamin D, Ergocalciferol, (DRISDOL) 1.25 MG (50000 UNIT) CAPS capsule Take 1 capsule (50,000 Units total) by mouth every 7 (seven) days.  . [DISCONTINUED] fluticasone (FLONASE) 50 MCG/ACT nasal spray Place 1 spray into the nose daily.   No facility-administered encounter medications on file as of 12/19/2019.    ALLERGIES:  Allergies  Allergen Reactions  . Feraheme [Ferumoxytol] Shortness Of Breath and Swelling     PHYSICAL EXAM:  ECOG Performance status: 1  Vitals:   12/19/19 1604  BP: 133/80  Pulse: 98  Resp: 20  Temp: (!) 97.5 F (36.4 C)  SpO2: 100%   Filed Weights   12/19/19 1604  Weight: 149 lb (67.6 kg)    Physical Exam Vitals reviewed.  Constitutional:      Appearance: Normal appearance.  Cardiovascular:     Rate and Rhythm: Normal rate and regular rhythm.     Heart sounds: Normal heart sounds.  Pulmonary:     Effort: Pulmonary effort is normal.     Breath sounds: Normal breath sounds.  Abdominal:     General: There is no distension.     Palpations: Abdomen is soft. There is no mass.  Lymphadenopathy:     Cervical: Cervical adenopathy present.  Skin:    General: Skin is warm.  Neurological:     General: No focal deficit present.     Mental Status: She is alert and oriented to person, place, and time.  Psychiatric:        Mood and Affect: Mood normal.        Behavior: Behavior normal.    Bilateral axillary lymphadenopathy, left side more than right side.  Bilateral inguinal adenopathy left side more than right side.  LABORATORY DATA:  I have reviewed the labs as listed.  CBC      Component Value Date/Time   WBC 48.1 (H) 12/13/2019 1151   RBC 3.95 12/13/2019 1151   HGB 11.8 12/13/2019 1151   HCT 35.9 12/13/2019 1151   PLT 254 12/13/2019 1151   MCV 90.9 12/13/2019 1151   MCH 29.9 12/13/2019 1151   MCHC 32.9 12/13/2019 1151   RDW 14.3 12/13/2019 1151   LYMPHSABS 43,482 (H) 12/13/2019 1151  MONOABS 0.4 09/23/2017 1257   EOSABS 144 12/13/2019 1151   BASOSABS 48 12/13/2019 1151   CMP Latest Ref Rng & Units 12/13/2019 09/23/2017 05/25/2017  Glucose 65 - 99 mg/dL 82 97 100(H)  BUN 7 - 25 mg/dL 16 12 17   Creatinine 0.50 - 1.05 mg/dL 0.72 0.61 0.59  Sodium 135 - 146 mmol/L 143 140 140  Potassium 3.5 - 5.3 mmol/L 3.3(L) 3.4(L) 3.3(L)  Chloride 98 - 110 mmol/L 107 106 106  CO2 20 - 32 mmol/L 25 24 24   Calcium 8.6 - 10.4 mg/dL 9.9 9.6 9.4  Total Protein 6.1 - 8.1 g/dL 7.2 7.9 7.6  Total Bilirubin 0.2 - 1.2 mg/dL 0.4 0.3 0.3  Alkaline Phos 38 - 126 U/L - 170(H) 141(H)  AST 10 - 35 U/L 20 32 25  ALT 6 - 29 U/L 12 21 14        DIAGNOSTIC IMAGING:  I have independently reviewed the scans and discussed with the patient.     ASSESSMENT & PLAN:   Chronic lymphocytic leukemia (CLL), B-cell ([facility]) 1.  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 -Patient last seen in our clinic in January 2019 and was lost to follow-up. -Her recent CBC showed white count of 48, up from 15 K in 2019. -She denied any fevers, night sweats or weight loss in the last 6 months.  No recurrent infections or hospitalizations. -She reported pain under her arms and back of the neck which is new in the last few weeks. -She also has chronic pains in the fingers and hands from underlying rheumatoid arthritis. -We have reviewed labs from 12/13/2019.  Elevated white count is predominantly lymphocytes. -Physical examination reveals lymphadenopathy in the bilateral neck region, bilateral axillary, left more than right, bilateral inguinal, left more than  right. -I have recommended LDH and uric acid.  We will also check CLL FISH panel. -I have recommended CT scan of the chest, abdomen and pelvis with contrast. -We briefly talked about treatment indications and treatment options.  2.  Vitamin D deficiency: -She is on vitamin D 50,000 units weekly.      Orders placed this encounter:  Orders Placed This Encounter  Procedures  . CT Abdomen Pelvis W Contrast  . Lactate dehydrogenase  . Uric acid  . FISH HES [place], MD [place] [phone]

## 2019-12-19 NOTE — Progress Notes (Deleted)
CONSULT NOTE  Patient Care Team: Fayrene Helper, MD as PCP - General Fields, Marga Melnick, MD (Gastroenterology) Ang, Abigail Butts, MD as Referring Physician (Internal Medicine) Whitney Muse, Kelby Fam, MD (Inactive) as Referring Physician (Hematology and Oncology)  CHIEF COMPLAINTS/PURPOSE OF CONSULTATION:  ***  HISTORY OF PRESENTING ILLNESS:  [name] 59 y.o. female is here because of ***  ***She was found to have abnormal CBC from *** ***She denies recent chest pain on exertion, shortness of breath on minimal exertion, pre-syncopal episodes, or palpitations. ***She had not noticed any recent bleeding such as epistaxis, hematuria or hematochezia ***The patient denies over the counter NSAID ingestion. She is not *** on antiplatelets agents. Her last colonoscopy was *** ***She had no prior history or diagnosis of cancer. Her age appropriate screening programs are up-to-date. ***She denies any pica and eats a variety of diet. ***She never donated blood or received blood transfusion ***The patient was prescribed oral iron supplements and she takes ***  MEDICAL HISTORY:  Past Medical History:  Diagnosis Date  . Anxiety   . Chronic abdominal pain    resolved  . Chronic lymphocytic leukemia (CLL), B-cell ([place]) 04/04/2016  . CLL (chronic lymphocytic leukemia) ([place])   . Depression with anxiety 07/07/2015  . H. pylori infection 2/09   s/p prevpac  . HAND PAIN, BILATERAL 01/29/2009   Qualifier: Diagnosis of  By: Moshe Cipro MD, Joycelyn Schmid    . Hypertension   . Iron deficiency 11/17/2016  . Lymphadenopathy 01/17/2016  . Nicotine addiction   . Psychosis [facility])    social worker states that patient does not like to talk about this and will stop her meds if you try and discuss this with her.  . 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 AXILLARY LYMPH NODE BIOPSY Right 03/28/2016   Procedure: RIGHT AXILLARY LYMPH NODE BIOPSY;   Surgeon: Aviva Signs, MD;  Location: [facility];  Service: General;  Laterality: Right;  . CHOLECYSTECTOMY    . COLONOSCOPY  02/07/2011   SLF: Slightly tortuous colon.  Otherwise normal colon without evidence of polyps, masses, inflammatory changes, diverticular AVMs  . HAMMER TOE SURGERY Left   . HYSTEROSCOPY WITH D & C N/A 07/30/2016   Procedure: DILATATION AND CURETTAGE /HYSTEROSCOPY;  Surgeon: Florian Buff, MD;  Location: [facility];  Service: Gynecology;  Laterality: N/A;  . Surgery of left middle finger on left hand (childhood )    . TUBAL LIGATION      SOCIAL HISTORY: Social History   Socioeconomic History  . Marital status: Single    Spouse name: Not on file  . Number of children: 2  . Years of education: Not on file  . Highest education level: Not on file  Occupational History  . Occupation: disabled  Tobacco Use  . Smoking status: Light Tobacco Smoker    Packs/day: 0.25    Years: 16.00    Pack years: 4.00    Types: Cigarettes  . Smokeless tobacco: Never Used  . Tobacco comment: 5 per day   Substance and Sexual Activity  . Alcohol use: Yes    Alcohol/week: 0.0 standard drinks    Comment: occ  . Drug use: No  . Sexual activity: Never    Birth control/protection: Surgical  Other Topics Concern  . Not on file  Social History Narrative   Lives alone   Social Determinants of Health   Financial Resource Strain:   . Difficulty  of Paying Living Expenses:   Food Insecurity:   . Worried About Charity fundraiser in the Last Year:   . Arboriculturist in the Last Year:   Transportation Needs:   . Film/video editor (Medical):   Marland Kitchen Lack of Transportation (Non-Medical):   Physical Activity:   . Days of Exercise per Week:   . Minutes of Exercise per Session:   Stress:   . Feeling of Stress :   Social Connections:   . Frequency of Communication with Friends and Family:   . Frequency of Social Gatherings with Friends and Family:   . Attends Religious Services:   .  Active Member of Clubs or Organizations:   . Attends Archivist Meetings:   Marland Kitchen Marital Status:   Intimate Partner Violence:   . Fear of Current or Ex-Partner:   . Emotionally Abused:   Marland Kitchen Physically Abused:   . Sexually Abused:     FAMILY HISTORY: Family History  Problem Relation Age of Onset  . Cancer Mother     ALLERGIES:  is allergic to feraheme [ferumoxytol].  MEDICATIONS:  Current Outpatient Medications  Medication Sig Dispense Refill  . Vitamin D, Ergocalciferol, (DRISDOL) 1.25 MG (50000 UNIT) CAPS capsule Take 1 capsule (50,000 Units total) by mouth every 7 (seven) days. 12 capsule 1   No current facility-administered medications for this visit.    REVIEW OF SYSTEMS:   Constitutional: Denies fevers, chills or abnormal night sweats Eyes: Denies blurriness of vision, double vision or watery eyes Ears, nose, mouth, throat, and face: Denies mucositis or sore throat Respiratory: Denies cough, dyspnea or wheezes Cardiovascular: Denies palpitation, chest discomfort or lower extremity swelling Gastrointestinal:  Denies nausea, heartburn or change in bowel habits Skin: Denies abnormal skin rashes Lymphatics: Denies new lymphadenopathy or easy bruising Neurological:Denies numbness, tingling or new weaknesses Behavioral/Psych: Mood is stable, no new changes  All other systems were reviewed with the patient and are negative.  PHYSICAL EXAMINATION: ECOG PERFORMANCE STATUS: {CHL ONC ECOG [id number]}  Vitals:   12/19/19 1604  BP: 133/80  Pulse: 98  Resp: 20  Temp: (!) 97.5 F (36.4 C)  SpO2: 100%   Filed Weights   12/19/19 1604  Weight: 149 lb (67.6 kg)    GENERAL:alert, no distress and comfortable SKIN: skin color, texture, turgor are normal, no rashes or significant lesions EYES: normal, conjunctiva are pink and non-injected, sclera clear OROPHARYNX:no exudate, no erythema and lips, buccal mucosa, and tongue normal  NECK: supple, thyroid normal size,  non-tender, without nodularity LYMPH:  no palpable lymphadenopathy in the cervical, axillary or inguinal LUNGS: clear to auscultation and percussion with normal breathing effort HEART: regular rate & rhythm and no murmurs and no lower extremity edema ABDOMEN:abdomen soft, non-tender and normal bowel sounds Musculoskeletal:no cyanosis of digits and no clubbing  PSYCH: alert & oriented x 3 with fluent speech NEURO: no focal motor/sensory deficits  LABORATORY DATA:  I have reviewed the data as listed Recent Results (from the past 2160 hour(s))  CBC with Differential/Platelet     Status: Abnormal   Collection Time: 12/13/19 11:51 AM  Result Value Ref Range   WBC 48.1 (H) 3.8 - 10.8 Thousand/uL   RBC 3.95 3.80 - 5.10 Million/uL   Hemoglobin 11.8 11.7 - 15.5 g/dL   HCT 35.9 35.0 - 45.0 %   MCV 90.9 80.0 - 100.0 fL   MCH 29.9 27.0 - 33.0 pg   MCHC 32.9 32.0 - 36.0 g/dL   RDW 14.3  11.0 - 15.0 %   Platelets 254 140 - 400 Thousand/uL   MPV 10.9 7.5 - 12.5 fL   Neutro Abs 3,752 1,500 - 7,800 cells/uL   Lymphs Abs 43,482 (H) 850 - 3,900 cells/uL   Absolute Monocytes 673 200 - 950 cells/uL   Eosinophils Absolute 144 15 - 500 cells/uL   Basophils Absolute 48 0 - 200 cells/uL   Neutrophils Relative % 7.8 %   Total Lymphocyte 90.4 %   Monocytes Relative 1.4 %   Eosinophils Relative 0.3 %   Basophils Relative 0.1 %   Smear Review      Comment: Absolute lymphocytosis and atypical lymphocytes, consistent with a lymphoproliferative disorder. Suggest immunophenotyping by flow cytometry for further evaluation. Smudge Cells Platelets are unremarkable. Normocytic/Normochromic anemia with unremarkable RBC morphology. Reviewed by Odis Hollingshead, MD, PhD, FCAP (Electronic Signature on File)    12/14/2019.   COMPLETE METABOLIC PANEL WITH GFR     Status: Abnormal   Collection Time: 12/13/19 11:51 AM  Result Value Ref Range   Glucose, Bld 82 65 - 99 mg/dL    Comment: .            Fasting  reference interval .    BUN 16 7 - 25 mg/dL   Creat 0.72 0.50 - 1.05 mg/dL    Comment: For patients >31 years of age, the reference limit for Creatinine is approximately 13% higher for people identified as African-American. .    GFR, Est Non African American 92 > OR = 60 mL/min/1.50m2   GFR, Est African American 106 > OR = 60 mL/min/1.5m2   BUN/Creatinine Ratio NOT APPLICABLE 6 - 22 (calc)   Sodium 143 135 - 146 mmol/L   Potassium 3.3 (L) 3.5 - 5.3 mmol/L   Chloride 107 98 - 110 mmol/L   CO2 25 20 - 32 mmol/L   Calcium 9.9 8.6 - 10.4 mg/dL   Total Protein 7.2 6.1 - 8.1 g/dL   Albumin 4.4 3.6 - 5.1 g/dL   Globulin 2.8 1.9 - 3.7 g/dL (calc)   AG Ratio 1.6 1.0 - 2.5 (calc)   Total Bilirubin 0.4 0.2 - 1.2 mg/dL   Alkaline phosphatase (APISO) 162 (H) 37 - 153 U/L   AST 20 10 - 35 U/L   ALT 12 6 - 29 U/L  Hemoglobin A1c     Status: Abnormal   Collection Time: 12/13/19 11:51 AM  Result Value Ref Range   Hgb A1c MFr Bld 5.9 (H) <5.7 % of total Hgb    Comment: For someone without known diabetes, a hemoglobin  A1c value between 5.7% and 6.4% is consistent with prediabetes and should be confirmed with a  follow-up test. . For someone with known diabetes, a value <7% indicates that their diabetes is well controlled. A1c targets should be individualized based on duration of diabetes, age, comorbid conditions, and other considerations. . This assay result is consistent with an increased risk of diabetes. . Currently, no consensus exists regarding use of hemoglobin A1c for diagnosis of diabetes for children. .    Mean Plasma Glucose 123 (calc)   eAG (mmol/L) 6.8 (calc)  Lipid panel     Status: Abnormal   Collection Time: 12/13/19 11:51 AM  Result Value Ref Range   Cholesterol 197 <200 mg/dL   HDL 42 (L) > OR = 50 mg/dL   Triglycerides 75 <150 mg/dL   LDL Cholesterol (Calc) 138 (H) mg/dL (calc)    Comment: Reference range: <100 . Desirable range <100  mg/dL for primary  prevention;   <70 mg/dL for patients with CHD or diabetic patients  with > or = 2 CHD risk factors. Marland Kitchen LDL-C is now calculated using the Martin-Hopkins  calculation, which is a validated novel method providing  better accuracy than the Friedewald equation in the  estimation of LDL-C.  Cresenciano Genre et al. Annamaria Helling. [id number]): 2061-2068  (http://education.[facility].com/faq/FAQ164)    Total CHOL/HDL Ratio 4.7 <5.0 (calc)   Non-HDL Cholesterol (Calc) 155 (H) <130 mg/dL (calc)    Comment: For patients with diabetes plus 1 major ASCVD risk  factor, treating to a non-HDL-C goal of <100 mg/dL  (LDL-C of <70 mg/dL) is considered a therapeutic  option.   VITAMIN D 25 Hydroxy (Vit-D Deficiency, Fractures)     Status: Abnormal   Collection Time: 12/13/19 11:51 AM  Result Value Ref Range   Vit D, 25-Hydroxy 15 (L) 30 - 100 ng/mL    Comment: Vitamin D Status         25-OH Vitamin D: . Deficiency:                    <20 ng/mL Insufficiency:             20 - 29 ng/mL Optimal:                 > or = 30 ng/mL . For 25-OH Vitamin D testing on patients on  D2-supplementation and patients for whom quantitation  of D2 and D3 fractions is required, the QuestAssureD(TM) 25-OH VIT D, (D2,D3), LC/MS/MS is recommended: order  code [phone] (patients >11yrs). See Note 1 . Note 1 . For additional information, please refer to  http://education.[facility].com/faq/FAQ199  (This link is being provided for informational/ educational purposes only.)     RADIOGRAPHIC STUDIES: I have personally reviewed the radiological images as listed and agreed with the findings in the report. No results found.  ASSESSMENT & PLAN:  No problem-specific Assessment & Plan notes found for this encounter.     All questions were answered. The patient knows to call the clinic with any problems, questions or concerns. I spent {CHL ONC TIME VISIT - [id number] counseling the patient face to face. The total time  spent in the appointment was {CHL ONC TIME VISIT - [id number] and more than 50% was on counseling.     Tally Due, LPN [id number] PM

## 2019-12-26 ENCOUNTER — Other Ambulatory Visit (HOSPITAL_COMMUNITY): Payer: Self-pay | Admitting: *Deleted

## 2019-12-26 DIAGNOSIS — C9110 Chronic lymphocytic leukemia of B-cell type not having achieved remission: Secondary | ICD-10-CM

## 2020-01-02 ENCOUNTER — Encounter: Payer: Self-pay | Admitting: Family Medicine

## 2020-01-02 NOTE — Telephone Encounter (Signed)
Called pt referral has  been denied by two different providers resent today to different provider

## 2020-01-05 ENCOUNTER — Other Ambulatory Visit (HOSPITAL_COMMUNITY): Payer: Medicaid Other

## 2020-01-05 ENCOUNTER — Ambulatory Visit (HOSPITAL_COMMUNITY): Payer: Medicaid Other

## 2020-01-06 ENCOUNTER — Inpatient Hospital Stay (HOSPITAL_COMMUNITY): Payer: Medicaid Other

## 2020-01-06 ENCOUNTER — Other Ambulatory Visit (HOSPITAL_COMMUNITY): Payer: Medicaid Other

## 2020-01-06 ENCOUNTER — Ambulatory Visit (HOSPITAL_COMMUNITY)
Admission: RE | Admit: 2020-01-06 | Discharge: 2020-01-06 | Disposition: A | Discharge status: Home / Self Care | Payer: Medicaid Other | Source: Ambulatory Visit | Attending: Hematology | Admitting: Hematology

## 2020-01-06 ENCOUNTER — Other Ambulatory Visit: Payer: Self-pay

## 2020-01-06 DIAGNOSIS — R591 Generalized enlarged lymph nodes: Secondary | ICD-10-CM | POA: Diagnosis not present

## 2020-01-06 DIAGNOSIS — F17210 Nicotine dependence, cigarettes, uncomplicated: Secondary | ICD-10-CM | POA: Diagnosis not present

## 2020-01-06 DIAGNOSIS — G8929 Other chronic pain: Secondary | ICD-10-CM | POA: Diagnosis not present

## 2020-01-06 DIAGNOSIS — C9110 Chronic lymphocytic leukemia of B-cell type not having achieved remission: Secondary | ICD-10-CM | POA: Diagnosis not present

## 2020-01-06 DIAGNOSIS — E611 Iron deficiency: Secondary | ICD-10-CM | POA: Insufficient documentation

## 2020-01-06 DIAGNOSIS — E559 Vitamin D deficiency, unspecified: Secondary | ICD-10-CM | POA: Insufficient documentation

## 2020-01-06 DIAGNOSIS — M069 Rheumatoid arthritis, unspecified: Secondary | ICD-10-CM | POA: Diagnosis not present

## 2020-01-06 DIAGNOSIS — I10 Essential (primary) hypertension: Secondary | ICD-10-CM | POA: Diagnosis not present

## 2020-01-06 LAB — LACTATE DEHYDROGENASE: LDH: 220 U/L — ABNORMAL HIGH (ref 98–192)

## 2020-01-06 LAB — URIC ACID: Uric Acid, Serum: 5.1 mg/dL (ref 2.5–7.1)

## 2020-01-06 MED ORDER — IOHEXOL 300 MG/ML  SOLN
100.0000 mL | Freq: Once | INTRAMUSCULAR | Status: AC | PRN
  Administered 2020-01-06: 100 mL via INTRAVENOUS

## 2020-01-09 ENCOUNTER — Ambulatory Visit (HOSPITAL_COMMUNITY): Payer: Medicaid Other | Admitting: Hematology

## 2020-01-09 ENCOUNTER — Encounter (HOSPITAL_COMMUNITY): Payer: Self-pay | Admitting: Hematology

## 2020-01-09 ENCOUNTER — Other Ambulatory Visit: Payer: Self-pay

## 2020-01-09 ENCOUNTER — Inpatient Hospital Stay (HOSPITAL_BASED_OUTPATIENT_CLINIC_OR_DEPARTMENT_OTHER): Payer: Medicaid Other | Admitting: Hematology

## 2020-01-09 DIAGNOSIS — C9110 Chronic lymphocytic leukemia of B-cell type not having achieved remission: Secondary | ICD-10-CM

## 2020-01-09 NOTE — Progress Notes (Signed)
Virtual Visit via Telephone Note  I connected with [name] on 01/09/20 at  3:35 PM EDT by telephone and verified that I am speaking with the correct person using two identifiers.   I discussed the limitations, risks, security and privacy concerns of performing an evaluation and management service by telephone and the availability of in person appointments. I also discussed with the patient that there may be a patient responsible charge related to this service. The patient expressed understanding and agreed to proceed.   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   Observations/Objective: She denies any fevers, night sweats or weight loss.  Appetite and energy levels are 50%.  Reports some pain in the fingers and hands from underlying rheumatoid arthritis.  Denies any pains in the lymph node areas.  Assessment and Plan:  1.  CLL/SLL: -I reviewed results of the CT abdomen and pelvis with contrast from 01/06/2020.  Moderate enlarged periaortic, retroperitoneal, periportal, iliac, and inguinal lymph nodes consistent with history of small cell lymphocytic lymphoma.  Spleen is normal in volume.  No evidence of skeletal metastasis. -I have reviewed the indications for treatment including unexplained fevers, night sweats, weight loss, recurrent infections, painful lymphadenopathy, cytopenias among others. -I have recommended follow-up in 3 months with labs.  She was told to come back sooner should she develop any of the above symptoms.   Follow Up Instructions: RTC 3 months with labs.   I discussed the assessment and treatment plan with the patient. The patient was provided an opportunity to ask questions and all were answered. The patient agreed with the plan and demonstrated an understanding of the instructions.   The patient was advised to call back or seek an in-person evaluation if  the symptoms worsen or if the condition fails to improve as anticipated.  I provided 12 minutes of non-face-to-face time during this encounter.   Derek Jack, MD

## 2020-01-17 LAB — MISC LABCORP TEST (SEND OUT): Labcorp test code: 510340

## 2020-02-07 ENCOUNTER — Other Ambulatory Visit: Payer: Self-pay

## 2020-02-07 ENCOUNTER — Encounter: Payer: Self-pay | Admitting: Family Medicine

## 2020-02-07 ENCOUNTER — Ambulatory Visit: Payer: Medicaid Other | Admitting: Family Medicine

## 2020-02-07 ENCOUNTER — Ambulatory Visit (INDEPENDENT_AMBULATORY_CARE_PROVIDER_SITE_OTHER): Payer: Medicaid Other | Admitting: Family Medicine

## 2020-02-07 VITALS — BP 144/88 | HR 85 | Temp 97.8°F | Ht 65.0 in | Wt 151.0 lb

## 2020-02-07 DIAGNOSIS — Z124 Encounter for screening for malignant neoplasm of cervix: Secondary | ICD-10-CM | POA: Diagnosis not present

## 2020-02-07 DIAGNOSIS — Z720 Tobacco use: Secondary | ICD-10-CM

## 2020-02-07 DIAGNOSIS — E7841 Elevated Lipoprotein(a): Secondary | ICD-10-CM

## 2020-02-07 DIAGNOSIS — Z1239 Encounter for other screening for malignant neoplasm of breast: Secondary | ICD-10-CM | POA: Diagnosis not present

## 2020-02-07 DIAGNOSIS — I10 Essential (primary) hypertension: Secondary | ICD-10-CM

## 2020-02-07 DIAGNOSIS — I739 Peripheral vascular disease, unspecified: Secondary | ICD-10-CM | POA: Diagnosis not present

## 2020-02-07 DIAGNOSIS — M7989 Other specified soft tissue disorders: Secondary | ICD-10-CM

## 2020-02-07 MED ORDER — LISINOPRIL 10 MG PO TABS: 10.0000 mg | ORAL_TABLET | Freq: Every day | ORAL | 1 refills | Status: DC

## 2020-02-07 NOTE — Patient Instructions (Addendum)
I appreciate the opportunity to provide you with care for your health and wellness. Today we discussed: overall health   Follow up: 8 weeks   No Labs Referrals today GYN for pap, call back about Rheumatologist   Mammogram on June 25 th   Leg Swelling:  Prop up feet and legs when sitting up. When laying down at night to sleep use pillows to prop up feet and legs Wear compression socks 4-8 hours at a time. Do not wear to sleep.  Diet:  Increase fiber and water intake Stop bologna, hot dogs and processed foods  Eat more fruits and veggies Slow down on biscuits: you can eat them a few times a week  MED CHANGE: Lisinopril start taking 10 mg daily in the morning with breakfast.  Please continue to practice social distancing to keep you, your family, and our community safe.  If you must go out, please wear a mask and practice good handwashing.  It was a pleasure to see you and I look forward to continuing to work together on your health and well-being. Please do not hesitate to call the office if you need care or have questions about your care.  Have a wonderful day and week. With Gratitude, Cherly Beach, DNP, AGNP-BC

## 2020-02-07 NOTE — Progress Notes (Signed)
Subjective:  Patient ID: [name], female    DOB: June 07, 1961  Age: 59 y.o. MRN: [id number]  CC:  Chief Complaint  Patient presents with  . Follow-up    8 week follow up lab review wants to make sure she is up to date on all mammos colonoscopys etc       HPI  HPI [name] is a 59 year old female patient of Dr. Griffin Dakin.  Who presents today for follow-up on chronic conditions.  Previously was seen back in the early part of the spring.  Her son presented with her.  He was very concerned about her not taking his good care of herself as she could.  She lives alone up here and he lives in [place].  Today she presents with her daughter-in-law who is his wife.  She reports she is doing well and is smiling and appears to be very happy today.  She had recent labs which are detailed and reviewed today in the office.  She is scheduled to need a mammogram and that will be ordered today.  She is referred to the GYN for Pap smear.  And is following up with the rheumatologist that her insurance will cover so that she can be referred to them.  She is still smoking about 4 cigarettes a day sometimes 2 or 3.  She is open to quitting.  But however she stays alone by herself a lot so unsure if she would be able to quit sooner than later.  She has complaints of leg swelling: Does not keep her legs propped up as much when she sitting.  Additionally she has been sleeping sitting upright rather than laying down at night.  She has compression socks but has not started wearing them.  She reports that her diet has a lot of processed foods, meats, biscuits and breads.  She makes quick easy things at home as it is just her things that are easier for her to fix her heat up or make without having to cook or items that she relies on eating the most.  She drinks some water but could drink a little bit more water.  They are working on helping getting her health aide which will help with a lot of the process of things  that need to be assisted on a daily basis since her son cannot be here.  She denies having any headaches, fevers, chills, chest pain, palpitations, vision changes, hearing changes, eating trouble chewing or swallowing changes no changes in bowel or bladder habits.  Denies having any blood in urine or stool.  Today patient denies signs and symptoms of COVID 19 infection including fever, chills, cough, shortness of breath, and headache. Past Medical, Surgical, Social History, Allergies, and Medications have been Reviewed.   Past Medical History:  Diagnosis Date  . Anxiety   . Chronic abdominal pain    resolved  . Chronic lymphocytic leukemia (CLL), B-cell ([place]) 04/04/2016  . CLL (chronic lymphocytic leukemia) ([place])   . Depression with anxiety 07/07/2015  . Family history of hyperlipidemia 12/14/2019  . H. pylori infection 2/09   s/p prevpac  . HAND PAIN, BILATERAL 01/29/2009   Qualifier: Diagnosis of  By: Moshe Cipro MD, Joycelyn Schmid    . Hypertension   . Iron deficiency 11/17/2016  . Lymphadenopathy 01/17/2016  . Medical non-compliance 08/22/2017  . Nicotine addiction   . Psychosis [facility])    social worker states that patient does not like to talk about this and will  stop her meds if you try and discuss this with her.  . 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 Vitamin D, Ergocalciferol, (DRISDOL) 1.25 MG (50000 UNIT) CAPS capsule Take 1 capsule (50,000 Units total) by mouth every 7 (seven) days.    ROS:  Review of Systems  Constitutional: Negative.   HENT: Negative.   Eyes: Negative.   Respiratory: Negative for cough and shortness of breath.   Cardiovascular: Positive for leg swelling.  Gastrointestinal: Negative.   Genitourinary: Negative.   Musculoskeletal: Positive for back pain.  Skin: Negative.   Neurological: Negative.   Psychiatric/Behavioral: Negative for depression. The patient does not have insomnia.      Objective:   Today's Vitals: BP (!)  144/88 (BP Location: Left Arm, Patient Position: Sitting, Cuff Size: Normal)   Pulse 85   Temp 97.8 F (36.6 C) (Temporal)   Ht 5\' 5"  (1.651 m)   Wt 151 lb (68.5 kg)   LMP 07/25/2016   SpO2 94%   BMI 25.13 kg/m  Vitals with BMI 02/07/2020 12/19/2019 12/13/2019  Height 5\' 5"  - 5\' 5"   Weight 151 lbs 149 lbs 152 lbs  BMI 25.13 [id number]  Systolic 123456 [id number]  Diastolic 88 80 84  Pulse 85 98 82     Physical Exam Vitals and nursing note reviewed.  Constitutional:      Appearance: Normal appearance. She is well-developed, well-groomed and overweight.  HENT:     Head: Normocephalic and atraumatic.     Right Ear: External ear normal.     Left Ear: External ear normal.     Mouth/Throat:     Comments: Mask in place Eyes:     General:        Right eye: No discharge.        Left eye: No discharge.     Conjunctiva/sclera: Conjunctivae normal.  Cardiovascular:     Rate and Rhythm: Normal rate and regular rhythm.     Pulses: Normal pulses.     Heart sounds: Normal heart sounds.  Pulmonary:     Effort: Pulmonary effort is normal.     Breath sounds: Normal breath sounds.  Musculoskeletal:        General: Normal range of motion.     Right hand: Deformity present.     Left hand: Deformity present.     Cervical back: Normal range of motion and neck supple.     Right lower leg: 2+ Edema present.     Left lower leg: 2+ Edema present.     Comments: Moves all extremities, no changes in range of motion.  Discoloration changes of fingers questionable form of Raynaud's.  Lymphadenopathy:     Cervical: Cervical adenopathy present.  Skin:    General: Skin is warm.     Comments: Venous stasis skin changes noted bilateral legs  Neurological:     General: No focal deficit present.     Mental Status: She is alert and oriented to person, place, and time.     Cranial Nerves: Cranial nerves are intact.     Sensory: Sensation is intact.     Motor: Motor function is intact.     Coordination:  Coordination is intact.     Gait: Gait is intact.  Psychiatric:        Attention and Perception: Attention normal.        Mood and Affect: Mood normal.        Speech: Speech  normal.        Behavior: Behavior normal. Behavior is cooperative.        Thought Content: Thought content normal.        Cognition and Memory: Cognition normal.     Comments: Demeanor has changed from previous visit.  She is very attentive, pleasant in conversation.  She is not withdrawn she is participating.  She is happy to communicate about her condition and overall how she feels.  Smiling and making good eye contact.      Depression screen [facility] 2/9 02/07/2020 05/20/2017 09/24/2016 01/09/2015 11/29/2014  Decreased Interest 0 0 0 1 3  Down, Depressed, Hopeless 0 0 0 1 3  PHQ - 2 Score 0 0 0 2 6  Altered sleeping 0 - - 1 1  Tired, decreased energy 0 - - 1 0  Change in appetite 0 - - 1 2  Feeling bad or failure about yourself  0 - - 1 2  Trouble concentrating 0 - - 0 3  Moving slowly or fidgety/restless 0 - - 0 3  Suicidal thoughts 0 - - 0 1  PHQ-9 Score 0 - - 6 18  Difficult doing work/chores Not difficult at all - - - -       Assessment   1. PVD (peripheral vascular disease) ([place])   2. Essential hypertension   3. Encounter for screening for malignant neoplasm of cervix   4. Encounter for screening for malignant neoplasm of breast, unspecified screening modality   5. Nicotine abuse   6. Elevated lipoprotein(a)   7. Leg swelling     Tests ordered Orders Placed This Encounter  Procedures  . MM 3D SCREEN BREAST BILATERAL  . Ambulatory referral to Obstetrics / Gynecology     Plan: Please see assessment and plan per problem list above.   Meds ordered this encounter  Medications  . lisinopril (ZESTRIL) 10 MG tablet    Sig: Take 1 tablet (10 mg total) by mouth daily.    Dispense:  30 tablet    Refill:  1    Order Specific Question:   Supervising Provider    Answer:   Fayrene Helper [id number]     Patient to follow-up in 04/04/2020  35 minutes of face-to-face time was spent reviewing the above diagnoses, problem list, orders, medications.  Perlie Mayo, NP

## 2020-02-08 ENCOUNTER — Encounter: Payer: Self-pay | Admitting: Family Medicine

## 2020-02-08 DIAGNOSIS — E7841 Elevated Lipoprotein(a): Secondary | ICD-10-CM | POA: Insufficient documentation

## 2020-02-08 DIAGNOSIS — Z1239 Encounter for other screening for malignant neoplasm of breast: Secondary | ICD-10-CM | POA: Insufficient documentation

## 2020-02-08 DIAGNOSIS — Z124 Encounter for screening for malignant neoplasm of cervix: Secondary | ICD-10-CM | POA: Insufficient documentation

## 2020-02-08 DIAGNOSIS — M7989 Other specified soft tissue disorders: Secondary | ICD-10-CM | POA: Insufficient documentation

## 2020-02-08 NOTE — Assessment & Plan Note (Addendum)
Elevated cholesterol level.  Discussed diet changes, encouraged heart healthy diet and exercise.  We will repeat check level in near future and decide if medication is needed.

## 2020-02-08 NOTE — Assessment & Plan Note (Signed)
She is a light smoker at only 3-4 a day.  Asked about quitting: confirms they are currently smokes cigarettes Advise to quit smoking: Educated about QUITTING to reduce the risk of cancer, cardio and cerebrovascular disease. Assess willingness: Unwilling to quit at this time, but is working on cutting back. Assist with counseling and pharmacotherapy: Counseled for 5 minutes and literature provided. Arrange for follow up: not quitting follow up in 3 months and continue to offer help.  Would like to quit but is unable to set a date at this time.

## 2020-02-08 NOTE — Assessment & Plan Note (Signed)
Noted history of peripheral vascular disease.  Bilateral skin changes bilateral leg swelling.  Encourage use of compression socks.  And elevation.

## 2020-02-08 NOTE — Assessment & Plan Note (Signed)
Continued bilateral leg swelling with hyperpigmentation/vascular stasis changes.  Slight reduced pulses have been noted in the past.  I have encouraged leg elevation, smoking sensation, compression socks and exercise.

## 2020-03-05 ENCOUNTER — Ambulatory Visit: Payer: Self-pay | Admitting: Podiatry

## 2020-03-08 ENCOUNTER — Encounter: Payer: Self-pay | Admitting: Obstetrics & Gynecology

## 2020-03-08 ENCOUNTER — Ambulatory Visit (INDEPENDENT_AMBULATORY_CARE_PROVIDER_SITE_OTHER): Payer: Medicaid Other | Admitting: Obstetrics & Gynecology

## 2020-03-08 ENCOUNTER — Other Ambulatory Visit (HOSPITAL_COMMUNITY)
Admission: RE | Admit: 2020-03-08 | Discharge: 2020-03-08 | Disposition: A | Discharge status: Home / Self Care | Payer: Medicaid Other | Source: Ambulatory Visit | Attending: Obstetrics & Gynecology | Admitting: Obstetrics & Gynecology

## 2020-03-08 VITALS — BP 142/86 | HR 96 | Ht 65.0 in | Wt 142.0 lb

## 2020-03-08 DIAGNOSIS — Z1212 Encounter for screening for malignant neoplasm of rectum: Secondary | ICD-10-CM | POA: Diagnosis not present

## 2020-03-08 DIAGNOSIS — Z01419 Encounter for gynecological examination (general) (routine) without abnormal findings: Secondary | ICD-10-CM | POA: Diagnosis not present

## 2020-03-08 DIAGNOSIS — Z1211 Encounter for screening for malignant neoplasm of colon: Secondary | ICD-10-CM

## 2020-03-08 DIAGNOSIS — Z0000 Encounter for general adult medical examination without abnormal findings: Secondary | ICD-10-CM | POA: Insufficient documentation

## 2020-03-08 LAB — HEMOCCULT GUIAC POC 1CARD (OFFICE): Fecal Occult Blood, POC: NEGATIVE

## 2020-03-08 NOTE — Progress Notes (Signed)
Subjective:     [name] is a 59 y.o. female here for a routine exam.  Patient's last menstrual period was 07/25/2016. [id number] Birth Control Method:  menopause Menstrual Calendar(currently): amenorrheic  Current complaints: none.   Current acute medical issues:  B cell leukemia   Recent Gynecologic History Patient's last menstrual period was 07/25/2016. Last Pap: 2017,  normal Last mammogram: 2018,  normal  Past Medical History:  Diagnosis Date  . Anxiety   . Chronic abdominal pain    resolved  . Chronic lymphocytic leukemia (CLL), B-cell ([place]) 04/04/2016  . CLL (chronic lymphocytic leukemia) ([place])   . Depression with anxiety 07/07/2015  . Family history of hyperlipidemia 12/14/2019  . H. pylori infection 2/09   s/p prevpac  . HAND PAIN, BILATERAL 01/29/2009   Qualifier: Diagnosis of  By: Moshe Cipro MD, Joycelyn Schmid    . Hypertension   . Iron deficiency 11/17/2016  . Lymphadenopathy 01/17/2016  . Medical non-compliance 08/22/2017  . Nicotine addiction   . Psychosis [facility])    social worker states that patient does not like to talk about this and will stop her meds if you try and discuss this with her.  . 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 AXILLARY LYMPH NODE BIOPSY Right 03/28/2016   Procedure: RIGHT AXILLARY LYMPH NODE BIOPSY;  Surgeon: Aviva Signs, MD;  Location: [facility];  Service: General;  Laterality: Right;  . CHOLECYSTECTOMY    . COLONOSCOPY  02/07/2011   SLF: Slightly tortuous colon.  Otherwise normal colon without evidence of polyps, masses, inflammatory changes, diverticular AVMs  . HAMMER TOE SURGERY Left   . HYSTEROSCOPY WITH D & C N/A 07/30/2016   Procedure: DILATATION AND CURETTAGE /HYSTEROSCOPY;  Surgeon: Florian Buff, MD;  Location: [facility];  Service: Gynecology;  Laterality: N/A;  . Surgery of left middle finger on left hand (childhood )    . TUBAL LIGATION      OB History    Gravida  2   Para  2   Term   2   Preterm      AB      Living  2     SAB      TAB      Ectopic      Multiple      Live Births  2           Social History   Socioeconomic History  . Marital status: Single    Spouse name: Not on file  . Number of children: 2  . Years of education: Not on file  . Highest education level: Not on file  Occupational History  . Occupation: disabled  Tobacco Use  . Smoking status: Light Tobacco Smoker    Packs/day: 0.00    Years: 16.00    Pack years: 0.00    Types: Cigarettes  . Smokeless tobacco: Never Used  . Tobacco comment: 3-4 per day   Vaping Use  . Vaping Use: Never used  Substance and Sexual Activity  . Alcohol use: Not Currently    Alcohol/week: 0.0 standard drinks    Comment: occ  . Drug use: No  . Sexual activity: Not Currently    Birth control/protection: Post-menopausal  Other Topics Concern  . Not on file  Social History Narrative   Lives alone   Social Determinants of Health   Financial Resource Strain: Low Risk   . Difficulty of Paying Living Expenses: Not  hard at all  Food Insecurity: Food Insecurity Present  . Worried About Charity fundraiser in the Last Year: Sometimes true  . Ran Out of Food in the Last Year: Never true  Transportation Needs: No Transportation Needs  . Lack of Transportation (Medical): No  . Lack of Transportation (Non-Medical): No  Physical Activity: Insufficiently Active  . Days of Exercise per Week: 4 days  . Minutes of Exercise per Session: 20 min  Stress: No Stress Concern Present  . Feeling of Stress : Only a little  Social Connections: Moderately Isolated  . Frequency of Communication with Friends and Family: More than three times a week  . Frequency of Social Gatherings with Friends and Family: Once a week  . Attends Religious Services: More than 4 times per year  . Active Member of Clubs or Organizations: No  . Attends Archivist Meetings: Never  . Marital Status: Never married     Family History  Problem Relation Age of Onset  . Cancer Mother   . Hypertension Daughter      Current Outpatient Medications:  .  Acetaminophen (TYLENOL PO), Take by mouth as needed., Disp: , Rfl:  .  lisinopril (ZESTRIL) 10 MG tablet, Take 1 tablet (10 mg total) by mouth daily., Disp: 30 tablet, Rfl: 1 .  Vitamin D, Ergocalciferol, (DRISDOL) 1.25 MG (50000 UNIT) CAPS capsule, Take 1 capsule (50,000 Units total) by mouth every 7 (seven) days., Disp: 12 capsule, Rfl: 1  Review of Systems  Review of Systems  Constitutional: Negative for fever, chills, weight loss, malaise/fatigue and diaphoresis.  HENT: Negative for hearing loss, ear pain, nosebleeds, congestion, sore throat, neck pain, tinnitus and ear discharge.   Eyes: Negative for blurred vision, double vision, photophobia, pain, discharge and redness.  Respiratory: Negative for cough, hemoptysis, sputum production, shortness of breath, wheezing and stridor.   Cardiovascular: Negative for chest pain, palpitations, orthopnea, claudication, leg swelling and PND.  Gastrointestinal: negative for abdominal pain. Negative for heartburn, nausea, vomiting, diarrhea, constipation, blood in stool and melena.  Genitourinary: Negative for dysuria, urgency, frequency, hematuria and flank pain.  Musculoskeletal: Negative for myalgias, back pain, joint pain and falls.  Skin: Negative for itching and rash.  Neurological: Negative for dizziness, tingling, tremors, sensory change, speech change, focal weakness, seizures, loss of consciousness, weakness and headaches.  Endo/Heme/Allergies: Negative for environmental allergies and polydipsia. Does not bruise/bleed easily.  Psychiatric/Behavioral: Negative for depression, suicidal ideas, hallucinations, memory loss and substance abuse. The patient is not nervous/anxious and does not have insomnia.        Objective:  Blood pressure (!) 142/86, pulse 96, height 5\' 5"  (1.651 m), weight 142 lb (64.4  kg), last menstrual period 07/25/2016.   Physical Exam  Vitals reviewed. Constitutional: She is oriented to person, place, and time. She appears well-developed and well-nourished.  HENT:  Head: Normocephalic and atraumatic.        Right Ear: External ear normal.  Left Ear: External ear normal.  Nose: Nose normal.  Mouth/Throat: Oropharynx is clear and moist.  Eyes: Conjunctivae and EOM are normal. Pupils are equal, round, and reactive to light. Right eye exhibits no discharge. Left eye exhibits no discharge. No scleral icterus.  Neck: Normal range of motion. Neck supple. No tracheal deviation present. No thyromegaly present.  Cardiovascular: Normal rate, regular rhythm, normal heart sounds and intact distal pulses.  Exam reveals no gallop and no friction rub.   No murmur heard. Respiratory: Effort normal and breath sounds normal. No  respiratory distress. She has no wheezes. She has no rales. She exhibits no tenderness.  GI: Soft. Bowel sounds are normal. She exhibits no distension and no mass. There is no tenderness. There is no rebound and no guarding.  Genitourinary:  Breasts no masses skin changes or nipple changes bilaterally Large axillary lymph nodes, L>>R due to her B cell leukemia      Vulva is normal without lesions Vagina is pink moist without discharge Cervix normal in appearance and pap is done Uterus is normal size shape and contour Adnexa is negative with normal sized ovaries  {Rectal    hemoccult negative, normal tone, no masses  Musculoskeletal: Normal range of motion. She exhibits no edema and no tenderness.  Neurological: She is alert and oriented to person, place, and time. She has normal reflexes. She displays normal reflexes. No cranial nerve deficit. She exhibits normal muscle tone. Coordination normal.  Skin: Skin is warm and dry. No rash noted. No erythema. No pallor.  Psychiatric: She has a normal mood and affect. Her behavior is normal. Judgment and thought  content normal.       Medications Ordered at today's visit: No orders of the defined types were placed in this encounter.   Other orders placed at today's visit: No orders of the defined types were placed in this encounter.     Assessment:    Normal Gyn exam.    Plan:    Contraception: post menopausal status. Mammogram ordered. Follow up in: 3 years.     No follow-ups on file.

## 2020-03-08 NOTE — Addendum Note (Signed)
Addended by: Linton Rump on: 03/08/2020 03:39 PM   Modules accepted: Orders

## 2020-03-14 LAB — CYTOLOGY - PAP
Comment: NEGATIVE
Comment: NEGATIVE
HPV 16: NEGATIVE
HPV 18 / 45: NEGATIVE
High risk HPV: POSITIVE — AB

## 2020-03-16 ENCOUNTER — Other Ambulatory Visit: Payer: Self-pay | Admitting: Family Medicine

## 2020-03-16 DIAGNOSIS — I10 Essential (primary) hypertension: Secondary | ICD-10-CM

## 2020-04-02 ENCOUNTER — Other Ambulatory Visit: Payer: Self-pay

## 2020-04-02 ENCOUNTER — Inpatient Hospital Stay (HOSPITAL_COMMUNITY): Payer: Medicaid Other | Attending: Hematology

## 2020-04-02 DIAGNOSIS — C9110 Chronic lymphocytic leukemia of B-cell type not having achieved remission: Secondary | ICD-10-CM | POA: Insufficient documentation

## 2020-04-02 LAB — CBC WITH DIFFERENTIAL/PLATELET
Abs Immature Granulocytes: 0.11 10*3/uL — ABNORMAL HIGH (ref 0.00–0.07)
Basophils Absolute: 0.1 10*3/uL (ref 0.0–0.1)
Basophils Relative: 0 %
Eosinophils Absolute: 0.1 10*3/uL (ref 0.0–0.5)
Eosinophils Relative: 0 %
HCT: 33.7 % — ABNORMAL LOW (ref 36.0–46.0)
Hemoglobin: 10.9 g/dL — ABNORMAL LOW (ref 12.0–15.0)
Immature Granulocytes: 0 %
Lymphocytes Relative: 92 %
Lymphs Abs: 52.3 10*3/uL — ABNORMAL HIGH (ref 0.7–4.0)
MCH: 30.5 pg (ref 26.0–34.0)
MCHC: 32.3 g/dL (ref 30.0–36.0)
MCV: 94.4 fL (ref 80.0–100.0)
Monocytes Absolute: 0.6 10*3/uL (ref 0.1–1.0)
Monocytes Relative: 1 %
Neutro Abs: 3.7 10*3/uL (ref 1.7–7.7)
Neutrophils Relative %: 7 %
Platelets: 288 10*3/uL (ref 150–400)
RBC: 3.57 MIL/uL — ABNORMAL LOW (ref 3.87–5.11)
RDW: 16.5 % — ABNORMAL HIGH (ref 11.5–15.5)
WBC: 56.8 10*3/uL (ref 4.0–10.5)
nRBC: 0 % (ref 0.0–0.2)

## 2020-04-02 LAB — LACTATE DEHYDROGENASE: LDH: 193 U/L — ABNORMAL HIGH (ref 98–192)

## 2020-04-02 LAB — COMPREHENSIVE METABOLIC PANEL
ALT: 18 U/L (ref 0–44)
AST: 22 U/L (ref 15–41)
Albumin: 4.1 g/dL (ref 3.5–5.0)
Alkaline Phosphatase: 169 U/L — ABNORMAL HIGH (ref 38–126)
Anion gap: 10 (ref 5–15)
BUN: 22 mg/dL — ABNORMAL HIGH (ref 6–20)
CO2: 24 mmol/L (ref 22–32)
Calcium: 9.4 mg/dL (ref 8.9–10.3)
Chloride: 108 mmol/L (ref 98–111)
Creatinine, Ser: 0.73 mg/dL (ref 0.44–1.00)
GFR calc Af Amer: >60 mL/min (ref >60)
GFR calc non Af Amer: >60 mL/min (ref >60)
Glucose, Bld: 104 mg/dL — ABNORMAL HIGH (ref 70–99)
Potassium: 3.6 mmol/L (ref 3.5–5.1)
Sodium: 142 mmol/L (ref 135–145)
Total Bilirubin: 0.3 mg/dL (ref 0.3–1.2)
Total Protein: 7.5 g/dL (ref 6.5–8.1)

## 2020-04-02 LAB — URIC ACID: Uric Acid, Serum: 4.9 mg/dL (ref 2.5–7.1)

## 2020-04-02 NOTE — Progress Notes (Signed)
CRITICAL VALUE ALERT Critical value received:  WBC Date of notification:  04/02/20 Time of notification: 7837 Critical value read back:  YES Nurse who received alert:  TWJackson RN MD notified time and response:  5423 No orders

## 2020-04-04 ENCOUNTER — Ambulatory Visit: Payer: Medicaid Other | Admitting: Family Medicine

## 2020-04-09 ENCOUNTER — Ambulatory Visit (HOSPITAL_COMMUNITY): Payer: Medicaid Other | Admitting: Hematology

## 2020-04-30 ENCOUNTER — Ambulatory Visit: Payer: Self-pay | Admitting: Podiatry

## 2022-02-14 ENCOUNTER — Emergency Department (HOSPITAL_COMMUNITY): Payer: Medicaid Other

## 2022-02-14 ENCOUNTER — Inpatient Hospital Stay (HOSPITAL_COMMUNITY)
Admission: EM | Admit: 2022-02-14 | Discharge: 2022-04-15 | DRG: 853 | Disposition: E | Payer: Medicaid Other | Attending: Internal Medicine | Admitting: Internal Medicine

## 2022-02-14 DIAGNOSIS — G9341 Metabolic encephalopathy: Secondary | ICD-10-CM | POA: Diagnosis not present

## 2022-02-14 DIAGNOSIS — E1151 Type 2 diabetes mellitus with diabetic peripheral angiopathy without gangrene: Secondary | ICD-10-CM | POA: Diagnosis not present

## 2022-02-14 DIAGNOSIS — L8990 Pressure ulcer of unspecified site, unspecified stage: Secondary | ICD-10-CM | POA: Insufficient documentation

## 2022-02-14 DIAGNOSIS — Z79899 Other long term (current) drug therapy: Secondary | ICD-10-CM

## 2022-02-14 DIAGNOSIS — R579 Shock, unspecified: Secondary | ICD-10-CM

## 2022-02-14 DIAGNOSIS — K861 Other chronic pancreatitis: Secondary | ICD-10-CM | POA: Diagnosis present

## 2022-02-14 DIAGNOSIS — E1165 Type 2 diabetes mellitus with hyperglycemia: Secondary | ICD-10-CM | POA: Diagnosis not present

## 2022-02-14 DIAGNOSIS — E11649 Type 2 diabetes mellitus with hypoglycemia without coma: Secondary | ICD-10-CM | POA: Diagnosis not present

## 2022-02-14 DIAGNOSIS — Z809 Family history of malignant neoplasm, unspecified: Secondary | ICD-10-CM

## 2022-02-14 DIAGNOSIS — Z515 Encounter for palliative care: Secondary | ICD-10-CM

## 2022-02-14 DIAGNOSIS — F419 Anxiety disorder, unspecified: Secondary | ICD-10-CM | POA: Diagnosis not present

## 2022-02-14 DIAGNOSIS — Z66 Do not resuscitate: Secondary | ICD-10-CM | POA: Diagnosis present

## 2022-02-14 DIAGNOSIS — E43 Unspecified severe protein-calorie malnutrition: Secondary | ICD-10-CM | POA: Insufficient documentation

## 2022-02-14 DIAGNOSIS — I959 Hypotension, unspecified: Secondary | ICD-10-CM

## 2022-02-14 DIAGNOSIS — J9811 Atelectasis: Secondary | ICD-10-CM | POA: Diagnosis present

## 2022-02-14 DIAGNOSIS — K529 Noninfective gastroenteritis and colitis, unspecified: Secondary | ICD-10-CM | POA: Diagnosis present

## 2022-02-14 DIAGNOSIS — C8300 Small cell B-cell lymphoma, unspecified site: Secondary | ICD-10-CM

## 2022-02-14 DIAGNOSIS — F32A Depression, unspecified: Secondary | ICD-10-CM | POA: Diagnosis present

## 2022-02-14 DIAGNOSIS — J90 Pleural effusion, not elsewhere classified: Secondary | ICD-10-CM | POA: Diagnosis not present

## 2022-02-14 DIAGNOSIS — E870 Hyperosmolality and hypernatremia: Secondary | ICD-10-CM | POA: Diagnosis not present

## 2022-02-14 DIAGNOSIS — I5032 Chronic diastolic (congestive) heart failure: Secondary | ICD-10-CM | POA: Diagnosis present

## 2022-02-14 DIAGNOSIS — I110 Hypertensive heart disease with heart failure: Secondary | ICD-10-CM | POA: Diagnosis present

## 2022-02-14 DIAGNOSIS — J9602 Acute respiratory failure with hypercapnia: Secondary | ICD-10-CM | POA: Diagnosis not present

## 2022-02-14 DIAGNOSIS — E875 Hyperkalemia: Secondary | ICD-10-CM

## 2022-02-14 DIAGNOSIS — R042 Hemoptysis: Secondary | ICD-10-CM | POA: Diagnosis not present

## 2022-02-14 DIAGNOSIS — T380X5A Adverse effect of glucocorticoids and synthetic analogues, initial encounter: Secondary | ICD-10-CM | POA: Diagnosis not present

## 2022-02-14 DIAGNOSIS — C9110 Chronic lymphocytic leukemia of B-cell type not having achieved remission: Secondary | ICD-10-CM

## 2022-02-14 DIAGNOSIS — J9601 Acute respiratory failure with hypoxia: Secondary | ICD-10-CM | POA: Diagnosis present

## 2022-02-14 DIAGNOSIS — I10 Essential (primary) hypertension: Secondary | ICD-10-CM | POA: Diagnosis present

## 2022-02-14 DIAGNOSIS — Z7189 Other specified counseling: Secondary | ICD-10-CM | POA: Diagnosis not present

## 2022-02-14 DIAGNOSIS — K5900 Constipation, unspecified: Secondary | ICD-10-CM | POA: Diagnosis present

## 2022-02-14 DIAGNOSIS — E119 Type 2 diabetes mellitus without complications: Secondary | ICD-10-CM

## 2022-02-14 DIAGNOSIS — R188 Other ascites: Secondary | ICD-10-CM | POA: Diagnosis not present

## 2022-02-14 DIAGNOSIS — Z681 Body mass index (BMI) 19 or less, adult: Secondary | ICD-10-CM | POA: Diagnosis not present

## 2022-02-14 DIAGNOSIS — E871 Hypo-osmolality and hyponatremia: Secondary | ICD-10-CM

## 2022-02-14 DIAGNOSIS — A419 Sepsis, unspecified organism: Principal | ICD-10-CM

## 2022-02-14 DIAGNOSIS — Z4682 Encounter for fitting and adjustment of non-vascular catheter: Secondary | ICD-10-CM | POA: Diagnosis not present

## 2022-02-14 DIAGNOSIS — R0609 Other forms of dyspnea: Secondary | ICD-10-CM | POA: Diagnosis not present

## 2022-02-14 DIAGNOSIS — R0602 Shortness of breath: Secondary | ICD-10-CM | POA: Diagnosis not present

## 2022-02-14 DIAGNOSIS — D75839 Thrombocytosis, unspecified: Secondary | ICD-10-CM | POA: Diagnosis not present

## 2022-02-14 DIAGNOSIS — E878 Other disorders of electrolyte and fluid balance, not elsewhere classified: Secondary | ICD-10-CM | POA: Diagnosis not present

## 2022-02-14 DIAGNOSIS — Y929 Unspecified place or not applicable: Secondary | ICD-10-CM

## 2022-02-14 DIAGNOSIS — R64 Cachexia: Secondary | ICD-10-CM | POA: Diagnosis present

## 2022-02-14 DIAGNOSIS — G893 Neoplasm related pain (acute) (chronic): Secondary | ICD-10-CM | POA: Diagnosis not present

## 2022-02-14 DIAGNOSIS — D72829 Elevated white blood cell count, unspecified: Secondary | ICD-10-CM

## 2022-02-14 DIAGNOSIS — J940 Chylous effusion: Secondary | ICD-10-CM | POA: Diagnosis not present

## 2022-02-14 DIAGNOSIS — R6521 Severe sepsis with septic shock: Secondary | ICD-10-CM | POA: Diagnosis present

## 2022-02-14 DIAGNOSIS — R54 Age-related physical debility: Secondary | ICD-10-CM | POA: Diagnosis present

## 2022-02-14 DIAGNOSIS — R627 Adult failure to thrive: Secondary | ICD-10-CM | POA: Diagnosis present

## 2022-02-14 DIAGNOSIS — F17200 Nicotine dependence, unspecified, uncomplicated: Secondary | ICD-10-CM | POA: Diagnosis not present

## 2022-02-14 DIAGNOSIS — F17210 Nicotine dependence, cigarettes, uncomplicated: Secondary | ICD-10-CM | POA: Diagnosis present

## 2022-02-14 DIAGNOSIS — Z888 Allergy status to other drugs, medicaments and biological substances status: Secondary | ICD-10-CM

## 2022-02-14 DIAGNOSIS — W19XXXA Unspecified fall, initial encounter: Secondary | ICD-10-CM | POA: Diagnosis not present

## 2022-02-14 DIAGNOSIS — Z8249 Family history of ischemic heart disease and other diseases of the circulatory system: Secondary | ICD-10-CM

## 2022-02-14 DIAGNOSIS — E785 Hyperlipidemia, unspecified: Secondary | ICD-10-CM | POA: Diagnosis present

## 2022-02-14 DIAGNOSIS — L89151 Pressure ulcer of sacral region, stage 1: Secondary | ICD-10-CM | POA: Diagnosis present

## 2022-02-14 DIAGNOSIS — E876 Hypokalemia: Secondary | ICD-10-CM

## 2022-02-14 LAB — COMPREHENSIVE METABOLIC PANEL
ALT: 27 U/L (ref 0–44)
AST: 43 U/L — ABNORMAL HIGH (ref 15–41)
Albumin: 4.1 g/dL (ref 3.5–5.0)
Alkaline Phosphatase: 135 U/L — ABNORMAL HIGH (ref 38–126)
Anion gap: 8 (ref 5–15)
BUN: 24 mg/dL — ABNORMAL HIGH (ref 8–23)
CO2: 28 mmol/L (ref 22–32)
Calcium: 9.1 mg/dL (ref 8.9–10.3)
Chloride: 108 mmol/L (ref 98–111)
Creatinine, Ser: 0.87 mg/dL (ref 0.44–1.00)
GFR, Estimated: >60 mL/min (ref >60)
Glucose, Bld: 198 mg/dL — ABNORMAL HIGH (ref 70–99)
Potassium: 4.5 mmol/L (ref 3.5–5.1)
Sodium: 144 mmol/L (ref 135–145)
Total Bilirubin: 0.5 mg/dL (ref 0.3–1.2)
Total Protein: 7.2 g/dL (ref 6.5–8.1)

## 2022-02-14 LAB — CBC WITH DIFFERENTIAL/PLATELET
Abs Immature Granulocytes: 0.19 10*3/uL — ABNORMAL HIGH (ref 0.00–0.07)
Basophils Absolute: 0 10*3/uL (ref 0.0–0.1)
Basophils Relative: 0 %
Eosinophils Absolute: 0 10*3/uL (ref 0.0–0.5)
Eosinophils Relative: 0 %
HCT: 44.3 % (ref 36.0–46.0)
Hemoglobin: 12.9 g/dL (ref 12.0–15.0)
Immature Granulocytes: 0 %
Lymphocytes Relative: 94 %
Lymphs Abs: 98.5 10*3/uL — ABNORMAL HIGH (ref 0.7–4.0)
MCH: 28 pg (ref 26.0–34.0)
MCHC: 29.1 g/dL — ABNORMAL LOW (ref 30.0–36.0)
MCV: 96.3 fL (ref 80.0–100.0)
Monocytes Absolute: 0.8 10*3/uL (ref 0.1–1.0)
Monocytes Relative: 1 %
Neutro Abs: 4.9 10*3/uL (ref 1.7–7.7)
Neutrophils Relative %: 5 %
Platelets: 306 10*3/uL (ref 150–400)
RBC: 4.6 MIL/uL (ref 3.87–5.11)
RDW: 19.8 % — ABNORMAL HIGH (ref 11.5–15.5)
WBC: 104.4 10*3/uL (ref 4.0–10.5)
nRBC: 0 % (ref 0.0–0.2)

## 2022-02-14 LAB — BLOOD GAS, ARTERIAL
Acid-Base Excess: 3.5 mmol/L — ABNORMAL HIGH (ref 0.0–2.0)
Bicarbonate: 27.8 mmol/L (ref 20.0–28.0)
Drawn by: 22223
FIO2: 100 %
O2 Saturation: 100 %
Patient temperature: 38.1
pCO2 arterial: 42 mmHg (ref 32–48)
pH, Arterial: 7.43 (ref 7.35–7.45)
pO2, Arterial: 244 mmHg — ABNORMAL HIGH (ref 83–108)

## 2022-02-14 LAB — URIC ACID: Uric Acid, Serum: 6.7 mg/dL (ref 2.5–7.1)

## 2022-02-14 LAB — URINALYSIS, ROUTINE W REFLEX MICROSCOPIC
Bacteria, UA: NONE SEEN
Bilirubin Urine: NEGATIVE
Glucose, UA: 50 mg/dL — AB
Ketones, ur: NEGATIVE mg/dL
Leukocytes,Ua: NEGATIVE
Nitrite: NEGATIVE
Protein, ur: 100 mg/dL — AB
Specific Gravity, Urine: 1.015 (ref 1.005–1.030)
pH: 6 (ref 5.0–8.0)

## 2022-02-14 LAB — RAPID URINE DRUG SCREEN, HOSP PERFORMED
Amphetamines: NOT DETECTED
Barbiturates: NOT DETECTED
Benzodiazepines: NOT DETECTED
Cocaine: NOT DETECTED
Opiates: NOT DETECTED
Tetrahydrocannabinol: NOT DETECTED

## 2022-02-14 LAB — LACTIC ACID, PLASMA
Lactic Acid, Venous: 4.4 mmol/L (ref 0.5–1.9)
Lactic Acid, Venous: 1.8 mmol/L (ref 0.5–1.9)

## 2022-02-14 LAB — CBG MONITORING, ED: Glucose-Capillary: 200 mg/dL — ABNORMAL HIGH (ref 70–99)

## 2022-02-14 LAB — LACTATE DEHYDROGENASE: LDH: 250 U/L — ABNORMAL HIGH (ref 98–192)

## 2022-02-14 LAB — TROPONIN I (HIGH SENSITIVITY)
Troponin I (High Sensitivity): 109 ng/L (ref <18)
Troponin I (High Sensitivity): 112 ng/L (ref <18)

## 2022-02-14 LAB — ETHANOL: Alcohol, Ethyl (B): <10 mg/dL (ref <10)

## 2022-02-14 LAB — BRAIN NATRIURETIC PEPTIDE: B Natriuretic Peptide: 534 pg/mL — ABNORMAL HIGH (ref 0.0–100.0)

## 2022-02-14 MED ORDER — ROCURONIUM BROMIDE 10 MG/ML (PF) SYRINGE
PREFILLED_SYRINGE | INTRAVENOUS | Status: AC
  Administered 2022-02-14: 60 mg
  Filled 2022-02-14: qty 10

## 2022-02-14 MED ORDER — LACTATED RINGERS IV BOLUS
1000.0000 mL | Freq: Once | INTRAVENOUS | Status: AC
  Administered 2022-02-14: 1000 mL via INTRAVENOUS

## 2022-02-14 MED ORDER — SODIUM CHLORIDE 0.9 % IV SOLN
2.0000 g | Freq: Three times a day (TID) | INTRAVENOUS | Status: DC
  Administered 2022-02-15 – 2022-02-16 (×4): 2 g via INTRAVENOUS
  Filled 2022-02-14 (×4): qty 12.5

## 2022-02-14 MED ORDER — MIDAZOLAM HCL 2 MG/2ML IJ SOLN
2.0000 mg | INTRAMUSCULAR | Status: DC | PRN
  Administered 2022-02-14 – 2022-02-15 (×2): 2 mg via INTRAVENOUS
  Filled 2022-02-14 (×2): qty 2

## 2022-02-14 MED ORDER — MIDAZOLAM HCL 2 MG/2ML IJ SOLN
2.0000 mg | INTRAMUSCULAR | Status: DC | PRN
Start: 2022-02-14 — End: 2022-02-17
  Administered 2022-02-15 (×2): 2 mg via INTRAVENOUS
  Filled 2022-02-14 (×2): qty 2

## 2022-02-14 MED ORDER — FENTANYL CITRATE PF 50 MCG/ML IJ SOSY: 50.0000 ug | PREFILLED_SYRINGE | INTRAMUSCULAR | Status: DC | PRN

## 2022-02-14 MED ORDER — LACTATED RINGERS IV SOLN: INTRAVENOUS | Status: DC

## 2022-02-14 MED ORDER — SODIUM CHLORIDE 0.9 % IV SOLN
2.0000 g | Freq: Once | INTRAVENOUS | Status: AC
  Administered 2022-02-14: 2 g via INTRAVENOUS
  Filled 2022-02-14: qty 12.5

## 2022-02-14 MED ORDER — VANCOMYCIN HCL IN DEXTROSE 1-5 GM/200ML-% IV SOLN
1000.0000 mg | Freq: Once | INTRAVENOUS | Status: DC
  Administered 2022-02-14: 1000 mg via INTRAVENOUS
  Filled 2022-02-14: qty 200

## 2022-02-14 MED ORDER — FENTANYL CITRATE PF 50 MCG/ML IJ SOSY
PREFILLED_SYRINGE | INTRAMUSCULAR | Status: AC
  Filled 2022-02-14: qty 2

## 2022-02-14 MED ORDER — FENTANYL CITRATE PF 50 MCG/ML IJ SOSY
50.0000 ug | PREFILLED_SYRINGE | INTRAMUSCULAR | Status: DC | PRN
  Administered 2022-02-14 (×2): 50 ug via INTRAVENOUS
  Filled 2022-02-14 (×2): qty 1

## 2022-02-14 MED ORDER — METRONIDAZOLE 500 MG/100ML IV SOLN
500.0000 mg | Freq: Once | INTRAVENOUS | Status: AC
  Administered 2022-02-14: 500 mg via INTRAVENOUS
  Filled 2022-02-14: qty 100

## 2022-02-14 MED ORDER — KETAMINE HCL 50 MG/5ML IJ SOSY
PREFILLED_SYRINGE | INTRAMUSCULAR | Status: AC
  Filled 2022-02-14: qty 5

## 2022-02-14 MED ORDER — IOHEXOL 350 MG/ML SOLN
100.0000 mL | Freq: Once | INTRAVENOUS | Status: AC | PRN
Start: 2022-02-14 — End: 2022-02-14
  Administered 2022-02-14: 100 mL via INTRAVENOUS

## 2022-02-14 MED ORDER — ETOMIDATE 2 MG/ML IV SOLN
INTRAVENOUS | Status: AC
  Administered 2022-02-14: 20 mg
  Filled 2022-02-14: qty 20

## 2022-02-14 MED ORDER — VANCOMYCIN HCL 1250 MG/250ML IV SOLN: 1250.0000 mg | INTRAVENOUS | Status: DC

## 2022-02-14 MED ORDER — SUCCINYLCHOLINE CHLORIDE 200 MG/10ML IV SOSY
PREFILLED_SYRINGE | INTRAVENOUS | Status: AC
  Filled 2022-02-14: qty 10

## 2022-02-14 MED ORDER — MIDAZOLAM HCL 2 MG/2ML IJ SOLN
2.0000 mg | Freq: Once | INTRAMUSCULAR | Status: DC
  Filled 2022-02-14: qty 2

## 2022-02-14 MED ORDER — MIDAZOLAM HCL 2 MG/2ML IJ SOLN
INTRAMUSCULAR | Status: AC
  Filled 2022-02-14: qty 2

## 2022-02-14 MED ORDER — NOREPINEPHRINE 4 MG/250ML-% IV SOLN
0.0000 ug/min | INTRAVENOUS | Status: DC
  Administered 2022-02-14: 3 ug/min via INTRAVENOUS
  Filled 2022-02-14: qty 250

## 2022-02-14 NOTE — ED Triage Notes (Signed)
Pt to ED via [facility]; called out initially for abdominal pain. Alert and talking on EMS arrival. Pt became unresponsive with EMS, placed on NRB, agonal breathing on arrival to ED. EDP assessed on arrival and at bedside to intubate.

## 2022-02-14 NOTE — ED Notes (Signed)
Date and time results received: 02/21/2022 1908   Test: lactic Critical Value: 4.4  Name of Provider Notified: Alvino Chapel, MD

## 2022-02-14 NOTE — Progress Notes (Signed)
Pharmacy Antibiotic Note  [name] is a 61 y.o. female admitted on 02/18/2022 to begin empiric antibiotics for sepsis coverage, unknown source. Pharmacy has been consulted for Cefepime and Vancomycin dosing x 7 days.   Hx CLL in remission.  Intubated in ED.    Cefepime 2gm IV, Vancomycin 1 gm IV and Metronidazole 500 mg IV x 1 infusing now.  Plan: Cefepime 2gm IV q8h x 7 days Vancomycin 1250 mg IV Q 24 hrs x 6 days to begin 02/15/22. Goal AUC 400-550. Expected AUC: 492 SCr used: 0.87 Follow renal function, culture data, clinical progress and antibiotic plans.  Height: '5\' 5"'$  (165.1 cm) Weight: 64 kg (141 lb 1.5 oz) IBW/kg (Calculated) : 57  Temp (24hrs), Avg:98.5 F (36.9 C), Min:97.3 F (36.3 C), Max:99.7 F (37.6 C)  Recent Labs  Lab 02/23/2022 1842  WBC 104.4*  CREATININE 0.87  LATICACIDVEN 4.4*    Estimated Creatinine Clearance: 61.1 mL/min (by C-G formula based on SCr of 0.87 mg/dL).    Allergies  Allergen Reactions   Feraheme [Ferumoxytol] Shortness Of Breath and Swelling    Antimicrobials this admission:  Cefepime 6/2 pm >> (6/9 am)  Vancomycin 6/2 >> (6/8)  Metronidazole 500 mg IV x 1 in ED  Dose adjustments this admission:  n/a  Microbiology results:  6/2 blood: sent  Thank you for allowing pharmacy to be a part of this patient's care.  Arty Baumgartner, [place] 02/18/2022 8:03 PM

## 2022-02-14 NOTE — H&P (Incomplete)
NAME:  [name], MRN:  [id number], DOB:  January 04, 1961, LOS: 0 ADMISSION DATE:  03/06/2022, CONSULTATION DATE:  6/2 REFERRING MD:  Dr. Alvino Chapel, CHIEF COMPLAINT:  Unresponsive; Acute resp failure   History of Present Illness:  [name] is a 61 year old female with pertinent PMH of CLL, depression, anxiety, HLD, HTN presents to [facility] ED on 6/2 unresponsive.  On 6/2 [name] called EMS for abdominal pain.  [name] initially was alert and talking but became unresponsive with AMS.  [name] was hypoxic with agonal breathing and was placed on NRB.  [name] was noted to be hypotensive.  [name] transported to [facility] ED.  Upon arrival to [facility] ED [name] remained unresponsive with agonal respirations.  [name] intubated for airway protection and hypoxia.  BP initially 81/65.  Given IV fluids.  Started on levo.  Fever 100.9 F and WBC 104.4.  Started on cefepime, Flagyl, Vanco.  Cultures pending.  Glucose 198, BNP 534, troponin 109 then 112, LA 4.4 then 1.8, UA unremarkable, UDS negative.  CXR with large left pleural effusion and small right pleural effusion.  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  CT abdomen with ascending colitis constipation, small intestine wall thickening possibly related to gastroenteritis; small volume ascites; extensive abdominopelvic and inguinal adenopathy.  PCCM consulted for ICU admission and transfer to [facility].  Pertinent  Medical History   Past Medical History:  Diagnosis Date   Anxiety    Chronic abdominal pain    resolved   Chronic lymphocytic leukemia (CLL), B-cell ([place]) 04/04/2016   CLL (chronic lymphocytic leukemia) ([place])    Depression with anxiety 07/07/2015   Family history of hyperlipidemia 12/14/2019   H. pylori infection 2/09   s/p prevpac   HAND PAIN, BILATERAL 01/29/2009   Qualifier: Diagnosis of  By: Moshe Cipro MD, [place]     Hypertension    Iron deficiency 11/17/2016    Lymphadenopathy 01/17/2016   Medical non-compliance 08/22/2017   Nicotine addiction    Psychosis ([place])    social worker states that [name] does not like to talk about this and will stop her meds if you try and discuss this with her.   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  Interim History / Subjective:  [name] intubated on [facility] [name] recently received prn fentanyl but arousable and able to MAE on command; PERRL On 8 mcg levo  Objective   Blood pressure (!) 85/70, pulse 86, temperature (!) 100.9 F (38.3 C), temperature source Bladder, resp. rate 18, height '5\' 5"'$  (1.651 m), weight 64 kg, last menstrual period 07/25/2016, SpO2 100 %.    Vent Mode: PRVC FiO2 (%):  [100 %] 100 % Set Rate:  [18 bmp] 18 bmp Vt Set:  [460 mL] 460 mL PEEP:  [5 cmH20] 5 cmH20 Plateau Pressure:  [22 cmH20] 22 cmH20   Intake/Output Summary (Last 24 hours) at 03/04/2022 2224 Last data filed at 02/23/2022 2152 Gross per 24 hour  Intake 2800.65 ml  Output --  Net 2800.65 ml   Filed Weights   03/14/2022 1840  Weight: 64 kg    Examination: General:  critically ill appearing on mech vent HEENT: MM pink/moist; ETT in place Neuro: Alert and able to  follow commands; MAE; PERRL CV: s1s2, no m/r/g PULM:  dim rhonchi BS bilaterally; on mech vent PRVC GI: soft, bsx4 active  Extremities: warm/dry, no edema; venous stasis BLE; b/l hard inguinal lymphadenopathy Skin: no rashes or lesions appreciated   Resolved Hospital Problem list     Assessment & Plan:   Acute respiratory failure with hypoxia B/L pleural effusion L>R P: -LTVV strategy with tidal volumes of 6-8 cc/kg ideal body weight -Goal plateau pressures less than 30 and driving pressures less than 15 -Wean PEEP/FiO2 for SpO2  >92% -VAP bundle in place -Daily SAT and SBT -PAD protocol in place -wean sedation for RASS goal 0 -will give trial of IV Lasix -CXR in a.m. 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 Delton Coombes outpatient and was last seen 12/2019; ED physician spoke with Dr. Delton Coombes who believes this is related to her CLL and not acute episode; does not appear to have tumor lysis syndrome P: -oncology consulted by [facility] ED physician -trend bmp, phosph, uric acid -Trend WBC/fever curve -Continue broad-spectrum antibiotics; follow cultures  Hyperglycemia P: -Check A1c -SSI and CBG monitoring  Abdominal Pain: ?Gastroenteritis vs. Constipation? P: -check lipase -bowel regimen -supportive care  HTN HLD P: -hold home lisinopril in setting of hypotension -no home statin listed  Depression/anxiety P: -no home meds listed   Best Practice (right click and "Reselect all SmartList Selections" daily)   Diet/type: NPO w/ meds via tube DVT prophylaxis: LMWH GI prophylaxis: PPI Lines: N/A Foley:  N/A Code Status:  DNR Last date of multidisciplinary goals of care discussion [6/3 spoke with son Harrell Gave over phone and updated. He states that she is an active DNR and has told him that she would not  want aggressive measures done. He's okay with keeping her intubated for now until we can safely extubate. He does not want compressions or shock. DNR placed in chart.]  Labs   CBC: Recent Labs  Lab 03/11/2022 1842  WBC 104.4*  NEUTROABS 4.9  HGB 12.9  HCT 44.3  MCV 96.3  PLT 245    Basic Metabolic Panel: Recent Labs  Lab 02/23/2022 1842  NA 144  K 4.5  CL 108  CO2 28  GLUCOSE 198*  BUN 24*  CREATININE 0.87  CALCIUM 9.1   GFR: Estimated Creatinine Clearance: 61.1 mL/min (by C-G formula based on SCr of 0.87 mg/dL). Recent Labs  Lab 02/23/2022 1842 02/27/2022 1942  WBC 104.4*  --   LATICACIDVEN 4.4* 1.8    Liver Function Tests: Recent Labs  Lab 03/09/2022 1842  AST 43*  ALT 27  ALKPHOS 135*  BILITOT 0.5  PROT 7.2  ALBUMIN 4.1   No results for input(s): LIPASE, AMYLASE in the last 168 hours. No results for input(s): AMMONIA in the last 168 hours.  ABG No results found for: PHART, PCO2ART, PO2ART, HCO3, TCO2, ACIDBASEDEF, O2SAT   Coagulation  Profile: No results for input(s): INR, PROTIME in the last 168 hours.  Cardiac Enzymes: No results for input(s): CKTOTAL, CKMB, CKMBINDEX, TROPONINI in the last 168 hours.  HbA1C: Hgb A1c MFr Bld  Date/Time Value Ref Range Status  12/13/2019 11:51 AM 5.9 (H) <5.7 % of total Hgb Final    Comment:    For someone without known diabetes, a hemoglobin  A1c value between 5.7% and 6.4% is consistent with prediabetes and should be confirmed with a  follow-up test. . For someone with known diabetes, a value <7% indicates that their diabetes is well controlled. A1c targets should be individualized based on duration of diabetes, age, comorbid conditions, and other considerations. . This assay result is consistent with an increased risk of diabetes. . Currently, no consensus exists regarding use of hemoglobin A1c for diagnosis of diabetes for children. .     CBG: Recent Labs  Lab 02/28/2022 1826  GLUCAP 200*     Review of Systems:   [name] is encephalopathic and/or intubated. Therefore history has been obtained from chart review.    Past Medical History:  She,  has a past medical history of Anxiety, Chronic abdominal pain, Chronic lymphocytic leukemia (CLL), B-cell ([place]) (04/04/2016), CLL (chronic lymphocytic leukemia) ([place]), Depression with anxiety (07/07/2015), Family history of hyperlipidemia (12/14/2019), H. pylori infection (2/09), HAND PAIN, BILATERAL (01/29/2009), Hypertension, Iron deficiency (11/17/2016), Lymphadenopathy (01/17/2016), Medical non-compliance (08/22/2017), Nicotine addiction, Psychosis ([place]), and Western blot positive HSV2 (03/30/2012).   Surgical History:   Past Surgical History:  Procedure Laterality Date   AXILLARY LYMPH NODE BIOPSY Right 03/28/2016   Procedure: RIGHT AXILLARY LYMPH NODE BIOPSY;  Surgeon: Aviva Signs, MD;  Location: [facility];  Service: General;  Laterality: Right;   CHOLECYSTECTOMY     COLONOSCOPY  02/07/2011   SLF: Slightly tortuous colon.  Otherwise normal colon without evidence of polyps, masses, inflammatory changes, diverticular AVMs   HAMMER TOE SURGERY Left    HYSTEROSCOPY WITH D & C N/A 07/30/2016   Procedure: DILATATION AND CURETTAGE /HYSTEROSCOPY;  Surgeon: Florian Buff, MD;  Location: [facility];  Service: Gynecology;  Laterality: N/A;   Surgery of left middle finger on left hand (childhood )     TUBAL LIGATION       Social History:   reports that she has been smoking cigarettes. She has never used smokeless tobacco. She reports that she does not currently use alcohol. She reports that she does not use drugs.   Family History:  Her family history includes Cancer in her mother; Hypertension in her daughter.   Allergies Allergies  Allergen Reactions   Feraheme [Ferumoxytol] Shortness Of Breath and Swelling     Home Medications  Prior to Admission medications   Medication Sig Start Date End Date Taking? Authorizing Provider  Acetaminophen  (TYLENOL PO) Take by mouth as needed.    [provider]  lisinopril (ZESTRIL) 10 MG tablet TAKE 1 TABLET BY MOUTH ONCE A DAY. 03/20/20   Fayrene Helper, MD  Vitamin D, Ergocalciferol, (DRISDOL) 1.25 MG (50000 UNIT) CAPS capsule Take 1 capsule (50,000 Units total) by mouth every 7 (seven) days. 12/15/19   Perlie Mayo, NP  fluticasone (FLONASE) 50 MCG/ACT nasal spray Place 1 spray into the nose daily. 07/02/11 11/27/11  Fayrene Helper, MD     Critical care time: 45 minutes    JD Geryl Rankins [facility] 03/06/2022, 10:24 PM  Please see [email or web address] for pager details.  From 7A-7P if no response, please  call [phone]. After hours, please call ELink [phone].

## 2022-02-14 NOTE — ED Notes (Signed)
Date and time results received: 02/13/2022 1916   Test: WBC Critical Value: 104.4  Name of Provider Notified: Alvino Chapel, MD

## 2022-02-14 NOTE — ED Provider Notes (Signed)
[facility] Provider Note   CSN: [id number] Arrival date & time: 02/27/2022  2706     History  Chief Complaint  Patient presents with   unresponsive    [name] is a 61 y.o. female.  HPI Patient presents with unresponsiveness.  Reportedly EMS was called for abdominal pain.  Has been complaining of abdominal pain.  However on ambulance became unresponsive.  Became more hypotensive and hypoxic.  Reported to have sats between 30 and 80%.  History of CLL but apparently does not see the doctor.  Initial blood pressure reassuring and CBG of 200 but hypoxic with decreased mental status and really no gag reflex.  Pupils somewhat constricted initially.   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 pylori infection 2/09   s/p prevpac   HAND PAIN, BILATERAL 01/29/2009   Qualifier: Diagnosis of  By: Moshe Cipro MD, Margaret     Hypertension    Iron deficiency 11/17/2016   Lymphadenopathy 01/17/2016   Medical non-compliance 08/22/2017   Nicotine addiction    Psychosis ([place])    social worker states that patient does not like to talk about this and will stop her meds if you try and discuss this with her.   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 Authorizing Provider  Acetaminophen (TYLENOL PO) Take by mouth as needed.    [provider]  lisinopril (ZESTRIL) 10 MG tablet TAKE 1 TABLET BY MOUTH ONCE A DAY. 03/20/20   Fayrene Helper, MD  Vitamin D, Ergocalciferol, (DRISDOL) 1.25 MG (50000 UNIT) CAPS capsule Take 1 capsule (50,000 Units total) by mouth every 7 (seven) days. 12/15/19   Perlie Mayo, NP  fluticasone (FLONASE) 50 MCG/ACT nasal  spray Place 1 spray into the nose daily. 07/02/11 11/27/11  Fayrene Helper, MD      Allergies    Feraheme [ferumoxytol]    Review of Systems   Review of Systems  Physical Exam Updated Vital Signs BP (!) 89/70   Pulse 87   Temp (!) 100.6 F (38.1 C) (Bladder)   Resp 18   Ht '5\' 5"'$  (1.651 m)   Wt 64 kg   LMP 07/25/2016   SpO2 100%   BMI 23.48 kg/m  Physical Exam Vitals reviewed.  Cardiovascular:     Rate and Rhythm: Normal rate.  Pulmonary:     Comments: Decreased breath sounds. Skin:    Comments: Chronic venous changes bilateral lower extremities.  Some distention of abdomen.  Neurological:     Comments: Breathing spontaneously but pretty much unresponsive.  No gag reflex.  No threat reflex.    ED Results / Procedures / Treatments   Labs (all labs ordered are listed, but only abnormal results are displayed) Labs Reviewed  COMPREHENSIVE METABOLIC PANEL - Abnormal; Notable for the following components:      Result Value   Glucose, Bld 198 (*)    BUN 24 (*)    AST 43 (*)    Alkaline Phosphatase 135 (*)    All other components within normal limits  CBC WITH DIFFERENTIAL/PLATELET - Abnormal; Notable for the following components:   WBC 104.4 (*)  MCHC 29.1 (*)    RDW 19.8 (*)    Lymphs Abs 98.5 (*)    Abs Immature Granulocytes 0.19 (*)    All other components within normal limits  LACTIC ACID, PLASMA - Abnormal; Notable for the following components:   Lactic Acid, Venous 4.4 (*)    All other components within normal limits  URINALYSIS, ROUTINE W REFLEX MICROSCOPIC - Abnormal; Notable for the following components:   APPearance HAZY (*)    Glucose, UA 50 (*)    Hgb urine dipstick SMALL (*)    Protein, ur 100 (*)    All other components within normal limits  BRAIN NATRIURETIC PEPTIDE - Abnormal; Notable for the following components:   B Natriuretic Peptide 534.0 (*)    All other components within normal limits  LACTATE DEHYDROGENASE - Abnormal; Notable for  the following components:   LDH 250 (*)    All other components within normal limits  BLOOD GAS, ARTERIAL - Abnormal; Notable for the following components:   pO2, Arterial 244 (*)    Acid-Base Excess 3.5 (*)    All other components within normal limits  CBG MONITORING, ED - Abnormal; Notable for the following components:   Glucose-Capillary 200 (*)    All other components within normal limits  TROPONIN I (HIGH SENSITIVITY) - Abnormal; Notable for the following components:   Troponin I (High Sensitivity) 109 (*)    All other components within normal limits  TROPONIN I (HIGH SENSITIVITY) - Abnormal; Notable for the following components:   Troponin I (High Sensitivity) 112 (*)    All other components within normal limits  CULTURE, BLOOD (ROUTINE X 2)  CULTURE, BLOOD (ROUTINE X 2)  LACTIC ACID, PLASMA  RAPID URINE DRUG SCREEN, HOSP PERFORMED  ETHANOL  URIC ACID    EKG EKG Interpretation  Date/Time:  Friday February 14 2022 19:27:05 EDT Ventricular Rate:  103 PR Interval:  169 QRS Duration: 72 QT Interval:  347 QTC Calculation: 455 R Axis:   106 Text Interpretation: Sinus tachycardia Probable left atrial enlargement Abnormal lateral Q waves Anteroseptal infarct, age indeterminate Confirmed by Davonna Belling [phone]) on 02/27/2022 9:03:50 PM  Radiology CT Head Wo Contrast  Result Date: 03/04/2022 CLINICAL DATA:  Mental status changes, unknown cause. EXAM: CT HEAD WITHOUT CONTRAST TECHNIQUE: Contiguous axial images were obtained from the base of the skull through the vertex without intravenous contrast. RADIATION DOSE REDUCTION: This exam was performed according to the departmental dose-optimization program which includes automated exposure control, adjustment of the mA and/or kV according to patient size and/or use of iterative reconstruction technique. COMPARISON:  MRI brain 02/01/2004 report. FINDINGS: Brain: There is slight cerebral cortical atrophy with mild-to-moderate small-vessel  disease of the cerebral white matter, with white matter changes only minimally progressed from 18 years ago. 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 There is a mostly calcified extra-axial parasagittal high left frontal convexity meningioma measuring 1.8 x 1.5 x 1.3 cm, previously 1.5 x 0.9 cm, mildly deforming an underlying parasagittal superior left frontal gyrus but without underlying vasogenic edema. There are aneurysm coils with interval coiling of the previously noted left MCA bifurcation aneurysm. This causes moderate streak artifact through the adjacent temporal lobes and upper posterior fossa structures. Allowing for metallic artifacts there are no findings seen suspicious for acute cortical based infarct, hemorrhage, mass effect or midline shift. The cerebellum and brainstem are unremarkable. The ventricles  are normal in size and position. Basal cisterns are clear. Vascular: There are scattered calcifications of the carotid siphons but no hyperdense central vasculature. Skull: No fracture or focal lesion. Sinuses/Orbits: There is patchy membrane thickening in the ethmoids. There is a 1 cm osteoma in the left frontal sinus. Other visualized sinuses are clear. No mastoid effusion. Other: None. IMPRESSION: 1. 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 Allowing for streak artifacts no acute intracranial process is suspected. 2. 1.8 x 1.5 x 1.3 cm mostly calcified meningioma in the parasagittal high left frontal convexity mildly deforming and underlying parasagittal superior frontal gyrus. This appears to have grown only minimally since 18 years ago. 3. Slight atrophy with mild-to-moderate small-vessel disease with only minimal progression of the small-vessel changes , no increased atrophy in the interval. 4. 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 5.  Sinus membrane disease. Electronically Signed   By: Telford Nab M.D.   On: 03/06/2022 21:21   CT Angio Chest PE W and/or Wo Contrast  Result Date: 02/15/2022 CLINICAL DATA:  Unresponsive EXAM: CT ANGIOGRAPHY CHEST WITH CONTRAST TECHNIQUE: Multidetector CT imaging of the chest was performed using the standard protocol during bolus administration of intravenous contrast. Multiplanar CT image reconstructions and MIPs were obtained to evaluate the vascular anatomy. RADIATION DOSE REDUCTION: This exam was performed according to the departmental dose-optimization program which includes automated exposure control, adjustment of the mA and/or kV according to patient size and/or use of iterative reconstruction technique. CONTRAST:  142m OMNIPAQUE IOHEXOL 350 MG/ML SOLN COMPARISON:  CT 01/06/2020, chest x-ray 03/01/2022, CT chest 02/14/2016 FINDINGS: Cardiovascular: Satisfactory opacification of the pulmonary arteries to the segmental level. No evidence of pulmonary embolism. Aorta is nonaneurysmal. Mild atherosclerosis. No dissection. Normal cardiac size. No significant pericardial effusion. Mediastinum/Nodes: Midline trachea. Endotracheal tube tip several cm above the carina. Esophageal tube tip below the diaphragm but incompletely visualized. Numerous enlarged supraclavicular and axillary nodes, poorly defined due to lack of subcutaneous fat. Negative for thyroid mass. Ill-defined soft tissue density in the mediastinum, may reflect combination of fluid and adenopathy. Lungs/Pleura: Large bilateral pleural effusions with probable passive atelectasis of the lungs. No visible pneumothorax Upper Abdomen: Findings are dictated separately. Musculoskeletal: Sternum is intact. No definite acute osseous abnormality. Anasarca. Multiple large masses within the low axillary regions bilaterally, extending into the upper outer quadrant of the left breast. When compared to 2017, mostly fatty breast tissue was noted previously,  today the breasts are diffusely infiltrated with fluid or soft tissue. This appears worse on the left side. Review of the MIP images confirms the above findings. IMPRESSION: 1. Negative for acute pulmonary embolus. 2. Large bilateral pleural effusions with probable passive atelectasis in the lungs. 3. Lack of subcutaneous fat limits the exam. Extensive supraclavicular and lower neck adenopathy. Bilateral axillary adenopathy. Multiple large left greater than right axillary/chest wall masses, extending into the upper outer quadrants of the breasts, presumably representing adenopathy however the breasts today appear diffusely infiltrated with either edema or soft tissue/mass, recommend correlation with physical exam. Collectively the findings are suspect for lymphoma or metastatic disease. 4. Anasarca Aortic Atherosclerosis (ICD10-I70.0). Electronically Signed   By: KDonavan FoilM.D.   On: 02/27/2022 21:11   CT ABDOMEN PELVIS W CONTRAST  Result Date: 03/02/2022 CLINICAL DATA:  Chronic lymphocytic leukemia, history lymphoma with increasing lower extremity swelling, weight loss. Called EMS initially for abdominal pain and became unresponsive with EMS. EXAM: CT ABDOMEN AND PELVIS WITH CONTRAST  TECHNIQUE: Multidetector CT imaging of the abdomen and pelvis was performed using the standard protocol following bolus administration of intravenous contrast. RADIATION DOSE REDUCTION: This exam was performed according to the departmental dose-optimization program which includes automated exposure control, adjustment of the mA and/or kV according to patient size and/or use of iterative reconstruction technique. CONTRAST:  197m OMNIPAQUE IOHEXOL 350 MG/ML SOLN COMPARISON:  CT abdomen and pelvis with contrast 01/06/2020 and 06/25/2016, also CTA chest earlier today. FINDINGS: Lower chest: Large bilateral pleural effusions are again noted, on the left depressing the diaphragm and slightly shifting the mediastinum to the right with  compressive collapse/consolidation of the lower lobes and portions of the upper and right middle lobes. The cardiac size is normal. Multiple bulky masses in the low axillary regions on the left-greater-than-right are again shown. Largest visible mass on the left is 5.5 x 3.9 cm largest visible mass on the right is 3.9 x 2.3 cm. There are enlarged subcarinal and right hilar nodes partially visible mildly prominent left hilar nodes. There are multiple enlarged retrocrural lymph nodes largest of these is to the right measuring 2.5 x 1.2 cm (series 5 axial 26). Hepatobiliary: Status post cholecystectomy. No biliary dilatation is seen. There is intrahepatic periportal edema which is probably either due to fluid overload or hepatic dysfunction in this case given the interval marked weight loss. There is chronic 1.3 cm subcapsular hypodensity in the left lobe of the liver along side the gallbladder fossa unchanged and probably a small hemangioma or complex cyst. In the anterior segment of the right lobe of the liver on 5:44 there is a small flash filling hemangioma which was seen previously and unchanged. No hepatic mass enhancement is seen. The portal vein is patent within normal caliber limits. Pancreas: there are scattered coarse chronic calcifications in the pancreas consistent with chronic calcific pancreatitis but no findings of acute pancreatitis, mass or ductal dilatation. Spleen:There is heterogeneous splenic enhancement. There could be multiple small hypodense lesions within the spleen or the heterogeneity could be due to bolus phase phenomenon but the spleen is not enlarged for size. Adrenals/Urinary Tract: No adrenal or renal cortical mass enhancement. There is a 1.5 cm cyst of the posterior lower right kidney additional scattered bilateral subcentimeter hypodensities which are too small to characterize. There is no urinary stone or obstruction. The bladder is catheterized contracted and not well seen.  Stomach/Bowel: NGT is coiled in the stomach with the tip in the body of the stomach. There are thickened folds in the stomach and small bowel no small bowel obstruction or gross inflammatory reaction. An appendix is not seen. There is moderate stool retention transverse and descending colon. Thickening of the ascending colon is noted versus underdistention. Vascular/Lymphatic: There is moderate to heavy aortoiliac calcification without aneurysm. The portal vein splenic vein and SMV are patent. There is extensive retroperitoneal adenopathy including in the periportal portacaval and retrocaval spaces with portacaval nodes up to 1.6 cm in short axis periportal nodes up to 1.8 cm in short axis and large confluent mantle of adenopathy extending along the retroperitoneum into in the kidneys lifting the aorta anteriorly encasing the aorta and IVC and continuing inferiorly along the pelvic sidewalls with additional bulky adenopathy in both sidewalls and both inguinal areas. The mantle of adenopathy extends from the level of the SMA inferior to the aortic bifurcation up to 15 cm in length and up to at least 10 cm coronal and 4.7 cm AP on 5:45. There is additional presacral coccygeal confluent  adenopathy which is less well-defined but measures approximately 10 by 4 cm on 5:69. Bilateral bulky pelvic sidewall lymph nodes largest in the external chains are noted largest right external iliac chain node is 4.6 x 3.4 cm on 5:74 largest on the left 5.6 by 3.7 cm on 5:73 encasement of internal and external iliac vasculature without significant vascular narrowing. There is similar bulky bilateral inguinal chain adenopathy. Reproductive: The uterus is intact but not well seen. The ovaries are obscured by overlapping structures and adenopathy. Other: There is a small volume of abdominal and pelvic free ascites. No free air, hemorrhage or abscess is seen. There is diffuse mesenteric haziness and body wall anasarca. Interval weight loss  consistent with cachexia. Musculoskeletal: There are degenerative changes in the lumbar spine but no destructive osseous process. IMPRESSION: 1. Extensive abdominopelvic and inguinal adenopathy, most likely related to lymphoma/leukemia. 2. Numerous small hypodense splenic lesions versus bolus phase phenomenon. 3. Interval weight loss with very little body fat, appearance of cachexia, diffuse anasarca and small-volume abdominal and pelvic ascites. 4. Intrahepatic periportal edema which is probably either due to fluid overload or hepatic dysfunction. The portal vein is patent within normal caliber limits. 5. Chronic calcific pancreatitis but no findings of acute pancreatitis or ductal dilatation. 6. Ascending colitis versus nondistention, remainder of the colon with constipation. 7. Diffuse small intestinal wall thickening with gastric fold thickening, could relate to gastroenteritis or hepatic dysfunction versus reactive from ascites versus congestive or infiltrating disease. 8. Large low axillary masses, with subcarinal and hilar adenopathy. 9. Large pleural effusions. 10. Aortic atherosclerosis. Electronically Signed   By: Telford Nab M.D.   On: 02/17/2022 21:54   DG Chest Portable 1 View  Result Date: 02/19/2022 CLINICAL DATA:  Altered mental status EXAM: PORTABLE CHEST 1 VIEW COMPARISON:  01/09/2015 FINDINGS: Tip of endotracheal tube is 4.8 cm above the carina. Distal portion of enteric tube is seen in the stomach. There is moderate to large left pleural effusion some of which appears to be loculated along the lateral margin of left upper and left mid lung fields. Evaluation of left lower lung fields for infiltrates is limited by the effusion. There is small right pleural effusion. Linear density in the right mid lung fields may suggest fluid in the interlobar fissure or subsegmental atelectasis. Central pulmonary vessels are more prominent. IMPRESSION: Central pulmonary vessels are more prominent  suggesting CHF. Moderate to large left pleural effusion. Small right pleural effusion. Electronically Signed   By: Elmer Picker M.D.   On: 02/19/2022 18:55    Procedures Procedures    Medications Ordered in ED Medications  succinylcholine (ANECTINE) 200 MG/10ML syringe (  Not Given 03/12/2022 1905)  fentaNYL (SUBLIMAZE) 50 MCG/ML injection (  Not Given 02/23/2022 1905)  ketamine HCl 50 MG/5ML SOSY (  Not Given 03/08/2022 1906)  lactated ringers infusion ( Intravenous New Bag/Given 03/03/2022 2154)  midazolam (VERSED) injection 2 mg (0 mg Intravenous Hold 02/13/2022 2059)  vancomycin (VANCOREADY) IVPB 1250 mg/250 mL (has no administration in time range)  ceFEPIme (MAXIPIME) 2 g in sodium chloride 0.9 % 100 mL IVPB (has no administration in time range)  midazolam (VERSED) injection 2 mg (2 mg Intravenous Given 02/17/2022 2028)  midazolam (VERSED) injection 2 mg ( Intravenous Not Given 03/13/2022 1906)  fentaNYL (SUBLIMAZE) injection 50 mcg (50 mcg Intravenous Given 02/22/2022 2210)  fentaNYL (SUBLIMAZE) injection 50-200 mcg (has no administration in time range)  norepinephrine (LEVOPHED) '4mg'$  in 237m (0.016 mg/mL) premix infusion (5 mcg/min Intravenous Infusion Verify  03/07/2022 2152)  rocuronium bromide 100 MG/10ML SOSY (60 mg  Given 03/12/2022 1835)  etomidate (AMIDATE) 2 MG/ML injection (20 mg  Given 02/15/2022 1834)  lactated ringers bolus 1,000 mL (0 mLs Intravenous Stopped 03/14/2022 1940)  ceFEPIme (MAXIPIME) 2 g in sodium chloride 0.9 % 100 mL IVPB (0 g Intravenous Stopped 03/07/2022 2051)  metroNIDAZOLE (FLAGYL) IVPB 500 mg (0 mg Intravenous Stopped 02/18/2022 2053)  iohexol (OMNIPAQUE) 350 MG/ML injection 100 mL (100 mLs Intravenous Contrast Given 02/27/2022 2043)    ED Course/ Medical Decision Making/ A&P                           Medical Decision Making Amount and/or Complexity of Data Reviewed Labs: ordered. Radiology: ordered.  Risk Prescription drug management. Decision regarding hospitalization.  Patient brought in  for reported abdominal pain but then became unresponsive.  History of CLL.  White count is elevated over 100.  Discussed with Dr. Delton Coombes who thinks this is likely worsening the CLL and not necessarily directly related to the acute episode.  Patient was minimally responsive and hypoxic's so was intubated.  Chest x-ray showed good placement of the tube but has bilateral effusions.  After intubation became more hypotensive.  Fluid boluses given to greater than 30/kg.  Later developed a fever.  Antibiotics given to cover unknown cause.  Blood cultures and lactate sent.  Initial lactate elevated but then normalized.  However continued to be somewhat hypotensive.  Discussed with Dr. Patsey Berthold of critical care that accepted in transfer.  Volume status slightly improved and was started on peripheral Levophed.  I have reviewed last oncology note although that was a couple years ago.  CT scan abdomen does show effusions.  Potentially some irritation of colon.  CRITICAL CARE Performed by: Davonna Belling Total critical care time: 40 minutes Critical care time was exclusive of separately billable procedures and treating other patients. Critical care was necessary to treat or prevent imminent or life-threatening deterioration. Critical care was time spent personally by me on the following activities: development of treatment plan with patient and/or surrogate as well as nursing, discussions with consultants, evaluation of patient's response to treatment, examination of patient, obtaining history from patient or surrogate, ordering and performing treatments and interventions, ordering and review of laboratory studies, ordering and review of radiographic studies, pulse oximetry and re-evaluation of patient's condition.         Final Clinical Impression(s) / ED Diagnoses Final diagnoses:  Sepsis, due to unspecified organism, unspecified whether acute organ dysfunction present ([place])  CLL (chronic lymphocytic  leukemia) ([place])    Rx / DC Orders ED Discharge Orders     None         Davonna Belling, MD 03/05/2022 2338

## 2022-02-14 NOTE — ED Notes (Signed)
Date and time results received: 03/02/2022 1930   Test: Troponin Critical Value: 109  Name of Provider Notified: Alvino Chapel, MD

## 2022-02-14 NOTE — ED Notes (Signed)
[facility] called for transport. 

## 2022-02-14 NOTE — ED Notes (Signed)
Date and time results received: 02/18/2022 2224   Test: Troponin Critical Value: 112  Name of Provider Notified: Alvino Chapel, MD

## 2022-02-14 NOTE — ED Provider Notes (Signed)
Procedure Name: Intubation Date/Time: 02/28/2022 6:43 PM Performed by: Marcello Fennel, PA-C Pre-anesthesia Checklist: Patient identified Oxygen Delivery Method: Ambu bag Preoxygenation: 90% Induction Type: IV induction Ventilation: Two handed mask ventilation required Laryngoscope Size: Mac and 4 Grade View: Grade II Tube type: Subglottic suction tube Tube size: 7.5 mm Number of attempts: 2 Airway Equipment and Method: Patient positioned with wedge pillow, Stylet and Video-laryngoscopy Placement Confirmation: ETT inserted through vocal cords under direct vision, Positive ETCO2 and Breath sounds checked- equal and bilateral Secured at: 23 cm Tube secured with: ETT holder Difficulty Due To: Difficulty was unanticipated Comments: First-time attempt was hindered due to video laryngeal scope not working properly, second limited scope used was working properly and intubation was completed without issue.       Marcello Fennel, PA-C 02/21/2022 1854    Davonna Belling, MD 02/15/22 [phone]

## 2022-02-14 NOTE — ED Notes (Signed)
Pt returned from CT Scan 

## 2022-02-15 ENCOUNTER — Inpatient Hospital Stay (HOSPITAL_COMMUNITY): Payer: Medicaid Other

## 2022-02-15 ENCOUNTER — Other Ambulatory Visit (HOSPITAL_COMMUNITY): Payer: Medicaid Other

## 2022-02-15 ENCOUNTER — Inpatient Hospital Stay: Payer: Self-pay

## 2022-02-15 DIAGNOSIS — R0609 Other forms of dyspnea: Secondary | ICD-10-CM

## 2022-02-15 DIAGNOSIS — R6521 Severe sepsis with septic shock: Secondary | ICD-10-CM

## 2022-02-15 DIAGNOSIS — J9601 Acute respiratory failure with hypoxia: Secondary | ICD-10-CM | POA: Diagnosis present

## 2022-02-15 DIAGNOSIS — A419 Sepsis, unspecified organism: Secondary | ICD-10-CM

## 2022-02-15 DIAGNOSIS — R579 Shock, unspecified: Secondary | ICD-10-CM | POA: Diagnosis not present

## 2022-02-15 DIAGNOSIS — C9110 Chronic lymphocytic leukemia of B-cell type not having achieved remission: Secondary | ICD-10-CM

## 2022-02-15 DIAGNOSIS — J90 Pleural effusion, not elsewhere classified: Secondary | ICD-10-CM | POA: Insufficient documentation

## 2022-02-15 LAB — SALICYLATE LEVEL: Salicylate Lvl: <7 mg/dL — ABNORMAL LOW (ref 7.0–30.0)

## 2022-02-15 LAB — GLUCOSE, CAPILLARY
Glucose-Capillary: 102 mg/dL — ABNORMAL HIGH (ref 70–99)
Glucose-Capillary: 118 mg/dL — ABNORMAL HIGH (ref 70–99)
Glucose-Capillary: 123 mg/dL — ABNORMAL HIGH (ref 70–99)
Glucose-Capillary: 72 mg/dL (ref 70–99)
Glucose-Capillary: 76 mg/dL (ref 70–99)
Glucose-Capillary: 94 mg/dL (ref 70–99)
Glucose-Capillary: 96 mg/dL (ref 70–99)

## 2022-02-15 LAB — CBC
HCT: 39.3 % (ref 36.0–46.0)
Hemoglobin: 12.2 g/dL (ref 12.0–15.0)
MCH: 28.2 pg (ref 26.0–34.0)
MCHC: 31 g/dL (ref 30.0–36.0)
MCV: 91 fL (ref 80.0–100.0)
Platelets: 262 10*3/uL (ref 150–400)
RBC: 4.32 MIL/uL (ref 3.87–5.11)
RDW: 18.1 % — ABNORMAL HIGH (ref 11.5–15.5)
WBC: UNDETERMINED 10*3/uL (ref 4.0–10.5)
WBC: 86 10*3/uL (ref 4.0–10.5)
nRBC: 0 % (ref 0.0–0.2)

## 2022-02-15 LAB — BODY FLUID CELL COUNT WITH DIFFERENTIAL
Eos, Fluid: 0 %
Lymphs, Fluid: 92 %
Monocyte-Macrophage-Serous Fluid: 5 % — ABNORMAL LOW (ref 50–90)
Neutrophil Count, Fluid: 3 % (ref 0–25)
Total Nucleated Cell Count, Fluid: 875 cu mm (ref 0–1000)

## 2022-02-15 LAB — COOXEMETRY PANEL
Carboxyhemoglobin: 1.6 % — ABNORMAL HIGH (ref 0.5–1.5)
Methemoglobin: 0.8 % (ref 0.0–1.5)
O2 Saturation: 74.7 %
Total hemoglobin: 12.8 g/dL (ref 12.0–16.0)

## 2022-02-15 LAB — PROTEIN, PLEURAL OR PERITONEAL FLUID: Total protein, fluid: 3.9 g/dL

## 2022-02-15 LAB — MAGNESIUM: Magnesium: 2.1 mg/dL (ref 1.7–2.4)

## 2022-02-15 LAB — ECHOCARDIOGRAM COMPLETE
Area-P 1/2: 2.8 cm2
Height: 65 in
S' Lateral: 1.8 cm
Weight: 1887.14 oz

## 2022-02-15 LAB — HEMOGLOBIN A1C
Hgb A1c MFr Bld: 6.5 % — ABNORMAL HIGH (ref 4.8–5.6)
Mean Plasma Glucose: 139.85 mg/dL

## 2022-02-15 LAB — COMPREHENSIVE METABOLIC PANEL
ALT: 32 U/L (ref 0–44)
AST: 51 U/L — ABNORMAL HIGH (ref 15–41)
Albumin: 3 g/dL — ABNORMAL LOW (ref 3.5–5.0)
Alkaline Phosphatase: 119 U/L (ref 38–126)
Anion gap: 8 (ref 5–15)
BUN: 23 mg/dL (ref 8–23)
CO2: 29 mmol/L (ref 22–32)
Calcium: 8.4 mg/dL — ABNORMAL LOW (ref 8.9–10.3)
Chloride: 105 mmol/L (ref 98–111)
Creatinine, Ser: 0.79 mg/dL (ref 0.44–1.00)
GFR, Estimated: >60 mL/min (ref >60)
Glucose, Bld: 164 mg/dL — ABNORMAL HIGH (ref 70–99)
Potassium: 3.9 mmol/L (ref 3.5–5.1)
Sodium: 142 mmol/L (ref 135–145)
Total Bilirubin: 0.8 mg/dL (ref 0.3–1.2)
Total Protein: 5.3 g/dL — ABNORMAL LOW (ref 6.5–8.1)

## 2022-02-15 LAB — ACETAMINOPHEN LEVEL: Acetaminophen (Tylenol), Serum: <10 ug/mL — ABNORMAL LOW (ref 10–30)

## 2022-02-15 LAB — GLUCOSE, PLEURAL OR PERITONEAL FLUID: Glucose, Fluid: 137 mg/dL

## 2022-02-15 LAB — HIV ANTIBODY (ROUTINE TESTING W REFLEX): HIV Screen 4th Generation wRfx: NONREACTIVE

## 2022-02-15 LAB — GRAM STAIN: Gram Stain: NONE SEEN

## 2022-02-15 LAB — PROCALCITONIN: Procalcitonin: <0.1 ng/mL

## 2022-02-15 LAB — TSH: TSH: 2.747 u[IU]/mL (ref 0.350–4.500)

## 2022-02-15 LAB — TROPONIN I (HIGH SENSITIVITY)
Troponin I (High Sensitivity): 132 ng/L (ref <18)
Troponin I (High Sensitivity): 106 ng/L (ref <18)

## 2022-02-15 LAB — LACTATE DEHYDROGENASE, PLEURAL OR PERITONEAL FLUID: LD, Fluid: 131 U/L — ABNORMAL HIGH (ref 3–23)

## 2022-02-15 LAB — CREATININE, SERUM
Creatinine, Ser: 0.85 mg/dL (ref 0.44–1.00)
GFR, Estimated: >60 mL/min (ref >60)

## 2022-02-15 LAB — PHOSPHORUS: Phosphorus: 4.3 mg/dL (ref 2.5–4.6)

## 2022-02-15 LAB — LIPASE, BLOOD: Lipase: 22 U/L (ref 11–51)

## 2022-02-15 LAB — MRSA NEXT GEN BY PCR, NASAL: MRSA by PCR Next Gen: NOT DETECTED

## 2022-02-15 MED ORDER — NOREPINEPHRINE 16 MG/250ML-% IV SOLN
0.0000 ug/min | INTRAVENOUS | Status: DC
  Administered 2022-02-15: 18 ug/min via INTRAVENOUS
  Administered 2022-02-16: 11 ug/min via INTRAVENOUS
  Filled 2022-02-15 (×2): qty 250

## 2022-02-15 MED ORDER — SODIUM CHLORIDE 0.9 % IV SOLN
250.0000 mL | INTRAVENOUS | Status: DC
  Administered 2022-02-15 – 2022-03-09 (×3): 250 mL via INTRAVENOUS

## 2022-02-15 MED ORDER — NOREPINEPHRINE 4 MG/250ML-% IV SOLN
2.0000 ug/min | INTRAVENOUS | Status: DC
  Administered 2022-02-15: 17 ug/min via INTRAVENOUS
  Administered 2022-02-15: 13 ug/min via INTRAVENOUS
  Filled 2022-02-15 (×2): qty 250

## 2022-02-15 MED ORDER — FENTANYL CITRATE (PF) 100 MCG/2ML IJ SOLN
50.0000 ug | INTRAMUSCULAR | Status: AC | PRN
  Administered 2022-02-15 (×3): 50 ug via INTRAVENOUS
  Filled 2022-02-15: qty 2

## 2022-02-15 MED ORDER — DOCUSATE SODIUM 50 MG/5ML PO LIQD
100.0000 mg | Freq: Two times a day (BID) | ORAL | Status: DC
Start: 2022-02-15 — End: 2022-02-16
  Administered 2022-02-15 – 2022-02-16 (×3): 100 mg
  Filled 2022-02-15 (×4): qty 10

## 2022-02-15 MED ORDER — FENTANYL CITRATE (PF) 100 MCG/2ML IJ SOLN
50.0000 ug | INTRAMUSCULAR | Status: DC | PRN
  Administered 2022-02-15: 50 ug via INTRAVENOUS
  Filled 2022-02-15: qty 2

## 2022-02-15 MED ORDER — FENTANYL CITRATE (PF) 100 MCG/2ML IJ SOLN
50.0000 ug | INTRAMUSCULAR | Status: DC | PRN
  Administered 2022-02-15 – 2022-02-16 (×8): 50 ug via INTRAVENOUS
  Administered 2022-02-16: 100 ug via INTRAVENOUS
  Filled 2022-02-15 (×8): qty 2

## 2022-02-15 MED ORDER — POLYETHYLENE GLYCOL 3350 17 G PO PACK
17.0000 g | PACK | Freq: Every day | ORAL | Status: DC
  Administered 2022-02-16: 17 g
  Filled 2022-02-15 (×2): qty 1

## 2022-02-15 MED ORDER — FUROSEMIDE 10 MG/ML IJ SOLN
40.0000 mg | Freq: Once | INTRAMUSCULAR | Status: AC
  Administered 2022-02-15: 40 mg via INTRAVENOUS
  Filled 2022-02-15: qty 4

## 2022-02-15 MED ORDER — SODIUM CHLORIDE 0.9% FLUSH: 10.0000 mL | INTRAVENOUS | Status: DC | PRN

## 2022-02-15 MED ORDER — PANTOPRAZOLE 2 MG/ML SUSPENSION
40.0000 mg | Freq: Every day | ORAL | Status: DC
  Administered 2022-02-15 – 2022-02-16 (×2): 40 mg
  Filled 2022-02-15 (×2): qty 20

## 2022-02-15 MED ORDER — NOREPINEPHRINE 4 MG/250ML-% IV SOLN: 0.0000 ug/min | INTRAVENOUS | Status: DC

## 2022-02-15 MED ORDER — CHLORHEXIDINE GLUCONATE CLOTH 2 % EX PADS
6.0000 | MEDICATED_PAD | Freq: Every day | CUTANEOUS | Status: DC
Start: 2022-02-15 — End: 2022-02-18
  Administered 2022-02-15 – 2022-02-17 (×4): 6 via TOPICAL

## 2022-02-15 MED ORDER — ORAL CARE MOUTH RINSE
15.0000 mL | OROMUCOSAL | Status: DC
  Administered 2022-02-15 – 2022-02-16 (×15): 15 mL via OROMUCOSAL

## 2022-02-15 MED ORDER — ACETAMINOPHEN 325 MG PO TABS
650.0000 mg | ORAL_TABLET | ORAL | Status: DC | PRN
  Administered 2022-02-15 – 2022-03-06 (×18): 650 mg
  Filled 2022-02-15 (×19): qty 2

## 2022-02-15 MED ORDER — CHLORHEXIDINE GLUCONATE 0.12% ORAL RINSE (MEDLINE KIT)
15.0000 mL | Freq: Two times a day (BID) | OROMUCOSAL | Status: DC
  Administered 2022-02-15 – 2022-03-07 (×38): 15 mL via OROMUCOSAL

## 2022-02-15 MED ORDER — METRONIDAZOLE 500 MG/100ML IV SOLN
500.0000 mg | Freq: Two times a day (BID) | INTRAVENOUS | Status: DC
  Administered 2022-02-15 – 2022-02-16 (×2): 500 mg via INTRAVENOUS
  Filled 2022-02-15 (×2): qty 100

## 2022-02-15 MED ORDER — LACTATED RINGERS IV BOLUS
1000.0000 mL | Freq: Once | INTRAVENOUS | Status: AC
  Administered 2022-02-15: 1000 mL via INTRAVENOUS

## 2022-02-15 MED ORDER — INSULIN ASPART 100 UNIT/ML IJ SOLN: 0.0000 [IU] | INTRAMUSCULAR | Status: DC

## 2022-02-15 MED ORDER — ENOXAPARIN SODIUM 30 MG/0.3ML IJ SOSY
30.0000 mg | PREFILLED_SYRINGE | INTRAMUSCULAR | Status: DC
  Filled 2022-02-15 (×2): qty 0.3

## 2022-02-15 MED ORDER — SODIUM CHLORIDE 0.9% FLUSH
10.0000 mL | Freq: Two times a day (BID) | INTRAVENOUS | Status: DC
  Administered 2022-02-15: 30 mL
  Administered 2022-02-15: 10 mL
  Administered 2022-02-16: 30 mL
  Administered 2022-02-16 – 2022-02-19 (×7): 10 mL
  Administered 2022-02-20: 20 mL
  Administered 2022-02-20 – 2022-02-21 (×2): 10 mL
  Administered 2022-02-21: 20 mL
  Administered 2022-02-22 – 2022-02-26 (×10): 10 mL

## 2022-02-15 NOTE — Procedures (Signed)
Thoracentesis  Procedure Note  [name]  [id number]  09/18/1960  Date:02/15/22  Time:1:31 PM   Provider Performing:Trino Higinbotham Jerilynn Mages Verlee Monte   Procedure: Thoracentesis with imaging guidance (62952)  Indication(s) Pleural Effusion  Consent Risks of the procedure as well as the alternatives and risks of each were explained to the patient and/or caregiver.  Consent for the procedure was obtained and is signed in the bedside chart  Anesthesia Topical only with 1% lidocaine    Time Out Verified patient identification, verified procedure, site/side was marked, verified correct patient position, special equipment/implants available, medications/allergies/relevant history reviewed, required imaging and test results available.   Sterile Technique Maximal sterile technique including full sterile barrier drape, hand hygiene, sterile gown, sterile gloves, mask, hair covering, sterile ultrasound probe cover (if used).  Procedure Description Ultrasound was used to identify appropriate pleural anatomy for placement and overlying skin marked.  Area of drainage cleaned and draped in sterile fashion. Lidocaine was used to anesthetize the skin and subcutaneous tissue.  2000 cc's of milky appearing fluid was drained from the left pleural space. Catheter then removed and bandaid applied to site.   Complications/Tolerance None; patient tolerated the procedure well. Chest X-ray is ordered to confirm no post-procedural complication.   EBL Minimal   Specimen(s) Pleural fluid sent for culture, chemistries, and cytology

## 2022-02-15 NOTE — Progress Notes (Addendum)
eLink Physician-Brief Progress Note Patient Name: [name] DOB: 03/27/1961 MRN: [id number]   Date of Service  02/15/2022  HPI/Events of Note  Continues to require >10 levophed Current access via PIV   eICU Interventions  Will plan for CVC placement for pressors and blood draws. Son at bedside agrees to plan   5:55 AM CXR reviewed. OK to use central line. Levophed orders adjusted  Intervention Category Intermediate Interventions: Hypotension - evaluation and management  [name] 02/15/2022, 4:17 AM

## 2022-02-15 NOTE — Progress Notes (Signed)
  Echocardiogram 2D Echocardiogram has been performed.  [name] 02/15/2022, 8:56 AM

## 2022-02-15 NOTE — Progress Notes (Signed)
NAME:  [name], MRN:  [id number], DOB:  [birth year], LOS: 1 ADMISSION DATE:  02/23/2022, CONSULTATION DATE:  6/2 REFERRING MD:  Dr. Alvino Chapel, CHIEF COMPLAINT:  Unresponsive; Acute resp failure   History of Present Illness:  Patient is a 61 year old female with pertinent PMH of CLL, depression, anxiety, HLD, HTN presents to [facility] ED on 6/2 unresponsive.  On 6/2 patient called EMS for abdominal pain.  Patient initially was alert and talking but became unresponsive with AMS.  Patient was hypoxic with agonal breathing and was placed on NRB.  Patient was noted to be hypotensive.  Patient transported to [facility] ED.  Upon arrival to [facility] ED patient remained unresponsive with agonal respirations.  Patient intubated for airway protection and hypoxia.  BP initially 81/65.  Given IV fluids.  Started on levo.  Fever 100.9 F and WBC 104.4.  Started on cefepime, Flagyl, Vanco.  Cultures pending.  Glucose 198, BNP 534, troponin 109 then 112, LA 4.4 then 1.8, UA unremarkable, UDS negative.  CXR with large left pleural effusion and small right pleural effusion.  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  CT abdomen with ascending colitis constipation, small intestine wall thickening possibly related to gastroenteritis; small volume ascites; extensive abdominopelvic and inguinal adenopathy.  PCCM consulted for ICU admission and transfer to [facility].  Pertinent  Medical History   Past Medical History:  Diagnosis Date   Anxiety    Chronic abdominal pain    resolved   Chronic lymphocytic leukemia (CLL), B-cell ([place]) 04/04/2016   CLL (chronic lymphocytic leukemia) ([place])    Depression with anxiety 07/07/2015   Family history of hyperlipidemia 12/14/2019   H. pylori infection 2/09   s/p prevpac   HAND PAIN, BILATERAL 01/29/2009   Qualifier: Diagnosis of  By: Moshe Cipro MD, Margaret     Hypertension    Iron deficiency 11/17/2016    Lymphadenopathy 01/17/2016   Medical non-compliance 08/22/2017   Nicotine addiction    Psychosis ([place])    social worker states that patient does not like to talk about this and will stop her meds if you try and discuss this with her.   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   Objective   Blood pressure 93/77, pulse 91, temperature (!) 101.5 F (38.6 C), temperature source Bladder, resp. rate 18, height '5\' 5"'$  (1.651 m), weight 53.5 kg, last menstrual period 07/25/2016, SpO2 100 %.    Vent Mode: PRVC FiO2 (%):  [40 %-100 %] 40 % Set Rate:  [18 bmp] 18 bmp Vt Set:  [460 mL] 460 mL PEEP:  [5 cmH20] 5 cmH20 Plateau Pressure:  [21 cmH20-23 cmH20] 23 cmH20   Intake/Output Summary (Last 24 hours) at 02/15/2022 0731 Last data filed at 02/15/2022 0700 Gross per 24 hour  Intake 3888.02 ml  Output 1310 ml  Net 2578.02 ml   Filed Weights   03/06/2022 1840 02/15/22 0114  Weight: 64 kg 53.5 kg    Examination: General appearance: 61 y.o., female, intubated Eyes: PERRL, tracking appropriately Lungs: CTAB, no crackles, no wheeze, equal chest rise CV: RRR, no murmur  Abdomen: Soft, non-tender; non-distended,  BS present  Extremities: trace peripheral edema, warm Neuro: grossly nonfocal, follows commands  ?RA thrombus as below on bedside US:   Labs/imaging reviewed by me   Resolved Hospital Problem list     Assessment & Plan:   Acute respiratory failure with hypoxia B/L pleural effusion L>R P: -ABX as below but -f/u sputum culture -LTVV -VAP bundle in place -Daily SAT and SBT -PAD protocol in place -fentanyl pushes for RASS goal 0 -thoracentesis later today before extubation  Shock: possible  sepsis unclear source.  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 Seems to be clearing already.  P: -Limit sedating meds -Wean sedation for RASS 0  Elevated BNP Possible RA thrombus/vegetation P: -echo stat -coox  Hyperleukocytosis CLL/SLL: seen by Dr. Delton Coombes outpatient and was last seen 12/2019; ED physician spoke with Dr. Delton Coombes who believes this is related to her CLL and not acute episode; does not appear to have tumor lysis syndrome. P: -oncology consulted by [facility] ED physician, will reinvolve here today -trend bmp, phosph, uric acid -Trend WBC/fever curve -Continue broad-spectrum antibiotics; follow cultures  Hyperglycemia P: -Check A1c -SSI and CBG monitoring  Abdominal Pain:  likely due to adenopathy P: -CTM  HTN HLD P: -hold home lisinopril in setting of hypotension -no home statin listed  Depression/anxiety P: -no home meds listed   Best Practice (right click and "Reselect all SmartList Selections" daily)   Diet/type: NPO w/ meds via tube DVT prophylaxis: LMWH GI prophylaxis: PPI Lines: N/A Foley:  N/A Code Status:  DNR Last date of multidisciplinary goals of care discussion [6/3 spoke with son Harrell Gave over phone and updated. He states that she is an active DNR and has told him that she would not want aggressive measures done. He's okay with keeping her intubated for now until we can safely extubate. He does not want compressions or shock. DNR placed in chart.]  Critical care time: 38 minutes    [place]  02/15/2022, 7:31 AM  Please see [email or web address] for  pager details.  From 7A-7P if no response, please call [phone]. After hours, please call ELink [phone].

## 2022-02-15 NOTE — Procedures (Signed)
Central Venous Catheter Insertion Procedure Note  [name]  [id number]  Nov 17, 1960  Date:02/15/22  Time:5:17 AM   Provider Performing:Bhavika Schnider D Rollene Rotunda   Procedure: Insertion of Non-tunneled Central Venous [phone]) with US guidance (36122)   Indication(s) Medication administration  Consent Risks of the procedure as well as the alternatives and risks of each were explained to the patient and/or caregiver.  Consent for the procedure was obtained and is signed in the bedside chart  Anesthesia Topical only with 1% lidocaine   Timeout Verified patient identification, verified procedure, site/side was marked, verified correct patient position, special equipment/implants available, medications/allergies/relevant history reviewed, required imaging and test results available.  Sterile Technique Maximal sterile technique including full sterile barrier drape, hand hygiene, sterile gown, sterile gloves, mask, hair covering, sterile ultrasound probe cover (if used).  Procedure Description Area of catheter insertion was cleaned with chlorhexidine and draped in sterile fashion.  With real-time ultrasound guidance a central venous catheter was placed into the right internal jugular vein. Nonpulsatile blood flow and easy flushing noted in all ports.  The catheter was sutured in place and sterile dressing applied.     Complications/Tolerance None; patient tolerated the procedure well. Chest X-ray is ordered to verify placement for internal jugular or subclavian cannulation.   Chest x-ray is not ordered for femoral cannulation.  EBL Minimal  Specimen(s) None  JD Rexene Agent [facility] 02/15/2022, 5:17 AM  Please see [email or web address] for pager details.  From 7A-7P if no response, please call [phone]. After hours, please call ELink [phone].

## 2022-02-15 NOTE — Progress Notes (Signed)
Secure chat with Dr Duwayne Heck re PICC order. CVC placed over night for medication needs.  New order to cancel the PICC order.

## 2022-02-15 NOTE — Progress Notes (Addendum)
eLink Physician-Brief Progress Note Patient Name: [name] DOB: Jun 29, 1961 MRN: [id number]   Date of Service  02/15/2022  HPI/Events of Note  20F with CLLwho initially called EMS for abdominal pain and became unresponsive, hypotensive and hypoxemic in transit. In the ED was intubated. WBC 100. Hematology consulted thought related to worsening CLL but not related to acute issues. Antibiotics started. Required levophed. Transferred from [facility] to [facility]  CT head with prior aneurysm coils in left MCA. Stable meningioma 1.8 x 1.5 .1.3  ABG with appropriate ventilation and oxygenation.  CXR ETT in place with large left loculated pleural effusion and small right pleural effusion with atelectasis. CTA and A/P with extensive adenopathy and anasarca  CMET unremarkable with normal electrolytes. WBC 104  UA/UDS/acetaminophen/salicylate neg  Lactic acidosis resolved  BNP 534, trop 786>754>492  DNR. [place] with pressors  eICU Interventions  Stopped mIVF Diurese Continue levophed for MAP >65 Echo Continue abx     Intervention Category Evaluation Type: New Patient Evaluation  [name] 02/15/2022, 1:11 AM

## 2022-02-15 NOTE — Procedures (Signed)
Insertion of Chest Tube Procedure Note  [name]  [id number]  1961-07-26  Date:02/15/22  Time:4:47 PM    Provider Performing: Otilio Carpen [name]   Procedure: Chest Tube Insertion (32202)  Indication(s) Effusion  Consent Risks of the procedure as well as the alternatives and risks of each were explained to the patient and/or caregiver.  Consent for the procedure was obtained and is signed in the bedside chart  Anesthesia Topical only with 1% lidocaine    Time Out Verified patient identification, verified procedure, site/side was marked, verified correct patient position, special equipment/implants available, medications/allergies/relevant history reviewed, required imaging and test results available.   Sterile Technique Maximal sterile technique including full sterile barrier drape, hand hygiene, sterile gown, sterile gloves, mask, hair covering, sterile ultrasound probe cover (if used).   Procedure Description Ultrasound used to identify appropriate pleural anatomy for placement and overlying skin marked. Area of placement cleaned and draped in sterile fashion.  A 14 French pigtail pleural catheter was placed into the right pleural space using Seldinger technique. Appropriate return of milky, thin, fluid was obtained.  The tube was connected to atrium and placed on -20 cm H2O wall suction.   Complications/Tolerance None; patient tolerated the procedure well. Chest X-ray is ordered to verify placement.   EBL Minimal  Specimen(s) fluid     Otilio Carpen [name], PA-C

## 2022-02-15 NOTE — Progress Notes (Signed)
Date and time results received: 02/15/22 02.53 (use smartphrase ".now" to insert current time)  Test: Troponin (High Sensitivity) Critical Value: 132  Name of Provider Notified: Elink notified- Dr. Loanne Drilling   Orders Received? Or Actions Taken?:  See orders Milford Cage, RN

## 2022-02-16 ENCOUNTER — Inpatient Hospital Stay (HOSPITAL_COMMUNITY): Payer: Medicaid Other

## 2022-02-16 DIAGNOSIS — J9601 Acute respiratory failure with hypoxia: Secondary | ICD-10-CM | POA: Diagnosis not present

## 2022-02-16 DIAGNOSIS — C9110 Chronic lymphocytic leukemia of B-cell type not having achieved remission: Secondary | ICD-10-CM | POA: Diagnosis not present

## 2022-02-16 LAB — CBC WITH DIFFERENTIAL/PLATELET
Abs Immature Granulocytes: 0 10*3/uL (ref 0.00–0.07)
Basophils Absolute: 1 10*3/uL — ABNORMAL HIGH (ref 0.0–0.1)
Basophils Relative: 1 %
Eosinophils Absolute: 0 10*3/uL (ref 0.0–0.5)
Eosinophils Relative: 0 %
HCT: 39.9 % (ref 36.0–46.0)
Hemoglobin: 12.8 g/dL (ref 12.0–15.0)
Lymphocytes Relative: 87 %
Lymphs Abs: 82.9 10*3/uL — ABNORMAL HIGH (ref 0.7–4.0)
MCH: 28.7 pg (ref 26.0–34.0)
MCHC: 32.1 g/dL (ref 30.0–36.0)
MCV: 89.5 fL (ref 80.0–100.0)
Monocytes Absolute: 0 10*3/uL — ABNORMAL LOW (ref 0.1–1.0)
Monocytes Relative: 0 %
Neutro Abs: 11.4 10*3/uL — ABNORMAL HIGH (ref 1.7–7.7)
Neutrophils Relative %: 12 %
Platelets: 190 10*3/uL (ref 150–400)
RBC: 4.46 MIL/uL (ref 3.87–5.11)
RDW: 18.1 % — ABNORMAL HIGH (ref 11.5–15.5)
WBC: 95.3 10*3/uL (ref 4.0–10.5)
nRBC: 0 % (ref 0.0–0.2)

## 2022-02-16 LAB — COMPREHENSIVE METABOLIC PANEL
ALT: 25 U/L (ref 0–44)
AST: 31 U/L (ref 15–41)
Albumin: 2.3 g/dL — ABNORMAL LOW (ref 3.5–5.0)
Alkaline Phosphatase: 90 U/L (ref 38–126)
Anion gap: 7 (ref 5–15)
BUN: 25 mg/dL — ABNORMAL HIGH (ref 8–23)
CO2: 27 mmol/L (ref 22–32)
Calcium: 8.2 mg/dL — ABNORMAL LOW (ref 8.9–10.3)
Chloride: 110 mmol/L (ref 98–111)
Creatinine, Ser: 0.9 mg/dL (ref 0.44–1.00)
GFR, Estimated: >60 mL/min (ref >60)
Glucose, Bld: 103 mg/dL — ABNORMAL HIGH (ref 70–99)
Potassium: 4 mmol/L (ref 3.5–5.1)
Sodium: 144 mmol/L (ref 135–145)
Total Bilirubin: 1 mg/dL (ref 0.3–1.2)
Total Protein: 4.8 g/dL — ABNORMAL LOW (ref 6.5–8.1)

## 2022-02-16 LAB — GLUCOSE, CAPILLARY
Glucose-Capillary: 101 mg/dL — ABNORMAL HIGH (ref 70–99)
Glucose-Capillary: 66 mg/dL — ABNORMAL LOW (ref 70–99)
Glucose-Capillary: 73 mg/dL (ref 70–99)
Glucose-Capillary: 79 mg/dL (ref 70–99)
Glucose-Capillary: 81 mg/dL (ref 70–99)
Glucose-Capillary: 91 mg/dL (ref 70–99)
Glucose-Capillary: 97 mg/dL (ref 70–99)

## 2022-02-16 LAB — DIC (DISSEMINATED INTRAVASCULAR COAGULATION)PANEL
D-Dimer, Quant: 3.89 ug/mL-FEU — ABNORMAL HIGH (ref 0.00–0.50)
Fibrinogen: 450 mg/dL (ref 210–475)
INR: 1.3 — ABNORMAL HIGH (ref 0.8–1.2)
Platelets: 181 10*3/uL (ref 150–400)
Prothrombin Time: 16.4 seconds — ABNORMAL HIGH (ref 11.4–15.2)
Smear Review: NONE SEEN
aPTT: 32 seconds (ref 24–36)

## 2022-02-16 LAB — LIPID PANEL
Cholesterol: 122 mg/dL (ref 0–200)
HDL: 19 mg/dL — ABNORMAL LOW (ref >40)
LDL Cholesterol: 82 mg/dL (ref 0–99)
Total CHOL/HDL Ratio: 6.4 RATIO
Triglycerides: 106 mg/dL (ref <150)
VLDL: 21 mg/dL (ref 0–40)

## 2022-02-16 LAB — URINE CULTURE: Culture: NO GROWTH

## 2022-02-16 LAB — LACTATE DEHYDROGENASE: LDH: 190 U/L (ref 98–192)

## 2022-02-16 MED ORDER — DOCUSATE SODIUM 100 MG PO CAPS
100.0000 mg | ORAL_CAPSULE | Freq: Two times a day (BID) | ORAL | Status: DC
  Administered 2022-02-16 – 2022-03-25 (×56): 100 mg via ORAL
  Filled 2022-02-16 (×67): qty 1

## 2022-02-16 MED ORDER — ENOXAPARIN SODIUM 40 MG/0.4ML IJ SOSY
40.0000 mg | PREFILLED_SYRINGE | INTRAMUSCULAR | Status: DC
  Administered 2022-02-16 – 2022-03-25 (×34): 40 mg via SUBCUTANEOUS
  Filled 2022-02-16 (×34): qty 0.4

## 2022-02-16 MED ORDER — SODIUM CHLORIDE 0.9 % IV SOLN
2.0000 g | Freq: Two times a day (BID) | INTRAVENOUS | Status: AC
  Administered 2022-02-16 – 2022-02-18 (×5): 2 g via INTRAVENOUS
  Filled 2022-02-16 (×5): qty 12.5

## 2022-02-16 MED ORDER — METRONIDAZOLE 500 MG/100ML IV SOLN
500.0000 mg | Freq: Two times a day (BID) | INTRAVENOUS | Status: DC
  Administered 2022-02-16 – 2022-02-17 (×2): 500 mg via INTRAVENOUS
  Filled 2022-02-16 (×2): qty 100

## 2022-02-16 NOTE — Consult Note (Signed)
Hematology/Oncology Consult Note  Clinical Summary: [name] is a 61 year old female with medical history significant for CLL previously followed by Dr. 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  HPI:  [name] is a 61 year old female with medical history significant for CLL who is currently admitted to the ICU intubated.  On 02/25/2022 the patient was brought to the emergency department via EMS and was unresponsive.  She had been previously complaining of abdominal pain.  She was reportedly having oxygen saturations between 30 and 80%.  Due to this she was intubated and transferred to the ICU for further evaluation.  CT angio pulmonary was performed which was negative for pulmonary emboli but she was noted to have large bilateral pleural effusions with probable passive atelectasis in the lungs.  Was also noted to be bilateral axillary adenopathy with multiple large left greater than right axillary chest wall masses extending into the upper quadrants of the breasts.  CT abdomen pelvis showed extensive abdominal pelvic and inguinal adenopathy.  Initial labs showed white blood cell count 104.4, hemoglobin 12.9, and platelets of 306.  On review of prior records the patient was previously seen by Dr. Delton Coombes at [facility].  She was last seen by Dr. Delton Coombes on 01/09/2020 at which time she was undergoing observation.  She had had a recent CT scan which showed moderately enlarged periaortic, retroperitoneal, periportal, iliac, and inguinal lymph nodes with a normal-sized spleen.  She appears to been lost to follow-up after that visit.  On exam today [name] is unaccompanied.  She was extubated earlier today and her voice is raspy.  Conversation was limited due to this.  She reports that she is thirsty.  She does not remember much prior to her episode of becoming unconscious and ending up in the hospital.  She reports that she  is not having any current fevers, chills, sweats, nausea, ming or diarrhea.  She is aware that she has had lymphadenopathy under her arms in the groin area but is unsure how long this is going on for.  A full 10 point ROS was otherwise negative.  O:  Vitals:   02/16/22 1215 02/16/22 1230  BP: 104/74 106/70  Pulse: 84 83  Resp: 17 19  Temp: 99.7 F (37.6 C) 99.7 F (37.6 C)  SpO2: 100% 100%      Latest Ref Rng & Units 02/16/2022    3:18 AM 02/15/2022    5:22 AM 02/15/2022    1:53 AM  CMP  Glucose 70 - 99 mg/dL 103   164     BUN 8 - 23 mg/dL 25   23     Creatinine 0.44 - 1.00 mg/dL 0.90   0.79   0.85    Sodium 135 - 145 mmol/L 144   142     Potassium 3.5 - 5.1 mmol/L 4.0   3.9     Chloride 98 - 111 mmol/L 110   105     CO2 22 - 32 mmol/L 27   29     Calcium 8.9 - 10.3 mg/dL 8.2   8.4     Total Protein 6.5 - 8.1 g/dL 4.8   5.3     Total Bilirubin 0.3 - 1.2 mg/dL 1.0   0.8     Alkaline Phos 38 - 126 U/L 90   119     AST 15 - 41 U/L 31   51     ALT 0 -  44 U/L 25   32         Latest Ref Rng & Units 02/16/2022    3:18 AM 02/15/2022    5:22 AM 02/15/2022    3:19 AM  CBC  WBC 4.0 - 10.5 K/uL 95.3   86.0   QUANTITY NOT SUFFICIENT, UNABLE TO PERFORM TEST    Hemoglobin 12.0 - 15.0 g/dL 12.8   12.2     Hematocrit 36.0 - 46.0 % 39.9   39.3     Platelets 150 - 400 K/uL 190     181   262         GENERAL: Chronically ill-appearing cachectic African-American woman, in NAD  SKIN: skin color, texture, turgor are normal, no rashes or significant lesions EYES: conjunctiva are pink and non-injected, sclera clear LUNGS: clear to auscultation and percussion with normal breathing effort HEART: regular rate & rhythm and no murmurs and no lower extremity edema ABDOMEN: soft, non-tender, non-distended, normal bowel sounds Musculoskeletal: no cyanosis of digits and no clubbing.  Markedly dry skin of the feet bilaterally as well as elongated toenails PSYCH: alert & oriented x 3, fluent speech NEURO: no  focal motor/sensory deficits  Assessment/Plan: [name] is a 61 year old female with medical history significant for CLL previously followed by Dr. 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  # Diffuse Lymphomadenopathy/Prior Diagnosis of CLL -- There is been marked enlargement of the lymph nodes in the interim since the patient's last visit in 2021.  Concern for possible transformation into a more aggressive disease process --Recommend excisional biopsy of lymph node.  Please consult surgical service.  Would recommend consideration of axillary lymph nodes first inguinal lymph nodes --Remarkably patient does not appear to have splenomegaly or any cytopenias --Once biopsy is complete would recommend pulsing with steroids, starting with prednisone 1 mg/kg p.o. daily.  Would not start this until after biopsy is complete as this may alter results. --We will order CLL prognostic panel, IGHV, and Zap 70 with next of labs --At time of discharge patient will need follow-up with Dr. Delton Coombes at [facility] --Oncology will continue to follow.  # Chylothorax  # Respiratory Failure --Management per pulmonary and primary team.  Ledell Peoples, MD Department of Hematology/Oncology [place] [facility] Phone: [phone] Pager: [phone] Email: [email or web address]

## 2022-02-16 NOTE — Procedures (Signed)
Extubation Procedure Note  Patient Details:   Name: [name] DOB: 1961/09/15 MRN: [id number]   Airway Documentation:    Vent end date: 02/16/22 Vent end time: 1415   Evaluation  O2 sats: stable throughout Complications: No apparent complications Patient did tolerate procedure well. Bilateral Breath Sounds: Clear, Diminished   Yes  Pt extubated per physician order. Pt suctioned via ETT and orally prior, positive cuff leak. Upon extubation pt able to speak name, give a good cough and no stridor heard at this time. Pt placed on 3L nasal cannula. RT will continue to monitor and be available as needed.   [name] [name] 02/16/2022, 2:21 PM

## 2022-02-16 NOTE — Progress Notes (Addendum)
NAME:  [name], MRN:  [id number], DOB:  Oct 12, 1960, LOS: 2 ADMISSION DATE:  03/06/2022, CONSULTATION DATE:  6/2 REFERRING MD:  Dr. Alvino Chapel, CHIEF COMPLAINT:  Unresponsive; Acute resp failure   History of Present Illness:  Patient is a 61 year old female with pertinent PMH of CLL, depression, anxiety, HLD, HTN presents to [facility] ED on 6/2 unresponsive.  On 6/2 patient called EMS for abdominal pain.  Patient initially was alert and talking but became unresponsive with AMS.  Patient was hypoxic with agonal breathing and was placed on NRB.  Patient was noted to be hypotensive.  Patient transported to [facility] ED.  Upon arrival to [facility] ED patient remained unresponsive with agonal respirations.  Patient intubated for airway protection and hypoxia.  BP initially 81/65.  Given IV fluids.  Started on levo.  Fever 100.9 F and WBC 104.4.  Started on cefepime, Flagyl, Vanco.  Cultures pending.  Glucose 198, BNP 534, troponin 109 then 112, LA 4.4 then 1.8, UA unremarkable, UDS negative.  CXR with large left pleural effusion and small right pleural effusion.  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  CT abdomen with ascending colitis constipation, small intestine wall thickening possibly related to gastroenteritis; small volume ascites; extensive abdominopelvic and inguinal adenopathy.  PCCM consulted for ICU admission and transfer to [facility].  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 pylori infection 2/09   s/p prevpac   HAND PAIN, BILATERAL 01/29/2009   Qualifier: Diagnosis of  By: Moshe Cipro MD, Margaret     Hypertension    Iron deficiency 11/17/2016    Lymphadenopathy 01/17/2016   Medical non-compliance 08/22/2017   Nicotine addiction    Psychosis ([place])    social worker states that patient does not like to talk about this and will stop her meds if you try and discuss this with her.   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 Lots of milky pleural fluid removed yesterday - exudative, cx negative, lymphocytic. 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  Objective   Blood pressure 112/75, pulse 81, temperature 99.3 F (37.4 C), resp. rate 18, height '5\' 5"'$  (1.651 m), weight 50.4 kg, last menstrual period 07/25/2016, SpO2 100 %.    Vent Mode: PSV;CPAP FiO2 (%):  [40 %-45 %] 40 % Set Rate:  [18 bmp] 18 bmp Vt Set:  [460 mL] 460 mL PEEP:  [5 cmH20] 5 cmH20 Pressure Support:  [12 cmH20] 12 cmH20 Plateau Pressure:  [20 cmH20-22 cmH20] 21 cmH20   Intake/Output Summary (Last 24 hours) at 02/16/2022 0953 Last data filed at 02/16/2022 0800 Gross per 24 hour  Intake 1188.25 ml  Output 5055 ml  Net -3866.75 ml   Filed Weights   02/13/2022 1840 02/15/22 0114 02/16/22 0325  Weight: 64 kg 53.5 kg 50.4 kg  Examination: General appearance: 61 y.o., female, intubated Eyes: PERRL, tracking appropriately Lungs: CTAB, no crackles, no wheeze, equal chest rise CV: RRR, no murmur  Abdomen: Soft, non-tender; non-distended, BS present  Extremities: trace peripheral edema, warm Neuro: grossly nonfocal, follows commands   Labs/imaging reviewed by me   Resolved Hospital Problem list   Acute encephalopathy  Assessment & Plan:   Acute respiratory failure with hypoxia B/L exudative pleural effusion L>R Probably  mostly due to her effusions - 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 Now s/p L thora and right pigtail placement 6/3 P: -ABX as below  -f/u sputum culture -LTVV -VAP bundle in place -PAD protocol in place -fentanyl pushes for RASS goal 0 -right pigtail to suction -20 -f/u pleural fluid cytology, TG, cholesterol -extubate today  Shock: While her fever is probably due to her CLL/lymphoma, possible septic component of shock but unclear source - ?colitis/gastroenteritis but cx unrevealing, not convinced she every had pneumonia, UA bland. TTE and CTA suggestive of PH however coox not c/w right heart failure. Still don't fully understand her shock state.  P: -check AM cortisol tomorrow. -Continue cefepime/flagyl for possible intraabdominal source, follow cultures and narrow as able - can dc once all cultures finalized -Wean levo for MAP>65 -F/u BC x2, expectorated sputum, left and right pleural fluid cultures  Chronic diastolic heart failure Possible pulmonary hypertension P: -hold lisinopril -hold diuretic   Hyperleukocytosis CLL/SLL: seen by Dr. Delton Coombes outpatient and was last seen 12/2019; ED physician spoke with Dr. Delton Coombes who believes this is related to her CLL and not acute episode; does not appear to have tumor lysis syndrome. P: -oncology to see today -trend electrolytes, cbc, ldh -Continue broad-spectrum antibiotics; follow cultures  Elevated troponin suspect demand  Hyperglycemia P: -Check A1c -SSI and CBG monitoring  Abdominal Pain:  likely due to adenopathy P: -CTM  HTN HLD P: -hold home lisinopril in setting of hypotension -no home statin listed  Depression/anxiety P: -no home meds listed   Best Practice (right click and "Reselect all SmartList Selections" daily)   Diet/type: NPO w/ meds via tube DVT prophylaxis: LMWH GI prophylaxis: PPI Lines: N/A Foley:  N/A Code Status:  DNR Last date of multidisciplinary goals of care discussion [6/3 spoke with  daughter at bedside plan for one way extubation. ]  Critical care time: 34 minutes    [place]  02/16/2022, 9:53 AM  Please see [email or web address] for pager details.  From 7A-7P if no response, please call [phone]. After hours, please call ELink [phone].

## 2022-02-16 NOTE — Progress Notes (Signed)
PHARMACY NOTE:  ANTIMICROBIAL RENAL DOSAGE ADJUSTMENT  Current antimicrobial regimen includes a mismatch between antimicrobial dosage and estimated renal function.  As per policy approved by the Pharmacy & Therapeutics and Medical Executive Committees, the antimicrobial dosage will be adjusted accordingly.  Current antimicrobial dosage:  cefepime 2g q8h  Indication: sepsis  Renal Function:  Estimated Creatinine Clearance: 52.2 mL/min (by C-G formula based on SCr of 0.9 mg/dL).    Antimicrobial dosage has been changed to:  cefepime 2g q12h  Additional comments:   Thank you for allowing pharmacy to be a part of this patient's care.  Cristela Felt, PharmD, BCPS Clinical Pharmacist 02/16/2022 8:16 AM

## 2022-02-16 NOTE — Progress Notes (Signed)
eLink Physician-Brief Progress Note Patient Name: [name] DOB: 1960-10-25 MRN: [id number]   Date of Service  02/16/2022  HPI/Events of Note  Was extubated earlier. Passed swallow eval. Can we advance diet? CBGs trending down. Want to advance diet to give her something to eat to help CBGs no hx of DM.    Camera: On nasal o2, with stable Vitals.    eICU Interventions  Soft diet as tolerated ordered.      Intervention Category Minor Interventions: Routine modifications to care plan (e.g. PRN medications for pain, fever)  Elmer Sow 02/16/2022, 11:48 PM

## 2022-02-17 ENCOUNTER — Inpatient Hospital Stay (HOSPITAL_COMMUNITY): Payer: Medicaid Other

## 2022-02-17 DIAGNOSIS — E878 Other disorders of electrolyte and fluid balance, not elsewhere classified: Secondary | ICD-10-CM | POA: Insufficient documentation

## 2022-02-17 DIAGNOSIS — J9601 Acute respiratory failure with hypoxia: Secondary | ICD-10-CM | POA: Diagnosis not present

## 2022-02-17 DIAGNOSIS — I5032 Chronic diastolic (congestive) heart failure: Secondary | ICD-10-CM

## 2022-02-17 DIAGNOSIS — Z66 Do not resuscitate: Secondary | ICD-10-CM | POA: Insufficient documentation

## 2022-02-17 LAB — COMPREHENSIVE METABOLIC PANEL
ALT: 20 U/L (ref 0–44)
AST: 23 U/L (ref 15–41)
Albumin: 1.9 g/dL — ABNORMAL LOW (ref 3.5–5.0)
Alkaline Phosphatase: 69 U/L (ref 38–126)
Anion gap: 8 (ref 5–15)
BUN: 26 mg/dL — ABNORMAL HIGH (ref 8–23)
CO2: 26 mmol/L (ref 22–32)
Calcium: 8.1 mg/dL — ABNORMAL LOW (ref 8.9–10.3)
Chloride: 113 mmol/L — ABNORMAL HIGH (ref 98–111)
Creatinine, Ser: 0.87 mg/dL (ref 0.44–1.00)
GFR, Estimated: >60 mL/min (ref >60)
Glucose, Bld: 73 mg/dL (ref 70–99)
Potassium: 3.7 mmol/L (ref 3.5–5.1)
Sodium: 147 mmol/L — ABNORMAL HIGH (ref 135–145)
Total Bilirubin: 1.4 mg/dL — ABNORMAL HIGH (ref 0.3–1.2)
Total Protein: 4.3 g/dL — ABNORMAL LOW (ref 6.5–8.1)

## 2022-02-17 LAB — GLUCOSE, CAPILLARY
Glucose-Capillary: 75 mg/dL (ref 70–99)
Glucose-Capillary: 69 mg/dL — ABNORMAL LOW (ref 70–99)
Glucose-Capillary: 104 mg/dL — ABNORMAL HIGH (ref 70–99)
Glucose-Capillary: 82 mg/dL (ref 70–99)
Glucose-Capillary: 294 mg/dL — ABNORMAL HIGH (ref 70–99)
Glucose-Capillary: 230 mg/dL — ABNORMAL HIGH (ref 70–99)
Glucose-Capillary: 259 mg/dL — ABNORMAL HIGH (ref 70–99)

## 2022-02-17 LAB — CBC WITH DIFFERENTIAL/PLATELET
Abs Immature Granulocytes: 0.1 10*3/uL — ABNORMAL HIGH (ref 0.00–0.07)
Basophils Absolute: 0.1 10*3/uL (ref 0.0–0.1)
Basophils Relative: 0 %
Eosinophils Absolute: 0 10*3/uL (ref 0.0–0.5)
Eosinophils Relative: 0 %
HCT: 33.8 % — ABNORMAL LOW (ref 36.0–46.0)
Hemoglobin: 10.8 g/dL — ABNORMAL LOW (ref 12.0–15.0)
Immature Granulocytes: 0 %
Lymphocytes Relative: 92 %
Lymphs Abs: 67.2 10*3/uL — ABNORMAL HIGH (ref 0.7–4.0)
MCH: 28.6 pg (ref 26.0–34.0)
MCHC: 32 g/dL (ref 30.0–36.0)
MCV: 89.7 fL (ref 80.0–100.0)
Monocytes Absolute: 0.5 10*3/uL (ref 0.1–1.0)
Monocytes Relative: 1 %
Neutro Abs: 5.3 10*3/uL (ref 1.7–7.7)
Neutrophils Relative %: 7 %
Platelets: 147 10*3/uL — ABNORMAL LOW (ref 150–400)
RBC: 3.77 MIL/uL — ABNORMAL LOW (ref 3.87–5.11)
RDW: 17.9 % — ABNORMAL HIGH (ref 11.5–15.5)
WBC: 73.1 10*3/uL (ref 4.0–10.5)
nRBC: 0 % (ref 0.0–0.2)

## 2022-02-17 LAB — CORTISOL: Cortisol, Plasma: 23.2 ug/dL

## 2022-02-17 LAB — PATHOLOGIST SMEAR REVIEW

## 2022-02-17 LAB — SURGICAL PATHOLOGY

## 2022-02-17 LAB — LACTATE DEHYDROGENASE: LDH: 164 U/L (ref 98–192)

## 2022-02-17 LAB — HAPTOGLOBIN: Haptoglobin: 182 mg/dL (ref 37–355)

## 2022-02-17 LAB — CYTOLOGY - NON PAP

## 2022-02-17 MED ORDER — INSULIN ASPART 100 UNIT/ML IJ SOLN
0.0000 [IU] | INTRAMUSCULAR | Status: DC
  Administered 2022-02-17: 3 [IU] via SUBCUTANEOUS

## 2022-02-17 MED ORDER — METRONIDAZOLE 500 MG/100ML IV SOLN
500.0000 mg | Freq: Two times a day (BID) | INTRAVENOUS | Status: AC
  Administered 2022-02-17 – 2022-02-18 (×3): 500 mg via INTRAVENOUS
  Filled 2022-02-17 (×3): qty 100

## 2022-02-17 MED ORDER — ADULT MULTIVITAMIN W/MINERALS CH
1.0000 | ORAL_TABLET | Freq: Every day | ORAL | Status: DC
  Administered 2022-02-17 – 2022-02-25 (×9): 1 via ORAL
  Filled 2022-02-17 (×9): qty 1

## 2022-02-17 MED ORDER — POLYETHYLENE GLYCOL 3350 17 G PO PACK
17.0000 g | PACK | Freq: Every day | ORAL | Status: DC
  Administered 2022-02-17 – 2022-03-21 (×21): 17 g via ORAL
  Filled 2022-02-17 (×27): qty 1

## 2022-02-17 MED ORDER — BOOST / RESOURCE BREEZE PO LIQD CUSTOM
1.0000 | Freq: Three times a day (TID) | ORAL | Status: DC
  Administered 2022-02-17 – 2022-02-24 (×15): 1 via ORAL

## 2022-02-17 MED ORDER — INSULIN ASPART 100 UNIT/ML IJ SOLN
0.0000 [IU] | Freq: Three times a day (TID) | INTRAMUSCULAR | Status: DC
  Administered 2022-02-18: 2 [IU] via SUBCUTANEOUS
  Administered 2022-02-19: 4 [IU] via SUBCUTANEOUS

## 2022-02-17 MED ORDER — MEDIUM CHAIN TRIGLYCERIDES PO OIL
5.0000 mL | TOPICAL_OIL | Freq: Three times a day (TID) | ORAL | Status: DC
  Filled 2022-02-17 (×2): qty 5

## 2022-02-17 NOTE — Progress Notes (Signed)
Initial Nutrition Assessment  DOCUMENTATION CODES:   Severe malnutrition in context of chronic illness, Underweight  INTERVENTION:   Boost Breeze po TID, each supplement provides 250 kcal, 9 grams of protein, 0 gm fat.  Carnation Breakfast Essentials mixed with skim milk TID with meals, each supplement provides 220 kcal, 13 gm protein, 1 gm fat.  Multivitamin with minerals daily.  MCT oil supplementation 5 ml TID, each serving provides 38 kcal and 4.7 gm fat. If patient tolerates 5 ml doses, recommend increase to 10 ml TID tomorrow, then 15 ml TID on Wednesday.   EFA supplementation may be required if low fat diet required for > 5 days.   RD has ordered meals through Breakfast Wednesday to ensure low fat intake.  NUTRITION DIAGNOSIS:   Severe Malnutrition related to chronic illness (CLL) as evidenced by severe muscle depletion, severe fat depletion.  GOAL:   Patient will meet greater than or equal to 90% of their needs  MONITOR:   PO intake, Supplement acceptance, Labs  REASON FOR ASSESSMENT:   Consult Assessment of nutrition requirement/status (low fat diet)  ASSESSMENT:   61 yo female admitted with AMS and unresponsiveness. PMH includes CLL, depression, anxiety, HLD, HTN, lymphadenopathy, iron deficiency.  Discussed patient in ICU rounds and with RN today. 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  Patient was extubated 6/4. Diet advanced to soft earlier today.   Patient reports that she has lost weight, but she is unsure why. She has been trying to drink milk and eat other high calorie foods at home. During RD visit, she stated that she is hungry. She was waiting on her lunch to arrive. She agreed to try Boost Breeze and [employer] (mixed with skim milk) supplements to maximize intake of protein and calories. Discussed the need for a low fat diet and that RD will adjust  meals to limit fat intake. Will add MVI daily to prevent fat soluble vitamin deficiency. Discussed with NP Laurey Arrow, RD to add MCT oil.   Chest tube output 710 ml x 24 hours; 150 ml output so far today.  Labs reviewed. Na 147 CBG: [id number]  Medications reviewed and include Colace, Novolog, Miralax.  Weight history reviewed.  Most recent weight is from 2 years ago, 64.4 kg. Current weight 48.7 kg. 24% weight loss x 2 years.   NUTRITION - FOCUSED PHYSICAL EXAM:  Flowsheet Row Most Recent Value  Orbital Region Moderate depletion  Upper Arm Region Moderate depletion  Thoracic and Lumbar Region Severe depletion  Buccal Region Severe depletion  Temple Region Severe depletion  Clavicle Bone Region Severe depletion  Clavicle and Acromion Bone Region Severe depletion  Scapular Bone Region Severe depletion  Dorsal Hand Severe depletion  Patellar Region Severe depletion  Anterior Thigh Region Severe depletion  Posterior Calf Region Mild depletion  Edema (RD Assessment) Moderate  Hair Reviewed  Eyes Reviewed  Mouth Reviewed  Skin Reviewed  Nails Reviewed       Diet Order:   Diet Order             DIET SOFT Room service appropriate? Yes; Fluid consistency: Thin  Diet effective now                   EDUCATION NEEDS:   Education needs have been addressed  Skin:  Skin Assessment: Reviewed RN Assessment  Last BM:  6/3  Height:   Ht Readings from Last 1 Encounters:  03/12/2022 '5\' 5"'$  (1.651 m)    Weight:   Wt Readings from Last 1 Encounters:  02/17/22 48.7 kg    Ideal Body Weight:  56.8 kg  BMI:  Body mass index is 17.87 kg/m.  Estimated Nutritional Needs:   Kcal:  1700-1900  Protein:  70-90 gm  Fluid:  1.7-1.9 L    Lucas Mallow RD, LDN, CNSC Please refer to Amion for contact information.

## 2022-02-17 NOTE — Progress Notes (Signed)
Pt was seen at bedside to discuss lymph node biopsy. Pt states that she does not want to do lymph node biopsy while in hospital. She states that she would rather schedule the procedure as an outpatient when she is ready.     Narda Rutherford, AGNP-BC 02/17/2022, 2:40 PM

## 2022-02-17 NOTE — Progress Notes (Signed)
Pt transported to 6N 05 by this RN and Junie Panning, NT. Belongings sent with patient. Chart handed to [facility] Retail banker. Report given to Katrina RN on 6N. Chest tube canister hooked to wall suction and set to -20 cm H20 on arrival to 6N.

## 2022-02-17 NOTE — Consult Note (Signed)
Chief Complaint: Lymphadenopathy. Request is for lymph node biopsy.  Referring Physician(s): Salvadore Dom NP  Supervising Physician: Arne Cleveland  Patient Status: [facility] - In-pt  History of Present Illness: [name] is a 61 y.o. female inpatient. History of CLL. Lost to follow up. Presented to the ED at [facility] on 6.2.23 unresponsive. Found to have  hypoxia secondary to respiratory failure. Patient now extubated. CT Chest shows extensive lymphadenotomy. Team is request a lymph node core biopsy for further evaluation for possible transformation to a more aggressive process.   Currently without any significant complaints. Patient alert and laying in bed, calm and comfortable.Family at bedside. States that she is feeling better but does have a running nose that she relates to being extubation.  Denies any fevers, headache, chest pain, SOB, cough, abdominal pain, nausea, vomiting or bleeding. Return precautions and treatment recommendations and follow-up discussed with the patient  who is agreeable with the plan.    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 pylori infection 2/09   s/p prevpac   HAND PAIN, BILATERAL 01/29/2009   Qualifier: Diagnosis of  By: Moshe Cipro MD, Margaret     Hypertension    Iron deficiency 11/17/2016   Lymphadenopathy 01/17/2016   Medical non-compliance 08/22/2017   Nicotine addiction    Psychosis ([place])    social worker states that patient does not like to talk about this and will stop her meds if you try and discuss this with her.   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  Otherwise normal colon without evidence of polyps, masses, inflammatory changes, diverticular AVMs   HAMMER TOE SURGERY Left    HYSTEROSCOPY WITH D & C N/A 07/30/2016   Procedure: DILATATION AND CURETTAGE /HYSTEROSCOPY;  Surgeon: Florian Buff, MD;  Location: [facility];  Service: Gynecology;  Laterality: N/A;   Surgery of left middle finger on left hand (childhood )     TUBAL LIGATION      Allergies: Feraheme [ferumoxytol]  Medications: Prior to Admission medications   Medication Sig Start Date End Date Taking? Authorizing Provider  lisinopril (ZESTRIL) 10 MG tablet TAKE 1 TABLET BY MOUTH ONCE A DAY. Patient not taking: Reported on 02/16/2022 03/20/20   Fayrene Helper, MD  Vitamin D, Ergocalciferol, (DRISDOL) 1.25 MG (50000 UNIT) CAPS capsule Take 1 capsule (50,000 Units total) by mouth every 7 (seven) days. Patient not taking: Reported on 02/16/2022 12/15/19   Perlie Mayo, NP  fluticasone [facility]) 50 MCG/ACT nasal spray Place 1 spray into the nose daily. 07/02/11 11/27/11  Fayrene Helper, MD     Family History  Problem Relation Age of Onset   Cancer Mother    Hypertension Daughter     Social History   Socioeconomic History   Marital status: Single    Spouse name: Not on file   Number of children: 2   Years of education: Not on file  Highest education level: Not on file  Occupational History   Occupation: disabled  Tobacco Use   Smoking status: Light Smoker    Packs/day: 0.00    Years: 16.00    Pack years: 0.00    Types: Cigarettes   Smokeless tobacco: Never   Tobacco comments:    3-4 per day   Vaping Use   Vaping Use: Never used  Substance and Sexual Activity   Alcohol use: Not Currently    Alcohol/week: 0.0 standard drinks    Comment: occ   Drug use: No   Sexual activity: Not Currently    Birth  control/protection: Post-menopausal  Other Topics Concern   Not on file  Social History Narrative   Lives alone   Social Determinants of Health   Financial Resource Strain: Not on file  Food Insecurity: Not on file  Transportation Needs: Not on file  Physical Activity: Not on file  Stress: Not on file  Social Connections: Not on file    Review of Systems: A 12 point ROS discussed and pertinent positives are indicated in the HPI above.  All other systems are negative.  Review of Systems  Constitutional:  Negative for fatigue and fever.  HENT:  Positive for rhinorrhea. Negative for congestion.   Respiratory:  Negative for cough and shortness of breath.   Gastrointestinal:  Negative for abdominal pain, diarrhea, nausea and vomiting.   Vital Signs: BP 109/74   Pulse 93   Temp 99.7 F (37.6 C)   Resp (!) 36   Ht '5\' 5"'$  (1.651 m)   Wt 107 lb 5.8 oz (48.7 kg)   LMP 07/25/2016   SpO2 99%   BMI 17.87 kg/m   Physical Exam Vitals and nursing note reviewed.  Constitutional:      Appearance: She is well-developed.  HENT:     Head: Normocephalic and atraumatic.  Eyes:     Conjunctiva/sclera: Conjunctivae normal.  Cardiovascular:     Rate and Rhythm: Regular rhythm. Tachycardia present.  Pulmonary:     Effort: Pulmonary effort is normal.  Musculoskeletal:        General: Normal range of motion.     Cervical back: Normal range of motion.  Skin:    General: Skin is warm.  Neurological:     Mental Status: She is alert and oriented to person, place, and time.    Imaging: CT Head Wo Contrast  Result Date: 03/06/2022 CLINICAL DATA:  Mental status changes, unknown cause. EXAM: CT HEAD WITHOUT CONTRAST TECHNIQUE: Contiguous axial images were obtained from the base of the skull through the vertex without intravenous contrast. RADIATION DOSE REDUCTION: This exam was performed according to the departmental dose-optimization program which includes automated exposure control,  adjustment of the mA and/or kV according to patient size and/or use of iterative reconstruction technique. COMPARISON:  MRI brain 02/01/2004 report. FINDINGS: Brain: There is slight cerebral cortical atrophy with mild-to-moderate small-vessel disease of the cerebral white matter, with white matter changes only minimally progressed from 18 years ago. 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 There is a mostly calcified extra-axial parasagittal high left frontal convexity meningioma measuring 1.8 x 1.5 x 1.3 cm, previously 1.5 x 0.9 cm, mildly deforming an underlying parasagittal superior left frontal gyrus but without underlying vasogenic edema. There are aneurysm coils with interval coiling of the previously noted left MCA bifurcation aneurysm. This causes moderate streak artifact through  the adjacent temporal lobes and upper posterior fossa structures. Allowing for metallic artifacts there are no findings seen suspicious for acute cortical based infarct, hemorrhage, mass effect or midline shift. The cerebellum and brainstem are unremarkable. The ventricles are normal in size and position. Basal cisterns are clear. Vascular: There are scattered calcifications of the carotid siphons but no hyperdense central vasculature. Skull: No fracture or focal lesion. Sinuses/Orbits: There is patchy membrane thickening in the ethmoids. There is a 1 cm osteoma in the left frontal sinus. Other visualized sinuses are clear. No mastoid effusion. Other: None. IMPRESSION: 1. 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 Allowing for streak artifacts no acute intracranial process is suspected. 2. 1.8 x 1.5 x 1.3 cm mostly calcified meningioma in the parasagittal high left frontal convexity mildly deforming and underlying parasagittal superior frontal gyrus. This appears to have grown only minimally since 18 years  ago. 3. Slight atrophy with mild-to-moderate small-vessel disease with only minimal progression of the small-vessel changes , no increased atrophy in the interval. 4. 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 5. Sinus membrane disease. Electronically Signed   By: Telford Nab M.D.   On: 02/19/2022 21:21   CT Angio Chest PE W and/or Wo Contrast  Result Date: 02/15/2022 CLINICAL DATA:  Unresponsive EXAM: CT ANGIOGRAPHY CHEST WITH CONTRAST TECHNIQUE: Multidetector CT imaging of the chest was performed using the standard protocol during bolus administration of intravenous contrast. Multiplanar CT image reconstructions and MIPs were obtained to evaluate the vascular anatomy. RADIATION DOSE REDUCTION: This exam was performed according to the departmental dose-optimization program which includes automated exposure control, adjustment of the mA and/or kV according to patient size and/or use of iterative reconstruction technique. CONTRAST:  119m OMNIPAQUE IOHEXOL 350 MG/ML SOLN COMPARISON:  CT 01/06/2020, chest x-ray 02/23/2022, CT chest 02/14/2016 FINDINGS: Cardiovascular: Satisfactory opacification of the pulmonary arteries to the segmental level. No evidence of pulmonary embolism. Aorta is nonaneurysmal. Mild atherosclerosis. No dissection. Normal cardiac size. No significant pericardial effusion. Mediastinum/Nodes: Midline trachea. Endotracheal tube tip several cm above the carina. Esophageal tube tip below the diaphragm but incompletely visualized. Numerous enlarged supraclavicular and axillary nodes, poorly defined due to lack of subcutaneous fat. Negative for thyroid mass. Ill-defined soft tissue density in the mediastinum, may reflect combination of fluid and adenopathy. Lungs/Pleura: Large bilateral pleural effusions with probable passive atelectasis of the lungs. No visible pneumothorax Upper Abdomen: Findings are dictated separately. Musculoskeletal: Sternum is intact. No definite  acute osseous abnormality. Anasarca. Multiple large masses within the low axillary regions bilaterally, extending into the upper outer quadrant of the left breast. When compared to 2017, mostly fatty breast tissue was noted previously, today the breasts are diffusely infiltrated with fluid or soft tissue. This appears worse on the left side. Review of the MIP images confirms the above findings. IMPRESSION: 1. Negative for acute pulmonary embolus. 2. Large bilateral pleural effusions with probable passive atelectasis in the lungs. 3. Lack of subcutaneous fat limits the exam. Extensive supraclavicular and lower neck adenopathy. Bilateral axillary adenopathy. Multiple large left greater than right axillary/chest wall masses, extending into the upper outer quadrants of the breasts, presumably representing adenopathy however the breasts today appear diffusely infiltrated with either edema or soft tissue/mass, recommend correlation with physical exam. Collectively the findings are suspect for lymphoma or metastatic disease. 4. Anasarca Aortic Atherosclerosis (ICD10-I70.0). Electronically Signed   By: KDonavan FoilM.D.   On: 02/21/2022 21:11   CT ABDOMEN PELVIS W  CONTRAST  Result Date: 02/19/2022 CLINICAL DATA:  Chronic lymphocytic leukemia, history lymphoma with increasing lower extremity swelling, weight loss. Called EMS initially for abdominal pain and became unresponsive with EMS. EXAM: CT ABDOMEN AND PELVIS WITH CONTRAST TECHNIQUE: Multidetector CT imaging of the abdomen and pelvis was performed using the standard protocol following bolus administration of intravenous contrast. RADIATION DOSE REDUCTION: This exam was performed according to the departmental dose-optimization program which includes automated exposure control, adjustment of the mA and/or kV according to patient size and/or use of iterative reconstruction technique. CONTRAST:  145m OMNIPAQUE IOHEXOL 350 MG/ML SOLN COMPARISON:  CT abdomen and pelvis  with contrast 01/06/2020 and 06/25/2016, also CTA chest earlier today. FINDINGS: Lower chest: Large bilateral pleural effusions are again noted, on the left depressing the diaphragm and slightly shifting the mediastinum to the right with compressive collapse/consolidation of the lower lobes and portions of the upper and right middle lobes. The cardiac size is normal. Multiple bulky masses in the low axillary regions on the left-greater-than-right are again shown. Largest visible mass on the left is 5.5 x 3.9 cm largest visible mass on the right is 3.9 x 2.3 cm. There are enlarged subcarinal and right hilar nodes partially visible mildly prominent left hilar nodes. There are multiple enlarged retrocrural lymph nodes largest of these is to the right measuring 2.5 x 1.2 cm (series 5 axial 26). Hepatobiliary: Status post cholecystectomy. No biliary dilatation is seen. There is intrahepatic periportal edema which is probably either due to fluid overload or hepatic dysfunction in this case given the interval marked weight loss. There is chronic 1.3 cm subcapsular hypodensity in the left lobe of the liver along side the gallbladder fossa unchanged and probably a small hemangioma or complex cyst. In the anterior segment of the right lobe of the liver on 5:44 there is a small flash filling hemangioma which was seen previously and unchanged. No hepatic mass enhancement is seen. The portal vein is patent within normal caliber limits. Pancreas: there are scattered coarse chronic calcifications in the pancreas consistent with chronic calcific pancreatitis but no findings of acute pancreatitis, mass or ductal dilatation. Spleen:There is heterogeneous splenic enhancement. There could be multiple small hypodense lesions within the spleen or the heterogeneity could be due to bolus phase phenomenon but the spleen is not enlarged for size. Adrenals/Urinary Tract: No adrenal or renal cortical mass enhancement. There is a 1.5 cm cyst of  the posterior lower right kidney additional scattered bilateral subcentimeter hypodensities which are too small to characterize. There is no urinary stone or obstruction. The bladder is catheterized contracted and not well seen. Stomach/Bowel: NGT is coiled in the stomach with the tip in the body of the stomach. There are thickened folds in the stomach and small bowel no small bowel obstruction or gross inflammatory reaction. An appendix is not seen. There is moderate stool retention transverse and descending colon. Thickening of the ascending colon is noted versus underdistention. Vascular/Lymphatic: There is moderate to heavy aortoiliac calcification without aneurysm. The portal vein splenic vein and SMV are patent. There is extensive retroperitoneal adenopathy including in the periportal portacaval and retrocaval spaces with portacaval nodes up to 1.6 cm in short axis periportal nodes up to 1.8 cm in short axis and large confluent mantle of adenopathy extending along the retroperitoneum into in the kidneys lifting the aorta anteriorly encasing the aorta and IVC and continuing inferiorly along the pelvic sidewalls with additional bulky adenopathy in both sidewalls and both inguinal areas. The mantle of adenopathy  extends from the level of the SMA inferior to the aortic bifurcation up to 15 cm in length and up to at least 10 cm coronal and 4.7 cm AP on 5:45. There is additional presacral coccygeal confluent adenopathy which is less well-defined but measures approximately 10 by 4 cm on 5:69. Bilateral bulky pelvic sidewall lymph nodes largest in the external chains are noted largest right external iliac chain node is 4.6 x 3.4 cm on 5:74 largest on the left 5.6 by 3.7 cm on 5:73 encasement of internal and external iliac vasculature without significant vascular narrowing. There is similar bulky bilateral inguinal chain adenopathy. Reproductive: The uterus is intact but not well seen. The ovaries are obscured by  overlapping structures and adenopathy. Other: There is a small volume of abdominal and pelvic free ascites. No free air, hemorrhage or abscess is seen. There is diffuse mesenteric haziness and body wall anasarca. Interval weight loss consistent with cachexia. Musculoskeletal: There are degenerative changes in the lumbar spine but no destructive osseous process. IMPRESSION: 1. Extensive abdominopelvic and inguinal adenopathy, most likely related to lymphoma/leukemia. 2. Numerous small hypodense splenic lesions versus bolus phase phenomenon. 3. Interval weight loss with very little body fat, appearance of cachexia, diffuse anasarca and small-volume abdominal and pelvic ascites. 4. Intrahepatic periportal edema which is probably either due to fluid overload or hepatic dysfunction. The portal vein is patent within normal caliber limits. 5. Chronic calcific pancreatitis but no findings of acute pancreatitis or ductal dilatation. 6. Ascending colitis versus nondistention, remainder of the colon with constipation. 7. Diffuse small intestinal wall thickening with gastric fold thickening, could relate to gastroenteritis or hepatic dysfunction versus reactive from ascites versus congestive or infiltrating disease. 8. Large low axillary masses, with subcarinal and hilar adenopathy. 9. Large pleural effusions. 10. Aortic atherosclerosis. Electronically Signed   By: Telford Nab M.D.   On: 02/25/2022 21:54   DG CHEST PORT 1 VIEW  Result Date: 02/17/2022 CLINICAL DATA:  Pleural effusion EXAM: PORTABLE CHEST 1 VIEW COMPARISON:  February 16, 2022 FINDINGS: The heart size and mediastinal contours are stable. Right central venous line is unchanged. Previously noted endotracheal tube and nasogastric tube have been removed. Patchy consolidation of the left mid and lung base with a left pleural effusion are noted. Stable patchy consolidation of right lung base is noted. The visualized skeletal structures are stable. IMPRESSION: New  patchy consolidation of the left mid and lung base with a left pleural effusion, suspicious for pneumonia. Stable patchy consolidation of right lung base. Electronically Signed   By: Abelardo Diesel M.D.   On: 02/17/2022 06:28   DG CHEST PORT 1 VIEW  Result Date: 02/16/2022 CLINICAL DATA:  Follow-up chest tube pleural effusion. EXAM: PORTABLE CHEST 1 VIEW COMPARISON:  Portable chest yesterday at 5:03 p.m. FINDINGS: 5:06 a.m. 02/16/2022. ETT tip 4.0 cm from the carina. Right IJ line tip again in the distal SVC. NGT is well inside the stomach but neither the side-hole or tip are filmed. Basolateral right chest pigtail tube thoracostomy is again noted. There was a minimally small adjacent basolateral pneumothorax on yesterday's film which is not seen today. There is increased hazy consolidation in the right base which could be pneumonia or re-expansion edema. Faint patchy hazy opacities in the left lower lung field are less dense than yesterday with minimal left pleural fluid remaining. No significant right pleural fluid is seen. The mid and upper lungs mildly emphysematous and clear. The mediastinum is stable with aortic tortuosity and atherosclerosis. Osteopenia. IMPRESSION:  1. Chest tube in place with no visible pneumothorax. 2. Other support devices are unaltered. 3. Increased opacity in the right base which could be pneumonitis or re-expansion edema. 4. Improving opacities in the left lower lung field, minimal left pleural effusion. Electronically Signed   By: Telford Nab M.D.   On: 02/16/2022 07:13   DG CHEST PORT 1 VIEW  Result Date: 02/15/2022 CLINICAL DATA:  Chest tube placement. EXAM: PORTABLE CHEST 1 VIEW COMPARISON:  02/15/2021 and prior radiographs FINDINGS: A RIGHT thoracostomy tube is now noted with pigtail tip overlying the LOWER LATERAL RIGHT hemithorax. Near complete resolution of RIGHT pleural effusion noted with tiny RIGHT basilar pneumothorax. New airspace opacities versus atelectasis in the  mid and LOWER LEFT lung noted. A trace LEFT pleural effusion is present. RIGHT IJ central venous catheter with tip overlying the mid SVC, endotracheal tube with tip 3.5 cm above the carina and NG tube entering the stomach again noted. IMPRESSION: 1. RIGHT thoracostomy tube placement with near complete resolution of RIGHT pleural effusion. Tiny RIGHT basilar pneumothorax. 2. New airspace opacities/atelectasis in the mid and LOWER LEFT lung with trace LEFT pleural effusion. Electronically Signed   By: Margarette Canada M.D.   On: 02/15/2022 17:21   DG CHEST PORT 1 VIEW  Result Date: 02/15/2022 CLINICAL DATA:  Acute respiratory failure. Endotracheally intubated. Status post left thoracentesis. EXAM: PORTABLE CHEST 1 VIEW COMPARISON:  Prior today FINDINGS: Support lines and tubes remain in appropriate position. Small left pleural effusion has nearly completely resolved since previous study. No pneumothorax visualized. Moderate right pleural effusion shows no significant change. Heart size is stable. Diffuse interstitial infiltrates are seen, consistent with interstitial edema. IMPRESSION: Near complete resolution of left pleural effusion. No pneumothorax visualized. Stable moderate right pleural effusion, and diffuse interstitial edema. Electronically Signed   By: Marlaine Hind M.D.   On: 02/15/2022 14:03   DG CHEST PORT 1 VIEW  Result Date: 02/15/2022 CLINICAL DATA:  61 year old female central line placement. Large pleural effusions. EXAM: PORTABLE CHEST 1 VIEW COMPARISON:  CTA chest yesterday. FINDINGS: Portable AP semi upright view at 0513 hours. Right IJ approach central line placed. Tip is just below the carina at the lower SVC level. Endotracheal tube tip in good position between the clavicles and carina. Enteric tube courses to the abdomen. No pneumothorax. Left greater than right veiling pleural effusions, visible mediastinal contours, and overall lung ventilation appears stable from yesterday. IMPRESSION: 1.  Right IJ central line placed with no adverse features. 2. Otherwise satisfactory lines and tubes. 3. Stable ventilation with left greater than right pleural effusions. Electronically Signed   By: Genevie Ann M.D.   On: 02/15/2022 05:48   DG Chest Portable 1 View  Result Date: 03/05/2022 CLINICAL DATA:  Altered mental status EXAM: PORTABLE CHEST 1 VIEW COMPARISON:  01/09/2015 FINDINGS: Tip of endotracheal tube is 4.8 cm above the carina. Distal portion of enteric tube is seen in the stomach. There is moderate to large left pleural effusion some of which appears to be loculated along the lateral margin of left upper and left mid lung fields. Evaluation of left lower lung fields for infiltrates is limited by the effusion. There is small right pleural effusion. Linear density in the right mid lung fields may suggest fluid in the interlobar fissure or subsegmental atelectasis. Central pulmonary vessels are more prominent. IMPRESSION: Central pulmonary vessels are more prominent suggesting CHF. Moderate to large left pleural effusion. Small right pleural effusion. Electronically Signed   By: Royston Cowper  Rathinasamy M.D.   On: 02/17/2022 18:55   ECHOCARDIOGRAM COMPLETE  Result Date: 02/15/2022    ECHOCARDIOGRAM REPORT   Patient Name:   [name] Keeter Date of Exam: 02/15/2022 Medical Rec #:  [id number]   Height:       65.0 in Accession #:    [id number]  Weight:       117.9 lb Date of Birth:  02/21/1961   BSA:          1.580 m Patient Age:    32 years    BP:           121/82 mmHg Patient Gender: F           HR:           82 bpm. Exam Location:  Inpatient Procedure: 2D Echo STAT ECHO Indications:    dyspnea  History:        Patient has no prior history of Echocardiogram examinations.                 Sepsis; Risk Factors:Hypertension and Current Smoker.  Sonographer:    [facility] Referring Phys: [id number] Hortencia Conradi MEIER IMPRESSIONS  1. Left ventricular ejection fraction, by estimation, is >75%. The left ventricle has  hyperdynamic function. The left ventricle has no regional wall motion abnormalities. Left ventricular diastolic parameters are consistent with Grade II diastolic dysfunction (pseudonormalization). Elevated left atrial pressure.  2. Right ventricular systolic function is mildly reduced. The right ventricular size is normal. Tricuspid regurgitation signal is inadequate for assessing PA pressure.  3. A small pericardial effusion is present. Large pleural effusion in the left lateral region.  4. The mitral valve is normal in structure. No evidence of mitral valve regurgitation.  5. The aortic valve is tricuspid. There is mild thickening of the aortic valve. Aortic valve regurgitation is not visualized. Aortic valve sclerosis is present, with no evidence of aortic valve stenosis.  6. The inferior vena cava is dilated in size with <50% respiratory variability, suggesting right atrial pressure of 15 mmHg. FINDINGS  Left Ventricle: Left ventricular ejection fraction, by estimation, is >75%. The left ventricle has hyperdynamic function. The left ventricle has no regional wall motion abnormalities. The left ventricular internal cavity size was normal in size. There is no left ventricular hypertrophy. Left ventricular diastolic parameters are consistent with Grade II diastolic dysfunction (pseudonormalization). Elevated left atrial pressure. Right Ventricle: The right ventricular size is normal. No increase in right ventricular wall thickness. Right ventricular systolic function is mildly reduced. Tricuspid regurgitation signal is inadequate for assessing PA pressure. Left Atrium: Left atrial size was normal in size. Right Atrium: Right atrial size was normal in size. Pericardium: A small pericardial effusion is present. Mitral Valve: The mitral valve is normal in structure. No evidence of mitral valve regurgitation. Tricuspid Valve: The tricuspid valve is normal in structure. Tricuspid valve regurgitation is not demonstrated.  Aortic Valve: The aortic valve is tricuspid. There is mild thickening of the aortic valve. Aortic valve regurgitation is not visualized. Aortic valve sclerosis is present, with no evidence of aortic valve stenosis. Pulmonic Valve: The pulmonic valve was normal in structure. Pulmonic valve regurgitation is trivial. Aorta: The aortic root and ascending aorta are structurally normal, with no evidence of dilitation. Venous: The inferior vena cava is dilated in size with less than 50% respiratory variability, suggesting right atrial pressure of 15 mmHg. IAS/Shunts: No atrial level shunt detected by color flow Doppler. Additional Comments: There is a large pleural effusion  in the left lateral region.  LEFT VENTRICLE PLAX 2D LVIDd:         3.20 cm   Diastology LVIDs:         1.80 cm   LV e' medial:    5.10 cm/s LV PW:         0.90 cm   LV E/e' medial:  19.2 LV IVS:        0.80 cm   LV e' lateral:   4.80 cm/s LVOT diam:     2.00 cm   LV E/e' lateral: 20.4 LV SV:         63 LV SV Index:   40 LVOT Area:     3.14 cm  RIGHT VENTRICLE            IVC RV S prime:     9.15 cm/s  IVC diam: 2.20 cm TAPSE (M-mode): 1.2 cm LEFT ATRIUM           Index        RIGHT ATRIUM           Index LA diam:      2.00 cm 1.27 cm/m   RA Area:     10.20 cm LA Vol (A4C): 18.7 ml 11.83 ml/m  RA Volume:   21.30 ml  13.48 ml/m  AORTIC VALVE LVOT Vmax:   111.00 cm/s LVOT Vmean:  71.300 cm/s LVOT VTI:    0.200 m  AORTA Ao Root diam: 3.10 cm Ao Asc diam:  2.70 cm MITRAL VALVE MV Area (PHT): 2.80 cm     SHUNTS MV Decel Time: 271 msec     Systemic VTI:  0.20 m MV E velocity: 98.00 cm/s   Systemic Diam: 2.00 cm MV A velocity: 104.00 cm/s MV E/A ratio:  0.94 Mihai Croitoru MD Electronically signed by Sanda Klein MD Signature Date/Time: 02/15/2022/9:36:44 AM    Final    [place] EKG SITE RITE  Result Date: 02/15/2022 If Site Rite image not attached, placement could not be confirmed due to current cardiac rhythm.   Labs:  CBC: Recent Labs     03/08/2022 1842 02/15/22 0319 02/15/22 0522 02/16/22 0318 02/17/22 0427  WBC 104.4* QUANTITY NOT SUFFICIENT, UNABLE TO PERFORM TEST 86.0* 95.3* 73.1*  HGB 12.9  --  12.2 12.8 10.8*  HCT 44.3  --  39.3 39.9 33.8*  PLT 306  --  262 190  181 147*    COAGS: Recent Labs    02/16/22 0318  INR 1.3*  APTT 32    BMP: Recent Labs    02/25/2022 1842 02/15/22 0153 02/15/22 0522 02/16/22 0318 02/17/22 0427  NA 144  --  142 144 147*  K 4.5  --  3.9 4.0 3.7  CL 108  --  105 110 113*  CO2 28  --  '29 27 26  '$ GLUCOSE 198*  --  164* 103* 73  BUN 24*  --  23 25* 26*  CALCIUM 9.1  --  8.4* 8.2* 8.1*  CREATININE 0.87 0.85 0.79 0.90 0.87  GFRNONAA >60 >60 >60 >60 >60    LIVER FUNCTION TESTS: Recent Labs    03/14/2022 1842 02/15/22 0522 02/16/22 0318 02/17/22 0427  BILITOT 0.5 0.8 1.0 1.4*  AST 43* 51* 31 23  ALT 27 32 25 20  ALKPHOS 135* 119 90 69  PROT 7.2 5.3* 4.8* 4.3*  ALBUMIN 4.1 3.0* 2.3* 1.9*     Assessment and Plan:  61 y.o. female inpatient. History of CLL. Lost to  follow up. Presented to the ED at [facility] on 6.2.23 unresponsive. Found to have  hypoxia secondary to respiratory failure. Patient now extubated. CT Chest shows extensive lymphadenotomy. Team is request a lymph node core biopsy for further evaluation for possible transformation to a more aggressive process.   BUN 26, WBC 73.1, Total protein 4.3. Patient is on subcutaneous prophylactic dose of lovenox. No pertinent allergies.   IR consulted for possible axillary lymph node biopsy. Case has been reviewed and procedure approved by Dr.Hassell.  Patient tentatively scheduled for 6.6.23.  Team instructed to: Keep Patient to be NPO after midnight IR will call patient when ready.  Risks and benefits of lymph node biopsy was discussed with the patient and/or patient's family including, but not limited to bleeding, infection, damage to adjacent structures or low yield requiring additional tests.  All of the questions were  answered and there is agreement to proceed.  Consent signed and in chart.   Thank you for this interesting consult.  I greatly enjoyed meeting JENNIFERLYNN SAAD and look forward to participating in their care.  A copy of this report was sent to the requesting provider on this date.  Electronically Signed: Jacqualine Mau, NP 02/17/2022, 3:29 PM   I spent a total of 40 Minutes    in face to face in clinical consultation, greater than 50% of which was counseling/coordinating care for lymph node biopsy.

## 2022-02-17 NOTE — Progress Notes (Addendum)
NAME:  [name], MRN:  [id number], DOB:  [birth year], LOS: 3 ADMISSION DATE:  02/28/2022, CONSULTATION DATE:  6/2 REFERRING MD:  Dr. Alvino Chapel, CHIEF COMPLAINT:  Unresponsive; Acute resp failure   History of Present Illness:  Patient is a 61 year old female with pertinent PMH of CLL, depression, anxiety, HLD, HTN presents to [facility] ED on 6/2 unresponsive.  On 6/2 patient called EMS for abdominal pain.  Patient initially was alert and talking but became unresponsive with AMS.  Patient was hypoxic with agonal breathing and was placed on NRB.  Patient was noted to be hypotensive.  Patient transported to [facility] ED.  Upon arrival to [facility] ED patient remained unresponsive with agonal respirations.  Patient intubated for airway protection and hypoxia.  BP initially 81/65.  Given IV fluids.  Started on levo.  Fever 100.9 F and WBC 104.4.  Started on cefepime, Flagyl, Vanco.  Cultures pending.  Glucose 198, BNP 534, troponin 109 then 112, LA 4.4 then 1.8, UA unremarkable, UDS negative.  CXR with large left pleural effusion and small right pleural effusion.  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  CT abdomen with ascending colitis constipation, small intestine wall thickening possibly related to gastroenteritis; small volume ascites; extensive abdominopelvic and inguinal adenopathy.  PCCM consulted for ICU admission and transfer to [facility].  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 pylori infection 2/09   s/p prevpac   HAND PAIN, BILATERAL 01/29/2009   Qualifier: Diagnosis of  By: Moshe Cipro MD, Margaret     Hypertension    Iron deficiency 11/17/2016    Lymphadenopathy 01/17/2016   Medical non-compliance 08/22/2017   Nicotine addiction    Psychosis ([place])    social worker states that patient does not like to talk about this and will stop her meds if you try and discuss this with her.   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 CT chest and abd: 1. Extensive abdominopelvic and inguinal adenopathy, most likely related to lymphoma/leukemia. Numerous small hypodense splenic lesions versus bolus phase phenomenon. Intrahepatic periportal edema which is probably either due to fluid overload or hepatic dysfunction. The portal vein is patent within normal caliber limits. Chronic calcific pancreatitis but no findings of acute pancreatitis or ductal dilatation.  Ascending colitis versus nondistention, remainder of the colon with constipation. Diffuse small intestinal wall thickening with gastric fold thickening, could relate to gastroenteritis or hepatic dysfunction versus reactive from ascites versus congestive or infiltrating disease. 6/3 right chest tube placed. Left thora 2000 ml removed.  6/4 extubated. ollowing commands. Lots of milky pleural fluid removed yesterday - exudative, cx negative, lymphocytic. 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 Off pressors  Interim History / Subjective:  Feels well. Wants to eat Objective   Blood pressure 104/68, pulse 91, temperature 98.8 F (37.1 C), resp. rate (Abnormal) 27, height '5\' 5"'$  (1.651 m), weight 48.7 kg, last menstrual  period 07/25/2016, SpO2 97 %.    Vent Mode: PSV;CPAP FiO2 (%):  [40 %] 40 % PEEP:  [5 cmH20] 5 cmH20 Pressure Support:  [12 cmH20] 12 cmH20   Intake/Output Summary (Last 24 hours) at 02/17/2022 0818 Last data filed at 02/17/2022  0800 Gross per 24 hour  Intake 1063.95 ml  Output 1620 ml  Net -556.05 ml   Filed Weights   02/15/22 0114 02/16/22 0325 02/17/22 0246  Weight: 53.5 kg 50.4 kg 48.7 kg    Examination: General cachectic 60yof resting in bed she is in no distress HENT NCAT no JVD. Does have some temporal wasting Pulm decreased on left. No accessory use. Currently 2 lpm Pcxr patchy right basilar airspace disease. Worsening/significant re-accumulation of left pleural effusion.  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 Now s/p L thora and right pigtail placement 6/3 Extubated 6/4 Plan Supplemental oxygen Mobilize  Abx as per below  CT pn right to 20 sxn F/u pleural TG F/u pleural culture  Am cxr. May need to place tube on left   Shock (resolved): etiology not clear. Treating as sepsis but no obvious source. Marland Kitchen Leukocytosi felt 2/2 her malignancy I wonder if her hypotension was sedation related. -a.. cultures negative  Cort > 20  Plan Day 4 of cefepime and flagyl. Stop at 5 d Await culture data pending   Fluid and electrolyte imbalance: hypernatremia and hyperchloremia Plan Encourage free water Am chem  Chronic diastolic heart failure (grade II), htn and HL Possible pulmonary hypertension Plan Holding Ace-I and diuretic   Hyperleukocytosis CLL/SLL: seen by Dr. Delton Coombes outpatient & now being followed by med onc here. Recent consult  note raising concern for transformation of more aggressive disease given increased size of LNs Plan Will ask surgery to see for LN BX although may ed to see if IR can do as pt does not want general anesthesia  After bx med onc recommending pulse steroids '1mg'$ /kg po daily F/u onc labs still pending  Elevated troponin suspect demand Plan Supportive care  Hyperglycemia & hypoglycemia  Hgb a1c 6.5  -has been on low side Plan Change ssi to very sensitivity    Abdominal Pain:  likely due to adenopathy Plan Cont to monitor    Depression/anxiety Plan Supportive care    Best Practice (right click and "Reselect all SmartList Selections" daily)   Diet/type: Regular consistency (see orders) DVT prophylaxis: LMWH GI prophylaxis: N/A Lines: N/A Foley:  N/A Code Status:  DNR Last date of multidisciplinary goals of care discussion [6/3 spoke with daughter at bedside plan for one way extubation. ]  Critical care time: NA  Erick Colace ACNP-BC [place] Pager # [phone] OR # [phone] if no answer

## 2022-02-17 NOTE — Consult Note (Signed)
[name] 22-Oct-1960  [id number].    Requesting MD: Dr. Narda Rutherford Chief Complaint/Reason for Consult: excisional lymph node biopsy  HPI:  This is a 61 yo female (poor historian) with a history of CLL who was followed by Dr. Delton Coombes at [facility].  She was last seen in 2021 with observation, but has since been lost to follow up.  She was admitted here on 6/2 secondary to hypoxia and respiratory failure.  She was found to have bilateral pleural effusions requiring chest tube placements as well as intubation.  She was able to be extubated yesterday.  She is currently a DNR/DNI with her extubation yesterday being a one way extubation.  On her initial CT of her chest on admission, she was also noted to have significant lymphadenopathy in her axilla as well as her chest wall/breast area, and abdominipelvic/inguinal regions.  Request for excisional lymph node biopsy has been request by oncology to further determine current states of malignancy.     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 pylori infection 2/09   s/p prevpac   HAND PAIN, BILATERAL 01/29/2009   Qualifier: Diagnosis of  By: Moshe Cipro MD, Margaret     Hypertension    Iron deficiency 11/17/2016   Lymphadenopathy 01/17/2016   Medical non-compliance 08/22/2017   Nicotine addiction    Psychosis ([place])    social worker states that patient does not like to talk about this and will stop her meds if you try and discuss this with her.   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  Otherwise normal colon without evidence of polyps, masses, inflammatory changes, diverticular AVMs   HAMMER TOE SURGERY Left    HYSTEROSCOPY WITH D & C N/A 07/30/2016   Procedure: DILATATION AND CURETTAGE /HYSTEROSCOPY;  Surgeon: Florian Buff, MD;  Location: [facility];  Service: Gynecology;  Laterality: N/A;   Surgery of left middle finger on left hand (childhood )     TUBAL LIGATION      Social History:  reports that she has been smoking cigarettes. She has never used smokeless tobacco. She reports that she does not currently use alcohol. She reports that she does not use drugs.  Allergies:  Allergies  Allergen Reactions   Feraheme [Ferumoxytol] Shortness Of Breath and Swelling    Medications Prior to Admission  Medication Sig Dispense Refill   lisinopril (ZESTRIL) 10 MG tablet TAKE 1 TABLET BY MOUTH ONCE A DAY. (Patient not taking: Reported on 02/16/2022) 30 tablet 0   Vitamin D, Ergocalciferol, (DRISDOL) 1.25 MG (50000 UNIT) CAPS capsule Take 1 capsule (50,000 Units total) by mouth every 7 (seven) days. (Patient not taking: Reported on 02/16/2022) 12 capsule 1     Physical Exam: Blood pressure 104/68, pulse  91, temperature 98.8 F (37.1 C), resp. rate (!) 27, height $RemoveBe'5\' 5"'iexthGLGf$  (1.651 m), weight 48.7 kg, last menstrual period 07/25/2016, SpO2 97 %. General: chronically ill, frail appearing female who is laying in bed in NAD HEENT: head is normocephalic, atraumatic.  Sclera are noninjected.  PERRL.  Ears and nose without any masses or lesions, O2 in place.  Mouth is pink and moist Lymph: enlarged lymph nodes noted throughout in axilla, inguinal regions bilaterally as well as masses palpable in left breast in outer quadrants. Psych: Alert and oriented with an appropriate  affect.   Results for orders placed or performed during the hospital encounter of 02/28/2022 (from the past 48 hour(s))  Cooxemetry Panel (carboxy, met, total hgb, O2 sat)     Status: Abnormal   Collection Time: 02/15/22  9:29 AM  Result Value Ref Range   Total hemoglobin 12.8 12.0 - 16.0 g/dL   O2 Saturation 74.7 %   Carboxyhemoglobin 1.6 (H) 0.5 - 1.5 %   Methemoglobin 0.8 0.0 - 1.5 %    Comment: Performed at [facility] [address] [place], [place] [address]  Glucose, capillary     Status: None   Collection Time: 02/15/22 12:11 PM  Result Value Ref Range   Glucose-Capillary 72 70 - 99 mg/dL    Comment: Glucose reference range applies only to samples taken after fasting for at least 8 hours.  Lactate dehydrogenase (pleural or peritoneal fluid)     Status: Abnormal   Collection Time: 02/15/22  1:45 PM  Result Value Ref Range   LD, Fluid 131 (H) 3 - 23 U/L    Comment: (NOTE) Results should be evaluated in conjunction with serum values    Fluid Type-FLDH Pleural, L     Comment: Performed at [facility] [address], [place], [place] [address]  Protein, pleural or peritoneal fluid     Status: None   Collection Time: 02/15/22  1:45 PM  Result Value Ref Range   Total protein, fluid 3.9 g/dL    Comment: POST-ULTRACENTRIFUGATION (NOTE) No normal range established for this test Results should be evaluated in conjunction with serum values    Fluid Type-FTP Pleural, L     Comment: Performed at [facility] [address] [place], [place] [address]  Glucose, pleural or peritoneal fluid     Status: None   Collection Time: 02/15/22  1:45 PM  Result Value Ref Range   Glucose, Fluid 137 mg/dL    Comment: (NOTE) No normal range established for this test Results should be evaluated in conjunction with serum values    Fluid Type-FGLU Pleural, L     Comment: Performed at [facility] [address], [place], [place] [address]  Body fluid culture w Gram  Stain     Status: None (Preliminary result)   Collection Time: 02/15/22  1:45 PM   Specimen: Pleura; Body Fluid  Result Value Ref Range   Specimen Description PLEURAL    Special Requests NONE    Gram Stain      FEW WBC PRESENT, PREDOMINANTLY MONONUCLEAR NO ORGANISMS SEEN    Culture      NO GROWTH < 24 HOURS Performed at [facility], [address] [place], [place] [address]    Report Status PENDING   Body fluid cell count with differential     Status: Abnormal   Collection Time: 02/15/22  1:45 PM  Result Value Ref Range   Fluid  Type-FCT PLEURAL FLD    Color, Fluid MILKY (A) YELLOW   Appearance, Fluid TURBID (A) CLEAR   Total Nucleated Cell Count, Fluid 875 0 - 1,000 cu mm   Neutrophil Count, Fluid 3 0 - 25 %   Lymphs, Fluid 92 %   Monocyte-Macrophage-Serous Fluid 5 (L) 50 - 90 %   Eos, Fluid 0 %    Comment: Performed at [facility] [address] [place], [place] [address]  Gram stain     Status: None   Collection Time: 02/15/22  4:30 PM   Specimen: Pleura  Result Value Ref Range   Specimen Description PLEURAL FLUID RIGHT LUNG    Special Requests NONE    Gram Stain      NO SQUAMOUS EPITHELIAL CELLS SEEN FEW WBC SEEN NO ORGANISMS SEEN Performed at [facility], [place] [address] [place], [place] [address]    Report Status 02/15/2022 FINAL   Glucose, capillary     Status: None   Collection Time: 02/15/22  4:55 PM  Result Value Ref Range   Glucose-Capillary 96 70 - 99 mg/dL    Comment: Glucose reference range applies only to samples taken after fasting for at least 8 hours.  Glucose, capillary     Status: None   Collection Time: 02/15/22  7:39 PM  Result Value Ref Range   Glucose-Capillary 76 70 - 99 mg/dL    Comment: Glucose reference range applies only to samples taken after fasting for at least 8 hours.  Glucose, capillary     Status: None   Collection Time: 02/15/22 11:37 PM  Result Value Ref Range   Glucose-Capillary 94 70 - 99 mg/dL     Comment: Glucose reference range applies only to samples taken after fasting for at least 8 hours.  Glucose, capillary     Status: None   Collection Time: 02/16/22  3:17 AM  Result Value Ref Range   Glucose-Capillary 97 70 - 99 mg/dL    Comment: Glucose reference range applies only to samples taken after fasting for at least 8 hours.  Lipid panel     Status: Abnormal   Collection Time: 02/16/22  3:18 AM  Result Value Ref Range   Cholesterol 122 0 - 200 mg/dL   Triglycerides 106 <150 mg/dL   HDL 19 (L) >40 mg/dL   Total CHOL/HDL Ratio 6.4 RATIO   VLDL 21 0 - 40 mg/dL   LDL Cholesterol 82 0 - 99 mg/dL    Comment:        Total Cholesterol/HDL:CHD Risk Coronary Heart Disease Risk Table                     Men   Women  1/2 Average Risk   3.4   3.3  Average Risk       5.0   4.4  2 X Average Risk   9.6   7.1  3 X Average Risk  23.4   11.0        Use the calculated Patient Ratio above and the CHD Risk Table to determine the patient's CHD Risk.        ATP III CLASSIFICATION (LDL):  <100     mg/dL   Optimal  100-129  mg/dL   Near or Above                    Optimal  130-159  mg/dL   Borderline  160-189  mg/dL   High  >  190     mg/dL   Very High Performed at [facility], [place] [address] [place], [place] [address]   Comprehensive metabolic panel     Status: Abnormal   Collection Time: 02/16/22  3:18 AM  Result Value Ref Range   Sodium 144 135 - 145 mmol/L   Potassium 4.0 3.5 - 5.1 mmol/L   Chloride 110 98 - 111 mmol/L   CO2 27 22 - 32 mmol/L   Glucose, Bld 103 (H) 70 - 99 mg/dL    Comment: Glucose reference range applies only to samples taken after fasting for at least 8 hours.   BUN 25 (H) 8 - 23 mg/dL   Creatinine, Ser 0.90 0.44 - 1.00 mg/dL   Calcium 8.2 (L) 8.9 - 10.3 mg/dL   Total Protein 4.8 (L) 6.5 - 8.1 g/dL   Albumin 2.3 (L) 3.5 - 5.0 g/dL   AST 31 15 - 41 U/L   ALT 25 0 - 44 U/L   Alkaline Phosphatase 90 38 - 126 U/L   Total Bilirubin 1.0 0.3 - 1.2 mg/dL    GFR, Estimated >60 >60 mL/min    Comment: (NOTE) Calculated using the CKD-EPI Creatinine Equation (2021)    Anion gap 7 5 - 15    Comment: Performed at [facility], [place] [address] [place], [place] [address]  CBC with Differential/Platelet     Status: Abnormal   Collection Time: 02/16/22  3:18 AM  Result Value Ref Range   WBC 95.3 (HH) 4.0 - 10.5 K/uL    Comment: CRITICAL VALUE NOTED.  VALUE IS CONSISTENT WITH PREVIOUSLY REPORTED AND CALLED VALUE. REPEATED TO VERIFY    RBC 4.46 3.87 - 5.11 MIL/uL   Hemoglobin 12.8 12.0 - 15.0 g/dL   HCT 39.9 36.0 - 46.0 %   MCV 89.5 80.0 - 100.0 fL   MCH 28.7 26.0 - 34.0 pg   MCHC 32.1 30.0 - 36.0 g/dL   RDW 18.1 (H) 11.5 - 15.5 %   Platelets 190 150 - 400 K/uL   nRBC 0.0 0.0 - 0.2 %   Neutrophils Relative % 12 %   Neutro Abs 11.4 (H) 1.7 - 7.7 K/uL   Lymphocytes Relative 87 %   Lymphs Abs 82.9 (H) 0.7 - 4.0 K/uL   Monocytes Relative 0 %   Monocytes Absolute 0.0 (L) 0.1 - 1.0 K/uL   Eosinophils Relative 0 %   Eosinophils Absolute 0.0 0.0 - 0.5 K/uL   Basophils Relative 1 %   Basophils Absolute 1.0 (H) 0.0 - 0.1 K/uL   WBC Morphology ABSOLUTE LYMPHOCYTOSIS     Comment: PATHOLOGIST INTERP PENDING   Smear Review MORPHOLOGY UNREMARKABLE    Abs Immature Granulocytes 0.00 0.00 - 0.07 K/uL   Burr Cells PRESENT     Comment: Performed at [facility], [address] [place], [place] [address]  DIC Panel Tomorrow AM 0500     Status: Abnormal   Collection Time: 02/16/22  3:18 AM  Result Value Ref Range   Prothrombin Time 16.4 (H) 11.4 - 15.2 seconds   INR 1.3 (H) 0.8 - 1.2    Comment: (NOTE) INR goal varies based on device and disease states.    aPTT 32 24 - 36 seconds   Fibrinogen 450 210 - 475 mg/dL    Comment: (NOTE) Fibrinogen results may be underestimated in patients receiving thrombolytic therapy.    D-Dimer, Quant 3.89 (H) 0.00 - 0.50 ug/mL-FEU    Comment: (NOTE) At  the manufacturer cut-off value of 0.5 g/mL FEU, this  assay has a negative predictive value of 95-100%.This assay is intended for use in conjunction with a clinical pretest probability (PTP) assessment model to exclude pulmonary embolism (PE) and deep venous thrombosis (DVT) in outpatients suspected of PE or DVT. Results should be correlated with clinical presentation.    Platelets 181 150 - 400 K/uL   Smear Review NO SCHISTOCYTES SEEN     Comment: Performed at [facility], [place] [address] [place], [place] [address]  Lactate dehydrogenase     Status: None   Collection Time: 02/16/22  3:18 AM  Result Value Ref Range   LDH 190 98 - 192 U/L    Comment: Performed at [facility], [place] [address], [place], [place] [address]  Glucose, capillary     Status: None   Collection Time: 02/16/22  7:10 AM  Result Value Ref Range   Glucose-Capillary 79 70 - 99 mg/dL    Comment: Glucose reference range applies only to samples taken after fasting for at least 8 hours.  Glucose, capillary     Status: Abnormal   Collection Time: 02/16/22 11:35 AM  Result Value Ref Range   Glucose-Capillary 101 (H) 70 - 99 mg/dL    Comment: Glucose reference range applies only to samples taken after fasting for at least 8 hours.  Glucose, capillary     Status: None   Collection Time: 02/16/22  3:11 PM  Result Value Ref Range   Glucose-Capillary 91 70 - 99 mg/dL    Comment: Glucose reference range applies only to samples taken after fasting for at least 8 hours.  Glucose, capillary     Status: None   Collection Time: 02/16/22  7:28 PM  Result Value Ref Range   Glucose-Capillary 81 70 - 99 mg/dL    Comment: Glucose reference range applies only to samples taken after fasting for at least 8 hours.  Glucose, capillary     Status: Abnormal   Collection Time: 02/16/22 11:26 PM  Result Value Ref Range   Glucose-Capillary 66 (L) 70 - 99 mg/dL    Comment: Glucose reference range applies only to samples taken after fasting for at least 8 hours.  Glucose, capillary      Status: None   Collection Time: 02/16/22 11:35 PM  Result Value Ref Range   Glucose-Capillary 73 70 - 99 mg/dL    Comment: Glucose reference range applies only to samples taken after fasting for at least 8 hours.  Glucose, capillary     Status: None   Collection Time: 02/17/22  3:17 AM  Result Value Ref Range   Glucose-Capillary 75 70 - 99 mg/dL    Comment: Glucose reference range applies only to samples taken after fasting for at least 8 hours.  Comprehensive metabolic panel     Status: Abnormal   Collection Time: 02/17/22  4:27 AM  Result Value Ref Range   Sodium 147 (H) 135 - 145 mmol/L   Potassium 3.7 3.5 - 5.1 mmol/L   Chloride 113 (H) 98 - 111 mmol/L   CO2 26 22 - 32 mmol/L   Glucose, Bld 73 70 - 99 mg/dL    Comment: Glucose reference range applies only to samples taken after fasting for at least 8 hours.   BUN 26 (H) 8 - 23 mg/dL   Creatinine, Ser 0.87 0.44 - 1.00 mg/dL   Calcium 8.1 (L) 8.9 - 10.3 mg/dL   Total Protein 4.3 (L) 6.5 -  8.1 g/dL   Albumin 1.9 (L) 3.5 - 5.0 g/dL   AST 23 15 - 41 U/L   ALT 20 0 - 44 U/L   Alkaline Phosphatase 69 38 - 126 U/L   Total Bilirubin 1.4 (H) 0.3 - 1.2 mg/dL   GFR, Estimated >60 >60 mL/min    Comment: (NOTE) Calculated using the CKD-EPI Creatinine Equation (2021)    Anion gap 8 5 - 15    Comment: Performed at [facility] [address], [place], [place] [address]  CBC with Differential/Platelet     Status: Abnormal   Collection Time: 02/17/22  4:27 AM  Result Value Ref Range   WBC 73.1 (HH) 4.0 - 10.5 K/uL    Comment: CRITICAL VALUE NOTED.  VALUE IS CONSISTENT WITH PREVIOUSLY REPORTED AND CALLED VALUE. REPEATED TO VERIFY THIS CRITICAL RESULT HAS VERIFIED AND BEEN CALLED TO [facility] BY HAYLEE HOWARD ON 06 05 2023 AT [facility], AND HAS BEEN READ BACK.     RBC 3.77 (L) 3.87 - 5.11 MIL/uL   Hemoglobin 10.8 (L) 12.0 - 15.0 g/dL   HCT 33.8 (L) 36.0 - 46.0 %   MCV 89.7 80.0 - 100.0 fL   MCH 28.6 26.0 - 34.0 pg   MCHC 32.0 30.0 -  36.0 g/dL   RDW 17.9 (H) 11.5 - 15.5 %   Platelets 147 (L) 150 - 400 K/uL   nRBC 0.0 0.0 - 0.2 %   Neutrophils Relative % 7 %   Neutro Abs 5.3 1.7 - 7.7 K/uL   Lymphocytes Relative 92 %   Lymphs Abs 67.2 (H) 0.7 - 4.0 K/uL   Monocytes Relative 1 %   Monocytes Absolute 0.5 0.1 - 1.0 K/uL   Eosinophils Relative 0 %   Eosinophils Absolute 0.0 0.0 - 0.5 K/uL   Basophils Relative 0 %   Basophils Absolute 0.1 0.0 - 0.1 K/uL   RBC Morphology MORPHOLOGY UNREMARKABLE    Smear Review MORPHOLOGY UNREMARKABLE    Immature Granulocytes 0 %   Abs Immature Granulocytes 0.10 (H) 0.00 - 0.07 K/uL   Smudge Cells PRESENT     Comment: Performed at [facility], [address] [place], [place] [address]  Lactate dehydrogenase     Status: None   Collection Time: 02/17/22  4:27 AM  Result Value Ref Range   LDH 164 98 - 192 U/L    Comment: Performed at [facility], [place] [address], [place], [place] [address]  Cortisol     Status: None   Collection Time: 02/17/22  4:27 AM  Result Value Ref Range   Cortisol, Plasma 23.2 ug/dL    Comment: (NOTE) AM    6.7 - 22.6 ug/dL PM   <10.0       ug/dL Performed at [facility] [address], [place], [place] [address]   Glucose, capillary     Status: Abnormal   Collection Time: 02/17/22  7:13 AM  Result Value Ref Range   Glucose-Capillary 69 (L) 70 - 99 mg/dL    Comment: Glucose reference range applies only to samples taken after fasting for at least 8 hours.  Glucose, capillary     Status: Abnormal   Collection Time: 02/17/22  8:17 AM  Result Value Ref Range   Glucose-Capillary 104 (H) 70 - 99 mg/dL    Comment: Glucose reference range applies only to samples taken after fasting for at least 8 hours.   DG CHEST PORT 1 VIEW  Result Date:  02/17/2022 CLINICAL DATA:  Pleural effusion EXAM: PORTABLE CHEST 1 VIEW COMPARISON:  February 16, 2022 FINDINGS: The heart size and mediastinal contours are stable. Right central venous line is unchanged.  Previously noted endotracheal tube and nasogastric tube have been removed. Patchy consolidation of the left mid and lung base with a left pleural effusion are noted. Stable patchy consolidation of right lung base is noted. The visualized skeletal structures are stable. IMPRESSION: New patchy consolidation of the left mid and lung base with a left pleural effusion, suspicious for pneumonia. Stable patchy consolidation of right lung base. Electronically Signed   By: Abelardo Diesel M.D.   On: 02/17/2022 06:28   DG CHEST PORT 1 VIEW  Result Date: 02/16/2022 CLINICAL DATA:  Follow-up chest tube pleural effusion. EXAM: PORTABLE CHEST 1 VIEW COMPARISON:  Portable chest yesterday at 5:03 p.m. FINDINGS: 5:06 a.m. 02/16/2022. ETT tip 4.0 cm from the carina. Right IJ line tip again in the distal SVC. NGT is well inside the stomach but neither the side-hole or tip are filmed. Basolateral right chest pigtail tube thoracostomy is again noted. There was a minimally small adjacent basolateral pneumothorax on yesterday's film which is not seen today. There is increased hazy consolidation in the right base which could be pneumonia or re-expansion edema. Faint patchy hazy opacities in the left lower lung field are less dense than yesterday with minimal left pleural fluid remaining. No significant right pleural fluid is seen. The mid and upper lungs mildly emphysematous and clear. The mediastinum is stable with aortic tortuosity and atherosclerosis. Osteopenia. IMPRESSION: 1. Chest tube in place with no visible pneumothorax. 2. Other support devices are unaltered. 3. Increased opacity in the right base which could be pneumonitis or re-expansion edema. 4. Improving opacities in the left lower lung field, minimal left pleural effusion. Electronically Signed   By: Telford Nab M.D.   On: 02/16/2022 07:13   DG CHEST PORT 1 VIEW  Result Date: 02/15/2022 CLINICAL DATA:  Chest tube placement. EXAM: PORTABLE CHEST 1 VIEW COMPARISON:   02/15/2021 and prior radiographs FINDINGS: A RIGHT thoracostomy tube is now noted with pigtail tip overlying the LOWER LATERAL RIGHT hemithorax. Near complete resolution of RIGHT pleural effusion noted with tiny RIGHT basilar pneumothorax. New airspace opacities versus atelectasis in the mid and LOWER LEFT lung noted. A trace LEFT pleural effusion is present. RIGHT IJ central venous catheter with tip overlying the mid SVC, endotracheal tube with tip 3.5 cm above the carina and NG tube entering the stomach again noted. IMPRESSION: 1. RIGHT thoracostomy tube placement with near complete resolution of RIGHT pleural effusion. Tiny RIGHT basilar pneumothorax. 2. New airspace opacities/atelectasis in the mid and LOWER LEFT lung with trace LEFT pleural effusion. Electronically Signed   By: Margarette Canada M.D.   On: 02/15/2022 17:21   DG CHEST PORT 1 VIEW  Result Date: 02/15/2022 CLINICAL DATA:  Acute respiratory failure. Endotracheally intubated. Status post left thoracentesis. EXAM: PORTABLE CHEST 1 VIEW COMPARISON:  Prior today FINDINGS: Support lines and tubes remain in appropriate position. Small left pleural effusion has nearly completely resolved since previous study. No pneumothorax visualized. Moderate right pleural effusion shows no significant change. Heart size is stable. Diffuse interstitial infiltrates are seen, consistent with interstitial edema. IMPRESSION: Near complete resolution of left pleural effusion. No pneumothorax visualized. Stable moderate right pleural effusion, and diffuse interstitial edema. Electronically Signed   By: Marlaine Hind M.D.   On: 02/15/2022 14:03   ECHOCARDIOGRAM COMPLETE  Result Date: 02/15/2022  ECHOCARDIOGRAM REPORT   Patient Name:   Lottie Dawson Date of Exam: 02/15/2022 Medical Rec #:  [id number]   Height:       65.0 in Accession #:    [id number]  Weight:       117.9 lb Date of Birth:  [birth year]   BSA:          1.580 m Patient Age:    14 years    BP:           121/82 mmHg  Patient Gender: F           HR:           82 bpm. Exam Location:  Inpatient Procedure: 2D Echo STAT ECHO Indications:    dyspnea  History:        Patient has no prior history of Echocardiogram examinations.                 Sepsis; Risk Factors:Hypertension and Current Smoker.  Sonographer:    [place] Referring Phys: [id number] Hortencia Conradi MEIER IMPRESSIONS  1. Left ventricular ejection fraction, by estimation, is >75%. The left ventricle has hyperdynamic function. The left ventricle has no regional wall motion abnormalities. Left ventricular diastolic parameters are consistent with Grade II diastolic dysfunction (pseudonormalization). Elevated left atrial pressure.  2. Right ventricular systolic function is mildly reduced. The right ventricular size is normal. Tricuspid regurgitation signal is inadequate for assessing PA pressure.  3. A small pericardial effusion is present. Large pleural effusion in the left lateral region.  4. The mitral valve is normal in structure. No evidence of mitral valve regurgitation.  5. The aortic valve is tricuspid. There is mild thickening of the aortic valve. Aortic valve regurgitation is not visualized. Aortic valve sclerosis is present, with no evidence of aortic valve stenosis.  6. The inferior vena cava is dilated in size with <50% respiratory variability, suggesting right atrial pressure of 15 mmHg. FINDINGS  Left Ventricle: Left ventricular ejection fraction, by estimation, is >75%. The left ventricle has hyperdynamic function. The left ventricle has no regional wall motion abnormalities. The left ventricular internal cavity size was normal in size. There is no left ventricular hypertrophy. Left ventricular diastolic parameters are consistent with Grade II diastolic dysfunction (pseudonormalization). Elevated left atrial pressure. Right Ventricle: The right ventricular size is normal. No increase in right ventricular wall thickness. Right ventricular systolic  function is mildly reduced. Tricuspid regurgitation signal is inadequate for assessing PA pressure. Left Atrium: Left atrial size was normal in size. Right Atrium: Right atrial size was normal in size. Pericardium: A small pericardial effusion is present. Mitral Valve: The mitral valve is normal in structure. No evidence of mitral valve regurgitation. Tricuspid Valve: The tricuspid valve is normal in structure. Tricuspid valve regurgitation is not demonstrated. Aortic Valve: The aortic valve is tricuspid. There is mild thickening of the aortic valve. Aortic valve regurgitation is not visualized. Aortic valve sclerosis is present, with no evidence of aortic valve stenosis. Pulmonic Valve: The pulmonic valve was normal in structure. Pulmonic valve regurgitation is trivial. Aorta: The aortic root and ascending aorta are structurally normal, with no evidence of dilitation. Venous: The inferior vena cava is dilated in size with less than 50% respiratory variability, suggesting right atrial pressure of 15 mmHg. IAS/Shunts: No atrial level shunt detected by color flow Doppler. Additional Comments: There is a large pleural effusion in the left lateral region.  LEFT VENTRICLE PLAX 2D LVIDd:  3.20 cm   Diastology LVIDs:         1.80 cm   LV e' medial:    5.10 cm/s LV PW:         0.90 cm   LV E/e' medial:  19.2 LV IVS:        0.80 cm   LV e' lateral:   4.80 cm/s LVOT diam:     2.00 cm   LV E/e' lateral: 20.4 LV SV:         63 LV SV Index:   40 LVOT Area:     3.14 cm  RIGHT VENTRICLE            IVC RV S prime:     9.15 cm/s  IVC diam: 2.20 cm TAPSE (M-mode): 1.2 cm LEFT ATRIUM           Index        RIGHT ATRIUM           Index LA diam:      2.00 cm 1.27 cm/m   RA Area:     10.20 cm LA Vol (A4C): 18.7 ml 11.83 ml/m  RA Volume:   21.30 ml  13.48 ml/m  AORTIC VALVE LVOT Vmax:   111.00 cm/s LVOT Vmean:  71.300 cm/s LVOT VTI:    0.200 m  AORTA Ao Root diam: 3.10 cm Ao Asc diam:  2.70 cm MITRAL VALVE MV Area (PHT): 2.80  cm     SHUNTS MV Decel Time: 271 msec     Systemic VTI:  0.20 m MV E velocity: 98.00 cm/s   Systemic Diam: 2.00 cm MV A velocity: 104.00 cm/s MV E/A ratio:  0.94 Mihai Croitoru MD Electronically signed by Sanda Klein MD Signature Date/Time: 02/15/2022/9:36:44 AM    Final       Assessment/Plan Diffuse lymphadenopathy in setting of CLL The patient has been seen and examined.  She does appear to have significant lymphadenopathy.  A request has been made for an excisional biopsy of an axillary node.  Unfortunately, this will require patient to be intubated in the OR.  The patient does NOT want to be put back on a ventilator but does want a biopsy.  Even though it would not be excisional, a compromise to at least some tissue for biopsy, would be IR for FNA which does not require intubation.  I have sent a message to Dr. Lorenso Courier to make him aware of the patient's desire to not be intubated.  Awaiting response currently.  I have also discussed with primary service as well.  No plans for intervention at this time from our service.  We are available as needed.   I reviewed Consultant hematology notes, hospitalist notes, last 24 h vitals and pain scores, last 48 h intake and output, last 24 h labs and trends, and last 24 h imaging results.  Henreitta Cea, [facility] 02/17/2022, 8:19 AM Please see Amion for pager number during day hours 7:00am-4:30pm or 7:00am -11:30am on weekends

## 2022-02-18 ENCOUNTER — Inpatient Hospital Stay (HOSPITAL_COMMUNITY): Payer: Medicaid Other

## 2022-02-18 DIAGNOSIS — A419 Sepsis, unspecified organism: Secondary | ICD-10-CM | POA: Diagnosis not present

## 2022-02-18 DIAGNOSIS — L8990 Pressure ulcer of unspecified site, unspecified stage: Secondary | ICD-10-CM | POA: Insufficient documentation

## 2022-02-18 DIAGNOSIS — J9601 Acute respiratory failure with hypoxia: Secondary | ICD-10-CM | POA: Diagnosis not present

## 2022-02-18 DIAGNOSIS — E43 Unspecified severe protein-calorie malnutrition: Secondary | ICD-10-CM | POA: Insufficient documentation

## 2022-02-18 LAB — CBC WITH DIFFERENTIAL/PLATELET
Abs Immature Granulocytes: 0.16 10*3/uL — ABNORMAL HIGH (ref 0.00–0.07)
Basophils Absolute: 0.1 10*3/uL (ref 0.0–0.1)
Basophils Relative: 0 %
Eosinophils Absolute: 0.1 10*3/uL (ref 0.0–0.5)
Eosinophils Relative: 0 %
HCT: 34.7 % — ABNORMAL LOW (ref 36.0–46.0)
Hemoglobin: 11.1 g/dL — ABNORMAL LOW (ref 12.0–15.0)
Immature Granulocytes: 0 %
Lymphocytes Relative: 92 %
Lymphs Abs: 74.8 10*3/uL — ABNORMAL HIGH (ref 0.7–4.0)
MCH: 28.5 pg (ref 26.0–34.0)
MCHC: 32 g/dL (ref 30.0–36.0)
MCV: 89.2 fL (ref 80.0–100.0)
Monocytes Absolute: 0.6 10*3/uL (ref 0.1–1.0)
Monocytes Relative: 1 %
Neutro Abs: 5.4 10*3/uL (ref 1.7–7.7)
Neutrophils Relative %: 7 %
Platelets: 161 10*3/uL (ref 150–400)
RBC: 3.89 MIL/uL (ref 3.87–5.11)
RDW: 17.3 % — ABNORMAL HIGH (ref 11.5–15.5)
WBC: 81.1 10*3/uL (ref 4.0–10.5)
nRBC: 0 % (ref 0.0–0.2)

## 2022-02-18 LAB — BODY FLUID CULTURE W GRAM STAIN: Culture: NO GROWTH

## 2022-02-18 LAB — TRIGLYCERIDES, BODY FLUIDS: Triglycerides, Fluid: 1213 mg/dL

## 2022-02-18 LAB — COMPREHENSIVE METABOLIC PANEL
ALT: 16 U/L (ref 0–44)
AST: 21 U/L (ref 15–41)
Albumin: 2.1 g/dL — ABNORMAL LOW (ref 3.5–5.0)
Alkaline Phosphatase: 79 U/L (ref 38–126)
Anion gap: 3 — ABNORMAL LOW (ref 5–15)
BUN: 28 mg/dL — ABNORMAL HIGH (ref 8–23)
CO2: 24 mmol/L (ref 22–32)
Calcium: 7.8 mg/dL — ABNORMAL LOW (ref 8.9–10.3)
Chloride: 113 mmol/L — ABNORMAL HIGH (ref 98–111)
Creatinine, Ser: 0.8 mg/dL (ref 0.44–1.00)
GFR, Estimated: >60 mL/min (ref >60)
Glucose, Bld: 145 mg/dL — ABNORMAL HIGH (ref 70–99)
Potassium: 3.9 mmol/L (ref 3.5–5.1)
Sodium: 140 mmol/L (ref 135–145)
Total Bilirubin: 0.5 mg/dL (ref 0.3–1.2)
Total Protein: 4.7 g/dL — ABNORMAL LOW (ref 6.5–8.1)

## 2022-02-18 LAB — LACTATE DEHYDROGENASE: LDH: 210 U/L — ABNORMAL HIGH (ref 98–192)

## 2022-02-18 LAB — GLUCOSE, CAPILLARY
Glucose-Capillary: 117 mg/dL — ABNORMAL HIGH (ref 70–99)
Glucose-Capillary: 208 mg/dL — ABNORMAL HIGH (ref 70–99)
Glucose-Capillary: 152 mg/dL — ABNORMAL HIGH (ref 70–99)

## 2022-02-18 MED ORDER — ONDANSETRON HCL 4 MG/2ML IJ SOLN
4.0000 mg | Freq: Four times a day (QID) | INTRAMUSCULAR | Status: DC | PRN
Start: 2022-02-18 — End: 2022-03-26
  Administered 2022-02-18 – 2022-03-24 (×11): 4 mg via INTRAVENOUS
  Filled 2022-02-18 (×11): qty 2

## 2022-02-18 MED ORDER — LIDOCAINE HCL (PF) 1 % IJ SOLN
INTRAMUSCULAR | Status: AC
  Filled 2022-02-18: qty 30

## 2022-02-18 MED ORDER — MIDAZOLAM HCL 2 MG/2ML IJ SOLN
INTRAMUSCULAR | Status: AC
  Filled 2022-02-18: qty 2

## 2022-02-18 MED ORDER — FENTANYL CITRATE (PF) 100 MCG/2ML IJ SOLN
INTRAMUSCULAR | Status: AC
  Filled 2022-02-18: qty 2

## 2022-02-18 MED ORDER — PREDNISONE 20 MG PO TABS
50.0000 mg | ORAL_TABLET | Freq: Every day | ORAL | Status: DC
  Administered 2022-02-18 – 2022-03-20 (×30): 50 mg via ORAL
  Filled 2022-02-18 (×30): qty 1

## 2022-02-18 NOTE — Progress Notes (Signed)
NAME:  [name], MRN:  [id number], DOB:  07-Jun-1961, LOS: 4 ADMISSION DATE:  03/08/2022, CONSULTATION DATE:  6/2 REFERRING MD:  Dr. Alvino Chapel, CHIEF COMPLAINT:  Unresponsive; Acute resp failure   History of Present Illness:  Patient is a 61 year old female with pertinent PMH of CLL, depression, anxiety, HLD, HTN presents to [facility] ED on 6/2 unresponsive.  On 6/2 patient called EMS for abdominal pain.  Patient initially was alert and talking but became unresponsive with AMS.  Patient was hypoxic with agonal breathing and was placed on NRB.  Patient was noted to be hypotensive.  Patient transported to [facility] ED.  Upon arrival to [facility] ED patient remained unresponsive with agonal respirations.  Patient intubated for airway protection and hypoxia.  BP initially 81/65.  Given IV fluids.  Started on levo.  Fever 100.9 F and WBC 104.4.  Started on cefepime, Flagyl, Vanco.  Cultures pending.  Glucose 198, BNP 534, troponin 109 then 112, LA 4.4 then 1.8, UA unremarkable, UDS negative.  CXR with large left pleural effusion and small right pleural effusion.  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  CT abdomen with ascending colitis constipation, small intestine wall thickening possibly related to gastroenteritis; small volume ascites; extensive abdominopelvic and inguinal adenopathy.  PCCM consulted for ICU admission and transfer to [facility].  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 pylori infection 2/09   s/p prevpac   HAND PAIN, BILATERAL 01/29/2009   Qualifier: Diagnosis of  By: Moshe Cipro MD, Margaret     Hypertension    Iron deficiency 11/17/2016    Lymphadenopathy 01/17/2016   Medical non-compliance 08/22/2017   Nicotine addiction    Psychosis ([place])    social worker states that patient does not like to talk about this and will stop her meds if you try and discuss this with her.   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 CT chest and abd: 1. Extensive abdominopelvic and inguinal adenopathy, most likely related to lymphoma/leukemia. Numerous small hypodense splenic lesions versus bolus phase phenomenon. Intrahepatic periportal edema which is probably either due to fluid overload or hepatic dysfunction. The portal vein is patent within normal caliber limits. Chronic calcific pancreatitis but no findings of acute pancreatitis or ductal dilatation.  Ascending colitis versus nondistention, remainder of the colon with constipation. Diffuse small intestinal wall thickening with gastric fold thickening, could relate to gastroenteritis or hepatic dysfunction versus reactive from ascites versus congestive or infiltrating disease. 6/3 right chest tube placed. Left thora 2000 ml removed.  6/4 extubated. ollowing commands. Lots of milky pleural fluid removed yesterday - exudative, cx negative, lymphocytic. 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 Off pressors 6/6 Transferred to [facility]  Interim History / Subjective:   Reports she has noticed more chest tube output associated with improved breathing  Objective   Blood pressure 96/72, pulse 88, temperature 99.2 F (37.3 C), temperature  source Oral, resp. rate 17, height '5\' 5"'$  (1.651 m), weight 48.7 kg, last menstrual period 07/25/2016, SpO2 97 %.        Intake/Output Summary (Last 24 hours) at 02/18/2022 1316 Last data filed at 02/18/2022 0710 Gross per 24  hour  Intake 645.3 ml  Output 1325 ml  Net -679.7 ml   Filed Weights   02/15/22 0114 02/16/22 0325 02/17/22 0246  Weight: 53.5 kg 50.4 kg 48.7 kg   Physical Exam: General: Thin elderly-appearing, no acute distress HENT: [place], AT Eyes: EOMI, no scleral icterus Respiratory: Diminished with air entry bilaterally.  No crackles, wheezing or rales. Left chest tube in place with chylous drainage Cardiovascular: RRR, -M/R/G, no JVD Extremities:-Edema,-tenderness Neuro: AAO x4, CNII-XII grossly intact Psych: Normal mood, normal affect   Resolved Hospital Problem list   Acute encephalopathy (prb 2/2 hypercarbia)   Assessment & Plan:  Principal Problem:   Sepsis ([place]) Active Problems:   Acute respiratory failure with hypoxia (HCC)   CLL (chronic lymphocytic leukemia) (HCC)   Shock (HCC)   Bilateral pleural effusion   Alteration in electrolyte and fluid balance   Chronic diastolic heart failure (HCC)   DNR (do not resuscitate)   Protein-calorie malnutrition, severe   Pressure injury of skin  Acute respiratory failure with hypoxia Bilateral chylothorax Probably mostly due to her effusions - 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 Now s/p L thora and right pigtail placement 6/3 with output 1L in the last 24 hours Pleural cx 6/3 neg Suspect chylothorax may be due increased oncotic pressure in setting of bulky lymphadenopathy P: Will start prednisone 1 mg/kg (50 mg) daily post IR biopsy Wean O2 for goal SpO2 >88% CT suction -20 cm H20 Routine chest tube care with flushes CXR tomorrow am Mobilize  Abx as per below  F/u pleural TG  Hyperleukocytosis CLL/SLL: seen by Dr. Delton Coombes outpatient & now being followed by med onc here. Recent consult note raising concern for transformation of more aggressive disease given increased size of LNs P: IR for lymph node biopsy today Will start prednisone 1 mg/kg (50 mg) daily F/u onc labs still pending  Shock (resolved): etiology not clear. Treating  as sepsis but no obvious source. Marland Kitchen Leukocytosi felt 2/2 her malignancy I wonder if her hypotension was sedation related.  P: Complete 5 days of antibiotics today  Chronic diastolic heart failure (grade II), htn and HL Possible pulmonary hypertension P: Holding Ace-I and diuretic   Best Practice (right click and "Reselect all SmartList Selections" daily)   Diet/type: Regular consistency (see orders) DVT prophylaxis: LMWH GI prophylaxis: N/A Lines: N/A Foley:  N/A Code Status:  DNR Last date of multidisciplinary goals of care discussion [6/3 spoke with daughter at bedside plan for one way extubation. ]  Critical care time: NA   Care Time: 38 min  Rodman Pickle, M.D. [facility] Pulmonary/Critical Care Medicine 02/18/2022 1:17 PM   See Amion for personal pager For hours between 7 PM to 7 AM, please call Elink for urgent questions

## 2022-02-18 NOTE — Progress Notes (Addendum)
PROGRESS NOTE [name]  HUD:[id number] DOB: 1961/09/11 DOA: 02/13/2022 PCP: Noreene Larsson, NP   Brief Narrative/Hospital Course:  9 yof  w/ CLL, depression, anxiety, HLD, HTN presented to [facility] ED on 6/2 unresponsive. On 6/2 patient called EMS for abdominal pain.  Patient initially was alert and talking but became unresponsive with AMS, hypoxic with agonal breathing and was placed on NRB, was noted to be hypotensive.In ED at [facility] remained unresponsive with agonal respirations and was intubated for airway protection and hypoxia.BP initially 81/65.  Given IV fluids.  Started on levo.  Fever 100.9 F and WBC 104.4.  Started on cefepime, Flagyl, Vanco.  Cultures pending.  Glucose 198, BNP 534, troponin 109 then 112, LA 4.4 then 1.8, UA unremarkable, UDS negative.  CXR with large left pleural effusion and small right pleural effusion.  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  CT abdomen with ascending colitis constipation, small intestine wall thickening possibly related to gastroenteritis; small volume ascites; extensive abdominopelvic and inguinal adenopathy.  PCCM consulted for ICU admission and transferred to [facility] 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  Has been off pressors and subsequently transferred to [facility] service 6/6   Subjective: Seen and examined this morning resting comfortably Doing well on room air afebrile overnight BP 90s soft   Assessment and Plan: Principal Problem:   Sepsis ([place]) Active Problems:   Acute respiratory failure with  hypoxia (HCC)   CLL (chronic lymphocytic leukemia) (HCC)   Shock ([place])   Bilateral pleural effusion   Alteration in electrolyte and fluid balance   Chronic diastolic heart failure (HCC)   DNR (do not resuscitate)   Protein-calorie malnutrition, severe   Pressure injury of skin   Sepsis/shock POA etiology unclear no obvious source of infection, significant leukocytosis due to her malignancy.  Blood culture and urine culture blood fluid culture negative so far on cefepime and Flagyl completing 5-day course 6/6.  BP stabilized has been off pressors.  Acute respiratory failure with hypoxia and unresponsiveness intubated initially:  Bilateral exudative pleural effusion left more than right status post chest tube placement suspected chylothorax.  Follow-up pleural fluid culture, continue chest tube management as per pulmonary  Diffuse lymphadenopathy in the setting of CHY:IFOYDXA consulted and plan is for IR biopsy.  Hematology following.  Significant leukocytosis noted.  Monitor  Chronic diastolic heart failure Hypertension: Blood pressure soft, holding lisinopril.  Not on diuretics, not in failure.  Monitor intake output Daily weight. Net IO Since Admission: -2,018.97 mL [02/18/22 1154]   Hyperlipidemia:not on emds  Abdominal pain likely lymphadenopathy related to pain management, Zofran Hyperglycemia and hypoglycemia: A1c 6.5 on SSI.  Anxiety/depression-supportive care  Protein-calorie malnutrition, severe: Augment nutritional status, dietitian following Nutrition Problem: Severe Malnutrition Etiology: chronic illness (CLL) Signs/Symptoms: severe muscle depletion, severe fat depletion Interventions: Boost Breeze, MVI, Carnation Instant Breakfast   DVT prophylaxis: enoxaparin (LOVENOX) injection 40 mg Start: 02/16/22 1515 Code Status:   Code Status: DNR Family Communication: plan of care discussed with patient at bedside. Patient status is: Inpatient because of ongoing  management Level of care: Med-Surg   Dispo: The patient is  from: Home            Anticipated disposition: TBD  Mobility Assessment (last 72 hours)     Mobility Assessment   No documentation.          Objective: Vitals last 24 hrs: Vitals:   02/17/22 2025 02/18/22 0058 02/18/22 0442 02/18/22 0732  BP: 107/73 (!) 90/59 93/65 96/72  Pulse: 98 92 89 88  Resp: _0 Temp: 99.9 F (37.7 C) 99.9 F (37.7 C) 99.7 F (37.6 C) 99.2 F (37.3 C)  TempSrc: Oral Oral Oral Oral  SpO2: 98% 91% 96% 97%  Weight:      Height:       Weight change:   Physical Examination: General exam: alert awake, oriented thin and frail older than stated age, weak appearing. HEENT:Oral mucosa moist, Ear/Nose WNL grossly, dentition normal. Respiratory system: bilaterally diminished BS, no use of accessory muscle Cardiovascular system: S1 & S2 +, No JVD. Gastrointestinal system: Abdomen soft,NT,ND, BS+ Nervous System:Alert, awake, moving extremities and grossly nonfocal Extremities: LE edema mild,distal peripheral pulses palpable.  Skin: No rashes,no icterus. MSK: Normal muscle bulk,tone, power  Medications reviewed:  Scheduled Meds:  chlorhexidine gluconate (MEDLINE KIT)  15 mL Mouth Rinse BID   docusate sodium  100 mg Oral BID   enoxaparin (LOVENOX) injection  40 mg Subcutaneous Q24H   feeding supplement  1 Container Oral TID BM   insulin aspart  0-6 Units Subcutaneous TID WC   multivitamin with minerals  1 tablet Oral Daily   polyethylene glycol  17 g Oral Daily   sodium chloride flush  10-40 mL Intracatheter Q12H   Continuous Infusions:  sodium chloride 10 mL/hr at 02/17/22 0800   ceFEPime (MAXIPIME) IV 2 g (02/18/22 0325)   metronidazole 500 mg (02/18/22 0441)   Diet Order             Diet NPO time specified Except for: Sips with Meds  Diet effective midnight                    Nutrition Problem: Severe Malnutrition Etiology: chronic illness (CLL) Signs/Symptoms:  severe muscle depletion, severe fat depletion Interventions: Boost Breeze, MVI, Carnation Instant Breakfast   Intake/Output Summary (Last 24 hours) at 02/18/2022 1154 Last data filed at 02/18/2022 0600 Gross per 24 hour  Intake 635.3 ml  Output 1375 ml  Net -739.7 ml   Net IO Since Admission: -2,018.97 mL [02/18/22 1154]  Wt Readings from Last 3 Encounters:  02/17/22 48.7 kg  03/08/20 64.4 kg  02/07/20 68.5 kg     Unresulted Labs (From admission, onward)     Start     Ordered   02/17/22 0500  IgVH Somatic Hypermutation  Tomorrow morning,   R       Question:  Specimen collection method  Answer:  Unit=Unit collect   02/16/22 1249   02/17/22 0500  ZAP-70 Panel (ASR)  Tomorrow morning,   R       Question:  Specimen collection method  Answer:  Unit=Unit collect   02/16/22 1249   02/17/22 0500  FISH HES Leukemia,4q12 REA  Tomorrow morning,   R       Question:  Specimen collection method  Answer:  Unit=Unit collect   02/16/22 1249   02/15/22 1630  Culture, body fluid w Gram Stain-bottle  Once,   R        02/15/22 1630   02/15/22 1408  Flow Cytometry, Peripheral Blood (Oncology)  ONCE -  URGENT,   URGENT       Question:  Specimen collection method  Answer:  Unit=Unit collect   02/15/22 1407   02/15/22 1345  Cholesterol, body fluid  Once,   R        02/15/22 1345   02/15/22 1345  Triglycerides, Body Fluid  Once,   R        02/15/22 1345          Data Reviewed: I have personally reviewed following labs and imaging studies CBC: Recent Labs  Lab 02/23/2022 1842 02/15/22 0319 02/15/22 0522 02/16/22 0318 02/17/22 0427 02/18/22 0056  WBC 104.4* QUANTITY NOT SUFFICIENT, UNABLE TO PERFORM TEST 86.0* 95.3* 73.1* 81.1*  NEUTROABS 4.9  --   --  11.4* 5.3 5.4  HGB 12.9  --  12.2 12.8 10.8* 11.1*  HCT 44.3  --  39.3 39.9 33.8* 34.7*  MCV 96.3  --  91.0 89.5 89.7 89.2  PLT 306  --  262 190  181 147* 465   Basic Metabolic Panel: Recent Labs  Lab 02/26/2022 1842 02/15/22 0153  02/15/22 0319 02/15/22 0522 02/16/22 0318 02/17/22 0427 02/18/22 0056  NA 144  --   --  142 144 147* 140  K 4.5  --   --  3.9 4.0 3.7 3.9  CL 108  --   --  105 110 113* 113*  CO2 28  --   --  _0 GLUCOSE 198*  --   --  164* 103* 73 145*  BUN 24*  --   --  23 25* 26* 28*  CREATININE 0.87 0.85  --  0.79 0.90 0.87 0.80  CALCIUM 9.1  --   --  8.4* 8.2* 8.1* 7.8*  MG  --   --  2.1  --   --   --   --   PHOS  --  4.3  --   --   --   --   --    GFR: Estimated Creatinine Clearance: 56.8 mL/min (by C-G formula based on SCr of 0.8 mg/dL). Liver Function Tests: Recent Labs  Lab 02/23/2022 1842 02/15/22 0522 02/16/22 0318 02/17/22 0427 02/18/22 0056  AST 43* 51* _1 ALT 27 32 _2 ALKPHOS 135* 119 90 69 79  BILITOT 0.5 0.8 1.0 1.4* 0.5  PROT 7.2 5.3* 4.8* 4.3* 4.7*  ALBUMIN 4.1 3.0* 2.3* 1.9* 2.1*   Recent Labs  Lab 02/15/22 0153  LIPASE 22   No results for input(s): AMMONIA in the last 168 hours. Coagulation Profile: Recent Labs  Lab 02/16/22 0318  INR 1.3*   BNP (last 3 results) No results for input(s): PROBNP in the last 8760 hours. HbA1C: No results for input(s): HGBA1C in the last 72 hours. CBG: Recent Labs  Lab 02/17/22 1104 02/17/22 1514 02/17/22 1852 02/17/22 2026 02/18/22 0740  GLUCAP 82 294* 230* 259* 117*   Lipid Profile: Recent Labs    02/16/22 0318  CHOL 122  HDL 19*  LDLCALC 82  TRIG 106  CHOLHDL 6.4   Thyroid Function Tests: No results for input(s): TSH, T4TOTAL, FREET4, T3FREE, THYROIDAB in the last 72 hours. Sepsis Labs: Recent Labs  Lab 02/23/2022 1842 03/03/2022 1942 02/15/22 0153  PROCALCITON  --   --  <0.10  LATICACIDVEN 4.4* 1.8  --     Recent Results (from the past 240 hour(s))  Culture, blood (routine x 2)     Status: None (Preliminary result)   Collection  Time: 03/13/2022  7:10 PM   Specimen: BLOOD RIGHT FOREARM  Result Value Ref Range Status   Specimen Description BLOOD RIGHT FOREARM BOTTLES DRAWN AEROBIC  ONLY  Final   Special Requests   Final    Blood Culture results may not be optimal due to an inadequate volume of blood received in culture bottles   Culture   Final    NO GROWTH 4 DAYS Performed at [facility], [address] [place], [place] [address]    Report Status PENDING  Incomplete  Culture, blood (routine x 2)     Status: None (Preliminary result)   Collection Time: 03/05/2022  7:42 PM   Specimen: BLOOD RIGHT ARM  Result Value Ref Range Status   Specimen Description BLOOD RIGHT ARM BOTTLES DRAWN AEROBIC ONLY  Final   Special Requests Blood Culture adequate volume  Final   Culture   Final    NO GROWTH 4 DAYS Performed at [facility], [address], [place], [place] [address]    Report Status PENDING  Incomplete  MRSA Next Gen by PCR, Nasal     Status: None   Collection Time: 02/15/22  1:20 AM   Specimen: Nasal Mucosa; Nasal Swab  Result Value Ref Range Status   MRSA by PCR Next Gen NOT DETECTED NOT DETECTED Final    Comment: (NOTE) The GeneXpert MRSA Assay (FDA approved for NASAL specimens only), is one component of a comprehensive MRSA colonization surveillance program. It is not intended to diagnose MRSA infection nor to guide or monitor treatment for MRSA infections. Test performance is not FDA approved in patients less than 58 years old. Performed at [facility], [place] [address], [place], [place] [address]   Urine Culture     Status: None   Collection Time: 02/15/22  1:53 AM   Specimen: Urine, Clean Catch  Result Value Ref Range Status   Specimen Description URINE, CLEAN CATCH  Final   Special Requests NONE  Final   Culture   Final    NO GROWTH Performed at [facility], [place] [address], [place], [place] [address]    Report Status 02/16/2022 FINAL  Final  Body fluid culture w Gram Stain     Status: None   Collection Time: 02/15/22  1:45 PM   Specimen: Pleura; Body Fluid  Result Value Ref Range Status   Specimen Description PLEURAL  Final    Special Requests NONE  Final   Gram Stain   Final    FEW WBC PRESENT, PREDOMINANTLY MONONUCLEAR NO ORGANISMS SEEN    Culture   Final    NO GROWTH 3 DAYS Performed at [facility], [address] [place], [place] [address]    Report Status 02/18/2022 FINAL  Final  Gram stain     Status: None   Collection Time: 02/15/22  4:30 PM   Specimen: Pleura  Result Value Ref Range Status   Specimen Description PLEURAL FLUID RIGHT LUNG  Final   Special Requests NONE  Final   Gram Stain   Final    NO SQUAMOUS EPITHELIAL CELLS SEEN FEW WBC SEEN NO ORGANISMS SEEN Performed at [facility], [place] [address] [place], [place] [address]    Report Status 02/15/2022 FINAL  Final    Antimicrobials: Anti-infectives (From admission, onward)    Start     Dose/Rate Route Frequency Ordered Stop   02/17/22 1600  metroNIDAZOLE (FLAGYL) IVPB 500 mg  500 mg 100 mL/hr over 60 Minutes Intravenous Every 12 hours 02/17/22 0713 02/18/22 2359   02/16/22 1600  ceFEPIme (MAXIPIME) 2 g in sodium chloride 0.9 % 100 mL IVPB        2 g 200 mL/hr over 30 Minutes Intravenous Every 12 hours 02/16/22 0818 02/18/22 2359   02/16/22 1500  metroNIDAZOLE (FLAGYL) IVPB 500 mg  Status:  Discontinued        500 mg 100 mL/hr over 60 Minutes Intravenous Every 12 hours 02/16/22 1404 02/17/22 0711   02/15/22 1800  vancomycin (VANCOREADY) IVPB 1250 mg/250 mL  Status:  Discontinued        1,250 mg 166.7 mL/hr over 90 Minutes Intravenous Every 24 hours 03/08/2022 1955 02/15/22 0737   02/15/22 1415  metroNIDAZOLE (FLAGYL) IVPB 500 mg  Status:  Discontinued        500 mg 100 mL/hr over 60 Minutes Intravenous Every 12 hours 02/15/22 1325 02/16/22 0840   02/15/22 0400  ceFEPIme (MAXIPIME) 2 g in sodium chloride 0.9 % 100 mL IVPB  Status:  Discontinued        2 g 200 mL/hr over 30 Minutes Intravenous Every 8 hours 02/22/2022 1955 02/16/22 0817   02/19/2022 2000  vancomycin (VANCOREADY) IVPB 1250 mg/250 mL  Status:   Discontinued        1,250 mg 166.7 mL/hr over 90 Minutes Intravenous Every 24 hours 03/06/2022 1952 02/21/2022 1955   03/14/2022 1945  ceFEPIme (MAXIPIME) 2 g in sodium chloride 0.9 % 100 mL IVPB        2 g 200 mL/hr over 30 Minutes Intravenous  Once 03/01/2022 1931 02/19/2022 2051   02/27/2022 1945  metroNIDAZOLE (FLAGYL) IVPB 500 mg        500 mg 100 mL/hr over 60 Minutes Intravenous  Once 03/07/2022 1931 02/18/2022 2053   02/27/2022 1945  vancomycin (VANCOCIN) IVPB 1000 mg/200 mL premix  Status:  Discontinued        1,000 mg 200 mL/hr over 60 Minutes Intravenous  Once 03/02/2022 1931 02/28/2022 2053      Culture/Microbiology    Component Value Date/Time   SDES PLEURAL FLUID RIGHT LUNG 02/15/2022 1630   SPECREQUEST NONE 02/15/2022 1630   CULT  02/15/2022 1345    NO GROWTH 3 DAYS Performed at [facility], [place] [address], [place], [place] [address]    REPTSTATUS 02/15/2022 FINAL 02/15/2022 1630    Other culture-see note  Radiology Studies: DG CHEST PORT 1 VIEW  Result Date: 02/17/2022 CLINICAL DATA:  Pleural effusion EXAM: PORTABLE CHEST 1 VIEW COMPARISON:  February 16, 2022 FINDINGS: The heart size and mediastinal contours are stable. Right central venous line is unchanged. Previously noted endotracheal tube and nasogastric tube have been removed. Patchy consolidation of the left mid and lung base with a left pleural effusion are noted. Stable patchy consolidation of right lung base is noted. The visualized skeletal structures are stable. IMPRESSION: New patchy consolidation of the left mid and lung base with a left pleural effusion, suspicious for pneumonia. Stable patchy consolidation of right lung base. Electronically Signed   By: Abelardo Diesel M.D.   On: 02/17/2022 06:28     LOS: 4 days   Antonieta Pert, MD [facility]  02/18/2022, 11:54 AM

## 2022-02-18 NOTE — Procedures (Signed)
Interventional Radiology Procedure Note  Procedure: Ultrasound guided right axillary lymph node biopsy  Findings: Please refer to procedural dictation for full description.18 ga core x 4, samples placed in saline and formalin.    Complications: None immediate  Estimated Blood Loss:  < 5 mL  Recommendations: Follow up Pathology results.    Ruthann Cancer, MD Pager: [phone]

## 2022-02-18 NOTE — Hospital Course (Addendum)
Mrs. Hanford is a 61 y.o. F with CLL and HTN who presented to [facility] on 6/2 with increased work of breathing.  In the ER, respiratory status worsened, she became hypoxic, altered and was intubated.  CT imaging showed bilateral pleural effusions and bulky diffuse lymphadenopathy, and she was transferred to [facility].      6/2: Admitted, intubated in ER, in shock, transferred to [facility] ICU on pressors 6/3: Right chest tube placed. 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  6/7-8: Chest tube continues to have high output  6/10: Chest tube removed 6/12: Repeat right chest tube placement with 3.1 L drained 6/13: Transfer from [facility] to [facility] for initiation of radiotherapy 6/15: Output from chest tube remains high, octreotide started, XRT started 6/16: Started TPN  6/18: Dislodged right chest tube completely 6/19: PleurX considered but deferred due to poor candidate outpatient. Bilateral chest tubes placed due to worsening respiratory distress.  6/20: midodrine started 6/21: Growing consensus that thoracic duct embolization is required; CT surgery and IR involved in discussions for this procedure 6/22-6/24: Continues with significant output from chest tubes 6/27: Oncology started sirolimus for CLL 6/28-6/29: Chest tube output less 6/30: Lytics instilled in chest tube and ~2L output 7/3: Underwent Thoracic duct embolization by Dr. Laurence Ferrari 7/4: Very debilitated and still high output from chest tubes after TDE; Palliative Care met with family and patient and established plan for time limited trial  7/6: Pleural fibronlytic repeated, CT showed reduced size of effusions; tentative plan for Pleurodesis 7/7: Chest tube output remains too high for pleurodesis

## 2022-02-19 ENCOUNTER — Inpatient Hospital Stay (HOSPITAL_COMMUNITY): Payer: Medicaid Other

## 2022-02-19 DIAGNOSIS — I5032 Chronic diastolic (congestive) heart failure: Secondary | ICD-10-CM

## 2022-02-19 DIAGNOSIS — Z66 Do not resuscitate: Secondary | ICD-10-CM

## 2022-02-19 DIAGNOSIS — A419 Sepsis, unspecified organism: Secondary | ICD-10-CM | POA: Diagnosis not present

## 2022-02-19 DIAGNOSIS — J90 Pleural effusion, not elsewhere classified: Secondary | ICD-10-CM | POA: Diagnosis not present

## 2022-02-19 DIAGNOSIS — J9601 Acute respiratory failure with hypoxia: Secondary | ICD-10-CM | POA: Diagnosis not present

## 2022-02-19 LAB — CBC WITH DIFFERENTIAL/PLATELET
Abs Immature Granulocytes: 0 10*3/uL (ref 0.00–0.07)
Basophils Absolute: 1.7 10*3/uL — ABNORMAL HIGH (ref 0.0–0.1)
Basophils Relative: 2 %
Eosinophils Absolute: 2.5 10*3/uL — ABNORMAL HIGH (ref 0.0–0.5)
Eosinophils Relative: 3 %
HCT: 36.3 % (ref 36.0–46.0)
Hemoglobin: 11.5 g/dL — ABNORMAL LOW (ref 12.0–15.0)
Lymphocytes Relative: 88 %
Lymphs Abs: 72.7 10*3/uL — ABNORMAL HIGH (ref 0.7–4.0)
MCH: 29 pg (ref 26.0–34.0)
MCHC: 31.7 g/dL (ref 30.0–36.0)
MCV: 91.4 fL (ref 80.0–100.0)
Monocytes Absolute: 0 10*3/uL — ABNORMAL LOW (ref 0.1–1.0)
Monocytes Relative: 0 %
Neutro Abs: 5.8 10*3/uL (ref 1.7–7.7)
Neutrophils Relative %: 7 %
Platelets: 181 10*3/uL (ref 150–400)
RBC: 3.97 MIL/uL (ref 3.87–5.11)
RDW: 17.4 % — ABNORMAL HIGH (ref 11.5–15.5)
WBC: 82.6 10*3/uL (ref 4.0–10.5)
nRBC: 0 % (ref 0.0–0.2)
nRBC: 0 /100 WBC

## 2022-02-19 LAB — COMPREHENSIVE METABOLIC PANEL
ALT: 18 U/L (ref 0–44)
AST: 24 U/L (ref 15–41)
Albumin: 2 g/dL — ABNORMAL LOW (ref 3.5–5.0)
Alkaline Phosphatase: 77 U/L (ref 38–126)
Anion gap: 4 — ABNORMAL LOW (ref 5–15)
BUN: 21 mg/dL (ref 8–23)
CO2: 25 mmol/L (ref 22–32)
Calcium: 7.7 mg/dL — ABNORMAL LOW (ref 8.9–10.3)
Chloride: 106 mmol/L (ref 98–111)
Creatinine, Ser: 0.71 mg/dL (ref 0.44–1.00)
GFR, Estimated: >60 mL/min (ref >60)
Glucose, Bld: 367 mg/dL — ABNORMAL HIGH (ref 70–99)
Potassium: 4.9 mmol/L (ref 3.5–5.1)
Sodium: 135 mmol/L (ref 135–145)
Total Bilirubin: 0.3 mg/dL (ref 0.3–1.2)
Total Protein: 4.8 g/dL — ABNORMAL LOW (ref 6.5–8.1)

## 2022-02-19 LAB — CULTURE, BLOOD (ROUTINE X 2)
Culture: NO GROWTH
Culture: NO GROWTH
Special Requests: ADEQUATE

## 2022-02-19 LAB — MAGNESIUM: Magnesium: 2.1 mg/dL (ref 1.7–2.4)

## 2022-02-19 LAB — GLUCOSE, CAPILLARY
Glucose-Capillary: 340 mg/dL — ABNORMAL HIGH (ref 70–99)
Glucose-Capillary: 341 mg/dL — ABNORMAL HIGH (ref 70–99)
Glucose-Capillary: 324 mg/dL — ABNORMAL HIGH (ref 70–99)
Glucose-Capillary: 277 mg/dL — ABNORMAL HIGH (ref 70–99)

## 2022-02-19 LAB — SURGICAL PATHOLOGY

## 2022-02-19 MED ORDER — INSULIN ASPART 100 UNIT/ML IJ SOLN
0.0000 [IU] | Freq: Three times a day (TID) | INTRAMUSCULAR | Status: DC
  Administered 2022-02-19 (×2): 7 [IU] via SUBCUTANEOUS
  Administered 2022-02-20: 1 [IU] via SUBCUTANEOUS
  Administered 2022-02-20: 2 [IU] via SUBCUTANEOUS
  Administered 2022-02-20: 1 [IU] via SUBCUTANEOUS
  Administered 2022-02-21: 3 [IU] via SUBCUTANEOUS
  Administered 2022-02-21: 2 [IU] via SUBCUTANEOUS
  Administered 2022-02-22: 1 [IU] via SUBCUTANEOUS
  Administered 2022-02-22 (×2): 2 [IU] via SUBCUTANEOUS
  Administered 2022-02-23: 9 [IU] via SUBCUTANEOUS
  Administered 2022-02-23 – 2022-02-24 (×3): 1 [IU] via SUBCUTANEOUS
  Administered 2022-02-24 (×2): 2 [IU] via SUBCUTANEOUS
  Administered 2022-02-25: 1 [IU] via SUBCUTANEOUS

## 2022-02-19 MED ORDER — INSULIN GLARGINE-YFGN 100 UNIT/ML ~~LOC~~ SOLN
5.0000 [IU] | Freq: Every day | SUBCUTANEOUS | Status: DC
  Administered 2022-02-19: 5 [IU] via SUBCUTANEOUS
  Filled 2022-02-19 (×2): qty 0.05

## 2022-02-19 NOTE — H&P (View-Only) (Signed)
NAME:  [name], MRN:  [id number], DOB:  08-Sep-1961, LOS: 5 ADMISSION DATE:  03/01/2022, CONSULTATION DATE:  6/2 REFERRING MD:  Dr. Alvino Chapel, CHIEF COMPLAINT:  Unresponsive; Acute resp failure   History of Present Illness:  Patient is a 61 year old female with pertinent PMH of CLL, depression, anxiety, HLD, HTN presents to [facility] ED on 6/2 unresponsive.  On 6/2 patient called EMS for abdominal pain.  Patient initially was alert and talking but became unresponsive with AMS.  Patient was hypoxic with agonal breathing and was placed on NRB.  Patient was noted to be hypotensive.  Patient transported to [facility] ED.  [facility] patient remained unresponsive with agonal respirations.  Patient intubated for airway protection and hypoxia.  BP initially 81/65.  Given IV fluids.  Started on levo.  Fever 100.9 F and WBC 104.4.  Started on cefepime, Flagyl, Vanco.  Cultures pending.  Glucose 198, BNP 534, troponin 109 then 112, LA 4.4 then 1.8, UA unremarkable, UDS negative.  CXR with large left pleural effusion and small right pleural effusion.  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  CT abdomen with ascending colitis constipation, small intestine wall thickening possibly related to gastroenteritis; small volume ascites; extensive abdominopelvic and inguinal adenopathy.  PCCM consulted for ICU admission and transfer to [facility].  Pertinent  Medical History   Past Medical History:  Diagnosis Date   Anxiety    Chronic abdominal pain    resolved   Chronic lymphocytic leukemia (CLL), B-cell ([place]) 04/04/2016   CLL (chronic lymphocytic leukemia) ([place])    Depression with anxiety 07/07/2015   Family history of hyperlipidemia 12/14/2019   H. pylori infection 2/09   s/p prevpac   HAND PAIN, BILATERAL 01/29/2009   Qualifier: Diagnosis of  By: Moshe Cipro MD, Margaret     Hypertension    Iron deficiency 11/17/2016    Lymphadenopathy 01/17/2016   Medical non-compliance 08/22/2017   Nicotine addiction    Psychosis ([place])    social worker states that patient does not like to talk about this and will stop her meds if you try and discuss this with her.   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 CT chest and abd: 1. Extensive abdominopelvic and inguinal adenopathy, most likely related to lymphoma/leukemia. Numerous small hypodense splenic lesions versus bolus phase phenomenon. Intrahepatic periportal edema which is probably either due to fluid overload or hepatic dysfunction. The portal vein is patent within normal caliber limits. Chronic calcific pancreatitis but no findings of acute pancreatitis or ductal dilatation.  Ascending colitis versus nondistention, remainder of the colon with constipation. Diffuse small intestinal wall thickening with gastric fold thickening, could relate to gastroenteritis or hepatic dysfunction versus reactive from ascites versus congestive or infiltrating disease. 6/3 right chest tube placed. Left thora 2000 ml removed.  6/4 extubated. ollowing commands. Lots of milky pleural fluid removed yesterday - exudative, cx negative, lymphocytic. 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 Off pressors 6/6 Transferred to [facility] 6/6 ultrasound-guided right axillary lymph node biopsy -Started on pulse dose steroids at 50 mg prednisone  Interim History / Subjective:   Chest tube output still significant  Objective   Blood pressure  107/80, pulse 90, temperature 98.3 F (36.8 C), temperature source Oral, resp. rate 17, height '5\' 5"'$  (1.651 m), weight 48.7 kg, last menstrual period 07/25/2016, SpO2 97 %.        Intake/Output Summary (Last 24 hours)  at 02/19/2022 0958 Last data filed at 02/19/2022 0600 Gross per 24 hour  Intake 1296.95 ml  Output 1800 ml  Net -503.05 ml   Filed Weights   02/15/22 0114 02/16/22 0325 02/17/22 0246  Weight: 53.5 kg 50.4 kg 48.7 kg   Physical Exam: General: Elderly, frail HENT: Moist oral mucosa Eyes: No jaundice Respiratory: Right chest tube in place with chylous drainage Cardiovascular: S1-S2 appreciated Extremities:-Edema,-tenderness Neuro: AAO x4, CNII-XII grossly intact Psych: Normal mood, normal affect   Resolved Hospital Problem list   Acute encephalopathy (prb 2/2 hypercarbia)   Assessment & Plan:  Principal Problem:   Sepsis ([place]) Active Problems:   Acute respiratory failure with hypoxia (HCC)   CLL (chronic lymphocytic leukemia) (HCC)   Shock (HCC)   Bilateral pleural effusion   Alteration in electrolyte and fluid balance   Chronic diastolic heart failure (HCC)   DNR (do not resuscitate)   Protein-calorie malnutrition, severe   Pressure injury of skin  Acute respiratory failure with hypoxia Bilateral chylothorax -Chylothorax likely related to significant adenopathy -Has bulky adenopathy in the mediastinum -Started on pulsed dosing of steroids at 1 mg per keg-50 mg of prednisone started -Continue to wean oxygen supplementation off  -Continue chest tube to suction at -20 -Continue flushes with saline  Chest x-ray with worsening pleural effusion on the left -We will schedule for ultrasound-guided thoracentesis today  CLL/SLL -Oncology continues to follow  Chronic diastolic heart failure grade 2 Hypertension Hyperlipidemia -ACE and diuretic on hold at present  Sepsis -Completed course of antibiotics  Right chest tube to remain in place at present since she still has significant effluent  We will plan for ultrasound-guided thoracenteses of large left pleural effusion today -Previously drained for 2 L of chylous fluid  Chest x-ray with significant progression with  atelectasis of left lung  Patient remains DNR  Best Practice (right click and "Reselect all SmartList Selections" daily)   Diet/type: Regular consistency (see orders) DVT prophylaxis: LMWH GI prophylaxis: N/A Lines: N/A Foley:  N/A Code Status:  DNR Last date of multidisciplinary goals of care discussion [6/3 spoke with daughter at bedside plan for one way extubation. ]  Sherrilyn Rist, MD [facility] Pager: See Shea Evans

## 2022-02-19 NOTE — Progress Notes (Addendum)
PROGRESS NOTE    [name]  WYO:[id number] DOB: 1961-04-23 DOA: 03/06/2022 PCP: Noreene Larsson, NP    Chief Complaint  Patient presents with   unresponsive    Brief Narrative:  Patient is a 61 year old female with pertinent PMH of CLL, depression, anxiety, HLD, HTN presents to [facility] ED on 6/2 unresponsive.  On 6/2 patient called EMS for abdominal pain.  Patient initially was alert and talking but became unresponsive with AMS.  Patient was hypoxic with agonal breathing and was placed on NRB.  Patient was noted to be hypotensive.  Patient transported to [facility] ED.  Upon arrival to [facility] ED patient remained unresponsive with agonal respirations.  Patient intubated for airway protection and hypoxia.  BP initially 81/65.  Given IV fluids.  Started on levo.  Fever 100.9 F and WBC 104.4.  Started on cefepime, Flagyl, Vanco.  Cultures pending.  Glucose 198, BNP 534, troponin 109 then 112, LA 4.4 then 1.8, UA unremarkable, UDS negative.  CXR with large left pleural effusion and small right pleural effusion.  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  CT abdomen with ascending colitis constipation, small intestine wall thickening possibly related to gastroenteritis; small volume ascites; extensive abdominopelvic and inguinal adenopathy.  PCCM consulted for ICU admission and transfer to [facility].   Assessment & Plan:   Principal Problem:   Sepsis ([place]) Active Problems:   Acute respiratory failure with hypoxia (HCC)   CLL (chronic lymphocytic leukemia) (HCC)   Shock (HCC)   Bilateral pleural effusion   Alteration in electrolyte and fluid balance   Chronic diastolic heart failure (HCC)   DNR (do not resuscitate)   Protein-calorie malnutrition, severe   Pressure injury of skin  #1 acute respiratory failure with hypoxia/bilateral chylothorax -Chylothorax likely related to significant adenopathy/CLL. -Patient noted to  have bulky adenopathy in the mediastinum status post axillary lymph node biopsy. -Patient started on pulse dose steroids on 1 mg/kg-50 mg of prednisone started. -Status post tube placement on the right being managed per PCCM. -Chest x-ray this morning with worsening left-sided pleural effusion and patient to be scheduled for ultrasound-guided thoracentesis by PCCM today.  2.  CLL/SLL -Status post axillary lymph node biopsy. -Patient started on pulse dose steroids. -Per oncology.  3.  Chronic diastolic CHF grade 2/hypertension/hyperlipidemia -ACE inhibitor and diuretic currently on hold. -BP stable. -No signs of overt volume overload on examination. -Follow.  4.  Leukocytosis -Likely multifactorial secondary to CLL, pulse dose steroids. -Follow.  5.  Shock/hypotension/?  Sepsis -Resolved.   -Questionable etiology patient treated as a sepsis with no obvious source.   -Status post 5 days antibiotics.  6.  Hyperglycemia -Likely secondary to steroids. -Hemoglobin A1c 6.5. -CBG 340 this morning. -Start Semglee 5 units daily, SSI. -Diabetes coordinator following.  7.  Pressure injury, POA Pressure Injury 02/17/22 Sacrum Medial Stage 1 -  Intact skin with non-blanchable redness of a localized area usually over a bony prominence. (Active)  02/17/22 0745  Location: Sacrum  Location Orientation: Medial  Staging: Stage 1 -  Intact skin with non-blanchable redness of a localized area usually over a bony prominence.  Wound Description (Comments):   Present on Admission: No       DVT prophylaxis: Lovenox Code Status: DNR Family Communication: Updated patient.  No family at bedside. Disposition: TBD  Status is: Inpatient Remains inpatient appropriate because: Severity of illness.  Consultants:  Interventional radiology: Dr. Vernard Gambles 02/17/2022 General surgery: Dr. Dema Severin 02/17/2022 Oncology: Dr. Lorenso Courier 02/16/2022    [place] [facility] Events: Including procedures, antibiotic  start and stop dates in addition to other pertinent events   6/2: Patient admitted to [facility] ED unresponsive; intubated; shock started on levo; transferred to [facility]. CT chest and abd: 1. Extensive abdominopelvic and inguinal adenopathy, most likely related to lymphoma/leukemia. Numerous small hypodense splenic lesions versus bolus phase phenomenon. Intrahepatic periportal edema which is probably either due to fluid overload or hepatic dysfunction. The portal vein is patent within normal caliber limits. Chronic calcific pancreatitis but no findings of acute pancreatitis or ductal dilatation.  Ascending colitis versus nondistention, remainder of the colon with constipation. Diffuse small intestinal wall thickening with gastric fold thickening, could relate to gastroenteritis or hepatic dysfunction versus reactive from ascites versus congestive or infiltrating disease. 6/3 right chest tube placed. Left thora 2000 ml removed.  6/4 extubated. ollowing commands. Lots of milky pleural fluid removed yesterday - exudative, cx negative, lymphocytic. 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 Off pressors 6/6 Transferred to [facility]       Procedures:  CT head 03/09/2022 CT angiogram chest 02/16/2022 CT abdomen and pelvis 02/19/2022 Chest x-ray 03/01/2022, 02/15/2022, 02/23/2022, 02/17/2022, 02/19/2022 2D echo 02/15/2022 Right axillary core lymph node biopsy 02/18/2022 Ultrasound-guided thoracentesis pending 02/19/2022  Antimicrobials:  IV cefepime 02/20/2022>>>> 02/18/2022 IV Flagyl 02/13/2022>>>> 02/18/2022 IV vancomycin 03/13/2022>>> 02/15/2022   Subjective: Patient laying in bed.  States yesterday she felt better however today feels she feels drained, weak, fatigued.  States she is not gasping for air.  No chest pain.  Mid to lower abdominal discomfort.  Objective: Vitals:   02/18/22 2005 02/19/22 0526 02/19/22 0752 02/19/22 1524  BP: 107/81 113/82 107/80 123/81  Pulse: 95 82 90 93  Resp: $Remo'17 16 17 17   'CcDEp$ Temp: 99.1 F (37.3 C) 98.7 F (37.1 C) 98.3 F (36.8 C) 99.2 F (37.3 C)  TempSrc: Oral Oral Oral Oral  SpO2: 95% 98% 97% 99%  Weight:      Height:        Intake/Output Summary (Last 24 hours) at 02/19/2022 1623 Last data filed at 02/19/2022 0757 Gross per 24 hour  Intake 1176.95 ml  Output 1800 ml  Net -623.05 ml   Filed Weights   02/15/22 0114 02/16/22 0325 02/17/22 0246  Weight: 53.5 kg 50.4 kg 48.7 kg    Examination:  General exam: Appears calm and comfortable  Respiratory system: Decreased breath sounds at on the left.  No wheezing.  Fair air movement.  Right chest tube noted Cardiovascular system: S1 & S2 heard, RRR. No JVD, murmurs, rubs, gallops or clicks. No pedal edema. Gastrointestinal system: Abdomen is nondistended, soft and some tenderness to palpation in the mid to lower abdominal region.  Positive bowel sounds. Central nervous system: Alert and oriented. No focal neurological deficits. Extremities: Symmetric 5 x 5 power. Skin: No rashes, lesions or ulcers Psychiatry: Judgement and insight appear normal. Mood & affect appropriate.     Data Reviewed: I have personally reviewed following labs and imaging studies  CBC: Recent Labs  Lab 02/20/2022 1842 02/15/22 0319 02/15/22 0522 02/16/22 0318 02/17/22 0427 02/18/22 0056 02/19/22 0944  WBC 104.4*   < > 86.0* 95.3* 73.1* 81.1* 82.6*  NEUTROABS 4.9  --   --  11.4* 5.3 5.4 5.8  HGB 12.9  --  12.2 12.8 10.8* 11.1* 11.5*  HCT 44.3  --  39.3 39.9 33.8* 34.7* 36.3  MCV  96.3  --  91.0 89.5 89.7 89.2 91.4  PLT 306  --  262 190  181 147* 161 181   < > = values in this interval not displayed.    Basic Metabolic Panel: Recent Labs  Lab 02/15/22 0153 02/15/22 0319 02/15/22 0522 02/16/22 2725 02/17/22 0427 02/18/22 0056 02/19/22 0944  NA  --   --  142 144 147* 140 135  K  --   --  3.9 4.0 3.7 3.9 4.9  CL  --   --  105 110 113* 113* 106  CO2  --   --  $R'29 27 26 24 25  'hb$ GLUCOSE  --   --  164* 103* 73 145*  367*  BUN  --   --  23 25* 26* 28* 21  CREATININE 0.85  --  0.79 0.90 0.87 0.80 0.71  CALCIUM  --   --  8.4* 8.2* 8.1* 7.8* 7.7*  MG  --  2.1  --   --   --   --  2.1  PHOS 4.3  --   --   --   --   --   --     GFR: Estimated Creatinine Clearance: 56.8 mL/min (by C-G formula based on SCr of 0.71 mg/dL).  Liver Function Tests: Recent Labs  Lab 02/15/22 0522 02/16/22 0318 02/17/22 0427 02/18/22 0056 02/19/22 0944  AST 51* $Remov'31 23 21 24  'RndLQc$ ALT 32 $Remo'25 20 16 18  'lQuGk$ ALKPHOS 119 90 69 79 77  BILITOT 0.8 1.0 1.4* 0.5 0.3  PROT 5.3* 4.8* 4.3* 4.7* 4.8*  ALBUMIN 3.0* 2.3* 1.9* 2.1* 2.0*    CBG: Recent Labs  Lab 02/18/22 0740 02/18/22 1533 02/18/22 2013 02/19/22 0753 02/19/22 1244  GLUCAP 117* 208* 152* 340* 341*     Recent Results (from the past 240 hour(s))  Culture, blood (routine x 2)     Status: None   Collection Time: 02/16/2022  7:10 PM   Specimen: BLOOD RIGHT FOREARM  Result Value Ref Range Status   Specimen Description BLOOD RIGHT FOREARM BOTTLES DRAWN AEROBIC ONLY  Final   Special Requests   Final    Blood Culture results may not be optimal due to an inadequate volume of blood received in culture bottles   Culture   Final    NO GROWTH 5 DAYS Performed at [facility], [address] [place], [place] [address]    Report Status 02/19/2022 FINAL  Final  Culture, blood (routine x 2)     Status: None   Collection Time: 03/07/2022  7:42 PM   Specimen: BLOOD RIGHT ARM  Result Value Ref Range Status   Specimen Description BLOOD RIGHT ARM BOTTLES DRAWN AEROBIC ONLY  Final   Special Requests Blood Culture adequate volume  Final   Culture   Final    NO GROWTH 5 DAYS Performed at [facility], [address], [place], [place] [address]    Report Status 02/19/2022 FINAL  Final  MRSA Next Gen by PCR, Nasal     Status: None   Collection Time: 02/15/22  1:20 AM   Specimen: Nasal Mucosa; Nasal Swab  Result Value Ref Range Status   MRSA by PCR Next Gen NOT DETECTED NOT DETECTED Final     Comment: (NOTE) The GeneXpert MRSA Assay (FDA approved for NASAL specimens only), is one component of a comprehensive MRSA colonization surveillance program. It is not intended to diagnose MRSA infection nor to guide or monitor treatment for MRSA infections. Test performance is  not FDA approved in patients less than 1 years old. Performed at [facility], [place] [address], [place], [place] [address]   Urine Culture     Status: None   Collection Time: 02/15/22  1:53 AM   Specimen: Urine, Clean Catch  Result Value Ref Range Status   Specimen Description URINE, CLEAN CATCH  Final   Special Requests NONE  Final   Culture   Final    NO GROWTH Performed at [facility], [place] [address] [place], [place] [address]    Report Status 02/16/2022 FINAL  Final  Body fluid culture w Gram Stain     Status: None   Collection Time: 02/15/22  1:45 PM   Specimen: Pleura; Body Fluid  Result Value Ref Range Status   Specimen Description PLEURAL  Final   Special Requests NONE  Final   Gram Stain   Final    FEW WBC PRESENT, PREDOMINANTLY MONONUCLEAR NO ORGANISMS SEEN    Culture   Final    NO GROWTH 3 DAYS Performed at [facility], [address], [place], [place] [address]    Report Status 02/18/2022 FINAL  Final  Gram stain     Status: None   Collection Time: 02/15/22  4:30 PM   Specimen: Pleura  Result Value Ref Range Status   Specimen Description PLEURAL FLUID RIGHT LUNG  Final   Special Requests NONE  Final   Gram Stain   Final    NO SQUAMOUS EPITHELIAL CELLS SEEN FEW WBC SEEN NO ORGANISMS SEEN Performed at [facility], [place] [address], [place], [place] [address]    Report Status 02/15/2022 FINAL  Final         Radiology Studies: DG CHEST PORT 1 VIEW  Result Date: 02/19/2022 CLINICAL DATA:  Colles thorax.  Chest tube follow-up. EXAM: PORTABLE CHEST 1 VIEW COMPARISON:  02/17/2022 FINDINGS: Thoracostomy tube in the lower lateral right chest remains in  place. No pleural air. No visible pleural fluid on the right. Increasing amount of pleural fluid on the left, with complete collapse of the left lower lobe and subtotal collapse of the left upper lobe. Central line has been removed. IMPRESSION: Persistent right pleural catheter. No right pleural air or visible pleural fluid. Enlarging volume of pleural fluid on the left, now with complete collapse of the left lower lobe and subtotal collapse of the left upper lobe. Electronically Signed   By: Nelson Chimes M.D.   On: 02/19/2022 07:25   [place] CORE BIOPSY (LYMPH NODES)  Result Date: 02/18/2022 INDICATION: 61 year old female with history CLL presenting multifocal bilateral axillary masses. EXAM: Ultrasound-guided axillary lymph node biopsy MEDICATIONS: None. ANESTHESIA/SEDATION: None. FLUOROSCOPY TIME:  None. COMPLICATIONS: None immediate. PROCEDURE: Informed written consent was obtained from the patient after a thorough discussion of the procedural risks, benefits and alternatives. All questions were addressed. Maximal Sterile Barrier Technique was utilized including caps, mask, sterile gowns, sterile gloves, sterile drape, hand hygiene and skin antiseptic. A timeout was performed prior to the initiation of the procedure. Preprocedure ultrasound evaluation demonstrated a prominent, subcutaneous heterogeneously hypoechoic enlarged lymph node measuring approximately 3.3 x 1.9 cm in maximum longitudinal dimensions. The procedure was planned. The right axilla was prepped and draped in standard fashion. Subdermal Local anesthesia was administered with 1% lidocaine at the planned needle entry site. A small skin nick was made. Under direct ultrasound visualization, a 17 gauge introducer needle was directed to the periphery of the lymph node. Next, a  total of 4, 18 gauge core biopsies were obtained. Two samples were placed in saline and 2 samples were placed in formalin. The needle was removed. Postprocedure ultrasound  demonstrated no evidence of surrounding hematoma or other complicating features. Hemostasis was achieved with brief manual compression. A sterile bandage was applied. The patient tolerated the procedure well without complication. The patient was transferred back to the floor in stable condition. IMPRESSION: Technically successful ultrasound-guided right axillary lymph node biopsy. Ruthann Cancer, MD [facility] Electronically Signed   By: Ruthann Cancer M.D.   On: 02/18/2022 17:53        Scheduled Meds:  chlorhexidine gluconate (MEDLINE KIT)  15 mL Mouth Rinse BID   docusate sodium  100 mg Oral BID   enoxaparin (LOVENOX) injection  40 mg Subcutaneous Q24H   feeding supplement  1 Container Oral TID BM   insulin aspart  0-9 Units Subcutaneous TID WC   insulin glargine-yfgn  5 Units Subcutaneous Daily   multivitamin with minerals  1 tablet Oral Daily   polyethylene glycol  17 g Oral Daily   predniSONE  50 mg Oral Q breakfast   sodium chloride flush  10-40 mL Intracatheter Q12H   Continuous Infusions:  sodium chloride 10 mL/hr at 02/17/22 0800     LOS: 5 days    Time spent: 40 minutes    Irine Seal, MD [facility]   To contact the attending provider between 7A-7P or the covering provider during after hours 7P-7A, please log into the web site [email or web address] and access using universal [place] password for that web site. If you do not have the password, please call the hospital operator.  02/19/2022, 4:23 PM

## 2022-02-19 NOTE — Progress Notes (Signed)
NAME:  [name], MRN:  [id number], DOB:  Dec 04, 1960, LOS: 5 ADMISSION DATE:  03/09/2022, CONSULTATION DATE:  6/2 REFERRING MD:  Dr. Alvino Chapel, CHIEF COMPLAINT:  Unresponsive; Acute resp failure   History of Present Illness:  Patient is a 61 year old female with pertinent PMH of CLL, depression, anxiety, HLD, HTN presents to [facility] ED on 6/2 unresponsive.  On 6/2 patient called EMS for abdominal pain.  Patient initially was alert and talking but became unresponsive with AMS.  Patient was hypoxic with agonal breathing and was placed on NRB.  Patient was noted to be hypotensive.  Patient transported to [facility] ED.  Upon arrival to [facility] ED patient remained unresponsive with agonal respirations.  Patient intubated for airway protection and hypoxia.  BP initially 81/65.  Given IV fluids.  Started on levo.  Fever 100.9 F and WBC 104.4.  Started on cefepime, Flagyl, Vanco.  Cultures pending.  Glucose 198, BNP 534, troponin 109 then 112, LA 4.4 then 1.8, UA unremarkable, UDS negative.  CXR with large left pleural effusion and small right pleural effusion.  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  CT abdomen with ascending colitis constipation, small intestine wall thickening possibly related to gastroenteritis; small volume ascites; extensive abdominopelvic and inguinal adenopathy.  PCCM consulted for ICU admission and transfer to [facility].  Pertinent  Medical History   Past Medical History:  Diagnosis Date   Anxiety    Chronic abdominal pain    resolved   Chronic lymphocytic leukemia (CLL), B-cell ([place]) 04/04/2016   CLL (chronic lymphocytic leukemia) ([place])    Depression with anxiety 07/07/2015   Family history of hyperlipidemia 12/14/2019   H. pylori infection 2/09   s/p prevpac   HAND PAIN, BILATERAL 01/29/2009   Qualifier: Diagnosis of  By: Moshe Cipro MD, Margaret     Hypertension    Iron deficiency 11/17/2016    Lymphadenopathy 01/17/2016   Medical non-compliance 08/22/2017   Nicotine addiction    Psychosis ([place])    social worker states that patient does not like to talk about this and will stop her meds if you try and discuss this with her.   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 CT chest and abd: 1. Extensive abdominopelvic and inguinal adenopathy, most likely related to lymphoma/leukemia. Numerous small hypodense splenic lesions versus bolus phase phenomenon. Intrahepatic periportal edema which is probably either due to fluid overload or hepatic dysfunction. The portal vein is patent within normal caliber limits. Chronic calcific pancreatitis but no findings of acute pancreatitis or ductal dilatation.  Ascending colitis versus nondistention, remainder of the colon with constipation. Diffuse small intestinal wall thickening with gastric fold thickening, could relate to gastroenteritis or hepatic dysfunction versus reactive from ascites versus congestive or infiltrating disease. 6/3 right chest tube placed. Left thora 2000 ml removed.  6/4 extubated. ollowing commands. Lots of milky pleural fluid removed yesterday - exudative, cx negative, lymphocytic. 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 Off pressors 6/6 Transferred to [facility] 6/6 ultrasound-guided right axillary lymph node biopsy -Started on pulse dose steroids at 50 mg prednisone  Interim History / Subjective:   Chest tube output still significant  Objective   Blood pressure  107/80, pulse 90, temperature 98.3 F (36.8 C), temperature source Oral, resp. rate 17, height '5\' 5"'$  (1.651 m), weight 48.7 kg, last menstrual period 07/25/2016, SpO2 97 %.        Intake/Output Summary (Last 24 hours)  at 02/19/2022 0958 Last data filed at 02/19/2022 0600 Gross per 24 hour  Intake 1296.95 ml  Output 1800 ml  Net -503.05 ml   Filed Weights   02/15/22 0114 02/16/22 0325 02/17/22 0246  Weight: 53.5 kg 50.4 kg 48.7 kg   Physical Exam: General: Elderly, frail HENT: Moist oral mucosa Eyes: No jaundice Respiratory: Right chest tube in place with chylous drainage Cardiovascular: S1-S2 appreciated Extremities:-Edema,-tenderness Neuro: AAO x4, CNII-XII grossly intact Psych: Normal mood, normal affect   Resolved Hospital Problem list   Acute encephalopathy (prb 2/2 hypercarbia)   Assessment & Plan:  Principal Problem:   Sepsis ([place]) Active Problems:   Acute respiratory failure with hypoxia (HCC)   CLL (chronic lymphocytic leukemia) (HCC)   Shock (HCC)   Bilateral pleural effusion   Alteration in electrolyte and fluid balance   Chronic diastolic heart failure (HCC)   DNR (do not resuscitate)   Protein-calorie malnutrition, severe   Pressure injury of skin  Acute respiratory failure with hypoxia Bilateral chylothorax -Chylothorax likely related to significant adenopathy -Has bulky adenopathy in the mediastinum -Started on pulsed dosing of steroids at 1 mg per keg-50 mg of prednisone started -Continue to wean oxygen supplementation off  -Continue chest tube to suction at -20 -Continue flushes with saline  Chest x-ray with worsening pleural effusion on the left -We will schedule for ultrasound-guided thoracentesis today  CLL/SLL -Oncology continues to follow  Chronic diastolic heart failure grade 2 Hypertension Hyperlipidemia -ACE and diuretic on hold at present  Sepsis -Completed course of antibiotics  Right chest tube to remain in place at present since she still has significant effluent  We will plan for ultrasound-guided thoracenteses of large left pleural effusion today -Previously drained for 2 L of chylous fluid  Chest x-ray with significant progression with  atelectasis of left lung  Patient remains DNR  Best Practice (right click and "Reselect all SmartList Selections" daily)   Diet/type: Regular consistency (see orders) DVT prophylaxis: LMWH GI prophylaxis: N/A Lines: N/A Foley:  N/A Code Status:  DNR Last date of multidisciplinary goals of care discussion [6/3 spoke with daughter at bedside plan for one way extubation. ]  Sherrilyn Rist, MD [place] [facility]M Pager: See Shea Evans

## 2022-02-20 ENCOUNTER — Encounter (HOSPITAL_COMMUNITY): Payer: Self-pay | Admitting: Pulmonary Disease

## 2022-02-20 ENCOUNTER — Inpatient Hospital Stay (HOSPITAL_COMMUNITY): Payer: Medicaid Other

## 2022-02-20 ENCOUNTER — Other Ambulatory Visit: Payer: Self-pay | Admitting: Oncology

## 2022-02-20 ENCOUNTER — Encounter (HOSPITAL_COMMUNITY): Admission: EM | Disposition: E | Payer: Self-pay | Source: Home / Self Care | Attending: Internal Medicine

## 2022-02-20 DIAGNOSIS — J9601 Acute respiratory failure with hypoxia: Secondary | ICD-10-CM | POA: Diagnosis not present

## 2022-02-20 DIAGNOSIS — A419 Sepsis, unspecified organism: Secondary | ICD-10-CM | POA: Diagnosis not present

## 2022-02-20 DIAGNOSIS — I5032 Chronic diastolic (congestive) heart failure: Secondary | ICD-10-CM | POA: Diagnosis not present

## 2022-02-20 DIAGNOSIS — C9110 Chronic lymphocytic leukemia of B-cell type not having achieved remission: Secondary | ICD-10-CM

## 2022-02-20 DIAGNOSIS — J90 Pleural effusion, not elsewhere classified: Secondary | ICD-10-CM | POA: Diagnosis not present

## 2022-02-20 HISTORY — PX: THORACENTESIS: SHX235

## 2022-02-20 LAB — CBC WITH DIFFERENTIAL/PLATELET
Abs Immature Granulocytes: 0.23 10*3/uL — ABNORMAL HIGH (ref 0.00–0.07)
Basophils Absolute: 0.1 10*3/uL (ref 0.0–0.1)
Basophils Relative: 0 %
Eosinophils Absolute: 0 10*3/uL (ref 0.0–0.5)
Eosinophils Relative: 0 %
HCT: 35.9 % — ABNORMAL LOW (ref 36.0–46.0)
Hemoglobin: 11.1 g/dL — ABNORMAL LOW (ref 12.0–15.0)
Immature Granulocytes: 0 %
Lymphocytes Relative: 91 %
Lymphs Abs: 78.2 10*3/uL — ABNORMAL HIGH (ref 0.7–4.0)
MCH: 28.5 pg (ref 26.0–34.0)
MCHC: 30.9 g/dL (ref 30.0–36.0)
MCV: 92.1 fL (ref 80.0–100.0)
Monocytes Absolute: 0.5 10*3/uL (ref 0.1–1.0)
Monocytes Relative: 1 %
Neutro Abs: 6.7 10*3/uL (ref 1.7–7.7)
Neutrophils Relative %: 8 %
Platelets: 211 10*3/uL (ref 150–400)
RBC: 3.9 MIL/uL (ref 3.87–5.11)
RDW: 17.3 % — ABNORMAL HIGH (ref 11.5–15.5)
WBC: 85.7 10*3/uL (ref 4.0–10.5)
nRBC: 0 % (ref 0.0–0.2)

## 2022-02-20 LAB — BODY FLUID CELL COUNT WITH DIFFERENTIAL
Lymphs, Fluid: 92 %
Monocyte-Macrophage-Serous Fluid: 6 % — ABNORMAL LOW (ref 50–90)
Neutrophil Count, Fluid: 2 % (ref 0–25)
Total Nucleated Cell Count, Fluid: 1030 cu mm — ABNORMAL HIGH (ref 0–1000)

## 2022-02-20 LAB — PROTEIN, PLEURAL OR PERITONEAL FLUID: Total protein, fluid: <3 g/dL

## 2022-02-20 LAB — GLUCOSE, PLEURAL OR PERITONEAL FLUID: Glucose, Fluid: 192 mg/dL

## 2022-02-20 LAB — CULTURE, BODY FLUID W GRAM STAIN -BOTTLE: Culture: NO GROWTH

## 2022-02-20 LAB — BASIC METABOLIC PANEL
Anion gap: 5 (ref 5–15)
BUN: 24 mg/dL — ABNORMAL HIGH (ref 8–23)
CO2: 29 mmol/L (ref 22–32)
Calcium: 7.8 mg/dL — ABNORMAL LOW (ref 8.9–10.3)
Chloride: 103 mmol/L (ref 98–111)
Creatinine, Ser: 0.69 mg/dL (ref 0.44–1.00)
GFR, Estimated: >60 mL/min (ref >60)
Glucose, Bld: 239 mg/dL — ABNORMAL HIGH (ref 70–99)
Potassium: 4.8 mmol/L (ref 3.5–5.1)
Sodium: 137 mmol/L (ref 135–145)

## 2022-02-20 LAB — GLUCOSE, CAPILLARY
Glucose-Capillary: 132 mg/dL — ABNORMAL HIGH (ref 70–99)
Glucose-Capillary: 186 mg/dL — ABNORMAL HIGH (ref 70–99)
Glucose-Capillary: 136 mg/dL — ABNORMAL HIGH (ref 70–99)
Glucose-Capillary: 191 mg/dL — ABNORMAL HIGH (ref 70–99)

## 2022-02-20 SURGERY — THORACENTESIS
Anesthesia: LOCAL | Laterality: Left

## 2022-02-20 MED ORDER — INSULIN GLARGINE-YFGN 100 UNIT/ML ~~LOC~~ SOLN
8.0000 [IU] | Freq: Every day | SUBCUTANEOUS | Status: DC
  Administered 2022-02-20 – 2022-02-26 (×7): 8 [IU] via SUBCUTANEOUS
  Filled 2022-02-20 (×9): qty 0.08

## 2022-02-20 NOTE — Progress Notes (Signed)
Received call from radiology stating abnormal finding on patients chest xray-- right apical pneumothorax. Made doctor Grandville Silos and [facility] aware. Doctor Ander Slade stated that he would keep an eye on it.

## 2022-02-20 NOTE — Progress Notes (Signed)
Brief oncology note:  Lymph node biopsy has resulted.  Consistent with CLL.  The patient has been started on prednisone 1 mg/kg.  I have sent a referral to the [place] to arrange for outpatient follow-up with Dr. Delton Coombes 1 to 2 weeks following hospital discharge.  The patient will be contacted with the date/time of her appointment.  Surgical Pathology  CASE: [id number]  PATIENT: [name]  Flow Pathology Report   Clinical history: CLL   DIAGNOSIS:   Lymph node, right axilla, needle core biopsy, flow cytometry:  -  Monoclonal lambda population of CD20/CD19 positive B cells  coexpressing CD5 and CD200 immunophenotypically consistent with the  patient's known history of chronic lymphocytic leukemia/small  lymphocytic lymphoma (CLL/SLL).   GATING AND PHENOTYPIC ANALYSIS:   Gated population: Flow cytometric immunophenotyping is performed using  antibodies to the antigens listed in the table below. Electronic gates  are placed around a cell cluster displaying light scatter properties  corresponding to: lymphocytes   Abnormal Cells in gated population: 90%   Phenotype of Abnormal Cells: CD5, CD19, CD20, CD200, Lambda                      Lymphoid Antigens       Myeloid Antigens  Miscellaneous  CD2  NEG  CD10 NEG  CD11b     ND   CD45 POS  CD3  NEG  CD19 POS  CD11c     ND   HLA-Dr    ND  CD4  NEG  CD20 POS  CD13 ND   CD34 NEG  CD5  POS  CD22 ND   CD14 ND   CD38 NEG  CD7  NEG  CD79b     ND   CD15 ND   CD138     ND  CD8  NEG  CD103     ND   CD16 ND   TdT  ND  CD25 ND   CD200     POS  CD33 ND   CD123     ND  TCRab     ND   sKappa    NEG  CD64 ND   CD41 ND  TCRgd     NEG  sLambda   POS  CD117     ND   CD61 ND  CD56 NEG  cKappa    ND   MPO  ND   CD71 ND  CD57 ND   cLambda   ND        CD235aND    GROSS DESCRIPTION:   Reference tissue case [id number].   Final Diagnosis performed by Tilford Pillar DO.   Electronically signed  02/19/2022   Mikey Bussing

## 2022-02-20 NOTE — Op Note (Signed)
Thoracentesis Procedure Note  Pre-operative Diagnosis: large left effusion  Post-operative Diagnosis: same  Indications: large left effusion  Procedure Details   Consent: Informed consent was obtained. Risks of the procedure were discussed including: infection, bleeding, pain, pneumothorax.  Under sterile conditions the patient was positioned. Betadine solution and sterile drapes were utilized.  1% plain lidocaine was used to anesthetize the 6th rib space. Fluid was obtained without any difficulties and minimal blood loss.  A dressing was applied to the wound and wound care instructions were provided.   Findings 1400 ml of cloudy pleural fluid was obtained. A sample was sent to Pathology for cytogenetics, flow, and cell counts, as well as for infection analysis.  Complications:  None; patient tolerated the procedure well.        Condition: stable  Plan A follow up chest x-ray was ordered. Bed Rest for 0 hours. Tylenol 650 mg. for pain.  Attending Attestation: I performed the procedure.

## 2022-02-20 NOTE — Progress Notes (Addendum)
Nutrition Follow-up  DOCUMENTATION CODES:   Severe malnutrition in context of chronic illness, Underweight  INTERVENTION:   Boost Breeze po TID, each supplement provides 250 kcal, 9 grams of protein, 0 gm fat.  C[employer]ation Breakfast Essentials mixed with skim milk TID with meals, each supplement provides 220 kcal, 13 gm protein, 1 gm fat.  Multivitamin with minerals daily.  EFA supplementation may be needed if low fat diet is required for > 5-7 days.   Continue low fat diet allowing < 20 gm fat per day. May need to consider NPO status and TPN if pleural fluid output remains > 500 ml per day.  NUTRITION DIAGNOSIS:   Severe Malnutrition related to chronic illness (CLL) as evidenced by severe muscle depletion, severe fat depletion.  Ongoing   GOAL:   Patient will meet greater than or equal to 90% of their needs  Progressing   MONITOR:   PO intake, Supplement acceptance, Labs  REASON FOR ASSESSMENT:   Consult Assessment of nutrition requirement/status (low fat diet)  ASSESSMENT:   61 yo female admitted with AMS and unresponsiveness. PMH includes CLL, depression, anxiety, HLD, HTN, lymphadenopathy, iron deficiency.  Patient is currently on a regular diet, low fat.  Meal intakes documented at 65-100%. She is being offered Boost Breeze TID and receiving [employer] Essentials TID with meals.   Patient is out of her room for thoracentesis. Spoke with RN. Discussed low fat diet and snacks to avoid giving patient, such as peanut butter.  Chylothorax suspected. Pleural fluid TG level 1213 (H) on 6/3, cholesterol level pending. Chest tube output 1500 ml x 24 hours Another pleural fluid TG level has been ordered for today.  MCT oil was ordered 6/5, but no longer on MAR.  Labs reviewed. CBG: 132-186  Medications reviewed and include Colace, Novolog, Miralax.   Diet Order:   Diet Order             Diet regular Room service appropriate? Yes; Fluid consistency:  Thin  Diet effective now                   EDUCATION NEEDS:   Education needs have been addressed  Skin:  Skin Assessment: Skin Integrity Issues: Skin Integrity Issues:: Stage I Stage I: sacrum  Last BM:  6/6  Height:   Ht Readings from Last 1 Encounters:  02/22/2022 '5\' 5"'$  (1.651 m)    Weight:   Wt Readings from Last 1 Encounters:  02/28/2022 47.5 kg    Ideal Body Weight:  56.8 kg  BMI:  Body mass index is 17.43 kg/m.  Estimated Nutritional Needs:   Kcal:  1700-1900  Protein:  70-90 gm  Fluid:  1.7-1.9 L    Lucas Mallow RD, LDN, CNSC Please refer to Amion for contact information.

## 2022-02-20 NOTE — Progress Notes (Signed)
Patient ID: [name], female   DOB: 01/17/1961, 61 y.o.   MRN: [id number]  Underwent thoracentesis  Tolerated well

## 2022-02-20 NOTE — Progress Notes (Signed)
Inpatient Diabetes Program Recommendations  AACE/ADA: New Consensus Statement on Inpatient Glycemic Control (2015)  Target Ranges:  Prepandial:   less than 140 mg/dL      Peak postprandial:   less than 180 mg/dL (1-2 hours)      Critically ill patients:  140 - 180 mg/dL   Lab Results  Component Value Date   GLUCAP 277 (H) 02/19/2022   HGBA1C 6.5 (H) 02/15/2022    Review of Glycemic Control  Latest Reference Range & Units 02/19/22 07:53 02/19/22 12:44 02/19/22 16:24 02/19/22 21:28  Glucose-Capillary 70 - 99 mg/dL 340 (H) 341 (H) 324 (H) 277 (H)   Diabetes history: None  Current orders for Inpatient glycemic control:  Semglee 5 units Daily Novolog 0-9 units tid   PO prednisone 50 mg Daily  Inpatient Diabetes Program Recommendations:    -  Increase Semglee to 8 units -  Add Novolog 3 units tid meal coverage if eating >50% of meals  Thanks, Tama Headings RN, MSN, BC-ADM Inpatient Diabetes Coordinator Team Pager [phone] (8a-5p)

## 2022-02-20 NOTE — Interval H&P Note (Signed)
History and Physical Interval Note:  03/03/2022 8:48 AM  [name]  has presented today for surgery, with the diagnosis of large left effusion.  The various methods of treatment have been discussed with the patient and family. After consideration of risks, benefits and other options for treatment, the patient has consented to  Procedure(s) with comments: THORACENTESIS (Left) - enlarging left effusion as a surgical intervention.  The patient's history has been reviewed, patient examined, no change in status, stable for surgery.  I have reviewed the patient's chart and labs.  Questions were answered to the patient's satisfaction.     [name]

## 2022-02-20 NOTE — Progress Notes (Signed)
NAME:  [name], MRN:  [id number], DOB:  November 17, 1960, LOS: 6 ADMISSION DATE:  02/19/2022, CONSULTATION DATE:  6/2 REFERRING MD:  Dr. Alvino Chapel, CHIEF COMPLAINT:  Unresponsive; Acute resp failure   History of Present Illness:  Patient is a 61 year old female with pertinent PMH of CLL, depression, anxiety, HLD, HTN presents to [facility] ED on 6/2 unresponsive.  On 6/2 patient called EMS for abdominal pain.  Patient initially was alert and talking but became unresponsive with AMS.  Patient was hypoxic with agonal breathing and was placed on NRB.  Patient was noted to be hypotensive.  Patient transported to [facility] ED.  Upon arrival to [facility] ED patient remained unresponsive with agonal respirations.  Patient intubated for airway protection and hypoxia.  BP initially 81/65.  Given IV fluids.  Started on levo.  Fever 100.9 F and WBC 104.4.  Started on cefepime, Flagyl, Vanco.  Cultures pending.  Glucose 198, BNP 534, troponin 109 then 112, LA 4.4 then 1.8, UA unremarkable, UDS negative.  CXR with large left pleural effusion and small right pleural effusion.  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  CT abdomen with ascending colitis constipation, small intestine wall thickening possibly related to gastroenteritis; small volume ascites; extensive abdominopelvic and inguinal adenopathy.  PCCM consulted for ICU admission and transfer to [facility].  Pertinent  Medical History   Past Medical History:  Diagnosis Date   Anxiety    Chronic abdominal pain    resolved   Chronic lymphocytic leukemia (CLL), B-cell ([place]) 04/04/2016   CLL (chronic lymphocytic leukemia) ([place])    Depression with anxiety 07/07/2015   Family history of hyperlipidemia 12/14/2019   H. pylori infection 2/09   s/p prevpac   HAND PAIN, BILATERAL 01/29/2009   Qualifier: Diagnosis of  By: Moshe Cipro MD, Margaret     Hypertension    Iron deficiency 11/17/2016    Lymphadenopathy 01/17/2016   Medical non-compliance 08/22/2017   Nicotine addiction    Psychosis ([place])    social worker states that patient does not like to talk about this and will stop her meds if you try and discuss this with her.   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 CT chest and abd: 1. Extensive abdominopelvic and inguinal adenopathy, most likely related to lymphoma/leukemia. Numerous small hypodense splenic lesions versus bolus phase phenomenon. Intrahepatic periportal edema which is probably either due to fluid overload or hepatic dysfunction. The portal vein is patent within normal caliber limits. Chronic calcific pancreatitis but no findings of acute pancreatitis or ductal dilatation.  Ascending colitis versus nondistention, remainder of the colon with constipation. Diffuse small intestinal wall thickening with gastric fold thickening, could relate to gastroenteritis or hepatic dysfunction versus reactive from ascites versus congestive or infiltrating disease. 6/3 right chest tube placed. Left thora 2000 ml removed.  6/4 extubated. ollowing commands. Lots of milky pleural fluid removed yesterday - exudative, cx negative, lymphocytic. 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 Off pressors 6/6 Transferred to [facility] 6/6 ultrasound-guided right axillary lymph node biopsy -Started on pulse dose steroids at 50 mg prednisone 6/7 chest tube output over a liter  Interim History / Subjective:   Chest tube output still  significant Over a liter out of right pleural space  Objective   Blood pressure (!) 124/54, pulse 95, temperature 98.8 F (37.1 C), temperature source Oral, resp. rate 17, height '5\' 5"'$  (1.651 m), weight 49.5 kg, last menstrual  period 07/25/2016, SpO2 97 %.        Intake/Output Summary (Last 24 hours) at 03/03/2022 0852 Last data filed at 03/11/2022 0600 Gross per 24 hour  Intake 740 ml  Output 1500 ml  Net -760 ml   Filed Weights   02/16/22 0325 02/17/22 0246 02/26/2022 0616  Weight: 50.4 kg 48.7 kg 49.5 kg   Physical Exam: General: Elderly, frail HENT: Moist oral mucosa Eyes: Anicteric Respiratory: Right chest tube in place with chylous drainage Cardiovascular: S1-S2 appreciated Extremities:-Edema,-tenderness Neuro: AAO x4, CNII-XII grossly intact Psych: Normal mood, normal affect   Resolved Hospital Problem list   Acute encephalopathy (prb 2/2 hypercarbia)   Assessment & Plan:  Principal Problem:   Sepsis ([place]) Active Problems:   Acute respiratory failure with hypoxia (HCC)   CLL (chronic lymphocytic leukemia) (HCC)   Shock (HCC)   Bilateral pleural effusion   Alteration in electrolyte and fluid balance   Chronic diastolic heart failure (HCC)   DNR (do not resuscitate)   Protein-calorie malnutrition, severe   Pressure injury of skin  Acute respiratory failure with hypoxia Bilateral chylothorax -Chylothorax likely related to significant adenopathy -Bulky adenopathy in the mediastinum -On pulse dose steroids at 1 mg per kg, on 50 mg of prednisone daily -Continue weaning oxygen supplementation  Chest tube remains to suction -20 -Continue chest tube flushes  Worsening effusion on the left -Patient is scheduled for ultrasound-guided thoracentesis today  CLL -Oncology continues to follow  Diastolic heart failure grade 2 Hypertension Hyperlipidemia  Sepsis -Completed course of antibiotics  Patient remains DNR   Best Practice (right click and "Reselect all SmartList Selections" daily)   Diet/type: Regular consistency (see orders) DVT prophylaxis: LMWH GI prophylaxis: N/A Lines: N/A Foley:  N/A Code Status:  DNR Last date of multidisciplinary goals of care discussion [6/3  spoke with daughter at bedside plan for one way extubation. ]  Sherrilyn Rist, MD [place] [facility]M Pager: See Shea Evans

## 2022-02-20 NOTE — Progress Notes (Signed)
PROGRESS NOTE    [name]  JYN:[id number] DOB: [birth year] DOA: 02/23/2022 PCP: Noreene Larsson, NP    Chief Complaint  Patient presents with   unresponsive    Brief Narrative:  Patient is a 61 year old female with pertinent PMH of CLL, depression, anxiety, HLD, HTN presents to [facility] ED on 6/2 unresponsive.  On 6/2 patient called EMS for abdominal pain.  Patient initially was alert and talking but became unresponsive with AMS.  Patient was hypoxic with agonal breathing and was placed on NRB.  Patient was noted to be hypotensive.  Patient transported to [facility] ED.  Upon arrival to [facility] ED patient remained unresponsive with agonal respirations.  Patient intubated for airway protection and hypoxia.  BP initially 81/65.  Given IV fluids.  Started on levo.  Fever 100.9 F and WBC 104.4.  Started on cefepime, Flagyl, Vanco.  Cultures pending.  Glucose 198, BNP 534, troponin 109 then 112, LA 4.4 then 1.8, UA unremarkable, UDS negative.  CXR with large left pleural effusion and small right pleural effusion.  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  CT abdomen with ascending colitis constipation, small intestine wall thickening possibly related to gastroenteritis; small volume ascites; extensive abdominopelvic and inguinal adenopathy.  PCCM consulted for ICU admission and transfer to [facility].   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 -Patient noted to  have bulky adenopathy in the mediastinum status post axillary lymph node biopsy. -Patient started on pulse dose steroids on 1 mg/kg-50 mg of prednisone started. -Status post tube placement on the right being managed per PCCM. -Chest x-ray with worsening left-sided pleural effusion and patient to be scheduled for ultrasound-guided thoracentesis by PCCM today.  2.  CLL/SLL -Status post axillary lymph node biopsy with biopsy results consistent with CLL/SLL.. -Patient started on pulse dose steroids. -Per oncology.  3.  Chronic diastolic CHF grade 2/hypertension/hyperlipidemia -ACE inhibitor and diuretic currently on hold. -BP stable. -No signs of overt volume overload on examination. -Follow.  4.  Leukocytosis -Likely multifactorial secondary to CLL, pulse dose steroids. -Oncology following. -Follow.  5.  Shock/hypotension/?  Sepsis -Resolved.   -Questionable etiology patient treated as a sepsis with no obvious source.   -Status post 5 days antibiotics. -No further antibiotics needed  6.  Hyperglycemia -Likely secondary to steroids. -Hemoglobin A1c 6.5. -CBG 132 this morning. -Increase Semglee to 8 units daily.   -SSI.  -Diabetes coordinator following.  7.  Pressure injury, POA Pressure Injury 02/17/22 Sacrum Medial Stage 1 -  Intact skin with non-blanchable redness of a localized area usually over a bony prominence. (Active)  02/17/22 0745  Location: Sacrum  Location Orientation: Medial  Staging: Stage 1 -  Intact skin with non-blanchable redness of a localized area usually over a bony prominence.  Wound Description (Comments):   Present on Admission: No       DVT prophylaxis: Lovenox Code Status: DNR Family Communication: Updated patient.  No family at bedside. Disposition:  TBD  Status is: Inpatient Remains inpatient appropriate because: Severity of illness.   Consultants:  Interventional radiology: Dr. Vernard Gambles 02/17/2022 General surgery: Dr. Dema Severin  02/17/2022 Oncology: Dr. Lorenso Courier 02/16/2022    [place] [facility] Events: Including procedures, antibiotic start and stop dates in addition to other pertinent events   6/2: Patient admitted to [facility] ED unresponsive; intubated; shock started on levo; transferred to [facility]. CT chest and abd: 1. Extensive abdominopelvic and inguinal adenopathy, most likely related to lymphoma/leukemia. Numerous small hypodense splenic lesions versus bolus phase phenomenon. Intrahepatic periportal edema which is probably either due to fluid overload or hepatic dysfunction. The portal vein is patent within normal caliber limits. Chronic calcific pancreatitis but no findings of acute pancreatitis or ductal dilatation.  Ascending colitis versus nondistention, remainder of the colon with constipation. Diffuse small intestinal wall thickening with gastric fold thickening, could relate to gastroenteritis or hepatic dysfunction versus reactive from ascites versus congestive or infiltrating disease. 6/3 right chest tube placed. Left thora 2000 ml removed.  6/4 extubated. ollowing commands. Lots of milky pleural fluid removed yesterday - exudative, cx negative, lymphocytic. 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 Off pressors 6/6 Transferred to [facility]       Procedures:  CT head 03/10/2022 CT angiogram chest 02/24/2022 CT abdomen and pelvis 02/13/2022 Chest x-ray 03/11/2022, 02/15/2022, 02/15/2022, 02/17/2022, 02/19/2022 2D echo 02/15/2022 Right axillary core lymph node biopsy 02/18/2022 Ultrasound-guided thoracentesis pending 02/14/2022  Antimicrobials:  IV cefepime 03/01/2022>>>> 02/18/2022 IV Flagyl 03/09/2022>>>> 02/18/2022 IV vancomycin 02/23/2022>>> 02/15/2022   Subjective: Patient sleeping but arousable.  No chest pain.  No shortness of breath.  States abdominal discomfort slowly improving.  Awaiting thoracentesis to be done.    Objective: Vitals:   02/23/2022 0601 03/04/2022 0616 02/14/2022 0755 03/10/2022 0803  BP:  116/80  112/88 (!) 124/54  Pulse: 96  91 95  Resp: $Remo'20  18 17  'lSkMI$ Temp: 98.8 F (37.1 C)  98.6 F (37 C) 98.8 F (37.1 C)  TempSrc: Oral  Oral Oral  SpO2: 98%  99% 97%  Weight:  49.5 kg  47.5 kg  Height:        Intake/Output Summary (Last 24 hours) at 02/15/2022 1210 Last data filed at 03/13/2022 0600 Gross per 24 hour  Intake 740 ml  Output 1500 ml  Net -760 ml    Filed Weights   02/17/22 0246 02/28/2022 0616 02/21/2022 0803  Weight: 48.7 kg 49.5 kg 47.5 kg    Examination:  General exam: NAD. Respiratory system: Decreased breath sounds on the left.  Fair air movement on the right.  No wheezing.  Right chest tube in place. Cardiovascular system: Regular rate rhythm no murmurs rubs or gallops.  No JVD.  No lower extremity edema.  Gastrointestinal system: Abdomen is nondistended, soft and some tenderness to palpation in the mid to lower abdominal region.  Positive bowel sounds. Central nervous system: Alert and oriented. No focal neurological deficits. Extremities: Symmetric 5 x 5 power. Skin: No rashes, lesions or ulcers Psychiatry: Judgement and insight appear normal. Mood & affect appropriate.     Data Reviewed: I have personally reviewed following labs and imaging studies  CBC: Recent Labs  Lab 02/16/22 0318 02/17/22 0427 02/18/22 0056 02/19/22 0944 03/09/2022 0053  WBC 95.3* 73.1* 81.1* 82.6* 85.7*  NEUTROABS 11.4* 5.3 5.4 5.8 6.7  HGB 12.8 10.8* 11.1* 11.5* 11.1*  HCT 39.9 33.8* 34.7* 36.3 35.9*  MCV 89.5 89.7 89.2 91.4 92.1  PLT 190  181 147* 161 181 211  Basic Metabolic Panel: Recent Labs  Lab 02/15/22 0153 02/15/22 0319 02/15/22 0522 02/16/22 8280 02/17/22 0349 02/18/22 0056 02/19/22 0944 02/19/2022 0053  NA  --   --    < > 144 147* 140 135 137  K  --   --    < > 4.0 3.7 3.9 4.9 4.8  CL  --   --    < > 110 113* 113* 106 103  CO2  --   --    < > $R'27 26 24 25 29  'gA$ GLUCOSE  --   --    < > 103* 73 145* 367* 239*  BUN  --   --    < > 25* 26* 28* 21 24*   CREATININE 0.85  --    < > 0.90 0.87 0.80 0.71 0.69  CALCIUM  --   --    < > 8.2* 8.1* 7.8* 7.7* 7.8*  MG  --  2.1  --   --   --   --  2.1  --   PHOS 4.3  --   --   --   --   --   --   --    < > = values in this interval not displayed.     GFR: Estimated Creatinine Clearance: 55.4 mL/min (by C-G formula based on SCr of 0.69 mg/dL).  Liver Function Tests: Recent Labs  Lab 02/15/22 0522 02/16/22 0318 02/17/22 0427 02/18/22 0056 02/19/22 0944  AST 51* $Remov'31 23 21 24  'nWqfxX$ ALT 32 $Remo'25 20 16 18  'zmFDj$ ALKPHOS 119 90 69 79 77  BILITOT 0.8 1.0 1.4* 0.5 0.3  PROT 5.3* 4.8* 4.3* 4.7* 4.8*  ALBUMIN 3.0* 2.3* 1.9* 2.1* 2.0*     CBG: Recent Labs  Lab 02/19/22 0753 02/19/22 1244 02/19/22 1624 02/19/22 2128 02/18/2022 0755  GLUCAP 340* 341* 324* 277* 132*      Recent Results (from the past 240 hour(s))  Culture, blood (routine x 2)     Status: None   Collection Time: 02/20/2022  7:10 PM   Specimen: BLOOD RIGHT FOREARM  Result Value Ref Range Status   Specimen Description BLOOD RIGHT FOREARM BOTTLES DRAWN AEROBIC ONLY  Final   Special Requests   Final    Blood Culture results may not be optimal due to an inadequate volume of blood received in culture bottles   Culture   Final    NO GROWTH 5 DAYS Performed at [facility], [address] [place], [place] [address]    Report Status 02/19/2022 FINAL  Final  Culture, blood (routine x 2)     Status: None   Collection Time: 02/22/2022  7:42 PM   Specimen: BLOOD RIGHT ARM  Result Value Ref Range Status   Specimen Description BLOOD RIGHT ARM BOTTLES DRAWN AEROBIC ONLY  Final   Special Requests Blood Culture adequate volume  Final   Culture   Final    NO GROWTH 5 DAYS Performed at [facility], [address], [place], [place] [address]    Report Status 02/19/2022 FINAL  Final  MRSA Next Gen by PCR, Nasal     Status: None   Collection Time: 02/15/22  1:20 AM   Specimen: Nasal Mucosa; Nasal Swab  Result Value Ref Range Status   MRSA by PCR  Next Gen NOT DETECTED NOT DETECTED Final    Comment: (NOTE) The GeneXpert MRSA Assay (FDA approved for NASAL specimens only), is one component of a comprehensive MRSA colonization surveillance program. It is  not intended to diagnose MRSA infection nor to guide or monitor treatment for MRSA infections. Test performance is not FDA approved in patients less than 35 years old. Performed at [facility], [place] [address], [place], [place] [address]   Urine Culture     Status: None   Collection Time: 02/15/22  1:53 AM   Specimen: Urine, Clean Catch  Result Value Ref Range Status   Specimen Description URINE, CLEAN CATCH  Final   Special Requests NONE  Final   Culture   Final    NO GROWTH Performed at [facility], [place] [address] [place], [place] [address]    Report Status 02/16/2022 FINAL  Final  Body fluid culture w Gram Stain     Status: None   Collection Time: 02/15/22  1:45 PM   Specimen: Pleura; Body Fluid  Result Value Ref Range Status   Specimen Description PLEURAL  Final   Special Requests NONE  Final   Gram Stain   Final    FEW WBC PRESENT, PREDOMINANTLY MONONUCLEAR NO ORGANISMS SEEN    Culture   Final    NO GROWTH 3 DAYS Performed at [facility], [address], [place], [place] [address]    Report Status 02/18/2022 FINAL  Final  Culture, body fluid w Gram Stain-bottle     Status: None (Preliminary result)   Collection Time: 02/15/22  4:30 PM   Specimen: Pleura  Result Value Ref Range Status   Specimen Description PLEURAL FLUID RIGHT LUNG  Final   Special Requests NONE  Final   Culture   Final    NO GROWTH 4 DAYS Performed at [place] [address], [place], [place] [address]    Report Status PENDING  Incomplete  Gram stain     Status: None   Collection Time: 02/15/22  4:30 PM   Specimen: Pleura  Result Value Ref Range Status   Specimen Description PLEURAL FLUID RIGHT LUNG  Final   Special Requests NONE  Final   Gram Stain   Final     NO SQUAMOUS EPITHELIAL CELLS SEEN FEW WBC SEEN NO ORGANISMS SEEN Performed at [facility], [address] [place], [place] [address]    Report Status 02/15/2022 FINAL  Final         Radiology Studies: DG CHEST PORT 1 VIEW  Result Date: 02/19/2022 CLINICAL DATA:  Colles thorax.  Chest tube follow-up. EXAM: PORTABLE CHEST 1 VIEW COMPARISON:  02/17/2022 FINDINGS: Thoracostomy tube in the lower lateral right chest remains in place. No pleural air. No visible pleural fluid on the right. Increasing amount of pleural fluid on the left, with complete collapse of the left lower lobe and subtotal collapse of the left upper lobe. Central line has been removed. IMPRESSION: Persistent right pleural catheter. No right pleural air or visible pleural fluid. Enlarging volume of pleural fluid on the left, now with complete collapse of the left lower lobe and subtotal collapse of the left upper lobe. Electronically Signed   By: Nelson Chimes M.D.   On: 02/19/2022 07:25   [place] CORE BIOPSY (LYMPH NODES)  Result Date: 02/18/2022 INDICATION: 61 year old female with history CLL presenting multifocal bilateral axillary masses. EXAM: Ultrasound-guided axillary lymph node biopsy MEDICATIONS: None. ANESTHESIA/SEDATION: None. FLUOROSCOPY TIME:  None. COMPLICATIONS: None immediate. PROCEDURE: Informed written consent was obtained from the patient after a thorough discussion of the procedural risks, benefits and alternatives. All questions were addressed. Maximal Sterile Barrier Technique was  utilized including caps, mask, sterile gowns, sterile gloves, sterile drape, hand hygiene and skin antiseptic. A timeout was performed prior to the initiation of the procedure. Preprocedure ultrasound evaluation demonstrated a prominent, subcutaneous heterogeneously hypoechoic enlarged lymph node measuring approximately 3.3 x 1.9 cm in maximum longitudinal dimensions. The procedure was planned. The right axilla was prepped and  draped in standard fashion. Subdermal Local anesthesia was administered with 1% lidocaine at the planned needle entry site. A small skin nick was made. Under direct ultrasound visualization, a 17 gauge introducer needle was directed to the periphery of the lymph node. Next, a total of 4, 18 gauge core biopsies were obtained. Two samples were placed in saline and 2 samples were placed in formalin. The needle was removed. Postprocedure ultrasound demonstrated no evidence of surrounding hematoma or other complicating features. Hemostasis was achieved with brief manual compression. A sterile bandage was applied. The patient tolerated the procedure well without complication. The patient was transferred back to the floor in stable condition. IMPRESSION: Technically successful ultrasound-guided right axillary lymph node biopsy. Ruthann Cancer, MD [facility] Electronically Signed   By: Ruthann Cancer M.D.   On: 02/18/2022 17:53        Scheduled Meds:  chlorhexidine gluconate (MEDLINE KIT)  15 mL Mouth Rinse BID   docusate sodium  100 mg Oral BID   enoxaparin (LOVENOX) injection  40 mg Subcutaneous Q24H   feeding supplement  1 Container Oral TID BM   insulin aspart  0-9 Units Subcutaneous TID WC   insulin glargine-yfgn  8 Units Subcutaneous Daily   multivitamin with minerals  1 tablet Oral Daily   polyethylene glycol  17 g Oral Daily   predniSONE  50 mg Oral Q breakfast   sodium chloride flush  10-40 mL Intracatheter Q12H   Continuous Infusions:  sodium chloride 10 mL/hr at 02/17/22 0800     LOS: 6 days    Time spent: 35 minutes    Irine Seal, MD [facility]   To contact the attending provider between 7A-7P or the covering provider during after hours 7P-7A, please log into the web site [email or web address] and access using universal [place] password for that web site. If you do not have the password, please call the hospital  operator.  03/11/2022, 12:10 PM

## 2022-02-21 ENCOUNTER — Inpatient Hospital Stay (HOSPITAL_COMMUNITY): Payer: Medicaid Other

## 2022-02-21 ENCOUNTER — Encounter (HOSPITAL_COMMUNITY): Payer: Self-pay | Admitting: Pulmonary Disease

## 2022-02-21 DIAGNOSIS — J90 Pleural effusion, not elsewhere classified: Secondary | ICD-10-CM | POA: Diagnosis not present

## 2022-02-21 DIAGNOSIS — C9110 Chronic lymphocytic leukemia of B-cell type not having achieved remission: Secondary | ICD-10-CM | POA: Diagnosis not present

## 2022-02-21 DIAGNOSIS — A419 Sepsis, unspecified organism: Secondary | ICD-10-CM | POA: Diagnosis not present

## 2022-02-21 DIAGNOSIS — E43 Unspecified severe protein-calorie malnutrition: Secondary | ICD-10-CM

## 2022-02-21 DIAGNOSIS — I5032 Chronic diastolic (congestive) heart failure: Secondary | ICD-10-CM | POA: Diagnosis not present

## 2022-02-21 LAB — GLUCOSE, CAPILLARY
Glucose-Capillary: 90 mg/dL (ref 70–99)
Glucose-Capillary: 154 mg/dL — ABNORMAL HIGH (ref 70–99)
Glucose-Capillary: 231 mg/dL — ABNORMAL HIGH (ref 70–99)
Glucose-Capillary: 222 mg/dL — ABNORMAL HIGH (ref 70–99)
Glucose-Capillary: 177 mg/dL — ABNORMAL HIGH (ref 70–99)

## 2022-02-21 LAB — CBC WITH DIFFERENTIAL/PLATELET
Abs Immature Granulocytes: 0.3 10*3/uL — ABNORMAL HIGH (ref 0.00–0.07)
Basophils Absolute: 0.1 10*3/uL (ref 0.0–0.1)
Basophils Relative: 0 %
Eosinophils Absolute: 0.1 10*3/uL (ref 0.0–0.5)
Eosinophils Relative: 0 %
HCT: 37.3 % (ref 36.0–46.0)
Hemoglobin: 11.8 g/dL — ABNORMAL LOW (ref 12.0–15.0)
Immature Granulocytes: 0 %
Lymphocytes Relative: 93 %
Lymphs Abs: 83.3 10*3/uL — ABNORMAL HIGH (ref 0.7–4.0)
MCH: 28.9 pg (ref 26.0–34.0)
MCHC: 31.6 g/dL (ref 30.0–36.0)
MCV: 91.2 fL (ref 80.0–100.0)
Monocytes Absolute: 0.6 10*3/uL (ref 0.1–1.0)
Monocytes Relative: 1 %
Neutro Abs: 5.5 10*3/uL (ref 1.7–7.7)
Neutrophils Relative %: 6 %
Platelets: 269 10*3/uL (ref 150–400)
RBC: 4.09 MIL/uL (ref 3.87–5.11)
RDW: 17.5 % — ABNORMAL HIGH (ref 11.5–15.5)
Smear Review: NORMAL
WBC: 89.8 10*3/uL (ref 4.0–10.5)
nRBC: 0 % (ref 0.0–0.2)

## 2022-02-21 LAB — COMPREHENSIVE METABOLIC PANEL
ALT: 20 U/L (ref 0–44)
AST: 27 U/L (ref 15–41)
Albumin: 1.8 g/dL — ABNORMAL LOW (ref 3.5–5.0)
Alkaline Phosphatase: 77 U/L (ref 38–126)
Anion gap: 7 (ref 5–15)
BUN: 22 mg/dL (ref 8–23)
CO2: 28 mmol/L (ref 22–32)
Calcium: 8 mg/dL — ABNORMAL LOW (ref 8.9–10.3)
Chloride: 103 mmol/L (ref 98–111)
Creatinine, Ser: 0.55 mg/dL (ref 0.44–1.00)
GFR, Estimated: >60 mL/min (ref >60)
Glucose, Bld: 93 mg/dL (ref 70–99)
Potassium: 5.2 mmol/L — ABNORMAL HIGH (ref 3.5–5.1)
Sodium: 138 mmol/L (ref 135–145)
Total Bilirubin: 0.1 mg/dL — ABNORMAL LOW (ref 0.3–1.2)
Total Protein: 4.5 g/dL — ABNORMAL LOW (ref 6.5–8.1)

## 2022-02-21 LAB — CHOLESTEROL, BODY FLUID: Cholesterol, Fluid: 92 mg/dL

## 2022-02-21 LAB — FISH HES LEUKEMIA, 4Q12 REA
Cells Analyzed: 100
Cells Counted:: 100

## 2022-02-21 LAB — MAGNESIUM: Magnesium: 1.9 mg/dL (ref 1.7–2.4)

## 2022-02-21 LAB — CYTOLOGY - NON PAP

## 2022-02-21 LAB — FLOW CYTOMETRY

## 2022-02-21 NOTE — Progress Notes (Signed)
NAME:  [name], MRN:  [id number], DOB:  11/07/1960, LOS: 7 ADMISSION DATE:  03/04/2022, CONSULTATION DATE:  6/2 REFERRING MD:  Dr. Alvino Chapel, CHIEF COMPLAINT:  Unresponsive; Acute resp failure   History of Present Illness:  Patient is a 61 year old female with pertinent PMH of CLL, depression, anxiety, HLD, HTN presents to [facility] ED on 6/2 unresponsive.  On 6/2 patient called EMS for abdominal pain.  Patient initially was alert and talking but became unresponsive with AMS.  Patient was hypoxic with agonal breathing and was placed on NRB.  Patient was noted to be hypotensive.  Patient transported to [facility] ED.  Upon arrival to [facility] ED patient remained unresponsive with agonal respirations.  Patient intubated for airway protection and hypoxia.  BP initially 81/65.  Given IV fluids.  Started on levo.  Fever 100.9 F and WBC 104.4.  Started on cefepime, Flagyl, Vanco.  Cultures pending.  Glucose 198, BNP 534, troponin 109 then 112, LA 4.4 then 1.8, UA unremarkable, UDS negative.  CXR with large left pleural effusion and small right pleural effusion.  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  CT abdomen with ascending colitis constipation, small intestine wall thickening possibly related to gastroenteritis; small volume ascites; extensive abdominopelvic and inguinal adenopathy.  PCCM consulted for ICU admission and transfer to [facility].  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 pylori infection 2/09   s/p prevpac   HAND PAIN, BILATERAL 01/29/2009   Qualifier: Diagnosis of  By: Moshe Cipro MD, Margaret     Hypertension    Iron deficiency 11/17/2016    Lymphadenopathy 01/17/2016   Medical non-compliance 08/22/2017   Nicotine addiction    Psychosis ([place])    social worker states that patient does not like to talk about this and will stop her meds if you try and discuss this with her.   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 CT chest and abd: 1. Extensive abdominopelvic and inguinal adenopathy, most likely related to lymphoma/leukemia. Numerous small hypodense splenic lesions versus bolus phase phenomenon. Intrahepatic periportal edema which is probably either due to fluid overload or hepatic dysfunction. The portal vein is patent within normal caliber limits. Chronic calcific pancreatitis but no findings of acute pancreatitis or ductal dilatation.  Ascending colitis versus nondistention, remainder of the colon with constipation. Diffuse small intestinal wall thickening with gastric fold thickening, could relate to gastroenteritis or hepatic dysfunction versus reactive from ascites versus congestive or infiltrating disease. 6/3 right chest tube placed. Left thora 2000 ml removed.  6/4 extubated. ollowing commands. Lots of milky pleural fluid removed yesterday - exudative, cx negative, lymphocytic. 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 Off pressors 6/6 Transferred to [facility] 6/6 ultrasound-guided right axillary lymph node biopsy -Started on pulse dose steroids at 50 mg prednisone 6/7 chest tube output over a liter 6/8 left thoracentesis for 1400 cc of fluid  Interim History /  Subjective:   Chest tube output still significant on the right, only 50 cc documented overnight Denies significant symptoms States breathing feels stabile  Objective   Blood pressure 103/76, pulse 90, temperature 98.1  F (36.7 C), resp. rate 17, height '5\' 5"'$  (1.651 m), weight 47.5 kg, last menstrual period 07/25/2016, SpO2 100 %.        Intake/Output Summary (Last 24 hours) at 02/21/2022 0809 Last data filed at 02/21/2022 0600 Gross per 24 hour  Intake 500 ml  Output 2050 ml  Net -1550 ml   Filed Weights   02/17/22 0246 02/19/2022 0616 02/27/2022 0803  Weight: 48.7 kg 49.5 kg 47.5 kg   Physical Exam: General: Elderly, frail HENT: Moist oral mucosa Eyes: Anicteric, no Apollo Respiratory: Fair air entry bilaterally Cardiovascular: S1-S2 appreciated Extremities: No extremity tenderness Neuro: AAO x4, CNII-XII grossly intact Psych: Normal mood, normal affect   Resolved Hospital Problem list   Acute encephalopathy (prb 2/2 hypercarbia)   Assessment & Plan:  Principal Problem:   Sepsis ([place]) Active Problems:   Acute respiratory failure with hypoxia (HCC)   CLL (chronic lymphocytic leukemia) (HCC)   Shock (HCC)   Bilateral pleural effusion   Alteration in electrolyte and fluid balance   Chronic diastolic heart failure (HCC)   DNR (do not resuscitate)   Protein-calorie malnutrition, severe   Pressure injury of skin  Acute respiratory failure with hypoxia Bilateral chylothorax -Chylothorax likely related to significant adenopathy -On steroids at 1 mg/KG, currently on 50 mg of prednisone daily -Continue to wean off oxygen supplementation  Keep right-sided chest tube to -20 suction -Continue tube flushes  Left effusion -Post thoracentesis with 1400 cc fluid yesterday 6/8  CLL  Diastolic heart failure grade 2 Hypertension Hyperlipidemia Acute respiratory failure with hypoxia Bilateral chylothorax -Chylothorax likely related to significant adenopathy -Bulky adenopathy in the mediastinum -On pulse dose steroids at 1 mg per kg, on 50 mg of prednisone daily -Continue weaning oxygen supplementation  Chest tube remains to suction -20 -Continue chest tube flushes  Worsening effusion on  the left -Patient is scheduled for ultrasound-guided thoracentesis today  CLL/small lymphocytic lymphoma -Oncology continues to follow  Diastolic heart failure grade 2 Hypertension Hyperlipidemia  Sepsis -Completed course of antibiotics  Chylothorax -Right chest tube in place -Will plan to discontinue chest tube if less than 100 cc of fluid drained per day  Severe malnutrition secondary to chronic illness  Patient remains DNR  Follow-up on chest x-ray today for pneumothorax on the right side  Best Practice (right click and "Reselect all SmartList Selections" daily)   Diet/type: Regular consistency (see orders) DVT prophylaxis: LMWH GI prophylaxis: N/A Lines: N/A Foley:  N/A Code Status:  DNR Last date of multidisciplinary goals of care discussion [6/3 spoke with daughter at bedside plan for one way extubation. ]  Sherrilyn Rist, MD [place] P[facility]M Pager: See Shea Evans

## 2022-02-21 NOTE — Progress Notes (Signed)
PROGRESS NOTE    [name]  UDJ:[id number] DOB: 1960/11/04 DOA: 03/12/2022 PCP: Noreene Larsson, NP    Chief Complaint  Patient presents with   unresponsive    Brief Narrative:  Patient is a 61 year old female with pertinent PMH of CLL, depression, anxiety, HLD, HTN presents to [facility] ED on 6/2 unresponsive.  On 6/2 patient called EMS for abdominal pain.  Patient initially was alert and talking but became unresponsive with AMS.  Patient was hypoxic with agonal breathing and was placed on NRB.  Patient was noted to be hypotensive.  Patient transported to [facility] ED.  Upon arrival to [facility] ED patient remained unresponsive with agonal respirations.  Patient intubated for airway protection and hypoxia.  BP initially 81/65.  Given IV fluids.  Started on levo.  Fever 100.9 F and WBC 104.4.  Started on cefepime, Flagyl, Vanco.  Cultures pending.  Glucose 198, BNP 534, troponin 109 then 112, LA 4.4 then 1.8, UA unremarkable, UDS negative.  CXR with large left pleural effusion and small right pleural effusion.  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  CT abdomen with ascending colitis constipation, small intestine wall thickening possibly related to gastroenteritis; small volume ascites; extensive abdominopelvic and inguinal adenopathy.  PCCM consulted for ICU admission and transfer to [facility].   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 -Patient noted to  have bulky adenopathy in the mediastinum status post axillary lymph node biopsy. -Patient started on pulse dose steroids on 1 mg/kg-50 mg of prednisone started. -Status post tube placement on the right being managed per PCCM. -Chest x-ray with worsening left-sided pleural effusion and patient underwent ultrasound-guided thoracentesis by PCCM on 02/14/2022 with 1400 cc of fluid removed with studies pending.   -Per PCCM.  2.  CLL/SLL -Status post axillary lymph node biopsy with biopsy results consistent with CLL/SLL.. -Patient started on pulse dose steroids. -Per oncology.  3.  Chronic diastolic CHF grade 2/hypertension/hyperlipidemia -ACE inhibitor and diuretic currently on hold. -BP stable. -No signs of overt volume overload on examination. -Follow.  4.  Leukocytosis -Likely multifactorial secondary to CLL, pulse dose steroids. -Oncology following and arranging outpatient follow-up and further work-up in the outpatient setting. -Follow.  5.  Shock/hypotension/?  Sepsis -Resolved.   -Questionable etiology patient treated as a sepsis with no obvious source.   -Status post 5 days antibiotics. -No further antibiotics needed  6.  Hyperglycemia -Likely secondary to steroids. -Hemoglobin A1c 6.5. -CBG 154 this morning. -Continue Semglee to 8 units daily.   -SSI.  -Diabetes coordinator following.  7.  Pressure injury, POA Pressure Injury 02/17/22 Sacrum Medial Stage 1 -  Intact skin with non-blanchable redness of a localized area usually over a bony prominence. (Active)  02/17/22 0745  Location: Sacrum  Location Orientation: Medial  Staging: Stage 1 -  Intact skin with non-blanchable redness of a localized area usually over a bony prominence.  Wound Description (Comments):   Present on Admission: No  DVT prophylaxis: Lovenox Code Status: DNR Family Communication: Updated patient.  No family at bedside. Disposition: TBD  Status is: Inpatient Remains inpatient  appropriate because: Severity of illness.   Consultants:  Interventional radiology: Dr. Vernard Gambles 02/17/2022 General surgery: Dr. Dema Severin 02/17/2022 Oncology: Dr. Lorenso Courier 02/16/2022    [facility] [facility] Events: Including procedures, antibiotic start and stop dates in addition to other pertinent events   6/2: Patient admitted to [facility] ED unresponsive; intubated; shock started on levo; transferred to [facility]. CT chest and abd: 1. Extensive abdominopelvic and inguinal adenopathy, most likely related to lymphoma/leukemia. Numerous small hypodense splenic lesions versus bolus phase phenomenon. Intrahepatic periportal edema which is probably either due to fluid overload or hepatic dysfunction. The portal vein is patent within normal caliber limits. Chronic calcific pancreatitis but no findings of acute pancreatitis or ductal dilatation.  Ascending colitis versus nondistention, remainder of the colon with constipation. Diffuse small intestinal wall thickening with gastric fold thickening, could relate to gastroenteritis or hepatic dysfunction versus reactive from ascites versus congestive or infiltrating disease. 6/3 right chest tube placed. Left thora 2000 ml removed.  6/4 extubated. ollowing commands. Lots of milky pleural fluid removed yesterday - exudative, cx negative, lymphocytic. 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 Off pressors 6/6 Transferred to [facility]       Procedures:  CT head 03/08/2022 CT angiogram chest 03/01/2022 CT abdomen and pelvis 03/02/2022 Chest x-ray 03/10/2022, 02/15/2022, 02/18/2022, 02/17/2022, 02/19/2022 2D echo 02/15/2022 Right axillary core lymph node biopsy 02/18/2022 Ultrasound-guided thoracentesis 03/07/2022  Antimicrobials:  IV cefepime 02/19/2022>>>> 02/18/2022 IV Flagyl 02/16/2022>>>> 02/18/2022 IV vancomycin 02/18/2022>>> 02/15/2022   Subjective: Patient sitting up in bed.  Denies any chest pain.  Denies any significant shortness of breath.  Underwent thoracentesis  on 03/13/2022.  Tolerating current diet.  Still with some abdominal discomfort that she states has been ongoing for a while.  Objective: Vitals:   02/14/2022 1624 02/19/2022 2113 02/21/22 0458 02/21/22 0800  BP: 111/84 102/71 116/82 103/76  Pulse: (!) 54 (!) 102 87 90  Resp: $Remo'18 18 19 17  'vyKhy$ Temp: 98.4 F (36.9 C) 98.7 F (37.1 C) 98.3 F (36.8 C) 98.1 F (36.7 C)  TempSrc: Oral Oral Oral   SpO2: (!) 86% 96% 97% 100%  Weight:      Height:        Intake/Output Summary (Last 24 hours) at 02/21/2022 1311 Last data filed at 02/21/2022 0600 Gross per 24 hour  Intake 500 ml  Output 2050 ml  Net -1550 ml    Filed Weights   02/17/22 0246 03/07/2022 0616 02/17/2022 0803  Weight: 48.7 kg 49.5 kg 47.5 kg    Examination:  General exam: NAD. Respiratory system: Improved basilar breath sounds on the left.  Fair air movement on the right.  No wheezing.  Right chest tube in place. Cardiovascular system: Regular rate rhythm no murmurs rubs or gallops.  No JVD.  No lower extremity edema. Gastrointestinal system: Abdomen is soft, nontender, positive bowel sounds, some tenderness to palpation in the mid to lower abdominal region.  Positive bowel sounds. Central nervous system: Alert and oriented. No focal neurological deficits. Extremities: Symmetric 5 x 5 power. Skin: No rashes, lesions or ulcers Psychiatry: Judgement and insight appear normal. Mood & affect appropriate.     Data Reviewed: I have personally reviewed following labs and imaging studies  CBC: Recent Labs  Lab 02/17/22 0427 02/18/22 0056 02/19/22 0944 02/23/2022 0053 02/21/22 0710  WBC 73.1* 81.1* 82.6* 85.7* 89.8*  NEUTROABS 5.3 5.4 5.8  6.7 5.5  HGB 10.8* 11.1* 11.5* 11.1* 11.8*  HCT 33.8* 34.7* 36.3 35.9* 37.3  MCV 89.7 89.2 91.4 92.1 91.2  PLT 147* 161 181 211 269     Basic Metabolic Panel: Recent Labs  Lab 02/15/22 0153 02/15/22 0319 02/15/22 0522 02/17/22 0427 02/18/22 0056 02/19/22 0944 02/18/2022 0053  02/21/22 0710  NA  --   --    < > 147* 140 135 137 138  K  --   --    < > 3.7 3.9 4.9 4.8 5.2*  CL  --   --    < > 113* 113* 106 103 103  CO2  --   --    < > $R'26 24 25 29 28  'Yb$ GLUCOSE  --   --    < > 73 145* 367* 239* 93  BUN  --   --    < > 26* 28* 21 24* 22  CREATININE 0.85  --    < > 0.87 0.80 0.71 0.69 0.55  CALCIUM  --   --    < > 8.1* 7.8* 7.7* 7.8* 8.0*  MG  --  2.1  --   --   --  2.1  --  1.9  PHOS 4.3  --   --   --   --   --   --   --    < > = values in this interval not displayed.     GFR: Estimated Creatinine Clearance: 55.4 mL/min (by C-G formula based on SCr of 0.55 mg/dL).  Liver Function Tests: Recent Labs  Lab 02/16/22 0318 02/17/22 0427 02/18/22 0056 02/19/22 0944 02/21/22 0710  AST $Re'31 23 21 24 27  'LIn$ ALT $R'25 20 16 18 20  'UR$ ALKPHOS 90 69 79 77 77  BILITOT 1.0 1.4* 0.5 0.3 0.1*  PROT 4.8* 4.3* 4.7* 4.8* 4.5*  ALBUMIN 2.3* 1.9* 2.1* 2.0* 1.8*     CBG: Recent Labs  Lab 02/22/2022 1208 02/15/2022 1623 02/19/2022 2050 02/21/22 0805 02/21/22 1115  GLUCAP 186* 136* 191* 90 154*      Recent Results (from the past 240 hour(s))  Culture, blood (routine x 2)     Status: None   Collection Time: 03/05/2022  7:10 PM   Specimen: BLOOD RIGHT FOREARM  Result Value Ref Range Status   Specimen Description BLOOD RIGHT FOREARM BOTTLES DRAWN AEROBIC ONLY  Final   Special Requests   Final    Blood Culture results may not be optimal due to an inadequate volume of blood received in culture bottles   Culture   Final    NO GROWTH 5 DAYS Performed at [facility], [address], [place], [place] [address]    Report Status 02/19/2022 FINAL  Final  Culture, blood (routine x 2)     Status: None   Collection Time: 02/19/2022  7:42 PM   Specimen: BLOOD RIGHT ARM  Result Value Ref Range Status   Specimen Description BLOOD RIGHT ARM BOTTLES DRAWN AEROBIC ONLY  Final   Special Requests Blood Culture adequate volume  Final   Culture   Final    NO GROWTH 5 DAYS Performed at [facility], [address] [place], [place] [address]    Report Status 02/19/2022 FINAL  Final  MRSA Next Gen by PCR, Nasal     Status: None   Collection Time: 02/15/22  1:20 AM   Specimen: Nasal Mucosa; Nasal Swab  Result Value Ref Range Status   MRSA by PCR Next Gen  NOT DETECTED NOT DETECTED Final    Comment: (NOTE) The GeneXpert MRSA Assay (FDA approved for NASAL specimens only), is one component of a comprehensive MRSA colonization surveillance program. It is not intended to diagnose MRSA infection nor to guide or monitor treatment for MRSA infections. Test performance is not FDA approved in patients less than 48 years old. Performed at [facility], [place] [address], [place], [place] [address]   Urine Culture     Status: None   Collection Time: 02/15/22  1:53 AM   Specimen: Urine, Clean Catch  Result Value Ref Range Status   Specimen Description URINE, CLEAN CATCH  Final   Special Requests NONE  Final   Culture   Final    NO GROWTH Performed at [facility], [place] [address], [place], [place] [address]    Report Status 02/16/2022 FINAL  Final  Body fluid culture w Gram Stain     Status: None   Collection Time: 02/15/22  1:45 PM   Specimen: Pleura; Body Fluid  Result Value Ref Range Status   Specimen Description PLEURAL  Final   Special Requests NONE  Final   Gram Stain   Final    FEW WBC PRESENT, PREDOMINANTLY MONONUCLEAR NO ORGANISMS SEEN    Culture   Final    NO GROWTH 3 DAYS Performed at [facility], [address] [place], [place] [address]    Report Status 02/18/2022 FINAL  Final  Culture, body fluid w Gram Stain-bottle     Status: None   Collection Time: 02/15/22  4:30 PM   Specimen: Pleura  Result Value Ref Range Status   Specimen Description PLEURAL FLUID RIGHT LUNG  Final   Special Requests NONE  Final   Culture   Final    NO GROWTH 5 DAYS Performed at [facility] [address], [place], [place] [address]    Report Status 02/13/2022  FINAL  Final  Gram stain     Status: None   Collection Time: 02/15/22  4:30 PM   Specimen: Pleura  Result Value Ref Range Status   Specimen Description PLEURAL FLUID RIGHT LUNG  Final   Special Requests NONE  Final   Gram Stain   Final    NO SQUAMOUS EPITHELIAL CELLS SEEN FEW WBC SEEN NO ORGANISMS SEEN Performed at [facility], [place] [address], [place], [place] [address]    Report Status 02/15/2022 FINAL  Final  Body fluid culture w Gram Stain     Status: None (Preliminary result)   Collection Time: 02/28/2022  2:35 PM   Specimen: Pleural Fluid  Result Value Ref Range Status   Specimen Description PLEURAL  Final   Special Requests NONE  Final   Gram Stain   Final    FEW WBC PRESENT, PREDOMINANTLY PMN NO ORGANISMS SEEN    Culture   Final    NO GROWTH < 24 HOURS Performed at [facility], [place] [address], [place], [place] [address]    Report Status PENDING  Incomplete         Radiology Studies: DG Chest Port 1 View  Result Date: 02/21/2022 CLINICAL DATA:  Pneumothorax EXAM: PORTABLE CHEST 1 VIEW COMPARISON:  02/19/2022 FINDINGS: Unchanged AP portable chest radiograph with a small, less than 10% right apical pneumothorax. Unchanged left greater than right layering bilateral pleural effusions and diffuse bilateral interstitial opacity. Heart and mediastinum are unremarkable. IMPRESSION: 1. Unchanged AP portable chest radiograph with a  small, less than 10% right apical pneumothorax. 2. Unchanged left greater than right layering bilateral pleural effusions and diffuse bilateral interstitial opacity, likely edema. Electronically Signed   By: Delanna Ahmadi M.D.   On: 02/21/2022 09:17   DG Chest Port 1 View  Result Date: 02/24/2022 CLINICAL DATA:  Postprocedure. EXAM: PORTABLE CHEST 1 VIEW COMPARISON:  Chest x-ray 02/19/2022.  Chest CT 02/15/2022. FINDINGS: Right pleural drainage catheter is unchanged. There is a small left pleural effusion which has significantly decreased  from prior. There is a small right apical pneumothorax measuring 1 cm from the lung apex. No left-sided pneumothorax visualized. No mediastinal shift. The cardiomediastinal silhouette is within normal limits. The osseous structures are stable. IMPRESSION: 1. Small right apical pneumothorax. Right pleural drainage catheter in place. 2. Small left pleural effusion has decreased from prior. Electronically Signed   By: Ronney Asters M.D.   On: 02/24/2022 16:08        Scheduled Meds:  chlorhexidine gluconate (MEDLINE KIT)  15 mL Mouth Rinse BID   docusate sodium  100 mg Oral BID   enoxaparin (LOVENOX) injection  40 mg Subcutaneous Q24H   feeding supplement  1 Container Oral TID BM   insulin aspart  0-9 Units Subcutaneous TID WC   insulin glargine-yfgn  8 Units Subcutaneous Daily   multivitamin with minerals  1 tablet Oral Daily   polyethylene glycol  17 g Oral Daily   predniSONE  50 mg Oral Q breakfast   sodium chloride flush  10-40 mL Intracatheter Q12H   Continuous Infusions:  sodium chloride 10 mL/hr at 02/17/22 0800     LOS: 7 days    Time spent: 35 minutes    Irine Seal, MD [facility]   To contact the attending provider between 7A-7P or the covering provider during after hours 7P-7A, please log into the web site [email or web address] and access using universal [place] password for that web site. If you do not have the password, please call the hospital operator.  02/21/2022, 1:11 PM

## 2022-02-22 ENCOUNTER — Inpatient Hospital Stay (HOSPITAL_COMMUNITY): Payer: Medicaid Other

## 2022-02-22 DIAGNOSIS — I5032 Chronic diastolic (congestive) heart failure: Secondary | ICD-10-CM | POA: Diagnosis not present

## 2022-02-22 DIAGNOSIS — C9110 Chronic lymphocytic leukemia of B-cell type not having achieved remission: Secondary | ICD-10-CM | POA: Diagnosis not present

## 2022-02-22 DIAGNOSIS — A419 Sepsis, unspecified organism: Secondary | ICD-10-CM | POA: Diagnosis not present

## 2022-02-22 DIAGNOSIS — J90 Pleural effusion, not elsewhere classified: Secondary | ICD-10-CM | POA: Diagnosis not present

## 2022-02-22 LAB — BASIC METABOLIC PANEL
Anion gap: 3 — ABNORMAL LOW (ref 5–15)
BUN: 25 mg/dL — ABNORMAL HIGH (ref 8–23)
CO2: 31 mmol/L (ref 22–32)
Calcium: 7.8 mg/dL — ABNORMAL LOW (ref 8.9–10.3)
Chloride: 102 mmol/L (ref 98–111)
Creatinine, Ser: 0.59 mg/dL (ref 0.44–1.00)
GFR, Estimated: >60 mL/min (ref >60)
Glucose, Bld: 128 mg/dL — ABNORMAL HIGH (ref 70–99)
Potassium: 4.7 mmol/L (ref 3.5–5.1)
Sodium: 136 mmol/L (ref 135–145)

## 2022-02-22 LAB — CBC WITH DIFFERENTIAL/PLATELET
Abs Immature Granulocytes: 1 10*3/uL — ABNORMAL HIGH (ref 0.00–0.07)
Basophils Absolute: 0 10*3/uL (ref 0.0–0.1)
Basophils Relative: 0 %
Eosinophils Absolute: 0 10*3/uL (ref 0.0–0.5)
Eosinophils Relative: 0 %
HCT: 38.7 % (ref 36.0–46.0)
Hemoglobin: 12 g/dL (ref 12.0–15.0)
Lymphocytes Relative: 83 %
Lymphs Abs: 80.3 10*3/uL — ABNORMAL HIGH (ref 0.7–4.0)
MCH: 28.4 pg (ref 26.0–34.0)
MCHC: 31 g/dL (ref 30.0–36.0)
MCV: 91.5 fL (ref 80.0–100.0)
Monocytes Absolute: 1.9 10*3/uL — ABNORMAL HIGH (ref 0.1–1.0)
Monocytes Relative: 2 %
Neutro Abs: 13.6 10*3/uL — ABNORMAL HIGH (ref 1.7–7.7)
Neutrophils Relative %: 14 %
Platelets: 339 10*3/uL (ref 150–400)
Promyelocytes Relative: 1 %
RBC: 4.23 MIL/uL (ref 3.87–5.11)
RDW: 17.4 % — ABNORMAL HIGH (ref 11.5–15.5)
WBC: 96.8 10*3/uL (ref 4.0–10.5)
nRBC: 0 % (ref 0.0–0.2)
nRBC: 0 /100 WBC

## 2022-02-22 LAB — ACID FAST SMEAR (AFB, MYCOBACTERIA): Acid Fast Smear: NEGATIVE

## 2022-02-22 LAB — GLUCOSE, CAPILLARY
Glucose-Capillary: 142 mg/dL — ABNORMAL HIGH (ref 70–99)
Glucose-Capillary: 166 mg/dL — ABNORMAL HIGH (ref 70–99)
Glucose-Capillary: 195 mg/dL — ABNORMAL HIGH (ref 70–99)
Glucose-Capillary: 253 mg/dL — ABNORMAL HIGH (ref 70–99)

## 2022-02-22 MED ORDER — OCTREOTIDE ACETATE 50 MCG/ML IJ SOLN
50.0000 ug | Freq: Every day | INTRAMUSCULAR | Status: DC
  Administered 2022-02-22 – 2022-02-25 (×4): 50 ug via SUBCUTANEOUS
  Filled 2022-02-22 (×5): qty 1

## 2022-02-22 NOTE — Progress Notes (Signed)
PROGRESS NOTE    [name]  NUU:[id number] DOB: 05-17-1961 DOA: 02/22/2022 PCP: Noreene Larsson, NP    Chief Complaint  Patient presents with   unresponsive    Brief Narrative:  Patient is a 61 year old female with pertinent PMH of CLL, depression, anxiety, HLD, HTN presents to [facility] ED on 6/2 unresponsive.  On 6/2 patient called EMS for abdominal pain.  Patient initially was alert and talking but became unresponsive with AMS.  Patient was hypoxic with agonal breathing and was placed on NRB.  Patient was noted to be hypotensive.  Patient transported to [facility] ED.  Upon arrival to [facility] ED patient remained unresponsive with agonal respirations.  Patient intubated for airway protection and hypoxia.  BP initially 81/65.  Given IV fluids.  Started on levo.  Fever 100.9 F and WBC 104.4.  Started on cefepime, Flagyl, Vanco.  Cultures pending.  Glucose 198, BNP 534, troponin 109 then 112, LA 4.4 then 1.8, UA unremarkable, UDS negative.  CXR with large left pleural effusion and small right pleural effusion.  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  CT abdomen with ascending colitis constipation, small intestine wall thickening possibly related to gastroenteritis; small volume ascites; extensive abdominopelvic and inguinal adenopathy.  PCCM consulted for ICU admission and transfer to [facility].   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 -Patient noted to  have bulky adenopathy in the mediastinum status post axillary lymph node biopsy. -Patient started on pulse dose steroids on 1 mg/kg-50 mg of prednisone started. -Status post tube placement on the right being managed per PCCM. -Chest x-ray with worsening left-sided pleural effusion and patient underwent ultrasound-guided thoracentesis by PCCM on 03/11/2022 with 1400 cc of fluid removed with studies pending.  -Repeat chest x-ray with some improvement. -Per PCCM.  2.  CLL/SLL -Status post axillary lymph node biopsy with biopsy results consistent with CLL/SLL.. -Patient started on pulse dose steroids. -Per oncology.  3.  Chronic diastolic CHF grade 2/hypertension/hyperlipidemia -ACE inhibitor and diuretic currently on hold. -BP stable. -No signs of overt volume overload on examination. -Follow.  4.  Leukocytosis -Likely multifactorial secondary to CLL, pulse dose steroids. -Oncology following and arranging outpatient follow-up and further work-up in the outpatient setting. -Follow.  5.  Shock/hypotension/?Sepsis -Resolved.   -Questionable etiology patient treated as a sepsis with no obvious source.   -Status post 5 days antibiotics. -No further antibiotics needed  6.  Hyperglycemia -Likely secondary to steroids. -Hemoglobin A1c 6.5. -CBG 142 this morning. -Continue Semglee to 8 units daily.   -SSI.  -Diabetes coordinator following.  7.  Pressure injury, POA Pressure Injury 02/17/22 Sacrum Medial Stage 1 -  Intact skin with non-blanchable redness of a localized area usually over a bony prominence. (Active)  02/17/22 0745  Location: Sacrum  Location Orientation: Medial  Staging: Stage 1 -  Intact skin with non-blanchable redness of a localized area usually over a bony prominence.  Wound Description (Comments):   Present  on Admission: No       DVT prophylaxis: Lovenox Code Status: DNR Family Communication: Updated patient.  No family at bedside. Disposition: TBD  Status  is: Inpatient Remains inpatient appropriate because: Severity of illness.   Consultants:  Interventional radiology: Dr. Deanne Coffer 02/17/2022 General surgery: Dr. Cliffton Asters 02/17/2022 Oncology: Dr. Leonides Schanz 02/16/2022    Significant Hospital Events: Including procedures, antibiotic start and stop dates in addition to other pertinent events   6/2: Patient admitted to [facility] ED unresponsive; intubated; shock started on levo; transferred to [facility]. CT chest and abd: 1. Extensive abdominopelvic and inguinal adenopathy, most likely related to lymphoma/leukemia. Numerous small hypodense splenic lesions versus bolus phase phenomenon. Intrahepatic periportal edema which is probably either due to fluid overload or hepatic dysfunction. The portal vein is patent within normal caliber limits. Chronic calcific pancreatitis but no findings of acute pancreatitis or ductal dilatation.  Ascending colitis versus nondistention, remainder of the colon with constipation. Diffuse small intestinal wall thickening with gastric fold thickening, could relate to gastroenteritis or hepatic dysfunction versus reactive from ascites versus congestive or infiltrating disease. 6/3 right chest tube placed. Left thora 2000 ml removed.  6/4 extubated. ollowing commands. Lots of milky pleural fluid removed yesterday - exudative, cx negative, lymphocytic. 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 Off pressors 6/6 Transferred to [facility]       Procedures:  CT head 03/05/2022 CT angiogram chest 03/07/2022 CT abdomen and pelvis 02/13/2022 Chest x-ray 02/21/2022, 02/15/2022, 03/05/2022, 02/17/2022, 02/19/2022 2D echo 02/15/2022 Right axillary core lymph node biopsy 02/18/2022 Ultrasound-guided thoracentesis 02/18/2022  Antimicrobials:  IV cefepime 02/23/2022>>>> 02/18/2022 IV Flagyl 02/24/2022>>>> 02/18/2022 IV vancomycin 02/23/2022>>> 02/15/2022   Subjective: Sitting up in bed trying to order her lunch.  Overall states feels a little bit better.   Still with some fatigue.  Does endorse some shortness of breath.  No chest pain.  No abdominal pain.    Objective: Vitals:   02/21/22 2219 02/22/22 0550 02/22/22 0741 02/22/22 1146  BP: 118/80 114/78 112/85 123/79  Pulse: 99 100 (!) 106 (!) 103  Resp: 18 18 17  (!) 30  Temp: 98.8 F (37.1 C) 98.8 F (37.1 C) 98.8 F (37.1 C) 98.2 F (36.8 C)  TempSrc: Oral Oral Oral Oral  SpO2: 99% 95% 95% 95%  Weight:      Height:        Intake/Output Summary (Last 24 hours) at 02/22/2022 1150 Last data filed at 02/22/2022 0600 Gross per 24 hour  Intake 500 ml  Output 650 ml  Net -150 ml    Filed Weights   02/17/22 0246 02/19/2022 0616 02/24/2022 0803  Weight: 48.7 kg 49.5 kg 47.5 kg    Examination:  General exam: NAD. Respiratory system: Some improved basilar breath sounds on the left.  Fair air movement on the right.  Chest tube in place.  Cardiovascular system: RRR no murmurs rubs or gallops.  No JVD.  No lower extremity edema.   Gastrointestinal system: Abdomen is soft, nontender, positive bowel sounds, some tenderness to palpation in the mid to lower abdominal region.  Positive bowel sounds. Central nervous system: Alert and oriented. No focal neurological deficits. Extremities: Symmetric 5 x 5 power. Skin: No rashes, lesions or ulcers Psychiatry: Judgement and insight appear normal. Mood & affect appropriate.     Data Reviewed: I have personally reviewed following labs and imaging studies  CBC: Recent Labs  Lab 02/18/22 0056 02/19/22 0944 03/11/2022 0053 02/21/22 0710 02/22/22 0135  WBC 81.1* 82.6* 85.7* 89.8* 96.8*  NEUTROABS 5.4 5.8 6.7 5.5 13.6*  HGB 11.1* 11.5* 11.1* 11.8* 12.0  HCT 34.7* 36.3 35.9* 37.3 38.7  MCV 89.2 91.4 92.1 91.2 91.5  PLT 161 181 211 269 339     Basic Metabolic Panel: Recent Labs  Lab 02/18/22 0056 02/19/22 0944 02/15/2022 0053 02/21/22 0710 02/22/22 0135  NA 140 135 137 138 136  K 3.9 4.9 4.8 5.2* 4.7  CL 113* 106 103 103 102  CO2 $Re'24  25 29 28 31  'Pvo$ GLUCOSE 145* 367* 239* 93 128*  BUN 28* 21 24* 22 25*  CREATININE 0.80 0.71 0.69 0.55 0.59  CALCIUM 7.8* 7.7* 7.8* 8.0* 7.8*  MG  --  2.1  --  1.9  --      GFR: Estimated Creatinine Clearance: 55.4 mL/min (by C-G formula based on SCr of 0.59 mg/dL).  Liver Function Tests: Recent Labs  Lab 02/16/22 0318 02/17/22 0427 02/18/22 0056 02/19/22 0944 02/21/22 0710  AST $Re'31 23 21 24 27  'ULX$ ALT $R'25 20 16 18 20  'yh$ ALKPHOS 90 69 79 77 77  BILITOT 1.0 1.4* 0.5 0.3 0.1*  PROT 4.8* 4.3* 4.7* 4.8* 4.5*  ALBUMIN 2.3* 1.9* 2.1* 2.0* 1.8*     CBG: Recent Labs  Lab 02/21/22 1115 02/21/22 1609 02/21/22 1656 02/21/22 2217 02/22/22 0738  GLUCAP 154* 231* 222* 177* 142*      Recent Results (from the past 240 hour(s))  Culture, blood (routine x 2)     Status: None   Collection Time: 02/24/2022  7:10 PM   Specimen: BLOOD RIGHT FOREARM  Result Value Ref Range Status   Specimen Description BLOOD RIGHT FOREARM BOTTLES DRAWN AEROBIC ONLY  Final   Special Requests   Final    Blood Culture results may not be optimal due to an inadequate volume of blood received in culture bottles   Culture   Final    NO GROWTH 5 DAYS Performed at [facility], [address] [place], [place] [address]    Report Status 02/19/2022 FINAL  Final  Culture, blood (routine x 2)     Status: None   Collection Time: 02/18/2022  7:42 PM   Specimen: BLOOD RIGHT ARM  Result Value Ref Range Status   Specimen Description BLOOD RIGHT ARM BOTTLES DRAWN AEROBIC ONLY  Final   Special Requests Blood Culture adequate volume  Final   Culture   Final    NO GROWTH 5 DAYS Performed at [facility] [address], [place], [place] [address]    Report Status 02/19/2022 FINAL  Final  MRSA Next Gen by PCR, Nasal     Status: None   Collection Time: 02/15/22  1:20 AM   Specimen: Nasal Mucosa; Nasal Swab  Result Value Ref Range Status   MRSA by PCR Next Gen NOT DETECTED NOT DETECTED Final    Comment: (NOTE) The GeneXpert  MRSA Assay (FDA approved for NASAL specimens only), is one component of a comprehensive MRSA colonization surveillance program. It is not intended to diagnose MRSA infection nor to guide or monitor treatment for MRSA infections. Test performance is not FDA approved in patients less than 76 years old. Performed at [facility], [place] [address] [place], [place] [address]   Urine Culture     Status: None   Collection Time: 02/15/22  1:53 AM   Specimen: Urine, Clean Catch  Result Value Ref Range Status   Specimen Description URINE, CLEAN CATCH  Final   Special Requests NONE  Final   Culture  Final    NO GROWTH Performed at [facility], [place] [address] [place], [place] [address]    Report Status 02/16/2022 FINAL  Final  Body fluid culture w Gram Stain     Status: None   Collection Time: 02/15/22  1:45 PM   Specimen: Pleura; Body Fluid  Result Value Ref Range Status   Specimen Description PLEURAL  Final   Special Requests NONE  Final   Gram Stain   Final    FEW WBC PRESENT, PREDOMINANTLY MONONUCLEAR NO ORGANISMS SEEN    Culture   Final    NO GROWTH 3 DAYS Performed at [facility], [address], [place], [place] [address]    Report Status 02/18/2022 FINAL  Final  Culture, body fluid w Gram Stain-bottle     Status: None   Collection Time: 02/15/22  4:30 PM   Specimen: Pleura  Result Value Ref Range Status   Specimen Description PLEURAL FLUID RIGHT LUNG  Final   Special Requests NONE  Final   Culture   Final    NO GROWTH 5 DAYS Performed at [facility] [address], [place], [place] [address]    Report Status 03/01/2022 FINAL  Final  Gram stain     Status: None   Collection Time: 02/15/22  4:30 PM   Specimen: Pleura  Result Value Ref Range Status   Specimen Description PLEURAL FLUID RIGHT LUNG  Final   Special Requests NONE  Final   Gram Stain   Final    NO SQUAMOUS EPITHELIAL CELLS SEEN FEW WBC SEEN NO ORGANISMS SEEN Performed at [facility], [place] [address], [place], [place] [address]    Report Status 02/15/2022 FINAL  Final  Body fluid culture w Gram Stain     Status: None (Preliminary result)   Collection Time: 02/21/2022  2:35 PM   Specimen: Pleural Fluid  Result Value Ref Range Status   Specimen Description PLEURAL  Final   Special Requests NONE  Final   Gram Stain   Final    FEW WBC PRESENT, PREDOMINANTLY PMN NO ORGANISMS SEEN    Culture   Final    NO GROWTH 2 DAYS Performed at [facility], [place] [address], [place], [place] [address]    Report Status PENDING  Incomplete         Radiology Studies: DG CHEST PORT 1 VIEW  Result Date: 02/22/2022 CLINICAL DATA:  Pleural effusion. EXAM: PORTABLE CHEST 1 VIEW COMPARISON:  Chest radiograph 02/21/2022 FINDINGS: The cardiomediastinal silhouette is grossly unchanged, with the heart remaining partially obscured. Aortic atherosclerosis is noted. A small right apical pneumothorax is unchanged. A pleural catheter projects near the inferior aspect of the right lateral costophrenic sulcus, unchanged. Small right and moderate left pleural effusions appear mildly increased, although this may be partly due to differences in patient positioning. Mild diffuse interstitial opacities and bibasilar airspace opacities have not significantly changed. IMPRESSION: 1. Unchanged small right apical pneumothorax. 2. Mildly increased size of left larger than right pleural effusions. 3. Unchanged interstitial and airspace opacities which may reflect mild edema and atelectasis. Electronically Signed   By: Logan Bores M.D.   On: 02/22/2022 10:21   DG Chest Port 1 View  Result Date: 02/21/2022 CLINICAL DATA:  Pneumothorax EXAM: PORTABLE CHEST 1 VIEW COMPARISON:  03/05/2022 FINDINGS: Unchanged AP portable chest radiograph with a small, less than 10% right apical pneumothorax. Unchanged left greater than right layering bilateral pleural effusions and  diffuse bilateral interstitial opacity.  Heart and mediastinum are unremarkable. IMPRESSION: 1. Unchanged AP portable chest radiograph with a small, less than 10% right apical pneumothorax. 2. Unchanged left greater than right layering bilateral pleural effusions and diffuse bilateral interstitial opacity, likely edema. Electronically Signed   By: Delanna Ahmadi M.D.   On: 02/21/2022 09:17   DG Chest Port 1 View  Result Date: 03/07/2022 CLINICAL DATA:  Postprocedure. EXAM: PORTABLE CHEST 1 VIEW COMPARISON:  Chest x-ray 02/19/2022.  Chest CT 02/15/2022. FINDINGS: Right pleural drainage catheter is unchanged. There is a small left pleural effusion which has significantly decreased from prior. There is a small right apical pneumothorax measuring 1 cm from the lung apex. No left-sided pneumothorax visualized. No mediastinal shift. The cardiomediastinal silhouette is within normal limits. The osseous structures are stable. IMPRESSION: 1. Small right apical pneumothorax. Right pleural drainage catheter in place. 2. Small left pleural effusion has decreased from prior. Electronically Signed   By: Ronney Asters M.D.   On: 03/14/2022 16:08        Scheduled Meds:  chlorhexidine gluconate (MEDLINE KIT)  15 mL Mouth Rinse BID   docusate sodium  100 mg Oral BID   enoxaparin (LOVENOX) injection  40 mg Subcutaneous Q24H   feeding supplement  1 Container Oral TID BM   insulin aspart  0-9 Units Subcutaneous TID WC   insulin glargine-yfgn  8 Units Subcutaneous Daily   multivitamin with minerals  1 tablet Oral Daily   polyethylene glycol  17 g Oral Daily   predniSONE  50 mg Oral Q breakfast   sodium chloride flush  10-40 mL Intracatheter Q12H   Continuous Infusions:  sodium chloride 10 mL/hr at 02/17/22 0800     LOS: 8 days    Time spent: 35 minutes    Irine Seal, MD [facility]   To contact the attending provider between 7A-7P or the covering provider during after hours 7P-7A, please log into the web site [email or web address] and  access using universal [place] password for that web site. If you do not have the password, please call the hospital operator.  02/22/2022, 11:50 AM

## 2022-02-22 NOTE — Progress Notes (Signed)
NAME:  [name], MRN:  [id number], DOB:  Apr 26, 1961, LOS: 8 ADMISSION DATE:  03/08/2022, CONSULTATION DATE:  6/2 REFERRING MD:  Dr. Alvino Chapel, CHIEF COMPLAINT:  Unresponsive; Acute resp failure   History of Present Illness:  Patient is a 61 year old female with pertinent PMH of CLL, depression, anxiety, HLD, HTN presents to [facility] ED on 6/2 unresponsive.  On 6/2 patient called EMS for abdominal pain.  Patient initially was alert and talking but became unresponsive with AMS.  Patient was hypoxic with agonal breathing and was placed on NRB.  Patient was noted to be hypotensive.  Patient transported to [facility] ED.  Upon arrival to [facility] ED patient remained unresponsive with agonal respirations.  Patient intubated for airway protection and hypoxia.  BP initially 81/65.  Given IV fluids.  Started on levo.  Fever 100.9 F and WBC 104.4.  Started on cefepime, Flagyl, Vanco.  Cultures pending.  Glucose 198, BNP 534, troponin 109 then 112, LA 4.4 then 1.8, UA unremarkable, UDS negative.  CXR with large left pleural effusion and small right pleural effusion.  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  CT abdomen with ascending colitis constipation, small intestine wall thickening possibly related to gastroenteritis; small volume ascites; extensive abdominopelvic and inguinal adenopathy.  PCCM consulted for ICU admission and transfer to [facility].  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 pylori infection 2/09   s/p prevpac   HAND PAIN, BILATERAL 01/29/2009   Qualifier: Diagnosis of  By: Moshe Cipro MD, Margaret     Hypertension    Iron deficiency 11/17/2016    Lymphadenopathy 01/17/2016   Medical non-compliance 08/22/2017   Nicotine addiction    Psychosis ([place])    social worker states that patient does not like to talk about this and will stop her meds if you try and discuss this with her.   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 CT chest and abd: 1. Extensive abdominopelvic and inguinal adenopathy, most likely related to lymphoma/leukemia. Numerous small hypodense splenic lesions versus bolus phase phenomenon. Intrahepatic periportal edema which is probably either due to fluid overload or hepatic dysfunction. The portal vein is patent within normal caliber limits. Chronic calcific pancreatitis but no findings of acute pancreatitis or ductal dilatation.  Ascending colitis versus nondistention, remainder of the colon with constipation. Diffuse small intestinal wall thickening with gastric fold thickening, could relate to gastroenteritis or hepatic dysfunction versus reactive from ascites versus congestive or infiltrating disease. 6/3 right chest tube placed. Left thora 2000 ml removed.  6/4 extubated. ollowing commands. Lots of milky pleural fluid removed yesterday - exudative, cx negative, lymphocytic. 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 Off pressors 6/6 Transferred to [facility] 6/6 ultrasound-guided right axillary lymph node biopsy -Started on pulse dose steroids at 50 mg prednisone 6/7 chest tube output over a liter 6/8 left thoracentesis for 1400 cc of fluid  Interim History /  Subjective:   Decreased chest tube output.  She reports that her breathing feels heavier.   Objective   Blood pressure 123/79, pulse (!) 103, temperature 98.2 F (36.8 C), temperature source Oral, resp. rate (!) 30,  height '5\' 5"'$  (1.651 m), weight 47.5 kg, last menstrual period 07/25/2016, SpO2 95 %.        Intake/Output Summary (Last 24 hours) at 02/22/2022 1148 Last data filed at 02/22/2022 0600 Gross per 24 hour  Intake 500 ml  Output 650 ml  Net -150 ml    Filed Weights   02/17/22 0246 03/11/2022 0616 03/02/2022 0803  Weight: 48.7 kg 49.5 kg 47.5 kg   Physical Exam: General: Elderly, frail, cachectic HENT: Moist oral mucosa, Bulky, firm supraclavicular lymphadenopathy.  Eyes: Anicteric Respiratory:  dullness at both bases with decreased vocal fremitus. No egophony. No adventitial sounds.  Cardiovascular: S1-S2 appreciated Extremities: No extremity tenderness, venous stasis rash on both legs.  Neuro: alert and oriented with no focal deficits.  Psych: Normal mood, normal affect  Ancillary tests personally reviewed  CXR 6/10: pigtail has migrated out of chest. Moderate bilateral effusions but right opacifications appear loculated.   Assessment & Plan:  Principal Problem:   Sepsis ([place]) Active Problems:   Acute respiratory failure with hypoxia (HCC)   CLL (chronic lymphocytic leukemia) (HCC)   Shock (HCC)   Bilateral pleural effusion   Alteration in electrolyte and fluid balance   Chronic diastolic heart failure (HCC)   DNR (do not resuscitate)   Protein-calorie malnutrition, severe   Pressure injury of skin  Plan:  - Remove chest tube as has migrated out of chest.  - CT chest to reassess anatomy of right effusion - may need CT guided drainage.  - Start octreotide to in attempt to slow chyle production as repeated bilateral chest drainage not a tenable long-term solution.  Midodrine may also help . - RD to see, low fat diet would also help reduce chyle flow, but patient is already malnourished. - Ultimately treatment of underlying cause if possible is necessary. May need to start treatment as inpatient.    Best Practice (right click and "Reselect all SmartList Selections" daily)    Diet/type: Regular consistency (see orders) DVT prophylaxis: LMWH GI prophylaxis: N/A Lines: N/A Foley:  N/A Code Status:  DNR Last date of multidisciplinary goals of care discussion [6/3 spoke with daughter at bedside plan for one way extubation. ]  Sherrilyn Rist, MD [place] [facility] Pager: See Shea Evans

## 2022-02-23 DIAGNOSIS — I5032 Chronic diastolic (congestive) heart failure: Secondary | ICD-10-CM | POA: Diagnosis not present

## 2022-02-23 DIAGNOSIS — C9110 Chronic lymphocytic leukemia of B-cell type not having achieved remission: Secondary | ICD-10-CM | POA: Diagnosis not present

## 2022-02-23 DIAGNOSIS — E875 Hyperkalemia: Secondary | ICD-10-CM

## 2022-02-23 DIAGNOSIS — A419 Sepsis, unspecified organism: Secondary | ICD-10-CM | POA: Diagnosis not present

## 2022-02-23 DIAGNOSIS — J90 Pleural effusion, not elsewhere classified: Secondary | ICD-10-CM | POA: Diagnosis not present

## 2022-02-23 LAB — BASIC METABOLIC PANEL
Anion gap: 7 (ref 5–15)
BUN: 19 mg/dL (ref 8–23)
CO2: 28 mmol/L (ref 22–32)
Calcium: 8.1 mg/dL — ABNORMAL LOW (ref 8.9–10.3)
Chloride: 100 mmol/L (ref 98–111)
Creatinine, Ser: 0.5 mg/dL (ref 0.44–1.00)
GFR, Estimated: >60 mL/min (ref >60)
Glucose, Bld: 233 mg/dL — ABNORMAL HIGH (ref 70–99)
Potassium: 5.3 mmol/L — ABNORMAL HIGH (ref 3.5–5.1)
Sodium: 135 mmol/L (ref 135–145)

## 2022-02-23 LAB — CBC WITH DIFFERENTIAL/PLATELET
Abs Immature Granulocytes: 11.5 10*3/uL — ABNORMAL HIGH (ref 0.00–0.07)
Basophils Absolute: 0 10*3/uL (ref 0.0–0.1)
Basophils Relative: 0 %
Eosinophils Absolute: 0 10*3/uL (ref 0.0–0.5)
Eosinophils Relative: 0 %
HCT: 40.4 % (ref 36.0–46.0)
Hemoglobin: 13 g/dL (ref 12.0–15.0)
Lymphocytes Relative: 83 %
Lymphs Abs: 86.9 10*3/uL — ABNORMAL HIGH (ref 0.7–4.0)
MCH: 29.3 pg (ref 26.0–34.0)
MCHC: 32.2 g/dL (ref 30.0–36.0)
MCV: 91 fL (ref 80.0–100.0)
Monocytes Absolute: 1 10*3/uL (ref 0.1–1.0)
Monocytes Relative: 1 %
Myelocytes: 11 %
Neutro Abs: 5.2 10*3/uL (ref 1.7–7.7)
Neutrophils Relative %: 5 %
Platelets: 403 10*3/uL — ABNORMAL HIGH (ref 150–400)
RBC: 4.44 MIL/uL (ref 3.87–5.11)
RDW: 17.5 % — ABNORMAL HIGH (ref 11.5–15.5)
WBC: 104.7 10*3/uL (ref 4.0–10.5)
nRBC: 0 % (ref 0.0–0.2)
nRBC: 0 /100 WBC

## 2022-02-23 LAB — GLUCOSE, CAPILLARY
Glucose-Capillary: 125 mg/dL — ABNORMAL HIGH (ref 70–99)
Glucose-Capillary: 384 mg/dL — ABNORMAL HIGH (ref 70–99)
Glucose-Capillary: 146 mg/dL — ABNORMAL HIGH (ref 70–99)
Glucose-Capillary: 322 mg/dL — ABNORMAL HIGH (ref 70–99)

## 2022-02-23 MED ORDER — ACETAMINOPHEN 325 MG PO TABS
650.0000 mg | ORAL_TABLET | Freq: Once | ORAL | Status: AC
  Administered 2022-02-23: 650 mg via ORAL
  Filled 2022-02-23: qty 2

## 2022-02-23 MED ORDER — SODIUM ZIRCONIUM CYCLOSILICATE 10 G PO PACK
10.0000 g | PACK | Freq: Two times a day (BID) | ORAL | Status: AC
Start: 2022-02-23 — End: 2022-02-23
  Administered 2022-02-23 (×2): 10 g via ORAL
  Filled 2022-02-23 (×2): qty 1

## 2022-02-23 NOTE — Progress Notes (Signed)
PROGRESS NOTE    PAITEN [name]  ZFP:[id number] DOB: February 20, 1961 DOA: 02/25/2022 PCP: Noreene Larsson, NP    Chief Complaint  Patient presents with   unresponsive    Brief Narrative:  Patient is a 61 year old female with pertinent PMH of CLL, depression, anxiety, HLD, HTN presents to [facility] ED on 6/2 unresponsive.  On 6/2 patient called EMS for abdominal pain.  Patient initially was alert and talking but became unresponsive with AMS.  Patient was hypoxic with agonal breathing and was placed on NRB.  Patient was noted to be hypotensive.  Patient transported to [facility] ED.  Upon arrival to [facility] ED patient remained unresponsive with agonal respirations.  Patient intubated for airway protection and hypoxia.  BP initially 81/65.  Given IV fluids.  Started on levo.  Fever 100.9 F and WBC 104.4.  Started on cefepime, Flagyl, Vanco.  Cultures pending.  Glucose 198, BNP 534, troponin 109 then 112, LA 4.4 then 1.8, UA unremarkable, UDS negative.  CXR with large left pleural effusion and small right pleural effusion.  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  CT abdomen with ascending colitis constipation, small intestine wall thickening possibly related to gastroenteritis; small volume ascites; extensive abdominopelvic and inguinal adenopathy.  PCCM consulted for ICU admission and transfer to [facility].   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 -Patient noted to have bulky adenopathy in the mediastinum status post axillary lymph node biopsy. -Patient started on pulse dose steroids on 1 mg/kg-50 mg of prednisone started. -Status post tube placement on the right being managed per PCCM. -Chest tube noted to have migrated out of the chest and as such removed, -Per PCCM patient may need CT-guided drainage. -Patient started on octreotide per PCCM and attempt to slow chyle production as repeated bilateral trace drainage not obtainable long-term solution. -PCCM having dietitian to see how low-fat diet will also help reduce chyle flow. -Chest x-ray with worsening left-sided pleural effusion and patient underwent ultrasound-guided thoracentesis by PCCM on 03/05/2022 with 1400 cc of fluid removed with studies pending.  -Per PCCM patient meeting need to start treatment for CLL inpatient. -Per PCCM.  2.  CLL/SLL -Status post axillary lymph node biopsy with biopsy results consistent with CLL/SLL.. -Patient started on pulse dose steroids. -Per oncology.  3.  Chronic diastolic CHF grade 2/hypertension/hyperlipidemia -ACE inhibitor and diuretic currently on hold. -BP stable. -No signs of overt volume overload on examination. -Follow.  4.  Leukocytosis -Likely multifactorial secondary to CLL, pulse dose steroids. -Oncology following and arranging outpatient follow-up and further work-up in the outpatient setting. -Follow.  5.  Shock/hypotension/?Sepsis -Resolved.   -Questionable etiology patient treated as a sepsis with no obvious source.   -Status post 5 days antibiotics. -No further antibiotics needed  6.  Hyperglycemia -Likely secondary to steroids. -Hemoglobin A1c 6.5. -CBG 125 this morning. -Continue Semglee to 8 units daily.   -SSI.  -Diabetes coordinator following.  7.  Hyperkalemia -Lokelma 10 mg twice daily x1 day. -Repeat labs in the AM.  8.  Pressure injury, POA Pressure Injury 02/17/22 Sacrum Medial  Stage 1 -  Intact skin with non-blanchable redness of a localized area usually over a bony prominence. (Active)  02/17/22 0745  Location: Sacrum  Location Orientation: Medial  Staging: Stage 1 -  Intact skin with non-blanchable redness of a localized area usually over a bony prominence.  Wound Description (Comments):   Present on Admission: No       DVT prophylaxis: Lovenox Code Status: DNR Family Communication: Updated patient.  No family at bedside. Disposition: TBD  Status is: Inpatient Remains inpatient appropriate because: Severity of illness.   Consultants:  Interventional radiology: Dr. Vernard Gambles 02/17/2022 General surgery: Dr. Dema Severin 02/17/2022 Oncology: Dr. Lorenso Courier 02/16/2022    [facility] Events: Including procedures, antibiotic start and stop dates in addition to other pertinent events   6/2: Patient admitted to [facility] ED unresponsive; intubated; shock started on levo; transferred to [facility]. CT chest and abd: 1. Extensive abdominopelvic and inguinal adenopathy, most likely related to lymphoma/leukemia. Numerous small hypodense splenic lesions versus bolus phase phenomenon. Intrahepatic periportal edema which is probably either due to fluid overload or hepatic dysfunction. The portal vein is patent within normal caliber limits. Chronic calcific pancreatitis but no findings of acute pancreatitis or ductal dilatation.  Ascending colitis versus nondistention, remainder of the colon with constipation. Diffuse small intestinal wall thickening with gastric fold thickening, could relate to gastroenteritis or hepatic dysfunction versus reactive from ascites versus congestive or infiltrating disease. 6/3 right chest tube placed. Left thora 2000 ml removed.  6/4 extubated. ollowing commands. Lots of milky pleural fluid removed yesterday - exudative, cx negative, lymphocytic. 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 Off pressors 6/6 Transferred to [facility]        Procedures:  CT head 02/23/2022 CT angiogram chest 02/17/2022 CT abdomen and pelvis 02/22/2022 Chest x-ray 02/24/2022, 02/15/2022, 03/03/2022, 02/17/2022, 02/19/2022 2D echo 02/15/2022 Right axillary core lymph node biopsy 02/18/2022 Ultrasound-guided thoracentesis 02/18/2022  Antimicrobials:  IV cefepime 03/08/2022>>>> 02/18/2022 IV Flagyl 02/28/2022>>>> 02/18/2022 IV vancomycin 02/22/2022>>> 02/15/2022   Subjective: Sitting up in bed.  No chest pain.  No shortness of breath.  States had some abdominal pain.  Chest tube removed yesterday by PCCM.  Tolerating current diet.   Objective: Vitals:   02/22/22 2058 02/23/22 0500 02/23/22 0516 02/23/22 0736  BP: 107/66  132/76 124/90  Pulse: 96  95 95  Resp: $Remo'18  16 18  'Attwf$ Temp: 98.9 F (37.2 C)  98.4 F (36.9 C) 98.4 F (36.9 C)  TempSrc: Oral  Oral Oral  SpO2: 97%  98% 97%  Weight:  48.1 kg    Height:        Intake/Output Summary (Last 24 hours) at 02/23/2022 1201 Last data filed at 02/22/2022 2130 Gross per 24 hour  Intake 240 ml  Output --  Net 240 ml    Filed Weights   02/21/2022 0616 02/20/22 0803 02/23/22 0500  Weight: 49.5 kg 47.5 kg 48.1 kg    Examination:  General exam: NAD. Respiratory system: Improved basilar breath sounds on the left.  Fair air movement.  Speaking in full sentences. Cardiovascular system: Regular rate rhythm no murmurs rubs or gallops.  No JVD.  No lower extremity edema.   Gastrointestinal system: Abdomen is soft, nontender, positive bowel sounds, some tenderness to palpation in the mid to lower abdominal region.  Positive bowel sounds. Central nervous system: Alert and oriented. No  focal neurological deficits. Extremities: Symmetric 5 x 5 power. Skin: No rashes, lesions or ulcers Psychiatry: Judgement and insight appear normal. Mood & affect appropriate.     Data Reviewed: I have personally reviewed following labs and imaging studies  CBC: Recent Labs  Lab 02/19/22 0944 02/17/2022 0053 02/21/22 0710  02/22/22 0135 02/23/22 0031  WBC 82.6* 85.7* 89.8* 96.8* 104.7*  NEUTROABS 5.8 6.7 5.5 13.6* 5.2  HGB 11.5* 11.1* 11.8* 12.0 13.0  HCT 36.3 35.9* 37.3 38.7 40.4  MCV 91.4 92.1 91.2 91.5 91.0  PLT 181 211 269 339 403*     Basic Metabolic Panel: Recent Labs  Lab 02/19/22 0944 02/24/2022 0053 02/21/22 0710 02/22/22 0135 02/23/22 0031  NA 135 137 138 136 135  K 4.9 4.8 5.2* 4.7 5.3*  CL 106 103 103 102 100  CO2 $Re'25 29 28 31 28  'ddw$ GLUCOSE 367* 239* 93 128* 233*  BUN 21 24* 22 25* 19  CREATININE 0.71 0.69 0.55 0.59 0.50  CALCIUM 7.7* 7.8* 8.0* 7.8* 8.1*  MG 2.1  --  1.9  --   --      GFR: Estimated Creatinine Clearance: 56.1 mL/min (by C-G formula based on SCr of 0.5 mg/dL).  Liver Function Tests: Recent Labs  Lab 02/17/22 0427 02/18/22 0056 02/19/22 0944 02/21/22 0710  AST $Re'23 21 24 27  'nHt$ ALT $R'20 16 18 20  'MU$ ALKPHOS 69 79 77 77  BILITOT 1.4* 0.5 0.3 0.1*  PROT 4.3* 4.7* 4.8* 4.5*  ALBUMIN 1.9* 2.1* 2.0* 1.8*     CBG: Recent Labs  Lab 02/22/22 0738 02/22/22 1202 02/22/22 1546 02/22/22 2055 02/23/22 0734  GLUCAP 142* 166* 195* 253* 125*      Recent Results (from the past 240 hour(s))  Culture, blood (routine x 2)     Status: None   Collection Time: 03/09/2022  7:10 PM   Specimen: BLOOD RIGHT FOREARM  Result Value Ref Range Status   Specimen Description BLOOD RIGHT FOREARM BOTTLES DRAWN AEROBIC ONLY  Final   Special Requests   Final    Blood Culture results may not be optimal due to an inadequate volume of blood received in culture bottles   Culture   Final    NO GROWTH 5 DAYS Performed at [facility], [address], [place], [place] [address]    Report Status 02/19/2022 FINAL  Final  Culture, blood (routine x 2)     Status: None   Collection Time: 02/18/2022  7:42 PM   Specimen: BLOOD RIGHT ARM  Result Value Ref Range Status   Specimen Description BLOOD RIGHT ARM BOTTLES DRAWN AEROBIC ONLY  Final   Special Requests Blood Culture adequate volume  Final    Culture   Final    NO GROWTH 5 DAYS Performed at [facility], [address], [place], [place] [address]    Report Status 02/19/2022 FINAL  Final  MRSA Next Gen by PCR, Nasal     Status: None   Collection Time: 02/15/22  1:20 AM   Specimen: Nasal Mucosa; Nasal Swab  Result Value Ref Range Status   MRSA by PCR Next Gen NOT DETECTED NOT DETECTED Final    Comment: (NOTE) The GeneXpert MRSA Assay (FDA approved for NASAL specimens only), is one component of a comprehensive MRSA colonization surveillance program. It is not intended to diagnose MRSA infection nor to guide or monitor treatment for MRSA infections. Test performance is not FDA approved in patients less than 57 years old. Performed at [facility],  [address] [place], [place] [address]   Urine Culture     Status: None   Collection Time: 02/15/22  1:53 AM   Specimen: Urine, Clean Catch  Result Value Ref Range Status   Specimen Description URINE, CLEAN CATCH  Final   Special Requests NONE  Final   Culture   Final    NO GROWTH Performed at [facility], [place] [address], [place], [place] [address]    Report Status 02/16/2022 FINAL  Final  Body fluid culture w Gram Stain     Status: None   Collection Time: 02/15/22  1:45 PM   Specimen: Pleura; Body Fluid  Result Value Ref Range Status   Specimen Description PLEURAL  Final   Special Requests NONE  Final   Gram Stain   Final    FEW WBC PRESENT, PREDOMINANTLY MONONUCLEAR NO ORGANISMS SEEN    Culture   Final    NO GROWTH 3 DAYS Performed at [facility], [address] [place], [place] [address]    Report Status 02/18/2022 FINAL  Final  Culture, body fluid w Gram Stain-bottle     Status: None   Collection Time: 02/15/22  4:30 PM   Specimen: Pleura  Result Value Ref Range Status   Specimen Description PLEURAL FLUID RIGHT LUNG  Final   Special Requests NONE  Final   Culture   Final    NO GROWTH 5 DAYS Performed at [facility]  [address], [place], [place] [address]    Report Status 03/12/2022 FINAL  Final  Gram stain     Status: None   Collection Time: 02/15/22  4:30 PM   Specimen: Pleura  Result Value Ref Range Status   Specimen Description PLEURAL FLUID RIGHT LUNG  Final   Special Requests NONE  Final   Gram Stain   Final    NO SQUAMOUS EPITHELIAL CELLS SEEN FEW WBC SEEN NO ORGANISMS SEEN Performed at [facility], [place] [address], [place], [place] [address]    Report Status 02/15/2022 FINAL  Final  Body fluid culture w Gram Stain     Status: None (Preliminary result)   Collection Time: 03/05/2022  2:35 PM   Specimen: Pleural Fluid  Result Value Ref Range Status   Specimen Description PLEURAL  Final   Special Requests NONE  Final   Gram Stain   Final    FEW WBC PRESENT, PREDOMINANTLY PMN NO ORGANISMS SEEN    Culture   Final    NO GROWTH 3 DAYS Performed at [facility], [place] [address], [place], [place] [address]    Report Status PENDING  Incomplete  Acid Fast Smear (AFB)     Status: None   Collection Time: 02/19/2022  2:35 PM   Specimen: Pleural, Left  Result Value Ref Range Status   AFB Specimen Processing Concentration  Final   Acid Fast Smear Negative  Final    Comment: (NOTE) Performed At: [facility] [place], [place] [id number] Rush Farmer MD PY:[id number]    Source (AFB) PLEURAL  Final    Comment: Performed at [facility], [place] [address], [place], [place] [address]         Radiology Studies: CT CHEST WO CONTRAST  Result Date: 02/22/2022 CLINICAL DATA:  Pleural effusion. Malignancy suspected. * Tracking Code: BO * EXAM: CT CHEST WITHOUT CONTRAST TECHNIQUE: Multidetector CT imaging of the chest was performed following the standard protocol without IV  contrast. RADIATION DOSE REDUCTION: This exam was performed according to the departmental dose-optimization program which includes automated exposure control, adjustment of the mA and/or kV according to  patient size and/or use of iterative reconstruction technique. COMPARISON:  Chest CTA 02/26/2022 FINDINGS: Cardiovascular: The heart size is normal. No substantial pericardial effusion. Coronary artery calcification is evident. Mild atherosclerotic calcification is noted in the wall of the thoracic aorta. Mediastinum/Nodes: Assessment in mediastinum limited by lack of intravenous contrast extensive pleural fluid. Possible 15 mm subcarinal short axis lymph node on 81/3. Imaging appearance suspicious for right hilar lymphadenopathy although again this is not well assessed given lack of intravenous contrast. Bulky axillary lymphadenopathy seen bilaterally. As before, there appears to be bulky adenopathy in the lower neck, poorly visualized given lack of intravenous contrast. Lungs/Pleura: Bilateral large pleural effusions noted with prominent sub pulmonic components. There is dependent collapse/consolidation in the lower lungs. Centrilobular emphsyema noted. Tiny right apical pneumothorax evident. Upper Abdomen: Not well seen. Musculoskeletal: No worrisome lytic or sclerotic osseous abnormality. IMPRESSION: 1. Tiny right apical pneumothorax. 2. Bilateral large pleural effusions with dependent collapse/consolidation in the lower lungs. 3. Bulky axillary and lower cervical lymphadenopathy, poorly visualized given lack of intravenous contrast. Probable mediastinal lymphadenopathy. 4. Aortic Atherosclerosis (ICD10-I70.0). These results will be called to the ordering clinician or representative by the Radiologist Assistant, and communication documented in the PACS or [employer]. Electronically Signed   By: Misty Stanley M.D.   On: 02/22/2022 16:34   DG CHEST PORT 1 VIEW  Result Date: 02/22/2022 CLINICAL DATA:  Pleural effusion. EXAM: PORTABLE CHEST 1 VIEW COMPARISON:  Chest radiograph 02/21/2022 FINDINGS: The cardiomediastinal silhouette is grossly unchanged, with the heart remaining partially obscured. Aortic  atherosclerosis is noted. A small right apical pneumothorax is unchanged. A pleural catheter projects near the inferior aspect of the right lateral costophrenic sulcus, unchanged. Small right and moderate left pleural effusions appear mildly increased, although this may be partly due to differences in patient positioning. Mild diffuse interstitial opacities and bibasilar airspace opacities have not significantly changed. IMPRESSION: 1. Unchanged small right apical pneumothorax. 2. Mildly increased size of left larger than right pleural effusions. 3. Unchanged interstitial and airspace opacities which may reflect mild edema and atelectasis. Electronically Signed   By: Logan Bores M.D.   On: 02/22/2022 10:21        Scheduled Meds:  chlorhexidine gluconate (MEDLINE KIT)  15 mL Mouth Rinse BID   docusate sodium  100 mg Oral BID   enoxaparin (LOVENOX) injection  40 mg Subcutaneous Q24H   feeding supplement  1 Container Oral TID BM   insulin aspart  0-9 Units Subcutaneous TID WC   insulin glargine-yfgn  8 Units Subcutaneous Daily   multivitamin with minerals  1 tablet Oral Daily   octreotide  50 mcg Subcutaneous Daily   polyethylene glycol  17 g Oral Daily   predniSONE  50 mg Oral Q breakfast   sodium chloride flush  10-40 mL Intracatheter Q12H   sodium zirconium cyclosilicate  10 g Oral BID   Continuous Infusions:  sodium chloride 10 mL/hr at 02/17/22 0800     LOS: 9 days    Time spent: 35 minutes    Irine Seal, MD [facility]   To contact the attending provider between 7A-7P or the covering provider during after hours 7P-7A, please log into the web site [email or web address] and access using universal [place] password for that web site. If you do not have the password, please call the hospital  operator.  02/23/2022, 12:01 PM

## 2022-02-24 ENCOUNTER — Encounter (HOSPITAL_COMMUNITY): Payer: Self-pay | Admitting: Pulmonary Disease

## 2022-02-24 ENCOUNTER — Encounter (HOSPITAL_COMMUNITY): Admission: EM | Disposition: E | Payer: Self-pay | Source: Home / Self Care | Attending: Internal Medicine

## 2022-02-24 ENCOUNTER — Inpatient Hospital Stay (HOSPITAL_COMMUNITY): Payer: Medicaid Other

## 2022-02-24 DIAGNOSIS — C9110 Chronic lymphocytic leukemia of B-cell type not having achieved remission: Secondary | ICD-10-CM | POA: Diagnosis not present

## 2022-02-24 DIAGNOSIS — A419 Sepsis, unspecified organism: Secondary | ICD-10-CM | POA: Diagnosis not present

## 2022-02-24 DIAGNOSIS — I5032 Chronic diastolic (congestive) heart failure: Secondary | ICD-10-CM | POA: Diagnosis not present

## 2022-02-24 DIAGNOSIS — J9601 Acute respiratory failure with hypoxia: Secondary | ICD-10-CM | POA: Diagnosis not present

## 2022-02-24 DIAGNOSIS — J90 Pleural effusion, not elsewhere classified: Secondary | ICD-10-CM | POA: Diagnosis not present

## 2022-02-24 HISTORY — PX: CHEST TUBE INSERTION: SHX231

## 2022-02-24 LAB — BODY FLUID CULTURE W GRAM STAIN: Culture: NO GROWTH

## 2022-02-24 LAB — BASIC METABOLIC PANEL
Anion gap: 7 (ref 5–15)
BUN: 15 mg/dL (ref 8–23)
CO2: 30 mmol/L (ref 22–32)
Calcium: 8 mg/dL — ABNORMAL LOW (ref 8.9–10.3)
Chloride: 101 mmol/L (ref 98–111)
Creatinine, Ser: 0.41 mg/dL — ABNORMAL LOW (ref 0.44–1.00)
GFR, Estimated: >60 mL/min (ref >60)
Glucose, Bld: 144 mg/dL — ABNORMAL HIGH (ref 70–99)
Potassium: 4.8 mmol/L (ref 3.5–5.1)
Sodium: 138 mmol/L (ref 135–145)

## 2022-02-24 LAB — CBC WITH DIFFERENTIAL/PLATELET
Abs Immature Granulocytes: 0.2 10*3/uL — ABNORMAL HIGH (ref 0.00–0.07)
Basophils Absolute: 0.1 10*3/uL (ref 0.0–0.1)
Basophils Relative: 0 %
Eosinophils Absolute: 0 10*3/uL (ref 0.0–0.5)
Eosinophils Relative: 0 %
HCT: 39.5 % (ref 36.0–46.0)
Hemoglobin: 12.3 g/dL (ref 12.0–15.0)
Immature Granulocytes: 0 %
Lymphocytes Relative: 94 %
Lymphs Abs: 100 10*3/uL — ABNORMAL HIGH (ref 0.7–4.0)
MCH: 28.5 pg (ref 26.0–34.0)
MCHC: 31.1 g/dL (ref 30.0–36.0)
MCV: 91.6 fL (ref 80.0–100.0)
Monocytes Absolute: 0.5 10*3/uL (ref 0.1–1.0)
Monocytes Relative: 1 %
Neutro Abs: 5.4 10*3/uL (ref 1.7–7.7)
Neutrophils Relative %: 5 %
Platelets: 498 10*3/uL — ABNORMAL HIGH (ref 150–400)
RBC: 4.31 MIL/uL (ref 3.87–5.11)
RDW: 17.6 % — ABNORMAL HIGH (ref 11.5–15.5)
WBC: 106.3 10*3/uL (ref 4.0–10.5)
nRBC: 0 % (ref 0.0–0.2)

## 2022-02-24 LAB — GLUCOSE, CAPILLARY
Glucose-Capillary: 148 mg/dL — ABNORMAL HIGH (ref 70–99)
Glucose-Capillary: 183 mg/dL — ABNORMAL HIGH (ref 70–99)
Glucose-Capillary: 176 mg/dL — ABNORMAL HIGH (ref 70–99)
Glucose-Capillary: 229 mg/dL — ABNORMAL HIGH (ref 70–99)

## 2022-02-24 LAB — TRIGLYCERIDES, BODY FLUIDS: Triglycerides, Fluid: 365 mg/dL

## 2022-02-24 SURGERY — CHEST TUBE INSERTION
Anesthesia: LOCAL

## 2022-02-24 MED ORDER — BOOST / RESOURCE BREEZE PO LIQD CUSTOM
1.0000 | Freq: Three times a day (TID) | ORAL | Status: DC
  Administered 2022-02-24 – 2022-02-25 (×3): 1 via ORAL

## 2022-02-24 NOTE — Progress Notes (Signed)
HEMATOLOGY-ONCOLOGY PROGRESS NOTE  ASSESSMENT AND PLAN: [name] is a 61 year old female with medical history significant for CLL previously followed by Dr. 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  Pleural fluid showed atypical lymphocytes suspicious for lymphoproliferative process.  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  She underwent a lymph node biopsy which showed monoclonal lymphoid population of CD20/CD19 positive B cells coexpressing CD5 and CD200 consistent with her known history of SLL/CLL.  Following her lymph node biopsy, she was started on prednisone 1 mg/kg which she remains on. 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  She had reinsertion of a right chest tube performed earlier today.  Her CBC from today has been reviewed and white blood cell count remains significantly elevated due to her underlying CLL as well as steroid use.  She has no anemia and has mild thrombocytosis, likely due to steroids.   # Diffuse Lymphomadenopathy/Prior Diagnosis of CLL -- There is been marked enlargement of the lymph nodes in the interim since the patient's last visit in 2021 which was concerning for transformation to more aggressive disease process. --Cytology from pleural effusion as well as lymph node biopsy consistent with her previously reported history of CLL/SLL. --Remarkably patient does not appear to have splenomegaly or any cytopenias -- She was started on prednisone 1 mg/kg daily after her lymph node biopsy was completed. --FISH HES Leukemia showed 52% OF NUCLEI POSITIVE FOR AN ATM DELETION; 42% OF NUCLEI POSITIVE FOR 13Q DELETION  --IgVH pending. ZAP-70 borderline, CD38 positive, CD49d negative. -- She has not had significant improvement in her CLL/SLL and chylothorax with prednisone.  We can consider her for rituximab therapy, however, this will not work  quickly but steroids tend to work the quickest for CLL. --At the time of discharge, she will follow-up with Dr. Deland Pretty [place].  I placed this referral on 02/19/2022 but no appointment has been scheduled yet.  # Leukocytosis/lymphocytosis -- White blood cell count and lymphocytes is slowly increasing during this hospital admission. --Rise in white blood cell count and lymphocyte count due to underlying CLL as well as due to steroids. --Monitor   # Chylothorax  # Respiratory Failure --Management per pulmonary and primary team.  Mikey Bussing  SUBJECTIVE: [name] had a CT of the chest performed over the weekend which showed bilateral large pleural effusions with dependent collapse/consolidation in the lower lungs.  She was also again noted to have bulky axillary and lower cervical lymphadenopathy which was not well visualized as well as probable mediastinal adenopathy.  Her prior chest tube had migrated out.  Today she had increased tachypnea and overall worsening of her shortness of breath.  A right chest tube was reinserted earlier today.  She had just returned from this procedure at the time my visit.  Sleeping quietly, difficult to awaken.  REVIEW OF SYSTEMS:    Chart reviewed and she is not having any fevers.  Unable to obtain a comprehensive review of systems.  PHYSICAL EXAMINATION:  Vitals:   03/12/2022 1320 03/10/2022 1330  BP: (!) 141/81 113/78  Pulse: 96 96  Resp: (!) 31 (!) 31  Temp:    SpO2: 98% 99%   Filed Weights   02/23/22 0500 02/17/2022 0500 02/23/2022 0744  Weight: 48.1 kg 53.1 kg 51.9 kg    Intake/Output from previous day: 06/11 0701 - 06/12 0700 In: 240 [P.O.:240] Out: 400 [Urine:400]  Physical Exam Vitals reviewed.  Constitutional:  Comments: Resting quietly, no distress.   HENT:     Head: Normocephalic.  Eyes:     General: No scleral icterus.    Conjunctiva/sclera: Conjunctivae normal.  Pulmonary:     Effort: Pulmonary effort is normal. No  respiratory distress.     Comments: Right chest to to suction Skin:    General: Skin is warm and dry.    LABORATORY DATA:  I have reviewed the data as listed    Latest Ref Rng & Units 03/14/2022    4:13 AM 02/23/2022   12:31 AM 02/22/2022    1:35 AM  CMP  Glucose 70 - 99 mg/dL 144  233  128   BUN 8 - 23 mg/dL $Remove'15  19  25   '[id number]$ Creatinine 0.44 - 1.00 mg/dL 0.41  0.50  0.59   Sodium 135 - 145 mmol/L 138  135  136   Potassium 3.5 - 5.1 mmol/L 4.8  5.3  4.7   Chloride 98 - 111 mmol/L 101  100  102   CO2 22 - 32 mmol/L $RemoveB'30  28  31   '[id number]$ Calcium 8.9 - 10.3 mg/dL 8.0  8.1  7.8     Lab Results  Component Value Date   WBC 106.3 (HH) 02/19/2022   HGB 12.3 03/10/2022   HCT 39.5 03/07/2022   MCV 91.6 03/14/2022   PLT 498 (H) 02/23/2022   NEUTROABS 5.4 02/17/2022    No results found for: "CEA1", "CEA", "CAN199", "CA125", "PSA1"  DG Chest Port 1 View  Result Date: 02/23/2022 CLINICAL DATA:  Chest tube placement EXAM: PORTABLE CHEST 1 VIEW COMPARISON:  02/22/2022 FINDINGS: A right pigtail pleural drainage catheter is in place. The pneumothorax component of the right-sided hydropneumothorax is no longer appreciated. Reduced right pleural fluid. There is continued blunting of the right lateral costophrenic angle likely reflecting some residual pleural effusion. There is also a large left pleural effusion encompassing well over half of the left hemithorax. Atherosclerotic calcification of the aortic arch. IMPRESSION: 1. Substantially reduced right pleural fluid, status post pleural pigtail drainage catheter placement. The pneumothorax component of the right hydropneumothorax is no longer seen. 2. Large left pleural effusion. 3.  Aortic Atherosclerosis (ICD10-I70.0). Electronically Signed   By: Van Clines M.D.   On: 03/03/2022 13:48   CT CHEST WO CONTRAST  Result Date: 02/22/2022 CLINICAL DATA:  Pleural effusion. Malignancy suspected. * Tracking Code: BO * EXAM: CT CHEST WITHOUT CONTRAST TECHNIQUE:  Multidetector CT imaging of the chest was performed following the standard protocol without IV contrast. RADIATION DOSE REDUCTION: This exam was performed according to the departmental dose-optimization program which includes automated exposure control, adjustment of the mA and/or kV according to patient size and/or use of iterative reconstruction technique. COMPARISON:  Chest CTA 02/28/2022 FINDINGS: Cardiovascular: The heart size is normal. No substantial pericardial effusion. Coronary artery calcification is evident. Mild atherosclerotic calcification is noted in the wall of the thoracic aorta. Mediastinum/Nodes: Assessment in mediastinum limited by lack of intravenous contrast extensive pleural fluid. Possible 15 mm subcarinal short axis lymph node on 81/3. Imaging appearance suspicious for right hilar lymphadenopathy although again this is not well assessed given lack of intravenous contrast. Bulky axillary lymphadenopathy seen bilaterally. As before, there appears to be bulky adenopathy in the lower neck, poorly visualized given lack of intravenous contrast. Lungs/Pleura: Bilateral large pleural effusions noted with prominent sub pulmonic components. There is dependent collapse/consolidation in the lower lungs. Centrilobular emphsyema noted. Tiny right apical pneumothorax evident. Upper Abdomen: Not well seen. Musculoskeletal:  No worrisome lytic or sclerotic osseous abnormality. IMPRESSION: 1. Tiny right apical pneumothorax. 2. Bilateral large pleural effusions with dependent collapse/consolidation in the lower lungs. 3. Bulky axillary and lower cervical lymphadenopathy, poorly visualized given lack of intravenous contrast. Probable mediastinal lymphadenopathy. 4. Aortic Atherosclerosis (ICD10-I70.0). These results will be called to the ordering clinician or representative by the Radiologist Assistant, and communication documented in the PACS or [employer]. Electronically Signed   By: Misty Stanley M.D.    On: 02/22/2022 16:34   DG CHEST PORT 1 VIEW  Result Date: 02/22/2022 CLINICAL DATA:  Pleural effusion. EXAM: PORTABLE CHEST 1 VIEW COMPARISON:  Chest radiograph 02/21/2022 FINDINGS: The cardiomediastinal silhouette is grossly unchanged, with the heart remaining partially obscured. Aortic atherosclerosis is noted. A small right apical pneumothorax is unchanged. A pleural catheter projects near the inferior aspect of the right lateral costophrenic sulcus, unchanged. Small right and moderate left pleural effusions appear mildly increased, although this may be partly due to differences in patient positioning. Mild diffuse interstitial opacities and bibasilar airspace opacities have not significantly changed. IMPRESSION: 1. Unchanged small right apical pneumothorax. 2. Mildly increased size of left larger than right pleural effusions. 3. Unchanged interstitial and airspace opacities which may reflect mild edema and atelectasis. Electronically Signed   By: Logan Bores M.D.   On: 02/22/2022 10:21   DG Chest Port 1 View  Result Date: 02/21/2022 CLINICAL DATA:  Pneumothorax EXAM: PORTABLE CHEST 1 VIEW COMPARISON:  03/10/2022 FINDINGS: Unchanged AP portable chest radiograph with a small, less than 10% right apical pneumothorax. Unchanged left greater than right layering bilateral pleural effusions and diffuse bilateral interstitial opacity. Heart and mediastinum are unremarkable. IMPRESSION: 1. Unchanged AP portable chest radiograph with a small, less than 10% right apical pneumothorax. 2. Unchanged left greater than right layering bilateral pleural effusions and diffuse bilateral interstitial opacity, likely edema. Electronically Signed   By: Delanna Ahmadi M.D.   On: 02/21/2022 09:17   DG Chest Port 1 View  Result Date: 02/15/2022 CLINICAL DATA:  Postprocedure. EXAM: PORTABLE CHEST 1 VIEW COMPARISON:  Chest x-ray 02/19/2022.  Chest CT 02/15/2022. FINDINGS: Right pleural drainage catheter is unchanged. There is a  small left pleural effusion which has significantly decreased from prior. There is a small right apical pneumothorax measuring 1 cm from the lung apex. No left-sided pneumothorax visualized. No mediastinal shift. The cardiomediastinal silhouette is within normal limits. The osseous structures are stable. IMPRESSION: 1. Small right apical pneumothorax. Right pleural drainage catheter in place. 2. Small left pleural effusion has decreased from prior. Electronically Signed   By: Ronney Asters M.D.   On: 02/25/2022 16:08   DG CHEST PORT 1 VIEW  Result Date: 02/19/2022 CLINICAL DATA:  Colles thorax.  Chest tube follow-up. EXAM: PORTABLE CHEST 1 VIEW COMPARISON:  02/17/2022 FINDINGS: Thoracostomy tube in the lower lateral right chest remains in place. No pleural air. No visible pleural fluid on the right. Increasing amount of pleural fluid on the left, with complete collapse of the left lower lobe and subtotal collapse of the left upper lobe. Central line has been removed. IMPRESSION: Persistent right pleural catheter. No right pleural air or visible pleural fluid. Enlarging volume of pleural fluid on the left, now with complete collapse of the left lower lobe and subtotal collapse of the left upper lobe. Electronically Signed   By: Nelson Chimes M.D.   On: 02/19/2022 07:25   [place] CORE BIOPSY (LYMPH NODES)  Result Date: 02/18/2022 INDICATION: 61 year old female with history CLL presenting multifocal  bilateral axillary masses. EXAM: Ultrasound-guided axillary lymph node biopsy MEDICATIONS: None. ANESTHESIA/SEDATION: None. FLUOROSCOPY TIME:  None. COMPLICATIONS: None immediate. PROCEDURE: Informed written consent was obtained from the patient after a thorough discussion of the procedural risks, benefits and alternatives. All questions were addressed. Maximal Sterile Barrier Technique was utilized including caps, mask, sterile gowns, sterile gloves, sterile drape, hand hygiene and skin antiseptic. A timeout was performed  prior to the initiation of the procedure. Preprocedure ultrasound evaluation demonstrated a prominent, subcutaneous heterogeneously hypoechoic enlarged lymph node measuring approximately 3.3 x 1.9 cm in maximum longitudinal dimensions. The procedure was planned. The right axilla was prepped and draped in standard fashion. Subdermal Local anesthesia was administered with 1% lidocaine at the planned needle entry site. A small skin nick was made. Under direct ultrasound visualization, a 17 gauge introducer needle was directed to the periphery of the lymph node. Next, a total of 4, 18 gauge core biopsies were obtained. Two samples were placed in saline and 2 samples were placed in formalin. The needle was removed. Postprocedure ultrasound demonstrated no evidence of surrounding hematoma or other complicating features. Hemostasis was achieved with brief manual compression. A sterile bandage was applied. The patient tolerated the procedure well without complication. The patient was transferred back to the floor in stable condition. IMPRESSION: Technically successful ultrasound-guided right axillary lymph node biopsy. Ruthann Cancer, MD [facility] Electronically Signed   By: Ruthann Cancer M.D.   On: 02/18/2022 17:53   DG CHEST PORT 1 VIEW  Result Date: 02/17/2022 CLINICAL DATA:  Pleural effusion EXAM: PORTABLE CHEST 1 VIEW COMPARISON:  February 16, 2022 FINDINGS: The heart size and mediastinal contours are stable. Right central venous line is unchanged. Previously noted endotracheal tube and nasogastric tube have been removed. Patchy consolidation of the left mid and lung base with a left pleural effusion are noted. Stable patchy consolidation of right lung base is noted. The visualized skeletal structures are stable. IMPRESSION: New patchy consolidation of the left mid and lung base with a left pleural effusion, suspicious for pneumonia. Stable patchy consolidation  of right lung base. Electronically Signed   By: Abelardo Diesel M.D.   On: 02/17/2022 06:28   DG CHEST PORT 1 VIEW  Result Date: 02/16/2022 CLINICAL DATA:  Follow-up chest tube pleural effusion. EXAM: PORTABLE CHEST 1 VIEW COMPARISON:  Portable chest yesterday at 5:03 p.m. FINDINGS: 5:06 a.m. 02/16/2022. ETT tip 4.0 cm from the carina. Right IJ line tip again in the distal SVC. NGT is well inside the stomach but neither the side-hole or tip are filmed. Basolateral right chest pigtail tube thoracostomy is again noted. There was a minimally small adjacent basolateral pneumothorax on yesterday's film which is not seen today. There is increased hazy consolidation in the right base which could be pneumonia or re-expansion edema. Faint patchy hazy opacities in the left lower lung field are less dense than yesterday with minimal left pleural fluid remaining. No significant right pleural fluid is seen. The mid and upper lungs mildly emphysematous and clear. The mediastinum is stable with aortic tortuosity and atherosclerosis. Osteopenia. IMPRESSION: 1. Chest tube in place with no visible pneumothorax. 2. Other support devices are unaltered. 3. Increased opacity in the right base which could be pneumonitis or re-expansion edema. 4. Improving opacities in the left lower lung field, minimal left pleural effusion. Electronically Signed   By: Telford Nab M.D.   On: 02/16/2022 07:13   DG CHEST PORT 1 VIEW  Result Date: 02/15/2022 CLINICAL  DATA:  Chest tube placement. EXAM: PORTABLE CHEST 1 VIEW COMPARISON:  02/15/2021 and prior radiographs FINDINGS: A RIGHT thoracostomy tube is now noted with pigtail tip overlying the LOWER LATERAL RIGHT hemithorax. Near complete resolution of RIGHT pleural effusion noted with tiny RIGHT basilar pneumothorax. New airspace opacities versus atelectasis in the mid and LOWER LEFT lung noted. A trace LEFT pleural effusion is present. RIGHT IJ central venous catheter with tip overlying the mid  SVC, endotracheal tube with tip 3.5 cm above the carina and NG tube entering the stomach again noted. IMPRESSION: 1. RIGHT thoracostomy tube placement with near complete resolution of RIGHT pleural effusion. Tiny RIGHT basilar pneumothorax. 2. New airspace opacities/atelectasis in the mid and LOWER LEFT lung with trace LEFT pleural effusion. Electronically Signed   By: Harmon Pier M.D.   On: 02/15/2022 17:21   DG CHEST PORT 1 VIEW  Result Date: 02/15/2022 CLINICAL DATA:  Acute respiratory failure. Endotracheally intubated. Status post left thoracentesis. EXAM: PORTABLE CHEST 1 VIEW COMPARISON:  Prior today FINDINGS: Support lines and tubes remain in appropriate position. Small left pleural effusion has nearly completely resolved since previous study. No pneumothorax visualized. Moderate right pleural effusion shows no significant change. Heart size is stable. Diffuse interstitial infiltrates are seen, consistent with interstitial edema. IMPRESSION: Near complete resolution of left pleural effusion. No pneumothorax visualized. Stable moderate right pleural effusion, and diffuse interstitial edema. Electronically Signed   By: Danae Orleans M.D.   On: 02/15/2022 14:03   ECHOCARDIOGRAM COMPLETE  Result Date: 02/15/2022    ECHOCARDIOGRAM REPORT   Patient Name:   GENIVA LOHNES Dower Date of Exam: 02/15/2022 Medical Rec #:  [id number]   Height:       65.0 in Accession #:    [id number]  Weight:       117.9 lb Date of Birth:  04/26/1961   BSA:          1.580 m Patient Age:    60 years    BP:           121/82 mmHg Patient Gender: F           HR:           82 bpm. Exam Location:  Inpatient Procedure: 2D Echo STAT ECHO Indications:    dyspnea  History:        Patient has no prior history of Echocardiogram examinations.                 Sepsis; Risk Factors:Hypertension and Current Smoker.  Sonographer:    Delcie Roch RDCS Referring Phys: [id number] Ivor Costa MEIER IMPRESSIONS  1. Left ventricular ejection fraction, by estimation,  is >75%. The left ventricle has hyperdynamic function. The left ventricle has no regional wall motion abnormalities. Left ventricular diastolic parameters are consistent with Grade II diastolic dysfunction (pseudonormalization). Elevated left atrial pressure.  2. Right ventricular systolic function is mildly reduced. The right ventricular size is normal. Tricuspid regurgitation signal is inadequate for assessing PA pressure.  3. A small pericardial effusion is present. Large pleural effusion in the left lateral region.  4. The mitral valve is normal in structure. No evidence of mitral valve regurgitation.  5. The aortic valve is tricuspid. There is mild thickening of the aortic valve. Aortic valve regurgitation is not visualized. Aortic valve sclerosis is present, with no evidence of aortic valve stenosis.  6. The inferior vena cava is dilated in size with <50% respiratory variability, suggesting right atrial pressure of 15 mmHg. FINDINGS  Left Ventricle: Left ventricular ejection fraction, by estimation, is >75%. The left ventricle has hyperdynamic function. The left ventricle has no regional wall motion abnormalities. The left ventricular internal cavity size was normal in size. There is no left ventricular hypertrophy. Left ventricular diastolic parameters are consistent with Grade II diastolic dysfunction (pseudonormalization). Elevated left atrial pressure. Right Ventricle: The right ventricular size is normal. No increase in right ventricular wall thickness. Right ventricular systolic function is mildly reduced. Tricuspid regurgitation signal is inadequate for assessing PA pressure. Left Atrium: Left atrial size was normal in size. Right Atrium: Right atrial size was normal in size. Pericardium: A small pericardial effusion is present. Mitral Valve: The mitral valve is normal in structure. No evidence of mitral valve regurgitation. Tricuspid Valve: The tricuspid valve is normal in structure. Tricuspid valve  regurgitation is not demonstrated. Aortic Valve: The aortic valve is tricuspid. There is mild thickening of the aortic valve. Aortic valve regurgitation is not visualized. Aortic valve sclerosis is present, with no evidence of aortic valve stenosis. Pulmonic Valve: The pulmonic valve was normal in structure. Pulmonic valve regurgitation is trivial. Aorta: The aortic root and ascending aorta are structurally normal, with no evidence of dilitation. Venous: The inferior vena cava is dilated in size with less than 50% respiratory variability, suggesting right atrial pressure of 15 mmHg. IAS/Shunts: No atrial level shunt detected by color flow Doppler. Additional Comments: There is a large pleural effusion in the left lateral region.  LEFT VENTRICLE PLAX 2D LVIDd:         3.20 cm   Diastology LVIDs:         1.80 cm   LV e' medial:    5.10 cm/s LV PW:         0.90 cm   LV E/e' medial:  19.2 LV IVS:        0.80 cm   LV e' lateral:   4.80 cm/s LVOT diam:     2.00 cm   LV E/e' lateral: 20.4 LV SV:         63 LV SV Index:   40 LVOT Area:     3.14 cm  RIGHT VENTRICLE            IVC RV S prime:     9.15 cm/s  IVC diam: 2.20 cm TAPSE (M-mode): 1.2 cm LEFT ATRIUM           Index        RIGHT ATRIUM           Index LA diam:      2.00 cm 1.27 cm/m   RA Area:     10.20 cm LA Vol (A4C): 18.7 ml 11.83 ml/m  RA Volume:   21.30 ml  13.48 ml/m  AORTIC VALVE LVOT Vmax:   111.00 cm/s LVOT Vmean:  71.300 cm/s LVOT VTI:    0.200 m  AORTA Ao Root diam: 3.10 cm Ao Asc diam:  2.70 cm MITRAL VALVE MV Area (PHT): 2.80 cm     SHUNTS MV Decel Time: 271 msec     Systemic VTI:  0.20 m MV E velocity: 98.00 cm/s   Systemic Diam: 2.00 cm MV A velocity: 104.00 cm/s MV E/A ratio:  0.94 Mihai Croitoru MD Electronically signed by Sanda Klein MD Signature Date/Time: 02/15/2022/9:36:44 AM    Final    DG CHEST PORT 1 VIEW  Result Date: 02/15/2022 CLINICAL DATA:  61 year old female central line placement. Large pleural effusions. EXAM: PORTABLE CHEST  1 VIEW  COMPARISON:  CTA chest yesterday. FINDINGS: Portable AP semi upright view at 0513 hours. Right IJ approach central line placed. Tip is just below the carina at the lower SVC level. Endotracheal tube tip in good position between the clavicles and carina. Enteric tube courses to the abdomen. No pneumothorax. Left greater than right veiling pleural effusions, visible mediastinal contours, and overall lung ventilation appears stable from yesterday. IMPRESSION: 1. Right IJ central line placed with no adverse features. 2. Otherwise satisfactory lines and tubes. 3. Stable ventilation with left greater than right pleural effusions. Electronically Signed   By: Genevie Ann M.D.   On: 02/15/2022 05:48   [place] EKG SITE RITE  Result Date: 02/15/2022 If Site Rite image not attached, placement could not be confirmed due to current cardiac rhythm.  CT ABDOMEN PELVIS W CONTRAST  Result Date: 03/09/2022 CLINICAL DATA:  Chronic lymphocytic leukemia, history lymphoma with increasing lower extremity swelling, weight loss. Called EMS initially for abdominal pain and became unresponsive with EMS. EXAM: CT ABDOMEN AND PELVIS WITH CONTRAST TECHNIQUE: Multidetector CT imaging of the abdomen and pelvis was performed using the standard protocol following bolus administration of intravenous contrast. RADIATION DOSE REDUCTION: This exam was performed according to the departmental dose-optimization program which includes automated exposure control, adjustment of the mA and/or kV according to patient size and/or use of iterative reconstruction technique. CONTRAST:  163mL OMNIPAQUE IOHEXOL 350 MG/ML SOLN COMPARISON:  CT abdomen and pelvis with contrast 01/06/2020 and 06/25/2016, also CTA chest earlier today. FINDINGS: Lower chest: Large bilateral pleural effusions are again noted, on the left depressing the diaphragm and slightly shifting the mediastinum to the right with compressive collapse/consolidation of the lower lobes and portions of  the upper and right middle lobes. The cardiac size is normal. Multiple bulky masses in the low axillary regions on the left-greater-than-right are again shown. Largest visible mass on the left is 5.5 x 3.9 cm largest visible mass on the right is 3.9 x 2.3 cm. There are enlarged subcarinal and right hilar nodes partially visible mildly prominent left hilar nodes. There are multiple enlarged retrocrural lymph nodes largest of these is to the right measuring 2.5 x 1.2 cm (series 5 axial 26). Hepatobiliary: Status post cholecystectomy. No biliary dilatation is seen. There is intrahepatic periportal edema which is probably either due to fluid overload or hepatic dysfunction in this case given the interval marked weight loss. There is chronic 1.3 cm subcapsular hypodensity in the left lobe of the liver along side the gallbladder fossa unchanged and probably a small hemangioma or complex cyst. In the anterior segment of the right lobe of the liver on 5:44 there is a small flash filling hemangioma which was seen previously and unchanged. No hepatic mass enhancement is seen. The portal vein is patent within normal caliber limits. Pancreas: there are scattered coarse chronic calcifications in the pancreas consistent with chronic calcific pancreatitis but no findings of acute pancreatitis, mass or ductal dilatation. Spleen:There is heterogeneous splenic enhancement. There could be multiple small hypodense lesions within the spleen or the heterogeneity could be due to bolus phase phenomenon but the spleen is not enlarged for size. Adrenals/Urinary Tract: No adrenal or renal cortical mass enhancement. There is a 1.5 cm cyst of the posterior lower right kidney additional scattered bilateral subcentimeter hypodensities which are too small to characterize. There is no urinary stone or obstruction. The bladder is catheterized contracted and not well seen. Stomach/Bowel: NGT is coiled in the stomach with the tip in the body of  the  stomach. There are thickened folds in the stomach and small bowel no small bowel obstruction or gross inflammatory reaction. An appendix is not seen. There is moderate stool retention transverse and descending colon. Thickening of the ascending colon is noted versus underdistention. Vascular/Lymphatic: There is moderate to heavy aortoiliac calcification without aneurysm. The portal vein splenic vein and SMV are patent. There is extensive retroperitoneal adenopathy including in the periportal portacaval and retrocaval spaces with portacaval nodes up to 1.6 cm in short axis periportal nodes up to 1.8 cm in short axis and large confluent mantle of adenopathy extending along the retroperitoneum into in the kidneys lifting the aorta anteriorly encasing the aorta and IVC and continuing inferiorly along the pelvic sidewalls with additional bulky adenopathy in both sidewalls and both inguinal areas. The mantle of adenopathy extends from the level of the SMA inferior to the aortic bifurcation up to 15 cm in length and up to at least 10 cm coronal and 4.7 cm AP on 5:45. There is additional presacral coccygeal confluent adenopathy which is less well-defined but measures approximately 10 by 4 cm on 5:69. Bilateral bulky pelvic sidewall lymph nodes largest in the external chains are noted largest right external iliac chain node is 4.6 x 3.4 cm on 5:74 largest on the left 5.6 by 3.7 cm on 5:73 encasement of internal and external iliac vasculature without significant vascular narrowing. There is similar bulky bilateral inguinal chain adenopathy. Reproductive: The uterus is intact but not well seen. The ovaries are obscured by overlapping structures and adenopathy. Other: There is a small volume of abdominal and pelvic free ascites. No free air, hemorrhage or abscess is seen. There is diffuse mesenteric haziness and body wall anasarca. Interval weight loss consistent with cachexia. Musculoskeletal: There are degenerative changes in  the lumbar spine but no destructive osseous process. IMPRESSION: 1. Extensive abdominopelvic and inguinal adenopathy, most likely related to lymphoma/leukemia. 2. Numerous small hypodense splenic lesions versus bolus phase phenomenon. 3. Interval weight loss with very little body fat, appearance of cachexia, diffuse anasarca and small-volume abdominal and pelvic ascites. 4. Intrahepatic periportal edema which is probably either due to fluid overload or hepatic dysfunction. The portal vein is patent within normal caliber limits. 5. Chronic calcific pancreatitis but no findings of acute pancreatitis or ductal dilatation. 6. Ascending colitis versus nondistention, remainder of the colon with constipation. 7. Diffuse small intestinal wall thickening with gastric fold thickening, could relate to gastroenteritis or hepatic dysfunction versus reactive from ascites versus congestive or infiltrating disease. 8. Large low axillary masses, with subcarinal and hilar adenopathy. 9. Large pleural effusions. 10. Aortic atherosclerosis. Electronically Signed   By: Telford Nab M.D.   On: 03/05/2022 21:54   CT Head Wo Contrast  Result Date: 03/01/2022 CLINICAL DATA:  Mental status changes, unknown cause. EXAM: CT HEAD WITHOUT CONTRAST TECHNIQUE: Contiguous axial images were obtained from the base of the skull through the vertex without intravenous contrast. RADIATION DOSE REDUCTION: This exam was performed according to the departmental dose-optimization program which includes automated exposure control, adjustment of the mA and/or kV according to patient size and/or use of iterative reconstruction technique. COMPARISON:  MRI brain 02/01/2004 report. FINDINGS: Brain: There is slight cerebral cortical atrophy with mild-to-moderate small-vessel disease of the cerebral white matter, with white matter changes only minimally progressed from 18 years ago. There is a stable 7.4 x 6.1 x 5.1 mm hyperdense lesion along the roof of third  ventricle consistent with a colloid cyst and unchanged. There is a  mostly calcified extra-axial parasagittal high left frontal convexity meningioma measuring 1.8 x 1.5 x 1.3 cm, previously 1.5 x 0.9 cm, mildly deforming an underlying parasagittal superior left frontal gyrus but without underlying vasogenic edema. There are aneurysm coils with interval coiling of the previously noted left MCA bifurcation aneurysm. This causes moderate streak artifact through the adjacent temporal lobes and upper posterior fossa structures. Allowing for metallic artifacts there are no findings seen suspicious for acute cortical based infarct, hemorrhage, mass effect or midline shift. The cerebellum and brainstem are unremarkable. The ventricles are normal in size and position. Basal cisterns are clear. Vascular: There are scattered calcifications of the carotid siphons but no hyperdense central vasculature. Skull: No fracture or focal lesion. Sinuses/Orbits: There is patchy membrane thickening in the ethmoids. There is a 1 cm osteoma in the left frontal sinus. Other visualized sinuses are clear. No mastoid effusion. Other: None. IMPRESSION: 1. 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 Allowing for streak artifacts no acute intracranial process is suspected. 2. 1.8 x 1.5 x 1.3 cm mostly calcified meningioma in the parasagittal high left frontal convexity mildly deforming and underlying parasagittal superior frontal gyrus. This appears to have grown only minimally since 18 years ago. 3. Slight atrophy with mild-to-moderate small-vessel disease with only minimal progression of the small-vessel changes , no increased atrophy in the interval. 4. 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 5. Sinus membrane disease. Electronically Signed   By: Telford Nab M.D.   On: 03/14/2022 21:21   CT Angio Chest PE W and/or Wo Contrast  Result Date: 02/13/2022 CLINICAL DATA:  Unresponsive  EXAM: CT ANGIOGRAPHY CHEST WITH CONTRAST TECHNIQUE: Multidetector CT imaging of the chest was performed using the standard protocol during bolus administration of intravenous contrast. Multiplanar CT image reconstructions and MIPs were obtained to evaluate the vascular anatomy. RADIATION DOSE REDUCTION: This exam was performed according to the departmental dose-optimization program which includes automated exposure control, adjustment of the mA and/or kV according to patient size and/or use of iterative reconstruction technique. CONTRAST:  172mL OMNIPAQUE IOHEXOL 350 MG/ML SOLN COMPARISON:  CT 01/06/2020, chest x-ray 02/22/2022, CT chest 02/14/2016 FINDINGS: Cardiovascular: Satisfactory opacification of the pulmonary arteries to the segmental level. No evidence of pulmonary embolism. Aorta is nonaneurysmal. Mild atherosclerosis. No dissection. Normal cardiac size. No significant pericardial effusion. Mediastinum/Nodes: Midline trachea. Endotracheal tube tip several cm above the carina. Esophageal tube tip below the diaphragm but incompletely visualized. Numerous enlarged supraclavicular and axillary nodes, poorly defined due to lack of subcutaneous fat. Negative for thyroid mass. Ill-defined soft tissue density in the mediastinum, may reflect combination of fluid and adenopathy. Lungs/Pleura: Large bilateral pleural effusions with probable passive atelectasis of the lungs. No visible pneumothorax Upper Abdomen: Findings are dictated separately. Musculoskeletal: Sternum is intact. No definite acute osseous abnormality. Anasarca. Multiple large masses within the low axillary regions bilaterally, extending into the upper outer quadrant of the left breast. When compared to 2017, mostly fatty breast tissue was noted previously, today the breasts are diffusely infiltrated with fluid or soft tissue. This appears worse on the left side. Review of the MIP images confirms the above findings. IMPRESSION: 1. Negative for acute  pulmonary embolus. 2. Large bilateral pleural effusions with probable passive atelectasis in the lungs. 3. Lack of subcutaneous fat limits the exam. Extensive supraclavicular and lower neck adenopathy. Bilateral axillary adenopathy. Multiple large left greater than right axillary/chest wall masses, extending into the upper outer quadrants of the breasts,  presumably representing adenopathy however the breasts today appear diffusely infiltrated with either edema or soft tissue/mass, recommend correlation with physical exam. Collectively the findings are suspect for lymphoma or metastatic disease. 4. Anasarca Aortic Atherosclerosis (ICD10-I70.0). Electronically Signed   By: Donavan Foil M.D.   On: 02/18/2022 21:11   DG Chest Portable 1 View  Result Date: 02/21/2022 CLINICAL DATA:  Altered mental status EXAM: PORTABLE CHEST 1 VIEW COMPARISON:  01/09/2015 FINDINGS: Tip of endotracheal tube is 4.8 cm above the carina. Distal portion of enteric tube is seen in the stomach. There is moderate to large left pleural effusion some of which appears to be loculated along the lateral margin of left upper and left mid lung fields. Evaluation of left lower lung fields for infiltrates is limited by the effusion. There is small right pleural effusion. Linear density in the right mid lung fields may suggest fluid in the interlobar fissure or subsegmental atelectasis. Central pulmonary vessels are more prominent. IMPRESSION: Central pulmonary vessels are more prominent suggesting CHF. Moderate to large left pleural effusion. Small right pleural effusion. Electronically Signed   By: Elmer Picker M.D.   On: 03/12/2022 18:55     No future appointments.    LOS: 10 days

## 2022-02-24 NOTE — Progress Notes (Signed)
   02/22/2022 0004  Assess: MEWS Score  Temp 98.6 F (37 C)  BP (!) 150/99  MAP (mmHg) 113  Pulse Rate 91  Resp (!) 32  SpO2 95 %  O2 Device Nasal Cannula  Assess: MEWS Score  MEWS Temp 0  MEWS Systolic 0  MEWS Pulse 0  MEWS RR 2  MEWS LOC 0  MEWS Score 2  MEWS Score Color Yellow  Assess: if the MEWS score is Yellow or Red  Were vital signs taken at a resting state? Yes  Focused Assessment Change from prior assessment (see assessment flowsheet)  Does the patient meet 2 or more of the SIRS criteria? No  MEWS guidelines implemented *See Row Information* Yes  Treat  MEWS Interventions Escalated (See documentation below)  Pain Scale 0-10  Pain Score 0  Take Vital Signs  Increase Vital Sign Frequency  Yellow: Q 2hr X 2 then Q 4hr X 2, if remains yellow, continue Q 4hrs  Escalate  MEWS: Escalate Yellow: discuss with charge nurse/RN and consider discussing with provider and RRT  Notify: Charge Nurse/RN  Name of Charge Nurse/RN Notified  (Jewel Rimando,RN)  Date Charge Nurse/RN Notified 02/27/2022  Time Charge Nurse/RN Notified 0025  Notify: Provider  Provider Name/Title Bernadette Hoit  Date Provider Notified 03/02/2022  Time Provider Notified [phone]  Method of Notification Page  Notification Reason Other (Comment) (Yellow MEWS score)  Provider response No new orders;Other (Comment)  Date of Provider Response 02/13/2022  Time of Provider Response [phone]  Document  Patient Outcome Not stable and remains on department  Assess: SIRS CRITERIA  SIRS Temperature  0  SIRS Pulse 1  SIRS Respirations  1  SIRS WBC 0  SIRS Score Sum  2

## 2022-02-24 NOTE — Progress Notes (Signed)
PROGRESS NOTE    LYNCOLN MASKELL  SHF:[id number] DOB: 06-21-1961 DOA: 03/14/2022 PCP: Noreene Larsson, NP    Chief Complaint  Patient presents with   unresponsive    Brief Narrative:  Patient is a 61 year old female with pertinent PMH of CLL, depression, anxiety, HLD, HTN presents to [facility] ED on 6/2 unresponsive.  On 6/2 patient called EMS for abdominal pain.  Patient initially was alert and talking but became unresponsive with AMS.  Patient was hypoxic with agonal breathing and was placed on NRB.  Patient was noted to be hypotensive.  Patient transported to [facility] ED.  Upon arrival to [facility] ED patient remained unresponsive with agonal respirations.  Patient intubated for airway protection and hypoxia.  BP initially 81/65.  Given IV fluids.  Started on levo.  Fever 100.9 F and WBC 104.4.  Started on cefepime, Flagyl, Vanco.  Cultures pending.  Glucose 198, BNP 534, troponin 109 then 112, LA 4.4 then 1.8, UA unremarkable, UDS negative.  CXR with large left pleural effusion and small right pleural effusion.  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  CT abdomen with ascending colitis constipation, small intestine wall thickening possibly related to gastroenteritis; small volume ascites; extensive abdominopelvic and inguinal adenopathy.  PCCM consulted for ICU admission and transfer to [facility].   Assessment & Plan:   Principal Problem:   Sepsis ([place]) Active Problems:   Acute respiratory failure with hypoxia (HCC)   CLL (chronic lymphocytic leukemia) (HCC)   Shock (HCC)   Bilateral pleural effusion   Alteration in electrolyte and fluid balance   Chronic diastolic heart failure (HCC)   DNR (do not resuscitate)   Protein-calorie malnutrition, severe   Pressure injury of skin   Hyperkalemia  #1 acute respiratory failure with hypoxia/bilateral chylothorax -Chylothorax likely related to significant  adenopathy/CLL. -Patient noted to have bulky adenopathy in the mediastinum status post axillary lymph node biopsy. -Patient started on pulse dose steroids on 1 mg/kg-50 mg of prednisone started. -Status post tube placement on the right being managed per PCCM. -Chest tube noted to have migrated out of the chest and as such removed, -Per PCCM patient may need CT-guided drainage. -Patient started on octreotide per PCCM and attempt to slow chyle production as repeated bilateral trace drainage not obtainable long-term solution. -PCCM having dietitian to see how low-fat diet will also help reduce chyle flow. -Chest x-ray with worsening left-sided pleural effusion and patient underwent ultrasound-guided thoracentesis by PCCM on 03/07/2022 with 1400 cc of fluid removed with studies pending.  -Per PCCM patient may need to start treatment for CLL inpatient. -CT chest 02/22/2022 with recurrence of bilateral chylothorax and as such PCCM placing right-sided chest tube today. -Per PCCM note is noted that patient would like to have treatment for CLL/SLL and oncology informed. -Per PCCM.  2.  CLL/SLL -Status post axillary lymph node biopsy with biopsy results consistent with CLL/SLL.. -Patient started on pulse dose steroids. -Patient with a worsening leukocytosis. -Patient with recurrent chylothorax. -Per PCCM note patient would like to have treatment for CLL/SLL which she had refused in the past. -Per oncology.  3.  Chronic diastolic CHF grade 2/hypertension/hyperlipidemia -ACE inhibitor and diuretic currently on hold. -BP stable. -No signs of overt volume overload on examination. -Follow.  4.  Leukocytosis -Likely multifactorial secondary to CLL, pulse dose steroids. -Leukocytosis worsening in the setting of CLL and pulse dose steroids.. -Oncology following  and arranging outpatient follow-up and further work-up in the outpatient setting. -Per oncology. -Follow.  5.   Shock/hypotension/?Sepsis -Resolved.   -Questionable etiology patient treated as a sepsis with no obvious source.   -Status post 5 days antibiotics. -No further antibiotics needed  6.  Hyperglycemia -Likely secondary to steroids. -Hemoglobin A1c 6.5. -CBG 148 this morning. -Continue Semglee to 8 units daily.   -SSI.  -Diabetes coordinator following.  7.  Hyperkalemia -Status post Lokelma 10 mg twice daily x1 day.   -Potassium of 4.8 today.   8.  Pressure injury, POA Pressure Injury 02/17/22 Sacrum Medial Stage 1 -  Intact skin with non-blanchable redness of a localized area usually over a bony prominence. (Active)  02/17/22 0745  Location: Sacrum  Location Orientation: Medial  Staging: Stage 1 -  Intact skin with non-blanchable redness of a localized area usually over a bony prominence.  Wound Description (Comments):   Present on Admission: No       DVT prophylaxis: Lovenox Code Status: DNR Family Communication: Updated patient.  No family at bedside. Disposition: TBD  Status is: Inpatient Remains inpatient appropriate because: Severity of illness.   Consultants:  Interventional radiology: Dr. Vernard Gambles 02/17/2022 General surgery: Dr. Dema Severin 02/17/2022 Oncology: Dr. Lorenso Courier 02/16/2022    [facility] Events: Including procedures, antibiotic start and stop dates in addition to other pertinent events   6/2: Patient admitted to [facility] ED unresponsive; intubated; shock started on levo; transferred [facility]. CT chest and abd: 1. Extensive abdominopelvic and inguinal adenopathy, most likely related to lymphoma/leukemia. Numerous small hypodense splenic lesions versus bolus phase phenomenon. Intrahepatic periportal edema which is probably either due to fluid overload or hepatic dysfunction. The portal vein is patent within normal caliber limits. Chronic calcific pancreatitis but no findings of acute pancreatitis or ductal dilatation.  Ascending colitis versus nondistention, remainder  of the colon with constipation. Diffuse small intestinal wall thickening with gastric fold thickening, could relate to gastroenteritis or hepatic dysfunction versus reactive from ascites versus congestive or infiltrating disease. 6/3 right chest tube placed. Left thora 2000 ml removed.  6/4 extubated. ollowing commands. Lots of milky pleural fluid removed yesterday - exudative, cx negative, lymphocytic. 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 Off pressors 6/6 Transferred to [facility]       Procedures:  CT head 03/08/2022 CT angiogram chest 02/18/2022 CT abdomen and pelvis 02/25/2022 Chest x-ray 02/13/2022, 02/15/2022, 03/07/2022, 02/17/2022, 02/19/2022 2D echo 02/15/2022 Right axillary core lymph node biopsy 02/18/2022 Ultrasound-guided thoracentesis 03/03/2022--1400 cc of fluid removed Chest tube placement per PCCM, Dr. Tacy Learn 02/25/2022 CT chest 02/22/2022  Antimicrobials:  IV cefepime 02/28/2022>>>> 02/18/2022 IV Flagyl 02/13/2022>>>> 02/18/2022 IV vancomycin 03/09/2022>>> 02/15/2022   Subjective: Standing up in room working with therapy.  Patient with complaints of shortness of breath. No chest pain.  Tolerating current diet.   Objective: Vitals:   03/10/2022 1320 02/27/2022 1330 03/08/2022 1500 02/27/2022 1557  BP: (!) 141/81 113/78  127/87  Pulse: 96 96  91  Resp: (!) 31 (!) 31 (!) 25 18  Temp:    99.3 F (37.4 C)  TempSrc:    Oral  SpO2: 98% 99%  97%  Weight:      Height:        Intake/Output Summary (Last 24 hours) at 02/26/2022 1640 Last data filed at 02/23/2022 1500 Gross per 24 hour  Intake 480 ml  Output 3250 ml  Net -2770 ml    Filed Weights   02/23/22 0500 02/21/2022 0500 02/28/2022 0744  Weight:  48.1 kg 53.1 kg 51.9 kg    Examination:  General exam: NAD. Respiratory system: Decreased breath sounds in the bases bilaterally.  No wheezing.  Fair air movement. Cardiovascular system: RRR no murmurs rubs or gallops.  No JVD.  No lower extremity edema. Gastrointestinal  system: Abdomen is soft, nontender, positive bowel sounds, some tenderness to palpation in the mid to lower abdominal region.  Positive bowel sounds. Central nervous system: Alert and oriented. No focal neurological deficits. Extremities: Symmetric 5 x 5 power. Skin: No rashes, lesions or ulcers Psychiatry: Judgement and insight appear normal. Mood & affect appropriate.     Data Reviewed: I have personally reviewed following labs and imaging studies  CBC: Recent Labs  Lab 03/02/2022 0053 02/21/22 0710 02/22/22 0135 02/23/22 0031 03/03/2022 0413  WBC 85.7* 89.8* 96.8* 104.7* 106.3*  NEUTROABS 6.7 5.5 13.6* 5.2 5.4  HGB 11.1* 11.8* 12.0 13.0 12.3  HCT 35.9* 37.3 38.7 40.4 39.5  MCV 92.1 91.2 91.5 91.0 91.6  PLT 211 269 339 403* 498*     Basic Metabolic Panel: Recent Labs  Lab 02/19/22 0944 03/03/2022 0053 02/21/22 0710 02/22/22 0135 02/23/22 0031 03/03/2022 0413  NA 135 137 138 136 135 138  K 4.9 4.8 5.2* 4.7 5.3* 4.8  CL 106 103 103 102 100 101  CO2 $Re'25 29 28 31 28 30  'xQn$ GLUCOSE 367* 239* 93 128* 233* 144*  BUN 21 24* 22 25* 19 15  CREATININE 0.71 0.69 0.55 0.59 0.50 0.41*  CALCIUM 7.7* 7.8* 8.0* 7.8* 8.1* 8.0*  MG 2.1  --  1.9  --   --   --      GFR: Estimated Creatinine Clearance: 60.5 mL/min (A) (by C-G formula based on SCr of 0.41 mg/dL (L)).  Liver Function Tests: Recent Labs  Lab 02/18/22 0056 02/19/22 0944 02/21/22 0710  AST $Re'21 24 27  'raz$ ALT $R'16 18 20  'Oz$ ALKPHOS 79 77 77  BILITOT 0.5 0.3 0.1*  PROT 4.7* 4.8* 4.5*  ALBUMIN 2.1* 2.0* 1.8*     CBG: Recent Labs  Lab 02/23/22 1604 02/23/22 2235 02/28/2022 0746 02/18/2022 1135 02/21/2022 1558  GLUCAP 146* 322* 148* 183* 176*      Recent Results (from the past 240 hour(s))  Culture, blood (routine x 2)     Status: None   Collection Time: 02/20/2022  7:10 PM   Specimen: BLOOD RIGHT FOREARM  Result Value Ref Range Status   Specimen Description BLOOD RIGHT FOREARM BOTTLES DRAWN AEROBIC ONLY  Final   Special  Requests   Final    Blood Culture results may not be optimal due to an inadequate volume of blood received in culture bottles   Culture   Final    NO GROWTH 5 DAYS Performed at [facility], [address], [place], [place] [address]    Report Status 02/19/2022 FINAL  Final  Culture, blood (routine x 2)     Status: None   Collection Time: 03/06/2022  7:42 PM   Specimen: BLOOD RIGHT ARM  Result Value Ref Range Status   Specimen Description BLOOD RIGHT ARM BOTTLES DRAWN AEROBIC ONLY  Final   Special Requests Blood Culture adequate volume  Final   Culture   Final    NO GROWTH 5 DAYS Performed at [facility], [address], [place], [place] [address]    Report Status 02/19/2022 FINAL  Final  MRSA Next Gen by PCR, Nasal     Status: None   Collection Time: 02/15/22  1:20 AM  Specimen: Nasal Mucosa; Nasal Swab  Result Value Ref Range Status   MRSA by PCR Next Gen NOT DETECTED NOT DETECTED Final    Comment: (NOTE) The GeneXpert MRSA Assay (FDA approved for NASAL specimens only), is one component of a comprehensive MRSA colonization surveillance program. It is not intended to diagnose MRSA infection nor to guide or monitor treatment for MRSA infections. Test performance is not FDA approved in patients less than 84 years old. Performed at [facility], [place] [address], [place], [place] [address]   Urine Culture     Status: None   Collection Time: 02/15/22  1:53 AM   Specimen: Urine, Clean Catch  Result Value Ref Range Status   Specimen Description URINE, CLEAN CATCH  Final   Special Requests NONE  Final   Culture   Final    NO GROWTH Performed at [facility], [place] [address], [place], [place] [address]    Report Status 02/16/2022 FINAL  Final  Body fluid culture w Gram Stain     Status: None   Collection Time: 02/15/22  1:45 PM   Specimen: Pleura; Body Fluid  Result Value Ref Range Status   Specimen Description PLEURAL  Final   Special Requests NONE  Final   Gram  Stain   Final    FEW WBC PRESENT, PREDOMINANTLY MONONUCLEAR NO ORGANISMS SEEN    Culture   Final    NO GROWTH 3 DAYS Performed at [facility], [address], [place], [place] [address]    Report Status 02/18/2022 FINAL  Final  Culture, body fluid w Gram Stain-bottle     Status: None   Collection Time: 02/15/22  4:30 PM   Specimen: Pleura  Result Value Ref Range Status   Specimen Description PLEURAL FLUID RIGHT LUNG  Final   Special Requests NONE  Final   Culture   Final    NO GROWTH 5 DAYS Performed at [place] [address] [place], [place] [address]    Report Status 02/27/2022 FINAL  Final  Gram stain     Status: None   Collection Time: 02/15/22  4:30 PM   Specimen: Pleura  Result Value Ref Range Status   Specimen Description PLEURAL FLUID RIGHT LUNG  Final   Special Requests NONE  Final   Gram Stain   Final    NO SQUAMOUS EPITHELIAL CELLS SEEN FEW WBC SEEN NO ORGANISMS SEEN Performed at [facility], [place] [address] [place], [place] [address]    Report Status 02/15/2022 FINAL  Final  Body fluid culture w Gram Stain     Status: None   Collection Time: 03/02/2022  2:35 PM   Specimen: Pleural Fluid  Result Value Ref Range Status   Specimen Description PLEURAL  Final   Special Requests NONE  Final   Gram Stain   Final    FEW WBC PRESENT, PREDOMINANTLY PMN NO ORGANISMS SEEN    Culture   Final    NO GROWTH 3 DAYS Performed at [facility], [place] [address], [place], [place] [address]    Report Status 02/17/2022 FINAL  Final  Acid Fast Smear (AFB)     Status: None   Collection Time: 02/16/2022  2:35 PM   Specimen: Pleural, Left  Result Value Ref Range Status   AFB Specimen Processing Concentration  Final   Acid Fast Smear Negative  Final    Comment: (NOTE) Performed At: [facility] [address]  [address] [place], [place] [id number] Rush Farmer MD WH:[id number]    Source (AFB) PLEURAL  Final    Comment: Performed at [facility], [place] [address] [place], [place] [address]         Radiology Studies: DG Chest Port 1 View  Result Date: 02/23/2022 CLINICAL DATA:  Chest tube placement EXAM: PORTABLE CHEST 1 VIEW COMPARISON:  02/22/2022 FINDINGS: A right pigtail pleural drainage catheter is in place. The pneumothorax component of the right-sided hydropneumothorax is no longer appreciated. Reduced right pleural fluid. There is continued blunting of the right lateral costophrenic angle likely reflecting some residual pleural effusion. There is also a large left pleural effusion encompassing well over half of the left hemithorax. Atherosclerotic calcification of the aortic arch. IMPRESSION: 1. Substantially reduced right pleural fluid, status post pleural pigtail drainage catheter placement. The pneumothorax component of the right hydropneumothorax is no longer seen. 2. Large left pleural effusion. 3.  Aortic Atherosclerosis (ICD10-I70.0). Electronically Signed   By: Van Clines M.D.   On: 03/07/2022 13:48        Scheduled Meds:  chlorhexidine gluconate (MEDLINE KIT)  15 mL Mouth Rinse BID   docusate sodium  100 mg Oral BID   enoxaparin (LOVENOX) injection  40 mg Subcutaneous Q24H   feeding supplement  1 Container Oral TID PC & HS   insulin aspart  0-9 Units Subcutaneous TID WC   insulin glargine-yfgn  8 Units Subcutaneous Daily   multivitamin with minerals  1 tablet Oral Daily   octreotide  50 mcg Subcutaneous Daily   polyethylene glycol  17 g Oral Daily   predniSONE  50 mg Oral Q breakfast   sodium chloride flush  10-40 mL Intracatheter Q12H   Continuous Infusions:  sodium chloride 10 mL/hr at 02/17/22 0800     LOS: 10 days    Time spent: 35 minutes    Irine Seal, MD [facility]   To contact the attending provider between 7A-7P or the covering provider during after hours 7P-7A, please log into the web site [email or web address] and access using universal [place] password for that  web site. If you do not have the password, please call the hospital operator.  03/06/2022, 4:40 PM

## 2022-02-24 NOTE — Progress Notes (Signed)
Patient  has a respiration of 36 at 2157, 32 at 0004,and 31 at 0028,no c/o of shortness of breath,labored breathing, lungs sounds clear,no c/o of pain,has a yellow MEWS score MD notified.

## 2022-02-24 NOTE — TOC Initial Note (Signed)
Transition of Care [facility]) - Initial/Assessment Note    Patient Details  Name: [name] MRN: [id number] Date of Birth: 01-07-1961  Transition of Care [facility]) CM/SW Contact:    Coralee P[place], [place] Phone Number: 02/18/2022, 12:08 PM  Clinical Narrative:                 CSW was notified that pt's son wanted to discuss DC plans. CSW gained consent from pt to speak with son. Pt noted all plans could go through son. CSW spoke with Harrell Gave who confirmed pt lived independently PTA with supports from [facility], meals on wheels, grocery delivery and DME. Pt is substantial less able to care for herself at this time and family and pt are interested in [place]. Son will review [place] facilities and let CSW no which family is interested in. Pt is NMR at this time. TOC will continue to follow for DC needs.  Expected Discharge Plan: Long Term Nursing Home Barriers to Discharge: Continued Medical Work up, Ship broker, Other (must enter comment) (LTC placement)   Patient Goals and CMS Choice Patient states their goals for this hospitalization and ongoing recovery are:: Pt would like to be able to get better. CMS Medicare.gov Compare Post Acute Care list provided to:: Patient Represenative (must comment) Choice offered to / list presented to : Adult Children  Expected Discharge Plan and Services Expected Discharge Plan: Long Term Nursing Home     Post Acute Care Choice: Nursing Home Living arrangements for the past 2 months: Single Family Home                                      Prior Living Arrangements/Services Living arrangements for the past 2 months: Single Family Home Lives with:: Self Patient language and need for interpreter reviewed:: Yes Do you feel safe going back to the place where you live?: Yes      Need for Family Participation in Patient Care: Yes (Comment) Care giver support system in place?: Yes (comment) Current home services: Meals on wheels, Home RN,  DME Criminal Activity/Legal Involvement Pertinent to Current Situation/Hospitalization: No - Comment as needed  Activities of Daily Living      Permission Sought/Granted Permission sought to share information with : Family Supports Permission granted to share information with : Yes, Verbal Permission Granted  Share Information with NAME: Harrell Gave     Permission granted to share info w Relationship: Son  Permission granted to share info w Contact Information: [phone]  Emotional Assessment Appearance:: Appears older than stated age Attitude/Demeanor/Rapport: Engaged Affect (typically observed): Appropriate Orientation: : Oriented to Self, Oriented to Place, Oriented to  Time, Oriented to Situation Alcohol / Substance Use: Not Applicable Psych Involvement: No (comment)  Admission diagnosis:  CLL (chronic lymphocytic leukemia) ([place]) [C91.10] Sepsis ([place]) [A41.9] Sepsis, due to unspecified organism, unspecified whether acute organ dysfunction present [facility]) [A41.9] Acute respiratory failure with hypoxia ([place]) [J96.01] Patient Active Problem List   Diagnosis Date Noted   Hyperkalemia 02/23/2022   Protein-calorie malnutrition, severe 02/18/2022   Pressure injury of skin 02/18/2022   Alteration in electrolyte and fluid balance 02/17/2022   Chronic diastolic heart failure ([place]) 02/17/2022   DNR (do not resuscitate) 02/17/2022   Acute respiratory failure with hypoxia ([place]) 02/15/2022   CLL (chronic lymphocytic leukemia) ([place])    Shock ([place])    Bilateral pleural effusion    Sepsis ([place]) 02/13/2022  Encounter for screening for malignant neoplasm of cervix 02/08/2020   Encounter for screening for malignant neoplasm of breast 02/08/2020   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   Leg swelling 02/08/2020   Impaired fasting glucose 12/13/2019   Major depressive disorder, recurrent episode, moderate degree ([place]) 01/26/2017   Iron deficiency 11/17/2016   Tibial plateau fracture, left,  closed, initial encounter 09/16/2016   PVD (peripheral vascular disease) ([place]) 07/27/2016   Chronic lymphocytic leukemia (CLL), B-cell ([place]) 04/04/2016   Mass of right side of neck 01/21/2016   Lymphadenopathy 01/17/2016   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

## 2022-02-24 NOTE — Progress Notes (Signed)
NAME:  [name], MRN:  [id number], DOB:  06-24-1961, LOS: 28 ADMISSION DATE:  02/13/2022, CONSULTATION DATE:  6/2 REFERRING MD:  Dr. Alvino Chapel, CHIEF COMPLAINT:  Unresponsive; Acute resp failure   History of Present Illness:  Patient is a 61 year old female with pertinent PMH of CLL, depression, anxiety, HLD, HTN presents to [facility] ED on 6/2 unresponsive.  On 6/2 patient called EMS for abdominal pain.  Patient initially was alert and talking but became unresponsive with AMS.  Patient was hypoxic with agonal breathing and was placed on NRB.  Patient was noted to be hypotensive.  Patient transported to [facility] ED.  Upon arrival to [facility] ED patient remained unresponsive with agonal respirations.  Patient intubated for airway protection and hypoxia.  BP initially 81/65.  Given IV fluids.  Started on levo.  Fever 100.9 F and WBC 104.4.  Started on cefepime, Flagyl, Vanco.  Cultures pending.  Glucose 198, BNP 534, troponin 109 then 112, LA 4.4 then 1.8, UA unremarkable, UDS negative.  CXR with large left pleural effusion and small right pleural effusion.  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  CT abdomen with ascending colitis constipation, small intestine wall thickening possibly related to gastroenteritis; small volume ascites; extensive abdominopelvic and inguinal adenopathy.   PCCM was consulted for bilateral pleural effusion  Pertinent  Medical History   Past Medical History:  Diagnosis Date   Anxiety    Chronic abdominal pain    resolved   Chronic lymphocytic leukemia (CLL), B-cell ([place]) 04/04/2016   CLL (chronic lymphocytic leukemia) ([place])    Depression with anxiety 07/07/2015   Family history of hyperlipidemia 12/14/2019   H. pylori infection 2/09   s/p prevpac   HAND PAIN, BILATERAL 01/29/2009   Qualifier: Diagnosis of  By: Moshe Cipro MD, Margaret     Hypertension    Iron deficiency 11/17/2016    Lymphadenopathy 01/17/2016   Medical non-compliance 08/22/2017   Nicotine addiction    Psychosis ([place])    social worker states that patient does not like to talk about this and will stop her meds if you try and discuss this with her.   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 CT chest and abd: 1. Extensive abdominopelvic and inguinal adenopathy, most likely related to lymphoma/leukemia. Numerous small hypodense splenic lesions versus bolus phase phenomenon. Intrahepatic periportal edema which is probably either due to fluid overload or hepatic dysfunction. The portal vein is patent within normal caliber limits. Chronic calcific pancreatitis but no findings of acute pancreatitis or ductal dilatation.  Ascending colitis versus nondistention, remainder of the colon with constipation. Diffuse small intestinal wall thickening with gastric fold thickening, could relate to gastroenteritis or hepatic dysfunction versus reactive from ascites versus congestive or infiltrating disease. 6/3 right chest tube placed. Left thora 2000 ml removed.  6/4 extubated. ollowing commands. Lots of milky pleural fluid removed yesterday - exudative, cx negative, lymphocytic. 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 Off pressors 6/6 Transferred to [facility] 6/6 ultrasound-guided right axillary lymph node biopsy -Started on pulse dose steroids at 50 mg prednisone 6/7 chest tube output over a liter 6/8 left thoracentesis for 1400 cc of fluid  Interim History /  Subjective:  Patient stated breathing is getting worse, she is tachypneic currently on nasal cannula oxygen Denies chest pain, palpitation or other complaints  Objective   Blood pressure (!) 152/87, pulse 88, temperature  98.8 F (37.1 C), temperature source Oral, resp. rate 18, height '5\' 5"'$  (1.651 m), weight 51.9 kg, last menstrual period 07/25/2016, SpO2 93 %.        Intake/Output Summary (Last 24 hours) at 02/25/2022 1222 Last data filed at 02/13/2022 0840 Gross per 24 hour  Intake 240 ml  Output 550 ml  Net -310 ml   Filed Weights   02/23/22 0500 03/03/2022 0500 03/04/2022 0744  Weight: 48.1 kg 53.1 kg 51.9 kg   Physical Exam: General: Elderly, frail, cachectic female, lying on the bed HENT: Moist oral mucosa, Bulky, firm supraclavicular lymphadenopathy.  Eyes: Anicteric Respiratory:  dullness at both bases with decreased vocal fremitus. No egophony. No adventitial sounds.  Cardiovascular: S1-S2 appreciated Extremities: No extremity tenderness, venous stasis rash on both legs.  Neuro: alert and oriented with no focal deficits.   Assessment & Plan:  Bilateral chylothorax Acute hypoxic respiratory failure due to above CLL/SLL Chronic HFpEF due to uncontrolled hypertension  Patient looks tachypneic and in some respiratory distress CT chest showed recurrence of bilateral chylothorax After discussion with the patient and her son over the phone decision was to proceed with right-sided chest tube placement Patient would like to have treatment for CLL/SLL which she has refused in the past, primary team was informed Consent was obtained for chest tube Continue octreotide to in attempt to slow chyle production as repeated bilateral chest drainage not a tenable long-term solution.   RD to see, low fat diet would also help reduce chyle flow, but patient is already malnourished. Ultimately treatment of underlying cause if possible is necessary   Rest of the management per primary team    Jacky Kindle, MD [place] [facility] See Amion for pager If no response to pager, please call [phone] until 7pm After 7pm, Please call E-link [phone]

## 2022-02-24 NOTE — Progress Notes (Signed)
Mobility Specialist Progress Note:   02/18/2022 1000  Mobility  Activity  (repositioned in bed)  Level of Assistance Modified independent, requires aide device or extra time  Assistive Device None  Activity Response Tolerated well  $Mobility charge 1 Mobility   Responded to bed alarm, pt received with legs off bed. Able to redirect pt back to bed, repositioned to comfort, bed alarm on.  Nelta Numbers Acute Rehab Secure Chat or Office Phone: [phone]

## 2022-02-24 NOTE — Progress Notes (Signed)
Mobility Specialist Progress Note:   03/05/2022 1150  Mobility  Activity Ambulated with assistance in hallway  Level of Assistance Contact guard assist, steadying assist  Assistive Device Front wheel walker  Distance Ambulated (ft) 125 ft  Activity Response Tolerated well  $Mobility charge 1 Mobility   Pt eager for mobility session. Has been in bed since admission. Required steadying assist throughout. SpO2 83% on 2LO2 upon exertion. Recovered with rest+PLB. Pt back in bed eating lunch with all needs met. Encouraged OOB mobility often, pt agreed.  Nelta Numbers Acute Rehab Secure Chat or Office Phone: [phone]

## 2022-02-24 NOTE — Procedures (Addendum)
Insertion of Chest Tube Procedure Note  [name]  [id number]  28-Sep-1960  Date:02/19/2022  Time:1:35 PM    Provider Performing: Jacky Kindle   Procedure: Pleural Catheter Insertion w/ Imaging Guidance ([id number])  Indication(s) Effusion/Chylothorax  Consent Risks of the procedure as well as the alternatives and risks of each were explained to the patient and/or caregiver.  Consent for the procedure was obtained and is signed in the bedside chart  Anesthesia Topical only with 1% lidocaine    Time Out Verified patient identification, verified procedure, site/side was marked, verified correct patient position, special equipment/implants available, medications/allergies/relevant history reviewed, required imaging and test results available.   Sterile Technique Maximal sterile technique including full sterile barrier drape, hand hygiene, sterile gown, sterile gloves, mask, hair covering, sterile ultrasound probe cover (if used).   Procedure Description Ultrasound used to identify appropriate pleural anatomy for placement and overlying skin marked. Area of placement cleaned and draped in sterile fashion.  A 14 French pigtail pleural catheter was placed into the right pleural space using Seldinger technique. Appropriate return of fluid was obtained.  The tube was connected to atrium and placed on -20 cm H2O wall suction.   Complications/Tolerance None; patient tolerated the procedure well. Chest X-ray is ordered to verify placement.   EBL Minimal  Specimen(s) none

## 2022-02-24 NOTE — Progress Notes (Signed)
Nutrition Follow-up  DOCUMENTATION CODES:  Severe malnutrition in context of chronic illness, Underweight  INTERVENTION:  Continue ordering meals so provide pt with low fat diet (<20g/d), preferences obtained. May need to consider NPO status and TPN if pleural fluid output remains > 500 ml per day. Continue MVI EFA supplementation may be needed if low fat diet is required for > 5-7 days (currently day 5) Increase Boost Breeze po to QID, each supplement provides 250 kcal and 9 grams of protein Discontinue carnation instant breakfast  NUTRITION DIAGNOSIS:  Severe Malnutrition related to chronic illness (CLL) as evidenced by severe muscle depletion, severe fat depletion. - remains applicable  GOAL:  Patient will meet greater than or equal to 90% of their needs - progressing, diet in place, chest tube out  MONITOR:  PO intake, Supplement acceptance, Labs  REASON FOR ASSESSMENT:  Consult Assessment of nutrition requirement/status  ASSESSMENT:  61 yo female admitted with AMS and unresponsiveness. PMH includes CLL, depression, anxiety, HLD, HTN, lymphadenopathy, iron deficiency.  6/2 - Admitted unresponsive, intubated 6/3 right chest tube placed. Left thora 2L removed.  6/4 extubated 6/8 - thoracentesis 1.4L removed 6/10 - chest tube removed 6/12 - right chest tube replaced, 3.1L drained  Pt just returning to room after chest tube replacement. Discussed intake, pt reports she doesn't have much of an appetite but that she has been eating what is sent to her. Does not like the carnation instant breakfast, does like the boost breeze. Obtained meal preferences to optimize intake.  Average Meal Intake: 6/5-6/12: 92% intake x 5 recorded meals  Nutritionally Relevant Medications: Scheduled Meds:  docusate sodium  100 mg Oral BID   Boost Breeze  1 Container Oral TID BM   insulin aspart  0-9 Units Subcutaneous TID WC   insulin glargine-yfgn  8 Units Subcutaneous Daily   multivitamin  with minerals  1 tablet Oral Daily   octreotide  50 mcg Subcutaneous Daily   polyethylene glycol  17 g Oral Daily   predniSONE  50 mg Oral Q breakfast   PRN Meds: ondansetron   Labs Reviewed  NUTRITION - FOCUSED PHYSICAL EXAM: Flowsheet Row Most Recent Value  Orbital Region Moderate depletion  Upper Arm Region Moderate depletion  Thoracic and Lumbar Region Severe depletion  Buccal Region Severe depletion  Temple Region Severe depletion  Clavicle Bone Region Severe depletion  Clavicle and Acromion Bone Region Severe depletion  Scapular Bone Region Severe depletion  Dorsal Hand Severe depletion  Patellar Region Severe depletion  Anterior Thigh Region Severe depletion  Posterior Calf Region Mild depletion  Edema (RD Assessment) Moderate  Hair Reviewed  Eyes Reviewed  Mouth Reviewed  Skin Reviewed  Nails Reviewed   Diet Order:   Diet Order             Diet regular Room service appropriate? Yes; Fluid consistency: Thin  Diet effective now                   EDUCATION NEEDS:  Education needs have been addressed  Skin:  Skin Assessment: Skin Integrity Issues: Skin Integrity Issues:: Stage I Stage I: sacrum  Last BM:  6/6  Height:  Ht Readings from Last 1 Encounters:  02/23/2022 '5\' 5"'$  (1.651 m)    Weight:  Wt Readings from Last 1 Encounters:  02/22/2022 51.9 kg    Ideal Body Weight:  56.8 kg  BMI:  Body mass index is 19.04 kg/m.  Estimated Nutritional Needs:  Kcal:  1700-1900 Protein:  70-90 gm  Fluid:  1.7-1.9 L   Ranell Patrick, RD, LDN Clinical Dietitian RD pager # available in [facility]  After hours/weekend pager # available in [facility]

## 2022-02-24 NOTE — Progress Notes (Signed)
Inpatient Diabetes Program Recommendations  AACE/ADA: New Consensus Statement on Inpatient Glycemic Control   Target Ranges:  Prepandial:   less than 140 mg/dL      Peak postprandial:   less than 180 mg/dL (1-2 hours)      Critically ill patients:  140 - 180 mg/dL    Latest Reference Range & Units 02/23/22 07:34 02/23/22 12:28 02/23/22 16:04 02/23/22 22:35 02/23/2022 07:46  Glucose-Capillary 70 - 99 mg/dL 125 (H) 384 (H) 146 (H) 322 (H) 148 (H)   Review of Glycemic Control  Current orders for Inpatient glycemic control: Semglee 8 units daily, Novolog 0-9 units TID with meals; Prednisone 50 mg QAM  Inpatient Diabetes Program Recommendations:    Insulin: If steroids are continued, please consider ordering Novolog 2 units TID with meals for meal coverage if patient eats at least 50% of meals.   Thanks, Barnie Alderman, RN, MSN, [place] Diabetes Coordinator Inpatient Diabetes Program [phone] (Team Pager from 8am to [place])

## 2022-02-25 ENCOUNTER — Inpatient Hospital Stay: Payer: Self-pay

## 2022-02-25 DIAGNOSIS — C9110 Chronic lymphocytic leukemia of B-cell type not having achieved remission: Secondary | ICD-10-CM | POA: Diagnosis not present

## 2022-02-25 DIAGNOSIS — E43 Unspecified severe protein-calorie malnutrition: Secondary | ICD-10-CM | POA: Diagnosis not present

## 2022-02-25 DIAGNOSIS — J90 Pleural effusion, not elsewhere classified: Secondary | ICD-10-CM | POA: Diagnosis not present

## 2022-02-25 DIAGNOSIS — I5032 Chronic diastolic (congestive) heart failure: Secondary | ICD-10-CM | POA: Diagnosis not present

## 2022-02-25 DIAGNOSIS — A419 Sepsis, unspecified organism: Secondary | ICD-10-CM | POA: Diagnosis not present

## 2022-02-25 DIAGNOSIS — J9601 Acute respiratory failure with hypoxia: Secondary | ICD-10-CM | POA: Diagnosis not present

## 2022-02-25 LAB — BASIC METABOLIC PANEL
Anion gap: 5 (ref 5–15)
BUN: 20 mg/dL (ref 8–23)
CO2: 30 mmol/L (ref 22–32)
Calcium: 7.8 mg/dL — ABNORMAL LOW (ref 8.9–10.3)
Chloride: 102 mmol/L (ref 98–111)
Creatinine, Ser: 0.43 mg/dL — ABNORMAL LOW (ref 0.44–1.00)
GFR, Estimated: >60 mL/min (ref >60)
Glucose, Bld: 148 mg/dL — ABNORMAL HIGH (ref 70–99)
Potassium: 5.1 mmol/L (ref 3.5–5.1)
Sodium: 137 mmol/L (ref 135–145)

## 2022-02-25 LAB — CBC WITH DIFFERENTIAL/PLATELET
Abs Immature Granulocytes: 0.22 10*3/uL — ABNORMAL HIGH (ref 0.00–0.07)
Basophils Absolute: 0.1 10*3/uL (ref 0.0–0.1)
Basophils Relative: 0 %
Eosinophils Absolute: 0 10*3/uL (ref 0.0–0.5)
Eosinophils Relative: 0 %
HCT: 36.3 % (ref 36.0–46.0)
Hemoglobin: 11 g/dL — ABNORMAL LOW (ref 12.0–15.0)
Immature Granulocytes: 0 %
Lymphocytes Relative: 91 %
Lymphs Abs: 84.3 10*3/uL — ABNORMAL HIGH (ref 0.7–4.0)
MCH: 28.3 pg (ref 26.0–34.0)
MCHC: 30.3 g/dL (ref 30.0–36.0)
MCV: 93.3 fL (ref 80.0–100.0)
Monocytes Absolute: 0.5 10*3/uL (ref 0.1–1.0)
Monocytes Relative: 1 %
Neutro Abs: 7 10*3/uL (ref 1.7–7.7)
Neutrophils Relative %: 8 %
Platelets: 474 10*3/uL — ABNORMAL HIGH (ref 150–400)
RBC: 3.89 MIL/uL (ref 3.87–5.11)
RDW: 17.5 % — ABNORMAL HIGH (ref 11.5–15.5)
WBC: 92.1 10*3/uL (ref 4.0–10.5)
nRBC: 0 % (ref 0.0–0.2)

## 2022-02-25 LAB — GLUCOSE, CAPILLARY
Glucose-Capillary: 134 mg/dL — ABNORMAL HIGH (ref 70–99)
Glucose-Capillary: 131 mg/dL — ABNORMAL HIGH (ref 70–99)
Glucose-Capillary: 230 mg/dL — ABNORMAL HIGH (ref 70–99)
Glucose-Capillary: 116 mg/dL — ABNORMAL HIGH (ref 70–99)

## 2022-02-25 LAB — MAGNESIUM: Magnesium: 1.9 mg/dL (ref 1.7–2.4)

## 2022-02-25 MED ORDER — SODIUM CHLORIDE 0.9% FLUSH
10.0000 mL | Freq: Two times a day (BID) | INTRAVENOUS | Status: DC
  Administered 2022-02-25 – 2022-03-08 (×20): 10 mL
  Administered 2022-03-09 – 2022-03-10 (×2): 20 mL
  Administered 2022-03-11 – 2022-03-14 (×7): 10 mL
  Administered 2022-03-14: 20 mL
  Administered 2022-03-16 – 2022-03-18 (×3): 10 mL

## 2022-02-25 MED ORDER — CHLORHEXIDINE GLUCONATE CLOTH 2 % EX PADS
6.0000 | MEDICATED_PAD | Freq: Every day | CUTANEOUS | Status: DC
  Administered 2022-02-25 – 2022-03-25 (×26): 6 via TOPICAL

## 2022-02-25 MED ORDER — OCTREOTIDE ACETATE 50 MCG/ML IJ SOLN
50.0000 ug | Freq: Three times a day (TID) | INTRAMUSCULAR | Status: DC
Start: 2022-02-25 — End: 2022-03-26
  Administered 2022-02-25 – 2022-03-25 (×83): 50 ug via SUBCUTANEOUS
  Filled 2022-02-25 (×98): qty 1

## 2022-02-25 MED ORDER — SODIUM CHLORIDE 0.9% FLUSH
10.0000 mL | Freq: Three times a day (TID) | INTRAVENOUS | Status: DC
  Administered 2022-02-25 – 2022-03-02 (×15): 10 mL

## 2022-02-25 MED ORDER — SODIUM CHLORIDE 0.9% FLUSH
10.0000 mL | INTRAVENOUS | Status: DC | PRN
  Administered 2022-02-27 – 2022-03-17 (×5): 10 mL

## 2022-02-25 MED ORDER — TRACE MINERALS CU-MN-SE-ZN 300-55-60-3000 MCG/ML IV SOLN
INTRAVENOUS | Status: AC
  Filled 2022-02-25: qty 500

## 2022-02-25 MED ORDER — INSULIN ASPART 100 UNIT/ML IJ SOLN
0.0000 [IU] | INTRAMUSCULAR | Status: DC
  Administered 2022-02-25: 1 [IU] via SUBCUTANEOUS
  Administered 2022-02-25: 3 [IU] via SUBCUTANEOUS
  Administered 2022-02-26: 5 [IU] via SUBCUTANEOUS
  Administered 2022-02-26: 2 [IU] via SUBCUTANEOUS
  Administered 2022-02-26 (×2): 5 [IU] via SUBCUTANEOUS
  Administered 2022-02-26: 1 [IU] via SUBCUTANEOUS
  Administered 2022-02-27: 7 [IU] via SUBCUTANEOUS
  Administered 2022-02-27 (×2): 2 [IU] via SUBCUTANEOUS
  Administered 2022-02-27: 3 [IU] via SUBCUTANEOUS
  Administered 2022-02-28: 2 [IU] via SUBCUTANEOUS
  Administered 2022-02-28 (×2): 3 [IU] via SUBCUTANEOUS
  Administered 2022-02-28: 1 [IU] via SUBCUTANEOUS
  Administered 2022-02-28: 2 [IU] via SUBCUTANEOUS
  Administered 2022-02-28: 5 [IU] via SUBCUTANEOUS
  Administered 2022-03-01: 2 [IU] via SUBCUTANEOUS
  Administered 2022-03-01: 3 [IU] via SUBCUTANEOUS
  Administered 2022-03-01: 2 [IU] via SUBCUTANEOUS
  Administered 2022-03-01: 7 [IU] via SUBCUTANEOUS

## 2022-02-25 NOTE — Progress Notes (Signed)
NAME:  [name], MRN:  [id number], DOB:  [birth year], LOS: 84 ADMISSION DATE:  02/19/2022, CONSULTATION DATE:  6/2 REFERRING MD:  Dr. Alvino Chapel, CHIEF COMPLAINT:  Unresponsive; Acute resp failure   History of Present Illness:  Patient is a 61 year old female with pertinent PMH of CLL, depression, anxiety, HLD, HTN presents to [facility] ED on 6/2 unresponsive.  On 6/2 patient called EMS for abdominal pain.  Patient initially was alert and talking but became unresponsive with AMS.  Patient was hypoxic with agonal breathing and was placed on NRB.  Patient was noted to be hypotensive.  Patient transported to [facility] ED.  Upon arrival to [facility] ED patient remained unresponsive with agonal respirations.  Patient intubated for airway protection and hypoxia.  BP initially 81/65.  Given IV fluids.  Started on levo.  Fever 100.9 F and WBC 104.4.  Started on cefepime, Flagyl, Vanco.  Cultures pending.  Glucose 198, BNP 534, troponin 109 then 112, LA 4.4 then 1.8, UA unremarkable, UDS negative.  CXR with large left pleural effusion and small right pleural effusion.  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  CT abdomen with ascending colitis constipation, small intestine wall thickening possibly related to gastroenteritis; small volume ascites; extensive abdominopelvic and inguinal adenopathy.   PCCM was consulted for bilateral pleural effusion  Pertinent  Medical History   Past Medical History:  Diagnosis Date   Anxiety    Chronic abdominal pain    resolved   Chronic lymphocytic leukemia (CLL), B-cell ([place]) 04/04/2016   CLL (chronic lymphocytic leukemia) ([place])    Depression with anxiety 07/07/2015   Family history of hyperlipidemia 12/14/2019   H. pylori infection 2/09   s/p prevpac   HAND PAIN, BILATERAL 01/29/2009   Qualifier: Diagnosis of  By: Moshe Cipro MD, Margaret     Hypertension    Iron deficiency 11/17/2016    Lymphadenopathy 01/17/2016   Medical non-compliance 08/22/2017   Nicotine addiction    Psychosis ([place])    social worker states that patient does not like to talk about this and will stop her meds if you try and discuss this with her.   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 CT chest and abd: 1. Extensive abdominopelvic and inguinal adenopathy, most likely related to lymphoma/leukemia. Numerous small hypodense splenic lesions versus bolus phase phenomenon. Intrahepatic periportal edema which is probably either due to fluid overload or hepatic dysfunction. The portal vein is patent within normal caliber limits. Chronic calcific pancreatitis but no findings of acute pancreatitis or ductal dilatation.  Ascending colitis versus nondistention, remainder of the colon with constipation. Diffuse small intestinal wall thickening with gastric fold thickening, could relate to gastroenteritis or hepatic dysfunction versus reactive from ascites versus congestive or infiltrating disease. 6/3 right chest tube placed. Left thora 2000 ml removed.  6/4 extubated. ollowing commands. Lots of milky pleural fluid removed yesterday - exudative, cx negative, lymphocytic. 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 Off pressors 6/6 Transferred to [facility] 6/6 ultrasound-guided right axillary lymph node biopsy -Started on pulse dose steroids at 50 mg prednisone 6/7 chest tube output over a liter 6/8 left thoracentesis for 1400 cc of fluid  Interim History /  Subjective:   Saturation 100% on 3 L. Afebrile    Objective   Blood pressure 108/76, pulse 95, temperature 98.4 F (36.9 C), temperature source Oral, resp. rate 18, height '5\' 5"'$  (1.651 m), weight 51.9 kg, last  menstrual period 07/25/2016, SpO2 100 %.        Intake/Output Summary (Last 24 hours) at 02/25/2022 1051 Last data filed at 02/25/2022 0811 Gross per 24 hour  Intake 900 ml  Output 3620 ml  Net -2720 ml    Filed Weights   02/23/22 0500 03/12/2022 0500 03/08/2022 0744  Weight: 48.1 kg 53.1 kg 51.9 kg   Physical Exam: General: Elderly, frail, cachectic female, lying on the bed HENT: Moist oral mucosa, Bulky, firm supraclavicular lymphadenopathy.  Eyes: Anicteric Respiratory: Decreased breath sounds bilateral, right chest tube has milky fluid draining, no air leak in Pleur-evac Cardiovascular: S1-S2 appreciated Extremities: No extremity tenderness, venous stasis rash on both legs.  Neuro: alert and oriented with no focal deficits.    Labs show mild hypokalemia, leukocytosis  Chest x-ray 6/12 independently reviewed shows large left effusion, decreased right effusion with pleural catheter in position  Assessment & Plan:  Bilateral chylothorax Acute hypoxic respiratory failure due to above  Left effusion is worsening, right chest tube drained 3 L of [place] last 24 hours -offered her left-sided chest tube but she would like to hold off Continue octreotide to in attempt to slow chyle production as repeated bilateral chest drainage not a tenable long-term solution.    Protein calorie malnutrition, moderate to severe -Discussed with dietitian, start TNA  CLL/SLL -Per oncology, on prednisone Rituximab being considered  Chronic HFpEF due to uncontrolled hypertension Rest of the management per primary team    Kara Mead MD. FCCP. [place] [facility] Pager : [phone]  If no response to pager , please call [phone] until 7 pm After 7:00 pm call [place]  [phone]   02/25/2022

## 2022-02-25 NOTE — Progress Notes (Signed)
PHARMACY - TOTAL PARENTERAL NUTRITION CONSULT NOTE   Indication:  bilateral chylothorax with high chest tube output  Patient Measurements: Height: '5\' 5"'$  (165.1 cm) Weight: 51.9 kg (114 lb 6.7 oz) IBW/kg (Calculated) : 57 TPN AdjBW (KG): 64 Body mass index is 19.04 kg/m.  Assessment: 61 year old female with pertinent PMH of CLL, depression, anxiety, HLD, HTN presents to [facility] ED on 6/2 unresponsive.  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 Patient also has bilateral chylothorax with high output.  Pharmacy has been consulted to start TPN.  Glucose / Insulin: required 3 units of SSI plus 8 units of semglee in the previous 24 hours.On steroids Electrolytes: Na 137, K 5.1, Mag 1.9 Renal: Scr < 1 Hepatic: WNL on 6/9 Intake / Output; MIVF: UOP 0.3 ml/kg/hr, chest tube 3,420 mls  GI Imaging: 6/10 CT Chest Bilateral large pleural effusions and bulky axillary and lower cervical lymphadenopathy  GI Surgeries / Procedures:   Central access: PICC to be placed 6/13 TPN start date: 6/13  Nutritional Goals: Goal TPN rate is 100 mL/hr (provides 150 g of protein and 2300 kcals per day)  RD Assessment: Estimated Needs Total Energy Estimated Needs: 2300-2600 Total Protein Estimated Needs: 140-160 grams Total Fluid Estimated Needs: >2.5 L  Current Nutrition:  NPO  Plan:  Start TPN at 83m/hr at 1800 Electrolytes in TPN: Na 51m/L, K 2561mL, Ca 5mE4m, Mg 5mEq95m and Phos 15mmo27m Cl:Ac 1:1 Add standard MVI and trace elements to TPN Initiate Sensitive q4h SSI and adjust as needed  Monitor TPN labs on Mon/Thurs, 6/14  Cathy Alanda SlimmD, FCCM [facility] Pharmacist Please see AMION for all Pharmacists' Contact Phone Numbers 02/25/2022, 12:29 PM

## 2022-02-25 NOTE — Progress Notes (Addendum)
PROGRESS NOTE    [name]  EAV:[id number] DOB: 11-Sep-1961 DOA: 03/13/2022 PCP: Noreene Larsson, NP    Chief Complaint  Patient presents with   unresponsive    Brief Narrative:  Patient is a 61 year old female with pertinent PMH of CLL, depression, anxiety, HLD, HTN presents to [facility] ED on 6/2 unresponsive.  On 6/2 patient called EMS for abdominal pain.  Patient initially was alert and talking but became unresponsive with AMS.  Patient was hypoxic with agonal breathing and was placed on NRB.  Patient was noted to be hypotensive.  Patient transported to [facility] ED.  Upon arrival to [facility] ED patient remained unresponsive with agonal respirations.  Patient intubated for airway protection and hypoxia.  BP initially 81/65.  Given IV fluids.  Started on levo.  Fever 100.9 F and WBC 104.4.  Started on cefepime, Flagyl, Vanco.  Cultures pending.  Glucose 198, BNP 534, troponin 109 then 112, LA 4.4 then 1.8, UA unremarkable, UDS negative.  CXR with large left pleural effusion and small right pleural effusion.  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  CT abdomen with ascending colitis constipation, small intestine wall thickening possibly related to gastroenteritis; small volume ascites; extensive abdominopelvic and inguinal adenopathy.  PCCM consulted for ICU admission and transfer to [facility]   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 -Patient noted to have bulky adenopathy in the mediastinum status post axillary lymph node biopsy. -Patient started on pulse dose steroids on 1 mg/kg-50 mg of prednisone started. -Status post tube placement on the right being managed per PCCM. -Chest tube noted to have migrated out of the chest and as such removed, -Per PCCM patient may need CT-guided drainage. -Patient started on octreotide per PCCM and attempt to slow chyle production as repeated bilateral trace drainage not obtainable long-term solution. -PCCM having dietitian to see how low-fat diet will also help reduce chyle flow. -Dietitian recommending starting on TNA and as such PICC line ordered. -Chest x-ray with worsening left-sided pleural effusion and patient underwent ultrasound-guided thoracentesis by PCCM on 02/28/2022 with 1400 cc of fluid removed with studies pending.  -Per PCCM patient may need to start treatment for CLL inpatient. -CT chest 02/22/2022 with recurrence of bilateral chylothorax and as such PCCM placed right-sided chest tube 02/26/2022. -Patient on octreotide. -Per PCCM note is noted that patient would like to have treatment for CLL/SLL and oncology informed. -Per PCCM.  2.  CLL/SLL -Status post axillary lymph node biopsy with biopsy results consistent with CLL/SLL.. -Patient started on pulse dose steroids. -Patient with a worsening leukocytosis. -Patient with recurrent chylothorax. -Per PCCM note patient would like to have treatment for CLL/SLL which she had refused in the past. -Per oncology.  3.  Chronic diastolic CHF grade 2/hypertension/hyperlipidemia -ACE inhibitor and diuretic currently on hold. -BP stable. -No signs of overt volume overload on examination. -Follow.  4.  Leukocytosis -Likely multifactorial secondary to CLL, pulse dose  steroids. -Leukocytosis worsening in the setting of CLL and pulse dose steroids.. -Oncology following and arranging outpatient follow-up and further  work-up in the outpatient setting. -Per oncology. -Follow.  5.  Shock/hypotension/?Sepsis -Resolved.   -Questionable etiology patient treated as a sepsis with no obvious source.   -Status post 5 days antibiotics. -No further antibiotics needed  6.  Hyperglycemia -Likely secondary to steroids. -Hemoglobin A1c 6.5. -CBG 134 this morning. -Continue Semglee to 8 units daily.   -SSI.  -Diabetes coordinator following.  7.  Hyperkalemia -Status post Lokelma 10 mg twice daily x1 day.   -Potassium of 5.1.  8.  Severe malnutrition -Patient seen by dietitian, PICC line placed. -Patient to be started on TPN.  9.  Pressure injury, POA Pressure Injury 02/17/22 Sacrum Medial Stage 1 -  Intact skin with non-blanchable redness of a localized area usually over a bony prominence. (Active)  02/17/22 0745  Location: Sacrum  Location Orientation: Medial  Staging: Stage 1 -  Intact skin with non-blanchable redness of a localized area usually over a bony prominence.  Wound Description (Comments):   Present on Admission: No       DVT prophylaxis: Lovenox Code Status: DNR Family Communication: Updated patient.  No family at bedside. Disposition: TBD  Status is: Inpatient Remains inpatient appropriate because: Severity of illness.   Consultants:  Interventional radiology: Dr. Vernard Gambles 02/17/2022 General surgery: Dr. Dema Severin 02/17/2022 Oncology: Dr. Lorenso Courier 02/16/2022    [facility] Events: Including procedures, antibiotic start and stop dates in addition to other pertinent events   6/2: Patient admitted to [facility] ED unresponsive; intubated; shock started on levo; transferred to [facility]. CT chest and abd: 1. Extensive abdominopelvic and inguinal adenopathy, most likely related to lymphoma/leukemia. Numerous small hypodense splenic lesions versus bolus phase phenomenon. Intrahepatic periportal edema which is probably either due to fluid overload or hepatic dysfunction. The portal vein is patent  within normal caliber limits. Chronic calcific pancreatitis but no findings of acute pancreatitis or ductal dilatation.  Ascending colitis versus nondistention, remainder of the colon with constipation. Diffuse small intestinal wall thickening with gastric fold thickening, could relate to gastroenteritis or hepatic dysfunction versus reactive from ascites versus congestive or infiltrating disease. 6/3 right chest tube placed. Left thora 2000 ml removed.  6/4 extubated. ollowing commands. Lots of milky pleural fluid removed yesterday - exudative, cx negative, lymphocytic. 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 Off pressors 6/6 Transferred to [facility]       Procedures:  CT head 03/13/2022 CT angiogram chest 02/18/2022 CT abdomen and pelvis 02/22/2022 Chest x-ray 02/15/2022, 02/15/2022, 02/23/2022, 02/17/2022, 02/19/2022 2D echo 02/15/2022 Right axillary core lymph node biopsy 02/18/2022 Ultrasound-guided thoracentesis 03/14/2022--1400 cc of fluid removed Chest tube placement per PCCM, Dr. Tacy Learn 02/19/2022 CT chest 02/22/2022 PICC line placement 02/25/2022  Antimicrobials:  IV cefepime 03/13/2022>>>> 02/18/2022 IV Flagyl 03/14/2022>>>> 02/18/2022 IV vancomycin 03/10/2022>>> 02/15/2022   Subjective: Sitting up in bed.  States some improvement in shortness of breath after chest tube placement yesterday.  No chest pain.   Objective: Vitals:   03/09/2022 1557 03/09/2022 2051 02/25/22 0457 02/25/22 0808  BP: 127/87 (!) 119/93 117/81 108/76  Pulse: 91 95 97 95  Resp: _0 Temp: 99.3 F (37.4 C) 99.6 F (37.6 C) 98.5 F (36.9 C) 98.4 F (36.9 C)  TempSrc: Oral Oral Oral Oral  SpO2: 97% 99% 100% 100%  Weight:      Height:        Intake/Output Summary (Last 24 hours) at  02/25/2022 1232 Last data filed at 02/25/2022 0811 Gross per 24 hour  Intake 900 ml  Output 3620 ml  Net -2720 ml    Filed Weights   02/23/22 0500 03/10/2022 0500 03/11/2022 0744  Weight: 48.1 kg 53.1 kg 51.9 kg     Examination:  General exam: NAD. Respiratory system: Decreased breath sounds in the bases.  Right chest tube in place.  No wheezing.  Fair air movement.  Cardiovascular system: Regular rate rhythm no murmurs rubs or gallops.  No JVD.  No lower extremity edema.  Gastrointestinal system: Abdomen soft, nondistended, positive bowel sounds, some tenderness to palpation mid to lower abdominal region.  No rebound.  No guarding.  Central nervous system: Alert and oriented. No focal neurological deficits. Extremities: Symmetric 5 x 5 power. Skin: No rashes, lesions or ulcers Psychiatry: Judgement and insight appear normal. Mood & affect appropriate.     Data Reviewed: I have personally reviewed following labs and imaging studies  CBC: Recent Labs  Lab 02/21/22 0710 02/22/22 0135 02/23/22 0031 02/17/2022 0413 02/25/22 0056  WBC 89.8* 96.8* 104.7* 106.3* 92.1*  NEUTROABS 5.5 13.6* 5.2 5.4 7.0  HGB 11.8* 12.0 13.0 12.3 11.0*  HCT 37.3 38.7 40.4 39.5 36.3  MCV 91.2 91.5 91.0 91.6 93.3  PLT 269 339 403* 498* 474*     Basic Metabolic Panel: Recent Labs  Lab 02/19/22 0944 03/06/2022 0053 02/21/22 0710 02/22/22 0135 02/23/22 0031 03/09/2022 0413 02/25/22 0056  NA 135   < > 138 136 135 138 137  K 4.9   < > 5.2* 4.7 5.3* 4.8 5.1  CL 106   < > 103 102 100 101 102  CO2 25   < > _0 GLUCOSE 367*   < > 93 128* 233* 144* 148*  BUN 21   < > 22 25* _1 CREATININE 0.71   < > 0.55 0.59 0.50 0.41* 0.43*  CALCIUM 7.7*   < > 8.0* 7.8* 8.1* 8.0* 7.8*  MG 2.1  --  1.9  --   --   --  1.9   < > = values in this interval not displayed.     GFR: Estimated Creatinine Clearance: 60.5 mL/min (A) (by C-G formula based on SCr of 0.43 mg/dL (L)).  Liver Function Tests: Recent Labs  Lab 02/19/22 0944 02/21/22 0710  AST 24 27  ALT 18 20  ALKPHOS 77 77  BILITOT 0.3 0.1*  PROT 4.8* 4.5*  ALBUMIN 2.0* 1.8*     CBG: Recent Labs  Lab 03/04/2022 1135 02/19/2022 1558  03/02/2022 2053 02/25/22 0810 02/25/22 1207  GLUCAP 183* 176* 229* 134* 131*      Recent Results (from the past 240 hour(s))  Body fluid culture w Gram Stain     Status: None   Collection Time: 02/15/22  1:45 PM   Specimen: Pleura; Body Fluid  Result Value Ref Range Status   Specimen Description PLEURAL  Final   Special Requests NONE  Final   Gram Stain   Final    FEW WBC PRESENT, PREDOMINANTLY MONONUCLEAR NO ORGANISMS SEEN    Culture   Final    NO GROWTH 3 DAYS Performed at [facility], [address] [place], [place] [address]    Report Status 02/18/2022 FINAL  Final  Culture, body fluid w Gram Stain-bottle     Status: None   Collection Time: 02/15/22  4:30 PM   Specimen: Pleura  Result Value Ref Range  Status   Specimen Description PLEURAL FLUID RIGHT LUNG  Final   Special Requests NONE  Final   Culture   Final    NO GROWTH 5 DAYS Performed at [facility], [place] [address], [place], [place] [address]    Report Status 02/23/2022 FINAL  Final  Gram stain     Status: None   Collection Time: 02/15/22  4:30 PM   Specimen: Pleura  Result Value Ref Range Status   Specimen Description PLEURAL FLUID RIGHT LUNG  Final   Special Requests NONE  Final   Gram Stain   Final    NO SQUAMOUS EPITHELIAL CELLS SEEN FEW WBC SEEN NO ORGANISMS SEEN Performed at [facility], [place] [address] [place], [place] [address]    Report Status 02/15/2022 FINAL  Final  Body fluid culture w Gram Stain     Status: None   Collection Time: 02/26/2022  2:35 PM   Specimen: Pleural Fluid  Result Value Ref Range Status   Specimen Description PLEURAL  Final   Special Requests NONE  Final   Gram Stain   Final    FEW WBC PRESENT, PREDOMINANTLY PMN NO ORGANISMS SEEN    Culture   Final    NO GROWTH 3 DAYS Performed at [facility], [place] [address], [place], [place] [address]    Report Status 03/10/2022 FINAL  Final  Acid Fast Smear (AFB)     Status: None   Collection Time:  02/13/2022  2:35 PM   Specimen: Pleural, Left  Result Value Ref Range Status   AFB Specimen Processing Concentration  Final   Acid Fast Smear Negative  Final    Comment: (NOTE) Performed At: [facility] [address] [place], [place] [id number] Rush Farmer MD EX:[id number]    Source (AFB) PLEURAL  Final    Comment: Performed at [facility], [place] [address] [place], [place] [address]         Radiology Studies: [place] EKG SITE RITE  Result Date: 02/25/2022 If Site Rite image not attached, placement could not be confirmed due to current cardiac rhythm.  DG Chest Port 1 View  Result Date: 02/19/2022 CLINICAL DATA:  Chest tube placement EXAM: PORTABLE CHEST 1 VIEW COMPARISON:  02/22/2022 FINDINGS: A right pigtail pleural drainage catheter is in place. The pneumothorax component of the right-sided hydropneumothorax is no longer appreciated. Reduced right pleural fluid. There is continued blunting of the right lateral costophrenic angle likely reflecting some residual pleural effusion. There is also a large left pleural effusion encompassing well over half of the left hemithorax. Atherosclerotic calcification of the aortic arch. IMPRESSION: 1. Substantially reduced right pleural fluid, status post pleural pigtail drainage catheter placement. The pneumothorax component of the right hydropneumothorax is no longer seen. 2. Large left pleural effusion. 3.  Aortic Atherosclerosis (ICD10-I70.0). Electronically Signed   By: Van Clines M.D.   On: 03/10/2022 13:48        Scheduled Meds:  chlorhexidine gluconate (MEDLINE KIT)  15 mL Mouth Rinse BID   docusate sodium  100 mg Oral BID   enoxaparin (LOVENOX) injection  40 mg Subcutaneous Q24H   insulin aspart  0-9 Units Subcutaneous Q4H   insulin glargine-yfgn  8 Units Subcutaneous Daily   octreotide  50 mcg Subcutaneous Daily   polyethylene glycol  17 g Oral Daily   predniSONE  50 mg Oral Q breakfast   sodium chloride flush   10 mL Other Q8H   sodium chloride  flush  10-40 mL Intracatheter Q12H   Continuous Infusions:  sodium chloride 10 mL/hr at 02/17/22 0800     LOS: 11 days    Time spent: 35 minutes    Irine Seal, MD [facility]   To contact the attending provider between 7A-7P or the covering provider during after hours 7P-7A, please log into the web site [email or web address] and access using universal  [place] password for that web site. If you do not have the password, please call the hospital operator.  02/25/2022, 12:32 PM

## 2022-02-25 NOTE — Progress Notes (Signed)
Mobility Specialist Progress Note:   02/25/22 1625  Mobility  Activity  (bed level exercises)  Range of Motion/Exercises Active;All extremities  Level of Assistance Independent  Assistive Device None  Activity Response Tolerated well  $Mobility charge 1 Mobility   Pt declining all OOB mobility at this time. Pt states she cant get up per MD, although no bed rest order, RN confirmed. Pt encouraged OOB mobility to prevent hospital debility. Pt agrees, however says "not today". Pt performed multiple bed level exercises. Left with all needs met.  Nelta Numbers Acute Rehab Secure Chat or Office Phone: [phone]

## 2022-02-25 NOTE — Progress Notes (Addendum)
Nutrition Follow-up  DOCUMENTATION CODES:   Severe malnutrition in context of chronic illness, Underweight  INTERVENTION:   - TPN management per Pharmacy  Monitor magnesium, potassium, and phosphorus BID for at least 3 days, MD to replete as needed, as pt is at risk for refeeding syndrome given severe malnutrition.  - Checking vitamin A and zinc labs  NUTRITION DIAGNOSIS:   Severe Malnutrition related to chronic illness (CLL) as evidenced by severe muscle depletion, severe fat depletion.  Ongoing, being addressed via initiation of TPN  GOAL:   Patient will meet greater than or equal to 90% of their needs  Progressing with initiation of TPN  MONITOR:   Labs, Weight trends, Skin, I & O's, Diet advancement  REASON FOR ASSESSMENT:   Consult Assessment of nutrition requirement/status  ASSESSMENT:   61 yo female admitted with AMS and unresponsiveness. PMH includes CLL, depression, anxiety, HLD, HTN, lymphadenopathy, iron deficiency.  06/02 - admitted unresponsive, intubated 06/03 - s/p right chest tube placement, s/p L thoracentesis with 2 L removed 06/04 - extubated 06/08 - s/p thoracentesis with 1.4 L fluid removed 06/10 - chest tube removed 06/12 - s/p right chest tube placement with 3.1 L drained  Pt with bilateral chylothorax and right-sided chest tube in place. Noted pt with 3420 ml output from chest tube since it was placed. PCCM recommending left-sided chest tube but pt would like to hold off.  Per notes, pt has opted to pursue treatment for CLL/SLL (refused treatment in the past) and Oncology is following.  Discussed pt with PCCM and MD. Recommend initiation of TPN given severity of malnutrition as well as high chylous output from R chest tube. Discussed with Pharmacy. TPN to start today at 1800 at 50 ml/hr with goal rate being 100 ml/hr. TPN at goal rate will provide 2300 kcal and 150 grams of protein. Pt will be made NPO.  Discussed the above with pt who  seemed somewhat confused about current situation. Pt with lunch meal tray in front of her. She is unhappy with the fact that she won't be able to eat but agreeable to plan if it means she will heal and be able to discharge.  Admit weight: 64 kg (question accuracy) Current weight: 51.9 kg  Meal Completion: 65-100% of low fat diet (< 20 grams of total fat per day)  Medications reviewed and include: colace, SSI q 4 hours, semglee 8 units daily, octreotide 50 mcg TID, miralax, prednisone  Labs reviewed: WBC 92.1, TG in pleural fluid 365 on 6/08, cholesterol in pleural fluid 92 on 6/03 (repeat value pending)  I/O's: -7.1 L since admit  Diet Order:   Diet Order             Diet NPO time specified  Diet effective now                   EDUCATION NEEDS:   Education needs have been addressed  Skin:  Skin Assessment: Skin Integrity Issues: Stage I: sacrum  Last BM:  02/13/2022  Height:   Ht Readings from Last 1 Encounters:  02/16/2022 '5\' 5"'$  (1.651 m)    Weight:   Wt Readings from Last 1 Encounters:  02/21/2022 51.9 kg    Ideal Body Weight:  56.8 kg  BMI:  Body mass index is 19.04 kg/m.  Estimated Nutritional Needs:   Kcal:  2505-3976  Protein:  140-160 grams  Fluid:  >2.5 L    Gustavus Bryant, MS, RD, LDN Inpatient Clinical Dietitian Please see  AMiON for contact information.

## 2022-02-25 NOTE — Progress Notes (Signed)
Peripherally Inserted Central Catheter Placement  The IV Nurse has discussed with the patient and/or persons authorized to consent for the patient, the purpose of this procedure and the potential benefits and risks involved with this procedure.  The benefits include less needle sticks, lab draws from the catheter, and the patient may be discharged home with the catheter. Risks include, but not limited to, infection, bleeding, blood clot (thrombus formation), and puncture of an artery; nerve damage and irregular heartbeat and possibility to perform a PICC exchange if needed/ordered by physician.  Alternatives to this procedure were also discussed.  Bard Power PICC patient education guide, fact sheet on infection prevention and patient information card has been provided to patient /or left at bedside.    PICC Placement Documentation  PICC Double Lumen 53/66/44 Right Basilic 37 cm 0 cm (Active)  Indication for Insertion or Continuance of Line Administration of hyperosmolar/irritating solutions (i.e. TPN, Vancomycin, etc.) 02/25/22 1536  Exposed Catheter (cm) 0 cm 02/25/22 1536  Site Assessment Clean, Dry, Intact 02/25/22 1536  Lumen #1 Status Flushed;Saline locked;Blood return noted 02/25/22 1536  Lumen #2 Status Flushed;Saline locked;Blood return noted 02/25/22 1536  Dressing Type Transparent;Securing device 02/25/22 1536  Dressing Status Antimicrobial disc in place;Clean, Dry, Intact 02/25/22 1536  Safety Lock Not Applicable 03/47/42 5956  Line Adjustment (NICU/IV Team Only) No 02/25/22 1536  Dressing Intervention Other (Comment) 02/25/22 1536  Dressing Change Due 03/04/22 02/25/22 [place], [place] 02/25/2022, 3:38 PM

## 2022-02-26 ENCOUNTER — Inpatient Hospital Stay (HOSPITAL_COMMUNITY): Payer: Medicaid Other

## 2022-02-26 ENCOUNTER — Encounter (HOSPITAL_COMMUNITY): Payer: Self-pay | Admitting: Internal Medicine

## 2022-02-26 DIAGNOSIS — A419 Sepsis, unspecified organism: Secondary | ICD-10-CM | POA: Diagnosis not present

## 2022-02-26 DIAGNOSIS — J940 Chylous effusion: Secondary | ICD-10-CM | POA: Diagnosis not present

## 2022-02-26 DIAGNOSIS — I5032 Chronic diastolic (congestive) heart failure: Secondary | ICD-10-CM | POA: Diagnosis not present

## 2022-02-26 DIAGNOSIS — J90 Pleural effusion, not elsewhere classified: Secondary | ICD-10-CM | POA: Diagnosis not present

## 2022-02-26 DIAGNOSIS — C9110 Chronic lymphocytic leukemia of B-cell type not having achieved remission: Secondary | ICD-10-CM | POA: Diagnosis not present

## 2022-02-26 DIAGNOSIS — J9601 Acute respiratory failure with hypoxia: Secondary | ICD-10-CM | POA: Diagnosis not present

## 2022-02-26 LAB — GLUCOSE, CAPILLARY
Glucose-Capillary: 131 mg/dL — ABNORMAL HIGH (ref 70–99)
Glucose-Capillary: 137 mg/dL — ABNORMAL HIGH (ref 70–99)
Glucose-Capillary: 270 mg/dL — ABNORMAL HIGH (ref 70–99)
Glucose-Capillary: 270 mg/dL — ABNORMAL HIGH (ref 70–99)
Glucose-Capillary: 280 mg/dL — ABNORMAL HIGH (ref 70–99)
Glucose-Capillary: 64 mg/dL — ABNORMAL LOW (ref 70–99)
Glucose-Capillary: 78 mg/dL (ref 70–99)
Glucose-Capillary: 83 mg/dL (ref 70–99)

## 2022-02-26 LAB — CBC WITH DIFFERENTIAL/PLATELET
Abs Immature Granulocytes: 0.2 10*3/uL — ABNORMAL HIGH (ref 0.00–0.07)
Basophils Absolute: 0.2 10*3/uL — ABNORMAL HIGH (ref 0.0–0.1)
Basophils Relative: 0 %
Eosinophils Absolute: 0 10*3/uL (ref 0.0–0.5)
Eosinophils Relative: 0 %
HCT: 35.8 % — ABNORMAL LOW (ref 36.0–46.0)
Hemoglobin: 11.2 g/dL — ABNORMAL LOW (ref 12.0–15.0)
Immature Granulocytes: 0 %
Lymphocytes Relative: 93 %
Lymphs Abs: 79.8 10*3/uL — ABNORMAL HIGH (ref 0.7–4.0)
MCH: 29.3 pg (ref 26.0–34.0)
MCHC: 31.3 g/dL (ref 30.0–36.0)
MCV: 93.7 fL (ref 80.0–100.0)
Monocytes Absolute: 0.4 10*3/uL (ref 0.1–1.0)
Monocytes Relative: 0 %
Neutro Abs: 6.1 10*3/uL (ref 1.7–7.7)
Neutrophils Relative %: 7 %
Platelets: 508 10*3/uL — ABNORMAL HIGH (ref 150–400)
RBC: 3.82 MIL/uL — ABNORMAL LOW (ref 3.87–5.11)
RDW: 18 % — ABNORMAL HIGH (ref 11.5–15.5)
WBC: 86.8 10*3/uL (ref 4.0–10.5)
nRBC: 0 % (ref 0.0–0.2)

## 2022-02-26 LAB — COMPREHENSIVE METABOLIC PANEL
ALT: 39 U/L (ref 0–44)
AST: 36 U/L (ref 15–41)
Albumin: 2 g/dL — ABNORMAL LOW (ref 3.5–5.0)
Alkaline Phosphatase: 69 U/L (ref 38–126)
Anion gap: 4 — ABNORMAL LOW (ref 5–15)
BUN: 21 mg/dL (ref 8–23)
CO2: 33 mmol/L — ABNORMAL HIGH (ref 22–32)
Calcium: 8.1 mg/dL — ABNORMAL LOW (ref 8.9–10.3)
Chloride: 101 mmol/L (ref 98–111)
Creatinine, Ser: 0.45 mg/dL (ref 0.44–1.00)
GFR, Estimated: >60 mL/min (ref >60)
Glucose, Bld: 128 mg/dL — ABNORMAL HIGH (ref 70–99)
Potassium: 4.7 mmol/L (ref 3.5–5.1)
Sodium: 138 mmol/L (ref 135–145)
Total Bilirubin: 0.3 mg/dL (ref 0.3–1.2)
Total Protein: 4.6 g/dL — ABNORMAL LOW (ref 6.5–8.1)

## 2022-02-26 LAB — CHOLESTEROL, BODY FLUID: Cholesterol, Fluid: 45 mg/dL

## 2022-02-26 LAB — ZAP-70 PANEL (ASR)

## 2022-02-26 LAB — TRIGLYCERIDES: Triglycerides: 90 mg/dL (ref <150)

## 2022-02-26 LAB — MAGNESIUM: Magnesium: 2 mg/dL (ref 1.7–2.4)

## 2022-02-26 LAB — PHOSPHORUS: Phosphorus: 3.2 mg/dL (ref 2.5–4.6)

## 2022-02-26 MED ORDER — TRACE MINERALS CU-MN-SE-ZN 300-55-60-3000 MCG/ML IV SOLN
INTRAVENOUS | Status: DC
  Filled 2022-02-26: qty 1000

## 2022-02-26 MED ORDER — DEXTROSE 10 % IV SOLN
INTRAVENOUS | Status: AC
  Filled 2022-02-26: qty 1000

## 2022-02-26 NOTE — Progress Notes (Signed)
Brief Pharmacy Note re: TPN  Patient transferred this evening from [facility] to [facility].  Apparently, [facility] cannot transfer patient on TPN therefore TPN was taken down prior to transfer.   Infuse D10W @ 151m/hr until new TPN bag can be prepared and hung (~6p on 6/15).  MNetta Cedars PharmD, BCPS 02/27/2022'@12'$ :21 AM

## 2022-02-26 NOTE — Progress Notes (Signed)
PROGRESS NOTE    [name]  EXN:[id number] DOB: 04/08/1961 DOA: 02/13/2022 PCP: Noreene Larsson, NP    Chief Complaint  Patient presents with   unresponsive    Brief Narrative:  Patient is a 61 year old female with pertinent PMH of CLL, depression, anxiety, HLD, HTN presents to [facility] ED on 6/2 unresponsive.  On 6/2 patient called EMS for abdominal pain.  Patient initially was alert and talking but became unresponsive with AMS.  Patient was hypoxic with agonal breathing and was placed on NRB.  Patient was noted to be hypotensive.  Patient transported to [facility] ED.  Upon arrival to [facility] ED patient remained unresponsive with agonal respirations.  Patient intubated for airway protection and hypoxia.  BP initially 81/65.  Given IV fluids.  Started on levo.  Fever 100.9 F and WBC 104.4.  Started on cefepime, Flagyl, Vanco.  Cultures pending.  Glucose 198, BNP 534, troponin 109 then 112, LA 4.4 then 1.8, UA unremarkable, UDS negative.  CXR with large left pleural effusion and small right pleural effusion.  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  CT abdomen with ascending colitis constipation, small intestine wall thickening possibly related to gastroenteritis; small volume ascites; extensive abdominopelvic and inguinal adenopathy.  PCCM consulted for ICU admission and transfer to [facility].   Assessment & Plan:   Principal Problem:   Sepsis ([place]) Active Problems:   Acute respiratory failure with hypoxia (HCC)   CLL (chronic lymphocytic leukemia) (HCC)   Shock (HCC)   Bilateral pleural effusion   Alteration in electrolyte and fluid balance   Chronic diastolic heart failure (HCC)   DNR (do not resuscitate)   Protein-calorie malnutrition, severe   Pressure injury of skin   Hyperkalemia  1 acute respiratory failure with hypoxia/bilateral chylothorax -Chylothorax likely related to significant  adenopathy/CLL. -Patient noted to have bulky adenopathy in the mediastinum status post axillary lymph node biopsy. -Patient started on pulse dose steroids on 1 mg/kg-50 mg of prednisone started. -Status post tube placement on the right being managed per PCCM. -Chest tube noted to have migrated out of the chest and as such removed, -Per PCCM patient may need CT-guided drainage. -Patient started on octreotide per PCCM and attempt to slow chyle production as repeated bilateral trace drainage not obtainable long-term solution. -PCCM having dietitian to see how low-fat diet will also help reduce chyle flow. -Dietitian recommending starting on TNA and as such PICC line ordered. -TNA started -Chest x-ray with worsening left-sided pleural effusion and patient underwent ultrasound-guided thoracentesis by PCCM on 02/16/2022 with 1400 cc of fluid removed with studies pending.  -Per PCCM patient may need to start treatment for CLL inpatient. -CT chest 02/22/2022 with recurrence of bilateral chylothorax and as such PCCM placed right-sided chest tube 03/05/2022. -Patient on octreotide and dose increased to 50 mcg 3 times daily in attempt to slow chyle production per pulmonary.. -Per PCCM note is noted that patient would like to have treatment for CLL/SLL and oncology informed. -Per PCCM.  2.  CLL/SLL -Status post axillary lymph node biopsy with biopsy results consistent with CLL/SLL.. -Patient started on pulse dose steroids. -Patient with a worsening leukocytosis. -Patient with recurrent chylothorax with 190 cc out of chest tube over the past 24 hours. -Per PCCM note patient would like to have treatment for CLL/SLL which she had refused in the past. -Per oncology.  3.  Chronic diastolic CHF  grade 2/hypertension/hyperlipidemia -ACE inhibitor and diuretic currently on hold. -BP stable. -No signs of overt volume overload on examination. -Follow.  4.  Leukocytosis -Likely multifactorial secondary to CLL,  pulse dose steroids. -Leukocytosis worsening in the setting of CLL and pulse dose steroids.. -Oncology following and arranging outpatient follow-up and further work-up in the outpatient setting. -May need inpatient treatment due to chylothorax. -Per oncology. -Follow.  5.  Shock/hypotension/?Sepsis -Resolved.   -Questionable etiology patient treated as a sepsis with no obvious source.   -Status post 5 days antibiotics. -No further antibiotics needed  6.  Hyperglycemia -Likely secondary to steroids. -Hemoglobin A1c 6.5. -CBG 83 this morning. -Continue Semglee to 8 units daily.   -SSI.  -Diabetes coordinator following.  7.  Hyperkalemia -Status post Lokelma 10 mg twice daily x1 day.   -Potassium of 4.7  8.  Severe malnutrition -Patient seen by dietitian, PICC line placed. -Patient started on TPN. -We will place patient on a low-fat diet in addition to TPN.  9.  Pressure injury, POA Pressure Injury 02/17/22 Sacrum Medial Stage 1 -  Intact skin with non-blanchable redness of a localized area usually over a bony prominence. (Active)  02/17/22 0745  Location: Sacrum  Location Orientation: Medial  Staging: Stage 1 -  Intact skin with non-blanchable redness of a localized area usually over a bony prominence.  Wound Description (Comments):   Present on Admission: No       DVT prophylaxis: Lovenox Code Status: DNR Family Communication: Updated patient.  No family at bedside. Disposition: TBD  Status is: Inpatient Remains inpatient appropriate because: Severity of illness.   Consultants:  Interventional radiology: Dr. Vernard Gambles 02/17/2022 General surgery: Dr. Dema Severin 02/17/2022 Oncology: Dr. Lorenso Courier 02/16/2022    [place] [facility] Events: Including procedures, antibiotic start and stop dates in addition to other pertinent events   6/2: Patient admitted to [facility] ED unresponsive; intubated; shock started on levo; transferred to [facility]. CT chest and abd: 1. Extensive abdominopelvic  and inguinal adenopathy, most likely related to lymphoma/leukemia. Numerous small hypodense splenic lesions versus bolus phase phenomenon. Intrahepatic periportal edema which is probably either due to fluid overload or hepatic dysfunction. The portal vein is patent within normal caliber limits. Chronic calcific pancreatitis but no findings of acute pancreatitis or ductal dilatation.  Ascending colitis versus nondistention, remainder of the colon with constipation. Diffuse small intestinal wall thickening with gastric fold thickening, could relate to gastroenteritis or hepatic dysfunction versus reactive from ascites versus congestive or infiltrating disease. 6/3 right chest tube placed. Left thora 2000 ml removed.  6/4 extubated. ollowing commands. Lots of milky pleural fluid removed yesterday - exudative, cx negative, lymphocytic. 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 Off pressors 6/6 Transferred to [facility]       Procedures:  CT head 03/04/2022 CT angiogram chest 02/15/2022 CT abdomen and pelvis 03/09/2022 Chest x-ray 03/04/2022, 02/15/2022, 03/13/2022, 02/17/2022, 02/19/2022 2D echo 02/15/2022 Right axillary core lymph node biopsy 02/18/2022 Ultrasound-guided thoracentesis 02/23/2022--1400 cc of fluid removed Chest tube placement per PCCM, Dr. Tacy Learn 02/28/2022 CT chest 02/22/2022 PICC line placement 02/25/2022  Antimicrobials:  IV cefepime 03/03/2022>>>> 02/18/2022 IV Flagyl 03/10/2022>>>> 02/18/2022 IV vancomycin 02/13/2022>>> 02/15/2022   Subjective: Patient sitting up in bed asking why her diet was discontinued.  States some improvement with shortness of breath after chest tube was placed.  Denies any chest pain.    Objective: Vitals:   02/25/22 1602 02/25/22 2005 02/26/22 0454 02/26/22 0747  BP: 110/71 110/76 (!) 115/94 113/81  Pulse: 94 87 83 76  Resp: _0 Temp: 99.2 F (37.3 C) 98.8 F (37.1 C) 98.2 F (36.8 C) 98.2 F (36.8 C)  TempSrc: Oral Oral Oral Oral   SpO2: 96% 96% 100% 96%  Weight:      Height:        Intake/Output Summary (Last 24 hours) at 02/26/2022 0940 Last data filed at 02/26/2022 0600 Gross per 24 hour  Intake 1039.94 ml  Output 790 ml  Net 249.94 ml    Filed Weights   02/23/22 0500 03/08/2022 0500 02/16/2022 0744  Weight: 48.1 kg 53.1 kg 51.9 kg    Examination:  General exam: NAD.  Frail.  Cachectic. Respiratory system: Decreased breath sounds in the left base.  Right chest tube in place.  No wheezing.  Fair air movement.  Cardiovascular system: RRR no murmurs rubs or gallops.  No JVD.  No lower extremity edema.  Gastrointestinal system: Abdomen soft, nondistended, positive bowel sounds, some tenderness to palpation mid to lower abdominal region.  No rebound.  No guarding.  Central nervous system: Alert and oriented. No focal neurological deficits. Extremities: Symmetric 5 x 5 power. Skin: No rashes, lesions or ulcers Psychiatry: Judgement and insight appear normal. Mood & affect appropriate.     Data Reviewed: I have personally reviewed following labs and imaging studies  CBC: Recent Labs  Lab 02/21/22 0710 02/22/22 0135 02/23/22 0031 03/09/2022 0413 02/25/22 0056  WBC 89.8* 96.8* 104.7* 106.3* 92.1*  NEUTROABS 5.5 13.6* 5.2 5.4 7.0  HGB 11.8* 12.0 13.0 12.3 11.0*  HCT 37.3 38.7 40.4 39.5 36.3  MCV 91.2 91.5 91.0 91.6 93.3  PLT 269 339 403* 498* 474*     Basic Metabolic Panel: Recent Labs  Lab 02/19/22 0944 02/28/2022 0053 02/21/22 0710 02/22/22 0135 02/23/22 0031 03/01/2022 0413 02/25/22 0056 02/26/22 0341  NA 135   < > 138 136 135 138 137 138  K 4.9   < > 5.2* 4.7 5.3* 4.8 5.1 4.7  CL 106   < > 103 102 100 101 102 101  CO2 25   < > _1 33*  GLUCOSE 367*   < > 93 128* 233* 144* 148* 128*  BUN 21   < > 22 25* _2 CREATININE 0.71   < > 0.55 0.59 0.50 0.41* 0.43* 0.45  CALCIUM 7.7*   < > 8.0* 7.8* 8.1* 8.0* 7.8* 8.1*  MG 2.1  --  1.9  --   --   --  1.9 2.0  PHOS  --   --   --    --   --   --   --  3.2   < > = values in this interval not displayed.     GFR: Estimated Creatinine Clearance: 60.5 mL/min (by C-G formula based on SCr of 0.45 mg/dL).  Liver Function Tests: Recent Labs  Lab 02/19/22 0944 02/21/22 0710 02/26/22 0341  AST 24 27 36  ALT 18 20 39  ALKPHOS 77 77 69  BILITOT 0.3 0.1* 0.3  PROT 4.8* 4.5* 4.6*  ALBUMIN 2.0* 1.8* 2.0*     CBG: Recent Labs  Lab 02/25/22 1604 02/25/22 2006 02/26/22 0018 02/26/22 0455 02/26/22 0749  GLUCAP 230* 116* 131* 137* 83      Recent Results (from the past 240 hour(s))  Body fluid culture w Gram Stain     Status: None   Collection Time: 02/18/2022  2:35 PM   Specimen: Pleural Fluid  Result Value Ref Range  Status   Specimen Description PLEURAL  Final   Special Requests NONE  Final   Gram Stain   Final    FEW WBC PRESENT, PREDOMINANTLY PMN NO ORGANISMS SEEN    Culture   Final    NO GROWTH 3 DAYS Performed at [facility], [address], [place], [place] [address]    Report Status 02/21/2022 FINAL  Final  Fungus Culture With Stain     Status: None (Preliminary result)   Collection Time: 03/05/2022  2:35 PM   Specimen: Pleural Fluid  Result Value Ref Range Status   Fungus Stain Final report  Final    Comment: (NOTE) Performed At: [facility] [place], [place] [id number] Rush Farmer MD JQ:[id number]    Fungus (Mycology) Culture PENDING  Incomplete   Fungal Source PLEURAL  Final    Comment: Performed at [facility], [place] [address], [place], [place] [address]  Acid Fast Smear (AFB)     Status: None   Collection Time: 02/16/2022  2:35 PM   Specimen: Pleural, Left  Result Value Ref Range Status   AFB Specimen Processing Concentration  Final   Acid Fast Smear Negative  Final    Comment: (NOTE) Performed At: [facility] [place], [place] [id number] Rush Farmer MD EB:[id number]    Source (AFB) PLEURAL  Final    Comment: Performed  at [facility], [place] [address], [place], [place] [address]  Fungus Culture Result     Status: None   Collection Time: 02/23/2022  2:35 PM  Result Value Ref Range Status   Result 1 Comment  Final    Comment: (NOTE) KOH/Calcofluor preparation:  no fungus observed. Performed At: [facility] [place], [place] [id number] Rush Farmer MD FY:[id number]          Radiology Studies: [facility] Chest Port 1 View  Result Date: 02/26/2022 CLINICAL DATA:  Pleural effusions. EXAM: PORTABLE CHEST 1 VIEW COMPARISON:  Chest x-ray 03/04/2022. FINDINGS: The right chest tube has changed position. It is now likely in the posterior inferior right pleural space but difficult to be certain without a lateral film. Small residual right pleural effusion and right basilar atelectasis. No pneumothorax. Persistent large left pleural effusion with overlying atelectasis. New right-sided PICC line is noted.  The tip is in the right atrium. IMPRESSION: 1. The right chest tube has changed position and is now likely in the posterior inferior right pleural space but difficult to be certain without a lateral film. 2. Persistent large left pleural effusion with overlying atelectasis. 3. New right PICC line with tip in the right atrium. Electronically Signed   By: Marijo Sanes M.D.   On: 02/26/2022 07:58   [place] EKG SITE RITE  Result Date: 02/25/2022 If Site Rite image not attached, placement could not be confirmed due to current cardiac rhythm.  DG Chest Port 1 View  Result Date: 03/03/2022 CLINICAL DATA:  Chest tube placement EXAM: PORTABLE CHEST 1 VIEW COMPARISON:  02/22/2022 FINDINGS: A right pigtail pleural drainage catheter is in place. The pneumothorax component of the right-sided hydropneumothorax is no longer appreciated. Reduced right pleural fluid. There is continued blunting of the right lateral costophrenic angle likely reflecting some residual pleural effusion. There is also a large left pleural  effusion encompassing well over half of the left hemithorax. Atherosclerotic calcification of the aortic arch. IMPRESSION: 1. Substantially reduced right pleural fluid, status post pleural pigtail drainage catheter placement. The pneumothorax  component of the right hydropneumothorax is no longer seen. 2. Large left pleural effusion. 3.  Aortic Atherosclerosis (ICD10-I70.0). Electronically Signed   By: Van Clines M.D.   On: 02/21/2022 13:48        Scheduled Meds:  chlorhexidine gluconate (MEDLINE KIT)  15 mL Mouth Rinse BID   Chlorhexidine Gluconate Cloth  6 each Topical Daily   docusate sodium  100 mg Oral BID   enoxaparin (LOVENOX) injection  40 mg Subcutaneous Q24H   insulin aspart  0-9 Units Subcutaneous Q4H   insulin glargine-yfgn  8 Units Subcutaneous Daily   octreotide  50 mcg Subcutaneous TID   polyethylene glycol  17 g Oral Daily   predniSONE  50 mg Oral Q breakfast   sodium chloride flush  10 mL Other Q8H   sodium chloride flush  10-40 mL Intracatheter Q12H   sodium chloride flush  10-40 mL Intracatheter Q12H   Continuous Infusions:  sodium chloride 10 mL/hr at 02/17/22 0800   TPN ADULT (ION) 50 mL/hr at 02/25/22 1804   TPN ADULT (ION)       LOS: 12 days    Time spent: 35 minutes    Irine Seal, MD [facility]   To contact the attending provider between 7A-7P or the covering provider during after hours 7P-7A, please log into the web site [email or web address] and access using universal [place] password for that web site. If you do not have the password, please call the hospital operator.  02/26/2022, 9:40 AM

## 2022-02-26 NOTE — Progress Notes (Signed)
Patient just taken by [facility], pleurevac changed prior to transfer, updated Carmin Richmond via secured chat about transport.

## 2022-02-26 NOTE — Progress Notes (Signed)
NAME:  [name], MRN:  [id number], DOB:  03/22/1961, LOS: 32 ADMISSION DATE:  03/06/2022, CONSULTATION DATE:  6/2 REFERRING MD:  Dr. Alvino Chapel, CHIEF COMPLAINT:  Unresponsive; Acute resp failure   History of Present Illness:  Patient is a 61 year old female with pertinent PMH of CLL, depression, anxiety, HLD, HTN presents to [facility] ED on 6/2 unresponsive.  On 6/2 patient called EMS for abdominal pain.  Patient initially was alert and talking but became unresponsive with AMS.  Patient was hypoxic with agonal breathing and was placed on NRB.  Patient was noted to be hypotensive.  Patient transported to [facility] ED.  Upon arrival to [facility] ED patient remained unresponsive with agonal respirations.  Patient intubated for airway protection and hypoxia.  BP initially 81/65.  Given IV fluids.  Started on levo.  Fever 100.9 F and WBC 104.4.  Started on cefepime, Flagyl, Vanco.  Cultures pending.  Glucose 198, BNP 534, troponin 109 then 112, LA 4.4 then 1.8, UA unremarkable, UDS negative.  CXR with large left pleural effusion and small right pleural effusion.  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  CT abdomen with ascending colitis constipation, small intestine wall thickening possibly related to gastroenteritis; small volume ascites; extensive abdominopelvic and inguinal adenopathy.   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 pylori infection 2/09   s/p prevpac   HAND PAIN, BILATERAL 01/29/2009   Qualifier: Diagnosis of  By: Moshe Cipro MD, Margaret     Hypertension    Iron deficiency 11/17/2016    Lymphadenopathy 01/17/2016   Medical non-compliance 08/22/2017   Nicotine addiction    Psychosis ([place])    social worker states that patient does not like to talk about this and will stop her meds if you try and discuss this with her.   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 CT chest and abd: 1. Extensive abdominopelvic and inguinal adenopathy, most likely related to lymphoma/leukemia. Numerous small hypodense splenic lesions versus bolus phase phenomenon. Intrahepatic periportal edema which is probably either due to fluid overload or hepatic dysfunction. The portal vein is patent within normal caliber limits. Chronic calcific pancreatitis but no findings of acute pancreatitis or ductal dilatation.  Ascending colitis versus nondistention, remainder of the colon with constipation. Diffuse small intestinal wall thickening with gastric fold thickening, could relate to gastroenteritis or hepatic dysfunction versus reactive from ascites versus congestive or infiltrating disease. 6/3 right chest tube placed. Left thora 2000 ml removed - milky 6/4 extubated -exudative, cx negative, lymphocytic. 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 Off pressors 6/6 Transferred to [facility] 6/6 ultrasound-guided right axillary lymph node biopsy -Started on pulse dose steroids at 50 mg prednisone 6/7 chest tube output over a liter 6/8 left thoracentesis for 1400 cc of fluid  Interim History / Subjective:   No chest pain. Saturation 96% on  2 L Chest tube drained 190 cc of milky fluid   Objective   Blood pressure 113/81, pulse 76, temperature 98.2 F (36.8 C), temperature source Oral, resp. rate 17, height '5\' 5"'$  (1.651 m), weight 51.9 kg, last menstrual period  07/25/2016, SpO2 96 %.        Intake/Output Summary (Last 24 hours) at 02/26/2022 1014 Last data filed at 02/26/2022 0600 Gross per 24 hour  Intake 1039.94 ml  Output 790 ml  Net 249.94 ml    Filed Weights   02/23/22 0500 02/26/2022 0500 03/12/2022 0744  Weight: 48.1 kg 53.1 kg 51.9 kg   Physical Exam: General: Elderly, frail, cachectic female, sitting up in bed, no distress HENT: Moist oral mucosa, Bulky, firm supraclavicular lymphadenopathy.  Eyes: Mild pallor, no icterus Respiratory: No accessory muscle use, decreased breath sounds bilateral, right chest tube has milky fluid, no air leak Cardiovascular: S1-S2 appreciated Abdomen soft and nontender Extremities: No extremity tenderness, venous stasis rash on both legs.  Neuro: alert and oriented with no focal deficits.    Labs show normal electrolytes, stable anemia  Chest x-ray 6/14 independently reviewed shows large left effusion, minimal right effusion, pleural catheter in good position  Assessment & Plan:  Bilateral chylothorax Acute hypoxic respiratory failure due to above  Left effusion is worsening, right chest tube continues to drain -she again refuses left-sided chest tube "I pray that my breathing will not get worse" Increase octreotide to 50 mcg 3 times daily  in attempt to slow chyle production   Protein calorie malnutrition, moderate to severe -On TNA via PICC line -Unclear why she was placed n.p.o., can resume diet, would prefer low-fat  CLL/SLL -Per oncology, on prednisone Rituximab being considered  Chronic HFpEF due to uncontrolled hypertension Rest of the management per primary team    Kara Mead MD. [place]. [place] Pager : [phone]  If no response to pager , please call [phone] until 7 pm After 7:00 pm call [place]  [phone]   02/26/2022

## 2022-02-26 NOTE — Progress Notes (Incomplete)
Brief Pharmacy Note re: TPN  Patient transferred this evening from [facility] to [facility].  Apparently, [facility] cannot transfer patient on TPN.

## 2022-02-26 NOTE — Care Plan (Signed)
Dr. Lorenso Courier, oncology requesting patient be transferred to [facility] long hospital for initiation of radiation treatment to help reduce lymphadenopathy and improve lymph flow.  Patient being transferred to [facility].  Pulmonary to follow to help manage chest tubes.  No charge.

## 2022-02-26 NOTE — TOC Progression Note (Signed)
Transition of Care [facility]) - Progression Note    Patient Details  Name: [name] MRN: [id number] Date of Birth: 1961-07-14  Transition of Care [facility]) CM/SW [place], [place] Phone Number: 02/26/2022, 12:24 PM  Clinical Narrative:    CSW spoke with pt's son and DIL and they noted several facilities they would be interested in for LTC placement. Others were also identified and all questions answered. TOC will continue to follow for DC needs.   Expected Discharge Plan: Long Term Nursing Home Barriers to Discharge: Continued Medical Work up, Ship broker, Other (must enter comment) (LTC placement)  Expected Discharge Plan and Services Expected Discharge Plan: [place]     Post Acute Care Choice: Nursing Home Living arrangements for the past 2 months: Single Family Home                                       Social Determinants of Health (SDOH) Interventions    Readmission Risk Interventions     No data to display

## 2022-02-26 NOTE — Progress Notes (Signed)
Hematology/Oncology Progress Note  Clinical Summary: [name] is a 61 year old female with medical history significant for CLL previously followed by Dr. 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  Interval History: --continued heavy production of pleural fluid despite octreotide and right chest tube placement. 3420 ml output on 03/10/2022, only 200 noted on 02/25/2022.  --patient declines left sided chest tube, worsening left sided effusion --today labs show WBC 86.8, Hgb 11.2, Plt 508. --patient reports she is feeling better, with improved breathing --discussed with Rad/Onc and pulmonary. Radiation therapy could be performed if patient is transferred to [facility].  --vitals stable, on 2 L nasal cannula O2.    O:  Vitals:   02/26/22 0747 02/26/22 1546  BP: 113/81 108/84  Pulse: 76 86  Resp: 17 18  Temp: 98.2 F (36.8 C) 97.8 F (36.6 C)  SpO2: 96% 98%      Latest Ref Rng & Units 02/26/2022    3:41 AM 02/25/2022   12:56 AM 03/03/2022    4:13 AM  CMP  Glucose 70 - 99 mg/dL 128  148  144   BUN 8 - 23 mg/dL '21  20  15   '$ Creatinine 0.44 - 1.00 mg/dL 0.45  0.43  0.41   Sodium 135 - 145 mmol/L 138  137  138   Potassium 3.5 - 5.1 mmol/L 4.7  5.1  4.8   Chloride 98 - 111 mmol/L 101  102  101   CO2 22 - 32 mmol/L 33  30  30   Calcium 8.9 - 10.3 mg/dL 8.1  7.8  8.0   Total Protein 6.5 - 8.1 g/dL 4.6     Total Bilirubin 0.3 - 1.2 mg/dL 0.3     Alkaline Phos 38 - 126 U/L 69     AST 15 - 41 U/L 36     ALT 0 - 44 U/L 39         Latest Ref Rng & Units 02/26/2022    9:50 AM 02/25/2022   12:56 AM 02/27/2022    4:13 AM  CBC  WBC 4.0 - 10.5 K/uL 86.8  92.1  106.3   Hemoglobin 12.0 - 15.0 g/dL 11.2  11.0  12.3   Hematocrit 36.0 - 46.0 % 35.8  36.3  39.5   Platelets 150 - 400 K/uL 508  474  498       GENERAL: critically ill appearing middle aged [place] American female in NAD  SKIN: skin color, texture, turgor are normal, no  rashes or significant lesions EYES: conjunctiva are pink and non-injected, sclera clear OROPHARYNX: no exudate, no erythema; lips, buccal mucosa, and tongue normal  NECK: supple, non-tender LYMPH:  palpable lymphadenopathy in the cervical and axillary regions.  LUNGS: clear to auscultation and percussion with normal breathing effort HEART: regular rate & rhythm and no murmurs and no lower extremity edema Musculoskeletal: no cyanosis of digits and no clubbing  PSYCH: alert & oriented x 3, fluent speech NEURO: no focal motor/sensory deficits  Assessment/Plan:  [name] is a 61 year old female with medical history significant for CLL previously followed by Dr. 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  # Diffuse Lymphomadenopathy/Prior Diagnosis of CLL -- There is been marked enlargement of the lymph nodes in the interim since the patient's last visit in 2021.   --excisional biopsy of lymph node showed CLL with no evidence of transformation.   --Remarkably patient does not appear to  have splenomegaly or any major cytopenias --continue pulsing with steroids, prednisone 1 mg/kg p.o. daily.  Would not start this until after biopsy is complete as this may alter results. --Ordered CLL prognostic panel, IGHV, and Zap 70  --At time of discharge patient will need follow-up with Dr. Delton Fry at [facility] --Oncology will continue to follow.   # Chylothorax  # Respiratory Failure -- It does not appear that pulsed dose steroids are turning around this patient's bilateral pleural effusions --Discussed the patient's care with pulmonary today.  We discussed options moving forward including addition of rituximab, radiation therapy, and surgical intervention --Reviewed the literature regarding chylothorax in the setting of CLL.  There is no standard treatment in this situation.  Most therapies for CLL do not work quickly, though radiation  is known to prompt a more rapid response. --I reached out to Rad/onc for review of the imaging and to see if they had any intervention to offer.  --Management per pulmonary and primary team.    Lori Peoples, MD Department of Hematology/Oncology [place] at [facility] Phone: [phone] Pager: [phone] Email: [email or web address]

## 2022-02-26 NOTE — Progress Notes (Signed)
PHARMACY - TOTAL PARENTERAL NUTRITION CONSULT NOTE   Indication:  bilateral chylothorax with high chest tube output  Patient Measurements: Height: '5\' 5"'$  (165.1 cm) Weight: 51.9 kg (114 lb 6.7 oz) IBW/kg (Calculated) : 57 TPN AdjBW (KG): 64 Body mass index is 19.04 kg/m.  Assessment: 61 year old female with pertinent PMH of CLL, depression, anxiety, HLD, HTN presents to [facility] ED on 6/2 unresponsive.  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 Patient also has bilateral chylothorax with high output.  Pharmacy has been consulted for TPN.  Glucose / Insulin: BG 83-148 - glargine 8 units daily, required 8 units SSI in past 24 hours. On steroids Electrolytes: CO2 33, others WNL Renal: Scr < 1, BUN 21 Hepatic: WNL, TG 90, albumin 2.0 Intake / Output; MIVF: UOP 0.5 ml/kg/hr, chest tube 190 mls (on octreotide), LBM 6/12  GI Imaging: 6/10 CT Chest Bilateral large pleural effusions and bulky axillary and lower cervical lymphadenopathy  GI Surgeries / Procedures: None this admission  Central access: PICC to be placed 6/13 TPN start date: 6/13  Nutritional Goals: Goal TPN rate is 100 mL/hr (provides 150 g of protein and 2300 kcals per day)  RD Assessment: Estimated Needs Total Energy Estimated Needs: 2300-2600 Total Protein Estimated Needs: 140-160 grams Total Fluid Estimated Needs: >2.5 L  Current Nutrition:  NPO  Plan:  Increase TPN to goal rate of 13m/hr at 1800 Electrolytes in TPN: Na 58m/L, K 2511mL, Ca 5mE57m, Mg 5mEq1m and Phos 15mmo3m Cl:Ac 1:1 Add standard MVI and trace elements to TPN Continue Sensitive q4h SSI and adjust as needed  Monitor TPN labs daily until stable on goal TPN, then every Mon/Thurs  Lorelie Biermann Luisa HartmD, BCPS Clinical Pharmacist 02/26/2022 9:01 AM   Please refer to [facility] phone number

## 2022-02-27 ENCOUNTER — Inpatient Hospital Stay (HOSPITAL_COMMUNITY): Payer: Medicaid Other

## 2022-02-27 ENCOUNTER — Inpatient Hospital Stay: Payer: Self-pay

## 2022-02-27 ENCOUNTER — Ambulatory Visit
Admit: 2022-02-27 | Discharge: 2022-02-27 | Disposition: A | Discharge status: Home / Self Care | Payer: Medicaid Other | Attending: Radiation Oncology | Admitting: Radiation Oncology

## 2022-02-27 ENCOUNTER — Other Ambulatory Visit: Payer: Self-pay

## 2022-02-27 DIAGNOSIS — J940 Chylous effusion: Secondary | ICD-10-CM

## 2022-02-27 DIAGNOSIS — J9601 Acute respiratory failure with hypoxia: Secondary | ICD-10-CM | POA: Diagnosis not present

## 2022-02-27 DIAGNOSIS — D72829 Elevated white blood cell count, unspecified: Secondary | ICD-10-CM | POA: Insufficient documentation

## 2022-02-27 DIAGNOSIS — C9110 Chronic lymphocytic leukemia of B-cell type not having achieved remission: Secondary | ICD-10-CM

## 2022-02-27 DIAGNOSIS — E119 Type 2 diabetes mellitus without complications: Secondary | ICD-10-CM

## 2022-02-27 DIAGNOSIS — R591 Generalized enlarged lymph nodes: Secondary | ICD-10-CM

## 2022-02-27 DIAGNOSIS — C8300 Small cell B-cell lymphoma, unspecified site: Secondary | ICD-10-CM

## 2022-02-27 LAB — CBC WITH DIFFERENTIAL/PLATELET
Abs Immature Granulocytes: 0.24 10*3/uL — ABNORMAL HIGH (ref 0.00–0.07)
Basophils Absolute: 0.1 10*3/uL (ref 0.0–0.1)
Basophils Relative: 0 %
Eosinophils Absolute: 0.1 10*3/uL (ref 0.0–0.5)
Eosinophils Relative: 0 %
HCT: 39.6 % (ref 36.0–46.0)
Hemoglobin: 12.3 g/dL (ref 12.0–15.0)
Immature Granulocytes: 0 %
Lymphocytes Relative: 92 %
Lymphs Abs: 90.3 10*3/uL — ABNORMAL HIGH (ref 0.7–4.0)
MCH: 28.5 pg (ref 26.0–34.0)
MCHC: 31.1 g/dL (ref 30.0–36.0)
MCV: 91.7 fL (ref 80.0–100.0)
Monocytes Absolute: 0.5 10*3/uL (ref 0.1–1.0)
Monocytes Relative: 1 %
Neutro Abs: 7.1 10*3/uL (ref 1.7–7.7)
Neutrophils Relative %: 7 %
Platelets: 457 10*3/uL — ABNORMAL HIGH (ref 150–400)
RBC: 4.32 MIL/uL (ref 3.87–5.11)
RDW: 18 % — ABNORMAL HIGH (ref 11.5–15.5)
WBC: 98.2 10*3/uL (ref 4.0–10.5)
nRBC: 0 % (ref 0.0–0.2)

## 2022-02-27 LAB — RAD ONC ARIA SESSION SUMMARY
Course Elapsed Days: 0
Plan Fractions Treated to Date: 1
Plan Prescribed Dose Per Fraction: 2.5 Gy
Plan Total Fractions Prescribed: 10
Plan Total Prescribed Dose: 25 Gy
Reference Point Dosage Given to Date: 2.5 Gy
Reference Point Session Dosage Given: 2.5 Gy
Session Number: 1

## 2022-02-27 LAB — COMPREHENSIVE METABOLIC PANEL
ALT: 35 U/L (ref 0–44)
AST: 33 U/L (ref 15–41)
Albumin: 2.2 g/dL — ABNORMAL LOW (ref 3.5–5.0)
Alkaline Phosphatase: 71 U/L (ref 38–126)
Anion gap: 4 — ABNORMAL LOW (ref 5–15)
BUN: 28 mg/dL — ABNORMAL HIGH (ref 8–23)
CO2: 30 mmol/L (ref 22–32)
Calcium: 8.1 mg/dL — ABNORMAL LOW (ref 8.9–10.3)
Chloride: 103 mmol/L (ref 98–111)
Creatinine, Ser: 0.49 mg/dL (ref 0.44–1.00)
GFR, Estimated: >60 mL/min (ref >60)
Glucose, Bld: 93 mg/dL (ref 70–99)
Potassium: 4.7 mmol/L (ref 3.5–5.1)
Sodium: 137 mmol/L (ref 135–145)
Total Bilirubin: 0.3 mg/dL (ref 0.3–1.2)
Total Protein: 4.9 g/dL — ABNORMAL LOW (ref 6.5–8.1)

## 2022-02-27 LAB — GLUCOSE, CAPILLARY
Glucose-Capillary: 214 mg/dL — ABNORMAL HIGH (ref 70–99)
Glucose-Capillary: 106 mg/dL — ABNORMAL HIGH (ref 70–99)
Glucose-Capillary: 171 mg/dL — ABNORMAL HIGH (ref 70–99)
Glucose-Capillary: 315 mg/dL — ABNORMAL HIGH (ref 70–99)
Glucose-Capillary: 197 mg/dL — ABNORMAL HIGH (ref 70–99)

## 2022-02-27 LAB — MAGNESIUM: Magnesium: 2 mg/dL (ref 1.7–2.4)

## 2022-02-27 LAB — PHOSPHORUS: Phosphorus: 1.9 mg/dL — ABNORMAL LOW (ref 2.5–4.6)

## 2022-02-27 LAB — TRIGLYCERIDES: Triglycerides: 108 mg/dL (ref <150)

## 2022-02-27 MED ORDER — BOOST / RESOURCE BREEZE PO LIQD CUSTOM
1.0000 | Freq: Two times a day (BID) | ORAL | Status: DC
  Administered 2022-02-27 – 2022-03-05 (×12): 1 via ORAL

## 2022-02-27 MED ORDER — TRACE MINERALS CU-MN-SE-ZN 300-55-60-3000 MCG/ML IV SOLN
INTRAVENOUS | Status: DC
  Filled 2022-02-27: qty 1000

## 2022-02-27 MED ORDER — SODIUM PHOSPHATES 45 MMOLE/15ML IV SOLN
15.0000 mmol | Freq: Once | INTRAVENOUS | Status: AC
  Administered 2022-02-27: 15 mmol via INTRAVENOUS
  Filled 2022-02-27: qty 5

## 2022-02-27 NOTE — Progress Notes (Signed)
Nutrition Follow-up  DOCUMENTATION CODES:   Severe malnutrition in context of chronic illness, Underweight  INTERVENTION:   -TPN management per Pharmacy  -Boost Breeze po BID, each supplement provides 250 kcal and 9 grams of protein    NUTRITION DIAGNOSIS:   Severe Malnutrition related to chronic illness (CLL) as evidenced by severe muscle depletion, severe fat depletion.  Ongoing.  GOAL:   Patient will meet greater than or equal to 90% of their needs  Will meet with TPN  MONITOR:   Labs, Weight trends, Skin, I & O's, Diet advancement, TPN, PO intakes  ASSESSMENT:   61 yo female admitted with AMS and unresponsiveness. PMH includes CLL, depression, anxiety, HLD, HTN, lymphadenopathy, iron deficiency.  6/02 - admitted unresponsive, intubated 6/03 - s/p right chest tube placement, s/p L thoracentesis with 2 L removed 6/04 - extubated 6/08 - s/p thoracentesis with 1.4 L fluid removed 6/10 - chest tube removed 6/12 - s/p right chest tube placement with 3.1 L drained  Transferred from [facility] to [facility] 6/13 for initiation of palliative radiotherapy.  Output from chest tube: 700 ml x 24 hours  Patient in chair during visit. Has a lot of questions regarding food items she is allowed to have. She is currently restricted to 7g fat per meal.  Pt states she ate some [place] sausage, oatmeal and toast this morning. Her lunch meal arrived at end of visit but pt about to go radiation. Provided pt her fruit to eat off tray. Pt would like Boost Breeze supplements  again, states she feels really hungry. Has not been tolerating milk products well.  TPN to resume today at 1800 at 100 ml/hr (providing 2357 kcals and 150g protein).  Admission weight: 141 lbs. Current weight: 111 lbs  Medications: Colace, Octreotide, Miralax, D10 infusion, sodium phosphate, Zofran  Labs reviewed:  CBGs: 106-171 Low Phos Vitamin A and Zinc labs still pending  Diet Order:   Diet Order             Diet  regular Room service appropriate? Yes; Fluid consistency: Thin  Diet effective now                   EDUCATION NEEDS:   Education needs have been addressed  Skin:  Skin Assessment: Skin Integrity Issues: Skin Integrity Issues:: Stage I Stage I: sacrum  Last BM:  6/15 -type 4  Height:   Ht Readings from Last 1 Encounters:  02/18/2022 '5\' 5"'$  (1.651 m)    Weight:   Wt Readings from Last 1 Encounters:  02/26/22 50.6 kg    Ideal Body Weight:  56.8 kg  BMI:  Body mass index is 18.56 kg/m.  Estimated Nutritional Needs:   Kcal:  6378-5885  Protein:  140-160 grams  Fluid:  >2.5 L   Clayton Bibles, MS, RD, LDN Inpatient Clinical Dietitian Contact information available via Amion

## 2022-02-27 NOTE — Progress Notes (Signed)
NAME:  [name], MRN:  [id number], DOB:  03/22/1961, LOS: 26 ADMISSION DATE:  03/07/2022, CONSULTATION DATE:  6/2 REFERRING MD:  Dr. Alvino Chapel, CHIEF COMPLAINT:  Unresponsive; Acute resp failure   BRIEF  Patient is a 61 year old female with pertinent PMH of CLL, depression, anxiety, HLD, HTN presents to [facility] ED on 6/2 unresponsive.  On 6/2 patient called EMS for abdominal pain.  Patient initially was alert and talking but became unresponsive with AMS.  Patient was hypoxic with agonal breathing and was placed on NRB.  Patient was noted to be hypotensive.  Patient transported to [facility] ED.  Upon arrival to [facility] ED patient remained unresponsive with agonal respirations.  Patient intubated for airway protection and hypoxia.  BP initially 81/65.  Given IV fluids.  Started on levo.  Fever 100.9 F and WBC 104.4.  Started on cefepime, Flagyl, Vanco.  Cultures pending.  Glucose 198, BNP 534, troponin 109 then 112, LA 4.4 then 1.8, UA unremarkable, UDS negative.  CXR with large left pleural effusion and small right pleural effusion.  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  CT abdomen with ascending colitis constipation, small intestine wall thickening possibly related to gastroenteritis; small volume ascites; extensive abdominopelvic and inguinal adenopathy.   PCCM was consulted for bilateral pleural effusion  Pertinent  Medical History    has a past medical history of Anxiety, Chronic abdominal pain, Chronic lymphocytic leukemia (CLL), B-cell ([place]) (04/04/2016), CLL (chronic lymphocytic leukemia) ([place]), Depression with anxiety (07/07/2015), Family history of hyperlipidemia (12/14/2019), H. 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   has a past surgical history that includes Surgery of left middle finger on left hand (childhood ); Tubal ligation; Cholecystectomy; Colonoscopy (02/07/2011); Hammer toe surgery (Left); Axillary lymph node biopsy (Right, 03/28/2016); Hysteroscopy with D & C (N/A, 07/30/2016); Thoracentesis (Left, 03/14/2022); and Chest tube insertion (N/A, 03/05/2022).     Significant Hospital Events: Including procedures, antibiotic start and stop dates in addition to other pertinent events   6/2: Patient admitted to [facility] ED unresponsive; intubated; shock started on levo; transferred to [facility]. CT chest and abd: 1. Extensive abdominopelvic and inguinal adenopathy, most likely related to lymphoma/leukemia. Numerous small hypodense splenic lesions versus bolus phase phenomenon. Intrahepatic periportal edema which is probably either due to fluid overload or hepatic dysfunction. The portal vein is patent within normal caliber limits. Chronic calcific pancreatitis but no findings of acute pancreatitis or ductal dilatation.  Ascending colitis versus nondistention, remainder of the colon with constipation. Diffuse small intestinal wall thickening with gastric fold thickening, could relate to gastroenteritis or hepatic dysfunction versus reactive from ascites versus congestive or infiltrating disease. 6/3 right chest tube placed. Left thora 2000 ml removed - milky 6/4 extubated -exudative, cx negative, lymphocytic. 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 Off pressors 6/6 Transferred to [facility] 6/6 ultrasound-guided right axillary lymph node biopsy -Started on pulse dose steroids at 50 mg prednisone 6/7 chest tube output over a liter 6/8 left thoracentesis for 1400 cc of fluid 6/10 - chest tube removed 6/12 - s/p right chest tube placement with 3.1 L drained  6/3 - Transferred from [facility] to [facility] 6/13 for initiation of palliative radiotherapy 02/26/22 - No chest pain. Saturation 96% on 2 L.  Chest tube drained 190 cc  of milky fluid  Interim History / Subjective:   02/27/22 - now at [facility] long hospital bed 1610. Per RN - fall earlier today . CXR stable. Doing well. Got SIM for XRT.  On room air. HAs Chest tube right side draining chylothorax. Has mod-large effusion on the left side -refused chest tube.    Objective   Blood pressure 103/77, pulse (!) 108, temperature 99.4 F (37.4 C), temperature source Oral, resp. rate (!) 32, height '5\' 5"'$  (1.651 m), weight 50.6 kg, last menstrual period 07/25/2016, SpO2 94 %.        Intake/Output Summary (Last 24 hours) at 02/27/2022 1312 Last data filed at 02/27/2022 0905 Gross per 24 hour  Intake 1565.11 ml  Output 900 ml  Net 665.11 ml   Filed Weights   02/15/2022 0500 03/12/2022 0744 02/26/22 2314  Weight: 53.1 kg 51.9 kg 50.6 kg   Physical Exam: General Appearance:  Looks frail. But alert and comfortrable Head:  Normocephalic, without obvious abnormality, atraumatic Eyes:  PERRL - yes, conjunctiva/corneas - muddy     Ears:  Normal external ear canals, both ears Nose:  G tube - no Throat:  ETT TUBE - no , OG tube - no Neck:  Supple,  No enlargement/tenderness/nodules Lungs: Clear to auscultation bilaterally, RT CHEST TUBE - chylothorax + Heart:  S1 and S2 normal, no murmur, CVP - no.  Pressors - no Abdomen:  Soft, no masses, no organomegaly Genitalia / Rectal:  Not done Extremities:  Extremities- intact Skin:  ntact in exposed areas . Sacral area - x Neurologic:  Sedation - none -> RASS - +! Marland Kitchen Moves all 4s - yes. CAM-ICU - neg . Orientation - x3+     Assessment & Plan:  Bilateral chylothorax Acute hypoxic respiratory failure due to above  Left effusion is wors over time - refused left chst tube  , right chest tube continues to drain  Plan  - continuie  octreotide to 50 mcg 3 times daily  in attempt to slow chyle production   Protein calorie malnutrition, moderate to severe - per triad  CLL/SLL -Per oncology, on  prednisone Rituximab being considered  Chronic HFpEF due to uncontrolled hypertension  Per triad       SIGNATURE    Dr. Brand Males, M.D., F.C.C.P,  Pulmonary and Critical Care Medicine Staff Physician, [place] Director - Interstitial Lung Disease  Program  Medical Director - [place] [facility] Three ICU Pulmonary [place] at [place], [place], [address]  NPI Number:  NPI #[id number] DEA Number: [id number]  Pager: [phone], If no answer  -> Check AMION or Try [place] Telephone (clinical office): [phone] Telephone (research): [phone]  7:47 PM 02/27/2022    LABS    PULMONARY No results for input(s): "PHART", "PCO2ART", "PO2ART", "HCO3", "TCO2", "O2SAT" in the last 168 hours.  Invalid input(s): "PCO2", "PO2"  CBC Recent Labs  Lab 02/25/22 0056 02/26/22 0950 02/27/22 0613  HGB 11.0* 11.2* 12.3  HCT 36.3 35.8* 39.6  WBC 92.1* 86.8* 98.2*  PLT 474* 508* 457*    COAGULATION No results for input(s): "INR" in the last 168 hours.  CARDIAC  No results for input(s): "TROPONINI" in the last 168 hours. No results for input(s): "PROBNP" in the last 168 hours.   CHEMISTRY Recent Labs  Lab 02/21/22 0710 02/22/22 0135 02/23/22 0031 02/27/2022 0413 02/25/22 0056 02/26/22 0341 02/27/22 0613  NA 138   < > 135 138  137 138 137  K 5.2*   < > 5.3* 4.8 5.1 4.7 4.7  CL 103   < > 100 101 102 101 103  CO2 28   < > '28 30 30 '$ 33* 30  GLUCOSE 93   < > 233* 144* 148* 128* 93  BUN 22   < > '19 15 20 21 '$ 28*  CREATININE 0.55   < > 0.50 0.41* 0.43* 0.45 0.49  CALCIUM 8.0*   < > 8.1* 8.0* 7.8* 8.1* 8.1*  MG 1.9  --   --   --  1.9 2.0 2.0  PHOS  --   --   --   --   --  3.2 1.9*   < > = values in this interval not displayed.   Estimated Creatinine Clearance: 59 mL/min (by C-G formula based on SCr of 0.49 mg/dL).   LIVER Recent Labs  Lab 02/21/22 0710 02/26/22 0341 02/27/22 0613  AST 27 36 33  ALT  20 39 35  ALKPHOS 77 69 71  BILITOT 0.1* 0.3 0.3  PROT 4.5* 4.6* 4.9*  ALBUMIN 1.8* 2.0* 2.2*     INFECTIOUS No results for input(s): "LATICACIDVEN", "PROCALCITON" in the last 168 hours.   ENDOCRINE CBG (last 3)  Recent Labs    02/27/22 0720 02/27/22 1230 02/27/22 1839  GLUCAP 106* 171* 315*         IMAGING x48h  - image(s) personally visualized  -   highlighted in bold DG Chest 1 View  Result Date: 02/27/2022 CLINICAL DATA:  Chest crackles.  Chest tube placement. EXAM: CHEST  1 VIEW COMPARISON:  Earlier same day.  02/26/2022. FINDINGS: No visible pleural fluid on the right. No right pneumothorax. Mild atelectasis at the right lung base. Pleural catheter overlies the lower thoracic midline. Large left pleural effusion is unchanged with collapse of the left lower lobe. Skin fold present on the left. Right arm PICC has been removed. IMPRESSION: Pleural catheter projected over the midline. No visible right effusion or right pneumothorax. Persistent large effusion on the left with left lower lobe collapse. Electronically Signed   By: Nelson Chimes M.D.   On: 02/27/2022 11:48   [place] EKG SITE RITE  Result Date: 02/27/2022 If Site Rite image not attached, placement could not be confirmed due to current cardiac rhythm.  DG Chest Port 1 View  Result Date: 02/27/2022 CLINICAL DATA:  Respiratory failure.  Chest tube. EXAM: PORTABLE CHEST 1 VIEW COMPARISON:  Portable chest yesterday at 5:50 a.m. FINDINGS: 5:07 a.m., 02/27/2022.  Patient is moderately rotated to the left. Pigtail of the pleural catheter is now left of the midline at the level of T12. No pneumothorax is seen. There is only a small right pleural effusion remaining and it appears improved. Right PICC tip remains at the superior cavoatrial junction. A large left pleural effusion again obscures the lower 2/3 of the left chest. There is aortic atherosclerosis and tortuosity. The cardiac size is stable. Remaining lungs clear with COPD  change. There are surgical clips in the right axilla. Osteopenia and degenerative change. IMPRESSION: 1. The pigtail of the pleural catheter is now left of the midline in the base of the chest, exact position unclear but there is only minimal right pleural fluid remaining today. 2. Large left pleural effusion obscuring the lower 2/3 of the left chest,, stable. 3. COPD, aortic atherosclerosis. Electronically Signed   By: Telford Nab M.D.   On: 02/27/2022 06:52   DG Chest [facility] 1 View  Result  Date: 02/26/2022 CLINICAL DATA:  Pleural effusions. EXAM: PORTABLE CHEST 1 VIEW COMPARISON:  Chest x-ray 03/06/2022. FINDINGS: The right chest tube has changed position. It is now likely in the posterior inferior right pleural space but difficult to be certain without a lateral film. Small residual right pleural effusion and right basilar atelectasis. No pneumothorax. Persistent large left pleural effusion with overlying atelectasis. New right-sided PICC line is noted.  The tip is in the right atrium. IMPRESSION: 1. The right chest tube has changed position and is now likely in the posterior inferior right pleural space but difficult to be certain without a lateral film. 2. Persistent large left pleural effusion with overlying atelectasis. 3. New right PICC line with tip in the right atrium. Electronically Signed   By: Marijo Sanes M.D.   On: 02/26/2022 07:58

## 2022-02-27 NOTE — Consult Note (Signed)
Radiation Oncology         [id number]) [id number] ________________________________  Name: [name]        MRN: [id number]  Date of Service: 02/27/22 DOB: Aug 31, 1961  WF:UXNA, Trinna Balloon, NP     REFERRING PHYSICIAN: Dr. Lorenso Courier   DIAGNOSIS: The primary encounter diagnosis was Sepsis, due to unspecified organism, unspecified whether acute organ dysfunction present [facility] A diagnosis of CLL (chronic lymphocytic leukemia) (HCC) was also pertinent to this visit.   HISTORY OF PRESENT ILLNESS: [name] is a 61 y.o. female seen at the request of Dr. Lorenso Courier with a diagnosis of CLL who is currently hospitalized with bulky adenopathy in the mediastinum. 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 She has been followed in observation but has not received chemotherapy.   She presented to the ED on 02/16/2022 via EMS and was noted to have agonal breathing and was intubated in the ED at [facility] and transferred to [facility]. She was found to have bilateral effusions by CT angiography and passive atelectasis, and extensive supraclavicular, lower neck adenopathy, and multiple axillary and chest wall masses infiltrating the breasts, and thoracentesis on 02/15/22 showed atypical lymphocytes suspicious for lymphoproliferative process, and a right axillary node was biopsies on 02/18/22 showing B cells consistent with her history of lymphoma and CLL. Repeat thoracentesis on 03/08/2022 and ultimately a Chest Tube was placed on 03/12/2022. A CT chest without contrast on 02/22/22 showed tiny right apical pneumothorax, bilateral effusions with collapse/consolidation in the lower lungs, and bilateral axillary, lower cerivcal, and probably bulky mediastinal adenopathy was noted and atherosclerosis was noted. During this time pulse dosed steroids were used and unfortunately did not seem to make a clinical improvement for  her. 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    PREVIOUS RADIATION THERAPY: No   PAST MEDICAL HISTORY:  Past Medical History:  Diagnosis Date   Anxiety    Chronic abdominal pain    resolved   Chronic lymphocytic leukemia (CLL), B-cell ([place]) 04/04/2016   CLL (chronic lymphocytic leukemia) ([place])    Depression with anxiety 07/07/2015   Family history of hyperlipidemia 12/14/2019   H. pylori infection 2/09   s/p prevpac   HAND PAIN, BILATERAL 01/29/2009   Qualifier: Diagnosis of  By: Moshe Cipro MD, Margaret     Hypertension    Iron deficiency 11/17/2016   Lymphadenopathy 01/17/2016   Medical non-compliance 08/22/2017   Nicotine addiction    Psychosis ([place])    social worker states that patient does not like to talk about this and will stop her meds if you try and discuss this with her.   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  Otherwise normal colon without evidence of polyps, masses, inflammatory changes, diverticular AVMs  HAMMER TOE SURGERY Left    HYSTEROSCOPY WITH D & C N/A 07/30/2016   Procedure: DILATATION AND CURETTAGE /HYSTEROSCOPY;  Surgeon: Florian Buff, MD;  Location: [facility];  Service: Gynecology;  Laterality: N/A;   Surgery of left middle finger on left hand (childhood )     THORACENTESIS Left 03/10/2022   Procedure: THORACENTESIS;  Surgeon: Laurin Coder, MD;  Location: [place] [facility];  Service: Pulmonary;  Laterality: Left;  enlarging left effusion   TUBAL LIGATION       FAMILY HISTORY:  Family History  Problem Relation Age of Onset   Cancer Mother    Hypertension Daughter      SOCIAL HISTORY:  reports that she has been smoking cigarettes. She has never used smokeless tobacco. She reports that she does not currently use alcohol. She reports that she does not use drugs.   ALLERGIES: Feraheme [ferumoxytol]   MEDICATIONS:  Current Facility-Administered Medications  Medication Dose Route Frequency Provider Last Rate Last Admin   0.9 %  sodium chloride infusion  250 mL Intravenous Continuous Olalere, Adewale A, MD 10 mL/hr at 02/27/22 0309 Infusion Verify at 02/27/22 0309   acetaminophen (TYLENOL) tablet 650 mg  650 mg Per Tube Q4H PRN Olalere, Adewale A, MD   650 mg at 02/26/22 2029   chlorhexidine gluconate (MEDLINE KIT) (PERIDEX) 0.12 % solution 15 mL  15 mL Mouth Rinse BID Olalere, Adewale A, MD   15 mL at 02/26/22 2030   Chlorhexidine Gluconate Cloth 2 % PADS 6 each  6 each Topical Daily Eugenie Filler, MD   6 each at 02/26/22 0929   dextrose 10 % infusion   Intravenous Continuous Eugenie Filler, MD 100 mL/hr at 02/27/22 0309 Infusion Verify at 02/27/22 0309   docusate sodium (COLACE) capsule 100 mg  100 mg Oral BID Olalere, Adewale A, MD   100 mg at 02/26/22 2101   enoxaparin (LOVENOX) injection 40 mg  40 mg Subcutaneous Q24H Olalere, Adewale A, MD   40 mg at 02/25/22 1612   insulin aspart (novoLOG) injection 0-9 Units  0-9 Units Subcutaneous Q4H Eugenie Filler, MD   3 Units at 02/27/22 0352   insulin glargine-yfgn (SEMGLEE) injection 8 Units  8 Units Subcutaneous Daily Olalere, Adewale A, MD   8 Units at 02/26/22 0929   octreotide (SANDOSTATIN) injection 50 mcg  50 mcg Subcutaneous TID Eugenie Filler, MD   50 mcg at 02/26/22 2107   ondansetron (ZOFRAN) injection 4 mg  4 mg Intravenous Q6H PRN Olalere,  Adewale A, MD   4 mg at 02/19/22 0607   polyethylene glycol (MIRALAX / GLYCOLAX) packet 17 g  17 g Oral Daily Olalere, Adewale A, MD   17 g at 02/26/22 0929   predniSONE (DELTASONE) tablet 50 mg  50 mg Oral Q breakfast Olalere, Adewale A, MD   50 mg at 02/26/22 0900   sodium chloride flush (NS) 0.9 % injection 10 mL  10 mL Other Q8H Kara Mead V, MD   10 mL at 02/27/22 0443   sodium chloride flush (NS) 0.9 % injection 10-40 mL  10-40 mL Intracatheter Q12H Eugenie Filler, MD   10 mL at 02/26/22 2102   sodium chloride flush (NS) 0.9 % injection 10-40 mL  10-40 mL Intracatheter PRN Eugenie Filler, MD         REVIEW OF SYSTEMS: On review of systems, the patient reports that she feels like her breathing is much improved and she denies any  feeling of shortness of breath or cough. She has not needed oxygen since extubation around 02/18/22. No other complaints are verbalized.      PHYSICAL EXAM:  Wt Readings from Last 3 Encounters:  02/26/22 111 lb 8.8 oz (50.6 kg)  03/08/20 142 lb (64.4 kg)  02/07/20 151 lb (68.5 kg)   Temp Readings from Last 3 Encounters:  02/27/22 98.1 F (36.7 C) (Oral)  02/07/20 97.8 F (36.6 C) (Temporal)  12/19/19 (!) 97.5 F (36.4 C) (Temporal)   BP Readings from Last 3 Encounters:  02/27/22 123/90  03/08/20 (!) 142/86  02/07/20 (!) 144/88   Pulse Readings from Last 3 Encounters:  02/27/22 95  03/08/20 96  02/07/20 85   Pain Assessment Pain Score: 3 /10  In general this is a chronically ill appearing African American female in no acute distress. She's alert and oriented x4 and appropriate throughout the examination. Cardiopulmonary assessment is negative for acute distress and she exhibits normal effort.     ECOG = 2 due to chest tube.  0 - Asymptomatic (Fully active, able to carry on all predisease activities without restriction)  1 - Symptomatic but completely ambulatory (Restricted in physically strenuous activity but ambulatory and able to  carry out work of a light or sedentary nature. For example, light housework, office work)  2 - Symptomatic, <50% in bed during the day (Ambulatory and capable of all self care but unable to carry out any work activities. Up and about more than 50% of waking hours)  3 - Symptomatic, >50% in bed, but not bedbound (Capable of only limited self-care, confined to bed or chair 50% or more of waking hours)  4 - Bedbound (Completely disabled. Cannot carry on any self-care. Totally confined to bed or chair)  5 - Death   Eustace Pen MM, Creech RH, Tormey DC, et al. [phone]). "Toxicity and response criteria of the [facility]". [place] Oncol. 5 (6): 649-55    LABORATORY DATA:  Lab Results  Component Value Date   WBC 98.2 (HH) 02/27/2022   HGB 12.3 02/27/2022   HCT 39.6 02/27/2022   MCV 91.7 02/27/2022   PLT 457 (H) 02/27/2022   Lab Results  Component Value Date   NA 137 02/27/2022   K 4.7 02/27/2022   CL 103 02/27/2022   CO2 30 02/27/2022   Lab Results  Component Value Date   ALT 35 02/27/2022   AST 33 02/27/2022   ALKPHOS 71 02/27/2022   BILITOT 0.3 02/27/2022      RADIOGRAPHY: DG Chest Port 1 View  Result Date: 02/27/2022 CLINICAL DATA:  Respiratory failure.  Chest tube. EXAM: PORTABLE CHEST 1 VIEW COMPARISON:  Portable chest yesterday at 5:50 a.m. FINDINGS: 5:07 a.m., 02/27/2022.  Patient is moderately rotated to the left. Pigtail of the pleural catheter is now left of the midline at the level of T12. No pneumothorax is seen. There is only a small right pleural effusion remaining and it appears improved. Right PICC tip remains at the superior cavoatrial junction. A large left pleural effusion again obscures the lower 2/3 of the left chest. There is aortic atherosclerosis and tortuosity. The cardiac size is stable. Remaining lungs clear with COPD change. There are surgical clips in the right axilla. Osteopenia and degenerative change. IMPRESSION: 1. The pigtail of  the pleural catheter is now left of the midline in the base of the chest, exact position unclear but there is only minimal right pleural fluid remaining today. 2. Large left  pleural effusion obscuring the lower 2/3 of the left chest,, stable. 3. COPD, aortic atherosclerosis. Electronically Signed   By: Telford Nab M.D.   On: 02/27/2022 06:52   DG Chest Port 1 View  Result Date: 02/26/2022 CLINICAL DATA:  Pleural effusions. EXAM: PORTABLE CHEST 1 VIEW COMPARISON:  Chest x-ray 03/01/2022. FINDINGS: The right chest tube has changed position. It is now likely in the posterior inferior right pleural space but difficult to be certain without a lateral film. Small residual right pleural effusion and right basilar atelectasis. No pneumothorax. Persistent large left pleural effusion with overlying atelectasis. New right-sided PICC line is noted.  The tip is in the right atrium. IMPRESSION: 1. The right chest tube has changed position and is now likely in the posterior inferior right pleural space but difficult to be certain without a lateral film. 2. Persistent large left pleural effusion with overlying atelectasis. 3. New right PICC line with tip in the right atrium. Electronically Signed   By: Marijo Sanes M.D.   On: 02/26/2022 07:58   [place] EKG SITE RITE  Result Date: 02/25/2022 If Site Rite image not attached, placement could not be confirmed due to current cardiac rhythm.  DG Chest Port 1 View  Result Date: 02/18/2022 CLINICAL DATA:  Chest tube placement EXAM: PORTABLE CHEST 1 VIEW COMPARISON:  02/22/2022 FINDINGS: A right pigtail pleural drainage catheter is in place. The pneumothorax component of the right-sided hydropneumothorax is no longer appreciated. Reduced right pleural fluid. There is continued blunting of the right lateral costophrenic angle likely reflecting some residual pleural effusion. There is also a large left pleural effusion encompassing well over half of the left hemithorax.  Atherosclerotic calcification of the aortic arch. IMPRESSION: 1. Substantially reduced right pleural fluid, status post pleural pigtail drainage catheter placement. The pneumothorax component of the right hydropneumothorax is no longer seen. 2. Large left pleural effusion. 3.  Aortic Atherosclerosis (ICD10-I70.0). Electronically Signed   By: Van Clines M.D.   On: 02/27/2022 13:48   CT CHEST WO CONTRAST  Result Date: 02/22/2022 CLINICAL DATA:  Pleural effusion. Malignancy suspected. * Tracking Code: BO * EXAM: CT CHEST WITHOUT CONTRAST TECHNIQUE: Multidetector CT imaging of the chest was performed following the standard protocol without IV contrast. RADIATION DOSE REDUCTION: This exam was performed according to the departmental dose-optimization program which includes automated exposure control, adjustment of the mA and/or kV according to patient size and/or use of iterative reconstruction technique. COMPARISON:  Chest CTA 02/16/2022 FINDINGS: Cardiovascular: The heart size is normal. No substantial pericardial effusion. Coronary artery calcification is evident. Mild atherosclerotic calcification is noted in the wall of the thoracic aorta. Mediastinum/Nodes: Assessment in mediastinum limited by lack of intravenous contrast extensive pleural fluid. Possible 15 mm subcarinal short axis lymph node on 81/3. Imaging appearance suspicious for right hilar lymphadenopathy although again this is not well assessed given lack of intravenous contrast. Bulky axillary lymphadenopathy seen bilaterally. As before, there appears to be bulky adenopathy in the lower neck, poorly visualized given lack of intravenous contrast. Lungs/Pleura: Bilateral large pleural effusions noted with prominent sub pulmonic components. There is dependent collapse/consolidation in the lower lungs. Centrilobular emphsyema noted. Tiny right apical pneumothorax evident. Upper Abdomen: Not well seen. Musculoskeletal: No worrisome lytic or  sclerotic osseous abnormality. IMPRESSION: 1. Tiny right apical pneumothorax. 2. Bilateral large pleural effusions with dependent collapse/consolidation in the lower lungs. 3. Bulky axillary and lower cervical lymphadenopathy, poorly visualized given lack of intravenous contrast. Probable mediastinal lymphadenopathy. 4. Aortic Atherosclerosis (ICD10-I70.0). These results  will be called to the ordering clinician or representative by the Radiologist Assistant, and communication documented in the PACS or [employer]. Electronically Signed   By: Misty Stanley M.D.   On: 02/22/2022 16:34   DG CHEST PORT 1 VIEW  Result Date: 02/22/2022 CLINICAL DATA:  Pleural effusion. EXAM: PORTABLE CHEST 1 VIEW COMPARISON:  Chest radiograph 02/21/2022 FINDINGS: The cardiomediastinal silhouette is grossly unchanged, with the heart remaining partially obscured. Aortic atherosclerosis is noted. A small right apical pneumothorax is unchanged. A pleural catheter projects near the inferior aspect of the right lateral costophrenic sulcus, unchanged. Small right and moderate left pleural effusions appear mildly increased, although this may be partly due to differences in patient positioning. Mild diffuse interstitial opacities and bibasilar airspace opacities have not significantly changed. IMPRESSION: 1. Unchanged small right apical pneumothorax. 2. Mildly increased size of left larger than right pleural effusions. 3. Unchanged interstitial and airspace opacities which may reflect mild edema and atelectasis. Electronically Signed   By: Logan Bores M.D.   On: 02/22/2022 10:21   DG Chest Port 1 View  Result Date: 02/21/2022 CLINICAL DATA:  Pneumothorax EXAM: PORTABLE CHEST 1 VIEW COMPARISON:  02/23/2022 FINDINGS: Unchanged AP portable chest radiograph with a small, less than 10% right apical pneumothorax. Unchanged left greater than right layering bilateral pleural effusions and diffuse bilateral interstitial opacity. Heart and  mediastinum are unremarkable. IMPRESSION: 1. Unchanged AP portable chest radiograph with a small, less than 10% right apical pneumothorax. 2. Unchanged left greater than right layering bilateral pleural effusions and diffuse bilateral interstitial opacity, likely edema. Electronically Signed   By: Delanna Ahmadi M.D.   On: 02/21/2022 09:17   DG Chest Port 1 View  Result Date: 03/06/2022 CLINICAL DATA:  Postprocedure. EXAM: PORTABLE CHEST 1 VIEW COMPARISON:  Chest x-ray 02/19/2022.  Chest CT 02/15/2022. FINDINGS: Right pleural drainage catheter is unchanged. There is a small left pleural effusion which has significantly decreased from prior. There is a small right apical pneumothorax measuring 1 cm from the lung apex. No left-sided pneumothorax visualized. No mediastinal shift. The cardiomediastinal silhouette is within normal limits. The osseous structures are stable. IMPRESSION: 1. Small right apical pneumothorax. Right pleural drainage catheter in place. 2. Small left pleural effusion has decreased from prior. Electronically Signed   By: Ronney Asters M.D.   On: 02/13/2022 16:08   DG CHEST PORT 1 VIEW  Result Date: 02/19/2022 CLINICAL DATA:  Colles thorax.  Chest tube follow-up. EXAM: PORTABLE CHEST 1 VIEW COMPARISON:  02/17/2022 FINDINGS: Thoracostomy tube in the lower lateral right chest remains in place. No pleural air. No visible pleural fluid on the right. Increasing amount of pleural fluid on the left, with complete collapse of the left lower lobe and subtotal collapse of the left upper lobe. Central line has been removed. IMPRESSION: Persistent right pleural catheter. No right pleural air or visible pleural fluid. Enlarging volume of pleural fluid on the left, now with complete collapse of the left lower lobe and subtotal collapse of the left upper lobe. Electronically Signed   By: Nelson Chimes M.D.   On: 02/19/2022 07:25   [place] CORE BIOPSY (LYMPH NODES)  Result Date: 02/18/2022 INDICATION:  61 year old female with history CLL presenting multifocal bilateral axillary masses. EXAM: Ultrasound-guided axillary lymph node biopsy MEDICATIONS: None. ANESTHESIA/SEDATION: None. FLUOROSCOPY TIME:  None. COMPLICATIONS: None immediate. PROCEDURE: Informed written consent was obtained from the patient after a thorough discussion of the procedural risks, benefits and alternatives. All questions were addressed. Maximal Sterile Barrier Technique  was utilized including caps, mask, sterile gowns, sterile gloves, sterile drape, hand hygiene and skin antiseptic. A timeout was performed prior to the initiation of the procedure. Preprocedure ultrasound evaluation demonstrated a prominent, subcutaneous heterogeneously hypoechoic enlarged lymph node measuring approximately 3.3 x 1.9 cm in maximum longitudinal dimensions. The procedure was planned. The right axilla was prepped and draped in standard fashion. Subdermal Local anesthesia was administered with 1% lidocaine at the planned needle entry site. A small skin nick was made. Under direct ultrasound visualization, a 17 gauge introducer needle was directed to the periphery of the lymph node. Next, a total of 4, 18 gauge core biopsies were obtained. Two samples were placed in saline and 2 samples were placed in formalin. The needle was removed. Postprocedure ultrasound demonstrated no evidence of surrounding hematoma or other complicating features. Hemostasis was achieved with brief manual compression. A sterile bandage was applied. The patient tolerated the procedure well without complication. The patient was transferred back to the floor in stable condition. IMPRESSION: Technically successful ultrasound-guided right axillary lymph node biopsy. Ruthann Cancer, MD [facility] Electronically Signed   By: Ruthann Cancer M.D.   On: 02/18/2022 17:53   DG CHEST PORT 1 VIEW  Result Date: 02/17/2022 CLINICAL DATA:  Pleural  effusion EXAM: PORTABLE CHEST 1 VIEW COMPARISON:  February 16, 2022 FINDINGS: The heart size and mediastinal contours are stable. Right central venous line is unchanged. Previously noted endotracheal tube and nasogastric tube have been removed. Patchy consolidation of the left mid and lung base with a left pleural effusion are noted. Stable patchy consolidation of right lung base is noted. The visualized skeletal structures are stable. IMPRESSION: New patchy consolidation of the left mid and lung base with a left pleural effusion, suspicious for pneumonia. Stable patchy consolidation of right lung base. Electronically Signed   By: Abelardo Diesel M.D.   On: 02/17/2022 06:28   DG CHEST PORT 1 VIEW  Result Date: 02/16/2022 CLINICAL DATA:  Follow-up chest tube pleural effusion. EXAM: PORTABLE CHEST 1 VIEW COMPARISON:  Portable chest yesterday at 5:03 p.m. FINDINGS: 5:06 a.m. 02/16/2022. ETT tip 4.0 cm from the carina. Right IJ line tip again in the distal SVC. NGT is well inside the stomach but neither the side-hole or tip are filmed. Basolateral right chest pigtail tube thoracostomy is again noted. There was a minimally small adjacent basolateral pneumothorax on yesterday's film which is not seen today. There is increased hazy consolidation in the right base which could be pneumonia or re-expansion edema. Faint patchy hazy opacities in the left lower lung field are less dense than yesterday with minimal left pleural fluid remaining. No significant right pleural fluid is seen. The mid and upper lungs mildly emphysematous and clear. The mediastinum is stable with aortic tortuosity and atherosclerosis. Osteopenia. IMPRESSION: 1. Chest tube in place with no visible pneumothorax. 2. Other support devices are unaltered. 3. Increased opacity in the right base which could be pneumonitis or re-expansion edema. 4. Improving opacities in the left lower lung field, minimal left pleural effusion. Electronically Signed   By: Telford Nab M.D.   On: 02/16/2022 07:13   DG CHEST PORT 1 VIEW  Result Date: 02/15/2022 CLINICAL DATA:  Chest tube placement. EXAM: PORTABLE CHEST 1 VIEW COMPARISON:  02/15/2021 and prior radiographs FINDINGS: A RIGHT thoracostomy tube is now noted with pigtail tip overlying the LOWER LATERAL RIGHT hemithorax. Near complete resolution of RIGHT pleural effusion noted with tiny RIGHT basilar pneumothorax. New airspace  opacities versus atelectasis in the mid and LOWER LEFT lung noted. A trace LEFT pleural effusion is present. RIGHT IJ central venous catheter with tip overlying the mid SVC, endotracheal tube with tip 3.5 cm above the carina and NG tube entering the stomach again noted. IMPRESSION: 1. RIGHT thoracostomy tube placement with near complete resolution of RIGHT pleural effusion. Tiny RIGHT basilar pneumothorax. 2. New airspace opacities/atelectasis in the mid and LOWER LEFT lung with trace LEFT pleural effusion. Electronically Signed   By: Margarette Canada M.D.   On: 02/15/2022 17:21   DG CHEST PORT 1 VIEW  Result Date: 02/15/2022 CLINICAL DATA:  Acute respiratory failure. Endotracheally intubated. Status post left thoracentesis. EXAM: PORTABLE CHEST 1 VIEW COMPARISON:  Prior today FINDINGS: Support lines and tubes remain in appropriate position. Small left pleural effusion has nearly completely resolved since previous study. No pneumothorax visualized. Moderate right pleural effusion shows no significant change. Heart size is stable. Diffuse interstitial infiltrates are seen, consistent with interstitial edema. IMPRESSION: Near complete resolution of left pleural effusion. No pneumothorax visualized. Stable moderate right pleural effusion, and diffuse interstitial edema. Electronically Signed   By: Marlaine Hind M.D.   On: 02/15/2022 14:03   ECHOCARDIOGRAM COMPLETE  Result Date: 02/15/2022    ECHOCARDIOGRAM REPORT   Patient Name:   [name] Date of Exam: 02/15/2022 Medical Rec #:  [id number]   Height:        65.0 in Accession #:    [id number]  Weight:       117.9 lb Date of Birth:  March 08, 1961   BSA:          1.580 m Patient Age:    61 years    BP:           121/82 mmHg Patient Gender: F           HR:           82 bpm. Exam Location:  Inpatient Procedure: 2D Echo STAT ECHO Indications:    dyspnea  History:        Patient has no prior history of Echocardiogram examinations.                 Sepsis; Risk Factors:Hypertension and Current Smoker.  Sonographer:    [place] Referring Phys: [id number] Hortencia Conradi MEIER IMPRESSIONS  1. Left ventricular ejection fraction, by estimation, is >75%. The left ventricle has hyperdynamic function. The left ventricle has no regional wall motion abnormalities. Left ventricular diastolic parameters are consistent with Grade II diastolic dysfunction (pseudonormalization). Elevated left atrial pressure.  2. Right ventricular systolic function is mildly reduced. The right ventricular size is normal. Tricuspid regurgitation signal is inadequate for assessing PA pressure.  3. A small pericardial effusion is present. Large pleural effusion in the left lateral region.  4. The mitral valve is normal in structure. No evidence of mitral valve regurgitation.  5. The aortic valve is tricuspid. There is mild thickening of the aortic valve. Aortic valve regurgitation is not visualized. Aortic valve sclerosis is present, with no evidence of aortic valve stenosis.  6. The inferior vena cava is dilated in size with <50% respiratory variability, suggesting right atrial pressure of 15 mmHg. FINDINGS  Left Ventricle: Left ventricular ejection fraction, by estimation, is >75%. The left ventricle has hyperdynamic function. The left ventricle has no regional wall motion abnormalities. The left ventricular internal cavity size was normal in size. There is no left ventricular hypertrophy. Left ventricular diastolic parameters are consistent with Grade  II diastolic dysfunction (pseudonormalization).  Elevated left atrial pressure. Right Ventricle: The right ventricular size is normal. No increase in right ventricular wall thickness. Right ventricular systolic function is mildly reduced. Tricuspid regurgitation signal is inadequate for assessing PA pressure. Left Atrium: Left atrial size was normal in size. Right Atrium: Right atrial size was normal in size. Pericardium: A small pericardial effusion is present. Mitral Valve: The mitral valve is normal in structure. No evidence of mitral valve regurgitation. Tricuspid Valve: The tricuspid valve is normal in structure. Tricuspid valve regurgitation is not demonstrated. Aortic Valve: The aortic valve is tricuspid. There is mild thickening of the aortic valve. Aortic valve regurgitation is not visualized. Aortic valve sclerosis is present, with no evidence of aortic valve stenosis. Pulmonic Valve: The pulmonic valve was normal in structure. Pulmonic valve regurgitation is trivial. Aorta: The aortic root and ascending aorta are structurally normal, with no evidence of dilitation. Venous: The inferior vena cava is dilated in size with less than 50% respiratory variability, suggesting right atrial pressure of 15 mmHg. IAS/Shunts: No atrial level shunt detected by color flow Doppler. Additional Comments: There is a large pleural effusion in the left lateral region.  LEFT VENTRICLE PLAX 2D LVIDd:         3.20 cm   Diastology LVIDs:         1.80 cm   LV e' medial:    5.10 cm/s LV PW:         0.90 cm   LV E/e' medial:  19.2 LV IVS:        0.80 cm   LV e' lateral:   4.80 cm/s LVOT diam:     2.00 cm   LV E/e' lateral: 20.4 LV SV:         63 LV SV Index:   40 LVOT Area:     3.14 cm  RIGHT VENTRICLE            IVC RV S prime:     9.15 cm/s  IVC diam: 2.20 cm TAPSE (M-mode): 1.2 cm LEFT ATRIUM           Index        RIGHT ATRIUM           Index LA diam:      2.00 cm 1.27 cm/m   RA Area:     10.20 cm LA Vol (A4C): 18.7 ml 11.83 ml/m  RA Volume:   21.30 ml  13.48 ml/m  AORTIC  VALVE LVOT Vmax:   111.00 cm/s LVOT Vmean:  71.300 cm/s LVOT VTI:    0.200 m  AORTA Ao Root diam: 3.10 cm Ao Asc diam:  2.70 cm MITRAL VALVE MV Area (PHT): 2.80 cm     SHUNTS MV Decel Time: 271 msec     Systemic VTI:  0.20 m MV E velocity: 98.00 cm/s   Systemic Diam: 2.00 cm MV A velocity: 104.00 cm/s MV E/A ratio:  0.94 Mihai Croitoru MD Electronically signed by Sanda Klein MD Signature Date/Time: 02/15/2022/9:36:44 AM    Final    DG CHEST PORT 1 VIEW  Result Date: 02/15/2022 CLINICAL DATA:  61 year old female central line placement. Large pleural effusions. EXAM: PORTABLE CHEST 1 VIEW COMPARISON:  CTA chest yesterday. FINDINGS: Portable AP semi upright view at 0513 hours. Right IJ approach central line placed. Tip is just below the carina at the lower SVC level. Endotracheal tube tip in good position between the clavicles and carina. Enteric tube courses to the abdomen. No  pneumothorax. Left greater than right veiling pleural effusions, visible mediastinal contours, and overall lung ventilation appears stable from yesterday. IMPRESSION: 1. Right IJ central line placed with no adverse features. 2. Otherwise satisfactory lines and tubes. 3. Stable ventilation with left greater than right pleural effusions. Electronically Signed   By: Genevie Ann M.D.   On: 02/15/2022 05:48   [place] EKG SITE RITE  Result Date: 02/15/2022 If Site Rite image not attached, placement could not be confirmed due to current cardiac rhythm.  CT ABDOMEN PELVIS W CONTRAST  Result Date: 02/26/2022 CLINICAL DATA:  Chronic lymphocytic leukemia, history lymphoma with increasing lower extremity swelling, weight loss. Called EMS initially for abdominal pain and became unresponsive with EMS. EXAM: CT ABDOMEN AND PELVIS WITH CONTRAST TECHNIQUE: Multidetector CT imaging of the abdomen and pelvis was performed using the standard protocol following bolus administration of intravenous contrast. RADIATION DOSE REDUCTION: This exam was performed  according to the departmental dose-optimization program which includes automated exposure control, adjustment of the mA and/or kV according to patient size and/or use of iterative reconstruction technique. CONTRAST:  163mL OMNIPAQUE IOHEXOL 350 MG/ML SOLN COMPARISON:  CT abdomen and pelvis with contrast 01/06/2020 and 06/25/2016, also CTA chest earlier today. FINDINGS: Lower chest: Large bilateral pleural effusions are again noted, on the left depressing the diaphragm and slightly shifting the mediastinum to the right with compressive collapse/consolidation of the lower lobes and portions of the upper and right middle lobes. The cardiac size is normal. Multiple bulky masses in the low axillary regions on the left-greater-than-right are again shown. Largest visible mass on the left is 5.5 x 3.9 cm largest visible mass on the right is 3.9 x 2.3 cm. There are enlarged subcarinal and right hilar nodes partially visible mildly prominent left hilar nodes. There are multiple enlarged retrocrural lymph nodes largest of these is to the right measuring 2.5 x 1.2 cm (series 5 axial 26). Hepatobiliary: Status post cholecystectomy. No biliary dilatation is seen. There is intrahepatic periportal edema which is probably either due to fluid overload or hepatic dysfunction in this case given the interval marked weight loss. There is chronic 1.3 cm subcapsular hypodensity in the left lobe of the liver along side the gallbladder fossa unchanged and probably a small hemangioma or complex cyst. In the anterior segment of the right lobe of the liver on 5:44 there is a small flash filling hemangioma which was seen previously and unchanged. No hepatic mass enhancement is seen. The portal vein is patent within normal caliber limits. Pancreas: there are scattered coarse chronic calcifications in the pancreas consistent with chronic calcific pancreatitis but no findings of acute pancreatitis, mass or ductal dilatation. Spleen:There is  heterogeneous splenic enhancement. There could be multiple small hypodense lesions within the spleen or the heterogeneity could be due to bolus phase phenomenon but the spleen is not enlarged for size. Adrenals/Urinary Tract: No adrenal or renal cortical mass enhancement. There is a 1.5 cm cyst of the posterior lower right kidney additional scattered bilateral subcentimeter hypodensities which are too small to characterize. There is no urinary stone or obstruction. The bladder is catheterized contracted and not well seen. Stomach/Bowel: NGT is coiled in the stomach with the tip in the body of the stomach. There are thickened folds in the stomach and small bowel no small bowel obstruction or gross inflammatory reaction. An appendix is not seen. There is moderate stool retention transverse and descending colon. Thickening of the ascending colon is noted versus underdistention. Vascular/Lymphatic: There is moderate to  heavy aortoiliac calcification without aneurysm. The portal vein splenic vein and SMV are patent. There is extensive retroperitoneal adenopathy including in the periportal portacaval and retrocaval spaces with portacaval nodes up to 1.6 cm in short axis periportal nodes up to 1.8 cm in short axis and large confluent mantle of adenopathy extending along the retroperitoneum into in the kidneys lifting the aorta anteriorly encasing the aorta and IVC and continuing inferiorly along the pelvic sidewalls with additional bulky adenopathy in both sidewalls and both inguinal areas. The mantle of adenopathy extends from the level of the SMA inferior to the aortic bifurcation up to 15 cm in length and up to at least 10 cm coronal and 4.7 cm AP on 5:45. There is additional presacral coccygeal confluent adenopathy which is less well-defined but measures approximately 10 by 4 cm on 5:69. Bilateral bulky pelvic sidewall lymph nodes largest in the external chains are noted largest right external iliac chain node is 4.6 x  3.4 cm on 5:74 largest on the left 5.6 by 3.7 cm on 5:73 encasement of internal and external iliac vasculature without significant vascular narrowing. There is similar bulky bilateral inguinal chain adenopathy. Reproductive: The uterus is intact but not well seen. The ovaries are obscured by overlapping structures and adenopathy. Other: There is a small volume of abdominal and pelvic free ascites. No free air, hemorrhage or abscess is seen. There is diffuse mesenteric haziness and body wall anasarca. Interval weight loss consistent with cachexia. Musculoskeletal: There are degenerative changes in the lumbar spine but no destructive osseous process. IMPRESSION: 1. Extensive abdominopelvic and inguinal adenopathy, most likely related to lymphoma/leukemia. 2. Numerous small hypodense splenic lesions versus bolus phase phenomenon. 3. Interval weight loss with very little body fat, appearance of cachexia, diffuse anasarca and small-volume abdominal and pelvic ascites. 4. Intrahepatic periportal edema which is probably either due to fluid overload or hepatic dysfunction. The portal vein is patent within normal caliber limits. 5. Chronic calcific pancreatitis but no findings of acute pancreatitis or ductal dilatation. 6. Ascending colitis versus nondistention, remainder of the colon with constipation. 7. Diffuse small intestinal wall thickening with gastric fold thickening, could relate to gastroenteritis or hepatic dysfunction versus reactive from ascites versus congestive or infiltrating disease. 8. Large low axillary masses, with subcarinal and hilar adenopathy. 9. Large pleural effusions. 10. Aortic atherosclerosis. Electronically Signed   By: Telford Nab M.D.   On: 03/03/2022 21:54   CT Head Wo Contrast  Result Date: 03/05/2022 CLINICAL DATA:  Mental status changes, unknown cause. EXAM: CT HEAD WITHOUT CONTRAST TECHNIQUE: Contiguous axial images were obtained from the base of the skull through the vertex without  intravenous contrast. RADIATION DOSE REDUCTION: This exam was performed according to the departmental dose-optimization program which includes automated exposure control, adjustment of the mA and/or kV according to patient size and/or use of iterative reconstruction technique. COMPARISON:  MRI brain 02/01/2004 report. FINDINGS: Brain: There is slight cerebral cortical atrophy with mild-to-moderate small-vessel disease of the cerebral white matter, with white matter changes only minimally progressed from 18 years ago. 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 There is a mostly calcified extra-axial parasagittal high left frontal convexity meningioma measuring 1.8 x 1.5 x 1.3 cm, previously 1.5 x 0.9 cm, mildly deforming an underlying parasagittal superior left frontal gyrus but without underlying vasogenic edema. There are aneurysm coils with interval coiling of the previously noted left MCA bifurcation  aneurysm. This causes moderate streak artifact through the adjacent temporal lobes and upper posterior fossa structures. Allowing for metallic artifacts there are no findings seen suspicious for acute cortical based infarct, hemorrhage, mass effect or midline shift. The cerebellum and brainstem are unremarkable. The ventricles are normal in size and position. Basal cisterns are clear. Vascular: There are scattered calcifications of the carotid siphons but no hyperdense central vasculature. Skull: No fracture or focal lesion. Sinuses/Orbits: There is patchy membrane thickening in the ethmoids. There is a 1 cm osteoma in the left frontal sinus. Other visualized sinuses are clear. No mastoid effusion. Other: None. IMPRESSION: 1. 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 Allowing for streak artifacts no acute intracranial process is suspected. 2. 1.8 x 1.5 x 1.3 cm mostly calcified meningioma in  the parasagittal high left frontal convexity mildly deforming and underlying parasagittal superior frontal gyrus. This appears to have grown only minimally since 18 years ago. 3. Slight atrophy with mild-to-moderate small-vessel disease with only minimal progression of the small-vessel changes , no increased atrophy in the interval. 4. 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 5. Sinus membrane disease. Electronically Signed   By: Telford Nab M.D.   On: 03/12/2022 21:21   CT Angio Chest PE W and/or Wo Contrast  Result Date: 03/10/2022 CLINICAL DATA:  Unresponsive EXAM: CT ANGIOGRAPHY CHEST WITH CONTRAST TECHNIQUE: Multidetector CT imaging of the chest was performed using the standard protocol during bolus administration of intravenous contrast. Multiplanar CT image reconstructions and MIPs were obtained to evaluate the vascular anatomy. RADIATION DOSE REDUCTION: This exam was performed according to the departmental dose-optimization program which includes automated exposure control, adjustment of the mA and/or kV according to patient size and/or use of iterative reconstruction technique. CONTRAST:  122mL OMNIPAQUE IOHEXOL 350 MG/ML SOLN COMPARISON:  CT 01/06/2020, chest x-ray 03/03/2022, CT chest 02/14/2016 FINDINGS: Cardiovascular: Satisfactory opacification of the pulmonary arteries to the segmental level. No evidence of pulmonary embolism. Aorta is nonaneurysmal. Mild atherosclerosis. No dissection. Normal cardiac size. No significant pericardial effusion. Mediastinum/Nodes: Midline trachea. Endotracheal tube tip several cm above the carina. Esophageal tube tip below the diaphragm but incompletely visualized. Numerous enlarged supraclavicular and axillary nodes, poorly defined due to lack of subcutaneous fat. Negative for thyroid mass. Ill-defined soft tissue density in the mediastinum, may reflect combination of fluid and adenopathy. Lungs/Pleura: Large bilateral pleural  effusions with probable passive atelectasis of the lungs. No visible pneumothorax Upper Abdomen: Findings are dictated separately. Musculoskeletal: Sternum is intact. No definite acute osseous abnormality. Anasarca. Multiple large masses within the low axillary regions bilaterally, extending into the upper outer quadrant of the left breast. When compared to 2017, mostly fatty breast tissue was noted previously, today the breasts are diffusely infiltrated with fluid or soft tissue. This appears worse on the left side. Review of the MIP images confirms the above findings. IMPRESSION: 1. Negative for acute pulmonary embolus. 2. Large bilateral pleural effusions with probable passive atelectasis in the lungs. 3. Lack of subcutaneous fat limits the exam. Extensive supraclavicular and lower neck adenopathy. Bilateral axillary adenopathy. Multiple large left greater than right axillary/chest wall masses, extending into the upper outer quadrants of the breasts, presumably representing adenopathy however the breasts today appear diffusely infiltrated with either edema or soft tissue/mass, recommend correlation with physical exam. Collectively the findings are suspect for lymphoma or metastatic disease. 4. Anasarca Aortic Atherosclerosis (ICD10-I70.0). Electronically Signed   By: Donavan Foil M.D.   On: 03/03/2022  21:11   DG Chest Portable 1 View  Result Date: 02/13/2022 CLINICAL DATA:  Altered mental status EXAM: PORTABLE CHEST 1 VIEW COMPARISON:  01/09/2015 FINDINGS: Tip of endotracheal tube is 4.8 cm above the carina. Distal portion of enteric tube is seen in the stomach. There is moderate to large left pleural effusion some of which appears to be loculated along the lateral margin of left upper and left mid lung fields. Evaluation of left lower lung fields for infiltrates is limited by the effusion. There is small right pleural effusion. Linear density in the right mid lung fields may suggest fluid in the interlobar  fissure or subsegmental atelectasis. Central pulmonary vessels are more prominent. IMPRESSION: Central pulmonary vessels are more prominent suggesting CHF. Moderate to large left pleural effusion. Small right pleural effusion. Electronically Signed   By: Elmer Picker M.D.   On: 03/01/2022 18:55       IMPRESSION/PLAN: 1. 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 Dr. Lisbeth Renshaw has reviewed the patient's imaging and disease course to date. We discussed her case yesterday with medical oncology and despite Dr. Libby Maw attempts at pulse dosed steroids, her symptoms and findings persist. Dr. Lisbeth Renshaw would consider a palliative dose of radiotherapy to the mediastinum as this histology is expected to be radiosensitive. We discussed the risks, benefits, short, and long term effects of radiotherapy, as well as the curative intent, and the patient is interested in proceeding. Dr. Lisbeth Renshaw discusses the delivery and logistics of radiotherapy and anticipates a course of 2 weeks of radiotherapy to the mediastinum. Written consent is obtained and placed in the chart, a copy was provided to the patient. She will simulate this morning and start treatment today. Her son and daughter in law were able to join [place] by phone.   In a visit lasting 75 minutes, greater than 50% of the time was spent face to face and in floor time discussing the patient's condition, in preparation for the discussion, and coordinating the patient's care.      Carola Rhine, [facility]   **Disclaimer: This note was dictated with voice recognition software. Similar sounding words can inadvertently be transcribed and this note may contain transcription errors which may not have been corrected upon publication of note.**

## 2022-02-27 NOTE — Progress Notes (Addendum)
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  Assessment: 61 year old female with pertinent PMH of CLL, depression, anxiety, HLD, HTN presents to [facility] ED on 6/2 unresponsive.  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 Patient also has bilateral chylothorax with high output.  Pharmacy has been consulted for TPN.  Glucose / Insulin: on sSSI q4h, semglee 8 units daily - used 13 units of SSI since 6p on 6/14 - CBGs (goal <150): most wnl (only one reading in the 200s) - on prednisone 50 mg daily  Electrolytes: phos low 1.9 (TPN off since 2300 on 6/15 for transport to [facility]); other lytes wnl including CorrCa Renal: Scr < 1, BUN slightly elevated at 28 Hepatic: LFTs wnl - TG 90 - albumin low 2.2 Intake / Output: +425 ml - Chest tube: 700 ml - UOP: 0.2 mh/kg/hr MIVF:  dextrose 100 ml/hr  GI Imaging: 6/10 CT Chest Bilateral large pleural effusions and bulky axillary and lower cervical lymphadenopathy  GI Surgeries / Procedures: None this admission  Central access: PICC to be placed 6/13 TPN start date: 6/13  Nutritional Goals: Goal TPN rate is 100 mL/hr (provides 150 g of protein and 2357 kcals per day)  RD Assessment: Estimated Needs Total Energy Estimated Needs: 2300-2600 Total Protein Estimated Needs: 140-160 grams Total Fluid Estimated Needs: >2.5 L  Current Nutrition:  - NPO  - on TPN  Significant Events:  - 6/14: TPN turned off at ~10:30p for transport to [facility]. D10 started while off TPN  Plan:   Now: - sodium phosphate 15 mmol IV x1  At 1800:  - resume TPN at 100 mL/hr at 1800 and d/c D10W - Electrolytes in TPN:  Na 50 mEq/L, K  27 mEq/L Ca 5 mEq/L Mg 5 mEq/L Phos 15 mmol/L Cl:Ac 1:1 - Add standard MVI and trace elements to  TPN - Continue Sensitive q4h SSI and adjust as needed - Monitor TPN labs on Mon/Thurs - bmet, mag and phos on 6/16  Dia Sitter, PharmD, BCPS 02/27/2022 8:07 AM

## 2022-02-27 NOTE — Progress Notes (Signed)
PROGRESS NOTE [name]  OIB:[id number] DOB: May 23, 1961 DOA: 03/11/2022 PCP: Noreene Larsson, NP   Brief Narrative/Hospital Course:  52 yof  w/ CLL, depression, anxiety, HLD, HTN presented to [facility] ED on 6/2 unresponsive. On 6/2 patient called EMS for abdominal pain.  Patient initially was alert and talking but became unresponsive with AMS, hypoxic with agonal breathing and was placed on NRB, was noted to be hypotensive.In ED at [facility] remained unresponsive with agonal respirations and was intubated for airway protection and hypoxia.BP initially 81/65.  Given IV fluids.  Started on levo.  Fever 100.9 F and WBC 104.4.  Started on cefepime, Flagyl, Vanco.  Cultures pending.  Glucose 198, BNP 534, troponin 109 then 112, LA 4.4 then 1.8, UA unremarkable, UDS negative.  CXR with large left pleural effusion and small right pleural effusion.  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  CT abdomen with ascending colitis constipation, small intestine wall thickening possibly related to gastroenteritis; small volume ascites; extensive abdominopelvic and inguinal adenopathy.  PCCM consulted for ICU admission and transferred to [facility]. 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  Has been off pressors and subsequently transferred to [facility] service 6/6 Her encephalopathy was probably due to hypercarbia followed by oncology PCCM. 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  Chest tube continues to have a chylothorax managed by pulmonary  She has had recurrent chylothorax with ongoing chest tube and patient transferred to [facility] long 02/26/22 to start radiation treatment to see whether this will help with lymphadenopathy from CLL.   Subjective: Seen this am, on RA eating breakfast. Patient family on the phone talking with radiation oncology team Overnight no fever Continues to have persistent leukocytosis Chest tube with chylothorax-output 700 cc/past 24 hr  Assessment and Plan: Principal Problem:   Acute respiratory failure with hypoxia ([place]) Active Problems:   Sepsis ([place] )   CLL (chronic lymphocytic leukemia) ([place])   Shock ([place])   Chronic diastolic heart failure ([place])   DNR (do not resuscitate)   Protein-calorie malnutrition, severe   Pressure injury of skin   Bilateral chylothorax   Small cell B-cell lymphoma (HCC)   Leukocytosis   Acute respiratory failure with hypoxia-RESOLVED  Bilateral chylothorax: With recurrent chylothorax, chest tube remains in place.  PCCM managing on octreotide increased to 50 mcg 3 times daily  6/14 in an attempt to slow chyle production.  Sepsis/Shock/Hypotension poa- resolved.  S/p 5 days of antibiotic, no clear source was identified   CLL/SLL Significant leukocytosis: On prednisone 50 mg daily, transfer to [place] to consider XRT, rituximab is being considered, followed and managed by oncology team appreciate input.  Has persistent leukocytosis in the setting of CLL and also steroid.  No fever  Chronic diastolic heart failure: Currently compensated. Type 2 diabetes mellitus, new onset A1c 6.5, uncontrolled hyperglycemia more than 200 and also hypoglycemia 64 on 6/14 pm: on d10 from 6/14 night. Brittle DM, currently on high-dose steroid stop Semglee, keep on SSI 0 to 9 units  Recent Labs  Lab 02/26/22 2009 02/26/22 2317 02/26/22 2342 02/27/22 0331 02/27/22 0720   GLUCAP 270* 64* 78 214* 106*    Protein-calorie malnutrition, severe: Augment nutritional status supplement Nutrition Status: Nutrition Problem: Severe Malnutrition Etiology: chronic illness (CLL) Signs/Symptoms: severe muscle depletion, severe fat depletion Interventions: TPN  Deconditioning/debility need PT OT once chest tube is out  DVT prophylaxis: enoxaparin (LOVENOX) injection 40 mg Start: 02/16/22 1515 Code Status:   Code Status: DNR Family Communication: plan of care discussed with patient/family on phone Patient status is: inpatient because of need for xrt and CLL management. Level of care: Med-Surg   Dispo: The patient is from: Home, independent.            Anticipated disposition: TBD Mobility Assessment (last 72 hours)     Mobility Assessment     Row Name 02/26/22 2347 02/26/22 2033 02/26/22 0800 02/25/22 1941 02/25/22 0945   Does patient have an order for bedrest or is patient medically unstable No - Continue assessment No - Continue assessment No - Continue assessment No - Continue assessment No - Continue assessment   What is the highest level of mobility based on the progressive mobility assessment? Level 3 (Stands with assist) - Balance while standing  and cannot march in place Level 3 (Stands with assist) - Balance while standing  and cannot march in place Level 3 (Stands with assist) - Balance while standing  and cannot march in place Level 3 (Stands with assist) - Balance while standing  and cannot march in place Level 3 (Stands with assist) - Balance while standing  and cannot march in place   Is the above level different from baseline mobility prior to current illness? Yes - Recommend PT order Yes - Recommend PT order Yes - Recommend PT order Yes - Recommend PT order Yes - Recommend PT order    [place] Name 02/17/2022 2300 03/01/2022 1120         Does patient have an order for bedrest or is patient medically unstable No - Continue assessment No - Continue assessment       What is the highest level of mobility based on the progressive mobility assessment? Level 3 (Stands with assist) - Balance while standing  and cannot march in place Level 3 (Stands with assist) - Balance while standing  and cannot march in place      Is the above level different from baseline mobility prior to current illness? Yes - Recommend PT order --                Objective: Vitals last 24 hrs: Vitals:   02/26/22 2010 02/26/22 2314 02/27/22 0328 02/27/22 0715  BP: 127/85 118/79 123/90 120/81  Pulse: (!) 108 95 95 98  Resp: 20 (!) _0 Temp: 99.5 F (37.5 C) 98.2 F (36.8 C) 98.1 F (36.7 C) 98.7 F (37.1 C)  TempSrc: Oral Oral Oral Oral  SpO2: 98% 97% 96% 98%  Weight:  50.6 kg    Height:       Weight change:   Physical Examination: General exam: alert awake,thin,older than stated age, weak appearing. HEENT:Oral mucosa moist, Ear/Nose WNL grossly, dentition normal. Respiratory system: bilaterallyclear BS, no use of accessory muscle, chest tube in place on rt lower chest post. Cardiovascular system: S1 & S2 +, No JVD. Gastrointestinal system: Abdomen soft,NT,ND, BS+ Nervous System:Alert, awake, moving extremities and grossly nonfocal Extremities: LE edema CHRONIC APPEARING w/ hyperpigmentation,distal peripheral pulses palpable.  Skin: No rashes,no icterus.  MSK: Normal muscle bulk,tone, power  Medications reviewed: Scheduled Meds:  chlorhexidine gluconate (MEDLINE KIT)  15 mL Mouth Rinse BID   Chlorhexidine Gluconate Cloth  6 each Topical Daily   docusate sodium  100 mg Oral BID   enoxaparin (LOVENOX) injection  40 mg Subcutaneous Q24H   insulin aspart  0-9 Units Subcutaneous Q4H   insulin glargine-yfgn  8 Units Subcutaneous Daily   octreotide  50 mcg Subcutaneous TID   polyethylene glycol  17 g Oral Daily   predniSONE  50 mg Oral Q breakfast   sodium chloride flush  10 mL Other Q8H   sodium chloride flush  10-40 mL Intracatheter Q12H   Continuous  Infusions:  sodium chloride 10 mL/hr at 02/27/22 0309   dextrose 100 mL/hr at 02/27/22 0309   sodium phosphate 15 mmol in dextrose 5 % 250 mL infusion     TPN ADULT (ION)      Pressure Injury 02/17/22 Sacrum Medial Stage 1 -  Intact skin with non-blanchable redness of a localized area usually over a bony prominence. (Active)  02/17/22 0745  Location: Sacrum  Location Orientation: Medial  Staging: Stage 1 -  Intact skin with non-blanchable redness of a localized area usually over a bony prominence.  Wound Description (Comments):   Present on Admission: No  Dressing Type Foam - Lift dressing to assess site every shift 02/26/22 2347   Diet Order             Diet regular Room service appropriate? Yes; Fluid consistency: Thin  Diet effective now                    Nutrition Problem: Severe Malnutrition Etiology: chronic illness (CLL) Signs/Symptoms: severe muscle depletion, severe fat depletion Interventions: TPN   Intake/Output Summary (Last 24 hours) at 02/27/2022 0958 Last data filed at 02/27/2022 9201 Gross per 24 hour  Intake 1565.11 ml  Output 900 ml  Net 665.11 ml   Net IO Since Admission: -6,246.97 mL [02/27/22 0958]  Wt Readings from Last 3 Encounters:  02/26/22 50.6 kg  03/08/20 64.4 kg  02/07/20 68.5 kg     Unresulted Labs (From admission, onward)     Start     Ordered   02/28/22 0071  Basic metabolic panel  Tomorrow morning,   R       Question:  Specimen collection method  Answer:  IV Team=IV Team collect   02/27/22 0844   02/28/22 0500  Magnesium  Tomorrow morning,   R       Question:  Specimen collection method  Answer:  IV Team=IV Team collect   02/27/22 0844   02/28/22 0500  Phosphorus  Tomorrow morning,   R       Question:  Specimen collection method  Answer:  IV Team=IV Team collect   02/27/22 0844   02/27/22 2197  Pathologist smear review  Once,   AD        02/27/22 0613   02/27/22 0613  Triglycerides  Once,   TIMED        02/27/22 5883    02/27/22 0500  Comprehensive metabolic panel  (TPN Lab Panel)  Every Mon,Thu (0500),   R     Question:  Specimen collection method  Answer:  Unit=Unit collect   02/25/22 1228   02/27/22 0500  Magnesium  (TPN Lab Panel)  Every Mon,Thu (0500),   R     Question:  Specimen collection method  Answer:  Unit=Unit collect  02/25/22 1228   02/27/22 0500  Phosphorus  (TPN Lab Panel)  Every Mon,Thu (0500),   R     Question:  Specimen collection method  Answer:  Unit=Unit collect   02/25/22 1228   02/27/22 0500  Triglycerides  (TPN Lab Panel)  Every Mon,Thu (0500),   R     Question:  Specimen collection method  Answer:  Unit=Unit collect   02/25/22 1228   02/25/22 1700  Vitamin A  Once,   R       Question:  Specimen collection method  Answer:  Unit=Unit collect   02/25/22 1343   02/25/22 1700  Zinc  Once,   R       Question:  Specimen collection method  Answer:  Unit=Unit collect   02/25/22 1343   02/25/2022 1600  IgVH Somatic Hypermutation  Once,   R        02/19/2022 1600   03/06/2022 1442  Acid Fast Culture with reflexed sensitivities  (AFB smear + Culture w reflexed sensitivities panel)  Once,   R       See Hyperspace for full Linked Orders Report.   02/22/2022 1441          Data Reviewed: I have personally reviewed following labs and imaging studies CBC: Recent Labs  Lab 02/23/22 0031 03/10/2022 0413 02/25/22 0056 02/26/22 0950 02/27/22 0613  WBC 104.7* 106.3* 92.1* 86.8* 98.2*  NEUTROABS 5.2 5.4 7.0 6.1 7.1  HGB 13.0 12.3 11.0* 11.2* 12.3  HCT 40.4 39.5 36.3 35.8* 39.6  MCV 91.0 91.6 93.3 93.7 91.7  PLT 403* 498* 474* 508* 163*   Basic Metabolic Panel: Recent Labs  Lab 02/21/22 0710 02/22/22 0135 02/23/22 0031 02/13/2022 0413 02/25/22 0056 02/26/22 0341 02/27/22 0613  NA 138   < > 135 138 137 138 137  K 5.2*   < > 5.3* 4.8 5.1 4.7 4.7  CL 103   < > 100 101 102 101 103  CO2 28   < > _0 33* 30  GLUCOSE 93   < > 233* 144* 148* 128* 93  BUN 22   < > _1 28*   CREATININE 0.55   < > 0.50 0.41* 0.43* 0.45 0.49  CALCIUM 8.0*   < > 8.1* 8.0* 7.8* 8.1* 8.1*  MG 1.9  --   --   --  1.9 2.0 2.0  PHOS  --   --   --   --   --  3.2 1.9*   < > = values in this interval not displayed.   GFR: Estimated Creatinine Clearance: 59 mL/min (by C-G formula based on SCr of 0.49 mg/dL). Liver Function Tests: Recent Labs  Lab 02/21/22 0710 02/26/22 0341 02/27/22 0613  AST 27 36 33  ALT 20 39 35  ALKPHOS 77 69 71  BILITOT 0.1* 0.3 0.3  PROT 4.5* 4.6* 4.9*  ALBUMIN 1.8* 2.0* 2.2*   No results for input(s): "LIPASE", "AMYLASE" in the last 168 hours. No results for input(s): "AMMONIA" in the last 168 hours. Coagulation Profile: No results for input(s): "INR", "PROTIME" in the last 168 hours. BNP (last 3 results) No results for input(s): "PROBNP" in the last 8760 hours. HbA1C: No results for input(s): "HGBA1C" in the last 72 hours. CBG: Recent Labs  Lab 02/26/22 2009 02/26/22 2317 02/26/22 2342 02/27/22 0331 02/27/22 0720  GLUCAP 270* 64* 78 214* 106*   Lipid Profile: Recent Labs    02/26/22 0341  TRIG 90  Thyroid Function Tests: No results for input(s): "TSH", "T4TOTAL", "FREET4", "T3FREE", "THYROIDAB" in the last 72 hours. Sepsis Labs: No results for input(s): "PROCALCITON", "LATICACIDVEN" in the last 168 hours.  Recent Results (from the past 240 hour(s))  Body fluid culture w Gram Stain     Status: None   Collection Time: 03/12/2022  2:35 PM   Specimen: Pleural Fluid  Result Value Ref Range Status   Specimen Description PLEURAL  Final   Special Requests NONE  Final   Gram Stain   Final    FEW WBC PRESENT, PREDOMINANTLY PMN NO ORGANISMS SEEN    Culture   Final    NO GROWTH 3 DAYS Performed at [facility], [address], [place], [place] [address]    Report Status 02/24/2022 FINAL  Final  Fungus Culture With Stain     Status: None (Preliminary result)   Collection Time: 02/26/2022  2:35 PM   Specimen: Pleural Fluid  Result  Value Ref Range Status   Fungus Stain Final report  Final    Comment: (NOTE) Performed At: [facility], [place] [id number] Rush Farmer MD [id number]    Fungus (Mycology) Culture PENDING  Incomplete   Fungal Source PLEURAL  Final    Comment: Performed at [facility], [place] [address], [place], [place] [address]  Acid Fast Smear (AFB)     Status: None   Collection Time: 02/18/2022  2:35 PM   Specimen: Pleural, Left  Result Value Ref Range Status   AFB Specimen Processing Concentration  Final   Acid Fast Smear Negative  Final    Comment: (NOTE) Performed At: [facility] [place], [place] [id number] Rush Farmer MD [id number]    Source (AFB) PLEURAL  Final    Comment: Performed at [facility], [place] [address] [place],  [address]  Fungus Culture Result     Status: None   Collection Time: 02/26/2022  2:35 PM  Result Value Ref Range Status   Result 1 Comment  Final    Comment: (NOTE) KOH/Calcofluor preparation:  no fungus observed. Performed At: [facility] [place], [place] [id number] Rush Farmer MD [id number]     Antimicrobials: Anti-infectives (From admission, onward)    Start     Dose/Rate Route Frequency Ordered Stop   02/17/22 1600  metroNIDAZOLE (FLAGYL) IVPB 500 mg        500 mg 100 mL/hr over 60 Minutes Intravenous Every 12 hours 02/17/22 0713 02/18/22 1926   02/16/22 1600  ceFEPIme (MAXIPIME) 2 g in sodium chloride 0.9 % 100 mL IVPB        2 g 200 mL/hr over 30 Minutes Intravenous Every 12 hours 02/16/22 0818 02/18/22 1847   02/16/22 1500  metroNIDAZOLE (FLAGYL) IVPB 500 mg  Status:  Discontinued        500 mg 100 mL/hr over 60 Minutes Intravenous Every 12 hours 02/16/22 1404 02/17/22 0711   02/15/22 1800  vancomycin (VANCOREADY) IVPB 1250 mg/250 mL  Status:  Discontinued        1,250 mg 166.7 mL/hr over 90 Minutes Intravenous Every 24 hours 03/05/2022 1955  02/15/22 0737   02/15/22 1415  metroNIDAZOLE (FLAGYL) IVPB 500 mg  Status:  Discontinued        500 mg 100 mL/hr over 60 Minutes Intravenous Every 12 hours 02/15/22 1325 02/16/22 0840   02/15/22 0400  ceFEPIme (MAXIPIME) 2 g in sodium chloride 0.9 % 100 mL IVPB  Status:  Discontinued        2 g 200 mL/hr over 30 Minutes Intravenous Every 8 hours 02/28/2022 1955 02/16/22 0817   02/16/2022 2000  vancomycin (VANCOREADY) IVPB 1250 mg/250 mL  Status:  Discontinued        1,250 mg 166.7 mL/hr over 90 Minutes Intravenous Every 24 hours 03/02/2022 1952 03/05/2022 1955   03/05/2022 1945  ceFEPIme (MAXIPIME) 2 g in sodium chloride 0.9 % 100 mL IVPB        2 g 200 mL/hr over 30 Minutes Intravenous  Once 02/18/2022 1931 03/09/2022 2051   03/13/2022 1945  metroNIDAZOLE (FLAGYL) IVPB 500 mg        500 mg 100 mL/hr over 60 Minutes Intravenous  Once 02/27/2022 1931 02/26/2022 2053   03/05/2022 1945  vancomycin (VANCOCIN) IVPB 1000 mg/200 mL premix  Status:  Discontinued        1,000 mg 200 mL/hr over 60 Minutes Intravenous  Once 02/21/2022 1931 02/16/2022 2053      Culture/Microbiology    Component Value Date/Time   SDES PLEURAL 03/06/2022 1435   SPECREQUEST NONE 03/08/2022 1435   CULT  02/17/2022 1435    NO GROWTH 3 DAYS Performed at [facility], [place] [address], [place], [place] [address]    REPTSTATUS 03/11/2022 FINAL 03/13/2022 1435    Other culture-see note  Radiology Studies: DG Chest Port 1 View  Result Date: 02/27/2022 CLINICAL DATA:  Respiratory failure.  Chest tube. EXAM: PORTABLE CHEST 1 VIEW COMPARISON:  Portable chest yesterday at 5:50 a.m. FINDINGS: 5:07 a.m., 02/27/2022.  Patient is moderately rotated to the left. Pigtail of the pleural catheter is now left of the midline at the level of T12. No pneumothorax is seen. There is only a small right pleural effusion remaining and it appears improved. Right PICC tip remains at the superior cavoatrial junction. A large left pleural effusion again obscures  the lower 2/3 of the left chest. There is aortic atherosclerosis and tortuosity. The cardiac size is stable. Remaining lungs clear with COPD change. There are surgical clips in the right axilla. Osteopenia and degenerative change. IMPRESSION: 1. The pigtail of the pleural catheter is now left of the midline in the base of the chest, exact position unclear but there is only minimal right pleural fluid remaining today. 2. Large left pleural effusion obscuring the lower 2/3 of the left chest,, stable. 3. COPD, aortic atherosclerosis. Electronically Signed   By: Telford Nab M.D.   On: 02/27/2022 06:52   DG Chest Port 1 View  Result Date: 02/26/2022 CLINICAL DATA:  Pleural effusions. EXAM: PORTABLE CHEST 1 VIEW COMPARISON:  Chest x-ray 02/26/2022. FINDINGS: The right chest tube has changed position. It is now likely in the posterior inferior right pleural space but difficult to be certain without a lateral film. Small residual right pleural effusion and right basilar atelectasis. No pneumothorax. Persistent large left pleural effusion with overlying atelectasis. New right-sided PICC line is noted.  The tip is in the right atrium. IMPRESSION: 1. The right chest tube has changed position and is now likely in the posterior inferior right pleural space but difficult to be certain without a lateral film. 2. Persistent large left pleural effusion with overlying atelectasis. 3. New right PICC line with tip in the right atrium. Electronically Signed   By: Marijo Sanes M.D.   On: 02/26/2022 07:58   [place] EKG SITE RITE  Result Date: 02/25/2022 If Site Rite image not attached, placement could not be confirmed due  to current cardiac rhythm.    LOS: 13 days   Antonieta Pert, MD [facility]  02/27/2022, 9:58 AM

## 2022-02-27 NOTE — Progress Notes (Signed)
Was the fall witnessed: No  Patient condition before and after the fall: Alert and oriented prior to fall and after  Patient's reaction to the fall: Patient stated she was a "little shook up". No apparent injuries. Patient c/o back pain.  Name of the doctor that was notified including date and time: Dr. Antonieta Pert 02/27/22 at 1106  Any interventions and vital signs: Vital signs taken, provider at bedside, rapid response RN at bedside, CXR done, PRN Tylenol administered per patient request for back pain which was effective.  PICC line was dislodged with fall, no bleeding noted. New order for PICC in, placement pending.

## 2022-02-27 NOTE — Progress Notes (Signed)
Peripherally Inserted Central Catheter Placement  The IV Nurse has discussed with the patient and/or persons authorized to consent for the patient, the purpose of this procedure and the potential benefits and risks involved with this procedure.  The benefits include less needle sticks, lab draws from the catheter, and the patient may be discharged home with the catheter. Risks include, but not limited to, infection, bleeding, blood clot (thrombus formation), and puncture of an artery; nerve damage and irregular heartbeat and possibility to perform a PICC exchange if needed/ordered by physician.  Alternatives to this procedure were also discussed.  Bard Power PICC patient education guide, fact sheet on infection prevention and patient information card has been provided to patient /or left at bedside.    PICC Placement Documentation  PICC Double Lumen 02/27/22 Left Brachial 43 cm 1 cm (Active)  Indication for Insertion or Continuance of Line Administration of hyperosmolar/irritating solutions (i.e. TPN, Vancomycin, etc.) 02/27/22 1720  Exposed Catheter (cm) 1 cm 02/27/22 1720  Site Assessment Clean, Dry, Intact 02/27/22 1720  Lumen #1 Status Flushed;Saline locked;Blood return noted 02/27/22 1720  Lumen #2 Status Flushed;Saline locked;Blood return noted 02/27/22 1720  Dressing Type Transparent;Securing device 02/27/22 1720  Dressing Status Antimicrobial disc in place;Clean, Dry, Intact 02/27/22 1720  Dressing Intervention New dressing 02/27/22 1720  Dressing Change Due 03/06/22 02/27/22 [place] 02/27/2022, 5:24 PM

## 2022-02-27 NOTE — Progress Notes (Signed)
Date and time results received: 02/27/22 0725 (use smartphrase ".now" to insert current time)  Test: CBC Critical Value: WBC 98.2  Name of Provider Notified: Dr. Antonieta Pert  Orders Received? Or Actions Taken?: No new orders. Critical value r/t CLL, chronic

## 2022-02-27 NOTE — Progress Notes (Signed)
Hypoglycemic Event  CBG: 64  Treatment: 4 oz juice/soda  Symptoms: None  Follow-up CBG: Time:2342 CBG Result:78  Possible Reasons for Event: Other: Pt transported via [facility] without TPN running  Comments/MD notified:hypoglycemic standing orders initiated, pharmacy notified RE: TPN with new orders for D10 @ 154m/hr fluids initiated

## 2022-02-28 ENCOUNTER — Inpatient Hospital Stay (HOSPITAL_COMMUNITY): Payer: Medicaid Other

## 2022-02-28 ENCOUNTER — Ambulatory Visit
Admit: 2022-02-28 | Discharge: 2022-02-28 | Disposition: A | Discharge status: Home / Self Care | Payer: Medicaid Other | Attending: Radiation Oncology | Admitting: Radiation Oncology

## 2022-02-28 ENCOUNTER — Other Ambulatory Visit: Payer: Self-pay

## 2022-02-28 ENCOUNTER — Ambulatory Visit: Payer: Medicaid Other

## 2022-02-28 DIAGNOSIS — J940 Chylous effusion: Secondary | ICD-10-CM | POA: Diagnosis not present

## 2022-02-28 DIAGNOSIS — J9601 Acute respiratory failure with hypoxia: Secondary | ICD-10-CM | POA: Diagnosis not present

## 2022-02-28 LAB — RAD ONC ARIA SESSION SUMMARY
Course Elapsed Days: 1
Plan Fractions Treated to Date: 2
Plan Prescribed Dose Per Fraction: 2.5 Gy
Plan Total Fractions Prescribed: 10
Plan Total Prescribed Dose: 25 Gy
Reference Point Dosage Given to Date: 5 Gy
Reference Point Session Dosage Given: 2.5 Gy
Session Number: 2

## 2022-02-28 LAB — GLUCOSE, CAPILLARY
Glucose-Capillary: 120 mg/dL — ABNORMAL HIGH (ref 70–99)
Glucose-Capillary: 150 mg/dL — ABNORMAL HIGH (ref 70–99)
Glucose-Capillary: 169 mg/dL — ABNORMAL HIGH (ref 70–99)
Glucose-Capillary: 192 mg/dL — ABNORMAL HIGH (ref 70–99)
Glucose-Capillary: 212 mg/dL — ABNORMAL HIGH (ref 70–99)
Glucose-Capillary: 224 mg/dL — ABNORMAL HIGH (ref 70–99)
Glucose-Capillary: 260 mg/dL — ABNORMAL HIGH (ref 70–99)

## 2022-02-28 LAB — BASIC METABOLIC PANEL
Anion gap: 6 (ref 5–15)
BUN: 27 mg/dL — ABNORMAL HIGH (ref 8–23)
CO2: 29 mmol/L (ref 22–32)
Calcium: 7.5 mg/dL — ABNORMAL LOW (ref 8.9–10.3)
Chloride: 100 mmol/L (ref 98–111)
Creatinine, Ser: 0.43 mg/dL — ABNORMAL LOW (ref 0.44–1.00)
GFR, Estimated: >60 mL/min (ref >60)
Glucose, Bld: 187 mg/dL — ABNORMAL HIGH (ref 70–99)
Potassium: 5.6 mmol/L — ABNORMAL HIGH (ref 3.5–5.1)
Sodium: 135 mmol/L (ref 135–145)

## 2022-02-28 LAB — POTASSIUM
Potassium: 5.6 mmol/L — ABNORMAL HIGH (ref 3.5–5.1)
Potassium: 4.4 mmol/L (ref 3.5–5.1)

## 2022-02-28 LAB — PHOSPHORUS: Phosphorus: 2.6 mg/dL (ref 2.5–4.6)

## 2022-02-28 LAB — PATHOLOGIST SMEAR REVIEW

## 2022-02-28 LAB — MAGNESIUM: Magnesium: 1.8 mg/dL (ref 1.7–2.4)

## 2022-02-28 LAB — IGVH SOMATIC HYPERMUTATION

## 2022-02-28 MED ORDER — INSULIN GLARGINE-YFGN 100 UNIT/ML ~~LOC~~ SOLN
8.0000 [IU] | Freq: Every day | SUBCUTANEOUS | Status: DC
Start: 2022-02-28 — End: 2022-03-03
  Administered 2022-02-28 – 2022-03-03 (×4): 8 [IU] via SUBCUTANEOUS
  Filled 2022-02-28 (×4): qty 0.08

## 2022-02-28 MED ORDER — DEXTROSE 10 % IV SOLN: INTRAVENOUS | Status: AC

## 2022-02-28 MED ORDER — TRACE MINERALS CU-MN-SE-ZN 300-55-60-3000 MCG/ML IV SOLN
INTRAVENOUS | Status: AC
  Filled 2022-02-28: qty 1000

## 2022-02-28 NOTE — Progress Notes (Signed)
PROGRESS NOTE [name]  CBJ:[id number] DOB: [birth year] DOA: 03/03/2022 PCP: Noreene Larsson, NP   Brief Narrative/Hospital Course: 87 yof  w/ CLL, depression, anxiety, HLD, HTN presented to [facility] ED on 6/2 unresponsive. On 6/2 patient called EMS for abdominal pain.  Patient initially was alert and talking but became unresponsive with AMS, hypoxic with agonal breathing and was placed on NRB, was noted to be hypotensive.In ED at [facility] remained unresponsive with agonal respirations and was intubated for airway protection and hypoxia.BP initially 81/65.  Given IV fluids.  Started on levo.  Fever 100.9 F and WBC 104.4.  Started on cefepime, Flagyl, Vanco.  Cultures pending.  Glucose 198, BNP 534, troponin 109 then 112, LA 4.4 then 1.8, UA unremarkable, UDS negative.  CXR with large left pleural effusion and small right pleural effusion.  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  CT abdomen with ascending colitis constipation, small intestine wall thickening possibly related to gastroenteritis; small volume ascites; extensive abdominopelvic and inguinal adenopathy.  PCCM consulted for ICU admission and transferred to [facility]. 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  Has been off pressors and subsequently transferred to [facility] service 6/6 Her encephalopathy was probably due to hypercarbia followed by oncology PCCM. 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  Chest tube continues to have a chylothorax managed by pulmonary  She has had recurrent chylothorax with ongoing chest tube and patient transferred to [facility] long 02/26/22 started radiation treatment to see whether this will help with lymphadenopathy from CLL. 6/15-slid up from the chair while trying to get up-PICC line got dislodged and reinserted no bruising   Subjective: Seen and examined this morning. Reports tailbone does not hurt after taking Tylenol.   Overnight no fever Labs with potassium 5.6 being rechecked Chest tube with chylothorax-output  1400cc> 1000cc/past 24 hr  Assessment and Plan: Principal Problem:   Acute respiratory failure with hypoxia (HCC) Active Problems:   Sepsis ([place])   CLL (chronic lymphocytic leukemia) (HCC)   Shock ([place])   Chronic diastolic heart failure (HCC)   DNR (do not resuscitate)   Protein-calorie malnutrition, severe   Pressure injury of skin   Bilateral chylothorax   Small cell B-cell lymphoma (HCC)   Leukocytosis   DM type 2 (diabetes mellitus, type 2) ([place])   Acute respiratory failure with hypoxia-resolved.  On room air now.    Bilateral chylothorax: With recurrent chylothorax, persistent output, being managed with chest tube, pulmonary following closely.  Cont octreotide was increased to 50 mcg tid-6/14 in an attempt to slow chyle production.  Sepsis/Shock/Hypotension poa- resolved.  S/p 5 days of antibiotic, no clear source was identified   CLL/SLL Significant leukocytosis: Oncology following.  Continue pulse prednisone 50 mg daily started XRT 6/15, rituximab is being considered.Has persistent leukocytosis in the setting of CLL and also steroid.  No fever  Chronic diastolic heart failure: Currently compensated.  Hyperkalemia mildly high at 5.6, taking potassium off TPN now.  recheck this afternoon-if still high  will consider Lokelma.  Type 2 diabetes mellitus, new onset A1c 6.5,  uncontrolled hyperglycemia more than 200 and also hypoglycemia 64 on 6/14 pm after holding TPN, sugar now uptrending-resume Semglee if going to back on TPN discussed with pharmacy- who are managing.  Continue SSI.  Contsider Premeal insulin if remains uncontrolled Recent Labs  Lab 02/27/22 1839 02/27/22 2035 02/27/22 2359 02/28/22 0342 02/28/22 0802  GLUCAP 315* 197* 192* 224* 169*    Protein-calorie malnutrition, severe: Augment nutritional status supplement Nutrition Status: Nutrition Problem: Severe Malnutrition Etiology: chronic illness (CLL) Signs/Symptoms: severe muscle depletion, severe fat depletion Interventions: TPN  Deconditioning/debility need PT OT once chest tube is out. Pt slided to floor-unwitnessed from [facility] yesterday during fire alarm-was seen at bedside, reports she was standing from the wheelchair trying to pee and fell on floor, did not inform transported or nursing staff prior to standing- she had some tailbone pain after falling on bottom, but pain controlled on Tylenol, no bruising, will get x-ray coccyx/lumbar area  DVT prophylaxis: enoxaparin (LOVENOX) injection 40 mg Start: 02/16/22 1515 Code Status:   Code Status: DNR Family Communication: plan of care discussed with patient/family on phone Patient status is: inpatient because of need for xrt and CLL management. Level of care: Med-Surg   Dispo: The patient is from: Home, independent.            Anticipated disposition: TBD, needs PT OT evaluation once respiratory status chest tube stable Mobility Assessment (last 72 hours)     Mobility Assessment     Row Name 02/27/22 2015 02/27/22 0905 02/26/22 2347 02/26/22 2033 02/26/22 0800   Does patient have an order for bedrest or is patient medically unstable No - Continue assessment No - Continue assessment No - Continue assessment No - Continue assessment No - Continue assessment   What is the highest level of mobility based on the progressive mobility assessment?  Level 4 (Walks with assist in room) - Balance while marching in place and cannot step forward and back - Complete Level 4 (Walks with assist in room) - Balance while marching in place and cannot step forward and back - Complete Level 3 (Stands with assist) - Balance while standing  and cannot march in place Level 3 (Stands with assist) - Balance while standing  and cannot march in place Level 3 (Stands with assist) - Balance while standing  and cannot march in place   Is the above level different from baseline mobility prior to current illness? Yes - Recommend PT order Yes - Recommend PT order Yes - Recommend PT order Yes - Recommend PT order Yes - Recommend PT order    [place] Name 02/25/22 1941           Does patient have an order for bedrest or is patient medically unstable No - Continue assessment       What is the highest level of mobility based on the progressive mobility assessment? Level 3 (Stands with assist) - Balance while standing  and cannot march in place       Is the above level different from baseline mobility prior to current illness? Yes - Recommend PT order                 Objective: Vitals last 24 hrs: Vitals:   02/27/22 1135 02/27/22 1336 02/27/22 2053 02/28/22 0344  BP: 103/77 100/61 114/84 103/66  Pulse: (!) 108 95 98 98  Resp: (!) 32 _0 Temp: 99.4 F (37.4 C)  98.7 F (37.1 C) 98.8 F (37.1 C) 99 F (37.2 C)  TempSrc: Oral Oral Oral Oral  SpO2: 94% 96% 98% 94%  Weight:      Height:       Weight change:   Physical Examination: General exam: AAox3, older than stated age, weak appearing. HEENT:Oral mucosa moist, Ear/Nose WNL grossly, dentition normal. Respiratory system: bilaterally clear, no use of accessory muscle chest tube on the right posterior chest wall, Cardiovascular system: S1 & S2 +, No JVD,. Gastrointestinal system: Abdomen soft,NT,ND,BS+ Nervous System:Alert, awake, moving extremities and grossly nonfocal Extremities: LE ankle edema neg,  distal peripheral pulses palpable.  Skin: No rashes,no icterus. MSK: Normal muscle bulk,tone, power  PICC+ LUE  Medications reviewed: Scheduled Meds:  chlorhexidine gluconate (MEDLINE KIT)  15 mL Mouth Rinse BID   Chlorhexidine Gluconate Cloth  6 each Topical Daily   docusate sodium  100 mg Oral BID   enoxaparin (LOVENOX) injection  40 mg Subcutaneous Q24H   feeding supplement  1 Container Oral BID BM   insulin aspart  0-9 Units Subcutaneous Q4H   insulin glargine-yfgn  8 Units Subcutaneous Daily   octreotide  50 mcg Subcutaneous TID   polyethylene glycol  17 g Oral Daily   predniSONE  50 mg Oral Q breakfast   sodium chloride flush  10 mL Other Q8H   sodium chloride flush  10-40 mL Intracatheter Q12H   Continuous Infusions:  sodium chloride 10 mL/hr at 02/27/22 1421   dextrose     TPN ADULT (ION)     Diet Order             Diet regular Room service appropriate? Yes; Fluid consistency: Thin  Diet effective now                    Nutrition Problem: Severe Malnutrition Etiology: chronic illness (CLL) Signs/Symptoms: severe muscle depletion, severe fat depletion Interventions: TPN   Intake/Output Summary (Last 24 hours) at 02/28/2022 0959 Last data filed at 02/28/2022 0525 Gross per 24 hour  Intake 933.87 ml  Output 1000 ml  Net -66.13 ml   Net IO Since Admission: -7,013.1 mL [02/28/22 0959]  Wt Readings from Last 3 Encounters:  02/26/22 50.6 kg  03/08/20 64.4 kg  02/07/20 68.5 kg     Unresulted Labs (From admission, onward)     Start     Ordered   03/01/22 6948  Basic metabolic panel  Tomorrow morning,   R       Question:  Specimen collection method  Answer:  IV Team=IV Team collect   02/28/22 0953   03/01/22 0500  Magnesium  Tomorrow morning,   R       Question:  Specimen collection method  Answer:  IV Team=IV Team collect   02/28/22 0953   03/01/22 0500  Phosphorus  Tomorrow morning,   R       Question:  Specimen collection method  Answer:  IV Team=IV  Team collect   02/28/22 5462   02/27/22 7035  Pathologist smear review  Once,   AD        02/27/22 0613   02/27/22 0500  Comprehensive metabolic panel  (TPN Lab Panel)  Every Mon,Thu (0500),   R     Question:  Specimen collection method  Answer:  Unit=Unit collect   02/25/22 1228   02/27/22 0500  Magnesium  (TPN Lab Panel)  Every Mon,Thu (0500),   R     Question:  Specimen collection method  Answer:  Unit=Unit collect   02/25/22 1228   02/27/22 0500  Phosphorus  (TPN Lab Panel)  Every Mon,Thu (0500),   R     Question:  Specimen collection method  Answer:  Unit=Unit collect   02/25/22 1228   02/27/22 0500  Triglycerides  (TPN Lab Panel)  Every Mon,Thu (0500),   R     Question:  Specimen collection method  Answer:  Unit=Unit collect   02/25/22 1228   02/25/22 1700  Vitamin A  Once,   R       Question:  Specimen collection method  Answer:  Unit=Unit collect   02/25/22 1343   02/25/22 1700  Zinc  Once,   R       Question:  Specimen collection method  Answer:  Unit=Unit collect   02/25/22 1343   03/03/2022 1600  IgVH Somatic Hypermutation  Once,   R        03/12/2022 1600   02/25/2022 1442  Acid Fast Culture with reflexed sensitivities  (AFB smear + Culture w reflexed sensitivities panel)  Once,   R       See Hyperspace for full Linked Orders Report.   03/08/2022 1441          Data Reviewed: I have personally reviewed following labs and imaging studies CBC: Recent Labs  Lab 02/23/22 0031 02/23/2022 0413 02/25/22 0056 02/26/22 0950 02/27/22 0613  WBC 104.7* 106.3* 92.1* 86.8* 98.2*  NEUTROABS 5.2 5.4 7.0 6.1 7.1  HGB 13.0 12.3 11.0* 11.2* 12.3  HCT 40.4 39.5 36.3 35.8* 39.6  MCV 91.0 91.6 93.3 93.7 91.7  PLT 403* 498* 474* 508* 284*   Basic Metabolic Panel: Recent Labs  Lab 03/06/2022 0413 02/25/22 0056 02/26/22 0341 02/27/22 0613 02/28/22 0422 02/28/22 0727  NA 138 137 138 137 135  --   K 4.8 5.1 4.7 4.7 5.6* 5.6*  CL 101 102 101 103 100  --   CO2 30 30 33* 30 29  --    GLUCOSE 144* 148* 128* 93 187*  --   BUN _0 28* 27*  --   CREATININE 0.41* 0.43* 0.45 0.49 0.43*  --   CALCIUM 8.0* 7.8* 8.1* 8.1* 7.5*  --   MG  --  1.9 2.0 2.0 1.8  --   PHOS  --   --  3.2 1.9* 2.6  --    GFR: Estimated Creatinine Clearance: 59 mL/min (A) (by C-G formula based on SCr of 0.43 mg/dL (L)). Liver Function Tests: Recent Labs  Lab 02/26/22 0341 02/27/22 0613  AST 36 33  ALT 39 35  ALKPHOS 69 71  BILITOT 0.3 0.3  PROT 4.6* 4.9*  ALBUMIN 2.0* 2.2*   No results for input(s): "LIPASE", "AMYLASE" in the last 168 hours. No results for input(s): "AMMONIA" in the last 168 hours. Coagulation Profile: No results for input(s): "INR", "PROTIME" in the last 168 hours. BNP (last 3 results) No results for input(s): "PROBNP" in the last 8760 hours. HbA1C: No results for input(s): "HGBA1C" in the last 72 hours. CBG: Recent Labs  Lab 02/27/22 1839 02/27/22 2035 02/27/22 2359 02/28/22 0342 02/28/22 0802  GLUCAP 315* 197* 192* 224* 169*   Lipid Profile: Recent Labs    02/26/22 0341 02/27/22 0613  TRIG 90 108   Thyroid Function Tests: No results for input(s): "TSH", "T4TOTAL", "FREET4", "T3FREE", "THYROIDAB" in the last 72 hours. Sepsis Labs: No results for input(s): "PROCALCITON", "LATICACIDVEN" in the last 168 hours.  Recent Results (from the past  240 hour(s))  Body fluid culture w Gram Stain     Status: None   Collection Time: 02/15/2022  2:35 PM   Specimen: Pleural Fluid  Result Value Ref Range Status   Specimen Description PLEURAL  Final   Special Requests NONE  Final   Gram Stain   Final    FEW WBC PRESENT, PREDOMINANTLY PMN NO ORGANISMS SEEN    Culture   Final    NO GROWTH 3 DAYS Performed at [facility], [place] [address], [place], [place] [address]    Report Status 03/07/2022 FINAL  Final  Fungus Culture With Stain     Status: None (Preliminary result)   Collection Time: 02/15/2022  2:35 PM   Specimen: Pleural Fluid  Result Value Ref  Range Status   Fungus Stain Final report  Final    Comment: (NOTE) Performed At: [facility] [place], [place] [id number] Rush Farmer MD [id number]    Fungus (Mycology) Culture PENDING  Incomplete   Fungal Source PLEURAL  Final    Comment: Performed at [facility], [place] [address] [place], [place] [address]  Acid Fast Smear (AFB)     Status: None   Collection Time: 03/03/2022  2:35 PM   Specimen: Pleural, Left  Result Value Ref Range Status   AFB Specimen Processing Concentration  Final   Acid Fast Smear Negative  Final    Comment: (NOTE) Performed At: [facility] [place], [place] [id number] Rush Farmer MD [id number]    Source (AFB) PLEURAL  Final    Comment: Performed at [facility], [place] [address] [place], [place] [address]  Fungus Culture Result     Status: None   Collection Time: 03/01/2022  2:35 PM  Result Value Ref Range Status   Result 1 Comment  Final    Comment: (NOTE) KOH/Calcofluor preparation:  no fungus observed. Performed At: [facility] [place], [place] [id number] Rush Farmer MD WL:[id number]     Antimicrobials: Anti-infectives (From admission, onward)    Start     Dose/Rate Route Frequency Ordered Stop   02/17/22 1600  metroNIDAZOLE (FLAGYL) IVPB 500 mg        500 mg 100 mL/hr over 60 Minutes Intravenous Every 12 hours 02/17/22 0713 02/18/22 1926   02/16/22 1600  ceFEPIme (MAXIPIME) 2 g in sodium chloride 0.9 % 100 mL IVPB        2 g 200 mL/hr over 30 Minutes Intravenous Every 12 hours 02/16/22 0818 02/18/22 1847   02/16/22 1500  metroNIDAZOLE (FLAGYL) IVPB 500 mg  Status:  Discontinued        500 mg 100 mL/hr over 60 Minutes Intravenous Every 12 hours 02/16/22 1404 02/17/22 0711   02/15/22 1800  vancomycin (VANCOREADY) IVPB 1250 mg/250 mL  Status:  Discontinued        1,250 mg 166.7 mL/hr over 90 Minutes Intravenous Every 24 hours 02/16/2022 1955 02/15/22  0737   02/15/22 1415  metroNIDAZOLE (FLAGYL) IVPB 500 mg  Status:  Discontinued        500 mg 100 mL/hr over 60 Minutes Intravenous Every 12 hours 02/15/22 1325 02/16/22 0840   02/15/22 0400  ceFEPIme (MAXIPIME) 2 g in sodium chloride 0.9 % 100 mL IVPB  Status:  Discontinued        2 g 200 mL/hr over 30 Minutes Intravenous Every 8 hours 02/25/2022 1955 02/16/22 0817   02/22/2022 2000  vancomycin (VANCOREADY) IVPB 1250 mg/250  mL  Status:  Discontinued        1,250 mg 166.7 mL/hr over 90 Minutes Intravenous Every 24 hours 02/27/2022 1952 02/28/2022 1955   03/13/2022 1945  ceFEPIme (MAXIPIME) 2 g in sodium chloride 0.9 % 100 mL IVPB        2 g 200 mL/hr over 30 Minutes Intravenous  Once 03/10/2022 1931 03/03/2022 2051   02/26/2022 1945  metroNIDAZOLE (FLAGYL) IVPB 500 mg        500 mg 100 mL/hr over 60 Minutes Intravenous  Once 03/05/2022 1931 03/03/2022 2053   03/12/2022 1945  vancomycin (VANCOCIN) IVPB 1000 mg/200 mL premix  Status:  Discontinued        1,000 mg 200 mL/hr over 60 Minutes Intravenous  Once 03/10/2022 1931 02/13/2022 2053      Culture/Microbiology    Component Value Date/Time   SDES PLEURAL 03/03/2022 1435   SPECREQUEST NONE 03/13/2022 1435   CULT  03/11/2022 1435    NO GROWTH 3 DAYS Performed at [facility], [place] [address], [place], [place] [address]    REPTSTATUS 02/17/2022 FINAL 02/19/2022 1435    Other culture-see note  Radiology Studies: DG Chest 1 View  Result Date: 02/27/2022 CLINICAL DATA:  Chest crackles.  Chest tube placement. EXAM: CHEST  1 VIEW COMPARISON:  Earlier same day.  02/26/2022. FINDINGS: No visible pleural fluid on the right. No right pneumothorax. Mild atelectasis at the right lung base. Pleural catheter overlies the lower thoracic midline. Large left pleural effusion is unchanged with collapse of the left lower lobe. Skin fold present on the left. Right arm PICC has been removed. IMPRESSION: Pleural catheter projected over the midline. No visible right effusion  or right pneumothorax. Persistent large effusion on the left with left lower lobe collapse. Electronically Signed   By: Nelson Chimes M.D.   On: 02/27/2022 11:48   [place] EKG SITE RITE  Result Date: 02/27/2022 If Site Rite image not attached, placement could not be confirmed due to current cardiac rhythm.  DG Chest Port 1 View  Result Date: 02/27/2022 CLINICAL DATA:  Respiratory failure.  Chest tube. EXAM: PORTABLE CHEST 1 VIEW COMPARISON:  Portable chest yesterday at 5:50 a.m. FINDINGS: 5:07 a.m., 02/27/2022.  Patient is moderately rotated to the left. Pigtail of the pleural catheter is now left of the midline at the level of T12. No pneumothorax is seen. There is only a small right pleural effusion remaining and it appears improved. Right PICC tip remains at the superior cavoatrial junction. A large left pleural effusion again obscures the lower 2/3 of the left chest. There is aortic atherosclerosis and tortuosity. The cardiac size is stable. Remaining lungs clear with COPD change. There are surgical clips in the right axilla. Osteopenia and degenerative change. IMPRESSION: 1. The pigtail of the pleural catheter is now left of the midline in the base of the chest, exact position unclear but there is only minimal right pleural fluid remaining today. 2. Large left pleural effusion obscuring the lower 2/3 of the left chest,, stable. 3. COPD, aortic atherosclerosis. Electronically Signed   By: Telford Nab M.D.   On: 02/27/2022 06:52     LOS: 14 days   Antonieta Pert, MD [facility]  02/28/2022, 9:59 AM

## 2022-02-28 NOTE — Progress Notes (Signed)
Patient c/o tenderness/discomfort to right lower back. RN assessed area, chest tube catheter appeared to have dislodged more than prior, sutures intact, no water leak noted. Dressing found no longer intact. Rapid response RN's and oncoming night RN at bedside. Provider on call paged by oncoming night RN. New orders pending. Patient stable in bed eating dinner.

## 2022-02-28 NOTE — Progress Notes (Signed)
Chest tube assessed at bedside. Milky white drainage in tubing and [place].  Dressing removed, site cleaned, chest tube noted to be folded / kinked, gauze placed under catheter hub to protect skin.  Chest tube flushed.  Biopatch applied, new dressing in place.  Reviewed with RN.    Noe Gens, MSN, APRN, NP-C, AGACNP-BC [facility] 02/28/2022, 6:01 PM   Please see [email or web address] for pager details.   From 7A-7P if no response, please call [phone] After hours, please call ELink [phone]

## 2022-02-28 NOTE — Progress Notes (Signed)
Inpatient Diabetes Program Recommendations  AACE/ADA: New Consensus Statement on Inpatient Glycemic Control (2015)  Target Ranges:  Prepandial:   less than 140 mg/dL      Peak postprandial:   less than 180 mg/dL (1-2 hours)      Critically ill patients:  140 - 180 mg/dL   Lab Results  Component Value Date   GLUCAP 169 (H) 02/28/2022   HGBA1C 6.5 (H) 02/15/2022    Review of Glycemic Control  Diabetes history: None Outpatient Diabetes medications: None Current orders for Inpatient glycemic control: Semglee 8 units QD, Novolog 0-9 units Q4H  HgbA1C - 6.5%  On Prednisone 50 mg QAM  Inpatient Diabetes Program Recommendations:    Add Novolog 2 units TID with meals if eating > 50%  Continue to follow glucose trends.  Thank you. Lorenda Peck, RD, LDN, CDE Inpatient Diabetes Coordinator [phone]

## 2022-02-28 NOTE — Progress Notes (Signed)
Chest tube appeared to be slightly dislodged with catheter bent. VSS. Patient not is distress. Rapid response RN and CN notified. Rapid response RN at bedside, order for STAT chest x-ray. No chest tube water leak indicated.Pulmonary provider notified, wants chest x-ray results. Chest x-ray done. Critical care NP at bedside, chest tube care was performed. Patient remains stable, tachypnea due to being nervous, relaxation exercises and reassurance provided, patient responded well. Patient stated she is feeling better and "calming down now". O2 on at 2L [place].  Patient educated and encourage to lay on her left side, pillows provided for placement.

## 2022-02-28 NOTE — Progress Notes (Addendum)
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  Assessment: 61 year old female with pertinent PMH of CLL, depression, anxiety, HLD, HTN presents to [facility] ED on 6/2 unresponsive.  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 Patient also has bilateral chylothorax with high output.  Pharmacy has been consulted for TPN.  Glucose / Insulin: new onset DM2, A1c 6.5;  On sSSI q4h, semglee 8 units daily DC'd on 6/15 by TRH d/t episode of hypoglycemia that occurred when TPN was stopped & before D10 was hung: d/w Dr Lupita Leash resume Semglee 8 units daily - CBGs after TPN was resumed 6/15 PM = 197, 192, 224, serum gluc 187 - used 16 units of SSI past 24 hrs on D10@ 100 ml/hr until 1800, then TPN @ 100 ml/hr - CBGs (goal <150) - on prednisone 50 mg daily  Electrolytes: phos  2.6 after 15 mM Na phos yesterday, K 5.6> repeat K = 5.6 , all other lytes WNL including CorrCa, Mag 1.8 ( no ileus or Afib- OK)  Renal: Scr < 1, BUN slightly elevated at 27 Hepatic: LFTs wnl - TG 108 - albumin low 2.2 Intake / Output: +174 ml - Chest tube: 1000 ml - 6 x urine occurrence -Po intake 240 mls MIVF:  none  GI Imaging: 6/10 CT Chest Bilateral large pleural effusions and bulky axillary and lower cervical lymphadenopathy  GI Surgeries / Procedures: None this admission  Central access: PICC placed 6/15 TPN start date: 6/13  Nutritional Goals: Goal TPN rate is 100 mL/hr (provides 150 g of protein and 2357 kcals per day)  RD Assessment: Estimated Needs Total Energy Estimated Needs: 2300-2600 Total Protein Estimated Needs: 140-160 grams Total Fluid Estimated Needs: >2.5 L  Current Nutrition:  -  Low fat diet, no more than 7 g fat per meal - Boost/resource breeze BID -   TPN  Significant Events:  - 6/14: TPN turned off at ~10:30p for transport to [facility]. D10 started while off TPN  Plan:  Now: stop TPN @ 100 ml/hr  & hang D10 @ 100 ml/hr due to K = 5.6 at 1800:  - resume TPN at 100 mL/hr at 1800  - Electrolytes in TPN:  Na 50 mEq/L, K  0 mEq/L Ca 5 mEq/L Mg 7 mEq/L Phos 15 mmol/L Cl:Ac 1:1_- changed to 1:2 due to no K in TPN - Add standard MVI and trace elements to TPN - resume semglee 8 units daily (after D10 hung) - Continue Sensitive q4h SSI and adjust as needed - Monitor TPN labs on Mon/Thurs - Bmet, Mag and Phos on 6/17  Eudelia Bunch, Pharm.D 02/28/2022 9:51 AM

## 2022-02-28 NOTE — Progress Notes (Addendum)
[facility] Progress Note Patient Name: [name] DOB: 06-15-1961 MRN: [id number]   Date of Service  02/28/2022  HPI/Events of Note  Pleural catheter is kinked just prior to the skin but it is draining satisfactorily per bedside RN.  eICU Interventions  Bedside RN instructed to un-kink the catheter and  place a pressure dressing over the catheter to  hold un-kinked catheter in place. They will monitor the catheter closely to make sure it continues to drain.        Kerry Kass Etana Beets 02/28/2022, 8:55 PM

## 2022-02-28 NOTE — Progress Notes (Signed)
NAME:  [name], MRN:  [id number], DOB:  [birth year], LOS: 42 ADMISSION DATE:  02/13/2022, CONSULTATION DATE:  6/2 REFERRING MD:  Dr. Alvino Chapel, CHIEF COMPLAINT:  Unresponsive; Acute resp failure   BRIEF  Patient is a 61 year old female with pertinent PMH of CLL, depression, anxiety, HLD, HTN presents to [facility] ED on 6/2 unresponsive.  On 6/2 patient called EMS for abdominal pain.  Patient initially was alert and talking but became unresponsive with AMS.  Patient was hypoxic with agonal breathing and was placed on NRB.  Patient was noted to be hypotensive.  Patient transported to [facility] ED.  Upon arrival to [facility] ED patient remained unresponsive with agonal respirations.  Patient intubated for airway protection and hypoxia.  BP initially 81/65.  Given IV fluids.  Started on levo.  Fever 100.9 F and WBC 104.4.  Started on cefepime, Flagyl, Vanco.  Cultures pending.  Glucose 198, BNP 534, troponin 109 then 112, LA 4.4 then 1.8, UA unremarkable, UDS negative.  CXR with large left pleural effusion and small right pleural effusion.  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  CT abdomen with ascending colitis constipation, small intestine wall thickening possibly related to gastroenteritis; small volume ascites; extensive abdominopelvic and inguinal adenopathy.   PCCM was consulted for bilateral pleural effusion  Pertinent  Medical History    has a past medical history of Anxiety, Chronic abdominal pain, Chronic lymphocytic leukemia (CLL), B-cell ([place]) (04/04/2016), CLL (chronic lymphocytic leukemia) ([place]), Depression with anxiety (07/07/2015), Family history of hyperlipidemia (12/14/2019), H. 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   has a past surgical history that includes Surgery of left middle finger on left hand (childhood ); Tubal ligation; Cholecystectomy; Colonoscopy (02/07/2011); Hammer toe surgery (Left); Axillary lymph node biopsy (Right, 03/28/2016); Hysteroscopy with D & C (N/A, 07/30/2016); Thoracentesis (Left, 03/08/2022); and Chest tube insertion (N/A, 03/06/2022).     Significant Hospital Events: Including procedures, antibiotic start and stop dates in addition to other pertinent events   6/2: Patient admitted to [facility] ED unresponsive; intubated; shock started on levo; transferred to [facility]. CT chest and abd: 1. Extensive abdominopelvic and inguinal adenopathy, most likely related to lymphoma/leukemia. Numerous small hypodense splenic lesions versus bolus phase phenomenon. Intrahepatic periportal edema which is probably either due to fluid overload or hepatic dysfunction. The portal vein is patent within normal caliber limits. Chronic calcific pancreatitis but no findings of acute pancreatitis or ductal dilatation.  Ascending colitis versus nondistention, remainder of the colon with constipation. Diffuse small intestinal wall thickening with gastric fold thickening, could relate to gastroenteritis or hepatic dysfunction versus reactive from ascites versus congestive or infiltrating disease. 6/3 right chest tube placed. Left thora 2000 ml removed - milky 6/4 extubated -exudative, cx negative, lymphocytic. 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 Off pressors 6/6 Transferred to [facility] 6/6 ultrasound-guided right axillary lymph node biopsy -Started on pulse dose steroids at 50 mg prednisone 6/7 chest tube output over a liter 6/8 left thoracentesis for 1400 cc of fluid 6/10 - chest tube removed 6/12 - s/p right chest tube placement with 3.1 L drained  6/3 - Transferred from [facility] to [facility] 6/13 for initiation of palliative radiotherapy 02/26/22 - No chest pain. Saturation 96% on 2 L.  Chest tube drained 190 cc  of milky fluid 02/27/22 - now at [facility] long hospital bed 1610. Per RN - fall earlier today . CXR stable. Doing well. Got SIM for XRT.  On room air. HAs Chest tube right side draining chylothorax. Has mod-large effusion on the left side -refused chest tube.  Remains on octreiotice  Interim History / Subjective:   6/16 - on TPN.  On [place] O2 . > 1000 cc rt sided chest tube output.  pulse prednisone 50 mg daily started XRT 6/15, rituximab is being considered   Objective   Blood pressure 103/66, pulse 98, temperature 99 F (37.2 C), temperature source Oral, resp. rate 20, height '5\' 5"'$  (1.651 m), weight 50.6 kg, last menstrual period 07/25/2016, SpO2 94 %.        Intake/Output Summary (Last 24 hours) at 02/28/2022 1614 Last data filed at 02/28/2022 1015 Gross per 24 hour  Intake 1173.87 ml  Output 1000 ml  Net 173.87 ml   Filed Weights   02/23/2022 0500 03/10/2022 0744 02/26/22 2314  Weight: 53.1 kg 51.9 kg 50.6 kg   Physical Exam: Frail female. On o2. Rt chest tube with chylothorax.     Assessment & Plan:  Bilateral chylothorax Acute hypoxic respiratory failure due to above  Left effusion is wors over time - refused left chst tube  , right chest tube continues to drain  Plan  - continuie  octreotide to 50 mcg 3 times daily  in attempt to slow chyle production  - hopefully chemo helps  - o2 for pulze ox > 92% (racial pulsd ox gap)  Protein calorie malnutrition, moderate to severe - per triad  CLL/SLL -Per oncology, on prednisone Rituximab being considered  Chronic HFpEF due to uncontrolled hypertension  Per triad     Ccm wil round 2x/week - anticipate slow imprivement if at all   SIGNATURE    Dr. Brand Males, M.D., F.C.C.P,  Pulmonary and Critical Care Medicine Staff Physician, [place] Director - Interstitial Lung Disease  Program  Medical Director - [facility] [place]  at [place], [place], [address]  NPI Number:  NPI #[id number] [place] Number: [id number]  Pager: [phone], If no answer  -> Check AMION or Try [phone] Telephone (clinical office): [phone] Telephone (research): [phone]  4:14 PM 02/28/2022    LABS    PULMONARY No results for input(s): "PHART", "PCO2ART", "PO2ART", "HCO3", "TCO2", "O2SAT" in the last 168 hours.  Invalid input(s): "PCO2", "PO2"  CBC Recent Labs  Lab 02/25/22 0056 02/26/22 0950 02/27/22 0613  HGB 11.0* 11.2* 12.3  HCT 36.3 35.8* 39.6  WBC 92.1* 86.8* 98.2*  PLT 474* 508* 457*    COAGULATION No results for input(s): "INR" in the last 168 hours.  CARDIAC  No results for input(s): "TROPONINI" in the last 168 hours. No results for input(s): "PROBNP" in the last 168 hours.   CHEMISTRY Recent Labs  Lab 03/06/2022 0413 02/25/22 0056 02/26/22 0341 02/27/22 0613 02/28/22 0422 02/28/22 0727  NA 138 137 138 137 135  --   K 4.8 5.1 4.7 4.7 5.6* 5.6*  CL 101 102 101 103 100  --   CO2 30 30 33* 30 29  --   GLUCOSE 144* 148* 128* 93 187*  --   BUN '15 20 21 '$ 28* 27*  --   CREATININE 0.41* 0.43* 0.45 0.49 0.43*  --   CALCIUM 8.0* 7.8* 8.1* 8.1* 7.5*  --  MG  --  1.9 2.0 2.0 1.8  --   PHOS  --   --  3.2 1.9* 2.6  --    Estimated Creatinine Clearance: 59 mL/min (A) (by C-G formula based on SCr of 0.43 mg/dL (L)).   LIVER Recent Labs  Lab 02/26/22 0341 02/27/22 0613  AST 36 33  ALT 39 35  ALKPHOS 69 71  BILITOT 0.3 0.3  PROT 4.6* 4.9*  ALBUMIN 2.0* 2.2*     INFECTIOUS No results for input(s): "LATICACIDVEN", "PROCALCITON" in the last 168 hours.   ENDOCRINE CBG (last 3)  Recent Labs    02/28/22 0342 02/28/22 0802 02/28/22 1232  GLUCAP 224* 169* 120*         IMAGING x48h  - image(s) personally visualized  -   highlighted in bold DG Lumbar Spine 2-3 Views  Result Date: 02/28/2022 CLINICAL DATA:  Multiple falls. Low back and tailbone pain. EXAM: LUMBAR  SPINE - 2-3 VIEW COMPARISON:  Abdominopelvic CT 03/11/2022. FINDINGS: Five lumbar type vertebral bodies. The alignment is normal. No evidence of acute fracture or pars defect. There is multilevel spondylosis with disc space narrowing and endplate osteophyte formation at the lower 3 levels. Mild facet degenerative changes are present. There is aortic atherosclerosis. IMPRESSION: No evidence of acute lumbar spine fracture or malalignment. Mild spondylosis. Electronically Signed   By: Richardean Sale M.D.   On: 02/28/2022 15:22   DG Sacrum/Coccyx  Result Date: 02/28/2022 CLINICAL DATA:  Multiple falls.  Low back and tailbone pain. EXAM: SACRUM AND COCCYX - 2+ VIEW COMPARISON:  Pelvic CT 03/14/2022. FINDINGS: The bones appear mildly demineralized. No evidence of acute fracture or sacroiliac joint diastasis. The symphysis pubis appears normal. Scattered pelvic calcifications, similar to previous CT and likely phleboliths. IMPRESSION: No evidence of acute sacrococcygeal fracture. Electronically Signed   By: Richardean Sale M.D.   On: 02/28/2022 15:20   DG Chest 1 View  Result Date: 02/27/2022 CLINICAL DATA:  Chest crackles.  Chest tube placement. EXAM: CHEST  1 VIEW COMPARISON:  Earlier same day.  02/26/2022. FINDINGS: No visible pleural fluid on the right. No right pneumothorax. Mild atelectasis at the right lung base. Pleural catheter overlies the lower thoracic midline. Large left pleural effusion is unchanged with collapse of the left lower lobe. Skin fold present on the left. Right arm PICC has been removed. IMPRESSION: Pleural catheter projected over the midline. No visible right effusion or right pneumothorax. Persistent large effusion on the left with left lower lobe collapse. Electronically Signed   By: Nelson Chimes M.D.   On: 02/27/2022 11:48   [place] EKG SITE RITE  Result Date: 02/27/2022 If Site Rite image not attached, placement could not be confirmed due to current cardiac rhythm.  DG Chest Port 1  View  Result Date: 02/27/2022 CLINICAL DATA:  Respiratory failure.  Chest tube. EXAM: PORTABLE CHEST 1 VIEW COMPARISON:  Portable chest yesterday at 5:50 a.m. FINDINGS: 5:07 a.m., 02/27/2022.  Patient is moderately rotated to the left. Pigtail of the pleural catheter is now left of the midline at the level of T12. No pneumothorax is seen. There is only a small right pleural effusion remaining and it appears improved. Right PICC tip remains at the superior cavoatrial junction. A large left pleural effusion again obscures the lower 2/3 of the left chest. There is aortic atherosclerosis and tortuosity. The cardiac size is stable. Remaining lungs clear with COPD change. There are surgical clips in the right axilla. Osteopenia and degenerative change. IMPRESSION: 1.  The pigtail of the pleural catheter is now left of the midline in the base of the chest, exact position unclear but there is only minimal right pleural fluid remaining today. 2. Large left pleural effusion obscuring the lower 2/3 of the left chest,, stable. 3. COPD, aortic atherosclerosis. Electronically Signed   By: Telford Nab M.D.   On: 02/27/2022 06:52

## 2022-03-01 ENCOUNTER — Inpatient Hospital Stay (HOSPITAL_COMMUNITY): Payer: Medicaid Other

## 2022-03-01 DIAGNOSIS — J940 Chylous effusion: Secondary | ICD-10-CM | POA: Diagnosis not present

## 2022-03-01 DIAGNOSIS — J9601 Acute respiratory failure with hypoxia: Secondary | ICD-10-CM | POA: Diagnosis not present

## 2022-03-01 LAB — MAGNESIUM: Magnesium: 2 mg/dL (ref 1.7–2.4)

## 2022-03-01 LAB — GLUCOSE, CAPILLARY
Glucose-Capillary: 109 mg/dL — ABNORMAL HIGH (ref 70–99)
Glucose-Capillary: 153 mg/dL — ABNORMAL HIGH (ref 70–99)
Glucose-Capillary: 227 mg/dL — ABNORMAL HIGH (ref 70–99)
Glucose-Capillary: 304 mg/dL — ABNORMAL HIGH (ref 70–99)
Glucose-Capillary: 340 mg/dL — ABNORMAL HIGH (ref 70–99)

## 2022-03-01 LAB — BASIC METABOLIC PANEL
Anion gap: <3 — ABNORMAL LOW (ref 5–15)
BUN: 27 mg/dL — ABNORMAL HIGH (ref 8–23)
CO2: 31 mmol/L (ref 22–32)
Calcium: 7.5 mg/dL — ABNORMAL LOW (ref 8.9–10.3)
Chloride: 103 mmol/L (ref 98–111)
Creatinine, Ser: 0.37 mg/dL — ABNORMAL LOW (ref 0.44–1.00)
GFR, Estimated: >60 mL/min (ref >60)
Glucose, Bld: 158 mg/dL — ABNORMAL HIGH (ref 70–99)
Potassium: 3.8 mmol/L (ref 3.5–5.1)
Sodium: 136 mmol/L (ref 135–145)

## 2022-03-01 LAB — PHOSPHORUS: Phosphorus: 3.1 mg/dL (ref 2.5–4.6)

## 2022-03-01 LAB — BLOOD GAS, ARTERIAL
Acid-base deficit: 0.1 mmol/L (ref 0.0–2.0)
Bicarbonate: 25.4 mmol/L (ref 20.0–28.0)
O2 Saturation: 94.6 %
Patient temperature: 37
pCO2 arterial: 44 mmHg (ref 32–48)
pH, Arterial: 7.37 (ref 7.35–7.45)
pO2, Arterial: 68 mmHg — ABNORMAL LOW (ref 83–108)

## 2022-03-01 LAB — ZINC: Zinc: 52 ug/dL (ref 44–115)

## 2022-03-01 LAB — VITAMIN A: Vitamin A (Retinoic Acid): 50.2 ug/dL (ref 22.0–69.5)

## 2022-03-01 MED ORDER — FUROSEMIDE 10 MG/ML IJ SOLN
40.0000 mg | INTRAMUSCULAR | Status: AC
  Administered 2022-03-01: 40 mg via INTRAVENOUS
  Filled 2022-03-01: qty 4

## 2022-03-01 MED ORDER — INSULIN ASPART 100 UNIT/ML IJ SOLN
0.0000 [IU] | INTRAMUSCULAR | Status: DC
  Administered 2022-03-01: 11 [IU] via SUBCUTANEOUS
  Administered 2022-03-02: 5 [IU] via SUBCUTANEOUS
  Administered 2022-03-02: 2 [IU] via SUBCUTANEOUS
  Administered 2022-03-02 (×2): 3 [IU] via SUBCUTANEOUS
  Administered 2022-03-02 (×2): 5 [IU] via SUBCUTANEOUS
  Administered 2022-03-03: 3 [IU] via SUBCUTANEOUS
  Administered 2022-03-03: 5 [IU] via SUBCUTANEOUS
  Administered 2022-03-03 (×2): 3 [IU] via SUBCUTANEOUS
  Administered 2022-03-03: 5 [IU] via SUBCUTANEOUS
  Administered 2022-03-04 (×2): 3 [IU] via SUBCUTANEOUS
  Administered 2022-03-04: 5 [IU] via SUBCUTANEOUS
  Administered 2022-03-04: 3 [IU] via SUBCUTANEOUS
  Administered 2022-03-05: 2 [IU] via SUBCUTANEOUS
  Administered 2022-03-05 – 2022-03-06 (×6): 3 [IU] via SUBCUTANEOUS
  Administered 2022-03-06 (×2): 5 [IU] via SUBCUTANEOUS
  Administered 2022-03-06 (×2): 3 [IU] via SUBCUTANEOUS
  Administered 2022-03-07: 2 [IU] via SUBCUTANEOUS
  Administered 2022-03-07: 3 [IU] via SUBCUTANEOUS
  Administered 2022-03-07: 5 [IU] via SUBCUTANEOUS
  Administered 2022-03-07: 2 [IU] via SUBCUTANEOUS
  Administered 2022-03-07: 3 [IU] via SUBCUTANEOUS
  Administered 2022-03-08: 5 [IU] via SUBCUTANEOUS
  Administered 2022-03-08: 2 [IU] via SUBCUTANEOUS
  Administered 2022-03-08 (×3): 3 [IU] via SUBCUTANEOUS
  Administered 2022-03-09: 11 [IU] via SUBCUTANEOUS
  Administered 2022-03-09: 3 [IU] via SUBCUTANEOUS
  Administered 2022-03-09: 8 [IU] via SUBCUTANEOUS
  Administered 2022-03-09: 3 [IU] via SUBCUTANEOUS
  Administered 2022-03-10: 8 [IU] via SUBCUTANEOUS
  Administered 2022-03-10: 5 [IU] via SUBCUTANEOUS
  Administered 2022-03-10: 2 [IU] via SUBCUTANEOUS
  Administered 2022-03-10: 15 [IU] via SUBCUTANEOUS
  Administered 2022-03-11 (×3): 2 [IU] via SUBCUTANEOUS
  Administered 2022-03-11 – 2022-03-12 (×2): 5 [IU] via SUBCUTANEOUS
  Administered 2022-03-12 (×2): 2 [IU] via SUBCUTANEOUS
  Administered 2022-03-12: 5 [IU] via SUBCUTANEOUS
  Administered 2022-03-13: 3 [IU] via SUBCUTANEOUS

## 2022-03-01 MED ORDER — TRACE MINERALS CU-MN-SE-ZN 300-55-60-3000 MCG/ML IV SOLN
INTRAVENOUS | Status: AC
  Filled 2022-03-01: qty 1000

## 2022-03-01 NOTE — Progress Notes (Signed)
   RN concerned patient in distress  Wentn to bedside  - she is eating  -s ays she feels fine - says the machine alarms are annoying  - says labored becuaes of eating  -  Obje Mildly more labored while eating Rt chest tube still draining CXR - not worse., CT can be seen   A Lasix per triad Abg per triad D/w Dr Maren [place] and elink sign out - if resp distress- get cxr - worsening effusion or tube kinked - need another CT. No reversible causes of dyspnea- morphine/bippa     SIGNATURE    Dr. Brand Males, M.D., F.C.C.P,  Pulmonary and Critical Care Medicine Staff Physician, [place] Director - Interstitial Lung Disease  Program  Medical Director - [facility] [place] at [place], [place], [address]  NPI Number:  NPI #[id number] [facility] Number: [id number]  Pager: [phone], If no answer  -[place] or Try [phone] Telephone (clinical office): [phone] Telephone (research): [phone]  5:47 PM 03/01/2022

## 2022-03-01 NOTE — Progress Notes (Signed)
PROGRESS NOTE [name]  ZHY:[id number] DOB: 25-Nov-1960 DOA: 03/09/2022 PCP: Lori Larsson, NP   Brief Narrative/Hospital Course: 4 yof  w/ CLL, depression, anxiety, HLD, HTN presented to [facility] ED on 6/2 unresponsive. On 6/2 patient called EMS for abdominal pain.  Patient initially was alert and talking but became unresponsive with AMS, hypoxic with agonal breathing and was placed on NRB, was noted to be hypotensive.In ED at [facility] remained unresponsive with agonal respirations and was intubated for airway protection and hypoxia.BP initially 81/65.  Given IV fluids.  Started on levo.  Fever 100.9 F and WBC 104.4.  Started on cefepime, Flagyl, Vanco.  Cultures pending.  Glucose 198, BNP 534, troponin 109 then 112, LA 4.4 then 1.8, UA unremarkable, UDS negative.  CXR with large left pleural effusion and small right pleural effusion.  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  CT abdomen with ascending colitis constipation, small intestine wall thickening possibly related to gastroenteritis; small volume ascites; extensive abdominopelvic and inguinal adenopathy.  PCCM consulted for ICU admission and transferred to [facility]. 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  Has been off pressors and subsequently transferred to [facility] service 6/6 Her encephalopathy was probably due to hypercarbia followed by oncology PCCM. 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  Chest tube continues to have a chylothorax managed by pulmonary  She has had recurrent chylothorax with ongoing chest tube and patient transferred to [facility] long 02/27/22 started radiation treatment to see whether this will help with lymphadenopathy from CLL. 6/15-slid up from the chair while trying to get up-PICC line got dislodged and reinserted no bruising- coccyx,l-spine xray neg.   Subjective: Seen and examined this morning.  Resting comfortably On nasal cannula oxygen for comfort. Overnight transferred to SDU for close monitoring due to multiple rapid responses as Chest tube was kinked and dressings have been changed multiple times. Chest tube with chylothorax-output  1400cc> 1000cc> 660/past 24 hr  Assessment and Plan: Principal Problem:   Acute respiratory failure with hypoxia (HCC) Active Problems:   Sepsis ([place])   CLL (chronic lymphocytic leukemia) (HCC)   Shock ([place])   Chronic diastolic heart failure ([place])   DNR (do not resuscitate)   Protein-calorie malnutrition, severe   Pressure injury of skin   Bilateral chylothorax   Small cell B-cell lymphoma (HCC)   Leukocytosis   DM type 2 (diabetes mellitus, type 2) ([place])  Acute respiratory failure with hypoxia-resolved.   Bilateral chylothorax, w/ persistent output: Being managed with chest tube in place by pulmonary.  Overnight problems with kinking transferred to stepdown.  Continue octreotide 50 mcg tid in an attempt to slow chyle production.  Monitor chest tube dressing-defer further chest tube planning to pulmonary team who are following closely and input appreciated.  Sepsis/Shock/Hypotension poa-vitals stable.S/p 5 days of antibiotic, no clear source was identified   CLL/SLL Significant leukocytosis: Oncology following.  Continue pulse prednisone 50 mg daily started XRT 6/15, rituximab is being considered.Has persistent leukocytosis in the setting  of CLL and also steroid.   He is afebrile.  Monitor CBC.   Chronic diastolic heart failure: Compensated.Net IO Since Admission: -7,275.44 mL [03/01/22 0757] . Filed Weights   02/16/2022 0744 02/26/22 2314 03/01/22 0251  Weight: 51.9 kg 50.6 kg 50.3 kg    Hyperkalemia resolved after adjusting TPN.    T2DM, new onset A1c 6.5, w/ uncontrolled hyperglycemia- and w/ hypoglycemia 64 on 6/14 pm after holding TPN.  Fairly controlled continue Semglee 18 units with SSI while on TPN Recent Labs  Lab 02/28/22 1232 02/28/22 1723 02/28/22 2011 02/28/22 2340 03/01/22 0322  GLUCAP 120* 212* 260* 150* 153*    Protein-calorie malnutrition, severe: Augment nutritional status, cont ptn, cont supplement Nutrition Status: Nutrition Problem: Severe Malnutrition Etiology: chronic illness (CLL) Signs/Symptoms: severe muscle depletion, severe fat depletion Interventions: TPN  Deconditioning/debility need PT OT once chest tube is out. Pt slided to floor-unwitnessed from [facility]- during fire alarm-was seen at bedside, reports she was standing from the wheelchair trying to pee and fell on floor, did not inform transporter or nursing staff prior to standing- she had some tailbone pain after falling on bottom, x-ray coccyx/lumbar area neg. complains of pain only at the site of chest tube otherwise none.   DVT prophylaxis: enoxaparin (LOVENOX) injection 40 mg Start: 02/16/22 1515 Code Status:   Code Status: DNR Family Communication: plan of care discussed with patient/family on phone Patient status is: inpatient because of need for xrt and CLL management. Level of care: Stepdown   Dispo: The patient is from: Home, independent.            Anticipated disposition: TBD, needs PT OT evaluation once respiratory status chest tube stable Mobility Assessment (last 72 hours)     Mobility Assessment     Row Name 02/28/22 0900 02/27/22 2015 02/27/22 0905 02/26/22 2347 02/26/22 2033   Does patient have an order for bedrest or is patient medically  unstable No - Continue assessment No - Continue assessment No - Continue assessment No - Continue assessment No - Continue assessment   What is the highest level of mobility based on the progressive mobility assessment? Level 2 (Chairfast) - Balance while sitting on edge of bed and cannot stand Level 4 (Walks with assist in room) - Balance while marching in place and cannot step forward and back - Complete Level 4 (Walks with assist in room) - Balance while marching in place and cannot step forward and back - Complete Level 3 (Stands with assist) - Balance while standing  and cannot march in place Level 3 (Stands with assist) - Balance while standing  and cannot march in place   Is the above level different from baseline mobility prior to current illness? Yes - Recommend PT order Yes - Recommend PT order Yes - Recommend PT order Yes - Recommend PT order Yes - Recommend PT order    [place] Name 02/26/22 0800           Does patient have an order for bedrest or is patient medically unstable No - Continue assessment       What is the highest level of mobility based on the progressive mobility assessment? Level 3 (Stands with assist) - Balance while standing  and cannot march in place       Is the above level different from baseline mobility prior to current illness? Yes - Recommend PT order                 Objective: Vitals last  24 hrs: Vitals:   03/01/22 0300 03/01/22 0400 03/01/22 0500 03/01/22 0600  BP: 106/60 (!) 89/47 102/60 109/69  Pulse: 80 83 89 98  Resp: (!) 22 20 (!) 28 (!) 32  Temp:  98.5 F (36.9 C)    TempSrc:  Oral    SpO2: 100% 97% 97% 100%  Weight:      Height:       Weight change:   Physical Examination: General exam: AA,older than stated age, weak appearing. HEENT:Oral mucosa moist, Ear/Nose WNL grossly, dentition normal. Respiratory system: b/l clear w/ chest tube in right posterior chest wall dressing in place  Cardiovascular system: S1 & S2 +, No  JVD,. Gastrointestinal system: Abdomen soft,NT,ND, BS+ Nervous System:Alert, awake, moving extremities and grossly nonfocal Extremities: edema neg,distal peripheral pulses palpable.  Skin: No rashes,no icterus. MSK: Thin muscle bulk,tone, power  Medications reviewed: Scheduled Meds:  chlorhexidine gluconate (MEDLINE KIT)  15 mL Mouth Rinse BID   Chlorhexidine Gluconate Cloth  6 each Topical Daily   docusate sodium  100 mg Oral BID   enoxaparin (LOVENOX) injection  40 mg Subcutaneous Q24H   feeding supplement  1 Container Oral BID BM   insulin aspart  0-9 Units Subcutaneous Q4H   insulin glargine-yfgn  8 Units Subcutaneous Daily   octreotide  50 mcg Subcutaneous TID   polyethylene glycol  17 g Oral Daily   predniSONE  50 mg Oral Q breakfast   sodium chloride flush  10 mL Other Q8H   sodium chloride flush  10-40 mL Intracatheter Q12H   Continuous Infusions:  sodium chloride 10 mL/hr at 03/01/22 0737   TPN ADULT (ION) 100 mL/hr at 03/01/22 6063   Diet Order             Diet regular Room service appropriate? Yes; Fluid consistency: Thin  Diet effective now                    Nutrition Problem: Severe Malnutrition Etiology: chronic illness (CLL) Signs/Symptoms: severe muscle depletion, severe fat depletion Interventions: TPN   Intake/Output Summary (Last 24 hours) at 03/01/2022 0755 Last data filed at 03/01/2022 0737 Gross per 24 hour  Intake 1947.66 ml  Output 2210 ml  Net -262.34 ml   Net IO Since Admission: -7,275.44 mL [03/01/22 0755]  Wt Readings from Last 3 Encounters:  03/01/22 50.3 kg  03/08/20 64.4 kg  02/07/20 68.5 kg     Unresulted Labs (From admission, onward)     Start     Ordered   03/02/22 0500  CBC  Daily,   R     Question:  Specimen collection method  Answer:  Unit=Unit collect   03/01/22 0729   02/27/22 0500  Comprehensive metabolic panel  (TPN Lab Panel)  Every Mon,Thu (0500),   R     Question:  Specimen collection method  Answer:   Unit=Unit collect   02/25/22 1228   02/27/22 0500  Magnesium  (TPN Lab Panel)  Every Mon,Thu (0500),   R     Question:  Specimen collection method  Answer:  Unit=Unit collect   02/25/22 1228   02/27/22 0500  Phosphorus  (TPN Lab Panel)  Every Mon,Thu (0500),   R     Question:  Specimen collection method  Answer:  Unit=Unit collect   02/25/22 1228   02/27/22 0500  Triglycerides  (TPN Lab Panel)  Every Mon,Thu (0500),   R     Question:  Specimen collection method  Answer:  Unit=Unit collect  02/25/22 1228   02/25/22 1700  Vitamin A  Once,   R       Question:  Specimen collection method  Answer:  Unit=Unit collect   02/25/22 1343   02/25/22 1700  Zinc  Once,   R       Question:  Specimen collection method  Answer:  Unit=Unit collect   02/25/22 1343   02/17/2022 1442  Acid Fast Culture with reflexed sensitivities  (AFB smear + Culture w reflexed sensitivities panel)  Once,   R       See Hyperspace for full Linked Orders Report.   02/22/2022 1441          Data Reviewed: I have personally reviewed following labs and imaging studies CBC: Recent Labs  Lab 02/23/22 0031 02/23/2022 0413 02/25/22 0056 02/26/22 0950 02/27/22 0613  WBC 104.7* 106.3* 92.1* 86.8* 98.2*  NEUTROABS 5.2 5.4 7.0 6.1 7.1  HGB 13.0 12.3 11.0* 11.2* 12.3  HCT 40.4 39.5 36.3 35.8* 39.6  MCV 91.0 91.6 93.3 93.7 91.7  PLT 403* 498* 474* 508* 270*   Basic Metabolic Panel: Recent Labs  Lab 02/25/22 0056 02/26/22 0341 02/27/22 0613 02/28/22 0422 02/28/22 0727 02/28/22 2032 03/01/22 0355  NA 137 138 137 135  --   --  136  K 5.1 4.7 4.7 5.6* 5.6* 4.4 3.8  CL 102 101 103 100  --   --  103  CO2 30 33* 30 29  --   --  31  GLUCOSE 148* 128* 93 187*  --   --  158*  BUN 20 21 28* 27*  --   --  27*  CREATININE 0.43* 0.45 0.49 0.43*  --   --  0.37*  CALCIUM 7.8* 8.1* 8.1* 7.5*  --   --  7.5*  MG 1.9 2.0 2.0 1.8  --   --  2.0  PHOS  --  3.2 1.9* 2.6  --   --  3.1   GFR: Estimated Creatinine Clearance: 58.6 mL/min  (A) (by C-G formula based on SCr of 0.37 mg/dL (L)). Liver Function Tests: Recent Labs  Lab 02/26/22 0341 02/27/22 0613  AST 36 33  ALT 39 35  ALKPHOS 69 71  BILITOT 0.3 0.3  PROT 4.6* 4.9*  ALBUMIN 2.0* 2.2*   No results for input(s): "LIPASE", "AMYLASE" in the last 168 hours. No results for input(s): "AMMONIA" in the last 168 hours. Coagulation Profile: No results for input(s): "INR", "PROTIME" in the last 168 hours. BNP (last 3 results) No results for input(s): "PROBNP" in the last 8760 hours. HbA1C: No results for input(s): "HGBA1C" in the last 72 hours. CBG: Recent Labs  Lab 02/28/22 1232 02/28/22 1723 02/28/22 2011 02/28/22 2340 03/01/22 0322  GLUCAP 120* 212* 260* 150* 153*   Lipid Profile: Recent Labs    02/27/22 0613  TRIG 108   Thyroid Function Tests: No results for input(s): "TSH", "T4TOTAL", "FREET4", "T3FREE", "THYROIDAB" in the last 72 hours. Sepsis Labs: No results for input(s): "PROCALCITON", "LATICACIDVEN" in the last 168 hours.  Recent Results (from the past 240 hour(s))  Body fluid culture w Gram Stain     Status: None   Collection Time: 02/19/2022  2:35 PM   Specimen: Pleural Fluid  Result Value Ref Range Status   Specimen Description PLEURAL  Final   Special Requests NONE  Final   Gram Stain   Final    FEW WBC PRESENT, PREDOMINANTLY PMN NO ORGANISMS SEEN    Culture   Final  NO GROWTH 3 DAYS Performed at [facility], [address] [place], [place] [address]    Report Status 02/28/2022 FINAL  Final  Fungus Culture With Stain     Status: None (Preliminary result)   Collection Time: 02/19/2022  2:35 PM   Specimen: Pleural Fluid  Result Value Ref Range Status   Fungus Stain Final report  Final    Comment: (NOTE) Performed At: [facility] [address] [place], [place] [id number] Jolene Schimke MD BF:[id number]    Fungus (Mycology) Culture PENDING  Incomplete   Fungal Source PLEURAL  Final    Comment: Performed at  [facility], [address], [place], [place] [address]  Acid Fast Smear (AFB)     Status: None   Collection Time: 03/11/2022  2:35 PM   Specimen: Pleural, Left  Result Value Ref Range Status   AFB Specimen Processing Concentration  Final   Acid Fast Smear Negative  Final    Comment: (NOTE) Performed At: [facility] [address] [place], [place] [id number] Jolene Schimke MD JJ:[id number]    Source (AFB) PLEURAL  Final    Comment: Performed at [facility], [address], [place], [place] [address]  Fungus Culture Result     Status: None   Collection Time: 02/19/2022  2:35 PM  Result Value Ref Range Status   Result 1 Comment  Final    Comment: (NOTE) KOH/Calcofluor preparation:  no fungus observed. Performed At: [facility] [address] [place], [place] [id number] Jolene Schimke MD [id number]     Antimicrobials: Anti-infectives (From admission, onward)    Start     Dose/Rate Route Frequency Ordered Stop   02/17/22 1600  metroNIDAZOLE (FLAGYL) IVPB 500 mg        500 mg 100 mL/hr over 60 Minutes Intravenous Every 12 hours 02/17/22 0713 02/18/22 1926   02/16/22 1600  ceFEPIme (MAXIPIME) 2 g in sodium chloride 0.9 % 100 mL IVPB        2 g 200 mL/hr over 30 Minutes Intravenous Every 12 hours 02/16/22 0818 02/18/22 1847   02/16/22 1500  metroNIDAZOLE (FLAGYL) IVPB 500 mg  Status:  Discontinued        500 mg 100 mL/hr over 60 Minutes Intravenous Every 12 hours 02/16/22 1404 02/17/22 0711   02/15/22 1800  vancomycin (VANCOREADY) IVPB 1250 mg/250 mL  Status:  Discontinued        1,250 mg 166.7 mL/hr over 90 Minutes Intravenous Every 24 hours 02/25/2022 1955 02/15/22 0737   02/15/22 1415  metroNIDAZOLE (FLAGYL) IVPB 500 mg  Status:  Discontinued        500 mg 100 mL/hr over 60 Minutes Intravenous Every 12 hours 02/15/22 1325 02/16/22 0840   02/15/22 0400  ceFEPIme (MAXIPIME) 2 g in sodium chloride 0.9 % 100 mL IVPB  Status:  Discontinued        2  g 200 mL/hr over 30 Minutes Intravenous Every 8 hours 03/02/2022 1955 02/16/22 0817   02/21/2022 2000  vancomycin (VANCOREADY) IVPB 1250 mg/250 mL  Status:  Discontinued        1,250 mg 166.7 mL/hr over 90 Minutes Intravenous Every 24 hours 02/24/2022 1952 02/13/2022 1955   03/02/2022 1945  ceFEPIme (MAXIPIME) 2 g in sodium chloride 0.9 % 100 mL IVPB        2 g 200 mL/hr over 30 Minutes Intravenous  Once 03/10/2022 1931 02/25/2022 2051   02/26/2022 1945  metroNIDAZOLE (FLAGYL) IVPB 500 mg  500 mg 100 mL/hr over 60 Minutes Intravenous  Once 03/12/2022 1931 03/04/2022 2053   03/11/2022 1945  vancomycin (VANCOCIN) IVPB 1000 mg/200 mL premix  Status:  Discontinued        1,000 mg 200 mL/hr over 60 Minutes Intravenous  Once 03/10/2022 1931 02/21/2022 2053      Culture/Microbiology    Component Value Date/Time   SDES PLEURAL 02/19/2022 1435   SPECREQUEST NONE 02/22/2022 1435   CULT  02/28/2022 1435    NO GROWTH 3 DAYS Performed at [facility], [place] [address] [place], [place] [address]    REPTSTATUS 02/24/2022 FINAL 03/06/2022 1435    Other culture-see note  Radiology Studies: DG Chest Port 1 View  Result Date: 02/28/2022 CLINICAL DATA:  Pleural effusion EXAM: PORTABLE CHEST 1 VIEW COMPARISON:  Previous studies including the examination done earlier today FINDINGS: There is large left pleural effusion obscuring left mid and left lower lung fields. There is minimal blunting of right lateral CP angle. Right chest tube is noted with its tip in the medial right lower lung fields. Proximal portion of the right pigtail chest tube appears to be coiled possibly in the pleural space or chest wall. This finding has not changed. There is no pneumothorax. Visualized lung fields show no signs of pulmonary edema or focal consolidation. Possible subsegmental atelectasis is seen in the right lower lung fields. Evaluation of left mid and left lower lung fields for atelectasis/pneumonia is limited by the effusion. Tip of  PICC line introduced through the left upper extremity is seen in the region of junction of superior vena cava and right atrium. IMPRESSION: Large left pleural effusion has not changed significantly. Small right pleural effusion. Pigtail right chest tube. Proximal portion of the chest tube is coiled, possibly in the pleural space or chest wall. There is no pneumothorax. Electronically Signed   By: Elmer Picker M.D.   On: 02/28/2022 19:54   DG Chest 1 View  Result Date: 02/28/2022 CLINICAL DATA:  Chest crackles.  Encounter for chest tube placement. EXAM: CHEST  1 VIEW COMPARISON:  AP chest 02/27/2022 FINDINGS: New left upper extremity PICC line tip overlies the superior vena cava/right atrial junction. Moderate-to-large left pleural effusion is unchanged. The right lung is well aerated.  No definite right pleural effusion. Right lower thorax pigtail drainage catheter is at a similar height. No pneumothorax is seen. Right axillary surgical clips. IMPRESSION: One. No significant change in moderate-to-large left pleural effusion. No significant change in position of right basilar pigtail drainage catheter. No significant right pleural effusion. No right pneumothorax is seen. Electronically Signed   By: Yvonne Kendall M.D.   On: 02/28/2022 17:40   DG Lumbar Spine 2-3 Views  Result Date: 02/28/2022 CLINICAL DATA:  Multiple falls. Low back and tailbone pain. EXAM: LUMBAR SPINE - 2-3 VIEW COMPARISON:  Abdominopelvic CT 03/04/2022. FINDINGS: Five lumbar type vertebral bodies. The alignment is normal. No evidence of acute fracture or pars defect. There is multilevel spondylosis with disc space narrowing and endplate osteophyte formation at the lower 3 levels. Mild facet degenerative changes are present. There is aortic atherosclerosis. IMPRESSION: No evidence of acute lumbar spine fracture or malalignment. Mild spondylosis. Electronically Signed   By: Richardean Sale M.D.   On: 02/28/2022 15:22   DG  Sacrum/Coccyx  Result Date: 02/28/2022 CLINICAL DATA:  Multiple falls.  Low back and tailbone pain. EXAM: SACRUM AND COCCYX - 2+ VIEW COMPARISON:  Pelvic CT 03/05/2022. FINDINGS: The bones appear mildly demineralized. No  evidence of acute fracture or sacroiliac joint diastasis. The symphysis pubis appears normal. Scattered pelvic calcifications, similar to previous CT and likely phleboliths. IMPRESSION: No evidence of acute sacrococcygeal fracture. Electronically Signed   By: Richardean Sale M.D.   On: 02/28/2022 15:20   DG Chest 1 View  Result Date: 02/27/2022 CLINICAL DATA:  Chest crackles.  Chest tube placement. EXAM: CHEST  1 VIEW COMPARISON:  Earlier same day.  02/26/2022. FINDINGS: No visible pleural fluid on the right. No right pneumothorax. Mild atelectasis at the right lung base. Pleural catheter overlies the lower thoracic midline. Large left pleural effusion is unchanged with collapse of the left lower lobe. Skin fold present on the left. Right arm PICC has been removed. IMPRESSION: Pleural catheter projected over the midline. No visible right effusion or right pneumothorax. Persistent large effusion on the left with left lower lobe collapse. Electronically Signed   By: Nelson Chimes M.D.   On: 02/27/2022 11:48   [place] EKG SITE RITE  Result Date: 02/27/2022 If Site Rite image not attached, placement could not be confirmed due to current cardiac rhythm.    LOS: 15 days   Antonieta Pert, MD [facility]  03/01/2022, 7:55 AM

## 2022-03-01 NOTE — Progress Notes (Signed)
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  Assessment: 61 year old female with pertinent PMH of CLL, depression, anxiety, HLD, HTN presents to [facility] ED on 6/2 unresponsive.  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 Patient also has bilateral chylothorax with high output.  Pharmacy has been consulted for TPN.  Glucose / Insulin: new onset DM2, A1c 6.5  On sSSI q4h, semglee 8 units daily - used 16 units of SSI past 24 hrs - CBGs slight bump on D10, but improved after TPN resumed - CBGs (goal <150) - on prednisone 50 mg daily  Electrolytes: K improved following removal from TPN Renal: Scr < 1, BUN slightly elevated at 27 Hepatic: LFTs wnl - TG 108 - albumin low 2.2 Intake / Output: -2.8 L - Chest tube ~ 600 ml in 24 hrs -PO intake 480 ml MIVF:  none  GI Imaging: 6/10 CT Chest Bilateral large pleural effusions and bulky axillary and lower cervical lymphadenopathy  GI Surgeries / Procedures: None this admission  Central access: PICC placed 6/15 TPN start date: 6/13  Nutritional Goals: Goal TPN rate is 100 mL/hr (provides 150 g of protein and 2357 kcals per day)  RD Assessment: Estimated Needs Total Energy Estimated Needs: 2300-2600 Total Protein Estimated Needs: 140-160 grams Total Fluid Estimated Needs: >2.5 L  Current Nutrition:  -  Low fat diet, no more than 7 g fat per meal - Boost/resource breeze BID -  TPN  Significant Events:  - 6/14: TPN turned off at ~10:30p for transport to [facility]. D10 started while off TPN - 6/16 am TPN off/on D10 for K 5.6, TPN resumed 1800  Plan:  - Continue TPN at 100 ml/hr - Electrolytes in TPN: K improved and now trending down after removal from TPN 6/16 - will add small amount of TPN back to bag  to start tonight (total 40 mEq) Na 50 mEq/L K  17 mEq/L Ca 5 mEq/L Mg 5 mEq/L Phos 10 mmol/L Cl:Ac 1:2 - Add standard MVI and trace elements to TPN - Continue Semglee 8 units daily - Continue Sensitive q4h SSI and adjust as needed - Monitor TPN labs on Mon/Thurs - Bmet 6/18 to monitor potassium  Tawnya Crook, PharmD, BCPS Clinical Pharmacist 03/01/2022 8:20 AM

## 2022-03-01 NOTE — Progress Notes (Signed)
NAME:  [name], MRN:  [id number], DOB:  06-07-1961, LOS: 73 ADMISSION DATE:  03/03/2022, CONSULTATION DATE:  6/2 REFERRING MD:  Dr. Alvino Chapel, CHIEF COMPLAINT:  Unresponsive; Acute resp failure   BRIEF  Patient is a 62 year old female with pertinent PMH of CLL, depression, anxiety, HLD, HTN presents to [facility] ED on 6/2 unresponsive.  On 6/2 patient called EMS for abdominal pain.  Patient initially was alert and talking but became unresponsive with AMS.  Patient was hypoxic with agonal breathing and was placed on NRB.  Patient was noted to be hypotensive.  Patient transported to [facility] ED.  Upon arrival to [facility] ED patient remained unresponsive with agonal respirations.  Patient intubated for airway protection and hypoxia.  BP initially 81/65.  Given IV fluids.  Started on levo.  Fever 100.9 F and WBC 104.4.  Started on cefepime, Flagyl, Vanco.  Cultures pending.  Glucose 198, BNP 534, troponin 109 then 112, LA 4.4 then 1.8, UA unremarkable, UDS negative.  CXR with large left pleural effusion and small right pleural effusion.  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  CT abdomen with ascending colitis constipation, small intestine wall thickening possibly related to gastroenteritis; small volume ascites; extensive abdominopelvic and inguinal adenopathy.   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 pylori infection (2/09), HAND PAIN, BILATERAL (01/29/2009), Hypertension, Iron deficiency (11/17/2016), Lymphadenopathy (01/17/2016), Medical non-compliance (08/22/2017), Nicotine addiction, Psychosis (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   has a past surgical history that includes Surgery of left middle finger on left hand (childhood ); Tubal ligation; Cholecystectomy; Colonoscopy (02/07/2011); Hammer toe surgery (Left); Axillary lymph node biopsy (Right, 03/28/2016); Hysteroscopy with D & C (N/A, 07/30/2016); Thoracentesis (Left, 03/11/2022); and Chest tube insertion (N/A, 02/17/2022).     Significant Hospital Events: Including procedures, antibiotic start and stop dates in addition to other pertinent events   6/2: Patient admitted to [facility] ED unresponsive; intubated; shock started on levo; transferred to [facility]. CT chest and abd: 1. Extensive abdominopelvic and inguinal adenopathy, most likely related to lymphoma/leukemia. Numerous small hypodense splenic lesions versus bolus phase phenomenon. Intrahepatic periportal edema which is probably either due to fluid overload or hepatic dysfunction. The portal vein is patent within normal caliber limits. Chronic calcific pancreatitis but no findings of acute pancreatitis or ductal dilatation.  Ascending colitis versus nondistention, remainder of the colon with constipation. Diffuse small intestinal wall thickening with gastric fold thickening, could relate to gastroenteritis or hepatic dysfunction versus reactive from ascites versus congestive or infiltrating disease. 6/3 right chest tube placed. Left thora 2000 ml removed - milky 6/4 extubated -exudative, cx negative, lymphocytic. 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 Off pressors 6/6 Transferred to [facility] 6/6 ultrasound-guided right axillary lymph node biopsy -Started on pulse dose steroids at 50 mg prednisone 6/7 chest tube output over a liter 6/8 left thoracentesis for 1400 cc of fluid 6/10 - chest tube removed 6/12 - s/p right chest tube placement with 3.1 L drained  6/3 - Transferred from [facility] to [facility] 6/13 for initiation of palliative radiotherapy 02/26/22 - No chest pain. Saturation 96% on 2 L.  Chest tube drained 190 cc  of milky fluid 02/27/22 - now at [facility] long hospital bed 1610. Per RN - fall earlier today . CXR stable. Doing well. Got SIM for XRT.  On room air. HAs Chest tube right side draining chylothorax. Has mod-large effusion on the left side -refused chest tube.  Remains on octreiotice 6/16 - on TPN.  On [place] O2 . > 1000 cc rt sided chest tube output.  pulse prednisone 50 mg daily started XRT 6/15, rituximab is being considered  Interim History / Subjective:    6/17 -she is stable with the same amount of oxygen but right-sided chest tube is somewhat withdrawn and also cannot.  Nevertheless it still draining chylothorax.  She feels the same.  She is unsure if she wants chest tube reinserted on the right side if this chest tube was to follow-up.  Definitely does not want chest tube on the left side.  Objective   Blood pressure 101/70, pulse 97, temperature 98.6 F (37 C), temperature source Oral, resp. rate (!) 35, height '5\' 5"'$  (1.651 m), weight 50.3 kg, last menstrual period 07/25/2016, SpO2 99 %.        Intake/Output Summary (Last 24 hours) at 03/01/2022 1202 Last data filed at 03/01/2022 0737 Gross per 24 hour  Intake 1707.66 ml  Output 2210 ml  Net -502.34 ml   Filed Weights   03/12/2022 0744 02/26/22 2314 03/01/22 0251  Weight: 51.9 kg 50.6 kg 50.3 kg   Physical Exam: Frail female on oxygen.  Right-sided chest tube present.  She is sitting and conversing normally extremely cachectic.  Right-sided chest tube in the infrascapular area with dressing on it.  The chest tube is connected to his heart and is draining.  Clear to auscultation bilaterally.  Normal heart sounds abdomen soft no cyanosis no clubbing no edema.    Assessment & Plan:  Bilateral chylothorax Acute hypoxic respiratory failure due to above  Left effusion is wors over time - refused left chst tube  Right chest tube   -03/01/2022: Somewhat dislodged and kinked but continues to drain continues to  drain  Plan  - continuie  octreotide to 50 mcg 3 times daily  in attempt to slow chyle production  - hopefully chemo helps  - o2 for pulze ox > 92% (racial pulsd ox gap) -Continue chest tube to thyroid drain  -Try to maintain its position but if it dislodges conversation needs we have with the patient about reinsertion.  = Chest tube maintenance per nursing protocol  Protein calorie malnutrition, moderate to severe - per triad  CLL/SLL -Per oncology, on prednisone Rituximab being considered  Chronic HFpEF due to uncontrolled hypertension  Per triad      SIGNATURE    Dr. Brand Males, M.D., F.C.C.P,  Pulmonary and Critical Care Medicine Staff Physician, [place] Director - Interstitial Lung Disease  Program  Medical Director - [facility] [place] at [place], [place], [address]  NPI Number:  NPI #[id number] DEA Number: [id number]  Pager: [phone], If no answer  -> Check AMION or Try [place] Telephone (clinical office): [phone] Telephone (research): [phone]  12:02 PM 03/01/2022    LABS    PULMONARY No results for input(s): "PHART", "PCO2ART", "PO2ART", "HCO3", "TCO2", "O2SAT" in the last 168 hours.  Invalid input(s): "PCO2", "PO2"  CBC Recent Labs  Lab 02/25/22 0056 02/26/22 0950 02/27/22 0613  HGB 11.0* 11.2* 12.3  HCT 36.3 35.8* 39.6  WBC 92.1* 86.8* 98.2*  PLT 474* 508* 457*    COAGULATION No results for input(s): "INR" in the last 168 hours.  CARDIAC  No results for input(s): "TROPONINI" in the last 168 hours. No results for input(s): "PROBNP" in the last 168 hours.   CHEMISTRY Recent Labs  Lab 02/25/22 0056 02/26/22 0341 02/27/22 2595 02/28/22 0422 02/28/22 0727 02/28/22 2032 03/01/22 0355  NA 137 138 137 135  --   --  136  K 5.1 4.7 4.7 5.6*   < > 4.4 3.8  CL 102 101 103 100  --   --  103  CO2 30 33* 30 29  --   --  31  GLUCOSE  148* 128* 93 187*  --   --  158*  BUN 20 21 28* 27*  --   --  27*  CREATININE 0.43* 0.45 0.49 0.43*  --   --  0.37*  CALCIUM 7.8* 8.1* 8.1* 7.5*  --   --  7.5*  MG 1.9 2.0 2.0 1.8  --   --  2.0  PHOS  --  3.2 1.9* 2.6  --   --  3.1   < > = values in this interval not displayed.   Estimated Creatinine Clearance: 58.6 mL/min (A) (by C-G formula based on SCr of 0.37 mg/dL (L)).   LIVER Recent Labs  Lab 02/26/22 0341 02/27/22 0613  AST 36 33  ALT 39 35  ALKPHOS 69 71  BILITOT 0.3 0.3  PROT 4.6* 4.9*  ALBUMIN 2.0* 2.2*     INFECTIOUS No results for input(s): "LATICACIDVEN", "PROCALCITON" in the last 168 hours.   ENDOCRINE CBG (last 3)  Recent Labs    02/28/22 2340 03/01/22 0322 03/01/22 1141  GLUCAP 150* 153* 227*         IMAGING x48h  - image(s) personally visualized  -   highlighted in bold DG Chest Port 1 View  Result Date: 02/28/2022 CLINICAL DATA:  Pleural effusion EXAM: PORTABLE CHEST 1 VIEW COMPARISON:  Previous studies including the examination done earlier today FINDINGS: There is large left pleural effusion obscuring left mid and left lower lung fields. There is minimal blunting of right lateral CP angle. Right chest tube is noted with its tip in the medial right lower lung fields. Proximal portion of the right pigtail chest tube appears to be coiled possibly in the pleural space or chest wall. This finding has not changed. There is no pneumothorax. Visualized lung fields show no signs of pulmonary edema or focal consolidation. Possible subsegmental atelectasis is seen in the right lower lung fields. Evaluation of left mid and left lower lung fields for atelectasis/pneumonia is limited by the effusion. Tip of PICC line introduced through the left upper extremity is seen in the region of junction of superior vena cava and right atrium. IMPRESSION: Large left pleural effusion has not changed significantly. Small right pleural effusion. Pigtail right chest tube.  Proximal portion of the chest tube is coiled, possibly in the pleural space or chest wall. There is no pneumothorax. Electronically Signed   By: Elmer Picker M.D.   On: 02/28/2022 19:54   DG Chest 1 View  Result Date: 02/28/2022 CLINICAL DATA:  Chest crackles.  Encounter for chest tube placement. EXAM: CHEST  1 VIEW COMPARISON:  AP chest 02/27/2022 FINDINGS: New left upper extremity PICC line tip overlies the superior vena cava/right atrial junction. Moderate-to-large left pleural effusion is unchanged. The right lung is well aerated.  No definite right pleural effusion.  Right lower thorax pigtail drainage catheter is at a similar height. No pneumothorax is seen. Right axillary surgical clips. IMPRESSION: One. No significant change in moderate-to-large left pleural effusion. No significant change in position of right basilar pigtail drainage catheter. No significant right pleural effusion. No right pneumothorax is seen. Electronically Signed   By: Yvonne Kendall M.D.   On: 02/28/2022 17:40   DG Lumbar Spine 2-3 Views  Result Date: 02/28/2022 CLINICAL DATA:  Multiple falls. Low back and tailbone pain. EXAM: LUMBAR SPINE - 2-3 VIEW COMPARISON:  Abdominopelvic CT 03/09/2022. FINDINGS: Five lumbar type vertebral bodies. The alignment is normal. No evidence of acute fracture or pars defect. There is multilevel spondylosis with disc space narrowing and endplate osteophyte formation at the lower 3 levels. Mild facet degenerative changes are present. There is aortic atherosclerosis. IMPRESSION: No evidence of acute lumbar spine fracture or malalignment. Mild spondylosis. Electronically Signed   By: Richardean Sale M.D.   On: 02/28/2022 15:22   DG Sacrum/Coccyx  Result Date: 02/28/2022 CLINICAL DATA:  Multiple falls.  Low back and tailbone pain. EXAM: SACRUM AND COCCYX - 2+ VIEW COMPARISON:  Pelvic CT 03/05/2022. FINDINGS: The bones appear mildly demineralized. No evidence of acute fracture or sacroiliac  joint diastasis. The symphysis pubis appears normal. Scattered pelvic calcifications, similar to previous CT and likely phleboliths. IMPRESSION: No evidence of acute sacrococcygeal fracture. Electronically Signed   By: Richardean Sale M.D.   On: 02/28/2022 15:20

## 2022-03-01 NOTE — Progress Notes (Signed)
TRH floor coverage for both [facility] and [facility] (remote) on night of 02/28/22 into morning of 03/01/22:    I was contacted by the rapid response RN, who updated me that the patient had undergone multiple rapid responses throughout the day on 02/28/2022, with some of these rapid responses involving some movement of her right-sided chest tube that was placed by critical care on 02/15/2022 in the setting of large chylothorax.  RN conveyed that there is no evidence of acute airleak associated with the chest tube, and that the patient does not appear to be in acute respiratory distress at this time, maintaining O2 sats in the high 90s on room air, with respiratory rate in the low 20s.  Not complaining of any chest pain.  However, given the volume of rapid responses over the last 12 hours for this patient, RN asked if the patient would benefit from a higher level of care relative to med telemetry.  I subsequently placed order to transfer the patient to the stepdown unit, and ordered a chest x-ray to further assess given report of movement of the patient's right-sided chest tube throughout the day.    Babs Bertin, DO Hospitalist

## 2022-03-02 ENCOUNTER — Inpatient Hospital Stay (HOSPITAL_COMMUNITY): Payer: Medicaid Other

## 2022-03-02 DIAGNOSIS — J940 Chylous effusion: Secondary | ICD-10-CM | POA: Diagnosis not present

## 2022-03-02 DIAGNOSIS — J9601 Acute respiratory failure with hypoxia: Secondary | ICD-10-CM | POA: Diagnosis not present

## 2022-03-02 LAB — CBC
HCT: 36 % (ref 36.0–46.0)
Hemoglobin: 11.2 g/dL — ABNORMAL LOW (ref 12.0–15.0)
MCH: 28.8 pg (ref 26.0–34.0)
MCHC: 31.1 g/dL (ref 30.0–36.0)
MCV: 92.5 fL (ref 80.0–100.0)
Platelets: 291 10*3/uL (ref 150–400)
RBC: 3.89 MIL/uL (ref 3.87–5.11)
RDW: 18.4 % — ABNORMAL HIGH (ref 11.5–15.5)
WBC: 84.9 10*3/uL (ref 4.0–10.5)
nRBC: 0 % (ref 0.0–0.2)

## 2022-03-02 LAB — GLUCOSE, CAPILLARY
Glucose-Capillary: 137 mg/dL — ABNORMAL HIGH (ref 70–99)
Glucose-Capillary: 151 mg/dL — ABNORMAL HIGH (ref 70–99)
Glucose-Capillary: 180 mg/dL — ABNORMAL HIGH (ref 70–99)
Glucose-Capillary: 240 mg/dL — ABNORMAL HIGH (ref 70–99)
Glucose-Capillary: 245 mg/dL — ABNORMAL HIGH (ref 70–99)
Glucose-Capillary: 246 mg/dL — ABNORMAL HIGH (ref 70–99)

## 2022-03-02 LAB — BASIC METABOLIC PANEL
Anion gap: 6 (ref 5–15)
BUN: 32 mg/dL — ABNORMAL HIGH (ref 8–23)
CO2: 31 mmol/L (ref 22–32)
Calcium: 7.9 mg/dL — ABNORMAL LOW (ref 8.9–10.3)
Chloride: 102 mmol/L (ref 98–111)
Creatinine, Ser: 0.37 mg/dL — ABNORMAL LOW (ref 0.44–1.00)
GFR, Estimated: >60 mL/min (ref >60)
Glucose, Bld: 156 mg/dL — ABNORMAL HIGH (ref 70–99)
Potassium: 3.1 mmol/L — ABNORMAL LOW (ref 3.5–5.1)
Sodium: 139 mmol/L (ref 135–145)

## 2022-03-02 MED ORDER — TRACE MINERALS CU-MN-SE-ZN 300-55-60-3000 MCG/ML IV SOLN
INTRAVENOUS | Status: DC
  Filled 2022-03-02: qty 1000

## 2022-03-02 MED ORDER — POTASSIUM CHLORIDE CRYS ER 20 MEQ PO TBCR
30.0000 meq | EXTENDED_RELEASE_TABLET | ORAL | Status: AC
  Administered 2022-03-02 (×2): 30 meq via ORAL
  Filled 2022-03-02 (×2): qty 1

## 2022-03-02 MED ORDER — TRACE MINERALS CU-MN-SE-ZN 300-55-60-3000 MCG/ML IV SOLN
INTRAVENOUS | Status: AC
  Filled 2022-03-02: qty 1000

## 2022-03-02 NOTE — Progress Notes (Addendum)
PROGRESS NOTE [name]  GUY:[id number] DOB: Jul 20, 1961 DOA: 02/19/2022 PCP: Noreene Larsson, NP   Brief Narrative/Hospital Course: 87 yof  w/ CLL, depression, anxiety, HLD, HTN presented to [facility] ED on 6/2 unresponsive. On 6/2 patient called EMS for abdominal pain.  Patient initially was alert and talking but became unresponsive with AMS, hypoxic with agonal breathing and was placed on NRB, was noted to be hypotensive.In ED at [facility] remained unresponsive with agonal respirations and was intubated for airway protection and hypoxia.BP initially 81/65.  Given IV fluids.  Started on levo.  Fever 100.9 F and WBC 104.4.  Started on cefepime, Flagyl, Vanco.  Cultures pending.  Glucose 198, BNP 534, troponin 109 then 112, LA 4.4 then 1.8, UA unremarkable, UDS negative.  CXR with large left pleural effusion and small right pleural effusion.  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  CT abdomen with ascending colitis constipation, small intestine wall thickening possibly related to gastroenteritis; small volume ascites; extensive abdominopelvic and inguinal adenopathy.  PCCM consulted for ICU admission and transferred to [facility]. 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  Has been off pressors and subsequently transferred to [facility] service 6/6 Her encephalopathy was probably due to hypercarbia followed by oncology PCCM. 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  Chest tube continues to have a chylothorax managed by pulmonary  She has had recurrent chylothorax with ongoing chest tube and patient transferred to [facility] long 02/27/22 started radiation treatment to see whether this will help with lymphadenopathy from CLL. 6/15-slid up from the chair while trying to get up-PICC line got dislodged and reinserted no bruising- coccyx,l-spine xray neg.   Subjective: Seen and examined this morning.  She had some leakage from the chest tube earlier this morning dressing was applied but later on found that chest tube got dislodged. She is doing well on 2 L nasal cannula tachypnea 29-33, but anxious too but not in distress.  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  Continue the same.  Monitor in the stepdown.  Bilateral chylothorax, w/ persistent output: Being managed with chest tube in place by pulmonary> but got dislodged 6/18 early morning.  Discussed with Dr. Chase Caller, repeat chest x-ray reviewed-await further pulmonary/patient decision whether chest tube needs to be reinserted or not.respiratory status appears stable.  Continue octreotide 50 mcg tid in an attempt to slow chyle production.  Continue supplemental oxygen as #1.  Sepsis/Shock/Hypotension poa-vitals stable.S/p 5 days of antibiotic, no clear source was identified   CLL/SLL Significant  leukocytosis: Oncology following.  Continue pulse prednisone 50 mg daily started XRT 6/15, rituximab is being considered.Has persistent leukocytosis in the setting of CLL and also steroid.  Wbc however appears to be downtrending, oncology aware, afebrile.  Recent Labs  Lab 03/08/2022 0413 02/25/22 0056 02/26/22 0950 02/27/22 0613 03/02/22 0841  WBC 106.3* 92.1* 86.8* 98.2* 84.9*    Chronic diastolic heart failure: Compensated, got lasix iv x 1 6/`7, monitor closely.  Net IO Since Admission: -7,149.99 mL [03/02/22 1008] . Filed Weights   02/25/2022 0744 02/26/22 2314 03/01/22 0251  Weight: 51.9 kg 50.6 kg 50.3 kg    Hyperkalemia resolved after adjusting TPN.   Hypokalemia replete orally  T2DM, new onset A1c 6.5, w/ uncontrolled hyperglycemia- and w/ hypoglycemia 64 on 6/14 pm after holding TPN.  Blood sugar has been fluctuating, continue Semglee 8 units with SSI while on TPN. Recent Labs  Lab 03/01/22 1631 03/01/22 1944 03/01/22 2351 03/02/22 0435 03/02/22 0754  GLUCAP 340* 304* 109* 180* 137*     Protein-calorie malnutrition, severe: Augment nutritional status, cont TPN, continue supplement  Nutrition Problem: Severe Malnutrition Etiology: chronic illness (CLL) Signs/Symptoms: severe muscle depletion, severe fat depletion Interventions: TPN  Deconditioning/debility: Mobilize slowly, remains dyspneic and tachypneic at times.  Mostly anxious.  Pt slided to floor-unwitnessed from [facility]- during fire alarm-was seen at bedside, reports she was standing from the wheelchair trying to pee and fell on floor, did not inform transporter or nursing staff prior to standing- she had some tailbone pain after falling on bottom, x-ray coccyx/lumbar area neg.   Goals of care currently DNR, will request palliative care consultation given patient's significant comorbidities with CLL.  Overall prognosis does not appear bright.  DVT prophylaxis: enoxaparin (LOVENOX) injection 40 mg Start: 02/16/22  1515 Code Status:   Code Status: DNR Family Communication: plan of care discussed with patient/family on phone Patient status is: inpatient because of need for xrt and CLL management. Level of care: Stepdown   Dispo: The patient is from: Home, independent.            Anticipated disposition: TBD, needs PT OT evaluation once respiratory status chest tube stable Mobility Assessment (last 72 hours)     Mobility Assessment     Row Name 02/28/22 0900 02/27/22 2015         Does patient have an order for bedrest or is patient medically unstable No - Continue assessment No - Continue assessment      What is the highest level of mobility based on the progressive mobility assessment? Level 2 ([place]air[place]) - Balance while sitting on edge of bed and cannot stand Level 4 (Walks with assist in room) - Balance while marching in place and cannot step forward and back - Complete      Is the above level different from baseline mobility prior to current illness? Yes - Recommend PT order Yes - Recommend PT order                Objective: Vitals last 24 hrs: Vitals:   03/02/22 0600 03/02/22 0700 03/02/22 0736 03/02/22 0800  BP: 104/74 110/70  108/69  Pulse: 93 94  95  Resp: (!) 30 (!) 29  (!) 33  Temp:   98.3 F (36.8 C)   TempSrc:   Oral   SpO2: 99% 100%  (!) 81%  Weight:      Height:  Weight change:   Physical Examination: General exam: AA0, thin frail older than stated age, weak appearing. HEENT:Oral mucosa moist, Ear/Nose WNL grossly, dentition normal. Respiratory system: bilaterally air entry diminished, more on the right, tachypnea but no use of accessory muscle Cardiovascular system: S1 & S2 +, No JVD,. Gastrointestinal system: Abdomen soft,NT,ND, BS+ Nervous System:Alert, awake, moving extremities and grossly nonfocal Extremities: edema neg,distal peripheral pulses palpable.  Skin: No rashes,no icterus. MSK: Normal muscle bulk,tone, power PICC in LUE  Medications  reviewed: Scheduled Meds:  chlorhexidine gluconate (MEDLINE KIT)  15 mL Mouth Rinse BID   Chlorhexidine Gluconate Cloth  6 each Topical Daily   docusate sodium  100 mg Oral BID   enoxaparin (LOVENOX) injection  40 mg Subcutaneous Q24H   feeding supplement  1 Container Oral BID BM   insulin aspart  0-15 Units Subcutaneous Q4H   insulin glargine-yfgn  8 Units Subcutaneous Daily   octreotide  50 mcg Subcutaneous TID   polyethylene glycol  17 g Oral Daily   potassium chloride  30 mEq Oral Q3H   predniSONE  50 mg Oral Q breakfast   sodium chloride flush  10 mL Other Q8H   sodium chloride flush  10-40 mL Intracatheter Q12H   Continuous Infusions:  sodium chloride 10 mL/hr at 03/02/22 0857   TPN ADULT (ION) 100 mL/hr at 03/02/22 0857   TPN ADULT (ION)     Diet Order             Diet regular Room service appropriate? Yes; Fluid consistency: Thin  Diet effective now                    Nutrition Problem: Severe Malnutrition Etiology: chronic illness (CLL) Signs/Symptoms: severe muscle depletion, severe fat depletion Interventions: TPN   Intake/Output Summary (Last 24 hours) at 03/02/2022 1008 Last data filed at 03/02/2022 1005 Gross per 24 hour  Intake 3255.45 ml  Output 3370 ml  Net -114.55 ml    Net IO Since Admission: -7,149.99 mL [03/02/22 1008]  Wt Readings from Last 3 Encounters:  03/01/22 50.3 kg  03/08/20 64.4 kg  02/07/20 68.5 kg     Unresulted Labs (From admission, onward)     Start     Ordered   03/02/22 0500  CBC  Daily,   R     Question:  Specimen collection method  Answer:  Unit=Unit collect   03/01/22 0729   02/27/22 0500  Comprehensive metabolic panel  (TPN Lab Panel)  Every Mon,Thu (0500),   R     Question:  Specimen collection method  Answer:  Unit=Unit collect   02/25/22 1228   02/27/22 0500  Magnesium  (TPN Lab Panel)  Every Mon,Thu (0500),   R     Question:  Specimen collection method  Answer:  Unit=Unit collect   02/25/22 1228   02/27/22  0500  Phosphorus  (TPN Lab Panel)  Every Mon,Thu (0500),   R     Question:  Specimen collection method  Answer:  Unit=Unit collect   02/25/22 1228   02/27/22 0500  Triglycerides  (TPN Lab Panel)  Every Mon,Thu (0500),   R     Question:  Specimen collection method  Answer:  Unit=Unit collect   02/25/22 1228   02/17/2022 1442  Acid Fast Culture with reflexed sensitivities  (AFB smear + Culture w reflexed sensitivities panel)  Once,   R       See Hyperspace for full Linked Orders Report.   03/02/2022 1441  Data Reviewed: I have personally reviewed following labs and imaging studies CBC: Recent Labs  Lab 02/15/2022 0413 02/25/22 0056 02/26/22 0950 02/27/22 0613 03/02/22 0841  WBC 106.3* 92.1* 86.8* 98.2* 84.9*  NEUTROABS 5.4 7.0 6.1 7.1  --   HGB 12.3 11.0* 11.2* 12.3 11.2*  HCT 39.5 36.3 35.8* 39.6 36.0  MCV 91.6 93.3 93.7 91.7 92.5  PLT 498* 474* 508* 457* 948    Basic Metabolic Panel: Recent Labs  Lab 02/25/22 0056 02/26/22 0341 02/27/22 0165 02/28/22 0422 02/28/22 0727 02/28/22 2032 03/01/22 0355 03/02/22 0841  NA 137 138 137 135  --   --  136 139  K 5.1 4.7 4.7 5.6* 5.6* 4.4 3.8 3.1*  CL 102 101 103 100  --   --  103 102  CO2 30 33* 30 29  --   --  31 31  GLUCOSE 148* 128* 93 187*  --   --  158* 156*  BUN 20 21 28* 27*  --   --  27* 32*  CREATININE 0.43* 0.45 0.49 0.43*  --   --  0.37* 0.37*  CALCIUM 7.8* 8.1* 8.1* 7.5*  --   --  7.5* 7.9*  MG 1.9 2.0 2.0 1.8  --   --  2.0  --   PHOS  --  3.2 1.9* 2.6  --   --  3.1  --     GFR: Estimated Creatinine Clearance: 58.6 mL/min (A) (by C-G formula based on SCr of 0.37 mg/dL (L)). Liver Function Tests: Recent Labs  Lab 02/26/22 0341 02/27/22 0613  AST 36 33  ALT 39 35  ALKPHOS 69 71  BILITOT 0.3 0.3  PROT 4.6* 4.9*  ALBUMIN 2.0* 2.2*    No results for input(s): "LIPASE", "AMYLASE" in the last 168 hours. No results for input(s): "AMMONIA" in the last 168 hours. Coagulation Profile: No results for  input(s): "INR", "PROTIME" in the last 168 hours. BNP (last 3 results) No results for input(s): "PROBNP" in the last 8760 hours. HbA1C: No results for input(s): "HGBA1C" in the last 72 hours. CBG: Recent Labs  Lab 03/01/22 1631 03/01/22 1944 03/01/22 2351 03/02/22 0435 03/02/22 0754  GLUCAP 340* 304* 109* 180* 137*    Lipid Profile: No results for input(s): "CHOL", "HDL", "LDLCALC", "TRIG", "CHOLHDL", "LDLDIRECT" in the last 72 hours.  Thyroid Function Tests: No results for input(s): "TSH", "T4TOTAL", "FREET4", "T3FREE", "THYROIDAB" in the last 72 hours. Sepsis Labs: No results for input(s): "PROCALCITON", "LATICACIDVEN" in the last 168 hours.  Recent Results (from the past 240 hour(s))  Body fluid culture w Gram Stain     Status: None   Collection Time: 03/06/2022  2:35 PM   Specimen: Pleural Fluid  Result Value Ref Range Status   Specimen Description PLEURAL  Final   Special Requests NONE  Final   Gram Stain   Final    FEW WBC PRESENT, PREDOMINANTLY PMN NO ORGANISMS SEEN    Culture   Final    NO GROWTH 3 DAYS Performed at [facility], [address], [place], [place] [address]    Report Status 02/18/2022 FINAL  Final  Fungus Culture With Stain     Status: None (Preliminary result)   Collection Time: 02/18/2022  2:35 PM   Specimen: Pleural Fluid  Result Value Ref Range Status   Fungus Stain Final report  Final    Comment: (NOTE) Performed At: [facility] [place], [place] [id number] Rush Farmer MD [id number]  Fungus (Mycology) Culture PENDING  Incomplete   Fungal Source PLEURAL  Final    Comment: Performed at [facility], [address] [place], [place] [address]  Acid Fast Smear (AFB)     Status: None   Collection Time: 02/26/2022  2:35 PM   Specimen: Pleural, Left  Result Value Ref Range Status   AFB Specimen Processing Concentration  Final   Acid Fast Smear Negative  Final    Comment: (NOTE) Performed At: [facility] [address] [place], [place] [id number] Jolene Schimke MD EJ:[id number]    Source (AFB) PLEURAL  Final    Comment: Performed at [facility], [address] [place], [place] [address]  Fungus Culture Result     Status: None   Collection Time: 03/14/2022  2:35 PM  Result Value Ref Range Status   Result 1 Comment  Final    Comment: (NOTE) KOH/Calcofluor preparation:  no fungus observed. Performed At: [facility] [address] [place], [place] [id number] Jolene Schimke MD AI:[id number]     Antimicrobials: Anti-infectives (From admission, onward)    Start     Dose/Rate Route Frequency Ordered Stop   02/17/22 1600  metroNIDAZOLE (FLAGYL) IVPB 500 mg        500 mg 100 mL/hr over 60 Minutes Intravenous Every 12 hours 02/17/22 0713 02/18/22 1926   02/16/22 1600  ceFEPIme (MAXIPIME) 2 g in sodium chloride 0.9 % 100 mL IVPB        2 g 200 mL/hr over 30 Minutes Intravenous Every 12 hours 02/16/22 0818 02/18/22 1847   02/16/22 1500  metroNIDAZOLE (FLAGYL) IVPB 500 mg  Status:  Discontinued        500 mg 100 mL/hr over 60 Minutes Intravenous Every 12 hours 02/16/22 1404 02/17/22 0711   02/15/22 1800  vancomycin (VANCOREADY) IVPB 1250 mg/250 mL  Status:  Discontinued        1,250 mg 166.7 mL/hr over 90 Minutes Intravenous Every 24 hours 03/07/2022 1955 02/15/22 0737   02/15/22 1415  metroNIDAZOLE (FLAGYL) IVPB 500 mg  Status:  Discontinued        500 mg 100 mL/hr over 60 Minutes Intravenous Every 12 hours 02/15/22 1325 02/16/22 0840   02/15/22 0400  ceFEPIme (MAXIPIME) 2 g in sodium chloride 0.9 % 100 mL IVPB  Status:  Discontinued        2 g 200 mL/hr over 30 Minutes Intravenous Every 8 hours 02/13/2022 1955 02/16/22 0817   03/11/2022 2000  vancomycin (VANCOREADY) IVPB 1250 mg/250 mL  Status:  Discontinued        1,250 mg 166.7 mL/hr over 90 Minutes Intravenous Every 24 hours 03/08/2022 1952 03/03/2022 1955   03/04/2022 1945  ceFEPIme (MAXIPIME) 2 g in sodium  chloride 0.9 % 100 mL IVPB        2 g 200 mL/hr over 30 Minutes Intravenous  Once 03/14/2022 1931 02/24/2022 2051   02/13/2022 1945  metroNIDAZOLE (FLAGYL) IVPB 500 mg        500 mg 100 mL/hr over 60 Minutes Intravenous  Once 02/28/2022 1931 02/24/2022 2053   02/13/2022 1945  vancomycin (VANCOCIN) IVPB 1000 mg/200 mL premix  Status:  Discontinued        1,000 mg 200 mL/hr over 60 Minutes Intravenous  Once 02/15/2022 1931 02/28/2022 2053      Culture/Microbiology    Component Value Date/Time   SDES PLEURAL 03/11/2022 1435   SPECREQUEST NONE 02/24/2022 1435   CULT  02/20/2022 1435  NO GROWTH 3 DAYS Performed at [facility], [place] [address] [place], [place] [address]    REPTSTATUS 02/23/2022 FINAL 02/17/2022 1435    Other culture-see note  Radiology Studies: DG CHEST PORT 1 VIEW  Result Date: 03/02/2022 CLINICAL DATA:  Right chest tube fell out today. Follow-up large left pleural effusion. Evaluate for pneumothorax. EXAM: PORTABLE CHEST 1 VIEW COMPARISON:  Portable chest yesterday at 4:52 p.m. FINDINGS: 6:54 a.m., 03/02/2022. Left PICC remains in place with tip at the superior cavoatrial junction. Right chest tube is no longer seen. There is no visible pneumothorax. A large left pleural effusion again obscures the lower 2/3 of the left chest and is unchanged. Small to moderate right pleural effusion and overlying hazy interstitial consolidation are also similar. The remaining right lung is clear. Heart size is stable , central vessels are slightly prominent without edema. Aortic atherosclerosis. Osteopenia and degenerative change both shoulders. IMPRESSION: Overall aeration is unchanged. A large left pleural effusion small to moderate right pleural effusion and overlying consolidation appear similar. No visible pneumothorax with right chest tube no longer seen. Electronically Signed   By: Telford Nab M.D.   On: 03/02/2022 07:34   DG Chest Port 1 View  Result Date: 03/01/2022 CLINICAL DATA:   Shortness of breath. EXAM: PORTABLE CHEST 1 VIEW COMPARISON:  02/28/2022 FINDINGS: No significant change in a large left pleural effusion. Increased patchy density in the adjacent left lung. Interval patchy density in the right lower lung zone. Slight increase in size of a small right pleural effusion. Left PICC tip in the region of the superior cavoatrial junction. A pill right basilar chest tube is again noted. No pneumothorax. Grossly normal sized heart. Partially calcified thoracic aorta. Mild right and minimal left shoulder degenerative changes. IMPRESSION: 1. Interval patchy atelectasis or pneumonia in the right lower lung zone. 2. Increased patchy atelectasis or pneumonia in the aerated portion of the left lung inferiorly. 3. Stable large left pleural effusion and slight increase in size of a small right pleural effusion. Electronically Signed   By: Claudie Revering M.D.   On: 03/01/2022 17:10   DG Chest Port 1 View  Result Date: 02/28/2022 CLINICAL DATA:  Pleural effusion EXAM: PORTABLE CHEST 1 VIEW COMPARISON:  Previous studies including the examination done earlier today FINDINGS: There is large left pleural effusion obscuring left mid and left lower lung fields. There is minimal blunting of right lateral CP angle. Right chest tube is noted with its tip in the medial right lower lung fields. Proximal portion of the right pigtail chest tube appears to be coiled possibly in the pleural space or chest wall. This finding has not changed. There is no pneumothorax. Visualized lung fields show no signs of pulmonary edema or focal consolidation. Possible subsegmental atelectasis is seen in the right lower lung fields. Evaluation of left mid and left lower lung fields for atelectasis/pneumonia is limited by the effusion. Tip of PICC line introduced through the left upper extremity is seen in the region of junction of superior vena cava and right atrium. IMPRESSION: Large left pleural effusion has not changed  significantly. Small right pleural effusion. Pigtail right chest tube. Proximal portion of the chest tube is coiled, possibly in the pleural space or chest wall. There is no pneumothorax. Electronically Signed   By: Elmer Picker M.D.   On: 02/28/2022 19:54   DG Chest 1 View  Result Date: 02/28/2022 CLINICAL DATA:  Chest crackles.  Encounter for chest tube placement. EXAM: CHEST  1 VIEW COMPARISON:  AP chest 02/27/2022 FINDINGS: New left upper extremity PICC line tip overlies the superior vena cava/right atrial junction. Moderate-to-large left pleural effusion is unchanged. The right lung is well aerated.  No definite right pleural effusion. Right lower thorax pigtail drainage catheter is at a similar height. No pneumothorax is seen. Right axillary surgical clips. IMPRESSION: One. No significant change in moderate-to-large left pleural effusion. No significant change in position of right basilar pigtail drainage catheter. No significant right pleural effusion. No right pneumothorax is seen. Electronically Signed   By: Yvonne Kendall M.D.   On: 02/28/2022 17:40   DG Lumbar Spine 2-3 Views  Result Date: 02/28/2022 CLINICAL DATA:  Multiple falls. Low back and tailbone pain. EXAM: LUMBAR SPINE - 2-3 VIEW COMPARISON:  Abdominopelvic CT 02/16/2022. FINDINGS: Five lumbar type vertebral bodies. The alignment is normal. No evidence of acute fracture or pars defect. There is multilevel spondylosis with disc space narrowing and endplate osteophyte formation at the lower 3 levels. Mild facet degenerative changes are present. There is aortic atherosclerosis. IMPRESSION: No evidence of acute lumbar spine fracture or malalignment. Mild spondylosis. Electronically Signed   By: Richardean Sale M.D.   On: 02/28/2022 15:22   DG Sacrum/Coccyx  Result Date: 02/28/2022 CLINICAL DATA:  Multiple falls.  Low back and tailbone pain. EXAM: SACRUM AND COCCYX - 2+ VIEW COMPARISON:  Pelvic CT 02/25/2022. FINDINGS: The bones  appear mildly demineralized. No evidence of acute fracture or sacroiliac joint diastasis. The symphysis pubis appears normal. Scattered pelvic calcifications, similar to previous CT and likely phleboliths. IMPRESSION: No evidence of acute sacrococcygeal fracture. Electronically Signed   By: Richardean Sale M.D.   On: 02/28/2022 15:20     LOS: 16 days   Antonieta Pert, MD [facility]  03/02/2022, 10:08 AM

## 2022-03-02 NOTE — Progress Notes (Signed)
[place] Progress Note Patient Name: [name] DOB: 08/22/1961 MRN: [id number]   Date of Service  03/02/2022  HPI/Events of Note  Notified by RN of increased work of breathing.  On camera assessment, pt is sitting upright.  BP 134/91, HR 105, RR 42, 97% on 2L. She does not seem in respiratory distress but patient did report she feels more short of breath.   CXR from this morning shows large let pleural effusion, small to moderate right effusion and overlying consolidation but appear unchanged overall.   eICU Interventions  Repeat CXR now. If unchanged, can give morphine PRN.      Intervention Category Intermediate Interventions: Respiratory distress - evaluation and management  Elsie Lincoln 03/02/2022, 11:18 PM  12:09 AM CXR shows progression of the right lung base opacities.  Pt is saturating adequately on nasal cannula at this time. RR 28-37, O2 sats 98%.   Plan> Hold off on morphine.  Pt will need pleurx placed to drain effusions. Will place on BIPAP if respiratory status deteriorates.

## 2022-03-02 NOTE — Progress Notes (Addendum)
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  Assessment: 61 year old female with pertinent PMH of CLL, depression, anxiety, HLD, HTN presents to [facility] ED on 6/2 unresponsive.  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 Patient also has bilateral chylothorax with high output.  Pharmacy has been consulted for TPN.  Glucose / Insulin: new onset DM2, A1c 6.5  On SSI q4h, Semglee 8 units daily - used 26 units of SSI past 24 hrs - SSI changed to moderate scale 6/17 pm - CBGs (goal <150) - on prednisone 50 mg daily  Electrolytes: stable Renal: Scr < 1, BUN slightly elevated at 27 Hepatic: LFTs wnl - TG 108 - albumin low 2.2 Intake / Output: -3.2 L - Chest tube 870 ml in 24 hrs - dislodged 6/18 am -PO intake 720 ml MIVF:  none  GI Imaging: 6/10 CT Chest Bilateral large pleural effusions and bulky axillary and lower cervical lymphadenopathy  GI Surgeries / Procedures: None this admission  Central access: PICC placed 6/15 TPN start date: 6/13  Nutritional Goals: Goal TPN rate is 100 mL/hr (provides 150 g of protein and 2357 kcals per day)  RD Assessment: Estimated Needs Total Energy Estimated Needs: 2300-2600 Total Protein Estimated Needs: 140-160 grams Total Fluid Estimated Needs: >2.5 L  Current Nutrition:  -  Low fat diet, no more than 7 g fat per meal - Boost/resource breeze BID -  TPN  Significant Events:  - 6/14: TPN turned off at ~10:30p for transport to [facility]. D10 started while off TPN - 6/16 am TPN off/on D10 for K 5.6, TPN resumed 1800  Plan:  - Continue TPN at 100 ml/hr - Electrolytes in TPN: Na 50 mEq/L K  17 mEq/L Ca 5 mEq/L Mg 5 mEq/L Phos 10 mmol/L Cl:Ac 1:2 - Addendum: K 3.1 this morning (likely in setting of Lasix  administration yesterday), potassium 30 mEq x 2 ordered by MD. Will continue with 40 mEq in TPN for now given that patient previously with hyperkalemia with 60 mEq K in TPN. Labs ordered for tomorrow morning, can further adjust potassium as indicated. - Add standard MVI and trace elements to TPN - Continue Semglee 8 units daily - Continue moderate SSI q4h and adjust as needed - Monitor TPN labs on Mon/Thurs  Tawnya Crook, PharmD, BCPS Clinical Pharmacist 03/02/2022 7:37 AM

## 2022-03-02 NOTE — Progress Notes (Signed)
NAME:  [name], MRN:  [id number], DOB:  October 20, 1960, LOS: 48 ADMISSION DATE:  02/23/2022, CONSULTATION DATE:  6/2 REFERRING MD:  Dr. Alvino Chapel, CHIEF COMPLAINT:  Unresponsive; Acute resp failure   BRIEF  Patient is a 61 year old female with pertinent PMH of CLL, depression, anxiety, HLD, HTN presents to [facility] ED on 6/2 unresponsive.  On 6/2 patient called EMS for abdominal pain.  Patient initially was alert and talking but became unresponsive with AMS.  Patient was hypoxic with agonal breathing and was placed on NRB.  Patient was noted to be hypotensive.  Patient transported to [facility] ED.  Upon arrival to [facility] ED patient remained unresponsive with agonal respirations.  Patient intubated for airway protection and hypoxia.  BP initially 81/65.  Given IV fluids.  Started on levo.  Fever 100.9 F and WBC 104.4.  Started on cefepime, Flagyl, Vanco.  Cultures pending.  Glucose 198, BNP 534, troponin 109 then 112, LA 4.4 then 1.8, UA unremarkable, UDS negative.  CXR with large left pleural effusion and small right pleural effusion.  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  CT abdomen with ascending colitis constipation, small intestine wall thickening possibly related to gastroenteritis; small volume ascites; extensive abdominopelvic and inguinal adenopathy.   PCCM was consulted for bilateral pleural effusion  Pertinent  Medical History    has a past medical history of Anxiety, Chronic abdominal pain, Chronic lymphocytic leukemia (CLL), B-cell ([place]) (04/04/2016), CLL (chronic lymphocytic leukemia) ([place]), Depression with anxiety (07/07/2015), Family history of hyperlipidemia (12/14/2019), H. 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   has a past surgical history that includes Surgery of left middle finger on left hand (childhood ); Tubal ligation; Cholecystectomy; Colonoscopy (02/07/2011); Hammer toe surgery (Left); Axillary lymph node biopsy (Right, 03/28/2016); Hysteroscopy with D & C (N/A, 07/30/2016); Thoracentesis (Left, 03/12/2022); and Chest tube insertion (N/A, 02/13/2022).     Significant Hospital Events: Including procedures, antibiotic start and stop dates in addition to other pertinent events   6/2: Patient admitted to [facility] ED unresponsive; intubated; shock started on levo; transferred to [facility]. CT chest and abd: 1. Extensive abdominopelvic and inguinal adenopathy, most likely related to lymphoma/leukemia. Numerous small hypodense splenic lesions versus bolus phase phenomenon. Intrahepatic periportal edema which is probably either due to fluid overload or hepatic dysfunction. The portal vein is patent within normal caliber limits. Chronic calcific pancreatitis but no findings of acute pancreatitis or ductal dilatation.  Ascending colitis versus nondistention, remainder of the colon with constipation. Diffuse small intestinal wall thickening with gastric fold thickening, could relate to gastroenteritis or hepatic dysfunction versus reactive from ascites versus congestive or infiltrating disease. 6/3 right chest tube placed. Left thora 2000 ml removed - milky 6/4 extubated -exudative, cx negative, lymphocytic. 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 Off pressors 6/6 Transferred to [facility] 6/6 ultrasound-guided right axillary lymph node biopsy -Started on pulse dose steroids at 50 mg prednisone 6/7 chest tube output over a liter 6/8 left thoracentesis for 1400 cc of fluid 6/10 - chest tube removed 6/12 - s/p right chest tube placement with 3.1 L drained  6/3 - Transferred from [facility] to [facility] 6/13 for initiation of palliative radiotherapy 02/26/22 - No chest pain. Saturation 96% on 2 L.  Chest tube drained 190 cc  of milky fluid 02/27/22 - now at [facility] long hospital bed 1610. Per RN - fall earlier today . CXR stable. Doing well. Got SIM for XRT.  On room air. HAs Chest tube right side draining chylothorax. Has mod-large effusion on the left side -refused chest tube.  Remains on octreiotice 6/16 - on TPN.  On [place] O2 . > 1000 cc rt sided chest tube output.  pulse prednisone 50 mg daily started XRT 6/15, rituximab is being considered 6/17 -she is stable with the same amount of oxygen but right-sided chest tube is somewhat withdrawn and also cannot.  Nevertheless it still draining chylothorax.  She feels the same.  She is unsure if she wants chest tube reinserted on the right side if this chest tube was to follow-up.  Definitely does not want chest tube on the left side.  Interim History / Subjective:    6/18 - Rt chest tube tube gradually dislodged completely.  Leaked and then dressing. No furhter wound leak. She "stable" on 2L [place] per RN On TPN.  She denies dyspnea.   Objective   Blood pressure 95/61, pulse 92, temperature 98.3 F (36.8 C), temperature source Oral, resp. rate (!) 32, height '5\' 5"'$  (1.651 m), weight 50.3 kg, last menstrual period 07/25/2016, SpO2 97 %.        Intake/Output Summary (Last 24 hours) at 03/02/2022 1107 Last data filed at 03/02/2022 1005 Gross per 24 hour  Intake 3255.45 ml  Output 3370 ml  Net -114.55 ml   Filed Weights   03/09/2022 0744 02/26/22 2314 03/01/22 0251  Weight: 51.9 kg 50.6 kg 50.3 kg   Physical Exam: General Appearance:  Frail, lean deconditioned female. wAtching TV Head:  Normocephalic, without obvious abnormality, atraumatic Eyes:  PERRL - yes, conjunctiva/corneas - muddy     Ears:  Normal external ear canals, both ears Nose:  G tube - no but has [place] Throat:  ETT TUBE - no , OG tube - no Neck:  Supple,  No enlargement/tenderness/nodules Lungs: Clear to auscultation bilaterally, Mildly labored. On 3L [place] Heart:  S1 and S2 normal, no  murmur, CVP - no.  Pressors - n Abdomen:  Soft, no masses, no organomegaly Genitalia / Rectal:  Not done Extremities:  Extremities- intact Skin:  ntact in exposed areas . Sacral area - not examined Neurologic:  Sedation - none -> RASS - +1 . Moves all 4s - yes. CAM-ICU - neg . 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 Immediate post cxr wthout fluid reaccumulation   Plan - Consider PleurX  of Left  or Right and then bilateral  => start with juts 1 initially  -> for long term fluid management and palliative relief of dyspnea till chemo/XRT makes (v not) a difference for her  - CCM MD discussed 6/18 benefits, risks, limitations of PleurX and she is willing - repeat CXTR 03/03/22  - continuie  octreotide to 50 mcg 3 times daily  in attempt to slow chyle production  - hopefully chemo helps  - o2 for pulze ox > 92% (racial pulsd ox gap) - consider morphine for dyspnea relief  if needed   CLL/SLL with bulky mediastinal adenopathy - primary issue  - thoracentesis on 02/15/22 showed atypical lymphocytes suspicious for lymphoproliferative process,  -  right axillary node was biopsies on 02/18/22 showing B cells consistent with her history of lymphoma and CLL - Onc consult  02/16/22 Dr Lorenso Courier  - XRT consult 02/27/22  Dr Lisbeth Renshaw  Plan -Per oncology, on prednisone Rituximab being considered aos 02/27/22   Protein calorie malnutrition, moderate to severe - per triad Chronic HFpEF due to uncontrolled hypertension  Per triad    Best practice:  Per TRiad  Move to floor med surg  D/w Dr Dyann Ruddle    Dr. Brand Males, M.D.,  F.C.C.P,  Pulmonary and Critical Care Medicine Staff Physician, [place] Director - Interstitial Lung Disease  Program  Medical Director - [facility] [place], [place], [address]  NPI Number:  NPI #[id number] [place] Number: [id number]  Pager: [phone], If no answer  -> Check AMION or Try [phone] Telephone (clinical office): [phone] Telephone (research): [phone]  11:07 AM 03/02/2022    LABS    PULMONARY Recent Labs  Lab 03/01/22 1753  PHART 7.37  PCO2ART 44  PO2ART 68*  HCO3 25.4  O2SAT 94.6    CBC Recent Labs  Lab 02/26/22 0950 02/27/22 0613 03/02/22 0841  HGB 11.2* 12.3 11.2*  HCT 35.8* 39.6 36.0  WBC 86.8* 98.2* 84.9*  PLT 508* 457* 291    COAGULATION No results for input(s): "INR" in the last 168 hours.  CARDIAC  No results for input(s): "TROPONINI" in the last 168 hours. No results for input(s): "PROBNP" in the last 168 hours.   CHEMISTRY Recent Labs  Lab 02/25/22 0056 02/26/22 0341 02/27/22 4888 02/28/22 0422 02/28/22 0727 03/01/22 0355 03/02/22 0841  NA 137 138 137 135  --  136 139  K 5.1 4.7 4.7 5.6*   < > 3.8 3.1*  CL 102 101 103 100  --  103 102  CO2 30 33* 30 29  --  31 31  GLUCOSE 148* 128* 93 187*  --  158* 156*  BUN 20 21 28* 27*  --  27* 32*  CREATININE 0.43* 0.45 0.49 0.43*  --  0.37* 0.37*  CALCIUM 7.8* 8.1* 8.1* 7.5*  --  7.5* 7.9*  MG 1.9 2.0 2.0 1.8  --  2.0  --   PHOS  --  3.2 1.9* 2.6  --  3.1  --    < > = values in this interval not displayed.   Estimated Creatinine Clearance: 58.6 mL/min (A) (by C-G formula based on SCr of 0.37 mg/dL (L)).   LIVER Recent Labs  Lab 02/26/22 0341 02/27/22 0613  AST 36 33  ALT 39 35  ALKPHOS 69 71  BILITOT 0.3 0.3  PROT 4.6* 4.9*  ALBUMIN 2.0* 2.2*     INFECTIOUS No results for input(s): "LATICACIDVEN", "PROCALCITON" in the last 168 hours.   ENDOCRINE CBG (last 3)   Recent Labs    03/01/22 2351 03/02/22 0435 03/02/22 0754  GLUCAP 109* 180* 137*         IMAGING x48h  - image(s) personally visualized  -   highlighted in bold DG CHEST PORT 1 VIEW  Result Date: 03/02/2022 CLINICAL DATA:  Right chest tube fell out today. Follow-up large left pleural effusion. Evaluate for pneumothorax. EXAM: PORTABLE CHEST 1 VIEW COMPARISON:  Portable chest yesterday at  4:52 p.m. FINDINGS: 6:54 a.m., 03/02/2022. Left PICC remains in place with tip at the superior cavoatrial junction. Right chest tube is no longer seen. There is no visible pneumothorax. A large left pleural effusion again obscures the lower 2/3 of the left chest and is unchanged. Small to moderate right pleural effusion and overlying hazy interstitial consolidation are also similar. The remaining right lung is clear. Heart size is stable , central vessels are slightly prominent without edema. Aortic atherosclerosis. Osteopenia and degenerative change both shoulders. IMPRESSION: Overall aeration is unchanged. A large left pleural effusion small to moderate right pleural effusion and overlying consolidation appear similar. No visible pneumothorax with right chest tube no longer seen. Electronically Signed   By: Telford Nab M.D.   On: 03/02/2022 07:34   DG Chest Port 1 View  Result Date: 03/01/2022 CLINICAL DATA:  Shortness of breath. EXAM: PORTABLE CHEST 1 VIEW COMPARISON:  02/28/2022 FINDINGS: No significant change in a large left pleural effusion. Increased patchy density in the adjacent left lung. Interval patchy density in the right lower lung zone. Slight increase in size of a small right pleural effusion. Left PICC tip in the region of the superior cavoatrial junction. A pill right basilar chest tube is again noted. No pneumothorax. Grossly normal sized heart. Partially calcified thoracic aorta. Mild right and minimal left shoulder degenerative changes. IMPRESSION: 1. Interval patchy atelectasis or  pneumonia in the right lower lung zone. 2. Increased patchy atelectasis or pneumonia in the aerated portion of the left lung inferiorly. 3. Stable large left pleural effusion and slight increase in size of a small right pleural effusion. Electronically Signed   By: Claudie Revering M.D.   On: 03/01/2022 17:10   DG Chest Port 1 View  Result Date: 02/28/2022 CLINICAL DATA:  Pleural effusion EXAM: PORTABLE CHEST 1 VIEW COMPARISON:  Previous studies including the examination done earlier today FINDINGS: There is large left pleural effusion obscuring left mid and left lower lung fields. There is minimal blunting of right lateral CP angle. Right chest tube is noted with its tip in the medial right lower lung fields. Proximal portion of the right pigtail chest tube appears to be coiled possibly in the pleural space or chest wall. This finding has not changed. There is no pneumothorax. Visualized lung fields show no signs of pulmonary edema or focal consolidation. Possible subsegmental atelectasis is seen in the right lower lung fields. Evaluation of left mid and left lower lung fields for atelectasis/pneumonia is limited by the effusion. Tip of PICC line introduced through the left upper extremity is seen in the region of junction of superior vena cava and right atrium. IMPRESSION: Large left pleural effusion has not changed significantly. Small right pleural effusion. Pigtail right chest tube. Proximal portion of the chest tube is coiled, possibly in the pleural space or chest wall. There is no pneumothorax. Electronically Signed   By: Elmer Picker M.D.   On: 02/28/2022 19:54   DG Chest 1 View  Result Date: 02/28/2022 CLINICAL DATA:  Chest crackles.  Encounter for chest tube placement. EXAM: CHEST  1 VIEW COMPARISON:  AP chest 02/27/2022 FINDINGS: New left upper extremity PICC line tip overlies the superior vena cava/right atrial junction. Moderate-to-large left pleural effusion is unchanged. The right lung is  well aerated.  No definite right pleural effusion. Right lower thorax pigtail drainage catheter is at a similar height. No pneumothorax is seen. Right axillary surgical clips. IMPRESSION: One. No significant change in moderate-to-large left pleural effusion. No  significant change in position of right basilar pigtail drainage catheter. No significant right pleural effusion. No right pneumothorax is seen. Electronically Signed   By: Yvonne Kendall M.D.   On: 02/28/2022 17:40   DG Lumbar Spine 2-3 Views  Result Date: 02/28/2022 CLINICAL DATA:  Multiple falls. Low back and tailbone pain. EXAM: LUMBAR SPINE - 2-3 VIEW COMPARISON:  Abdominopelvic CT 03/06/2022. FINDINGS: Five lumbar type vertebral bodies. The alignment is normal. No evidence of acute fracture or pars defect. There is multilevel spondylosis with disc space narrowing and endplate osteophyte formation at the lower 3 levels. Mild facet degenerative changes are present. There is aortic atherosclerosis. IMPRESSION: No evidence of acute lumbar spine fracture or malalignment. Mild spondylosis. Electronically Signed   By: Richardean Sale M.D.   On: 02/28/2022 15:22   DG Sacrum/Coccyx  Result Date: 02/28/2022 CLINICAL DATA:  Multiple falls.  Low back and tailbone pain. EXAM: SACRUM AND COCCYX - 2+ VIEW COMPARISON:  Pelvic CT 02/19/2022. FINDINGS: The bones appear mildly demineralized. No evidence of acute fracture or sacroiliac joint diastasis. The symphysis pubis appears normal. Scattered pelvic calcifications, similar to previous CT and likely phleboliths. IMPRESSION: No evidence of acute sacrococcygeal fracture. Electronically Signed   By: Richardean Sale M.D.   On: 02/28/2022 15:20

## 2022-03-02 NOTE — Progress Notes (Signed)
Went to patient's bedside around 6am and noticed chest tube leaking onto bed. RN placed bed pad behind patient to catch leaking fluid. When RN returned to pt room around 640 patient had pulled pad out from behind her because it had become soaked with drainage. When RN went to assess chest tube site, found entire length of cathter was removed from patient. Catheter intact and vital signs were stable. Provider notified and stat chest x ray ordered. RN will continue to monitor patient.

## 2022-03-03 ENCOUNTER — Inpatient Hospital Stay (HOSPITAL_COMMUNITY): Payer: Medicaid Other

## 2022-03-03 ENCOUNTER — Ambulatory Visit: Admit: 2022-03-03 | Payer: Medicaid Other

## 2022-03-03 DIAGNOSIS — R0602 Shortness of breath: Secondary | ICD-10-CM

## 2022-03-03 DIAGNOSIS — J940 Chylous effusion: Secondary | ICD-10-CM | POA: Diagnosis not present

## 2022-03-03 DIAGNOSIS — J9601 Acute respiratory failure with hypoxia: Secondary | ICD-10-CM | POA: Diagnosis not present

## 2022-03-03 LAB — COMPREHENSIVE METABOLIC PANEL
ALT: 31 U/L (ref 0–44)
AST: 32 U/L (ref 15–41)
Albumin: 2.2 g/dL — ABNORMAL LOW (ref 3.5–5.0)
Alkaline Phosphatase: 74 U/L (ref 38–126)
Anion gap: 6 (ref 5–15)
BUN: 31 mg/dL — ABNORMAL HIGH (ref 8–23)
CO2: 30 mmol/L (ref 22–32)
Calcium: 8.4 mg/dL — ABNORMAL LOW (ref 8.9–10.3)
Chloride: 104 mmol/L (ref 98–111)
Creatinine, Ser: 0.31 mg/dL — ABNORMAL LOW (ref 0.44–1.00)
GFR, Estimated: >60 mL/min (ref >60)
Glucose, Bld: 96 mg/dL (ref 70–99)
Potassium: 3.9 mmol/L (ref 3.5–5.1)
Sodium: 140 mmol/L (ref 135–145)
Total Bilirubin: 0.3 mg/dL (ref 0.3–1.2)
Total Protein: 5.1 g/dL — ABNORMAL LOW (ref 6.5–8.1)

## 2022-03-03 LAB — CBC
HCT: 36 % (ref 36.0–46.0)
Hemoglobin: 11.3 g/dL — ABNORMAL LOW (ref 12.0–15.0)
MCH: 29.4 pg (ref 26.0–34.0)
MCHC: 31.4 g/dL (ref 30.0–36.0)
MCV: 93.8 fL (ref 80.0–100.0)
Platelets: 297 10*3/uL (ref 150–400)
RBC: 3.84 MIL/uL — ABNORMAL LOW (ref 3.87–5.11)
RDW: 18.7 % — ABNORMAL HIGH (ref 11.5–15.5)
WBC: 85.9 10*3/uL (ref 4.0–10.5)
nRBC: 0 % (ref 0.0–0.2)

## 2022-03-03 LAB — GLUCOSE, CAPILLARY
Glucose-Capillary: 119 mg/dL — ABNORMAL HIGH (ref 70–99)
Glucose-Capillary: 153 mg/dL — ABNORMAL HIGH (ref 70–99)
Glucose-Capillary: 178 mg/dL — ABNORMAL HIGH (ref 70–99)
Glucose-Capillary: 186 mg/dL — ABNORMAL HIGH (ref 70–99)
Glucose-Capillary: 189 mg/dL — ABNORMAL HIGH (ref 70–99)
Glucose-Capillary: 213 mg/dL — ABNORMAL HIGH (ref 70–99)
Glucose-Capillary: 223 mg/dL — ABNORMAL HIGH (ref 70–99)

## 2022-03-03 LAB — TRIGLYCERIDES: Triglycerides: 92 mg/dL (ref <150)

## 2022-03-03 LAB — MAGNESIUM: Magnesium: 2.1 mg/dL (ref 1.7–2.4)

## 2022-03-03 LAB — PHOSPHORUS: Phosphorus: 2.8 mg/dL (ref 2.5–4.6)

## 2022-03-03 MED ORDER — INSULIN GLARGINE-YFGN 100 UNIT/ML ~~LOC~~ SOLN
10.0000 [IU] | Freq: Every day | SUBCUTANEOUS | Status: DC
  Administered 2022-03-04 – 2022-03-11 (×8): 10 [IU] via SUBCUTANEOUS
  Filled 2022-03-03 (×8): qty 0.1

## 2022-03-03 MED ORDER — MIDODRINE HCL 5 MG PO TABS
5.0000 mg | ORAL_TABLET | Freq: Three times a day (TID) | ORAL | Status: DC
  Administered 2022-03-04 – 2022-03-25 (×54): 5 mg via ORAL
  Filled 2022-03-03 (×59): qty 1

## 2022-03-03 MED ORDER — LIDOCAINE HCL (PF) 1 % IJ SOLN
30.0000 mL | Freq: Once | INTRAMUSCULAR | Status: AC
  Administered 2022-03-03: 30 mL via INTRADERMAL
  Filled 2022-03-03: qty 30

## 2022-03-03 MED ORDER — ALBUMIN HUMAN 25 % IV SOLN
INTRAVENOUS | Status: AC
  Administered 2022-03-03: 50 g via INTRAVENOUS
  Filled 2022-03-03: qty 200

## 2022-03-03 MED ORDER — FENTANYL CITRATE PF 50 MCG/ML IJ SOSY
25.0000 ug | PREFILLED_SYRINGE | INTRAMUSCULAR | Status: DC | PRN
  Administered 2022-03-07 – 2022-03-26 (×16): 25 ug via INTRAVENOUS
  Filled 2022-03-03 (×17): qty 1

## 2022-03-03 MED ORDER — OXYCODONE HCL 5 MG PO TABS
5.0000 mg | ORAL_TABLET | ORAL | Status: DC | PRN
  Administered 2022-03-04 – 2022-03-26 (×54): 5 mg via ORAL
  Filled 2022-03-03 (×56): qty 1

## 2022-03-03 MED ORDER — ALBUMIN HUMAN 25 % IV SOLN
50.0000 g | Freq: Once | INTRAVENOUS | Status: AC
Start: 2022-03-03 — End: 2022-03-03
  Administered 2022-03-03: 50 g via INTRAVENOUS
  Filled 2022-03-03: qty 200

## 2022-03-03 MED ORDER — MORPHINE SULFATE (PF) 2 MG/ML IV SOLN
2.0000 mg | INTRAVENOUS | Status: DC | PRN
  Administered 2022-03-03 – 2022-03-20 (×36): 2 mg via INTRAVENOUS
  Filled 2022-03-03 (×36): qty 1

## 2022-03-03 MED ORDER — ALBUMIN HUMAN 25 % IV SOLN
50.0000 g | Freq: Once | INTRAVENOUS | Status: AC
Start: 2022-03-03 — End: 2022-03-03

## 2022-03-03 MED ORDER — TRAVASOL 10 % IV SOLN
INTRAVENOUS | Status: AC
  Filled 2022-03-03: qty 1500

## 2022-03-03 MED ORDER — LORAZEPAM 2 MG/ML IJ SOLN
0.5000 mg | INTRAMUSCULAR | Status: DC | PRN
  Administered 2022-03-03 – 2022-03-26 (×5): 0.5 mg via INTRAVENOUS
  Filled 2022-03-03 (×5): qty 1

## 2022-03-03 MED ORDER — SODIUM CHLORIDE 0.9 % IV BOLUS
1000.0000 mL | Freq: Once | INTRAVENOUS | Status: AC
Start: 2022-03-03 — End: 2022-03-03
  Administered 2022-03-03: 1000 mL via INTRAVENOUS

## 2022-03-03 MED ORDER — SODIUM CHLORIDE 0.9% FLUSH
10.0000 mL | Freq: Three times a day (TID) | INTRAVENOUS | Status: DC
  Administered 2022-03-03 – 2022-03-20 (×44): 10 mL

## 2022-03-03 MED ORDER — SODIUM CHLORIDE 0.9% FLUSH
10.0000 mL | Freq: Three times a day (TID) | INTRAVENOUS | Status: DC
  Administered 2022-03-03 – 2022-03-20 (×50): 10 mL

## 2022-03-03 NOTE — Progress Notes (Signed)
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  Assessment: 61 year old female with pertinent PMH of CLL, depression, anxiety, HLD, HTN presents to [facility] ED on 6/2 unresponsive.  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 Patient also has bilateral chylothorax with high output.  Pharmacy has been consulted for TPN.  Glucose / Insulin: new onset DM2, A1c 6.5  On SSI q4h, Semglee 8 units daily - used 28 units of SSI past 24 hrs - SSI changed to moderate scale 6/17 pm - CBGs (goal <150) - on prednisone 50 mg daily  Electrolytes: stable Renal: Scr < 1, BUN slightly elevated at 31 Hepatic: LFTs wnl - TG 92, stable - albumin low 2.2 Intake / Output: + 2 L - Chest tube dislodged 6/18 am -PO intake 240 ml MIVF:  none  GI Imaging: 6/10 CT Chest Bilateral large pleural effusions and bulky axillary and lower cervical lymphadenopathy  GI Surgeries / Procedures: None this admission  Central access: PICC placed 6/15 TPN start date: 6/13  Nutritional Goals: Goal TPN rate is 100 mL/hr (provides 150 g of protein and 2357 kcals per day)  RD Assessment: Estimated Needs Total Energy Estimated Needs: 2300-2600 Total Protein Estimated Needs: 140-160 grams Total Fluid Estimated Needs: >2.5 L  Current Nutrition:  -  Low fat diet, no more than 7 g fat per meal - Boost/resource breeze BID -  TPN  Significant Events:  - 6/14: TPN turned off at ~10:30p for transport to [facility]. D10 started while off TPN - 6/16 am TPN off/on D10 for K 5.6, TPN resumed 1800  Plan:  - Continue TPN at 100 ml/hr - Electrolytes in TPN: Na 50 mEq/L K  21 mEq/L - increased 6/19 for 50 mEq total K in TPN Ca 5 mEq/L Mg 5 mEq/L Phos 10 mmol/L Cl:Ac 1:1 - Add standard MVI and trace  elements to TPN - Increase Semglee to 10 units daily for CBGs mostly > 200 - Continue moderate SSI q4h and adjust as needed - Monitor TPN labs on Mon/Thurs  Tawnya Crook, PharmD, BCPS Clinical Pharmacist 03/03/2022 10:15 AM

## 2022-03-03 NOTE — Procedures (Signed)
Insertion of Chest Tube Procedure Note  [name]  [id number]  [birth year]  Date:03/03/22  Time:5:32 PM    Provider Performing: Corey Harold   Procedure: Chest Tube Insertion [phone])  Indication(s) Effusion  Consent Risks of the procedure as well as the alternatives and risks of each were explained to the patient and/or caregiver.  Consent for the procedure was obtained and is signed in the bedside chart  Anesthesia Topical only with 1% lidocaine    Time Out Verified patient identification, verified procedure, site/side was marked, verified correct patient position, special equipment/implants available, medications/allergies/relevant history reviewed, required imaging and test results available.   Sterile Technique Maximal sterile technique including full sterile barrier drape, hand hygiene, sterile gown, sterile gloves, mask, hair covering, sterile ultrasound probe cover (if used).   Procedure Description Ultrasound used to identify appropriate pleural anatomy for placement and overlying skin marked. Area of placement cleaned and draped in sterile fashion.  A 14 French pigtail pleural catheter was placed into the left pleural space using Seldinger technique. Appropriate return of fluid was obtained.  The tube was connected to atrium and placed waterseal.   Complications/Tolerance None; patient tolerated the procedure well. Chest X-ray is ordered to verify placement.   EBL Minimal  Specimen(s) none   Georgann Housekeeper, AGACNP-BC [place] [facility]  See Amion for personal pager PCCM on call pager [phone] until 7pm. Please call Elink 7p-7a. [phone]  03/03/2022 5:33 PM

## 2022-03-03 NOTE — Consult Note (Incomplete)
Consultation Note Date: 03/03/2022   Patient Name: [name]  DOB: 06-12-1961  MRN: [id number]  Age / Sex: 61 y.o., female  PCP: Noreene Larsson, NP Referring Physician: Antonieta Pert, MD  Reason for Consultation: Establishing goals of care and Non pain symptom management  HPI/Patient Profile: 61 y.o. female  with past medical history of *** admitted on 03/08/2022 with ***.   Clinical Assessment and Goals of Care: ***  {Primary Decision SMOLM:78675}    SUMMARY OF RECOMMENDATIONS   *** Code Status/Advance Care Planning: {Palliative Code status:23503}   Symptom Management:  ***  Palliative Prophylaxis:  {Palliative Prophylaxis:21015}  Additional Recommendations (Limitations, Scope, Preferences): {Recommended Scope and Preferences:21019}  Psycho-social/Spiritual:  Desire for further Chaplaincy support:{YES NO:22349} Additional Recommendations: {PAL SOCIAL:21064}  Prognosis:  {Palliative Care Prognosis:23504}  Discharge Planning: {Palliative dispostion:23505}      Primary Diagnoses: Present on Admission:  Sepsis ([place])  Acute respiratory failure with hypoxia ([place])   I have reviewed the medical record, interviewed the patient and family, and examined the patient. The following aspects are pertinent.  Past Medical History:  Diagnosis Date   Anxiety    Chronic abdominal pain    resolved   Chronic lymphocytic leukemia (CLL), B-cell ([place]) 04/04/2016   CLL (chronic lymphocytic leukemia) ([place])    Depression with anxiety 07/07/2015   Family history of hyperlipidemia 12/14/2019   H. pylori infection 2/09   s/p prevpac   HAND PAIN, BILATERAL 01/29/2009   Qualifier: Diagnosis of  By: Moshe Cipro MD, Margaret     Hypertension    Iron deficiency 11/17/2016   Lymphadenopathy 01/17/2016   Medical non-compliance 08/22/2017   Nicotine addiction    Psychosis ([place])    social worker states that patient  does not like to talk about this and will stop her meds if you try and discuss this with her.   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   Social History   Socioeconomic History   Marital status: Single    Spouse name: Not on file   Number of children: 2   Years of education: Not on file   Highest education level: Not on file  Occupational History   Occupation: disabled  Tobacco Use   Smoking status: Light Smoker    Packs/day: 0.00    Years: 16.00    Total pack years: 0.00    Types: Cigarettes   Smokeless tobacco: Never   Tobacco comments:    3-4 per day   Vaping Use   Vaping Use: Never used  Substance and Sexual Activity   Alcohol use: Not Currently    Alcohol/week: 0.0 standard drinks of alcohol    Comment: occ   Drug use: No   Sexual activity: Not Currently    Birth control/protection: Post-menopausal  Other Topics Concern   Not on file  Social History Narrative   Lives alone   Social Determinants of Health   Financial Resource Strain: Low Risk  (03/08/2020)   Overall Financial Resource Strain (CARDIA)    Difficulty of Paying Living Expenses:  Not hard at all  Food Insecurity: Food Insecurity Present (03/08/2020)   Hunger Vital Sign    Worried About Running Out of Food in the Last Year: Sometimes true    Ran Out of Food in the Last Year: Never true  Transportation Needs: No Transportation Needs (03/08/2020)   PRAPARE - Hydrologist (Medical): No    Lack of Transportation (Non-Medical): No  Physical Activity: Insufficiently Active (03/08/2020)   Exercise Vital Sign    Days of Exercise per Week: 4 days    Minutes of Exercise per Session: 20 min  Stress: No Stress Concern Present (03/08/2020)   [place]    Feeling of Stress : Only a little  Social Connections: Moderately Isolated (03/08/2020)   Social Connection and Isolation Panel [NHANES]    Frequency of Communication with  Friends and Family: More than three times a week    Frequency of Social Gatherings with Friends and Family: Once a week    Attends Religious Services: More than 4 times per year    Active Member of [employer] or Organizations: No    Attends Music therapist: Never    Marital Status: Never married   Family History  Problem Relation Age of Onset   Cancer Mother    Hypertension Daughter    Scheduled Meds:  chlorhexidine gluconate (MEDLINE KIT)  15 mL Mouth Rinse BID   Chlorhexidine Gluconate Cloth  6 each Topical Daily   docusate sodium  100 mg Oral BID   enoxaparin (LOVENOX) injection  40 mg Subcutaneous Q24H   feeding supplement  1 Container Oral BID BM   insulin aspart  0-15 Units Subcutaneous Q4H   insulin glargine-yfgn  8 Units Subcutaneous Daily   octreotide  50 mcg Subcutaneous TID   polyethylene glycol  17 g Oral Daily   predniSONE  50 mg Oral Q breakfast   sodium chloride flush  10-40 mL Intracatheter Q12H   Continuous Infusions:  sodium chloride Stopped (03/03/22 1302)   TPN ADULT (ION) 100 mL/hr at 03/03/22 1302   TPN ADULT (ION)     PRN Meds:.acetaminophen, morphine injection, ondansetron (ZOFRAN) IV, sodium chloride flush Medications Prior to Admission:  Prior to Admission medications   Medication Sig Start Date End Date Taking? Authorizing Provider  lisinopril (ZESTRIL) 10 MG tablet TAKE 1 TABLET BY MOUTH ONCE A DAY. Patient not taking: Reported on 02/16/2022 03/20/20   Fayrene Helper, MD  Vitamin D, Ergocalciferol, (DRISDOL) 1.25 MG (50000 UNIT) CAPS capsule Take 1 capsule (50,000 Units total) by mouth every 7 (seven) days. Patient not taking: Reported on 02/16/2022 12/15/19   Perlie Mayo, NP  fluticasone [facility]) 50 MCG/ACT nasal spray Place 1 spray into the nose daily. 07/02/11 11/27/11  Fayrene Helper, MD   Allergies  Allergen Reactions   Feraheme [Ferumoxytol] Shortness Of Breath and Swelling   Review of Systems  Physical Exam  Vital  Signs: BP 137/84   Pulse (!) 107   Temp 98.6 F (37 C) (Oral)   Resp (!) 35   Ht $R'5\' 5"'Hn$  (1.651 m)   Wt 50.3 kg   LMP 07/25/2016   SpO2 95%   BMI 18.45 kg/m  Pain Scale: 0-10   Pain Score: 0-No pain   SpO2: SpO2: 95 % O2 Device:SpO2: 95 % O2 Flow Rate: .O2 Flow Rate (L/min): 2 L/min  IO: Intake/output summary:  Intake/Output Summary (Last 24 hours) at 03/03/2022 1346 Last  data filed at 03/03/2022 1302 Gross per 24 hour  Intake 3125.36 ml  Output 1200 ml  Net 1925.36 ml    LBM: Last BM Date : 03/02/22 Baseline Weight: Weight: 64 kg Most recent weight: Weight: 50.3 kg     Palliative Assessment/Data:     Time In: *** Time Out: *** Time Total: *** Greater than 50%  of this time was spent counseling and coordinating care related to the above assessment and plan.  Signed by: Lane Hacker, DO   Please contact Palliative Medicine Team phone at [phone] for questions and concerns.  For individual provider: See Shea Evans

## 2022-03-03 NOTE — Progress Notes (Signed)
   03/03/22 1350  Vitals  Pulse Rate (!) 107  ECG Heart Rate (!) 114  Resp (!) 42  MEWS COLOR  MEWS Score Color Red  Oxygen Therapy  SpO2 (!) 89 %  Pain Assessment  Pain Scale 0-10  Pain Score 5  Pain Type Acute pain  Pain Location Abdomen  Pain Descriptors / Indicators Discomfort  Pain Intervention(s) Medication (See eMAR)  MEWS Score  MEWS Temp 0  MEWS Systolic 0  MEWS Pulse 2  MEWS RR 3  MEWS LOC 0  MEWS Score 5   Medicated with 2 mg morphine IV for air hunger and respirations 42.  Patient takes oxygen 3 LPM on and off despite RN reminders to keep oxygen in nares for ease of breathing.

## 2022-03-03 NOTE — Progress Notes (Addendum)
PROGRESS NOTE [name]  UXL:[id number] DOB: [birth year] DOA: 03/02/2022 PCP: Noreene Larsson, NP   Brief Narrative/Hospital Course: 39 yof  w/ CLL, depression, anxiety, HLD, HTN presented to [facility] ED on 6/2 unresponsive. On 6/2 patient called EMS for abdominal pain.  Patient initially was alert and talking but became unresponsive with AMS, hypoxic with agonal breathing and was placed on NRB, was noted to be hypotensive.In ED at [facility] remained unresponsive with agonal respirations and was intubated for airway protection and hypoxia.BP initially 81/65.  Given IV fluids.  Started on levo.  Fever 100.9 F and WBC 104.4.  Started on cefepime, Flagyl, Vanco.  Cultures pending.  Glucose 198, BNP 534, troponin 109 then 112, LA 4.4 then 1.8, UA unremarkable, UDS negative.  CXR with large left pleural effusion and small right pleural effusion.  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  CT abdomen with ascending colitis constipation, small intestine wall thickening possibly related to gastroenteritis; small volume ascites; extensive abdominopelvic and inguinal adenopathy.  PCCM consulted for ICU admission and transferred to [facility]. 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  Has been off pressors and subsequently transferred to [facility] service 6/6 Her encephalopathy was probably due to hypercarbia followed by oncology PCCM. 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  Chest tube continues to have a chylothorax managed by pulmonary  She has had recurrent chylothorax with ongoing chest tube and patient transferred to [facility] long 02/27/22 started radiation treatment to see whether this will help with lymphadenopathy from CLL. 6/15-slid up from the chair while trying to get up-PICC line got dislodged and reinserted no bruising- coccyx,l-spine xray neg. Rt sided chest tube got dislodged on 6/18 and pulmonary planning for pleurex  Subjective: Seen and examined this morning Overnight no fever, tachypneic upto 42, elink ordered  cxr and w/ similar mod left sided pleural effusion and left lung infiltrates.  Assessment and Plan: Principal Problem:   Acute respiratory failure with hypoxia (HCC) Active Problems:   Sepsis ([place])   CLL (chronic lymphocytic leukemia) (HCC)   Shock ([place])   Chronic diastolic heart failure ([place])   DNR (do not resuscitate)   Protein-calorie malnutrition, severe   Pressure injury of skin   Bilateral chylothorax   Small cell B-cell lymphoma (HCC)   Leukocytosis   DM type 2 (diabetes mellitus, type 2) ([place])  Acute respiratory failure with hypoxia-needing 2 L [place], cont same. Bilateral chylothorax, w/ persistent output: was being managed with chest tube in place by pulmonary> but got dislodged 6/18 early morning and [facility] plans for IR pleurex-IR consulted and discussed with IR and pulmonary team this morning.Remains tachypneic driving her [place].Continue octreotide 50 mcg tid in an attempt to slow chyle production. Hopefully XRT will help. Cont Walker o2. Palliative consulted.  Chest x-ray this morning with moderate left pleural effusion similar in appearance with adjacent infiltrate/atelectasis, stable right small pleural effusion with interval progression of opacities in the right lung base 3:40 pm: Addendum-informed by Dr Carlis Abbott- plan  is for small bore chest tube at bedside by Dr Carlis Abbott and not pleurex  at this time as she does not have enough support at home for pleurex.   Sepsis/Shock/Hypotension poa-vitals stable.S/p 5 days of antibiotic, no clear source was identified   CLL/SLL Significant leukocytosis: Oncology following.Continue pulse prednisone 50 mg daily started XRT 6/15, rituximab is being considered.Has persistent leukocytosis in the setting of CLL and also steroid.Wbc however appears to be downtrending,is afebrile. Monitor wbc, trend.  Unable to lay flat for radiation therapy today prior to Pleurx catheter. Recent Labs  Lab 02/25/22 0056 02/26/22 0950 02/27/22 0613 03/02/22 0841 03/03/22 0620  WBC 92.1* 86.8* 98.2* 84.9* 85.9*  Chronic diastolic heart failure: Compensated, got lasix iv x1 6/17,monitor closely.Net IO Since Admission: -6,124.48 mL [03/03/22 1215] . Filed Weights   03/09/2022 0744 02/26/22 2314 03/01/22 0251  Weight: 51.9 kg 50.6 kg 50.3 kg    Hyperkalemia resolved.  Hypokalemia repleted.  T2DM, new onset A1c 6.5, w/ uncontrolled hyperglycemia- and w/ hypoglycemia 64 on 6/14 pm after holding TPN.  Sugar controlled on ssi, semglee 8 units, cont same. Recent Labs  Lab 03/02/22 1618 03/02/22 2015 03/02/22 2354 03/03/22 0402 03/03/22 0720  GLUCAP 245* 240* 151* 213* 153*  Protein-calorie malnutrition, severe: Augment nutritional status, cont TPN, continue supplement  Nutrition Problem: Severe Malnutrition Etiology: chronic illness (CLL) Signs/Symptoms: severe muscle depletion, severe fat depletion Interventions: TPN  Deconditioning/debility: Mobilize slowly, remains dyspneic and tachypneic.Mostly anxious.  Pt slided to floor-unwitnessed from [place] few days ago- xray coccyx, l spine neg.   Goals of care currently DNR, I have discussed w/ son and understands overall guarded prognosis, PMT consulted. Although DNR she remained intubated on admission per PCCM.  Addendum: seen again at Bedside at 3 pm- spoke w/ RN and asked PCCM to address the pleural effusion  as she is still tachypneic, although able to speak in full sentence, at times agitated, taking off [place]- spo2 in 91% RA.  DVT prophylaxis: enoxaparin (LOVENOX) injection 40 mg Start: 02/16/22 1515 Code Status:   Code Status: DNR Family Communication: plan of care discussed with patient/ spoke w/ son on phone 6/18 Patient status is: inpatient because of need for xrt and CLL management. Level of care: Stepdown   Dispo: The patient is from: Home, independent.            Anticipated disposition: TBD  Mobility Assessment (last 72 hours)     Mobility Assessment     Row Name 03/02/22 2000           Does patient have an order for bedrest or is patient medically unstable No - Continue assessment       What is the highest level of mobility based on the progressive mobility assessment? Level 2 (Chair[place]) - Balance while sitting on edge of bed and cannot stand                 Objective: Vitals last 24 hrs: Vitals:   03/03/22 0400 03/03/22 0427 03/03/22 0500 03/03/22 0800  BP: (!) 164/96  (!) 141/114 (!) 126/93  Pulse: 86  91 93  Resp: (!) 29  (!) 30 (!) 31  Temp: 98.4 F (36.9 C) 98.4 F (36.9 C)  98.4 F (36.9 C)  TempSrc: Oral Oral  Oral  SpO2: 95%  96% 92%  Weight:      Height:       Weight change:   Physical Examination: General exam: AA, older than stated age, weak appearing. HEENT:Oral mucosa moist,  Ear/Nose WNL grossly, dentition normal. Respiratory system: bilaterally diminished breath sounds more on the left Cardiovascular system: S1 & S2 +, No JVD,. Gastrointestinal system: Abdomen soft,NT,ND,BS+ Nervous System:Alert, awake, moving extremities and grossly nonfocal Extremities: LE ankle edema neg, distal peripheral pulses palpable.  Skin: No rashes,no icterus. MSK: Normal muscle bulk,tone, power  PICC in LUE  Medications reviewed: Scheduled Meds:  chlorhexidine gluconate (MEDLINE KIT)  15 mL Mouth Rinse BID   Chlorhexidine Gluconate Cloth  6 each Topical  Daily   docusate sodium  100 mg Oral BID   enoxaparin (LOVENOX) injection  40 mg Subcutaneous Q24H   feeding supplement  1 Container Oral BID BM   insulin aspart  0-15 Units Subcutaneous Q4H   insulin glargine-yfgn  8 Units Subcutaneous Daily   octreotide  50 mcg Subcutaneous TID   polyethylene glycol  17 g Oral Daily   predniSONE  50 mg Oral Q breakfast   sodium chloride flush  10-40 mL Intracatheter Q12H   Continuous Infusions:  sodium chloride 10 mL/hr at 03/03/22 0400   TPN ADULT (ION) 100 mL/hr at 03/03/22 0400   TPN ADULT (ION)     Diet Order             Diet regular Room service appropriate? Yes; Fluid consistency: Thin  Diet effective now                    Nutrition Problem: Severe Malnutrition Etiology: chronic illness (CLL) Signs/Symptoms: severe muscle depletion, severe fat depletion Interventions: TPN   Intake/Output Summary (Last 24 hours) at 03/03/2022 1215 Last data filed at 03/03/2022 1000 Gross per 24 hour  Intake 2225.51 ml  Output 1200 ml  Net 1025.51 ml   Net IO Since Admission: -6,124.48 mL [03/03/22 1215]  Wt Readings from Last 3 Encounters:  03/01/22 50.3 kg  03/08/20 64.4 kg  02/07/20 68.5 kg     Unresulted Labs (From admission, onward)     Start     Ordered   03/02/22 0500  CBC  Daily,   R     Question:  Specimen collection method  Answer:  Unit=Unit collect   03/01/22 0729   02/27/22 0500  Comprehensive metabolic panel  (TPN Lab Panel)  Every Mon,Thu (0500),   R     Question:  Specimen collection method  Answer:  Unit=Unit collect   02/25/22 1228   02/27/22 0500  Magnesium  (TPN Lab Panel)  Every Mon,Thu (0500),   R     Question:  Specimen collection method  Answer:  Unit=Unit collect   02/25/22 1228   02/27/22 0500  Phosphorus  (TPN Lab Panel)  Every Mon,Thu (0500),   R     Question:  Specimen collection method  Answer:  Unit=Unit collect   02/25/22 1228   02/27/22 0500  Triglycerides  (TPN Lab Panel)  Every Mon,Thu (0500),   R      Question:  Specimen collection method  Answer:  Unit=Unit collect   02/25/22 1228   02/28/2022 1442  Acid Fast Culture with reflexed sensitivities  (AFB smear + Culture w reflexed sensitivities panel)  Once,   R       See Hyperspace for full Linked Orders Report.   02/21/2022 1441          Data Reviewed: I have personally reviewed following labs and imaging studies CBC: Recent Labs  Lab 02/25/22 0056 02/26/22 0950 02/27/22 0613 03/02/22 0841 03/03/22 0620  WBC 92.1* 86.8* 98.2* 84.9* 85.9*  NEUTROABS  7.0 6.1 7.1  --   --   HGB 11.0* 11.2* 12.3 11.2* 11.3*  HCT 36.3 35.8* 39.6 36.0 36.0  MCV 93.3 93.7 91.7 92.5 93.8  PLT 474* 508* 457* 291 203   Basic Metabolic Panel: Recent Labs  Lab 02/26/22 0341 02/27/22 0613 02/28/22 0422 02/28/22 0727 02/28/22 2032 03/01/22 0355 03/02/22 0841 03/03/22 0620  NA 138 137 135  --   --  136 139 140  K 4.7 4.7 5.6* 5.6* 4.4 3.8 3.1* 3.9  CL 101 103 100  --   --  103 102 104  CO2 33* 30 29  --   --  _0 GLUCOSE 128* 93 187*  --   --  158* 156* 96  BUN 21 28* 27*  --   --  27* 32* 31*  CREATININE 0.45 0.49 0.43*  --   --  0.37* 0.37* 0.31*  CALCIUM 8.1* 8.1* 7.5*  --   --  7.5* 7.9* 8.4*  MG 2.0 2.0 1.8  --   --  2.0  --  2.1  PHOS 3.2 1.9* 2.6  --   --  3.1  --  2.8   GFR: Estimated Creatinine Clearance: 58.6 mL/min (A) (by C-G formula based on SCr of 0.31 mg/dL (L)). Liver Function Tests: Recent Labs  Lab 02/26/22 0341 02/27/22 0613 03/03/22 0620  AST 36 33 32  ALT 39 35 31  ALKPHOS 69 71 74  BILITOT 0.3 0.3 0.3  PROT 4.6* 4.9* 5.1*  ALBUMIN 2.0* 2.2* 2.2*   No results for input(s): "LIPASE", "AMYLASE" in the last 168 hours. No results for input(s): "AMMONIA" in the last 168 hours. Coagulation Profile: No results for input(s): "INR", "PROTIME" in the last 168 hours. BNP (last 3 results) No results for input(s): "PROBNP" in the last 8760 hours. HbA1C: No results for input(s): "HGBA1C" in the last 72  hours. CBG: Recent Labs  Lab 03/02/22 1618 03/02/22 2015 03/02/22 2354 03/03/22 0402 03/03/22 0720  GLUCAP 245* 240* 151* 213* 153*   Lipid Profile: Recent Labs    03/03/22 0500  TRIG 92   Thyroid Function Tests: No results for input(s): "TSH", "T4TOTAL", "FREET4", "T3FREE", "THYROIDAB" in the last 72 hours. Sepsis Labs: No results for input(s): "PROCALCITON", "LATICACIDVEN" in the last 168 hours.  No results found for this or any previous visit (from the past 240 hour(s)).   Antimicrobials: Anti-infectives (From admission, onward)    Start     Dose/Rate Route Frequency Ordered Stop   02/17/22 1600  metroNIDAZOLE (FLAGYL) IVPB 500 mg        500 mg 100 mL/hr over 60 Minutes Intravenous Every 12 hours 02/17/22 0713 02/18/22 1926   02/16/22 1600  ceFEPIme (MAXIPIME) 2 g in sodium chloride 0.9 % 100 mL IVPB        2 g 200 mL/hr over 30 Minutes Intravenous Every 12 hours 02/16/22 0818 02/18/22 1847   02/16/22 1500  metroNIDAZOLE (FLAGYL) IVPB 500 mg  Status:  Discontinued        500 mg 100 mL/hr over 60 Minutes Intravenous Every 12 hours 02/16/22 1404 02/17/22 0711   02/15/22 1800  vancomycin (VANCOREADY) IVPB 1250 mg/250 mL  Status:  Discontinued        1,250 mg 166.7 mL/hr over 90 Minutes Intravenous Every 24 hours 02/16/2022 1955 02/15/22 0737   02/15/22 1415  metroNIDAZOLE (FLAGYL) IVPB 500 mg  Status:  Discontinued        500 mg 100 mL/hr over 60  Minutes Intravenous Every 12 hours 02/15/22 1325 02/16/22 0840   02/15/22 0400  ceFEPIme (MAXIPIME) 2 g in sodium chloride 0.9 % 100 mL IVPB  Status:  Discontinued        2 g 200 mL/hr over 30 Minutes Intravenous Every 8 hours 03/13/2022 1955 02/16/22 0817   03/03/2022 2000  vancomycin (VANCOREADY) IVPB 1250 mg/250 mL  Status:  Discontinued        1,250 mg 166.7 mL/hr over 90 Minutes Intravenous Every 24 hours 03/12/2022 1952 02/17/2022 1955   03/01/2022 1945  ceFEPIme (MAXIPIME) 2 g in sodium chloride 0.9 % 100 mL IVPB        2 g 200  mL/hr over 30 Minutes Intravenous  Once 03/01/2022 1931 03/04/2022 2051   03/14/2022 1945  metroNIDAZOLE (FLAGYL) IVPB 500 mg        500 mg 100 mL/hr over 60 Minutes Intravenous  Once 03/05/2022 1931 02/18/2022 2053   03/02/2022 1945  vancomycin (VANCOCIN) IVPB 1000 mg/200 mL premix  Status:  Discontinued        1,000 mg 200 mL/hr over 60 Minutes Intravenous  Once 03/01/2022 1931 02/22/2022 2053      Culture/Microbiology    Component Value Date/Time   SDES PLEURAL 02/13/2022 1435   SPECREQUEST NONE 02/19/2022 1435   CULT  03/09/2022 1435    NO GROWTH 3 DAYS Performed at [facility], [place] [address], [place], [place] [address]    REPTSTATUS 02/13/2022 FINAL 02/19/2022 1435    Other culture-see note  Radiology Studies: DG CHEST PORT 1 VIEW  Result Date: 03/03/2022 CLINICAL DATA:  Follow-up pleural effusion. EXAM: PORTABLE CHEST 1 VIEW COMPARISON:  Chest radiograph March 02, 2022 at 1145 hours FINDINGS: The heart size and mediastinal contours are largely obscured but appear unchanged. Aortic atherosclerosis. Similar size moderate left pleural effusion and left lung base atelectasis/infiltrate. Stable small right pleural effusion with interval progression of the right lung base opacities including a peripheral or nodular focus of opacification in the right lateral lung base. The visualized skeletal structures are unchanged. IMPRESSION: Similar moderate left pleural effusion with adjacent atelectasis/infiltrate. Stable small right pleural effusion with interval progression of the opacities in the right lung base. Electronically Signed   By: Dahlia Bailiff M.D.   On: 03/03/2022 08:14   DG CHEST PORT 1 VIEW  Result Date: 03/02/2022 CLINICAL DATA:  Follow-up pleural effusion. EXAM: PORTABLE CHEST 1 VIEW COMPARISON:  Chest radiograph dated 03/02/2022. FINDINGS: Left-sided PICC in similar position. Similar appearance of moderate size left pleural effusion and left lung base atelectasis or infiltrate.  Interval progression of right lung base opacities. No pneumothorax. Atherosclerotic calcification of the aorta. No acute osseous pathology. IMPRESSION: 1. Similar appearance of moderate size left pleural effusion and left lung base atelectasis or infiltrate. 2. Interval progression of right lung base opacities. Electronically Signed   By: Anner Crete M.D.   On: 03/02/2022 23:58   DG CHEST PORT 1 VIEW  Result Date: 03/02/2022 CLINICAL DATA:  Right chest tube fell out today. Follow-up large left pleural effusion. Evaluate for pneumothorax. EXAM: PORTABLE CHEST 1 VIEW COMPARISON:  Portable chest yesterday at 4:52 p.m. FINDINGS: 6:54 a.m., 03/02/2022. Left PICC remains in place with tip at the superior cavoatrial junction. Right chest tube is no longer seen. There is no visible pneumothorax. A large left pleural effusion again obscures the lower 2/3 of the left chest and is unchanged. Small to moderate right pleural effusion and overlying hazy interstitial consolidation are also similar. The  remaining right lung is clear. Heart size is stable , central vessels are slightly prominent without edema. Aortic atherosclerosis. Osteopenia and degenerative change both shoulders. IMPRESSION: Overall aeration is unchanged. A large left pleural effusion small to moderate right pleural effusion and overlying consolidation appear similar. No visible pneumothorax with right chest tube no longer seen. Electronically Signed   By: Telford Nab M.D.   On: 03/02/2022 07:34   DG Chest Port 1 View  Result Date: 03/01/2022 CLINICAL DATA:  Shortness of breath. EXAM: PORTABLE CHEST 1 VIEW COMPARISON:  02/28/2022 FINDINGS: No significant change in a large left pleural effusion. Increased patchy density in the adjacent left lung. Interval patchy density in the right lower lung zone. Slight increase in size of a small right pleural effusion. Left PICC tip in the region of the superior cavoatrial junction. A pill right basilar chest  tube is again noted. No pneumothorax. Grossly normal sized heart. Partially calcified thoracic aorta. Mild right and minimal left shoulder degenerative changes. IMPRESSION: 1. Interval patchy atelectasis or pneumonia in the right lower lung zone. 2. Increased patchy atelectasis or pneumonia in the aerated portion of the left lung inferiorly. 3. Stable large left pleural effusion and slight increase in size of a small right pleural effusion. Electronically Signed   By: Claudie Revering M.D.   On: 03/01/2022 17:10     LOS: 11 days   Antonieta Pert, MD [facility]  03/03/2022, 12:15 PM

## 2022-03-03 NOTE — Progress Notes (Signed)
Pts order for BIPAP is PRN. Pt respiratory status is stable at this time w/no distress noted pt on [place] 3 Lpm. RT will continue to monitor.

## 2022-03-03 NOTE — Progress Notes (Signed)
Reviewed case with Dr. Hilma Favors-- I am concerned she is not a good candidate for PleurX at this time. She reports she has no one at home to help manage these catheters. She also has a malignancy that should be significantly responsive to chemo and radiation and she may not need a semi-permanent catheter. Her discharge plan is to potentially go to SNF, and unfortunately not all SNFs will manage pleurX catheters. Before committing her to a semi-permanent catheter, it is reasonable to try to get her through her acute therapy with small bore chest tubes. I spoke to Ms. Hartzog and her son Audrie Gallus, who both agree.  Julian Hy, DO 03/03/22 3:34 PM [facility]

## 2022-03-03 NOTE — Progress Notes (Signed)
NAME:  [name], MRN:  [id number], DOB:  1961/09/10, LOS: 52 ADMISSION DATE:  03/07/2022, CONSULTATION DATE:  6/2 REFERRING MD:  Dr. Alvino Chapel, CHIEF COMPLAINT:  Unresponsive; Acute resp failure   BRIEF  Patient is a 61 year old female with pertinent PMH of CLL, depression, anxiety, HLD, HTN presents to [facility] ED on 6/2 unresponsive.  On 6/2 patient called EMS for abdominal pain.  Patient initially was alert and talking but became unresponsive with AMS.  Patient was hypoxic with agonal breathing and was placed on NRB.  Patient was noted to be hypotensive.  Patient transported to [facility] ED.  Upon arrival to [facility] ED patient remained unresponsive with agonal respirations.  Patient intubated for airway protection and hypoxia.  BP initially 81/65.  Given IV fluids.  Started on levo.  Fever 100.9 F and WBC 104.4.  Started on cefepime, Flagyl, Vanco.  Cultures pending.  Glucose 198, BNP 534, troponin 109 then 112, LA 4.4 then 1.8, UA unremarkable, UDS negative.  CXR with large left pleural effusion and small right pleural effusion.  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  CT abdomen with ascending colitis constipation, small intestine wall thickening possibly related to gastroenteritis; small volume ascites; extensive abdominopelvic and inguinal adenopathy.   PCCM was consulted for bilateral pleural effusion  Pertinent  Medical History    has a past medical history of Anxiety, Chronic abdominal pain, Chronic lymphocytic leukemia (CLL), B-cell ([place]) (04/04/2016), CLL (chronic lymphocytic leukemia) ([place]), Depression with anxiety (07/07/2015), Family history of hyperlipidemia (12/14/2019), H. 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   has a past surgical history that includes Surgery of left middle finger on left hand (childhood ); Tubal ligation; Cholecystectomy; Colonoscopy (02/07/2011); Hammer toe surgery (Left); Axillary lymph node biopsy (Right, 03/28/2016); Hysteroscopy with D & C (N/A, 07/30/2016); Thoracentesis (Left, 02/27/2022); and Chest tube insertion (N/A, 03/08/2022).     Significant Hospital Events: Including procedures, antibiotic start and stop dates in addition to other pertinent events   6/2: Patient admitted to [facility] ED unresponsive; intubated; shock started on levo; transferred to [facility]. CT chest and abd: 1. Extensive abdominopelvic and inguinal adenopathy, most likely related to lymphoma/leukemia. Numerous small hypodense splenic lesions versus bolus phase phenomenon. Intrahepatic periportal edema which is probably either due to fluid overload or hepatic dysfunction. The portal vein is patent within normal caliber limits. Chronic calcific pancreatitis but no findings of acute pancreatitis or ductal dilatation.  Ascending colitis versus nondistention, remainder of the colon with constipation. Diffuse small intestinal wall thickening with gastric fold thickening, could relate to gastroenteritis or hepatic dysfunction versus reactive from ascites versus congestive or infiltrating disease. 6/3 right chest tube placed. Left thora 2000 ml removed - milky 6/4 extubated -exudative, cx negative, lymphocytic. 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 Off pressors 6/6 Transferred to [facility] 6/6 ultrasound-guided right axillary lymph node biopsy -Started on pulse dose steroids at 50 mg prednisone 6/7 chest tube output over a liter 6/8 left thoracentesis for 1400 cc of fluid 6/10 - chest tube removed 6/12 - s/p right chest tube placement with 3.1 L drained  6/3 - Transferred from [facility] to [facility] 6/13 for initiation of palliative radiotherapy 02/26/22 - No chest pain. Saturation 96% on 2 L.  Chest tube drained 190 cc  of milky fluid 02/27/22 - now at [facility] long hospital bed 1610. Per RN - fall earlier today . CXR stable. Doing well. Got SIM for XRT.  On room air. HAs Chest tube right side draining chylothorax. Has mod-large effusion on the left side -refused chest tube.  Remains on octreiotice 6/16 - on TPN.  On [place] O2 . > 1000 cc rt sided chest tube output.  pulse prednisone 50 mg daily started XRT 6/15, rituximab is being considered 6/17 -she is stable with the same amount of oxygen but right-sided chest tube is somewhat withdrawn and also cannot.  Nevertheless it still draining chylothorax.  She feels the same.  She is unsure if she wants chest tube reinserted on the right side if this chest tube was to follow-up.  Definitely does not want chest tube on the left side. 6/18 dislodged right chest tube completely.   Interim History / Subjective:   Dyspneic today. Productive cough. Green sputum with occasional hemoptysis.   Objective   Blood pressure (!) 126/93, pulse 93, temperature 98.4 F (36.9 C), temperature source Oral, resp. rate (!) 31, height '5\' 5"'$  (1.651 m), weight 50.3 kg, last menstrual period 07/25/2016, SpO2 92 %.        Intake/Output Summary (Last 24 hours) at 03/03/2022 1023 Last data filed at 03/03/2022 1000 Gross per 24 hour  Intake 2225.51 ml  Output 1200 ml  Net 1025.51 ml    Filed Weights   02/25/2022 0744 02/26/22 2314 03/01/22 0251  Weight: 51.9 kg 50.6 kg 50.3 kg   Physical Exam:  General:  Thin, frail female. Appears older than stated age.  Neuro:  Alert, oriented, non-focal.  HEENT:  [place]/AT, PERRL, no JVD Cardiovascular:  RRR, no MRG. No edema.  Lungs:  Clear on the right. Left apex is diminished, absent breath sounds distal. In mild/moderate distress following coughing spells. Sputum is clear on my eval.  Abdomen:  Soft, non-distended, non-tender.  Musculoskeletal:  No acute deformity or ROM limitation.  Skin:  Intact, MMM     Assessment & Plan:    Acute hypoxemic respiratory failure secondary to bilateral pleural effusions.  Bilateral chylothorax: given known CLL history now with bulky mediastinal lymphadenopathy. Left thoracentesis 6/8 with 1464m drained. Right chest tube 6/3 with heavy output. Removed 6/10. Replaced 6/12 with 3+ liters of output. She had been hesitant regarding chet tube on the L because having the right sided tube in was uncomfortable.  - Supplemental O2 to keep sats > 92% - increased to 4L by me for comfort. Sats were OK in low 90s on 2L.  - needs left effusion drained. She is now open to the idea of a tube. IR has been consulted for PleurX. May need drainage for symptomatic relief sooner if they are unable to see her today.  - Rad onc following for palliative radiation to the mediastinum. Planning for a two week course.  - continuie  octreotide to 50 mcg 3 times daily  in attempt to slow chyle production  - Hopeful radiation will help   CLL/SLL with bulky mediastinal adenopathy - primary issue - thoracentesis on 02/15/22 showed atypical lymphocytes suspicious for lymphoproliferative process,  -  right axillary node was biopsies on 02/18/22 showing B cells consistent with her history of lymphoma and CLL - Onc consult  02/16/22 Dr DLorenso Courier - XRT consult 02/27/22  Dr MLisbeth Renshaw Plan -Per oncology, on prednisone Rituximab being considered aos 02/27/22   Protein calorie malnutrition, moderate to severe - per triad -  TPN  Chronic HFpEF due to uncontrolled hypertension - per [facility]     Best practice:  Per Ovid, AGACNP-BC [facility] for personal pager PCCM on call pager [phone] until 7pm. Please call Elink 7p-7a. [phone]  03/03/2022 10:23 AM

## 2022-03-03 NOTE — Procedures (Signed)
Insertion of Chest Tube Procedure Note  KAREE FORGE  [id number]  [birth year]  Date:03/03/22  Time:5:34 PM    Provider Performing: Corey Harold   Procedure: Chest Tube Insertion [phone])  Indication(s) Effusion  Consent Risks of the procedure as well as the alternatives and risks of each were explained to the patient and/or caregiver.  Consent for the procedure was obtained and is signed in the bedside chart  Anesthesia Topical only with 1% lidocaine    Time Out Verified patient identification, verified procedure, site/side was marked, verified correct patient position, special equipment/implants available, medications/allergies/relevant history reviewed, required imaging and test results available.   Sterile Technique Maximal sterile technique including full sterile barrier drape, hand hygiene, sterile gown, sterile gloves, mask, hair covering, sterile ultrasound probe cover (if used).   Procedure Description Ultrasound used to identify appropriate pleural anatomy for placement and overlying skin marked. Area of placement cleaned and draped in sterile fashion.  A 14 French pigtail pleural catheter was placed into the right pleural space using Seldinger technique. Appropriate return of fluid was obtained.  The tube was connected to atrium and placed on -20 cm H2O wall suction.   Complications/Tolerance None; patient tolerated the procedure well. Chest X-ray is ordered to verify placement.   EBL Minimal  Specimen(s) none    Wire in right pleural space   Georgann Housekeeper, AGACNP-BC [facility]  See Amion for personal pager PCCM on call pager [phone] until 7pm. Please call Elink 7p-7a. [phone]  03/03/2022 5:36 PM

## 2022-03-03 NOTE — TOC Progression Note (Addendum)
Transition of Care [facility]) - Progression Note    Patient Details  Name: [name] MRN: [id number] Date of Birth: 1960/10/25  Transition of [facility]) CM/SW Contact  Leeroy Cha, RN Phone Number: 03/03/2022, 8:10 AM  Clinical Narrative:    No bed offers at this time.  4 pending and no denials.  Will follow for bed placement. Cm noticed that there was no passar number or fl2 these completed and sent out to area providers.    Expected Discharge Plan: Long Term Nursing Home Barriers to Discharge: Continued Medical Work up, Ship broker, Other (must enter comment) (LTC placement)  Expected Discharge Plan and Services Expected Discharge Plan: [place]     Post Acute Care Choice: Nursing Home Living arrangements for the past 2 months: Single Family Home                                       Social Determinants of Health (SDOH) Interventions    Readmission Risk Interventions     No data to display

## 2022-03-03 NOTE — Progress Notes (Addendum)
Interventional Radiology Brief Note:  IR consulted for PleurX placement in patient with recurrent, bilateral, large volume pleural effusions.    Patient with history of CLL, now with respiratory distress. She did have a right-sided chest tube placed several days ago which drained >5L since placement.  This unfortunately became dislodged and PleurX requested.  Patient was approved and initially agreeable to placement, however after further discussion with patient, son, and medical team PleurX placement on hold.     Order canceled. Please reconsult IR if needed.  Brynda Greathouse, MS RD PA-C

## 2022-03-03 NOTE — NC FL2 (Signed)
[place] MEDICAID FL2 LEVEL OF CARE SCREENING TOOL     IDENTIFICATION  Patient Name: [name] Birthdate: May 27, 1961 Sex: female Admission Date (Current Location): 03/12/2022  [facility] and [place] Number:  Herbalist and Address:  [facility],  [place] [address], [place]      Provider Number: [id number]  Attending Physician Name and Address:  Antonieta Pert, MD  Relative Name and Phone Number:       Current Level of Care: Hospital Recommended Level of Care: [place] Prior Approval Number:    Date Approved/Denied:   PASRR Number: [id number]  Discharge Plan: SNF    Current Diagnoses: Patient Active Problem List   Diagnosis Date Noted   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   Leukocytosis 02/27/2022   DM type 2 (diabetes mellitus, type 2) ([place]) 02/27/2022   Protein-calorie malnutrition, severe 02/18/2022   Pressure injury of skin 02/18/2022   Alteration in electrolyte and fluid balance 02/17/2022   Chronic diastolic heart failure ([place]) 02/17/2022   DNR (do not resuscitate) 02/17/2022   Acute respiratory failure with hypoxia ([place]) 02/15/2022   CLL (chronic lymphocytic leukemia) ([place])    Shock ([place])    Bilateral pleural effusion    Sepsis ([place]) 02/17/2022   Encounter for screening for malignant neoplasm of cervix 02/08/2020   Encounter for screening for malignant neoplasm of breast 02/08/2020   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   Leg swelling 02/08/2020   Impaired fasting glucose 12/13/2019   Major depressive disorder, recurrent episode, moderate degree ([place]) 01/26/2017   Iron deficiency 11/17/2016   Tibial plateau fracture, left, closed, initial encounter 09/16/2016   PVD (peripheral vascular disease) ([place]) 07/27/2016   Chronic lymphocytic leukemia (CLL), B-cell ([place]) 04/04/2016   Mass of right side of neck 01/21/2016   Lymphadenopathy 01/17/2016   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   Rheumatoid arthritis ([place]) 02/12/2012   Vitamin D deficiency 08/10/2010   NICOTINE ADDICTION 04/29/2010   Essential hypertension 11/24/2007    Orientation RESPIRATION BLADDER Height & Weight     Self, Time, Situation, Place  Normal Continent Weight: 50.3 kg Height:  $Remove'5\' 5"'mrEToDj$  (165.1 cm)  BEHAVIORAL SYMPTOMS/MOOD NEUROLOGICAL BOWEL NUTRITION STATUS      Continent Diet (regular)  AMBULATORY STATUS COMMUNICATION OF NEEDS Skin   Extensive Assist Verbally Normal                       Personal Care Assistance Level of Assistance  Bathing, Feeding, Dressing Bathing Assistance: Limited assistance Feeding assistance: Limited assistance Dressing Assistance: Limited assistance     Functional Limitations Info  Sight, Hearing, Speech Sight Info: Adequate Hearing Info: Adequate Speech Info: Adequate    SPECIAL CARE FACTORS FREQUENCY  PT (By licensed PT), OT (By licensed OT)     PT Frequency: 5 x weekly OT Frequency: 5 x weekly            Contractures Contractures Info: Not present    Additional Factors Info  Code Status Code Status Info: DNR             Current Medications (03/03/2022):  This is the current hospital active medication list Current Facility-Administered Medications  Medication Dose Route Frequency Provider Last Rate Last Admin   0.9 %  sodium chloride infusion  250 mL Intravenous Continuous Olalere, Adewale A, MD   Stopped at 03/03/22 1302   acetaminophen (TYLENOL) tablet 650 mg  650 mg Per Tube Q4H PRN Olalere,  Adewale A, MD   650 mg at 03/03/22 1052   chlorhexidine gluconate (MEDLINE KIT) (PERIDEX) 0.12 % solution 15 mL  15 mL Mouth Rinse BID Olalere, Adewale A, MD   15 mL at 03/02/22 2058   Chlorhexidine Gluconate Cloth 2 % PADS 6 each  6 each Topical Daily Eugenie Filler, MD   6 each at 03/03/22 1052   docusate sodium (COLACE) capsule 100 mg  100 mg Oral BID Olalere, Adewale A, MD   100 mg at 03/03/22 1052   enoxaparin (LOVENOX)  injection 40 mg  40 mg Subcutaneous Q24H Olalere, Adewale A, MD   40 mg at 03/02/22 1431   feeding supplement (BOOST / RESOURCE BREEZE) liquid 1 Container  1 Container Oral BID BM Kc, Ramesh, MD   1 Container at 03/03/22 1303   insulin aspart (novoLOG) injection 0-15 Units  0-15 Units Subcutaneous Q4H Kc, Ramesh, MD   5 Units at 03/03/22 1259   [START ON 03/04/2022] insulin glargine-yfgn (SEMGLEE) injection 10 Units  10 Units Subcutaneous Daily Kc, Ramesh, MD       LORazepam (ATIVAN) injection 0.5 mg  0.5 mg Intravenous Q4H PRN Lane Hacker L, DO       morphine (PF) 2 MG/ML injection 2 mg  2 mg Intravenous Q1H PRN Lane Hacker L, DO   2 mg at 03/03/22 1350   octreotide (SANDOSTATIN) injection 50 mcg  50 mcg Subcutaneous TID Eugenie Filler, MD   50 mcg at 03/03/22 1052   ondansetron (ZOFRAN) injection 4 mg  4 mg Intravenous Q6H PRN Olalere, Adewale A, MD   4 mg at 02/27/22 1159   polyethylene glycol (MIRALAX / GLYCOLAX) packet 17 g  17 g Oral Daily Olalere, Adewale A, MD   17 g at 02/27/22 1201   predniSONE (DELTASONE) tablet 50 mg  50 mg Oral Q breakfast Olalere, Adewale A, MD   50 mg at 03/03/22 0835   sodium chloride flush (NS) 0.9 % injection 10-40 mL  10-40 mL Intracatheter Q12H Eugenie Filler, MD   10 mL at 03/02/22 2254   sodium chloride flush (NS) 0.9 % injection 10-40 mL  10-40 mL Intracatheter PRN Eugenie Filler, MD   10 mL at 02/27/22 1811   TPN ADULT (ION)   Intravenous Continuous TPN Tawnya Crook, RPH 100 mL/hr at 03/03/22 1302 Infusion Verify at 03/03/22 1302   TPN ADULT (ION)   Intravenous Continuous TPN Tawnya Crook, [facility]         Discharge Medications: Please see discharge summary for a list of discharge medications.  Relevant Imaging Results:  Relevant Lab Results:   Additional Information SSN:[id number]  Leeroy Cha, RN

## 2022-03-03 NOTE — Progress Notes (Signed)
Escorted patient to Radiation appointment. Patient remained on 3-5 LPM via nasal cannula. Transferred to procedure table and patient unable to lay down to complete radiation treatment due to breathlessness, dyspnea at rest and on exertion and fullness in chest. Radiation treatment cancelled for today due to patient condition.  Transported back to room 1234 with Transporter, settled back into room.

## 2022-03-04 ENCOUNTER — Other Ambulatory Visit: Payer: Self-pay

## 2022-03-04 ENCOUNTER — Inpatient Hospital Stay (HOSPITAL_COMMUNITY): Payer: Medicaid Other

## 2022-03-04 ENCOUNTER — Ambulatory Visit
Admit: 2022-03-04 | Discharge: 2022-03-04 | Disposition: A | Discharge status: Home / Self Care | Payer: Medicaid Other | Attending: Radiation Oncology | Admitting: Radiation Oncology

## 2022-03-04 DIAGNOSIS — J940 Chylous effusion: Secondary | ICD-10-CM | POA: Diagnosis not present

## 2022-03-04 DIAGNOSIS — C9110 Chronic lymphocytic leukemia of B-cell type not having achieved remission: Secondary | ICD-10-CM | POA: Diagnosis not present

## 2022-03-04 DIAGNOSIS — J9601 Acute respiratory failure with hypoxia: Secondary | ICD-10-CM | POA: Diagnosis not present

## 2022-03-04 LAB — GLUCOSE, CAPILLARY
Glucose-Capillary: 158 mg/dL — ABNORMAL HIGH (ref 70–99)
Glucose-Capillary: 78 mg/dL (ref 70–99)
Glucose-Capillary: 217 mg/dL — ABNORMAL HIGH (ref 70–99)
Glucose-Capillary: 174 mg/dL — ABNORMAL HIGH (ref 70–99)
Glucose-Capillary: 151 mg/dL — ABNORMAL HIGH (ref 70–99)
Glucose-Capillary: 182 mg/dL — ABNORMAL HIGH (ref 70–99)

## 2022-03-04 LAB — BODY FLUID CELL COUNT WITH DIFFERENTIAL
Lymphs, Fluid: 67 %
Lymphs, Fluid: 90 %
Monocyte-Macrophage-Serous Fluid: 4 % — ABNORMAL LOW (ref 50–90)
Monocyte-Macrophage-Serous Fluid: 5 % — ABNORMAL LOW (ref 50–90)
Neutrophil Count, Fluid: 29 % — ABNORMAL HIGH (ref 0–25)
Neutrophil Count, Fluid: 5 % (ref 0–25)
Total Nucleated Cell Count, Fluid: 1566 cu mm — ABNORMAL HIGH (ref 0–1000)
Total Nucleated Cell Count, Fluid: 2592 cu mm — ABNORMAL HIGH (ref 0–1000)

## 2022-03-04 LAB — CBC
HCT: 27.1 % — ABNORMAL LOW (ref 36.0–46.0)
Hemoglobin: 8.6 g/dL — ABNORMAL LOW (ref 12.0–15.0)
MCH: 30 pg (ref 26.0–34.0)
MCHC: 31.7 g/dL (ref 30.0–36.0)
MCV: 94.4 fL (ref 80.0–100.0)
Platelets: 194 10*3/uL (ref 150–400)
RBC: 2.87 MIL/uL — ABNORMAL LOW (ref 3.87–5.11)
RDW: 18.9 % — ABNORMAL HIGH (ref 11.5–15.5)
WBC: 49.7 10*3/uL — ABNORMAL HIGH (ref 4.0–10.5)
nRBC: 0 % (ref 0.0–0.2)

## 2022-03-04 LAB — RAD ONC ARIA SESSION SUMMARY
Course Elapsed Days: 5
Plan Fractions Treated to Date: 3
Plan Prescribed Dose Per Fraction: 2.5 Gy
Plan Total Fractions Prescribed: 10
Plan Total Prescribed Dose: 25 Gy
Reference Point Dosage Given to Date: 7.5 Gy
Reference Point Session Dosage Given: 2.5 Gy
Session Number: 3

## 2022-03-04 MED ORDER — ALBUMIN HUMAN 25 % IV SOLN
50.0000 g | Freq: Once | INTRAVENOUS | Status: AC
  Administered 2022-03-04: 50 g via INTRAVENOUS
  Filled 2022-03-04: qty 200

## 2022-03-04 MED ORDER — TRAVASOL 10 % IV SOLN
INTRAVENOUS | Status: AC
  Filled 2022-03-04: qty 1500

## 2022-03-04 MED ORDER — LACTATED RINGERS IV BOLUS
500.0000 mL | Freq: Once | INTRAVENOUS | Status: AC
  Administered 2022-03-04: 500 mL via INTRAVENOUS

## 2022-03-04 NOTE — Progress Notes (Signed)
PROGRESS NOTE JUNIPER COBEY  KCM:[id number] DOB: Jan 19, 1961 DOA: 02/20/2022 PCP: Noreene Larsson, NP   Brief Narrative/Hospital Course: 42 yof  w/ CLL, depression, anxiety, HLD, HTN presented to [facility] ED on 6/2 unresponsive. On 6/2 patient called EMS for abdominal pain.  Patient initially was alert and talking but became unresponsive with AMS, hypoxic with agonal breathing and was placed on NRB, was noted to be hypotensive.In ED at [facility] remained unresponsive with agonal respirations and was intubated for airway protection and hypoxia.BP initially 81/65.  Given IV fluids.  Started on levo.  Fever 100.9 F and WBC 104.4.  Started on cefepime, Flagyl, Vanco.  Cultures pending.  Glucose 198, BNP 534, troponin 109 then 112, LA 4.4 then 1.8, UA unremarkable, UDS negative.  CXR with large left pleural effusion and small right pleural effusion.  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  CT abdomen with ascending colitis constipation, small intestine wall thickening possibly related to gastroenteritis; small volume ascites; extensive abdominopelvic and inguinal adenopathy.  PCCM consulted for ICU admission and transferred to [facility]. 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  Has been off pressors and subsequently transferred to [facility] service 6/6 Her encephalopathy was probably due to hypercarbia followed by oncology PCCM. 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  Chest tube continues to have a chylothorax managed by pulmonary  She has had recurrent chylothorax with ongoing chest tube and patient transferred to [facility] long 02/27/22 started radiation treatment to see whether this will help with lymphadenopathy from CLL. 6/15-slid up from the chair while trying to get up-PICC line got dislodged and reinserted no bruising- coccyx,l-spine xray neg. 6/18 am-chest tube got dislodged patient reluctant for reinsertion, subsequent implant PleurX catheter for 6/19.Patient with ongoing tachypnea having worsening respiratory disease, unable to lay flat for radiation therapy and had further discussion by PCCM underwent bilateral chest tube placement 6/19 as bedside, at the Pleurx may not be ideal as she will not be able to take care of this at home and will have difficult placement with Pleurx.  Subjective: Seen examined Patient reports she is not short of breath anymore she had a good night slept well feels much improved after chest tubes Tachypnea has been improving, leukocytosis significantly improved- halved Obliterated nasal cannula, afebrile Had significant chest tubes output  3.8 l/. One tube clapmed Hypotension overnight in 90s-started on midodrine  Assessment and Plan: Principal Problem:   Acute respiratory failure with hypoxia (HCC) Active Problems:   Sepsis ([place])   CLL (chronic lymphocytic leukemia) (HCC)   Shock ([place])   Chronic diastolic heart failure ([place])   DNR (do not resuscitate)   Protein-calorie malnutrition, severe   Pressure injury of skin   Bilateral chylothorax   Small cell B-cell lymphoma (HCC)   Leukocytosis   DM type 2 (diabetes mellitus, type 2) ([place])  Acute respiratory failure with hypoxia,Initially on vent on admit > on [place] now,stable. Bilateral chylothorax, w/ persistent output: Respiratory status/respiratory failure due to bilateral chylothorax in  the setting of CLL.  Had chest tube  in place on rt- but 03/02/22 am-got dislodged patient reluctant for reinsertion, subsequent implant PleurX catheter for 6/19.Patient with ongoing tachypnea having worsening respiratory disease, unable to lay flat for radiation therapy and had further discussion by PCCM underwent bilateral chest tube placement 6/19 as bedside, at the Pleurx may not be ideal as she will not be able to take care of this at home and will have difficult placement with Pleurx.  Having significant output.  Being managed by PCCM.  On octreotide in attempt to decrease chyle production  Sepsis/Shock/Hypotension poa:S/p 5 days of antibiotic, no clear source was identified.  Having hypotension after bilateral chest tube placement 6/20 a.m. and placed on midodrine  CLL/SLL Significant leukocytosis: Oncology following> currently on prednisone 50 mg daily and XRT started 6/15> unable to get radiation 6/19 and unable to lay flat> now with chest tubes in place hopefully will be able to continue radiation therapy.  Dr. Lorenso Courier radiation oncology team following.  Had significant leukocytosis which is now coming down nicely.  Monitor Recent Labs  Lab 02/26/22 0950 02/27/22 0613 03/02/22 0841 03/03/22 0620 03/04/22 0447  WBC 86.8* 98.2* 84.9* 85.9* 49.7*  Chronic diastolic heart failure: Compensated, got lasix iv x1 6/17,monitor closely.Net IO Since Admission: -9,171.39 mL [03/04/22 0717] . Filed Weights   02/26/22 2314 03/01/22 0251 03/04/22 0432  Weight: 50.6 kg 50.3 kg 51.2 kg    Hyperkalemia resolved.  Hypokalemia repleted.  T2DM, new onset A1c 6.5, w/ uncontrolled hyperglycemia- and episode of hypoglycemia while holding TPN.  Currently blood sugar stable continue Semglee 10 units, SSI, monitor. Recent Labs  Lab 03/03/22 1222 03/03/22 1756 03/03/22 1934 03/03/22 2334 03/04/22 0412  GLUCAP 223* 189* 178* 119* 158*   Protein-calorie malnutrition, severe: Augment nutritional status, cont TPN, continue supplement   Nutrition Problem: Severe Malnutrition Etiology: chronic illness (CLL) Signs/Symptoms: severe muscle depletion, severe fat depletion Interventions: TPN  Deconditioning/debility: PT OT as tolerated but limited due to chest tubes.Pt slided to floor-unwitnessed from [place] few several days ago- xray coccyx, L-spine neg.   Goals of care currently DNR, I have discussed w/ son and understands overall prognosis remains to be seen.  Palliative care following closely.  With ongoing radiation therapy and high-dose steroid hoping that CLL as well as chylothorax will get better so that patient can be discharged to skilled nursing facility but if it does not improve we will need to continue on goals of care.  Palliative care following.    DVT prophylaxis: enoxaparin (LOVENOX) injection 40 mg Start: 02/16/22 1515 Code Status:   Code Status: DNR Family Communication: plan of care discussed with patient/ spoke w/ son on phone 6/18, udpated by [facility] 6/19 Patient status is: inpatient because of need for xrt and CLL management. Level of care: Stepdown   Dispo: The patient is from: Home, independent.            Anticipated disposition: We will need a skilled nursing facility    Mobility Assessment (last 72 hours)     Mobility Assessment     Row Name 03/03/22 2000 03/03/22 0800 03/02/22 2000       Does patient have an order for bedrest or is patient medically unstable No - Continue assessment No - Continue assessment No - Continue assessment     What is the highest level of mobility based on the progressive mobility assessment? Level 2 (Chairfast) - Balance while sitting on edge of bed and cannot stand Level 2 (  Chairfast) - Balance while sitting on edge of bed and cannot stand Level 2 (Chairfast) - Balance while sitting on edge of bed and cannot stand     Is the above level different from baseline mobility prior to current illness? Yes - Recommend PT order Yes - Recommend PT order --                Objective: Vitals last 24 hrs: Vitals:   03/04/22 0300 03/04/22 0400 03/04/22 0432 03/04/22 0500  BP: (!) 98/52 (!) 101/52 100/62 109/60  Pulse: 85 81 85 99  Resp: 20 (!) 21 (!) 25 20  Temp:  98.6 F (37 C)    TempSrc:  Axillary    SpO2: 97% 98% 99% 100%  Weight:   51.2 kg   Height:       Weight change:   Physical Examination: General exam: AA0X3, ill looking, frail, thin older than stated age, weak appearing. HEENT:Oral mucosa moist, Ear/Nose WNL grossly, dentition normal. Respiratory system: bilaterally diminished at best clear lung sounds on upper airways, chest tube placement bilateral chest, no use of accessory muscle Cardiovascular system: S1 & S2 +, No JVD,. Gastrointestinal system: Abdomen soft,NT,ND,BS+ Nervous System:Alert, awake, moving extremities and grossly nonfocal Extremities: LE ankle edema neg, distal peripheral pulses palpable.  Skin: No rashes,no icterus. MSK: Normal muscle bulk,tone, power  Lue picc+  Medications reviewed: Scheduled Meds:  chlorhexidine gluconate (MEDLINE KIT)  15 mL Mouth Rinse BID   Chlorhexidine Gluconate Cloth  6 each Topical Daily   docusate sodium  100 mg Oral BID   enoxaparin (LOVENOX) injection  40 mg Subcutaneous Q24H   feeding supplement  1 Container Oral BID BM   insulin aspart  0-15 Units Subcutaneous Q4H   insulin glargine-yfgn  10 Units Subcutaneous Daily   midodrine  5 mg Oral TID WC   octreotide  50 mcg Subcutaneous TID   polyethylene glycol  17 g Oral Daily   predniSONE  50 mg Oral Q breakfast   sodium chloride flush  10 mL Other Q8H   sodium chloride flush  10 mL Other Q8H   sodium chloride flush  10-40 mL Intracatheter Q12H   Continuous Infusions:  sodium chloride Stopped (03/03/22 1302)   TPN ADULT (ION) 100 mL/hr at 03/03/22 1735   Diet Order             Diet regular Room service appropriate? Yes; Fluid consistency: Thin  Diet effective now                    Nutrition Problem: Severe  Malnutrition Etiology: chronic illness (CLL) Signs/Symptoms: severe muscle depletion, severe fat depletion Interventions: TPN   Intake/Output Summary (Last 24 hours) at 03/04/2022 0717 Last data filed at 03/04/2022 0600 Gross per 24 hour  Intake 2398.09 ml  Output 5325 ml  Net -2926.91 ml   Net IO Since Admission: -9,171.39 mL [03/04/22 0717]  Wt Readings from Last 3 Encounters:  03/04/22 51.2 kg  03/08/20 64.4 kg  02/07/20 68.5 kg     Unresulted Labs (From admission, onward)     Start     Ordered   03/02/22 0500  CBC  Daily,   R     Question:  Specimen collection method  Answer:  Unit=Unit collect   03/01/22 0729   02/27/22 0500  Comprehensive metabolic panel  (TPN Lab Panel)  Every Mon,Thu (0500),   R     Question:  Specimen collection method  Answer:  Unit=Unit collect  02/25/22 1228   02/27/22 0500  Magnesium  (TPN Lab Panel)  Every Mon,Thu (0500),   R     Question:  Specimen collection method  Answer:  Unit=Unit collect   02/25/22 1228   02/27/22 0500  Phosphorus  (TPN Lab Panel)  Every Mon,Thu (0500),   R     Question:  Specimen collection method  Answer:  Unit=Unit collect   02/25/22 1228   02/27/22 0500  Triglycerides  (TPN Lab Panel)  Every Mon,Thu (0500),   R     Question:  Specimen collection method  Answer:  Unit=Unit collect   02/25/22 1228   02/17/2022 1442  Acid Fast Culture with reflexed sensitivities  (AFB smear + Culture w reflexed sensitivities panel)  Once,   R       See Hyperspace for full Linked Orders Report.   03/06/2022 1441          Data Reviewed: I have personally reviewed following labs and imaging studies CBC: Recent Labs  Lab 02/26/22 0950 02/27/22 0613 03/02/22 0841 03/03/22 0620 03/04/22 0447  WBC 86.8* 98.2* 84.9* 85.9* 49.7*  NEUTROABS 6.1 7.1  --   --   --   HGB 11.2* 12.3 11.2* 11.3* 8.6*  HCT 35.8* 39.6 36.0 36.0 27.1*  MCV 93.7 91.7 92.5 93.8 94.4  PLT 508* 457* 291 297 644   Basic Metabolic Panel: Recent Labs  Lab  02/26/22 0341 02/27/22 0613 02/28/22 0422 02/28/22 0727 02/28/22 2032 03/01/22 0355 03/02/22 0841 03/03/22 0620  NA 138 137 135  --   --  136 139 140  K 4.7 4.7 5.6* 5.6* 4.4 3.8 3.1* 3.9  CL 101 103 100  --   --  103 102 104  CO2 33* 30 29  --   --  $R'31 31 30  'qa$ GLUCOSE 128* 93 187*  --   --  158* 156* 96  BUN 21 28* 27*  --   --  27* 32* 31*  CREATININE 0.45 0.49 0.43*  --   --  0.37* 0.37* 0.31*  CALCIUM 8.1* 8.1* 7.5*  --   --  7.5* 7.9* 8.4*  MG 2.0 2.0 1.8  --   --  2.0  --  2.1  PHOS 3.2 1.9* 2.6  --   --  3.1  --  2.8   GFR: Estimated Creatinine Clearance: 59.7 mL/min (A) (by C-G formula based on SCr of 0.31 mg/dL (L)). Liver Function Tests: Recent Labs  Lab 02/26/22 0341 02/27/22 0613 03/03/22 0620  AST 36 33 32  ALT 39 35 31  ALKPHOS 69 71 74  BILITOT 0.3 0.3 0.3  PROT 4.6* 4.9* 5.1*  ALBUMIN 2.0* 2.2* 2.2*   No results for input(s): "LIPASE", "AMYLASE" in the last 168 hours. No results for input(s): "AMMONIA" in the last 168 hours. Coagulation Profile: No results for input(s): "INR", "PROTIME" in the last 168 hours. BNP (last 3 results) No results for input(s): "PROBNP" in the last 8760 hours. HbA1C: No results for input(s): "HGBA1C" in the last 72 hours. CBG: Recent Labs  Lab 03/03/22 1222 03/03/22 1756 03/03/22 1934 03/03/22 2334 03/04/22 0412  GLUCAP 223* 189* 178* 119* 158*   Lipid Profile: Recent Labs    03/03/22 0500  TRIG 92   Thyroid Function Tests: No results for input(s): "TSH", "T4TOTAL", "FREET4", "T3FREE", "THYROIDAB" in the last 72 hours. Sepsis Labs: No results for input(s): "PROCALCITON", "LATICACIDVEN" in the last 168 hours.  No results found for this or any previous visit (from  the past 240 hour(s)).   Antimicrobials: Anti-infectives (From admission, onward)    Start     Dose/Rate Route Frequency Ordered Stop   02/17/22 1600  metroNIDAZOLE (FLAGYL) IVPB 500 mg        500 mg 100 mL/hr over 60 Minutes Intravenous Every 12  hours 02/17/22 0713 02/18/22 1926   02/16/22 1600  ceFEPIme (MAXIPIME) 2 g in sodium chloride 0.9 % 100 mL IVPB        2 g 200 mL/hr over 30 Minutes Intravenous Every 12 hours 02/16/22 0818 02/18/22 1847   02/16/22 1500  metroNIDAZOLE (FLAGYL) IVPB 500 mg  Status:  Discontinued        500 mg 100 mL/hr over 60 Minutes Intravenous Every 12 hours 02/16/22 1404 02/17/22 0711   02/15/22 1800  vancomycin (VANCOREADY) IVPB 1250 mg/250 mL  Status:  Discontinued        1,250 mg 166.7 mL/hr over 90 Minutes Intravenous Every 24 hours 02/21/2022 1955 02/15/22 0737   02/15/22 1415  metroNIDAZOLE (FLAGYL) IVPB 500 mg  Status:  Discontinued        500 mg 100 mL/hr over 60 Minutes Intravenous Every 12 hours 02/15/22 1325 02/16/22 0840   02/15/22 0400  ceFEPIme (MAXIPIME) 2 g in sodium chloride 0.9 % 100 mL IVPB  Status:  Discontinued        2 g 200 mL/hr over 30 Minutes Intravenous Every 8 hours 02/28/2022 1955 02/16/22 0817   03/02/2022 2000  vancomycin (VANCOREADY) IVPB 1250 mg/250 mL  Status:  Discontinued        1,250 mg 166.7 mL/hr over 90 Minutes Intravenous Every 24 hours 03/09/2022 1952 02/13/2022 1955   03/04/2022 1945  ceFEPIme (MAXIPIME) 2 g in sodium chloride 0.9 % 100 mL IVPB        2 g 200 mL/hr over 30 Minutes Intravenous  Once 02/19/2022 1931 03/01/2022 2051   02/26/2022 1945  metroNIDAZOLE (FLAGYL) IVPB 500 mg        500 mg 100 mL/hr over 60 Minutes Intravenous  Once 02/19/2022 1931 02/13/2022 2053   03/07/2022 1945  vancomycin (VANCOCIN) IVPB 1000 mg/200 mL premix  Status:  Discontinued        1,000 mg 200 mL/hr over 60 Minutes Intravenous  Once 02/13/2022 1931 03/03/2022 2053      Culture/Microbiology    Component Value Date/Time   SDES PLEURAL 02/19/2022 1435   SPECREQUEST NONE 02/15/2022 1435   CULT  02/20/2022 1435    NO GROWTH 3 DAYS Performed at [facility], [place] [address], [place], [place] [address]    REPTSTATUS 02/27/2022 FINAL 02/20/2022 1435    Other culture-see note  Radiology  Studies: DG CHEST PORT 1 VIEW  Result Date: 03/03/2022 CLINICAL DATA:  Chest tube placement. EXAM: PORTABLE CHEST 1 VIEW COMPARISON:  March 03, 2022 (5:33 a.m.) FINDINGS: There is stable left-sided PICC line positioning. Since the prior study, there is been interval bilateral chest tube placement. The distal ends of both chest tubes are seen overlying the medial aspects of the bilateral lung bases, respectively. Persistent bibasilar airspace disease is seen, left greater than right. Bilateral moderate-sized pleural effusions are noted. This is increased in severity on the right. No pneumothorax is identified. The cardiac silhouette is unchanged in size and appearance. Moderate severity calcification of the aortic arch is seen. Radiopaque surgical clips are noted within the right upper quadrant. The visualized skeletal structures are unremarkable. IMPRESSION: 1. Interval bilateral chest tube placement, as described above, with persistent bibasilar  airspace disease, left greater than right. 2. Bilateral moderate-sized pleural effusions, increased in severity on the right. Electronically Signed   By: Virgina Norfolk M.D.   On: 03/03/2022 18:08   DG CHEST PORT 1 VIEW  Result Date: 03/03/2022 CLINICAL DATA:  Follow-up pleural effusion. EXAM: PORTABLE CHEST 1 VIEW COMPARISON:  Chest radiograph March 02, 2022 at 1145 hours FINDINGS: The heart size and mediastinal contours are largely obscured but appear unchanged. Aortic atherosclerosis. Similar size moderate left pleural effusion and left lung base atelectasis/infiltrate. Stable small right pleural effusion with interval progression of the right lung base opacities including a peripheral or nodular focus of opacification in the right lateral lung base. The visualized skeletal structures are unchanged. IMPRESSION: Similar moderate left pleural effusion with adjacent atelectasis/infiltrate. Stable small right pleural effusion with interval progression of the  opacities in the right lung base. Electronically Signed   By: Dahlia Bailiff M.D.   On: 03/03/2022 08:14   DG CHEST PORT 1 VIEW  Result Date: 03/02/2022 CLINICAL DATA:  Follow-up pleural effusion. EXAM: PORTABLE CHEST 1 VIEW COMPARISON:  Chest radiograph dated 03/02/2022. FINDINGS: Left-sided PICC in similar position. Similar appearance of moderate size left pleural effusion and left lung base atelectasis or infiltrate. Interval progression of right lung base opacities. No pneumothorax. Atherosclerotic calcification of the aorta. No acute osseous pathology. IMPRESSION: 1. Similar appearance of moderate size left pleural effusion and left lung base atelectasis or infiltrate. 2. Interval progression of right lung base opacities. Electronically Signed   By: Anner Crete M.D.   On: 03/02/2022 23:58     LOS: 18 days   Antonieta Pert, MD [facility]  03/04/2022, 7:17 AM

## 2022-03-04 NOTE — Progress Notes (Signed)
PHARMACY - TOTAL PARENTERAL NUTRITION CONSULT NOTE   Indication:  bilateral chylothorax with high chest tube output  Patient Measurements: Height: '5\' 5"'$  (165.1 cm) Weight: 51.2 kg (112 lb 14 oz) IBW/kg (Calculated) : 57 TPN AdjBW (KG): 64 Body mass index is 18.78 kg/m.  Assessment: 61 year old female with pertinent PMH of CLL, depression, anxiety, HLD, HTN presents to [facility] ED on 6/2 unresponsive.  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 Patient also has bilateral chylothorax with high output.  Pharmacy has been consulted for TPN.  Glucose / Insulin: new onset DM2, A1c 6.5  On mSSI q4h, Semglee 10 units daily - used 17 units of SSI past 24 hrs - CBGs (goal <150) - somewhat sporadic, ranging 70s to 220s - on prednisone 50 mg daily  Electrolytes: stable Renal: Scr < 1, BUN slightly elevated Hepatic: LFTs wnl - TG 92, stable - albumin low 2.2 Intake / Output: - 1.3 L - Chest tube dislodged 6/18 am, chest tubes left and right placed 6/19 - Chest tube output ~ 3200 mL in 24 hr  - PO intake 240 ml MIVF:  none  GI Imaging: 6/10 CT Chest Bilateral large pleural effusions and bulky axillary and lower cervical lymphadenopathy  GI Surgeries / Procedures: None this admission  Central access: PICC placed 6/15 TPN start date: 6/13  Nutritional Goals: Goal TPN rate is 100 mL/hr (provides 150 g of protein and 2357 kcals per day)  RD Assessment: Estimated Needs Total Energy Estimated Needs: 2300-2600 Total Protein Estimated Needs: 140-160 grams Total Fluid Estimated Needs: >2.5 L  Current Nutrition:  - Low fat diet, no more than 7 g fat per meal - Boost/resource breeze BID - TPN  Significant Events:  - 6/14: TPN turned off at ~10:30p for transport to [facility]. D10 started while off TPN - 6/16 am TPN off/on D10 for K 5.6, TPN resumed 1800  Plan:  - Continue TPN at 100 ml/hr - Patient remains on TPN as she is unable to meet her caloric needs with enteral nutrition at  this time. - Electrolytes in TPN: Na 50 mEq/L K  21 mEq/L Ca 5 mEq/L Mg 5 mEq/L Phos 10 mmol/L Cl:Ac 1:1 - Add standard MVI and trace elements to TPN - Continue Semglee 10 units daily - Continue moderate SSI q4h and adjust as needed - Monitor TPN labs on Mon/Thurs  Tawnya Crook, PharmD, BCPS Clinical Pharmacist 03/04/2022 1:08 PM

## 2022-03-04 NOTE — Progress Notes (Signed)
PCCM INTERVAL PROGRESS NOTE  Hypotensive after a large amount of chest tube drainage  Give LR bolus 554m and Albumin 5% 50 grams    PGeorgann Housekeeper AGACNP-BC [facility]  See Amion for personal pager PCCM on call pager [phone] 7pm. Please call Elink 7p-7a. [phone] 03/04/2022 3:58 PM

## 2022-03-04 NOTE — Progress Notes (Addendum)
NAME:  [name], MRN:  [id number], DOB:  02-Sep-1961, LOS: 60 ADMISSION DATE:  02/23/2022, CONSULTATION DATE:  6/2 REFERRING MD:  Dr. Alvino Chapel, CHIEF COMPLAINT:  Unresponsive; Acute resp failure   BRIEF  Patient is a 61 year old female with pertinent PMH of CLL, depression, anxiety, HLD, HTN presents to [facility] ED on 6/2 unresponsive.  On 6/2 patient called EMS for abdominal pain.  Patient initially was alert and talking but became unresponsive with AMS.  Patient was hypoxic with agonal breathing and was placed on NRB.  Patient was noted to be hypotensive.  Patient transported to [facility] ED.  Upon arrival to [facility] ED patient remained unresponsive with agonal respirations.  Patient intubated for airway protection and hypoxia.  BP initially 81/65.  Given IV fluids.  Started on levo.  Fever 100.9 F and WBC 104.4.  Started on cefepime, Flagyl, Vanco.  Cultures pending.  Glucose 198, BNP 534, troponin 109 then 112, LA 4.4 then 1.8, UA unremarkable, UDS negative.  CXR with large left pleural effusion and small right pleural effusion.  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  CT abdomen with ascending colitis constipation, small intestine wall thickening possibly related to gastroenteritis; small volume ascites; extensive abdominopelvic and inguinal adenopathy.   PCCM was consulted for bilateral pleural effusion  Pertinent  Medical History    has a past medical history of Anxiety, Chronic abdominal pain, Chronic lymphocytic leukemia (CLL), B-cell ([place]) (04/04/2016), CLL (chronic lymphocytic leukemia) ([place]), Depression with anxiety (07/07/2015), Family history of hyperlipidemia (12/14/2019), H. 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   has a past surgical history that includes Surgery of left middle finger on left hand (childhood ); Tubal ligation; Cholecystectomy; Colonoscopy (02/07/2011); Hammer toe surgery (Left); Axillary lymph node biopsy (Right, 03/28/2016); Hysteroscopy with D & C (N/A, 07/30/2016); Thoracentesis (Left, 02/27/2022); and Chest tube insertion (N/A, 03/14/2022).     Significant Hospital Events: Including procedures, antibiotic start and stop dates in addition to other pertinent events   6/2: Patient admitted to [facility] ED unresponsive; intubated; shock started on levo; transferred to [facility]. CT chest and abd: 1. Extensive abdominopelvic and inguinal adenopathy, most likely related to lymphoma/leukemia. Numerous small hypodense splenic lesions versus bolus phase phenomenon. Intrahepatic periportal edema which is probably either due to fluid overload or hepatic dysfunction. The portal vein is patent within normal caliber limits. Chronic calcific pancreatitis but no findings of acute pancreatitis or ductal dilatation.  Ascending colitis versus nondistention, remainder of the colon with constipation. Diffuse small intestinal wall thickening with gastric fold thickening, could relate to gastroenteritis or hepatic dysfunction versus reactive from ascites versus congestive or infiltrating disease. 6/3 right chest tube placed. Left thora 2000 ml removed - milky 6/4 extubated -exudative, cx negative, lymphocytic. 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 Off pressors 6/6 Transferred to [facility] 6/6 ultrasound-guided right axillary lymph node biopsy -Started on pulse dose steroids at 50 mg prednisone 6/7 chest tube output over a liter 6/8 left thoracentesis for 1400 cc of fluid 6/10 - chest tube removed 6/12 - s/p right chest tube placement with 3.1 L drained  6/3 - Transferred from [facility] to [facility] 6/13 for initiation of palliative radiotherapy 02/26/22 - No chest pain. Saturation 96% on 2 L.  Chest tube drained 190 cc  of milky fluid 02/27/22 - now at [facility] long hospital bed 1610. Per RN - fall earlier today . CXR stable. Doing well. Got SIM for XRT.  On room air. HAs Chest tube right side draining chylothorax. Has mod-large effusion on the left side -refused chest tube.  Remains on octreiotice 6/16 - on TPN.  On [place] O2 . > 1000 cc rt sided chest tube output.  pulse prednisone 50 mg daily started XRT 6/15, rituximab is being considered 6/17 -she is stable with the same amount of oxygen but right-sided chest tube is somewhat withdrawn and also cannot.  Nevertheless it still draining chylothorax.  She feels the same.  She is unsure if she wants chest tube reinserted on the right side if this chest tube was to follow-up.  Definitely does not want chest tube on the left side. 6/18 dislodged right chest tube completely.  6/19 PleurX deferred due to poor candidate outpatient. Bilateral chest tubes placed due to worsening respiratory distress.   Interim History / Subjective:  Feeling much better this morning. Breathing comfortably. No complaints.  No pain at tube insertion sites.   Had 3800cc out from chest tubes overnight.   Objective   Blood pressure 109/60, pulse 99, temperature 98.6 F (37 C), temperature source Axillary, resp. rate 20, height '5\' 5"'$  (1.651 m), weight 51.2 kg, last menstrual period 07/25/2016, SpO2 100 %.        Intake/Output Summary (Last 24 hours) at 03/04/2022 0729 Last data filed at 03/04/2022 0600 Gross per 24 hour  Intake 2398.09 ml  Output 5325 ml  Net -2926.91 ml    Filed Weights   02/26/22 2314 03/01/22 0251 03/04/22 0432  Weight: 50.6 kg 50.3 kg 51.2 kg   Physical Exam:  General:  Frail elderly appearing female in NAD Neuro: Alert, oriented, non-focal HEENT:  [place]/AR, PERRL, no JVD Cardiovascular:  RRR, no MRG Lungs:  Clear bilateral breath sounds. R base somewhat diminished.  Musculoskeletal:  No acute deformity or ROM limitation.  Skin:  Grossly  intact.    Significant labs reviewed: WBC 49.7, Hgb 8.6  Assessment & Plan:   Acute hypoxemic respiratory failure secondary to bilateral pleural effusions.  Bilateral chylothorax: given known CLL history now with bulky mediastinal lymphadenopathy. Left thoracentesis 6/8 with 1453m drained. Right chest tube 6/3 with heavy output. Removed 6/10. Replaced 6/12 with 3+ liters of output. She had been hesitant regarding chet tube on the L because having the right sided tube in was uncomfortable. There was plan for PleurX drain on the L, however, she declined rapidly on 6/19 and [place] no [place] home [place]. Bilateral chest tubes were placed instead.  - Supplemental O2 to keep sats > 92% - PRN BiPAP - Follow chest tubes. Flush per protocol. Almost 4L total out overnight.  - Will place both tubes to waterseal today and monitor output/BP - Will repeat cell counts and culture of both effusions for completeness.  - Rad onc following for palliative radiation to the mediastinum. Planning for a two week course. Dose skipped yesterday due to respiratory distress. Looking much better today.  - continue  TPN and octreotide 50 mcg 3 times daily  in attempt to slow chyle production  - Morphine and ativan for air hunger. Palliative care following.    CLL/SLL with bulky mediastinal adenopathy - primary issue: thoracentesis on 02/15/22 showed atypical lymphocytes suspicious for lymphoproliferative process. right axillary node was biopsies on 02/18/22 showing B cells consistent with her history of lymphoma and  CLL -Per oncology, on prednisone  Protein calorie malnutrition, severe - TPN - Diet  Chronic HFpEF due to uncontrolled hypertension - per [facility]     Best practice:  Per [facility]  SIGNATURE    Georgann Housekeeper, AGACNP-BC [place] for personal pager PCCM on call pager [phone] until 7pm. Please call Elink 7p-7a. [phone]  03/04/2022 7:29  AM

## 2022-03-05 ENCOUNTER — Ambulatory Visit
Admit: 2022-03-05 | Discharge: 2022-03-05 | Disposition: A | Discharge status: Home / Self Care | Payer: Medicaid Other | Attending: Radiation Oncology | Admitting: Radiation Oncology

## 2022-03-05 ENCOUNTER — Other Ambulatory Visit: Payer: Self-pay

## 2022-03-05 ENCOUNTER — Inpatient Hospital Stay (HOSPITAL_COMMUNITY): Payer: Medicaid Other

## 2022-03-05 DIAGNOSIS — Z4682 Encounter for fitting and adjustment of non-vascular catheter: Secondary | ICD-10-CM

## 2022-03-05 DIAGNOSIS — J940 Chylous effusion: Secondary | ICD-10-CM | POA: Diagnosis not present

## 2022-03-05 DIAGNOSIS — J9601 Acute respiratory failure with hypoxia: Secondary | ICD-10-CM | POA: Diagnosis not present

## 2022-03-05 LAB — RAD ONC ARIA SESSION SUMMARY
Course Elapsed Days: 6
Plan Fractions Treated to Date: 4
Plan Prescribed Dose Per Fraction: 2.5 Gy
Plan Total Fractions Prescribed: 10
Plan Total Prescribed Dose: 25 Gy
Reference Point Dosage Given to Date: 10 Gy
Reference Point Session Dosage Given: 2.5 Gy
Session Number: 4

## 2022-03-05 LAB — GLUCOSE, CAPILLARY
Glucose-Capillary: 124 mg/dL — ABNORMAL HIGH (ref 70–99)
Glucose-Capillary: 155 mg/dL — ABNORMAL HIGH (ref 70–99)
Glucose-Capillary: 169 mg/dL — ABNORMAL HIGH (ref 70–99)
Glucose-Capillary: 184 mg/dL — ABNORMAL HIGH (ref 70–99)
Glucose-Capillary: 166 mg/dL — ABNORMAL HIGH (ref 70–99)
Glucose-Capillary: 155 mg/dL — ABNORMAL HIGH (ref 70–99)

## 2022-03-05 LAB — BASIC METABOLIC PANEL
Anion gap: 3 — ABNORMAL LOW (ref 5–15)
BUN: 33 mg/dL — ABNORMAL HIGH (ref 8–23)
CO2: 30 mmol/L (ref 22–32)
Calcium: 8.4 mg/dL — ABNORMAL LOW (ref 8.9–10.3)
Chloride: 108 mmol/L (ref 98–111)
Creatinine, Ser: 0.41 mg/dL — ABNORMAL LOW (ref 0.44–1.00)
GFR, Estimated: >60 mL/min (ref >60)
Glucose, Bld: 137 mg/dL — ABNORMAL HIGH (ref 70–99)
Potassium: 4.3 mmol/L (ref 3.5–5.1)
Sodium: 141 mmol/L (ref 135–145)

## 2022-03-05 LAB — PHOSPHORUS: Phosphorus: 2.9 mg/dL (ref 2.5–4.6)

## 2022-03-05 LAB — CBC
HCT: 31.8 % — ABNORMAL LOW (ref 36.0–46.0)
Hemoglobin: 9.9 g/dL — ABNORMAL LOW (ref 12.0–15.0)
MCH: 29.8 pg (ref 26.0–34.0)
MCHC: 31.1 g/dL (ref 30.0–36.0)
MCV: 95.8 fL (ref 80.0–100.0)
Platelets: 201 10*3/uL (ref 150–400)
RBC: 3.32 MIL/uL — ABNORMAL LOW (ref 3.87–5.11)
RDW: 19 % — ABNORMAL HIGH (ref 11.5–15.5)
WBC: 51.3 10*3/uL (ref 4.0–10.5)
nRBC: 0 % (ref 0.0–0.2)

## 2022-03-05 LAB — MAGNESIUM: Magnesium: 2 mg/dL (ref 1.7–2.4)

## 2022-03-05 MED ORDER — TRAVASOL 10 % IV SOLN
INTRAVENOUS | Status: AC
  Filled 2022-03-05: qty 1500

## 2022-03-05 MED ORDER — BOOST / RESOURCE BREEZE PO LIQD CUSTOM
1.0000 | Freq: Three times a day (TID) | ORAL | Status: DC
  Administered 2022-03-05 – 2022-03-18 (×31): 1 via ORAL

## 2022-03-05 MED ORDER — LACTATED RINGERS IV BOLUS
500.0000 mL | Freq: Once | INTRAVENOUS | Status: AC
  Administered 2022-03-05: 500 mL via INTRAVENOUS

## 2022-03-05 MED ORDER — ALBUMIN HUMAN 25 % IV SOLN
50.0000 g | Freq: Once | INTRAVENOUS | Status: AC
Start: 2022-03-05 — End: 2022-03-05
  Administered 2022-03-05: 50 g via INTRAVENOUS
  Filled 2022-03-05: qty 200

## 2022-03-05 NOTE — Progress Notes (Signed)
NAME:  [name], MRN:  [id number], DOB:  09-30-1960, LOS: 22 ADMISSION DATE:  03/05/2022, CONSULTATION DATE:  6/2 REFERRING MD:  Dr. Alvino Chapel, CHIEF COMPLAINT:  Unresponsive; Acute resp failure   BRIEF  Patient is a 61 year old female with pertinent PMH of CLL, depression, anxiety, HLD, HTN presents to [facility] ED on 6/2 unresponsive.  On 6/2 patient called EMS for abdominal pain.  Patient initially was alert and talking but became unresponsive with AMS.  Patient was hypoxic with agonal breathing and was placed on NRB.  Patient was noted to be hypotensive.  Patient transported to [facility] ED.  Upon arrival to [facility] ED patient remained unresponsive with agonal respirations.  Patient intubated for airway protection and hypoxia.  BP initially 81/65.  Given IV fluids.  Started on levo.  Fever 100.9 F and WBC 104.4.  Started on cefepime, Flagyl, Vanco.  Cultures pending.  Glucose 198, BNP 534, troponin 109 then 112, LA 4.4 then 1.8, UA unremarkable, UDS negative.  CXR with large left pleural effusion and small right pleural effusion.  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  CT abdomen with ascending colitis constipation, small intestine wall thickening possibly related to gastroenteritis; small volume ascites; extensive abdominopelvic and inguinal adenopathy.   PCCM was consulted for bilateral pleural effusion  Pertinent  Medical History    has a past medical history of Anxiety, Chronic abdominal pain, Chronic lymphocytic leukemia (CLL), B-cell ([place]) (04/04/2016), CLL (chronic lymphocytic leukemia) ([place]), Depression with anxiety (07/07/2015), Family history of hyperlipidemia (12/14/2019), H. 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   has a past surgical history that includes Surgery of left middle finger on left hand (childhood ); Tubal ligation; Cholecystectomy; Colonoscopy (02/07/2011); Hammer toe surgery (Left); Axillary lymph node biopsy (Right, 03/28/2016); Hysteroscopy with D & C (N/A, 07/30/2016); Thoracentesis (Left, 03/09/2022); and Chest tube insertion (N/A, 02/26/2022).     Significant Hospital Events: Including procedures, antibiotic start and stop dates in addition to other pertinent events   6/2: Patient admitted to [facility] ED unresponsive; intubated; shock started on levo; transferred to [facility]. CT chest and abd: 1. Extensive abdominopelvic and inguinal adenopathy, most likely related to lymphoma/leukemia. Numerous small hypodense splenic lesions versus bolus phase phenomenon. Intrahepatic periportal edema which is probably either due to fluid overload or hepatic dysfunction. The portal vein is patent within normal caliber limits. Chronic calcific pancreatitis but no findings of acute pancreatitis or ductal dilatation.  Ascending colitis versus nondistention, remainder of the colon with constipation. Diffuse small intestinal wall thickening with gastric fold thickening, could relate to gastroenteritis or hepatic dysfunction versus reactive from ascites versus congestive or infiltrating disease. 6/3 right chest tube placed. Left thora 2000 ml removed - milky 6/4 extubated -exudative, cx negative, lymphocytic. 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 Off pressors 6/6 Transferred to [facility] 6/6 ultrasound-guided right axillary lymph node biopsy -Started on pulse dose steroids at 50 mg prednisone 6/7 chest tube output over a liter 6/8 left thoracentesis for 1400 cc of fluid 6/10 - chest tube removed 6/12 - s/p right chest tube placement with 3.1 L drained  6/3 - Transferred from [facility] to [facility] 6/13 for initiation of palliative radiotherapy 02/26/22 - No chest pain. Saturation 96% on 2 L.  Chest tube drained 190 cc  of milky fluid 02/27/22 - now at [place] long hospital bed 1610. Per RN - fall earlier today . CXR stable. Doing well. Got SIM for XRT.  On room air. HAs Chest tube right side draining chylothorax. Has mod-large effusion on the left side -refused chest tube.  Remains on octreiotice 6/16 - on TPN.  On [place] O2 . > 1000 cc rt sided chest tube output.  pulse prednisone 50 mg daily started XRT 6/15, rituximab is being considered 6/17 -she is stable with the same amount of oxygen but right-sided chest tube is somewhat withdrawn and also cannot.  Nevertheless it still draining chylothorax.  She feels the same.  She is unsure if she wants chest tube reinserted on the right side if this chest tube was to follow-up.  Definitely does not want chest tube on the left side. 6/18 dislodged right chest tube completely.  6/19 PleurX deferred due to poor candidate outpatient. Bilateral chest tubes placed due to worsening respiratory distress.   Interim History / Subjective:   Breathing comfortably. 2850 out from chest tubes last 24 hours.  Objective   Blood pressure 120/69, pulse 96, temperature 98.2 F (36.8 C), temperature source Oral, resp. rate 19, height '5\' 5"'$  (1.651 m), weight 47.3 kg, last menstrual period 07/25/2016, SpO2 97 %.        Intake/Output Summary (Last 24 hours) at 03/05/2022 8786 Last data filed at 03/05/2022 0753 Gross per 24 hour  Intake 4298.39 ml  Output 4875 ml  Net -576.61 ml    Filed Weights   03/04/22 0432 03/04/22 1228 03/05/22 0439  Weight: 51.2 kg 47.7 kg 47.3 kg   Physical Exam:  General:  Frail elderly appearing female in NAD Neuro: Alert, oriented, non-focal HEENT:  [place]/AR, PERRL, no JVD Cardiovascular:  RRR, no MRG Lungs:  Clear bilateral breath sounds. R base somewhat diminished.  Musculoskeletal:  No acute deformity or ROM limitation.  Skin:  Grossly intact.    Significant labs reviewed: WBC 49.7, Hgb 8.6  Assessment & Plan:   Acute  hypoxemic respiratory failure secondary to bilateral pleural effusions.  Bilateral chylothorax: given known CLL history now with bulky mediastinal lymphadenopathy. Left thoracentesis 6/8 with 1439m drained. Right chest tube 6/3 with heavy output. Removed 6/10. Replaced 6/12 with 3+ liters of output. She had been hesitant regarding chet tube on the L because having the right sided tube in was uncomfortable. There was plan for PleurX drain on the L, however, she declined rapidly on 6/19 and has no support [place] home ([place]. Bilateral chest tubes were placed instead.  - Supplemental O2 to keep sats > 92% - Chest tubes to gravity - Follow chest tubes. Flush per protocol.  - Bilateral pleural fluid cultures pending from 6/19 - Rad onc following for palliative radiation to the mediastinum.  - continue TPN and octreotide 50 mcg 3 times daily in attempt to slow chyle production  - Morphine and ativan for air hunger. Palliative care following.   CLL/SLL with bulky mediastinal adenopathy - primary issue: thoracentesis on 02/15/22 showed atypical lymphocytes suspicious for lymphoproliferative process. right axillary node was biopsies on 02/18/22 showing B cells consistent with her history of lymphoma and CLL -Per oncology, on prednisone  Protein calorie malnutrition, severe - TPN - Diet  [facility]    Best practice:  Per [place] AGACNP-BC [place] pager PCCM on  call pager [phone] until 7pm. Please call Elink 7p-7a. [phone]  03/05/2022 8:22 AM

## 2022-03-05 NOTE — Progress Notes (Signed)
PHARMACY - TOTAL PARENTERAL NUTRITION CONSULT NOTE   Indication:  bilateral chylothorax with high chest tube output  Patient Measurements: Height: '5\' 5"'$  (165.1 cm) Weight: 47.3 kg (104 lb 4.4 oz) IBW/kg (Calculated) : 57 TPN AdjBW (KG): 64 Body mass index is 17.35 kg/m.  Assessment: 61 year old female with pertinent PMH of CLL, depression, anxiety, HLD, HTN presents to [facility] ED on 6/2 unresponsive.  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 Patient also has bilateral chylothorax with high output.  Pharmacy has been consulted for TPN.  Glucose / Insulin: new onset DM2, A1c 6.5  On mSSI q4h, Semglee 10 units daily - used 16 units of SSI past 24 hrs - CBGs (goal <180) - range 78-217, but majority are at goal. Semglee dose increased on 6/20 - on prednisone 50 mg daily  Electrolytes: All WNL Renal: Scr < 1, BUN slightly elevated but stable Hepatic: LFTs WNL - TG 92, stable - albumin low 2.2 Intake / Output: UOP: 1675 mL, Chest tube output: 2850 mL - Chest tube dislodged 6/18 am, chest tubes left and right placed 6/19 - No PO intake charted MIVF:  none  GI Imaging: 6/10 CT Chest Bilateral large pleural effusions and bulky axillary and lower cervical lymphadenopathy  GI Surgeries / Procedures: None this admission  Central access: PICC placed 6/15 TPN start date: 6/13  Nutritional Goals: Goal TPN rate is 100 mL/hr (provides 150 g of protein and 2357 kcals per day)  RD Assessment: Estimated Needs Total Energy Estimated Needs: 2300-2600 Total Protein Estimated Needs: 140-160 grams Total Fluid Estimated Needs: >2.5 L  Current Nutrition:  - Low fat diet, no more than 7 g fat per meal - Boost/resource breeze BID (all charted) - TPN at goal rate  Significant Events:  - 6/14: TPN turned off at ~10:30p for transport to [facility]. D10 started while off TPN - 6/16 am TPN off/on D10 for K 5.6, TPN resumed 1800  Plan:  - Continue TPN at 100 ml/hr - Patient remains on TPN as  she is unable to meet her caloric needs with enteral nutrition at this time. - Electrolytes in TPN: No changes Na 50 mEq/L K  21 mEq/L Ca 5 mEq/L Mg 5 mEq/L Phos 10 mmol/L Cl:Ac 1:1 - Add standard MVI and trace elements to TPN - Continue Semglee 10 units daily - Continue moderate SSI q4h and adjust as needed - Monitor TPN labs on Mon/Thurs  Lenis Noon, PharmD, BCPS Clinical Pharmacist 03/05/2022 7:48 AM

## 2022-03-05 NOTE — Progress Notes (Signed)
PROGRESS NOTE [name]  IWO:[id number] DOB: 03-16-1961 DOA: 02/16/2022 PCP: Noreene Larsson, NP   Brief Narrative/Hospital Course: 60 yof  w/ CLL, depression, anxiety, HLD, HTN presented to [facility] ED on 6/2 unresponsive. On 6/2 patient called EMS for abdominal pain.  Patient initially was alert and talking but became unresponsive with AMS, hypoxic with agonal breathing and was placed on NRB, was noted to be hypotensive.In ED at [facility] remained unresponsive with agonal respirations and was intubated for airway protection and hypoxia.BP initially 81/65.  Given IV fluids.  Started on levo.  Fever 100.9 F and WBC 104.4.  Started on cefepime, Flagyl, Vanco.  Cultures pending.  Glucose 198, BNP 534, troponin 109 then 112, LA 4.4 then 1.8, UA unremarkable, UDS negative.  CXR with large left pleural effusion and small right pleural effusion.  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  CT abdomen with ascending colitis constipation, small intestine wall thickening possibly related to gastroenteritis; small volume ascites; extensive abdominopelvic and inguinal adenopathy.  PCCM consulted for ICU admission and transferred to [facility]. 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  Has been off pressors and subsequently transferred to [facility] service 6/6 Her encephalopathy was probably due to hypercarbia followed by oncology PCCM. 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  Chest tube continues to have a chylothorax managed by pulmonary  She has had recurrent chylothorax with ongoing chest tube and patient transferred to [facility] long 02/27/22 started radiation treatment to see whether this will help with lymphadenopathy from CLL. 6/15-slid up from the chair while trying to get up-PICC line got dislodged and reinserted no bruising- coccyx,l-spine xray neg. 6/18 am-chest tube got dislodged patient reluctant for reinsertion, subsequent implant PleurX catheter for 6/19.Patient with ongoing tachypnea having worsening respiratory disease, unable to lay flat for radiation therapy and had further discussion by PCCM underwent bilateral chest tube placement 6/19 as bedside, at the Pleurx may not be ideal as she will not be able to take care of this at home and will have difficult placement with Pleurx.  Subjective: She denies any pain or shortness of breath at this time.   Assessment and Plan: Principal Problem:   Acute respiratory failure with hypoxia (HCC) Active Problems:   Sepsis ([place])   CLL (chronic lymphocytic leukemia) (HCC)   Shock ([place])   Chronic diastolic heart failure ([place])   DNR (do not resuscitate)   Protein-calorie malnutrition, severe   Pressure injury of skin   Bilateral chylothorax   Small cell B-cell lymphoma (HCC)   Leukocytosis   DM type 2 (diabetes mellitus, type 2) ([place])  Acute respiratory failure with hypoxia,Initially on vent on admit > on [place] now,stable. Bilateral chylothorax, w/ persistent output: Respiratory status/respiratory failure due to bilateral chylothorax in the setting of CLL.  Had chest tube in place on rt- but 03/02/22 am-got dislodged patient reluctant for reinsertion, subsequent implant PleurX catheter for 6/19.Patient with ongoing tachypnea having worsening respiratory disease, unable to lay flat for radiation therapy and  had further discussion by PCCM underwent bilateral chest tube  placement 6/19 as bedside, at the Pleurx may not be ideal as she will not be able to take care of this at home and will have difficult placement with Pleurx.  Continues to have significant output from chest tubes.  Being managed by PCCM.  On octreotide in attempt to decrease chyle production. Dr. Carlis Abbott discussed case with Dr. Kipp Brood, CVTS who recommended IR consult for possible lymphoscintigraphy and coil embolization of the thoracic duct  Sepsis/Shock/Hypotension poa: completed 5 days of antibiotics, no clear source was identified.  Having hypotension after bilateral chest tube placement 6/20 a.m. and placed on midodrine. Hemodynamics currently stable  CLL/SLL Significant leukocytosis: Oncology following> currently on prednisone 50 mg daily and XRT started 6/15> unable to get radiation 6/19 and unable to lay flat> now with chest tubes in place hopefully will be able to continue radiation therapy.  Dr. Lorenso Courier and radiation oncology team following.  Continues to have significant leukocytosis, although improved since admission.  Monitor Recent Labs  Lab 02/27/22 0613 03/02/22 0841 03/03/22 0620 03/04/22 0447 03/05/22 0503  WBC 98.2* 84.9* 85.9* 49.7* 51.3*  Chronic diastolic heart failure: Compensated, got lasix iv x1 6/17,monitor closely.Net IO Since Admission: -11,088 mL [03/05/22 1435] . Filed Weights   03/04/22 0432 03/04/22 1228 03/05/22 0439  Weight: 51.2 kg 47.7 kg 47.3 kg    Hyperkalemia resolved.  Hypokalemia repleted.  T2DM, new onset A1c 6.5, w/ uncontrolled hyperglycemia- and episode of hypoglycemia while holding TPN.  Currently blood sugar stable continue Semglee 10 units, SSI, monitor. Recent Labs  Lab 03/04/22 1945 03/04/22 2329 03/05/22 0419 03/05/22 0722 03/05/22 1108  GLUCAP 151* 182* 124* 155* 169*   Protein-calorie malnutrition, severe: Augment nutritional status, cont TPN, continue supplement  Nutrition Problem: Severe Malnutrition Etiology: chronic illness  (CLL) Signs/Symptoms: severe muscle depletion, severe fat depletion Interventions: Boost Breeze  Deconditioning/debility: PT OT as tolerated but limited due to chest tubes.Pt slided to floor-unwitnessed from [place] few several days ago- xray coccyx, L-spine neg.   Goals of care  -currently DNR -palliative care following  DVT prophylaxis: enoxaparin (LOVENOX) injection 40 mg Start: 02/16/22 1515 Code Status:   Code Status: DNR Family Communication: plan of care discussed with patient/ spoke w/ son on phone 6/18, updated by [facility] 6/19 Patient status is: inpatient because of need for xrt and CLL management. Level of care: Stepdown   Dispo: The patient is from: Home, independent.            Anticipated disposition: Will need skilled nursing facility    Mobility Assessment (last 72 hours)     Mobility Assessment     Row Name 03/05/22 0810 03/04/22 0740 03/03/22 2000 03/03/22 0800 03/02/22 2000   Does patient have an order for bedrest or is patient medically unstable No - Continue assessment No - Continue assessment No - Continue assessment No - Continue assessment No - Continue assessment   What is the highest level of mobility based on the progressive mobility assessment? Level 2 (Chair[place]) - Balance while sitting on edge of bed and cannot stand Level 1 (Bedfast) - Unable to balance while sitting on edge of bed  due to Bilateral chest tubes Level 2 (Chairfast) - Balance while sitting on edge of bed and cannot stand Level 2 (Chairfast) - Balance while sitting on edge of bed and cannot stand Level 2 (Chairfast) - Balance while sitting on edge of bed and cannot stand   Is the above level different from  baseline mobility prior to current illness? -- -- Yes - Recommend PT order Yes - Recommend PT order --             Objective: Vitals last 24 hrs: Vitals:   03/05/22 0900 03/05/22 1100 03/05/22 1200 03/05/22 1202  BP: 106/68 (!) 95/58    Pulse: (!) 106 83    Resp: (!) 30 (!) 25    Temp:    98.5 F (36.9 C) 98.2 F (36.8 C)  TempSrc:   Oral Oral  SpO2: 97% 96%    Weight:      Height:       Weight change: -3.5 kg  Physical Examination: General exam: AA0X3, ill looking, frail, thin older than stated age, weak appearing. HEENT:Oral mucosa moist, Ear/Nose WNL grossly, dentition normal. Respiratory system: bilaterally diminished at best clear lung sounds on upper airways, chest tube placement bilateral chest, no use of accessory muscle Cardiovascular system: S1 & S2 +, No JVD,. Gastrointestinal system: Abdomen soft,NT,ND,BS+ Nervous System:Alert, awake, moving extremities and grossly nonfocal Extremities: LE ankle edema neg, distal peripheral pulses palpable.  Skin: No rashes,no icterus. MSK: Normal muscle bulk,tone, power  Lue picc+  Medications reviewed: Scheduled Meds:  chlorhexidine gluconate (MEDLINE KIT)  15 mL Mouth Rinse BID   Chlorhexidine Gluconate Cloth  6 each Topical Daily   docusate sodium  100 mg Oral BID   enoxaparin (LOVENOX) injection  40 mg Subcutaneous Q24H   feeding supplement  1 Container Oral TID BM   insulin aspart  0-15 Units Subcutaneous Q4H   insulin glargine-yfgn  10 Units Subcutaneous Daily   midodrine  5 mg Oral TID WC   octreotide  50 mcg Subcutaneous TID   polyethylene glycol  17 g Oral Daily   predniSONE  50 mg Oral Q breakfast   sodium chloride flush  10 mL Other Q8H   sodium chloride flush  10 mL Other Q8H   sodium chloride flush  10-40 mL Intracatheter Q12H   Continuous Infusions:  sodium chloride Stopped (03/03/22 1302)   TPN ADULT (ION) 100 mL/hr at 03/05/22 0659   TPN ADULT (ION)     Diet Order             Diet regular Room service appropriate? Yes; Fluid consistency: Thin  Diet effective now                    Nutrition Problem: Severe Malnutrition Etiology: chronic illness (CLL) Signs/Symptoms: severe muscle depletion, severe fat depletion Interventions: Boost Breeze   Intake/Output Summary (Last 24 hours)  at 03/05/2022 1435 Last data filed at 03/05/2022 1409 Gross per 24 hour  Intake 4028.17 ml  Output 3875 ml  Net 153.17 ml   Net IO Since Admission: -11,088 mL [03/05/22 1435]  Wt Readings from Last 3 Encounters:  03/05/22 47.3 kg  03/08/20 64.4 kg  02/07/20 68.5 kg     Unresulted Labs (From admission, onward)     Start     Ordered   03/05/22 1610  Basic metabolic panel  Daily,   R     Question:  Specimen collection method  Answer:  Lab=Lab collect   03/04/22 1051   03/04/22 1004  Pathologist smear review  Once,   R        03/04/22 1004   03/02/22 0500  CBC  Daily,   R     Question:  Specimen collection method  Answer:  Unit=Unit collect   03/01/22 0729   02/27/22 0500  Comprehensive  metabolic panel  (TPN Lab Panel)  Every Mon,Thu (0500),   R     Question:  Specimen collection method  Answer:  Unit=Unit collect   02/25/22 1228   02/27/22 0500  Magnesium  (TPN Lab Panel)  Every Mon,Thu (0500),   R     Question:  Specimen collection method  Answer:  Unit=Unit collect   02/25/22 1228   02/27/22 0500  Phosphorus  (TPN Lab Panel)  Every Mon,Thu (0500),   R     Question:  Specimen collection method  Answer:  Unit=Unit collect   02/25/22 1228   02/27/22 0500  Triglycerides  (TPN Lab Panel)  Every Mon,Thu (0500),   R     Question:  Specimen collection method  Answer:  Unit=Unit collect   02/25/22 1228   02/21/2022 1442  Acid Fast Culture with reflexed sensitivities  (AFB smear + Culture w reflexed sensitivities panel)  Once,   R       See Hyperspace for full Linked Orders Report.   02/24/2022 1441          Data Reviewed: I have personally reviewed following labs and imaging studies CBC: Recent Labs  Lab 02/27/22 0613 03/02/22 0841 03/03/22 0620 03/04/22 0447 03/05/22 0503  WBC 98.2* 84.9* 85.9* 49.7* 51.3*  NEUTROABS 7.1  --   --   --   --   HGB 12.3 11.2* 11.3* 8.6* 9.9*  HCT 39.6 36.0 36.0 27.1* 31.8*  MCV 91.7 92.5 93.8 94.4 95.8  PLT 457* 291 297 194 672   Basic  Metabolic Panel: Recent Labs  Lab 02/27/22 0613 02/28/22 0422 02/28/22 0727 02/28/22 2032 03/01/22 0355 03/02/22 0841 03/03/22 0620 03/05/22 0503  NA 137 135  --   --  136 139 140 141  K 4.7 5.6*   < > 4.4 3.8 3.1* 3.9 4.3  CL 103 100  --   --  103 102 104 108  CO2 30 29  --   --  $R'31 31 30 30  'vl$ GLUCOSE 93 187*  --   --  158* 156* 96 137*  BUN 28* 27*  --   --  27* 32* 31* 33*  CREATININE 0.49 0.43*  --   --  0.37* 0.37* 0.31* 0.41*  CALCIUM 8.1* 7.5*  --   --  7.5* 7.9* 8.4* 8.4*  MG 2.0 1.8  --   --  2.0  --  2.1 2.0  PHOS 1.9* 2.6  --   --  3.1  --  2.8 2.9   < > = values in this interval not displayed.   GFR: Estimated Creatinine Clearance: 55.1 mL/min (A) (by C-G formula based on SCr of 0.41 mg/dL (L)). Liver Function Tests: Recent Labs  Lab 02/27/22 0613 03/03/22 0620  AST 33 32  ALT 35 31  ALKPHOS 71 74  BILITOT 0.3 0.3  PROT 4.9* 5.1*  ALBUMIN 2.2* 2.2*   No results for input(s): "LIPASE", "AMYLASE" in the last 168 hours. No results for input(s): "AMMONIA" in the last 168 hours. Coagulation Profile: No results for input(s): "INR", "PROTIME" in the last 168 hours. BNP (last 3 results) No results for input(s): "PROBNP" in the last 8760 hours. HbA1C: No results for input(s): "HGBA1C" in the last 72 hours. CBG: Recent Labs  Lab 03/04/22 1945 03/04/22 2329 03/05/22 0419 03/05/22 0722 03/05/22 1108  GLUCAP 151* 182* 124* 155* 169*   Lipid Profile: Recent Labs    03/03/22 0500  TRIG 92   Thyroid Function Tests: No  results for input(s): "TSH", "T4TOTAL", "FREET4", "T3FREE", "THYROIDAB" in the last 72 hours. Sepsis Labs: No results for input(s): "PROCALCITON", "LATICACIDVEN" in the last 168 hours.  Recent Results (from the past 240 hour(s))  Body fluid culture w Gram Stain     Status: None (Preliminary result)   Collection Time: 03/04/22 10:00 AM   Specimen: Pleural Fluid  Result Value Ref Range Status   Specimen Description   Final    PLEURAL  RT Performed at [facility] [address], [place], [place] [address]    Special Requests   Final    NONE Performed at [facility], [place] [address] [place], [place] [address]    Gram Stain NO WBC SEEN NO ORGANISMS SEEN   Final   Culture   Final    NO GROWTH < 24 HOURS Performed at [facility], [place] [address] [place], [place] [address]    Report Status PENDING  Incomplete  Body fluid culture w Gram Stain     Status: None (Preliminary result)   Collection Time: 03/04/22 10:00 AM   Specimen: Pleural Fluid  Result Value Ref Range Status   Specimen Description   Final    PLEURAL LT Performed at [facility] [address], [place], [place] [address]    Special Requests   Final    NONE Performed at [facility], [place] [address] [place], [place] [address]    Gram Stain   Final    FEW WBC PRESENT,BOTH PMN AND MONONUCLEAR NO ORGANISMS SEEN    Culture   Final    NO GROWTH < 24 HOURS Performed at [facility], [place] [address], [place],  [address]    Report Status PENDING  Incomplete     Antimicrobials: Anti-infectives (From admission, onward)    Start     Dose/Rate Route Frequency Ordered Stop   02/17/22 1600  metroNIDAZOLE (FLAGYL) IVPB 500 mg        500 mg 100 mL/hr over 60 Minutes Intravenous Every 12 hours 02/17/22 0713 02/18/22 1926   02/16/22 1600  ceFEPIme (MAXIPIME) 2 g in sodium chloride 0.9 % 100 mL IVPB        2 g 200 mL/hr over 30 Minutes Intravenous Every 12 hours 02/16/22 0818 02/18/22 1847   02/16/22 1500  metroNIDAZOLE (FLAGYL) IVPB 500 mg  Status:  Discontinued        500 mg 100 mL/hr over 60 Minutes Intravenous Every 12 hours 02/16/22 1404 02/17/22 0711   02/15/22 1800  vancomycin (VANCOREADY) IVPB 1250 mg/250 mL  Status:  Discontinued        1,250 mg 166.7 mL/hr over 90 Minutes Intravenous Every 24 hours 03/06/2022 1955 02/15/22 0737   02/15/22 1415   metroNIDAZOLE (FLAGYL) IVPB 500 mg  Status:  Discontinued        500 mg 100 mL/hr over 60 Minutes Intravenous Every 12 hours 02/15/22 1325 02/16/22 0840   02/15/22 0400  ceFEPIme (MAXIPIME) 2 g in sodium chloride 0.9 % 100 mL IVPB  Status:  Discontinued        2 g 200 mL/hr over 30 Minutes Intravenous Every 8 hours 03/03/2022 1955 02/16/22 0817   03/11/2022 2000  vancomycin (VANCOREADY) IVPB 1250 mg/250 mL  Status:  Discontinued        1,250 mg 166.7 mL/hr over 90 Minutes Intravenous Every 24 hours 02/13/2022 1952 03/09/2022 1955   03/02/2022 1945  ceFEPIme (MAXIPIME) 2 g in sodium  chloride 0.9 % 100 mL IVPB        2 g 200 mL/hr over 30 Minutes Intravenous  Once 02/24/2022 1931 02/25/2022 2051   03/14/2022 1945  metroNIDAZOLE (FLAGYL) IVPB 500 mg        500 mg 100 mL/hr over 60 Minutes Intravenous  Once 02/25/2022 1931 03/05/2022 2053   02/24/2022 1945  vancomycin (VANCOCIN) IVPB 1000 mg/200 mL premix  Status:  Discontinued        1,000 mg 200 mL/hr over 60 Minutes Intravenous  Once 02/20/2022 1931 03/14/2022 2053      Culture/Microbiology    Component Value Date/Time   SDES  03/04/2022 1000    PLEURAL RT Performed at [facility], [place] [address] [place], [place] [address]    SDES  03/04/2022 1000    PLEURAL LT Performed at [facility], [place] [address] [place], [place] [address]    SPECREQUEST  03/04/2022 1000    NONE Performed at [facility], [place] [address] [place], [place] [address]    SPECREQUEST  03/04/2022 1000    NONE Performed at [facility], [place] [address] [place], [place] [address]    CULT  03/04/2022 1000    NO GROWTH < 24 HOURS Performed at [place] [address] [place], [place] [address]    CULT  03/04/2022 1000    NO GROWTH < 24 HOURS Performed at [facility], [place] [address], [place], [place] [address]    REPTSTATUS PENDING 03/04/2022 1000   REPTSTATUS PENDING 03/04/2022 1000     Other culture-see note  Radiology Studies: DG CHEST PORT 1 VIEW  Result Date: 03/05/2022 CLINICAL DATA:  Pleural effusion EXAM: PORTABLE CHEST 1 VIEW COMPARISON:  Chest x-ray dated March 04, 2022 FINDINGS: Cardiac and mediastinal contours are unchanged. Unchanged position of left arm PICC. Small right pleural effusion. Stable small right pleural effusion with new right apical pneumothorax. Bilateral chest tubes in place. Lower lung predominant heterogeneous opacities, likely due to atelectasis. IMPRESSION: 1. Stable small right pleural effusion with new right apical pneumothorax. 2. Small left pleural effusion is decreased in size when compared with prior exam, possible new left apical pneumothorax versus skin fold artifact. Recommend attention on follow-up. Electronically Signed   By: Yetta Glassman M.D.   On: 03/05/2022 08:25   DG CHEST PORT 1 VIEW  Result Date: 03/04/2022 CLINICAL DATA:  Provided history: Pleural effusion. EXAM: PORTABLE CHEST 1 VIEW COMPARISON:  Prior chest radiographs 03/03/2022 and earlier. FINDINGS: Unchanged position of a left-sided PICC with tip projecting at the level of the lower SVC. Redemonstrated bibasilar chest tubes. The cardiac silhouette is unchanged. Aortic atherosclerosis. Interval decrease in size of a right pleural effusion, now trace to small. Associated mild right basilar atelectasis, improved. Persistent moderate left pleural effusion with associated atelectasis and/or consolidation within the left mid to lower lung field. No evidence of pneumothorax. IMPRESSION: Left-sided PICC and bilateral chest tubes, unchanged in position. Interval decrease in size of a right pleural effusion, now trace to small. Associated mild right basilar atelectasis, improved. Persistent moderate left pleural effusion with associated atelectasis and/or consolidation within the left mid to lower lung field. Electronically Signed   By: Kellie Simmering D.O.   On: 03/04/2022 08:58   DG CHEST  PORT 1 VIEW  Result Date: 03/03/2022 CLINICAL DATA:  Chest tube placement. EXAM: PORTABLE CHEST 1 VIEW COMPARISON:  March 03, 2022 (5:33 a.m.) FINDINGS: There is stable left-sided  PICC line positioning. Since the prior study, there is been interval bilateral chest tube placement. The distal ends of both chest tubes are seen overlying the medial aspects of the bilateral lung bases, respectively. Persistent bibasilar airspace disease is seen, left greater than right. Bilateral moderate-sized pleural effusions are noted. This is increased in severity on the right. No pneumothorax is identified. The cardiac silhouette is unchanged in size and appearance. Moderate severity calcification of the aortic arch is seen. Radiopaque surgical clips are noted within the right upper quadrant. The visualized skeletal structures are unremarkable. IMPRESSION: 1. Interval bilateral chest tube placement, as described above, with persistent bibasilar airspace disease, left greater than right. 2. Bilateral moderate-sized pleural effusions, increased in severity on the right. Electronically Signed   By: Virgina Norfolk M.D.   On: 03/03/2022 18:08     LOS: 103 days   Kathie Dike, MD [facility]  03/05/2022, 2:35 PM

## 2022-03-05 NOTE — Consult Note (Signed)
Chief Complaint: Patient was seen in consultation today for  Chief Complaint  Patient presents with   unresponsive    Referring Physician(s): Dr. Carlis Abbott   Supervising Physician: Markus Daft  Patient Status: [facility] - In-pt  History of Present Illness: [name] is a 61 y.o. female with a medical history significant for anxiety/depression HTN and chronic lymphocytic leukemia. She presented to the [facility] ED 03/01/2022 unresponsive with hypotension, hypoxia and agonal breathing. She was intubated and given supportive care. Imaging obtained showed extensive abdominopelvic and inguinal adenopathy with ascites and bilateral pleural effusions. She was transferred to [facility] for further treatment.   A right chest tube was placed by the critical care team 02/15/22 and a left thoracentesis was performed with removal of 2 L of milky fluid. 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 She was seen in IR 02/18/22 for a lymph node biopsy and pathology was consistent with CLL.  Her hospitalization has been complicated by recurrent chylous effusions, several thoracentesis, chest tube placements (and an accidental removal) and transfer to [facility] for radiation therapy. IR had previously been requested for pleurx placement but this was deferred due to the patient being a poor outpatient candidate. The patient now has bilateral chest tubes with upwards of 10 L per day of output.   Interventional Radiology has been asked to evaluate this patient for a thoracic duct angiogram with possible thoracic duct embolization. Patient history and imaging reviewed by Dr. Anselm Pancoast.   Past Medical History:  Diagnosis Date   Anxiety    Chronic abdominal pain    resolved   Chronic lymphocytic leukemia (CLL), B-cell ([place]) 04/04/2016   CLL (chronic lymphocytic leukemia) ([place])    Depression with anxiety 07/07/2015   Family history of hyperlipidemia 12/14/2019   H. pylori infection 2/09   s/p prevpac   HAND PAIN,  BILATERAL 01/29/2009   Qualifier: Diagnosis of  By: Moshe Cipro MD, Margaret     Hypertension    Iron deficiency 11/17/2016   Lymphadenopathy 01/17/2016   Medical non-compliance 08/22/2017   Nicotine addiction    Psychosis ([place])    social worker states that patient does not like to talk about this and will stop her meds if you try and discuss this with her.   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  Otherwise normal colon without evidence of polyps, masses, inflammatory changes, diverticular AVMs   HAMMER TOE SURGERY Left    HYSTEROSCOPY WITH D & C N/A 07/30/2016   Procedure: DILATATION AND CURETTAGE /HYSTEROSCOPY;  Surgeon: Florian Buff, MD;  Location: [facility];  Service: Gynecology;  Laterality: N/A;   Surgery of left middle finger on left hand (childhood )     THORACENTESIS Left 03/02/2022   Procedure: THORACENTESIS;  Surgeon: Laurin Coder, MD;  Location: [place] [facility];  Service: Pulmonary;  Laterality: Left;  enlarging left effusion   TUBAL LIGATION      Allergies: Feraheme [ferumoxytol]  Medications: Prior to Admission medications   Medication Sig Start Date  End Date Taking? Authorizing Provider  lisinopril (ZESTRIL) 10 MG tablet TAKE 1 TABLET BY MOUTH ONCE A DAY. Patient not taking: Reported on 02/16/2022 03/20/20   Fayrene Helper, MD  Vitamin D, Ergocalciferol, (DRISDOL) 1.25 MG (50000 UNIT) CAPS capsule Take 1 capsule (50,000 Units total) by mouth every 7 (seven) days. Patient not taking: Reported on  02/16/2022 12/15/19   Perlie Mayo, NP  fluticasone [facility]) 50 MCG/ACT nasal spray Place 1 spray into the nose daily. 07/02/11 11/27/11  Fayrene Helper, MD     Family History  Problem Relation Age of Onset   Cancer Mother    Hypertension Daughter     Social History   Socioeconomic History   Marital status: Single    Spouse name: Not on file   Number of children: 2   Years of education: Not on file   Highest education level: Not on file  Occupational History   Occupation: disabled  Tobacco Use   Smoking status: Light Smoker    Packs/day: 0.00    Years: 16.00    Total pack years: 0.00    Types: Cigarettes   Smokeless tobacco: Never   Tobacco comments:    3-4 per day   Vaping Use   Vaping Use: Never used  Substance and Sexual Activity   Alcohol use: Not Currently    Alcohol/week: 0.0 standard drinks of alcohol    Comment: occ   Drug use: No   Sexual activity: Not Currently    Birth control/protection: Post-menopausal  Other Topics Concern   Not on file  Social History Narrative   Lives alone   Social Determinants of Health   Financial Resource Strain: Low Risk  (03/08/2020)   Overall Financial Resource Strain (CARDIA)    Difficulty of Paying Living Expenses: Not hard at all  Food Insecurity: Food Insecurity Present (03/08/2020)   Hunger Vital Sign    Worried About Running Out of Food in the Last Year: Sometimes true    Ran Out of Food in the Last Year: Never true  Transportation Needs: No Transportation Needs (03/08/2020)   PRAPARE - Hydrologist (Medical): No    Lack of Transportation (Non-Medical): No  Physical Activity: Insufficiently Active (03/08/2020)   Exercise Vital Sign    Days of Exercise per Week: 4 days    Minutes of Exercise per Session: 20 min  Stress: No Stress Concern Present (03/08/2020)   [place]    Feeling of Stress : Only a little  Social  Connections: Moderately Isolated (03/08/2020)   Social Connection and Isolation Panel [NHANES]    Frequency of Communication with Friends and Family: More than three times a week    Frequency of Social Gatherings with Friends and Family: Once a week    Attends Religious Services: More than 4 times per year    Active Member of [employer] or Organizations: No    Attends Archivist Meetings: Never    Marital Status: Never married    Review of Systems: A 12 point ROS discussed and pertinent positives are indicated in the HPI above.  All other systems are negative.   Vital Signs: BP 119/72 (BP Location: Right Arm)   Pulse 89   Temp 99 F (37.2 C)   Resp (!) 35   Ht '5\' 5"'$  (1.651 m)   Wt 104 lb 4.4 oz (47.3 kg)   LMP 07/25/2016   SpO2 94%  BMI 17.35 kg/m   Physical Exam Constitutional:      General: She is not in acute distress. Cardiovascular:     Rate and Rhythm: Normal rate and regular rhythm.  Pulmonary:     Effort: Pulmonary effort is normal.     Comments: Left and right chest tubes with chylous output.  Lymphadenopathy:     Lower Body: Right inguinal adenopathy present. Left inguinal adenopathy present.  Skin:    General: Skin is warm and dry.  Neurological:     Mental Status: She is alert and oriented to person, place, and time.    Imaging: DG CHEST PORT 1 VIEW  Result Date: 03/05/2022 CLINICAL DATA:  Pleural effusion EXAM: PORTABLE CHEST 1 VIEW COMPARISON:  Chest x-ray dated March 04, 2022 FINDINGS: Cardiac and mediastinal contours are unchanged. Unchanged position of left arm PICC. Small right pleural effusion. Stable small right pleural effusion with new right apical pneumothorax. Bilateral chest tubes in place. Lower lung predominant heterogeneous opacities, likely due to atelectasis. IMPRESSION: 1. Stable small right pleural effusion with new right apical pneumothorax. 2. Small left pleural effusion is decreased in size when compared with prior exam, possible new  left apical pneumothorax versus skin fold artifact. Recommend attention on follow-up. Electronically Signed   By: Yetta Glassman M.D.   On: 03/05/2022 08:25   DG CHEST PORT 1 VIEW  Result Date: 03/04/2022 CLINICAL DATA:  Provided history: Pleural effusion. EXAM: PORTABLE CHEST 1 VIEW COMPARISON:  Prior chest radiographs 03/03/2022 and earlier. FINDINGS: Unchanged position of a left-sided PICC with tip projecting at the level of the lower SVC. Redemonstrated bibasilar chest tubes. The cardiac silhouette is unchanged. Aortic atherosclerosis. Interval decrease in size of a right pleural effusion, now trace to small. Associated mild right basilar atelectasis, improved. Persistent moderate left pleural effusion with associated atelectasis and/or consolidation within the left mid to lower lung field. No evidence of pneumothorax. IMPRESSION: Left-sided PICC and bilateral chest tubes, unchanged in position. Interval decrease in size of a right pleural effusion, now trace to small. Associated mild right basilar atelectasis, improved. Persistent moderate left pleural effusion with associated atelectasis and/or consolidation within the left mid to lower lung field. Electronically Signed   By: Kellie Simmering D.O.   On: 03/04/2022 08:58   DG CHEST PORT 1 VIEW  Result Date: 03/03/2022 CLINICAL DATA:  Chest tube placement. EXAM: PORTABLE CHEST 1 VIEW COMPARISON:  March 03, 2022 (5:33 a.m.) FINDINGS: There is stable left-sided PICC line positioning. Since the prior study, there is been interval bilateral chest tube placement. The distal ends of both chest tubes are seen overlying the medial aspects of the bilateral lung bases, respectively. Persistent bibasilar airspace disease is seen, left greater than right. Bilateral moderate-sized pleural effusions are noted. This is increased in severity on the right. No pneumothorax is identified. The cardiac silhouette is unchanged in size and appearance. Moderate severity  calcification of the aortic arch is seen. Radiopaque surgical clips are noted within the right upper quadrant. The visualized skeletal structures are unremarkable. IMPRESSION: 1. Interval bilateral chest tube placement, as described above, with persistent bibasilar airspace disease, left greater than right. 2. Bilateral moderate-sized pleural effusions, increased in severity on the right. Electronically Signed   By: Virgina Norfolk M.D.   On: 03/03/2022 18:08   DG CHEST PORT 1 VIEW  Result Date: 03/03/2022 CLINICAL DATA:  Follow-up pleural effusion. EXAM: PORTABLE CHEST 1 VIEW COMPARISON:  Chest radiograph March 02, 2022 at 1145 hours FINDINGS: The heart size  and mediastinal contours are largely obscured but appear unchanged. Aortic atherosclerosis. Similar size moderate left pleural effusion and left lung base atelectasis/infiltrate. Stable small right pleural effusion with interval progression of the right lung base opacities including a peripheral or nodular focus of opacification in the right lateral lung base. The visualized skeletal structures are unchanged. IMPRESSION: Similar moderate left pleural effusion with adjacent atelectasis/infiltrate. Stable small right pleural effusion with interval progression of the opacities in the right lung base. Electronically Signed   By: Dahlia Bailiff M.D.   On: 03/03/2022 08:14   DG CHEST PORT 1 VIEW  Result Date: 03/02/2022 CLINICAL DATA:  Follow-up pleural effusion. EXAM: PORTABLE CHEST 1 VIEW COMPARISON:  Chest radiograph dated 03/02/2022. FINDINGS: Left-sided PICC in similar position. Similar appearance of moderate size left pleural effusion and left lung base atelectasis or infiltrate. Interval progression of right lung base opacities. No pneumothorax. Atherosclerotic calcification of the aorta. No acute osseous pathology. IMPRESSION: 1. Similar appearance of moderate size left pleural effusion and left lung base atelectasis or infiltrate. 2. Interval  progression of right lung base opacities. Electronically Signed   By: Anner Crete M.D.   On: 03/02/2022 23:58   DG CHEST PORT 1 VIEW  Result Date: 03/02/2022 CLINICAL DATA:  Right chest tube fell out today. Follow-up large left pleural effusion. Evaluate for pneumothorax. EXAM: PORTABLE CHEST 1 VIEW COMPARISON:  Portable chest yesterday at 4:52 p.m. FINDINGS: 6:54 a.m., 03/02/2022. Left PICC remains in place with tip at the superior cavoatrial junction. Right chest tube is no longer seen. There is no visible pneumothorax. A large left pleural effusion again obscures the lower 2/3 of the left chest and is unchanged. Small to moderate right pleural effusion and overlying hazy interstitial consolidation are also similar. The remaining right lung is clear. Heart size is stable , central vessels are slightly prominent without edema. Aortic atherosclerosis. Osteopenia and degenerative change both shoulders. IMPRESSION: Overall aeration is unchanged. A large left pleural effusion small to moderate right pleural effusion and overlying consolidation appear similar. No visible pneumothorax with right chest tube no longer seen. Electronically Signed   By: Telford Nab M.D.   On: 03/02/2022 07:34   DG Chest Port 1 View  Result Date: 03/01/2022 CLINICAL DATA:  Shortness of breath. EXAM: PORTABLE CHEST 1 VIEW COMPARISON:  02/28/2022 FINDINGS: No significant change in a large left pleural effusion. Increased patchy density in the adjacent left lung. Interval patchy density in the right lower lung zone. Slight increase in size of a small right pleural effusion. Left PICC tip in the region of the superior cavoatrial junction. A pill right basilar chest tube is again noted. No pneumothorax. Grossly normal sized heart. Partially calcified thoracic aorta. Mild right and minimal left shoulder degenerative changes. IMPRESSION: 1. Interval patchy atelectasis or pneumonia in the right lower lung zone. 2. Increased patchy  atelectasis or pneumonia in the aerated portion of the left lung inferiorly. 3. Stable large left pleural effusion and slight increase in size of a small right pleural effusion. Electronically Signed   By: Claudie Revering M.D.   On: 03/01/2022 17:10   DG Chest Port 1 View  Result Date: 02/28/2022 CLINICAL DATA:  Pleural effusion EXAM: PORTABLE CHEST 1 VIEW COMPARISON:  Previous studies including the examination done earlier today FINDINGS: There is large left pleural effusion obscuring left mid and left lower lung fields. There is minimal blunting of right lateral CP angle. Right chest tube is noted with its tip in the medial right  lower lung fields. Proximal portion of the right pigtail chest tube appears to be coiled possibly in the pleural space or chest wall. This finding has not changed. There is no pneumothorax. Visualized lung fields show no signs of pulmonary edema or focal consolidation. Possible subsegmental atelectasis is seen in the right lower lung fields. Evaluation of left mid and left lower lung fields for atelectasis/pneumonia is limited by the effusion. Tip of PICC line introduced through the left upper extremity is seen in the region of junction of superior vena cava and right atrium. IMPRESSION: Large left pleural effusion has not changed significantly. Small right pleural effusion. Pigtail right chest tube. Proximal portion of the chest tube is coiled, possibly in the pleural space or chest wall. There is no pneumothorax. Electronically Signed   By: Elmer Picker M.D.   On: 02/28/2022 19:54   DG Chest 1 View  Result Date: 02/28/2022 CLINICAL DATA:  Chest crackles.  Encounter for chest tube placement. EXAM: CHEST  1 VIEW COMPARISON:  AP chest 02/27/2022 FINDINGS: New left upper extremity PICC line tip overlies the superior vena cava/right atrial junction. Moderate-to-large left pleural effusion is unchanged. The right lung is well aerated.  No definite right pleural effusion. Right  lower thorax pigtail drainage catheter is at a similar height. No pneumothorax is seen. Right axillary surgical clips. IMPRESSION: One. No significant change in moderate-to-large left pleural effusion. No significant change in position of right basilar pigtail drainage catheter. No significant right pleural effusion. No right pneumothorax is seen. Electronically Signed   By: Yvonne Kendall M.D.   On: 02/28/2022 17:40   DG Lumbar Spine 2-3 Views  Result Date: 02/28/2022 CLINICAL DATA:  Multiple falls. Low back and tailbone pain. EXAM: LUMBAR SPINE - 2-3 VIEW COMPARISON:  Abdominopelvic CT 03/05/2022. FINDINGS: Five lumbar type vertebral bodies. The alignment is normal. No evidence of acute fracture or pars defect. There is multilevel spondylosis with disc space narrowing and endplate osteophyte formation at the lower 3 levels. Mild facet degenerative changes are present. There is aortic atherosclerosis. IMPRESSION: No evidence of acute lumbar spine fracture or malalignment. Mild spondylosis. Electronically Signed   By: Richardean Sale M.D.   On: 02/28/2022 15:22   DG Sacrum/Coccyx  Result Date: 02/28/2022 CLINICAL DATA:  Multiple falls.  Low back and tailbone pain. EXAM: SACRUM AND COCCYX - 2+ VIEW COMPARISON:  Pelvic CT 03/04/2022. FINDINGS: The bones appear mildly demineralized. No evidence of acute fracture or sacroiliac joint diastasis. The symphysis pubis appears normal. Scattered pelvic calcifications, similar to previous CT and likely phleboliths. IMPRESSION: No evidence of acute sacrococcygeal fracture. Electronically Signed   By: Richardean Sale M.D.   On: 02/28/2022 15:20   DG Chest 1 View  Result Date: 02/27/2022 CLINICAL DATA:  Chest crackles.  Chest tube placement. EXAM: CHEST  1 VIEW COMPARISON:  Earlier same day.  02/26/2022. FINDINGS: No visible pleural fluid on the right. No right pneumothorax. Mild atelectasis at the right lung base. Pleural catheter overlies the lower thoracic midline.  Large left pleural effusion is unchanged with collapse of the left lower lobe. Skin fold present on the left. Right arm PICC has been removed. IMPRESSION: Pleural catheter projected over the midline. No visible right effusion or right pneumothorax. Persistent large effusion on the left with left lower lobe collapse. Electronically Signed   By: Nelson Chimes M.D.   On: 02/27/2022 11:48   [place] EKG SITE RITE  Result Date: 02/27/2022 If Site Rite image not attached, placement could not  be confirmed due to current cardiac rhythm.  DG Chest Port 1 View  Result Date: 02/27/2022 CLINICAL DATA:  Respiratory failure.  Chest tube. EXAM: PORTABLE CHEST 1 VIEW COMPARISON:  Portable chest yesterday at 5:50 a.m. FINDINGS: 5:07 a.m., 02/27/2022.  Patient is moderately rotated to the left. Pigtail of the pleural catheter is now left of the midline at the level of T12. No pneumothorax is seen. There is only a small right pleural effusion remaining and it appears improved. Right PICC tip remains at the superior cavoatrial junction. A large left pleural effusion again obscures the lower 2/3 of the left chest. There is aortic atherosclerosis and tortuosity. The cardiac size is stable. Remaining lungs clear with COPD change. There are surgical clips in the right axilla. Osteopenia and degenerative change. IMPRESSION: 1. The pigtail of the pleural catheter is now left of the midline in the base of the chest, exact position unclear but there is only minimal right pleural fluid remaining today. 2. Large left pleural effusion obscuring the lower 2/3 of the left chest,, stable. 3. COPD, aortic atherosclerosis. Electronically Signed   By: Telford Nab M.D.   On: 02/27/2022 06:52   DG Chest Port 1 View  Result Date: 02/26/2022 CLINICAL DATA:  Pleural effusions. EXAM: PORTABLE CHEST 1 VIEW COMPARISON:  Chest x-ray 02/19/2022. FINDINGS: The right chest tube has changed position. It is now likely in the posterior inferior right pleural  space but difficult to be certain without a lateral film. Small residual right pleural effusion and right basilar atelectasis. No pneumothorax. Persistent large left pleural effusion with overlying atelectasis. New right-sided PICC line is noted.  The tip is in the right atrium. IMPRESSION: 1. The right chest tube has changed position and is now likely in the posterior inferior right pleural space but difficult to be certain without a lateral film. 2. Persistent large left pleural effusion with overlying atelectasis. 3. New right PICC line with tip in the right atrium. Electronically Signed   By: Marijo Sanes M.D.   On: 02/26/2022 07:58   [place] EKG SITE RITE  Result Date: 02/25/2022 If Site Rite image not attached, placement could not be confirmed due to current cardiac rhythm.  DG Chest Port 1 View  Result Date: 02/21/2022 CLINICAL DATA:  Chest tube placement EXAM: PORTABLE CHEST 1 VIEW COMPARISON:  02/22/2022 FINDINGS: A right pigtail pleural drainage catheter is in place. The pneumothorax component of the right-sided hydropneumothorax is no longer appreciated. Reduced right pleural fluid. There is continued blunting of the right lateral costophrenic angle likely reflecting some residual pleural effusion. There is also a large left pleural effusion encompassing well over half of the left hemithorax. Atherosclerotic calcification of the aortic arch. IMPRESSION: 1. Substantially reduced right pleural fluid, status post pleural pigtail drainage catheter placement. The pneumothorax component of the right hydropneumothorax is no longer seen. 2. Large left pleural effusion. 3.  Aortic Atherosclerosis (ICD10-I70.0). Electronically Signed   By: Van Clines M.D.   On: 02/28/2022 13:48   CT CHEST WO CONTRAST  Result Date: 02/22/2022 CLINICAL DATA:  Pleural effusion. Malignancy suspected. * Tracking Code: BO * EXAM: CT CHEST WITHOUT CONTRAST TECHNIQUE: Multidetector CT imaging of the chest was performed  following the standard protocol without IV contrast. RADIATION DOSE REDUCTION: This exam was performed according to the departmental dose-optimization program which includes automated exposure control, adjustment of the mA and/or kV according to patient size and/or use of iterative reconstruction technique. COMPARISON:  Chest CTA 02/21/2022 FINDINGS: Cardiovascular: The heart  size is normal. No substantial pericardial effusion. Coronary artery calcification is evident. Mild atherosclerotic calcification is noted in the wall of the thoracic aorta. Mediastinum/Nodes: Assessment in mediastinum limited by lack of intravenous contrast extensive pleural fluid. Possible 15 mm subcarinal short axis lymph node on 81/3. Imaging appearance suspicious for right hilar lymphadenopathy although again this is not well assessed given lack of intravenous contrast. Bulky axillary lymphadenopathy seen bilaterally. As before, there appears to be bulky adenopathy in the lower neck, poorly visualized given lack of intravenous contrast. Lungs/Pleura: Bilateral large pleural effusions noted with prominent sub pulmonic components. There is dependent collapse/consolidation in the lower lungs. Centrilobular emphsyema noted. Tiny right apical pneumothorax evident. Upper Abdomen: Not well seen. Musculoskeletal: No worrisome lytic or sclerotic osseous abnormality. IMPRESSION: 1. Tiny right apical pneumothorax. 2. Bilateral large pleural effusions with dependent collapse/consolidation in the lower lungs. 3. Bulky axillary and lower cervical lymphadenopathy, poorly visualized given lack of intravenous contrast. Probable mediastinal lymphadenopathy. 4. Aortic Atherosclerosis (ICD10-I70.0). These results will be called to the ordering clinician or representative by the Radiologist Assistant, and communication documented in the PACS or [employer]. Electronically Signed   By: Misty Stanley M.D.   On: 02/22/2022 16:34   DG CHEST PORT 1  VIEW  Result Date: 02/22/2022 CLINICAL DATA:  Pleural effusion. EXAM: PORTABLE CHEST 1 VIEW COMPARISON:  Chest radiograph 02/21/2022 FINDINGS: The cardiomediastinal silhouette is grossly unchanged, with the heart remaining partially obscured. Aortic atherosclerosis is noted. A small right apical pneumothorax is unchanged. A pleural catheter projects near the inferior aspect of the right lateral costophrenic sulcus, unchanged. Small right and moderate left pleural effusions appear mildly increased, although this may be partly due to differences in patient positioning. Mild diffuse interstitial opacities and bibasilar airspace opacities have not significantly changed. IMPRESSION: 1. Unchanged small right apical pneumothorax. 2. Mildly increased size of left larger than right pleural effusions. 3. Unchanged interstitial and airspace opacities which may reflect mild edema and atelectasis. Electronically Signed   By: Logan Bores M.D.   On: 02/22/2022 10:21   DG Chest Port 1 View  Result Date: 02/21/2022 CLINICAL DATA:  Pneumothorax EXAM: PORTABLE CHEST 1 VIEW COMPARISON:  02/27/2022 FINDINGS: Unchanged AP portable chest radiograph with a small, less than 10% right apical pneumothorax. Unchanged left greater than right layering bilateral pleural effusions and diffuse bilateral interstitial opacity. Heart and mediastinum are unremarkable. IMPRESSION: 1. Unchanged AP portable chest radiograph with a small, less than 10% right apical pneumothorax. 2. Unchanged left greater than right layering bilateral pleural effusions and diffuse bilateral interstitial opacity, likely edema. Electronically Signed   By: Delanna Ahmadi M.D.   On: 02/21/2022 09:17   DG Chest Port 1 View  Result Date: 03/01/2022 CLINICAL DATA:  Postprocedure. EXAM: PORTABLE CHEST 1 VIEW COMPARISON:  Chest x-ray 02/19/2022.  Chest CT 02/15/2022. FINDINGS: Right pleural drainage catheter is unchanged. There is a small left pleural effusion which has  significantly decreased from prior. There is a small right apical pneumothorax measuring 1 cm from the lung apex. No left-sided pneumothorax visualized. No mediastinal shift. The cardiomediastinal silhouette is within normal limits. The osseous structures are stable. IMPRESSION: 1. Small right apical pneumothorax. Right pleural drainage catheter in place. 2. Small left pleural effusion has decreased from prior. Electronically Signed   By: Ronney Asters M.D.   On: 02/23/2022 16:08   DG CHEST PORT 1 VIEW  Result Date: 02/19/2022 CLINICAL DATA:  Colles thorax.  Chest tube follow-up. EXAM: PORTABLE CHEST 1 VIEW COMPARISON:  02/17/2022 FINDINGS: Thoracostomy tube in the lower lateral right chest remains in place. No pleural air. No visible pleural fluid on the right. Increasing amount of pleural fluid on the left, with complete collapse of the left lower lobe and subtotal collapse of the left upper lobe. Central line has been removed. IMPRESSION: Persistent right pleural catheter. No right pleural air or visible pleural fluid. Enlarging volume of pleural fluid on the left, now with complete collapse of the left lower lobe and subtotal collapse of the left upper lobe. Electronically Signed   By: Nelson Chimes M.D.   On: 02/19/2022 07:25   [place] CORE BIOPSY (LYMPH NODES)  Result Date: 02/18/2022 INDICATION: 61 year old female with history CLL presenting multifocal bilateral axillary masses. EXAM: Ultrasound-guided axillary lymph node biopsy MEDICATIONS: None. ANESTHESIA/SEDATION: None. FLUOROSCOPY TIME:  None. COMPLICATIONS: None immediate. PROCEDURE: Informed written consent was obtained from the patient after a thorough discussion of the procedural risks, benefits and alternatives. All questions were addressed. Maximal Sterile Barrier Technique was utilized including caps, mask, sterile gowns, sterile gloves, sterile drape, hand hygiene and skin antiseptic. A timeout was performed prior to the initiation of the  procedure. Preprocedure ultrasound evaluation demonstrated a prominent, subcutaneous heterogeneously hypoechoic enlarged lymph node measuring approximately 3.3 x 1.9 cm in maximum longitudinal dimensions. The procedure was planned. The right axilla was prepped and draped in standard fashion. Subdermal Local anesthesia was administered with 1% lidocaine at the planned needle entry site. A small skin nick was made. Under direct ultrasound visualization, a 17 gauge introducer needle was directed to the periphery of the lymph node. Next, a total of 4, 18 gauge core biopsies were obtained. Two samples were placed in saline and 2 samples were placed in formalin. The needle was removed. Postprocedure ultrasound demonstrated no evidence of surrounding hematoma or other complicating features. Hemostasis was achieved with brief manual compression. A sterile bandage was applied. The patient tolerated the procedure well without complication. The patient was transferred back to the floor in stable condition. IMPRESSION: Technically successful ultrasound-guided right axillary lymph node biopsy. Ruthann Cancer, MD [facility] Electronically Signed   By: Ruthann Cancer M.D.   On: 02/18/2022 17:53   DG CHEST PORT 1 VIEW  Result Date: 02/17/2022 CLINICAL DATA:  Pleural effusion EXAM: PORTABLE CHEST 1 VIEW COMPARISON:  February 16, 2022 FINDINGS: The heart size and mediastinal contours are stable. Right central venous line is unchanged. Previously noted endotracheal tube and nasogastric tube have been removed. Patchy consolidation of the left mid and lung base with a left pleural effusion are noted. Stable patchy consolidation of right lung base is noted. The visualized skeletal structures are stable. IMPRESSION: New patchy consolidation of the left mid and lung base with a left pleural effusion, suspicious for pneumonia. Stable patchy consolidation of right lung base.  Electronically Signed   By: Abelardo Diesel M.D.   On: 02/17/2022 06:28   DG CHEST PORT 1 VIEW  Result Date: 02/16/2022 CLINICAL DATA:  Follow-up chest tube pleural effusion. EXAM: PORTABLE CHEST 1 VIEW COMPARISON:  Portable chest yesterday at 5:03 p.m. FINDINGS: 5:06 a.m. 02/16/2022. ETT tip 4.0 cm from the carina. Right IJ line tip again in the distal SVC. NGT is well inside the stomach but neither the side-hole or tip are filmed. Basolateral right chest pigtail tube thoracostomy is again noted. There was a minimally small adjacent basolateral pneumothorax on yesterday's film which is not seen today. There is increased hazy consolidation in the right base  which could be pneumonia or re-expansion edema. Faint patchy hazy opacities in the left lower lung field are less dense than yesterday with minimal left pleural fluid remaining. No significant right pleural fluid is seen. The mid and upper lungs mildly emphysematous and clear. The mediastinum is stable with aortic tortuosity and atherosclerosis. Osteopenia. IMPRESSION: 1. Chest tube in place with no visible pneumothorax. 2. Other support devices are unaltered. 3. Increased opacity in the right base which could be pneumonitis or re-expansion edema. 4. Improving opacities in the left lower lung field, minimal left pleural effusion. Electronically Signed   By: Telford Nab M.D.   On: 02/16/2022 07:13   DG CHEST PORT 1 VIEW  Result Date: 02/15/2022 CLINICAL DATA:  Chest tube placement. EXAM: PORTABLE CHEST 1 VIEW COMPARISON:  02/15/2021 and prior radiographs FINDINGS: A RIGHT thoracostomy tube is now noted with pigtail tip overlying the LOWER LATERAL RIGHT hemithorax. Near complete resolution of RIGHT pleural effusion noted with tiny RIGHT basilar pneumothorax. New airspace opacities versus atelectasis in the mid and LOWER LEFT lung noted. A trace LEFT pleural effusion is present. RIGHT IJ central venous catheter with tip overlying the mid SVC, endotracheal  tube with tip 3.5 cm above the carina and NG tube entering the stomach again noted. IMPRESSION: 1. RIGHT thoracostomy tube placement with near complete resolution of RIGHT pleural effusion. Tiny RIGHT basilar pneumothorax. 2. New airspace opacities/atelectasis in the mid and LOWER LEFT lung with trace LEFT pleural effusion. Electronically Signed   By: Margarette Canada M.D.   On: 02/15/2022 17:21   DG CHEST PORT 1 VIEW  Result Date: 02/15/2022 CLINICAL DATA:  Acute respiratory failure. Endotracheally intubated. Status post left thoracentesis. EXAM: PORTABLE CHEST 1 VIEW COMPARISON:  Prior today FINDINGS: Support lines and tubes remain in appropriate position. Small left pleural effusion has nearly completely resolved since previous study. No pneumothorax visualized. Moderate right pleural effusion shows no significant change. Heart size is stable. Diffuse interstitial infiltrates are seen, consistent with interstitial edema. IMPRESSION: Near complete resolution of left pleural effusion. No pneumothorax visualized. Stable moderate right pleural effusion, and diffuse interstitial edema. Electronically Signed   By: Marlaine Hind M.D.   On: 02/15/2022 14:03   ECHOCARDIOGRAM COMPLETE  Result Date: 02/15/2022    ECHOCARDIOGRAM REPORT   Patient Name:   [name] Burdett Date of Exam: 02/15/2022 Medical Rec #:  [id number]   Height:       65.0 in Accession #:    [id number]  Weight:       117.9 lb Date of Birth:  1960-10-29   BSA:          1.580 m Patient Age:    89 years    BP:           121/82 mmHg Patient Gender: F           HR:           82 bpm. Exam Location:  Inpatient Procedure: 2D Echo STAT ECHO Indications:    dyspnea  History:        Patient has no prior history of Echocardiogram examinations.                 Sepsis; Risk Factors:Hypertension and Current Smoker.  Sonographer:    Fallis Referring Phys: [id number] Hortencia Conradi MEIER IMPRESSIONS  1. Left ventricular ejection fraction, by estimation, is >75%. The left  ventricle has hyperdynamic function. The left ventricle has no regional wall motion abnormalities. Left ventricular diastolic parameters  are consistent with Grade II diastolic dysfunction (pseudonormalization). Elevated left atrial pressure.  2. Right ventricular systolic function is mildly reduced. The right ventricular size is normal. Tricuspid regurgitation signal is inadequate for assessing PA pressure.  3. A small pericardial effusion is present. Large pleural effusion in the left lateral region.  4. The mitral valve is normal in structure. No evidence of mitral valve regurgitation.  5. The aortic valve is tricuspid. There is mild thickening of the aortic valve. Aortic valve regurgitation is not visualized. Aortic valve sclerosis is present, with no evidence of aortic valve stenosis.  6. The inferior vena cava is dilated in size with <50% respiratory variability, suggesting right atrial pressure of 15 mmHg. FINDINGS  Left Ventricle: Left ventricular ejection fraction, by estimation, is >75%. The left ventricle has hyperdynamic function. The left ventricle has no regional wall motion abnormalities. The left ventricular internal cavity size was normal in size. There is no left ventricular hypertrophy. Left ventricular diastolic parameters are consistent with Grade II diastolic dysfunction (pseudonormalization). Elevated left atrial pressure. Right Ventricle: The right ventricular size is normal. No increase in right ventricular wall thickness. Right ventricular systolic function is mildly reduced. Tricuspid regurgitation signal is inadequate for assessing PA pressure. Left Atrium: Left atrial size was normal in size. Right Atrium: Right atrial size was normal in size. Pericardium: A small pericardial effusion is present. Mitral Valve: The mitral valve is normal in structure. No evidence of mitral valve regurgitation. Tricuspid Valve: The tricuspid valve is normal in structure. Tricuspid valve regurgitation is not  demonstrated. Aortic Valve: The aortic valve is tricuspid. There is mild thickening of the aortic valve. Aortic valve regurgitation is not visualized. Aortic valve sclerosis is present, with no evidence of aortic valve stenosis. Pulmonic Valve: The pulmonic valve was normal in structure. Pulmonic valve regurgitation is trivial. Aorta: The aortic root and ascending aorta are structurally normal, with no evidence of dilitation. Venous: The inferior vena cava is dilated in size with less than 50% respiratory variability, suggesting right atrial pressure of 15 mmHg. IAS/Shunts: No atrial level shunt detected by color flow Doppler. Additional Comments: There is a large pleural effusion in the left lateral region.  LEFT VENTRICLE PLAX 2D LVIDd:         3.20 cm   Diastology LVIDs:         1.80 cm   LV e' medial:    5.10 cm/s LV PW:         0.90 cm   LV E/e' medial:  19.2 LV IVS:        0.80 cm   LV e' lateral:   4.80 cm/s LVOT diam:     2.00 cm   LV E/e' lateral: 20.4 LV SV:         63 LV SV Index:   40 LVOT Area:     3.14 cm  RIGHT VENTRICLE            IVC RV S prime:     9.15 cm/s  IVC diam: 2.20 cm TAPSE (M-mode): 1.2 cm LEFT ATRIUM           Index        RIGHT ATRIUM           Index LA diam:      2.00 cm 1.27 cm/m   RA Area:     10.20 cm LA Vol (A4C): 18.7 ml 11.83 ml/m  RA Volume:   21.30 ml  13.48 ml/m  AORTIC VALVE LVOT  Vmax:   111.00 cm/s LVOT Vmean:  71.300 cm/s LVOT VTI:    0.200 m  AORTA Ao Root diam: 3.10 cm Ao Asc diam:  2.70 cm MITRAL VALVE MV Area (PHT): 2.80 cm     SHUNTS MV Decel Time: 271 msec     Systemic VTI:  0.20 m MV E velocity: 98.00 cm/s   Systemic Diam: 2.00 cm MV A velocity: 104.00 cm/s MV E/A ratio:  0.94 Mihai Croitoru MD Electronically signed by Sanda Klein MD Signature Date/Time: 02/15/2022/9:36:44 AM    Final    DG CHEST PORT 1 VIEW  Result Date: 02/15/2022 CLINICAL DATA:  61 year old female central line placement. Large pleural effusions. EXAM: PORTABLE CHEST 1 VIEW COMPARISON:   CTA chest yesterday. FINDINGS: Portable AP semi upright view at 0513 hours. Right IJ approach central line placed. Tip is just below the carina at the lower SVC level. Endotracheal tube tip in good position between the clavicles and carina. Enteric tube courses to the abdomen. No pneumothorax. Left greater than right veiling pleural effusions, visible mediastinal contours, and overall lung ventilation appears stable from yesterday. IMPRESSION: 1. Right IJ central line placed with no adverse features. 2. Otherwise satisfactory lines and tubes. 3. Stable ventilation with left greater than right pleural effusions. Electronically Signed   By: Genevie Ann M.D.   On: 02/15/2022 05:48   [place] EKG SITE RITE  Result Date: 02/15/2022 If Site Rite image not attached, placement could not be confirmed due to current cardiac rhythm.  CT ABDOMEN PELVIS W CONTRAST  Result Date: 02/18/2022 CLINICAL DATA:  Chronic lymphocytic leukemia, history lymphoma with increasing lower extremity swelling, weight loss. Called EMS initially for abdominal pain and became unresponsive with EMS. EXAM: CT ABDOMEN AND PELVIS WITH CONTRAST TECHNIQUE: Multidetector CT imaging of the abdomen and pelvis was performed using the standard protocol following bolus administration of intravenous contrast. RADIATION DOSE REDUCTION: This exam was performed according to the departmental dose-optimization program which includes automated exposure control, adjustment of the mA and/or kV according to patient size and/or use of iterative reconstruction technique. CONTRAST:  149m OMNIPAQUE IOHEXOL 350 MG/ML SOLN COMPARISON:  CT abdomen and pelvis with contrast 01/06/2020 and 06/25/2016, also CTA chest earlier today. FINDINGS: Lower chest: Large bilateral pleural effusions are again noted, on the left depressing the diaphragm and slightly shifting the mediastinum to the right with compressive collapse/consolidation of the lower lobes and portions of the upper and right  middle lobes. The cardiac size is normal. Multiple bulky masses in the low axillary regions on the left-greater-than-right are again shown. Largest visible mass on the left is 5.5 x 3.9 cm largest visible mass on the right is 3.9 x 2.3 cm. There are enlarged subcarinal and right hilar nodes partially visible mildly prominent left hilar nodes. There are multiple enlarged retrocrural lymph nodes largest of these is to the right measuring 2.5 x 1.2 cm (series 5 axial 26). Hepatobiliary: Status post cholecystectomy. No biliary dilatation is seen. There is intrahepatic periportal edema which is probably either due to fluid overload or hepatic dysfunction in this case given the interval marked weight loss. There is chronic 1.3 cm subcapsular hypodensity in the left lobe of the liver along side the gallbladder fossa unchanged and probably a small hemangioma or complex cyst. In the anterior segment of the right lobe of the liver on 5:44 there is a small flash filling hemangioma which was seen previously and unchanged. No hepatic mass enhancement is seen. The portal vein is patent within  normal caliber limits. Pancreas: there are scattered coarse chronic calcifications in the pancreas consistent with chronic calcific pancreatitis but no findings of acute pancreatitis, mass or ductal dilatation. Spleen:There is heterogeneous splenic enhancement. There could be multiple small hypodense lesions within the spleen or the heterogeneity could be due to bolus phase phenomenon but the spleen is not enlarged for size. Adrenals/Urinary Tract: No adrenal or renal cortical mass enhancement. There is a 1.5 cm cyst of the posterior lower right kidney additional scattered bilateral subcentimeter hypodensities which are too small to characterize. There is no urinary stone or obstruction. The bladder is catheterized contracted and not well seen. Stomach/Bowel: NGT is coiled in the stomach with the tip in the body of the stomach. There are  thickened folds in the stomach and small bowel no small bowel obstruction or gross inflammatory reaction. An appendix is not seen. There is moderate stool retention transverse and descending colon. Thickening of the ascending colon is noted versus underdistention. Vascular/Lymphatic: There is moderate to heavy aortoiliac calcification without aneurysm. The portal vein splenic vein and SMV are patent. There is extensive retroperitoneal adenopathy including in the periportal portacaval and retrocaval spaces with portacaval nodes up to 1.6 cm in short axis periportal nodes up to 1.8 cm in short axis and large confluent mantle of adenopathy extending along the retroperitoneum into in the kidneys lifting the aorta anteriorly encasing the aorta and IVC and continuing inferiorly along the pelvic sidewalls with additional bulky adenopathy in both sidewalls and both inguinal areas. The mantle of adenopathy extends from the level of the SMA inferior to the aortic bifurcation up to 15 cm in length and up to at least 10 cm coronal and 4.7 cm AP on 5:45. There is additional presacral coccygeal confluent adenopathy which is less well-defined but measures approximately 10 by 4 cm on 5:69. Bilateral bulky pelvic sidewall lymph nodes largest in the external chains are noted largest right external iliac chain node is 4.6 x 3.4 cm on 5:74 largest on the left 5.6 by 3.7 cm on 5:73 encasement of internal and external iliac vasculature without significant vascular narrowing. There is similar bulky bilateral inguinal chain adenopathy. Reproductive: The uterus is intact but not well seen. The ovaries are obscured by overlapping structures and adenopathy. Other: There is a small volume of abdominal and pelvic free ascites. No free air, hemorrhage or abscess is seen. There is diffuse mesenteric haziness and body wall anasarca. Interval weight loss consistent with cachexia. Musculoskeletal: There are degenerative changes in the lumbar spine  but no destructive osseous process. IMPRESSION: 1. Extensive abdominopelvic and inguinal adenopathy, most likely related to lymphoma/leukemia. 2. Numerous small hypodense splenic lesions versus bolus phase phenomenon. 3. Interval weight loss with very little body fat, appearance of cachexia, diffuse anasarca and small-volume abdominal and pelvic ascites. 4. Intrahepatic periportal edema which is probably either due to fluid overload or hepatic dysfunction. The portal vein is patent within normal caliber limits. 5. Chronic calcific pancreatitis but no findings of acute pancreatitis or ductal dilatation. 6. Ascending colitis versus nondistention, remainder of the colon with constipation. 7. Diffuse small intestinal wall thickening with gastric fold thickening, could relate to gastroenteritis or hepatic dysfunction versus reactive from ascites versus congestive or infiltrating disease. 8. Large low axillary masses, with subcarinal and hilar adenopathy. 9. Large pleural effusions. 10. Aortic atherosclerosis. Electronically Signed   By: Telford Nab M.D.   On: 03/13/2022 21:54   CT Head Wo Contrast  Result Date: 03/03/2022 CLINICAL DATA:  Mental status  changes, unknown cause. EXAM: CT HEAD WITHOUT CONTRAST TECHNIQUE: Contiguous axial images were obtained from the base of the skull through the vertex without intravenous contrast. RADIATION DOSE REDUCTION: This exam was performed according to the departmental dose-optimization program which includes automated exposure control, adjustment of the mA and/or kV according to patient size and/or use of iterative reconstruction technique. COMPARISON:  MRI brain 02/01/2004 report. FINDINGS: Brain: There is slight cerebral cortical atrophy with mild-to-moderate small-vessel disease of the cerebral white matter, with white matter changes only minimally progressed from 18 years ago. There is a stable 7.4 x 6.1 x 5.1 mm hyperdense lesion along the roof of third ventricle  consistent with a colloid cyst and unchanged. There is a mostly calcified extra-axial parasagittal high left frontal convexity meningioma measuring 1.8 x 1.5 x 1.3 cm, previously 1.5 x 0.9 cm, mildly deforming an underlying parasagittal superior left frontal gyrus but without underlying vasogenic edema. There are aneurysm coils with interval coiling of the previously noted left MCA bifurcation aneurysm. This causes moderate streak artifact through the adjacent temporal lobes and upper posterior fossa structures. Allowing for metallic artifacts there are no findings seen suspicious for acute cortical based infarct, hemorrhage, mass effect or midline shift. The cerebellum and brainstem are unremarkable. The ventricles are normal in size and position. Basal cisterns are clear. Vascular: There are scattered calcifications of the carotid siphons but no hyperdense central vasculature. Skull: No fracture or focal lesion. Sinuses/Orbits: There is patchy membrane thickening in the ethmoids. There is a 1 cm osteoma in the left frontal sinus. Other visualized sinuses are clear. No mastoid effusion. Other: None. IMPRESSION: 1. 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 Allowing for streak artifacts no acute intracranial process is suspected. 2. 1.8 x 1.5 x 1.3 cm mostly calcified meningioma in the parasagittal high left frontal convexity mildly deforming and underlying parasagittal superior frontal gyrus. This appears to have grown only minimally since 18 years ago. 3. Slight atrophy with mild-to-moderate small-vessel disease with only minimal progression of the small-vessel changes , no increased atrophy in the interval. 4. 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 5. Sinus membrane disease. Electronically Signed   By: Telford Nab M.D.   On: 02/15/2022 21:21   CT Angio Chest PE W and/or Wo Contrast  Result Date: 03/02/2022 CLINICAL DATA:  Unresponsive EXAM: CT  ANGIOGRAPHY CHEST WITH CONTRAST TECHNIQUE: Multidetector CT imaging of the chest was performed using the standard protocol during bolus administration of intravenous contrast. Multiplanar CT image reconstructions and MIPs were obtained to evaluate the vascular anatomy. RADIATION DOSE REDUCTION: This exam was performed according to the departmental dose-optimization program which includes automated exposure control, adjustment of the mA and/or kV according to patient size and/or use of iterative reconstruction technique. CONTRAST:  126m OMNIPAQUE IOHEXOL 350 MG/ML SOLN COMPARISON:  CT 01/06/2020, chest x-ray 02/13/2022, CT chest 02/14/2016 FINDINGS: Cardiovascular: Satisfactory opacification of the pulmonary arteries to the segmental level. No evidence of pulmonary embolism. Aorta is nonaneurysmal. Mild atherosclerosis. No dissection. Normal cardiac size. No significant pericardial effusion. Mediastinum/Nodes: Midline trachea. Endotracheal tube tip several cm above the carina. Esophageal tube tip below the diaphragm but incompletely visualized. Numerous enlarged supraclavicular and axillary nodes, poorly defined due to lack of subcutaneous fat. Negative for thyroid mass. Ill-defined soft tissue density in the mediastinum, may reflect combination of fluid and adenopathy. Lungs/Pleura: Large bilateral pleural effusions with probable passive atelectasis of the lungs. No visible pneumothorax Upper Abdomen: Findings are dictated  separately. Musculoskeletal: Sternum is intact. No definite acute osseous abnormality. Anasarca. Multiple large masses within the low axillary regions bilaterally, extending into the upper outer quadrant of the left breast. When compared to 2017, mostly fatty breast tissue was noted previously, today the breasts are diffusely infiltrated with fluid or soft tissue. This appears worse on the left side. Review of the MIP images confirms the above findings. IMPRESSION: 1. Negative for acute pulmonary  embolus. 2. Large bilateral pleural effusions with probable passive atelectasis in the lungs. 3. Lack of subcutaneous fat limits the exam. Extensive supraclavicular and lower neck adenopathy. Bilateral axillary adenopathy. Multiple large left greater than right axillary/chest wall masses, extending into the upper outer quadrants of the breasts, presumably representing adenopathy however the breasts today appear diffusely infiltrated with either edema or soft tissue/mass, recommend correlation with physical exam. Collectively the findings are suspect for lymphoma or metastatic disease. 4. Anasarca Aortic Atherosclerosis (ICD10-I70.0). Electronically Signed   By: Donavan Foil M.D.   On: 02/17/2022 21:11   DG Chest Portable 1 View  Result Date: 02/13/2022 CLINICAL DATA:  Altered mental status EXAM: PORTABLE CHEST 1 VIEW COMPARISON:  01/09/2015 FINDINGS: Tip of endotracheal tube is 4.8 cm above the carina. Distal portion of enteric tube is seen in the stomach. There is moderate to large left pleural effusion some of which appears to be loculated along the lateral margin of left upper and left mid lung fields. Evaluation of left lower lung fields for infiltrates is limited by the effusion. There is small right pleural effusion. Linear density in the right mid lung fields may suggest fluid in the interlobar fissure or subsegmental atelectasis. Central pulmonary vessels are more prominent. IMPRESSION: Central pulmonary vessels are more prominent suggesting CHF. Moderate to large left pleural effusion. Small right pleural effusion. Electronically Signed   By: Elmer Picker M.D.   On: 02/13/2022 18:55    Labs:  CBC: Recent Labs    03/02/22 0841 03/03/22 0620 03/04/22 0447 03/05/22 0503  WBC 84.9* 85.9* 49.7* 51.3*  HGB 11.2* 11.3* 8.6* 9.9*  HCT 36.0 36.0 27.1* 31.8*  PLT 291 297 194 201    COAGS: Recent Labs    02/16/22 0318  INR 1.3*  APTT 32    BMP: Recent Labs    03/01/22 0355  03/02/22 0841 03/03/22 0620 03/05/22 0503  NA 136 139 140 141  K 3.8 3.1* 3.9 4.3  CL 103 102 104 108  CO2 '31 31 30 30  '$ GLUCOSE 158* 156* 96 137*  BUN 27* 32* 31* 33*  CALCIUM 7.5* 7.9* 8.4* 8.4*  CREATININE 0.37* 0.37* 0.31* 0.41*  GFRNONAA >60 >60 >60 >60    LIVER FUNCTION TESTS: Recent Labs    02/21/22 0710 02/26/22 0341 02/27/22 0613 03/03/22 0620  BILITOT 0.1* 0.3 0.3 0.3  AST 27 36 33 32  ALT 20 39 35 31  ALKPHOS 77 69 71 74  PROT 4.5* 4.6* 4.9* 5.1*  ALBUMIN 1.8* 2.0* 2.2* 2.2*    TUMOR MARKERS: No results for input(s): "AFPTM", "CEA", "CA199", "CHROMGRNA" in the last 8760 hours.  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 She was resting comfortably and not in apparent discomfort or distress. She was alert, oriented and able to answer our questions appropriately. We introduced ourselves and discussed the nature of our visit. We discussed that IR had been asked to evaluate her for possible treatment options to decrease the  chylous effusions including a possible thoracic duct angiogram with thoracic duct embolization. We discussed the challenging nature of this procedure and the low likelihood of meaningful benefit in the setting of malignancy.   We notified the patient that IR would consult with other members of the healthcare team to formulate the best possible treatment plan for her. IR discussed this case and possible thoracic duct embolization with the Critical Care team. They are aware this procedure is rarely performed and IR is unsure of the utility of attempting this procedure in the setting of malignancy and massive lymphadenopathy. IR will continue to work with the patient's healthcare team but at this time no IR procedure is planned. IR will continue to follow along.   Thank you for this interesting consult.  I greatly enjoyed meeting LAILA MYHRE and look forward to  participating in their care.  A copy of this report was sent to the requesting provider on this date.  Electronically Signed: Soyla Dryer, AGACNP-BC [phone] 03/05/2022, 4:57 PM   I spent a total of 20 Minutes    in face to face in clinical consultation, greater than 50% of which was counseling/coordinating care for thoracic duct embolization.

## 2022-03-05 NOTE — Progress Notes (Signed)
Nutrition Follow-up  DOCUMENTATION CODES:   Severe malnutrition in context of chronic illness, Underweight  INTERVENTION:  - continue TPN per Pharmacist.  - continue Boost Breeze TID.  - continue to encourage PO intakes of meals on Regular diet with 7 grams fat/meal restriction.   NUTRITION DIAGNOSIS:   Severe Malnutrition related to chronic illness (CLL) as evidenced by severe muscle depletion, severe fat depletion. -ongoing  GOAL:   Patient will meet greater than or equal to 90% of their needs -met with PO intake and TPN regimen  MONITOR:   PO intake, Supplement acceptance, Labs, Weight trends, I & O's, Other (Comment) (TPN regimen)   ASSESSMENT:   61 yo female admitted with AMS and unresponsiveness. PMH includes CLL, depression, anxiety, HLD, HTN, lymphadenopathy, iron deficiency.  Significant Events: 6/02 - admitted unresponsive, intubated 6/03 - s/p right chest tube placement, s/p L thoracentesis with 2 L removed 6/04 - extubated 6/08 - s/p thoracentesis with 1.4 L fluid removed 6/10 - chest tube removed 6/12 - s/p right chest tube placement with 3.1 L drained 6/13 - transferred from [facility] to [facility] for initiation of palliative XRT 6/14 - diet advanced from NPO to Regular with 7 grams/meal restriction in place 6/15- XRT simulation + first session XRT 6/18- R-sided chest tube dislodged 6/19- bilateral chest tubes placed d/t worsening respiratory distress   Patient laying in bed with no visitors present at the time of RD visit. She shares that she has slept very well the past 2 nights and is feeling better today than she has at any point during hospitalization. She denies abdominal pain or overt chest-area or thoracic pain.   She has been drinking Boost Breeze (ordered TID, 0 grams fat/bottle) 2-3 times/day.   She reports that she had a banana, cheerios, milk, and coffee for breakfast this AM.  Recent meal intakes: 6/18- 50% of breakfast (169 kcal, 7.5 grams protein,  3.1 grams fat) and 100% of lunch (512 kcal, 19 grams protein, and 7 grams fat)  6/19- 25% of breakfast (64 kcal, 1.5 grams protein, and 0.4 grams fat) and 50% of lunch (154 kcal, 7 grams protein, 2.8 grams fat)  6/21- 100% of breakfast (330 kcal, 11 grams protein, 4.8 grams fat)   She is receiving custom TPN at goal rate of 100 ml/hr which is providing 2357 kcal, 150 grams protein, 57 grams fat).  Weight has been fluctuating throughout hospitalization. Non-pitting edema to BLE documented in the edema section of flow sheet.   She is aware of fat/meal restriction and shares that [employer] has been helpful when she orders meals.  She denies any nutrition-related questions, concerns, or needs at this time.    Labs reviewed; CBGs: 124 and 155 mg/dl, BUN: 33 mg/dl, creatinine: 9.20 mg/dl, Ca: 8.4 mg/dl.  Medications reviewed; 100 mg colace BID, sliding scale novolog, 10 units semglee/day, 17 g miralax/day, 50 mg deltasone/day.   Diet Order:   Diet Order             Diet regular Room service appropriate? Yes; Fluid consistency: Thin  Diet effective now                   EDUCATION NEEDS:   Education needs have been addressed  Skin:  Skin Assessment: Skin Integrity Issues: Skin Integrity Issues:: Stage I Stage I: sacrum  Last BM:  6/18 (type 5, large amount)  Height:   Ht Readings from Last 1 Encounters:  02/13/2022 5\' 5"  (1.651 m)    Weight:  Wt Readings from Last 1 Encounters:  03/05/22 47.3 kg     BMI:  Body mass index is 17.35 kg/m.  Estimated Nutritional Needs:  Kcal:  9753-0051 Protein:  140-160 grams Fluid:  >2.5 L      Jarome Matin, MS, RD, LDN, CNSC Registered Dietitian II Inpatient Clinical Nutrition RD pager # and on-call/weekend pager # available in [facility]

## 2022-03-05 NOTE — Progress Notes (Addendum)
Patient [name]      DOB: 1960/12/12      YWV:[id number]      Palliative Medicine Team    Subjective: Bedside symptom check completed. No family or visitors present at time of visit.   Physical exam: Patient sitting up in bed at time of visit. Breathing even and non-labored, no excessive secretions noted. Patient on room air with respiratory rate hovering around 24. Patient without physical or non-verbal signs of pain or discomfort at this time.    Assessment and plan: Patient endorses that her breathing feels much more relaxed after the chest tubes are placed. She reports minimal pain unless she is getting out of bed and walking. She states that the ordered pain medications are covering her pain well. She reports her appetite is about 50%, she is still receiving TPN support. She shares with this RN that she has a radiation treatment today at 14:30, she does not have any concerns relating to this. Bedside RN Maudie Mercury without any needs or concerns at this time. Will continue to follow for any changes or advances.    Thank you for allowing the Palliative Medicine Team to assist in the care of this patient.     Damian Leavell, MSN, RN Palliative Medicine Team Team Phone: [phone]  This phone is monitored 7a-7p, please reach out to attending physician outside of these hours for urgent needs.

## 2022-03-06 ENCOUNTER — Other Ambulatory Visit: Payer: Self-pay

## 2022-03-06 ENCOUNTER — Inpatient Hospital Stay (HOSPITAL_COMMUNITY): Payer: Medicaid Other

## 2022-03-06 ENCOUNTER — Ambulatory Visit
Admit: 2022-03-06 | Discharge: 2022-03-06 | Disposition: A | Discharge status: Home / Self Care | Payer: Medicaid Other | Attending: Radiation Oncology | Admitting: Radiation Oncology

## 2022-03-06 DIAGNOSIS — J940 Chylous effusion: Secondary | ICD-10-CM | POA: Diagnosis not present

## 2022-03-06 DIAGNOSIS — J9601 Acute respiratory failure with hypoxia: Secondary | ICD-10-CM | POA: Diagnosis not present

## 2022-03-06 DIAGNOSIS — Z4682 Encounter for fitting and adjustment of non-vascular catheter: Secondary | ICD-10-CM | POA: Diagnosis not present

## 2022-03-06 LAB — GLUCOSE, CAPILLARY
Glucose-Capillary: 113 mg/dL — ABNORMAL HIGH (ref 70–99)
Glucose-Capillary: 154 mg/dL — ABNORMAL HIGH (ref 70–99)
Glucose-Capillary: 172 mg/dL — ABNORMAL HIGH (ref 70–99)
Glucose-Capillary: 192 mg/dL — ABNORMAL HIGH (ref 70–99)
Glucose-Capillary: 212 mg/dL — ABNORMAL HIGH (ref 70–99)
Glucose-Capillary: 224 mg/dL — ABNORMAL HIGH (ref 70–99)
Glucose-Capillary: 92 mg/dL (ref 70–99)

## 2022-03-06 LAB — RAD ONC ARIA SESSION SUMMARY
Course Elapsed Days: 7
Plan Fractions Treated to Date: 5
Plan Prescribed Dose Per Fraction: 2.5 Gy
Plan Total Fractions Prescribed: 10
Plan Total Prescribed Dose: 25 Gy
Reference Point Dosage Given to Date: 12.5 Gy
Reference Point Session Dosage Given: 2.5 Gy
Session Number: 5

## 2022-03-06 LAB — COMPREHENSIVE METABOLIC PANEL
ALT: 45 U/L — ABNORMAL HIGH (ref 0–44)
AST: 33 U/L (ref 15–41)
Albumin: 2.8 g/dL — ABNORMAL LOW (ref 3.5–5.0)
Alkaline Phosphatase: 57 U/L (ref 38–126)
Anion gap: 5 (ref 5–15)
BUN: 34 mg/dL — ABNORMAL HIGH (ref 8–23)
CO2: 25 mmol/L (ref 22–32)
Calcium: 7.8 mg/dL — ABNORMAL LOW (ref 8.9–10.3)
Chloride: 111 mmol/L (ref 98–111)
Creatinine, Ser: 0.42 mg/dL — ABNORMAL LOW (ref 0.44–1.00)
GFR, Estimated: >60 mL/min (ref >60)
Glucose, Bld: 195 mg/dL — ABNORMAL HIGH (ref 70–99)
Potassium: 3.4 mmol/L — ABNORMAL LOW (ref 3.5–5.1)
Sodium: 141 mmol/L (ref 135–145)
Total Bilirubin: 0.6 mg/dL (ref 0.3–1.2)
Total Protein: 4.4 g/dL — ABNORMAL LOW (ref 6.5–8.1)

## 2022-03-06 LAB — SURGICAL PATHOLOGY

## 2022-03-06 LAB — CBC
HCT: 33 % — ABNORMAL LOW (ref 36.0–46.0)
Hemoglobin: 10.4 g/dL — ABNORMAL LOW (ref 12.0–15.0)
MCH: 30.3 pg (ref 26.0–34.0)
MCHC: 31.5 g/dL (ref 30.0–36.0)
MCV: 96.2 fL (ref 80.0–100.0)
Platelets: 212 10*3/uL (ref 150–400)
RBC: 3.43 MIL/uL — ABNORMAL LOW (ref 3.87–5.11)
RDW: 19.9 % — ABNORMAL HIGH (ref 11.5–15.5)
WBC: 60.3 10*3/uL (ref 4.0–10.5)
nRBC: 0 % (ref 0.0–0.2)

## 2022-03-06 LAB — PHOSPHORUS: Phosphorus: 2.9 mg/dL (ref 2.5–4.6)

## 2022-03-06 LAB — TRIGLYCERIDES
Triglycerides: UNDETERMINED mg/dL (ref <150)
Triglycerides: 48 mg/dL (ref <150)

## 2022-03-06 LAB — MAGNESIUM: Magnesium: 1.7 mg/dL (ref 1.7–2.4)

## 2022-03-06 MED ORDER — MAGNESIUM SULFATE 2 GM/50ML IV SOLN
2.0000 g | Freq: Once | INTRAVENOUS | Status: AC
  Administered 2022-03-06: 2 g via INTRAVENOUS
  Filled 2022-03-06: qty 50

## 2022-03-06 MED ORDER — SODIUM CHLORIDE 0.9 % IV SOLN: INTRAVENOUS | Status: DC | PRN

## 2022-03-06 MED ORDER — POTASSIUM CHLORIDE CRYS ER 20 MEQ PO TBCR
40.0000 meq | EXTENDED_RELEASE_TABLET | Freq: Once | ORAL | Status: AC
Start: 2022-03-06 — End: 2022-03-06
  Administered 2022-03-06: 40 meq via ORAL
  Filled 2022-03-06: qty 2

## 2022-03-06 MED ORDER — CHLORHEXIDINE GLUCONATE 0.12 % MT SOLN
OROMUCOSAL | Status: AC
  Administered 2022-03-06: 15 mL via OROMUCOSAL
  Filled 2022-03-06: qty 15

## 2022-03-06 MED ORDER — TRAVASOL 10 % IV SOLN
INTRAVENOUS | Status: AC
  Filled 2022-03-06: qty 1500

## 2022-03-06 MED ORDER — TRAVASOL 10 % IV SOLN
INTRAVENOUS | Status: DC
  Filled 2022-03-06: qty 1500

## 2022-03-06 NOTE — Evaluation (Signed)
Occupational Therapy Evaluation Patient Details Name: [name] MRN: [id number] DOB: 1961-02-26 Today's Date: 03/06/2022   History of Present Illness Pt is a 61 y/o female presenting on 6/2 after calling EMS of abdominal pain and becoming unresponsive with AMS. Found hypotensive and hypoxic, intubated 6/2-6/4. CLL/SLL with bulky mediastinal adenopathy.  CXR with large L and small R pleural effusion. 6/3 R chest tube placed, removed 6/10. 6/12 R chest tube placement.  Transferred from [facility] to [facility] on 6/13 for initation of palliative radiotherapy. 6/15 fell with imaging negative, mod-large effusion on L. 6/18 chest tube disloged. 6/19 bilateral chest tubes placed due to worsening respiratory distress. PMH includes: CLL, depression with anxiety, HTN, iron deficiency, western blot positive HSV2.   Clinical Impression   PTA patient reports living alone, using RW for mobility and ADLs/IADLs (but limited).  Admitted for above and presents with problem list below, including impaired balance, generalized weakness, and decreased activity tolerance. She currently completes ADLs with min to min guard assist, transfers and limited mobility in room with min guard to min assist +2 for safety and line mgmt. She will benefit from continued OT services while admitted and after dc at SNF level to optimize independence and safety with ADLs and mobility.  Will follow acutely.      Recommendations for follow up therapy are one component of a multi-disciplinary discharge planning process, led by the attending physician.  Recommendations may be updated based on patient status, additional functional criteria and insurance authorization.   Follow Up Recommendations  Skilled nursing-short term rehab (<3 hours/day)    Assistance Recommended at Discharge Frequent or constant Supervision/Assistance  Patient can return home with the following A little help with walking and/or transfers;A little help with  bathing/dressing/bathroom;Assistance with cooking/housework;Direct supervision/assist for medications management;Direct supervision/assist for financial management;Assist for transportation;Help with stairs or ramp for entrance    Functional Status Assessment  Patient has had a recent decline in their functional status and demonstrates the ability to make significant improvements in function in a reasonable and predictable amount of time.  Equipment Recommendations  Other (comment) (defer)    Recommendations for Other Services       Precautions / Restrictions Precautions Precautions: Fall;Other (comment) Precaution Comments: bilateral chest tubes Restrictions Weight Bearing Restrictions: No      Mobility Bed Mobility Overal bed mobility: Needs Assistance Bed Mobility: Supine to Sit     Supine to sit: Min guard     General bed mobility comments: for ling mgmt and safety, increased time    Transfers                          Balance Overall balance assessment: Needs assistance Sitting-balance support: No upper extremity supported, Feet supported Sitting balance-Leahy Scale: Good     Standing balance support: Bilateral upper extremity supported, During functional activity Standing balance-Leahy Scale: Fair Standing balance comment: relies on RW                           ADL either performed or assessed with clinical judgement   ADL Overall ADL's : Needs assistance/impaired     Grooming: Set up;Sitting           Upper Body Dressing : Minimal assistance;Sitting   Lower Body Dressing: Minimal assistance;Sit to/from stand   Toilet Transfer: Minimal assistance;Ambulation;+2 for safety/equipment;Rolling walker (2 wheels) Toilet Transfer Details (indicate cue type and reason): simulated to recliner  Functional mobility during ADLs: Minimal assistance;Rolling walker (2 wheels);Cueing for safety;Cueing for sequencing       Vision    Vision Assessment?: No apparent visual deficits     Perception     Praxis      Pertinent Vitals/Pain Pain Assessment Pain Assessment: Faces Faces Pain Scale: Hurts a little bit Pain Location: generalized Pain Descriptors / Indicators: Discomfort Pain Intervention(s): Limited activity within patient's tolerance, Monitored during session, Repositioned     Hand Dominance Right   Extremity/Trunk Assessment Upper Extremity Assessment Upper Extremity Assessment: Generalized weakness   Lower Extremity Assessment Lower Extremity Assessment: Defer to PT evaluation       Communication Communication Communication: No difficulties   Cognition Arousal/Alertness: Awake/alert Behavior During Therapy: WFL for tasks assessed/performed Overall Cognitive Status: Within Functional Limits for tasks assessed                                 General Comments: pt engaging apprpriately, asking questions about SNF vs aide care; no family present but appears WFL.     General Comments  VSs on RA    Exercises     Shoulder Instructions      Home Living Family/patient expects to be discharged to:: Skilled nursing facility Living Arrangements: Alone Available Help at Discharge: Available PRN/intermittently Type of Home: Apartment Home Access: Level entry     Home Layout: One level     Bathroom Shower/Tub: Tub/shower unit         Home Equipment: Conservation officer, nature (2 wheels);Shower seat          Prior Functioning/Environment Prior Level of Function : Independent/Modified Independent             Mobility Comments: independent with RW pprior to ilness ADLs Comments: reports able to complete light IADLs and manage ADLs        OT Problem List: Decreased strength;Decreased activity tolerance;Impaired balance (sitting and/or standing);Cardiopulmonary status limiting activity;Decreased knowledge of precautions;Decreased knowledge of use of DME or AE;Decreased safety  awareness      OT Treatment/Interventions: Self-care/ADL training;Therapeutic exercise;DME and/or AE instruction;Therapeutic activities;Balance training;Patient/family education;Energy conservation    OT Goals(Current goals can be found in the care plan section) Acute Rehab OT Goals Patient Stated Goal: get better, get more help OT Goal Formulation: With patient Time For Goal Achievement: 03/20/22 Potential to Achieve Goals: Good  OT Frequency: Min 2X/week    Co-evaluation PT/OT/SLP Co-Evaluation/Treatment: Yes Reason for Co-Treatment: To address functional/ADL transfers;For patient/therapist safety;Complexity of the patient's impairments (multi-system involvement)   OT goals addressed during session: ADL's and self-care      AM-PAC OT "6 Clicks" Daily Activity     Outcome Measure Help from another person eating meals?: A Little Help from another person taking care of personal grooming?: A Little Help from another person toileting, which includes using toliet, bedpan, or urinal?: A Little Help from another person bathing (including washing, rinsing, drying)?: A Little Help from another person to put on and taking off regular upper body clothing?: A Little Help from another person to put on and taking off regular lower body clothing?: A Little 6 Click Score: 18   End of Session Equipment Utilized During Treatment: Rolling walker (2 wheels) Nurse Communication: Mobility status  Activity Tolerance: Patient tolerated treatment well Patient left: in chair;with call bell/phone within reach;with chair alarm set  OT Visit Diagnosis: Other abnormalities of gait and mobility (R26.89);History of falling (Z91.81);Muscle weakness (generalized) (  M62.81)                Time: 2449-7530 OT Time Calculation (min): 15 min Charges:  OT General Charges $OT Visit: 1 Visit OT Evaluation $OT Eval Moderate Complexity: 1 Mod  Jolaine Artist, OT Acute Rehabilitation Services Office  [phone]   Delight Stare 03/06/2022, 11:40 AM

## 2022-03-06 NOTE — Evaluation (Signed)
Physical Therapy Evaluation Patient Details Name: [name] MRN: [id number] DOB: 16-Oct-1960 Today's Date: 03/06/2022  History of Present Illness  Pt is a 61 y/o female presenting on 6/2 after calling EMS of abdominal pain and becoming unresponsive with AMS. Found hypotensive and hypoxic, intubated 6/2-6/4. CLL/SLL with bulky mediastinal adenopathy.  CXR with large L and small R pleural effusion. 6/3 R chest tube placed, removed 6/10. 6/12 R chest tube placement.  Transferred from [facility] to [facility] on 6/13 for initation of palliative radiotherapy. 6/15 fell with imaging negative, mod-large effusion on L. 6/18 chest tube disloged. 6/19 bilateral chest tubes placed due to worsening respiratory distress. PMH includes: CLL, depression with anxiety, HTN, iron deficiency, western blot positive HSV2.  Clinical Impression  Patient is very pleasant and motivated to get to recliner. Patient requires min guard/min assistance for line management. Patient able to take a few steps to recliner. Patient may tolerate short distance ambulation soon, Not noted to be SOB, Bil. Chest tubes intact during transfers.  SPO2.94%.  Pt admitted with above diagnosis.  Pt currently with functional limitations due to the deficits listed below (see PT Problem List). Pt will benefit from skilled PT to increase their independence and safety with mobility to allow discharge to the venue listed below.          Recommendations for follow up therapy are one component of a multi-disciplinary discharge planning process, led by the attending physician.  Recommendations may be updated based on patient status, additional functional criteria and insurance authorization.  Follow Up Recommendations Skilled nursing-short term rehab (<3 hours/day) Can patient physically be transported by private vehicle: Yes    Assistance Recommended at Discharge Frequent or constant Supervision/Assistance  Patient can return home with the following  A little help  with walking and/or transfers;Assistance with cooking/housework;Assist for transportation;A little help with bathing/dressing/bathroom    Equipment Recommendations None recommended by PT  Recommendations for Other Services       Functional Status Assessment Patient has had a recent decline in their functional status and demonstrates the ability to make significant improvements in function in a reasonable and predictable amount of time.     Precautions / Restrictions Precautions Precautions: Fall;Other (comment) Precaution Comments: bilateral chest tubes Restrictions Weight Bearing Restrictions: No      Mobility  Bed Mobility               General bed mobility comments: for line mgmt and safety, increased time    Transfers Overall transfer level: Needs assistance Equipment used: Rolling walker (2 wheels) Transfers: Sit to/from Stand, Bed to chair/wheelchair/BSC Sit to Stand: Min assist   Step pivot transfers: +2 safety/equipment, Min guard       General transfer comment: patient stands, steady on Walker, steps to recliner.    Ambulation/Gait                  Stairs            Wheelchair Mobility    Modified Rankin (Stroke Patients Only)       Balance                                             Pertinent Vitals/Pain Pain Assessment Faces Pain Scale: Hurts even more Pain Location: generalized Pain Descriptors / Indicators: Discomfort Pain Intervention(s): Monitored during session, Premedicated before session    Home  Living Family/patient expects to be discharged to:: Skilled nursing facility Living Arrangements: Alone Available Help at Discharge: Available PRN/intermittently Type of Home: Apartment Home Access: Level entry       Home Layout: One level Home Equipment: Conservation officer, nature (2 wheels);Shower seat      Prior Function Prior Level of Function : Independent/Modified Independent             Mobility  Comments: independent with RW prior to ilness ADLs Comments: reports able to complete light IADLs and manage ADLs     Hand Dominance   Dominant Hand: Right    Extremity/Trunk Assessment   Upper Extremity Assessment Upper Extremity Assessment: Generalized weakness    Lower Extremity Assessment Lower Extremity Assessment: Generalized weakness    Cervical / Trunk Assessment Cervical / Trunk Assessment: Normal  Communication   Communication: No difficulties  Cognition Arousal/Alertness: Awake/alert Behavior During Therapy: WFL for tasks assessed/performed Overall Cognitive Status: Within Functional Limits for tasks assessed                                 General Comments: pt engaging apprpriately, asking questions about SNF vs aide care; no family present but appears WFL.        General Comments General comments (skin integrity, edema, etc.): VSs on RA    Exercises     Assessment/Plan    PT Assessment Patient needs continued PT services  PT Problem List Decreased strength;Decreased knowledge of precautions;Decreased mobility;Decreased activity tolerance;Decreased safety awareness       PT Treatment Interventions DME instruction;Functional mobility training;Patient/family education;Gait training;Therapeutic activities;Therapeutic exercise    PT Goals (Current goals can be found in the Care Plan section)  Acute Rehab PT Goals Patient Stated Goal: I need to go stay where I can get help getting to my appts and my medication PT Goal Formulation: With patient Time For Goal Achievement: 03/20/22 Potential to Achieve Goals: Fair    Frequency Min 2X/week     Co-evaluation PT/OT/SLP Co-Evaluation/Treatment: Yes Reason for Co-Treatment: Complexity of the patient's impairments (multi-system involvement);To address functional/ADL transfers PT goals addressed during session: Mobility/safety with mobility OT goals addressed during session: ADL's and  self-care       AM-PAC PT "6 Clicks" Mobility  Outcome Measure Help needed turning from your back to your side while in a flat bed without using bedrails?: A Little Help needed moving from lying on your back to sitting on the side of a flat bed without using bedrails?: A Little Help needed moving to and from a bed to a chair (including a wheelchair)?: A Little Help needed standing up from a chair using your arms (e.g., wheelchair or bedside chair)?: A Little Help needed to walk in hospital room?: Total Help needed climbing 3-5 steps with a railing? : Total 6 Click Score: 14    End of Session   Activity Tolerance: Patient tolerated treatment well Patient left: in chair;with call bell/phone within reach;with chair alarm set Nurse Communication: Mobility status PT Visit Diagnosis: Unsteadiness on feet (R26.81);Difficulty in walking, not elsewhere classified (R26.2);Pain    Time: 1030-1100 PT Time Calculation (min) (ACUTE ONLY): 30 min   Charges:   PT Evaluation $PT Eval Low Complexity: 1 Low          [place] Office [phone] Weekend [phone]   Claretha Cooper 03/06/2022, 2:10 PM

## 2022-03-06 NOTE — Progress Notes (Signed)
NAME:  [name], MRN:  [id number], DOB:  Jul 12, 1961, LOS: 68 ADMISSION DATE:  02/21/2022, CONSULTATION DATE:  6/2 REFERRING MD:  Dr. Alvino Chapel, CHIEF COMPLAINT:  Unresponsive; Acute resp failure   BRIEF  Patient is a 61 year old female with pertinent PMH of CLL, depression, anxiety, HLD, HTN presents to [facility] ED on 6/2 unresponsive.  On 6/2 patient called EMS for abdominal pain.  Patient initially was alert and talking but became unresponsive with AMS.  Patient was hypoxic with agonal breathing and was placed on NRB.  Patient was noted to be hypotensive.  Patient transported to [facility] ED.  Upon arrival to [facility] ED patient remained unresponsive with agonal respirations.  Patient intubated for airway protection and hypoxia.  BP initially 81/65.  Given IV fluids.  Started on levo.  Fever 100.9 F and WBC 104.4.  Started on cefepime, Flagyl, Vanco.  Cultures pending.  Glucose 198, BNP 534, troponin 109 then 112, LA 4.4 then 1.8, UA unremarkable, UDS negative.  CXR with large left pleural effusion and small right pleural effusion.  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  CT abdomen with ascending colitis constipation, small intestine wall thickening possibly related to gastroenteritis; small volume ascites; extensive abdominopelvic and inguinal adenopathy.   PCCM was consulted for bilateral pleural effusion  Pertinent  Medical History    has a past medical history of Anxiety, Chronic abdominal pain, Chronic lymphocytic leukemia (CLL), B-cell ([place]) (04/04/2016), CLL (chronic lymphocytic leukemia) ([place]), Depression with anxiety (07/07/2015), Family history of hyperlipidemia (12/14/2019), H. 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   has a past surgical history that includes Surgery of left middle finger on left hand (childhood ); Tubal ligation; Cholecystectomy; Colonoscopy (02/07/2011); Hammer toe surgery (Left); Axillary lymph node biopsy (Right, 03/28/2016); Hysteroscopy with D & C (N/A, 07/30/2016); Thoracentesis (Left, 02/23/2022); and Chest tube insertion (N/A, 02/21/2022).  Significant Hospital Events: Including procedures, antibiotic start and stop dates in addition to other pertinent events   6/2 Presented [facility] ER unresponsive, intubated, shock on pressors > to [facility].  CT chest/abd w/ extensive LAN 6/3 Right chest tube placed. Left thora 2000 ml removed - milky 6/4 Extubated, off pressors.  Pleural fluid exudative, cx negative, lymphocytic. 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 Started on pulse dose steroids '50mg'$  pred.  6/7 Chest tube output over a liter 6/8 Left thoracentesis for 1400 cc of fluid 6/10 Chest tube removed 6/12 S/p right chest tube placement with 3.1 L drained 6/3 Tx from [facility] to [facility] 6/13 for initiation of palliative radiotherapy 6/14 No chest pain. Saturation 96% on 2 L. Chest tube drained 190 cc of milky fluid 6/15 Fell. CXR stable. Doing well. Got SIM for XRT.  RA.  Mod-large effusion on L. Refused CT. Octreotide. 6/16 TPN.  On [place] O2 . R CT 1000 cc out.  Started XRT 6/15, rituximab is being considered 6/17 CT issues with placement. Not sure if she wants new chest tube.  6/18 Dislodged right chest tube completely.  6/19 PleurX deferred due to poor candidate outpatient. Bilateral chest tubes placed due to worsening respiratory distress.  6/21 2.8L out from CT in last 24 hours. IR consulted for thoracic duct embolization.  Interim History / Subjective:  Afebrile  4.2L out of chest tubes in last 24 hours, -2.5L in last 24 hours  Pt reports feeling better, ate some breakfast  Pending transport to XRT   Objective   Blood pressure 100/67, pulse (!)  111, temperature 98.3 F (36.8 C), temperature source Oral, resp. rate 16, height '5\' 5"'$  (1.651 m), weight 48.2 kg, last menstrual period 07/25/2016, SpO2 96 %.        Intake/Output Summary (Last 24 hours) at 03/06/2022 0801 Last data filed at 03/06/2022 1610 Gross per 24 hour  Intake 2771.69 ml  Output 4925 ml  Net -2153.31 ml   Filed Weights   03/04/22 1228 03/05/22 0439 03/06/22 0500  Weight: 47.7 kg 47.3 kg 48.2 kg   Physical Exam:  General: frail/thin adult female lying in bed in NAD HEENT: MM pink/moist, anicteric, pupils =/reactive  Neuro: AAOx4, speech clear, MAE CV: s1s2 RRR, no m/r/g PULM: non-labored at rest, lungs bilaterally with good air entry, mild tachypnea but not distressed, bilateral chest tubes with opaque white/pink drainage  GI: soft, bsx4 active  Extremities: warm/dry, no edema, LUE PICC  Skin: no rashes or lesions  CXR 6/22 > images personally reviewed, CT's bilaterally in good position above diaphragm, clear bilaterally   Assessment & Plan:   CLL/SLL with bulky Mediastinal Adenopathy  Thoracentesis on 02/15/22 showed atypical lymphocytes suspicious for lymphoproliferative process. right axillary node was biopsies on 02/18/22 showing B cells consistent with her history of lymphoma and CLL -Plan of care per ONC/Rad ONC -continue prednisone 50 mg QD -Palliative radiation to mediastinum pending 6/22 -appreciate Palliative Care  Acute Hypoxemic Respiratory Failure secondary to bilateral Pleural Effusions.  Bilateral Chylothorax  Given known CLL history now with bulky mediastinal lymphadenopathy. Left thoracentesis 6/8 with 1431m drained. Right chest tube 6/3 with heavy output. Removed 6/10. Replaced 6/12 with 3+ liters of output. She had been hesitant regarding chet tube on the L because having the right sided tube in was uncomfortable. There was plan for PleurX drain on the L, however, she declined rapidly on 6/19 and [place] no support [place] home [place].  Bilateral chest tubes were placed instead.  -CT to gravity drainage  -CT care with dressing change per protocol  -follow intermittent CXR -wean O2 for sats >92% -monitor CT drainage, frequent sahara changes required  -PRN morphine for air hunger -follow up pleural culture from 6/19 -TPN + Octreotide in an attempt to slow chyle production  -appreciate IR evaluation, reviewing for possible thoracic duct embolization   Severe Protein Calorie Malnutrition  -continue TPN per pharmacy  -diet as tolerated   Chronic HFpEF due to Uncontrolled Hypertension -per TRH   Hypotension  In setting of chronic disease, profound malnutrition  -continue midodrine    Best Practice:  [place].     BNoe Gens MSN, APRN, NP-C, AGACNP-BC [facility] 03/06/2022, 8:01 AM   Please see [email or web address] for pager details.   From 7A-7P if no response, please call [phone] After hours, please call ELink [phone]

## 2022-03-06 NOTE — Progress Notes (Signed)
PROGRESS NOTE [name]  XMI:[id number] DOB: 16-Feb-1961 DOA: 03/02/2022 PCP: Noreene Larsson, NP   Brief Narrative/Hospital Course: 51 yof  w/ CLL, depression, anxiety, HLD, HTN presented to [facility] ED on 6/2 unresponsive. On 6/2 patient called EMS for abdominal pain.  Patient initially was alert and talking but became unresponsive with AMS, hypoxic with agonal breathing and was placed on NRB, was noted to be hypotensive.In ED at [facility] remained unresponsive with agonal respirations and was intubated for airway protection and hypoxia.BP initially 81/65.  Given IV fluids.  Started on levo.  Fever 100.9 F and WBC 104.4.  Started on cefepime, Flagyl, Vanco.  Cultures pending.  Glucose 198, BNP 534, troponin 109 then 112, LA 4.4 then 1.8, UA unremarkable, UDS negative.  CXR with large left pleural effusion and small right pleural effusion.  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  CT abdomen with ascending colitis constipation, small intestine wall thickening possibly related to gastroenteritis; small volume ascites; extensive abdominopelvic and inguinal adenopathy.  PCCM consulted for ICU admission and transferred to [facility]. 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  Has been off pressors and subsequently transferred to [facility] service 6/6 Her encephalopathy was probably due to hypercarbia followed by oncology PCCM. 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  Chest tube continues to have a chylothorax managed by pulmonary  She has had recurrent chylothorax with ongoing chest tube and patient transferred to [facility] long 02/27/22 started radiation treatment to see whether this will help with lymphadenopathy from CLL. 6/15-slid up from the chair while trying to get up-PICC line got dislodged and reinserted no bruising- coccyx,l-spine xray neg. 6/18 am-chest tube got dislodged patient reluctant for reinsertion, subsequent implant PleurX catheter for 6/19.Patient with ongoing tachypnea having worsening respiratory disease, unable to lay flat for radiation therapy and had further discussion by PCCM underwent bilateral chest tube placement 6/19 as bedside, at the Pleurx may not be ideal as she will not be able to take care of this at home and will have difficult placement with Pleurx.  Subjective: Feels that her breathing is better today.  Shortness of breath is better.  No chest pain.  Sitting up in chair.  Assessment and Plan: Principal Problem:   Acute respiratory failure with hypoxia (HCC) Active Problems:   Sepsis ([place])   CLL (chronic lymphocytic leukemia) (HCC)   Shock ([place])   Chronic diastolic heart failure ([place])   DNR (do not resuscitate)   Protein-calorie malnutrition, severe   Pressure injury of skin   Bilateral chylothorax   Small cell B-cell lymphoma (HCC)   Leukocytosis   DM type 2 (diabetes mellitus, type 2) ([place])  Acute respiratory failure with hypoxia,Initially on vent on admit > on [place] now,stable. Bilateral chylothorax, w/ persistent output: Respiratory status/respiratory failure due to bilateral chylothorax in the setting of CLL.  Had chest tube in place on rt- but 03/02/22 am-got dislodged patient reluctant for reinsertion, subsequent implant PleurX catheter for 6/19.Patient with ongoing tachypnea having worsening  respiratory disease, unable to lay flat for radiation therapy and had  further discussion by PCCM underwent bilateral chest tube placement 6/19 as bedside, at the Pleurx may not be ideal as she will not be able to take care of this at home and will have difficult placement with Pleurx.  Continues to have significant output from chest tubes.  Being managed by PCCM.  On octreotide and TPN in attempt to decrease chyle production. Dr. Carlis Abbott discussed case with Dr. Kipp Brood, CVTS who recommended IR consult for possible lymphoscintigraphy and coil embolization of the thoracic duct  Sepsis/Shock/Hypotension poa: completed 5 days of antibiotics, no clear source was identified.  Having hypotension after bilateral chest tube placement 6/20 a.m. and placed on midodrine. Hemodynamics currently stable  CLL/SLL Significant leukocytosis: Oncology following> currently on prednisone 50 mg daily and XRT started 6/15> unable to get radiation 6/19 and unable to lay flat> now with chest tubes in place hopefully will be able to continue radiation therapy.  Dr. Lorenso Courier and radiation oncology team following.  Continues to have significant leukocytosis, although improved since admission.  Monitor Recent Labs  Lab 03/02/22 0841 03/03/22 0620 03/04/22 0447 03/05/22 0503 03/06/22 0520  WBC 84.9* 85.9* 49.7* 51.3* 60.3*  Chronic diastolic heart failure: Compensated, got lasix iv x1 6/17,monitor closely.Net IO Since Admission: -10,438.74 mL [03/06/22 1915] . Filed Weights   03/04/22 1228 03/05/22 0439 03/06/22 0500  Weight: 47.7 kg 47.3 kg 48.2 kg    Hyperkalemia resolved.  Hypokalemia repleted.  T2DM, new onset A1c 6.5, w/ uncontrolled hyperglycemia- and episode of hypoglycemia while holding TPN.  Currently blood sugar stable continue Semglee 10 units, SSI, monitor. Recent Labs  Lab 03/06/22 0339 03/06/22 0729 03/06/22 0931 03/06/22 1106 03/06/22 1532  GLUCAP 113* 154* 172* 192* 212*   Protein-calorie malnutrition, severe: Augment nutritional status, cont TPN, continue supplement   Nutrition Problem: Severe Malnutrition Etiology: chronic illness (CLL) Signs/Symptoms: severe muscle depletion, severe fat depletion Interventions: Boost Breeze  Deconditioning/debility: PT OT as tolerated but limited due to chest tubes.Pt slided to floor-unwitnessed from [place] few several days ago- xray coccyx, L-spine neg.   Goals of care  -currently DNR -palliative care following  DVT prophylaxis: enoxaparin (LOVENOX) injection 40 mg Start: 02/16/22 1515 Code Status:   Code Status: DNR Family Communication: plan of care discussed with patient/ spoke w/ son on phone 6/18, updated by [facility] 6/19 Patient status is: inpatient because continues to require bilateral chest tube placement Level of care: Stepdown   Dispo: The patient is from: Home, independent.            Anticipated disposition: Will need skilled nursing facility    Mobility Assessment (last 72 hours)     Mobility Assessment     Row Name 03/06/22 1407 03/06/22 1100 03/05/22 0810 03/04/22 0740 03/03/22 2000   Does patient have an order for bedrest or is patient medically unstable -- -- No - Continue assessment No - Continue assessment No - Continue assessment   What is the highest level of mobility based on the progressive mobility assessment? Level 4 (Walks with assist in room) - Balance while marching in place and cannot step forward and back - Complete Level 3 (Stands with assist) - Balance while standing  and cannot march in place Level 2 (Chairfast) - Balance while sitting on edge of bed and cannot stand Level 1 (Bedfast) - Unable to balance while sitting on edge of bed  due to Bilateral chest tubes Level 2 (Chairfast) - Balance while sitting on  edge of bed and cannot stand   Is the above level different from baseline mobility prior to current illness? -- -- -- -- Yes - Recommend PT order             Objective: Vitals last 24 hrs: Vitals:   03/06/22 1400 03/06/22 1500 03/06/22 1600 03/06/22 1700  BP: 97/75 117/68  105/67 127/89  Pulse: 95 62 98 98  Resp: (!) 30 (!) 23 (!) 23 (!) 28  Temp:  98.8 F (37.1 C)    TempSrc:  Oral    SpO2: 99% 90% 99% 97%  Weight:      Height:       Weight change: 0.5 kg  Physical Examination: General exam: AA0X3, ill looking, frail, thin older than stated age, weak appearing. HEENT:Oral mucosa moist, Ear/Nose WNL grossly, dentition normal. Respiratory system: bilaterally diminished at best clear lung sounds on upper airways, chest tube placement bilateral chest, no use of accessory muscle Cardiovascular system: S1 & S2 +, No JVD,. Gastrointestinal system: Abdomen soft,NT,ND,BS+ Nervous System:Alert, awake, moving extremities and grossly nonfocal Extremities: LE ankle edema neg, distal peripheral pulses palpable.  Skin: No rashes,no icterus. MSK: Normal muscle bulk,tone, power  Lue picc+  Medications reviewed: Scheduled Meds:  chlorhexidine gluconate (MEDLINE KIT)  15 mL Mouth Rinse BID   Chlorhexidine Gluconate Cloth  6 each Topical Daily   docusate sodium  100 mg Oral BID   enoxaparin (LOVENOX) injection  40 mg Subcutaneous Q24H   feeding supplement  1 Container Oral TID BM   insulin aspart  0-15 Units Subcutaneous Q4H   insulin glargine-yfgn  10 Units Subcutaneous Daily   midodrine  5 mg Oral TID WC   octreotide  50 mcg Subcutaneous TID   polyethylene glycol  17 g Oral Daily   predniSONE  50 mg Oral Q breakfast   sodium chloride flush  10 mL Other Q8H   sodium chloride flush  10 mL Other Q8H   sodium chloride flush  10-40 mL Intracatheter Q12H   Continuous Infusions:  sodium chloride Stopped (03/03/22 1302)   sodium chloride Stopped (03/06/22 0958)   TPN ADULT (ION) 100 mL/hr at 03/06/22 1707   Diet Order             Diet regular Room service appropriate? Yes; Fluid consistency: Thin  Diet effective now                    Nutrition Problem: Severe Malnutrition Etiology: chronic illness (CLL) Signs/Symptoms: severe muscle depletion,  severe fat depletion Interventions: Boost Breeze   Intake/Output Summary (Last 24 hours) at 03/06/2022 1915 Last data filed at 03/06/2022 1830 Gross per 24 hour  Intake 4132.78 ml  Output 4125 ml  Net 7.78 ml   Net IO Since Admission: -10,438.74 mL [03/06/22 1915]  Wt Readings from Last 3 Encounters:  03/06/22 48.2 kg  03/08/20 64.4 kg  02/07/20 68.5 kg     Unresulted Labs (From admission, onward)     Start     Ordered   03/05/22 1744  Cholesterol, body fluid  (Thoracentesis Labs Panel)  Once,   R       Comments: left    03/05/22 1743   03/05/22 1744  Triglycerides, Body Fluid  (Thoracentesis Labs Panel)  Once,   R       Comments: left    03/05/22 1743   03/05/22 1610  Basic metabolic panel  Daily,   R     Question:  Specimen collection  method  Answer:  Lab=Lab collect   03/04/22 1051   03/04/22 1004  Pathologist smear review  Once,   R        03/04/22 1004   02/27/22 0500  Comprehensive metabolic panel  (TPN Lab Panel)  Every Mon,Thu (0500),   R     Question:  Specimen collection method  Answer:  Unit=Unit collect   02/25/22 1228   02/27/22 0500  Magnesium  (TPN Lab Panel)  Every Mon,Thu (0500),   R     Question:  Specimen collection method  Answer:  Unit=Unit collect   02/25/22 1228   02/27/22 0500  Phosphorus  (TPN Lab Panel)  Every Mon,Thu (0500),   R     Question:  Specimen collection method  Answer:  Unit=Unit collect   02/25/22 1228   02/27/22 0500  Triglycerides  (TPN Lab Panel)  Every Mon,Thu (0500),   R     Question:  Specimen collection method  Answer:  Unit=Unit collect   02/25/22 1228   03/06/2022 1442  Acid Fast Culture with reflexed sensitivities  (AFB smear + Culture w reflexed sensitivities panel)  Once,   R       See Hyperspace for full Linked Orders Report.   03/09/2022 1441          Data Reviewed: I have personally reviewed following labs and imaging studies CBC: Recent Labs  Lab 03/02/22 0841 03/03/22 0620 03/04/22 0447 03/05/22 0503  03/06/22 0520  WBC 84.9* 85.9* 49.7* 51.3* 60.3*  HGB 11.2* 11.3* 8.6* 9.9* 10.4*  HCT 36.0 36.0 27.1* 31.8* 33.0*  MCV 92.5 93.8 94.4 95.8 96.2  PLT 291 297 194 201 800   Basic Metabolic Panel: Recent Labs  Lab 02/28/22 0422 02/28/22 0727 03/01/22 0355 03/02/22 0841 03/03/22 0620 03/05/22 0503 03/06/22 0554  NA 135  --  136 139 140 141 141  K 5.6*   < > 3.8 3.1* 3.9 4.3 3.4*  CL 100  --  103 102 104 108 111  CO2 29  --  _0 GLUCOSE 187*  --  158* 156* 96 137* 195*  BUN 27*  --  27* 32* 31* 33* 34*  CREATININE 0.43*  --  0.37* 0.37* 0.31* 0.41* 0.42*  CALCIUM 7.5*  --  7.5* 7.9* 8.4* 8.4* 7.8*  MG 1.8  --  2.0  --  2.1 2.0 1.7  PHOS 2.6  --  3.1  --  2.8 2.9 2.9   < > = values in this interval not displayed.   GFR: Estimated Creatinine Clearance: 56.2 mL/min (A) (by C-G formula based on SCr of 0.42 mg/dL (L)). Liver Function Tests: Recent Labs  Lab 03/03/22 0620 03/06/22 0554  AST 32 33  ALT 31 45*  ALKPHOS 74 57  BILITOT 0.3 0.6  PROT 5.1* 4.4*  ALBUMIN 2.2* 2.8*   No results for input(s): "LIPASE", "AMYLASE" in the last 168 hours. No results for input(s): "AMMONIA" in the last 168 hours. Coagulation Profile: No results for input(s): "INR", "PROTIME" in the last 168 hours. BNP (last 3 results) No results for input(s): "PROBNP" in the last 8760 hours. HbA1C: No results for input(s): "HGBA1C" in the last 72 hours. CBG: Recent Labs  Lab 03/06/22 0339 03/06/22 0729 03/06/22 0931 03/06/22 1106 03/06/22 1532  GLUCAP 113* 154* 172* 192* 212*   Lipid Profile: Recent Labs    03/06/22 0520 03/06/22 0554  TRIG SPECIMEN CONTAMINATED, UNABLE TO PERFORM TEST(S). 48   Thyroid Function Tests:  No results for input(s): "TSH", "T4TOTAL", "FREET4", "T3FREE", "THYROIDAB" in the last 72 hours. Sepsis Labs: No results for input(s): "PROCALCITON", "LATICACIDVEN" in the last 168 hours.  Recent Results (from the past 240 hour(s))  Body fluid culture w  Gram Stain     Status: None (Preliminary result)   Collection Time: 03/04/22 10:00 AM   Specimen: Pleural Fluid  Result Value Ref Range Status   Specimen Description   Final    PLEURAL RT Performed at [place] [address] [place], [place] [address]    Special Requests   Final    NONE Performed at [facility], [place] [address], [place], [place] [address]    Gram Stain NO WBC SEEN NO ORGANISMS SEEN   Final   Culture   Final    NO GROWTH 2 DAYS Performed at [facility], [place] [address] [place], [place] [address]    Report Status PENDING  Incomplete  Body fluid culture w Gram Stain     Status: None (Preliminary result)   Collection Time: 03/04/22 10:00 AM   Specimen: Pleural Fluid  Result Value Ref Range Status   Specimen Description   Final    PLEURAL LT Performed at [place] [address] [place], [place] [address]    Special Requests   Final    NONE Performed at [facility], [place] [address], [place], [place] [address]    Gram Stain   Final    FEW WBC PRESENT,BOTH PMN AND MONONUCLEAR NO ORGANISMS SEEN    Culture   Final    NO GROWTH 2 DAYS Performed at [facility], [place] [address] [place], [place] [address]    Report Status PENDING  Incomplete     Antimicrobials: Anti-infectives (From admission, onward)    Start     Dose/Rate Route Frequency Ordered Stop   02/17/22 1600  metroNIDAZOLE (FLAGYL) IVPB 500 mg        500 mg 100 mL/hr over 60 Minutes Intravenous Every 12 hours 02/17/22 0713 02/18/22 1926   02/16/22 1600  ceFEPIme (MAXIPIME) 2 g in sodium chloride 0.9 % 100 mL IVPB        2 g 200 mL/hr over 30 Minutes Intravenous Every 12 hours 02/16/22 0818 02/18/22 1847   02/16/22 1500  metroNIDAZOLE (FLAGYL) IVPB 500 mg  Status:  Discontinued        500 mg 100 mL/hr over 60 Minutes Intravenous Every 12 hours 02/16/22 1404 02/17/22 0711   02/15/22 1800  vancomycin  (VANCOREADY) IVPB 1250 mg/250 mL  Status:  Discontinued        1,250 mg 166.7 mL/hr over 90 Minutes Intravenous Every 24 hours 02/27/2022 1955 02/15/22 0737   02/15/22 1415  metroNIDAZOLE (FLAGYL) IVPB 500 mg  Status:  Discontinued        500 mg 100 mL/hr over 60 Minutes Intravenous Every 12 hours 02/15/22 1325 02/16/22 0840   02/15/22 0400  ceFEPIme (MAXIPIME) 2 g in sodium chloride 0.9 % 100 mL IVPB  Status:  Discontinued        2 g 200 mL/hr over 30 Minutes Intravenous Every 8 hours 02/21/2022 1955 02/16/22 0817   03/13/2022 2000  vancomycin (VANCOREADY) IVPB 1250 mg/250 mL  Status:  Discontinued        1,250 mg 166.7 mL/hr over 90 Minutes Intravenous Every 24 hours 03/02/2022 1952 03/14/2022 1955   02/16/2022 1945  ceFEPIme (MAXIPIME) 2 g in sodium chloride  0.9 % 100 mL IVPB        2 g 200 mL/hr over 30 Minutes Intravenous  Once 03/04/2022 1931 03/04/2022 2051   02/13/2022 1945  metroNIDAZOLE (FLAGYL) IVPB 500 mg        500 mg 100 mL/hr over 60 Minutes Intravenous  Once 02/23/2022 1931 02/25/2022 2053   03/12/2022 1945  vancomycin (VANCOCIN) IVPB 1000 mg/200 mL premix  Status:  Discontinued        1,000 mg 200 mL/hr over 60 Minutes Intravenous  Once 02/19/2022 1931 03/04/2022 2053      Culture/Microbiology    Component Value Date/Time   SDES  03/04/2022 1000    PLEURAL RT Performed at [facility], [place] [address] [place], [place] [address]    SDES  03/04/2022 1000    PLEURAL LT Performed at [facility], [place] [address], [place], [place] [address]    SPECREQUEST  03/04/2022 1000    NONE Performed at [facility], [place] [address] [place], [place] [address]    SPECREQUEST  03/04/2022 1000    NONE Performed at [facility], [place] [address], [place], [place] [address]    CULT  03/04/2022 1000    NO GROWTH 2 DAYS Performed at [place] [address] [place], [place] [address]    CULT  03/04/2022 1000    NO  GROWTH 2 DAYS Performed at [place] [address], [place], [place] [address]    REPTSTATUS PENDING 03/04/2022 1000   REPTSTATUS PENDING 03/04/2022 1000    Other culture-see note  Radiology Studies: DG CHEST PORT 1 VIEW  Result Date: 03/06/2022 CLINICAL DATA:  Pneumothorax, chylothorax due to malignancy EXAM: PORTABLE CHEST 1 VIEW COMPARISON:  Multiple prior chest radiographs, most recent 03/05/2022 FINDINGS: Left upper extremity PICC tip overlies the upper aspect of the right atrium. The cardiomediastinal silhouette is unchanged. There are bibasilar chest tubes in place with decreased pleural effusions and mid to lower lung airspace opacities in comparison to prior exam. There are small biapical pneumothoraces post slightly decreased from prior exam. Unchanged mild interstitial prominence. Bones are unchanged. IMPRESSION: Decreased pleural effusions and adjacent atelectasis with stable position of bibasilar chest tubes. Small biapical pneumothoraces, both slightly decreased in size in comparison to prior. Electronically Signed   By: Maurine Simmering M.D.   On: 03/06/2022 07:37   DG CHEST PORT 1 VIEW  Result Date: 03/05/2022 CLINICAL DATA:  Pleural effusion EXAM: PORTABLE CHEST 1 VIEW COMPARISON:  Chest x-ray dated March 04, 2022 FINDINGS: Cardiac and mediastinal contours are unchanged. Unchanged position of left arm PICC. Small right pleural effusion. Stable small right pleural effusion with new right apical pneumothorax. Bilateral chest tubes in place. Lower lung predominant heterogeneous opacities, likely due to atelectasis. IMPRESSION: 1. Stable small right pleural effusion with new right apical pneumothorax. 2. Small left pleural effusion is decreased in size when compared with prior exam, possible new left apical pneumothorax versus skin fold artifact. Recommend attention on follow-up. Electronically Signed   By: Yetta Glassman M.D.   On: 03/05/2022 08:25     LOS: 20 days   Kathie Dike, MD [facility]  03/06/2022, 7:15 PM

## 2022-03-06 NOTE — Progress Notes (Signed)
PHARMACY - TOTAL PARENTERAL NUTRITION CONSULT NOTE   Indication:  bilateral chylothorax with high chest tube output  Patient Measurements: Height: '5\' 5"'$  (165.1 cm) Weight: 48.2 kg (106 lb 4.2 oz) IBW/kg (Calculated) : 57 TPN AdjBW (KG): 64 Body mass index is 17.68 kg/m.  Assessment: 60 year old female with pertinent PMH of CLL, depression, anxiety, HLD, HTN presents to [facility] ED on 6/2 unresponsive.  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 Patient also has bilateral chylothorax with high output. Pharmacy has been consulted for TPN.  Glucose / Insulin: new onset DM2, A1c 6.5  On mSSI q4h, Semglee 10 units daily - used 18 units of SSI past 24 hrs - CBGs (goal <180) - range 113-184 - on prednisone 50 mg daily  Electrolytes: All WNL Renal: Scr < 1, BUN slightly elevated but stable Hepatic: LFTs WNL - TG stable - albumin low Intake / Output: 24 hr UOP: 975 mL, Chest tube output: 2400 mL - Chest tube dislodged 6/18 am, chest tubes left and right placed 6/19 - 240 mL PO intake MIVF:  none  GI Imaging: 6/10 CT Chest Bilateral large pleural effusions and bulky axillary and lower cervical lymphadenopathy  GI Surgeries / Procedures: None this admission  Central access: PICC placed 6/15 TPN start date: 6/13  Nutritional Goals: Goal TPN rate is 100 mL/hr (provides 150 g of protein and 2357 kcals per day)  RD Assessment: Estimated Needs Total Energy Estimated Needs: 2300-2600 Total Protein Estimated Needs: 140-160 grams Total Fluid Estimated Needs: >2.5 L  Current Nutrition:  - Low fat diet, no more than 7 g fat per meal - Boost/resource breeze BID (all charted) - TPN at goal rate  Significant Events:  - 6/14: TPN turned off at ~10:30p for transport to [facility]. D10 started while off TPN - 6/16 am TPN off/on D10 for K 5.6, TPN resumed 1800  Plan:  - Potassium 40 mEq PO + magnesium 2 g IV to be given this morning - Continue TPN at 100 ml/hr - Patient remains on TPN  as she is unable to meet her caloric needs with enteral nutrition at this time. - Lipids > 400 this am - suspect lab likely drawn from central line. Order for stat repeat 48 - no adjustments to lipids in TPN indicated. - Electrolytes in TPN: No changes Na 50 mEq/L K   25 mEq/L - increased to 60 mEq total (10 mEq increase) for slight hyperkalemia 6/22 am  Ca 5 mEq/L Mg 5 mEq/L Phos 10 mmol/L Cl:Ac 1:1 - Add standard MVI and trace elements to TPN - Continue Semglee 10 units daily - Continue moderate SSI q4h and adjust as needed - Monitor TPN labs on Mon/Thurs   Tawnya Crook, PharmD, BCPS Clinical Pharmacist 03/06/2022 10:09 AM

## 2022-03-06 NOTE — Progress Notes (Addendum)
Pt seen, awake, alert, HR105, rr25, spo2 95% on room air.  No increase wob / respiratory distress noted or voiced by patient.  Bipap not indicated at this time.

## 2022-03-07 ENCOUNTER — Other Ambulatory Visit: Payer: Self-pay

## 2022-03-07 ENCOUNTER — Inpatient Hospital Stay (HOSPITAL_COMMUNITY): Payer: Medicaid Other

## 2022-03-07 ENCOUNTER — Ambulatory Visit
Admit: 2022-03-07 | Discharge: 2022-03-07 | Disposition: A | Discharge status: Home / Self Care | Payer: Medicaid Other | Attending: Radiation Oncology | Admitting: Radiation Oncology

## 2022-03-07 ENCOUNTER — Ambulatory Visit: Payer: Medicaid Other

## 2022-03-07 DIAGNOSIS — J940 Chylous effusion: Secondary | ICD-10-CM | POA: Diagnosis not present

## 2022-03-07 DIAGNOSIS — J9601 Acute respiratory failure with hypoxia: Secondary | ICD-10-CM | POA: Diagnosis not present

## 2022-03-07 DIAGNOSIS — Z4682 Encounter for fitting and adjustment of non-vascular catheter: Secondary | ICD-10-CM | POA: Diagnosis not present

## 2022-03-07 LAB — GLUCOSE, CAPILLARY
Glucose-Capillary: 140 mg/dL — ABNORMAL HIGH (ref 70–99)
Glucose-Capillary: 127 mg/dL — ABNORMAL HIGH (ref 70–99)
Glucose-Capillary: 227 mg/dL — ABNORMAL HIGH (ref 70–99)
Glucose-Capillary: 162 mg/dL — ABNORMAL HIGH (ref 70–99)
Glucose-Capillary: 171 mg/dL — ABNORMAL HIGH (ref 70–99)
Glucose-Capillary: 112 mg/dL — ABNORMAL HIGH (ref 70–99)

## 2022-03-07 LAB — TRIGLYCERIDES, BODY FLUIDS: Triglycerides, Fluid: 841 mg/dL

## 2022-03-07 LAB — RAD ONC ARIA SESSION SUMMARY
Course Elapsed Days: 8
Plan Fractions Treated to Date: 1
Plan Prescribed Dose Per Fraction: 2.5 Gy
Plan Total Fractions Prescribed: 5
Plan Total Prescribed Dose: 12.5 Gy
Reference Point Dosage Given to Date: 15 Gy
Reference Point Session Dosage Given: 2.5 Gy
Session Number: 6

## 2022-03-07 LAB — BASIC METABOLIC PANEL
Anion gap: 5 (ref 5–15)
BUN: 41 mg/dL — ABNORMAL HIGH (ref 8–23)
CO2: 25 mmol/L (ref 22–32)
Calcium: 8.2 mg/dL — ABNORMAL LOW (ref 8.9–10.3)
Chloride: 108 mmol/L (ref 98–111)
Creatinine, Ser: 0.45 mg/dL (ref 0.44–1.00)
GFR, Estimated: >60 mL/min (ref >60)
Glucose, Bld: 94 mg/dL (ref 70–99)
Potassium: 4.8 mmol/L (ref 3.5–5.1)
Sodium: 138 mmol/L (ref 135–145)

## 2022-03-07 LAB — CBC
HCT: 35.9 % — ABNORMAL LOW (ref 36.0–46.0)
Hemoglobin: 11 g/dL — ABNORMAL LOW (ref 12.0–15.0)
MCH: 28.9 pg (ref 26.0–34.0)
MCHC: 30.6 g/dL (ref 30.0–36.0)
MCV: 94.5 fL (ref 80.0–100.0)
Platelets: 206 10*3/uL (ref 150–400)
RBC: 3.8 MIL/uL — ABNORMAL LOW (ref 3.87–5.11)
RDW: 19 % — ABNORMAL HIGH (ref 11.5–15.5)
WBC: 61.6 10*3/uL (ref 4.0–10.5)
nRBC: 0.1 % (ref 0.0–0.2)

## 2022-03-07 LAB — BODY FLUID CULTURE W GRAM STAIN
Culture: NO GROWTH
Culture: NO GROWTH
Gram Stain: NONE SEEN

## 2022-03-07 MED ORDER — SODIUM CHLORIDE 0.45 % IV BOLUS: 500.0000 mL | Freq: Once | INTRAVENOUS | Status: DC

## 2022-03-07 MED ORDER — CHLORHEXIDINE GLUCONATE 0.12 % MT SOLN
OROMUCOSAL | Status: AC
  Administered 2022-03-07: 15 mL via OROMUCOSAL
  Filled 2022-03-07: qty 15

## 2022-03-07 MED ORDER — TRAVASOL 10 % IV SOLN: INTRAVENOUS | Status: DC

## 2022-03-07 MED ORDER — TRAVASOL 10 % IV SOLN
INTRAVENOUS | Status: AC
  Filled 2022-03-07: qty 1500

## 2022-03-07 NOTE — Progress Notes (Addendum)
Inpatient Rehabilitation Admissions Coordinator   Rehab consult received. Therapy recommending SNF level rehab. Per review of chart, noted there are concerns for her ability to care for her Pleurx at home alone as well as ongoing care needs.  CLL/SLL and receiving Radiation. CIR admit will not change the need for caregiver supports at home.  Long term care Nursing home discussion began 6/12 with SW I will not pursue CIR admit at this time. Please call me with any questions. TOC RN CM, Rhonda, notified of recommendation.  Ottie Glazier, RN, MSN Rehab Admissions Coordinator [phone] 03/07/2022 1:50 PM

## 2022-03-08 DIAGNOSIS — J9601 Acute respiratory failure with hypoxia: Secondary | ICD-10-CM | POA: Diagnosis not present

## 2022-03-08 DIAGNOSIS — Z4682 Encounter for fitting and adjustment of non-vascular catheter: Secondary | ICD-10-CM | POA: Diagnosis not present

## 2022-03-08 DIAGNOSIS — J940 Chylous effusion: Secondary | ICD-10-CM | POA: Diagnosis not present

## 2022-03-08 LAB — BASIC METABOLIC PANEL
Anion gap: 5 (ref 5–15)
BUN: 39 mg/dL — ABNORMAL HIGH (ref 8–23)
CO2: 25 mmol/L (ref 22–32)
Calcium: 8 mg/dL — ABNORMAL LOW (ref 8.9–10.3)
Chloride: 109 mmol/L (ref 98–111)
Creatinine, Ser: 0.47 mg/dL (ref 0.44–1.00)
GFR, Estimated: >60 mL/min (ref >60)
Glucose, Bld: 109 mg/dL — ABNORMAL HIGH (ref 70–99)
Potassium: 4.2 mmol/L (ref 3.5–5.1)
Sodium: 139 mmol/L (ref 135–145)

## 2022-03-08 LAB — GLUCOSE, CAPILLARY
Glucose-Capillary: 110 mg/dL — ABNORMAL HIGH (ref 70–99)
Glucose-Capillary: 142 mg/dL — ABNORMAL HIGH (ref 70–99)
Glucose-Capillary: 178 mg/dL — ABNORMAL HIGH (ref 70–99)
Glucose-Capillary: 185 mg/dL — ABNORMAL HIGH (ref 70–99)
Glucose-Capillary: 191 mg/dL — ABNORMAL HIGH (ref 70–99)
Glucose-Capillary: 220 mg/dL — ABNORMAL HIGH (ref 70–99)
Glucose-Capillary: 246 mg/dL — ABNORMAL HIGH (ref 70–99)

## 2022-03-08 LAB — PHOSPHORUS: Phosphorus: 3.4 mg/dL (ref 2.5–4.6)

## 2022-03-08 LAB — MAGNESIUM: Magnesium: 2.1 mg/dL (ref 1.7–2.4)

## 2022-03-08 MED ORDER — ORAL CARE MOUTH RINSE: 15.0000 mL | OROMUCOSAL | Status: DC | PRN

## 2022-03-08 MED ORDER — TRAVASOL 10 % IV SOLN
INTRAVENOUS | Status: AC
  Filled 2022-03-08: qty 1500

## 2022-03-09 ENCOUNTER — Inpatient Hospital Stay (HOSPITAL_COMMUNITY): Payer: Medicaid Other

## 2022-03-09 DIAGNOSIS — Z4682 Encounter for fitting and adjustment of non-vascular catheter: Secondary | ICD-10-CM | POA: Diagnosis not present

## 2022-03-09 DIAGNOSIS — J940 Chylous effusion: Secondary | ICD-10-CM | POA: Diagnosis not present

## 2022-03-09 DIAGNOSIS — J9601 Acute respiratory failure with hypoxia: Secondary | ICD-10-CM | POA: Diagnosis not present

## 2022-03-09 LAB — GLUCOSE, CAPILLARY
Glucose-Capillary: 171 mg/dL — ABNORMAL HIGH (ref 70–99)
Glucose-Capillary: 120 mg/dL — ABNORMAL HIGH (ref 70–99)
Glucose-Capillary: 336 mg/dL — ABNORMAL HIGH (ref 70–99)
Glucose-Capillary: 327 mg/dL — ABNORMAL HIGH (ref 70–99)
Glucose-Capillary: 171 mg/dL — ABNORMAL HIGH (ref 70–99)
Glucose-Capillary: 288 mg/dL — ABNORMAL HIGH (ref 70–99)

## 2022-03-09 LAB — BASIC METABOLIC PANEL
Anion gap: 6 (ref 5–15)
BUN: 39 mg/dL — ABNORMAL HIGH (ref 8–23)
CO2: 22 mmol/L (ref 22–32)
Calcium: 8.1 mg/dL — ABNORMAL LOW (ref 8.9–10.3)
Chloride: 112 mmol/L — ABNORMAL HIGH (ref 98–111)
Creatinine, Ser: 0.44 mg/dL (ref 0.44–1.00)
GFR, Estimated: >60 mL/min (ref >60)
Glucose, Bld: 81 mg/dL (ref 70–99)
Potassium: 3.6 mmol/L (ref 3.5–5.1)
Sodium: 140 mmol/L (ref 135–145)

## 2022-03-09 LAB — CBC
HCT: 33.9 % — ABNORMAL LOW (ref 36.0–46.0)
Hemoglobin: 11 g/dL — ABNORMAL LOW (ref 12.0–15.0)
MCH: 30.5 pg (ref 26.0–34.0)
MCHC: 32.4 g/dL (ref 30.0–36.0)
MCV: 93.9 fL (ref 80.0–100.0)
Platelets: 215 10*3/uL (ref 150–400)
RBC: 3.61 MIL/uL — ABNORMAL LOW (ref 3.87–5.11)
RDW: 19.9 % — ABNORMAL HIGH (ref 11.5–15.5)
WBC: 54.2 10*3/uL (ref 4.0–10.5)
nRBC: 0.1 % (ref 0.0–0.2)

## 2022-03-09 LAB — CHOLESTEROL, BODY FLUID: Cholesterol, Fluid: 45 mg/dL

## 2022-03-09 LAB — PHOSPHORUS: Phosphorus: 3 mg/dL (ref 2.5–4.6)

## 2022-03-09 LAB — MAGNESIUM: Magnesium: 1.9 mg/dL (ref 1.7–2.4)

## 2022-03-09 MED ORDER — TRAVASOL 10 % IV SOLN
INTRAVENOUS | Status: AC
  Filled 2022-03-09: qty 1500

## 2022-03-09 MED ORDER — POTASSIUM CHLORIDE 10 MEQ/50ML IV SOLN
10.0000 meq | INTRAVENOUS | Status: AC
  Administered 2022-03-09 (×2): 10 meq via INTRAVENOUS
  Filled 2022-03-09 (×2): qty 50

## 2022-03-09 NOTE — Progress Notes (Signed)
Informed Dr. Wynona Neat about the patient's consistent saturation of left chest tube dressings (dressing changes x3 in 12 hours). Chest tube still has suction at -20 without a known leak and continues to have milky output. Request Dr. Wynona Neat to look at the tube/if appropriate, CXR for placement verification.

## 2022-03-09 NOTE — Progress Notes (Signed)
NAME:  [name], MRN:  [id number], DOB:  [birth year], LOS: 23 ADMISSION DATE:  02/14/2022, CONSULTATION DATE:  6/2 REFERRING MD:  Dr. Rubin Payor, CHIEF COMPLAINT:  Unresponsive; Acute resp failure   BRIEF  Patient is a 62 year old female with pertinent PMH of CLL, depression, anxiety, HLD, HTN presents to [facility] ED on 6/2 unresponsive.  On 6/2 patient called EMS for abdominal pain.  Patient initially was alert and talking but became unresponsive with AMS.  Patient was hypoxic with agonal breathing and was placed on NRB.  Patient was noted to be hypotensive.  Patient transported to [facility] ED.  Upon arrival to [facility] ED patient remained unresponsive with agonal respirations.  Patient intubated for airway protection and hypoxia.  BP initially 81/65.  Given IV fluids.  Started on levo.  Fever 100.9 F and WBC 104.4.  Started on cefepime, Flagyl, Vanco.  Cultures pending.  Glucose 198, BNP 534, troponin 109 then 112, LA 4.4 then 1.8, UA unremarkable, UDS negative.  CXR with large left pleural effusion and small right pleural effusion.  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  CT abdomen with ascending colitis constipation, small intestine wall thickening possibly related to gastroenteritis; small volume ascites; extensive abdominopelvic and inguinal adenopathy.   PCCM was consulted for bilateral pleural effusion  Pertinent  Medical History    has a past medical history of Anxiety, Chronic abdominal pain, Chronic lymphocytic leukemia (CLL), B-cell (HCC) (04/04/2016), CLL (chronic lymphocytic leukemia) (HCC), Depression with anxiety (07/07/2015), Family history of hyperlipidemia (12/14/2019), H. 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   has a past surgical history that includes Surgery of left middle finger on left hand (childhood ); Tubal ligation; Cholecystectomy; Colonoscopy (02/07/2011); Hammer toe surgery (Left); Axillary lymph node biopsy (Right, 03/28/2016); Hysteroscopy with D & C (N/A, 07/30/2016); Thoracentesis (Left, 03-21-2022); and Chest tube insertion (N/A, 02/24/2022).  Significant Hospital Events: Including procedures, antibiotic start and stop dates in addition to other pertinent events   6/2 Presented [facility] ER unresponsive, intubated, shock on pressors > to [facility].  CT chest/abd w/ extensive LAN 6/3 Right chest tube placed. Left thora 2000 ml removed - milky 6/4 Extubated, off pressors.  Pleural fluid exudative, cx negative, lymphocytic. 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 Started on pulse dose steroids 50mg  pred.  6/7 Chest tube output over a liter 6/8 Left thoracentesis for 1400 cc of fluid 6/10 Chest tube removed 6/12 S/p right chest tube placement with 3.1 L drained 6/3 Tx from [facility] to [facility] 6/13 for initiation of palliative radiotherapy 6/14 No chest pain. Saturation 96% on 2 L. Chest tube drained 190 cc of milky fluid 6/15 Fell. CXR stable. Doing well. Got SIM for XRT.  RA.  Mod-large effusion on L. Refused CT. Octreotide. 6/16 TPN.  On  O2 . R CT 1000 cc out.  Started XRT 6/15, rituximab is being considered 6/17 CT issues with placement. Not sure if she wants new chest tube.  6/18 Dislodged right chest tube completely.  6/19 PleurX deferred due to poor candidate outpatient. Bilateral chest tubes placed due to worsening respiratory distress.  6/21 2.8L out from CT in last 24 hours. IR consulted for thoracic duct embolization. 6/24-continues with significant output from chest tubes  Interim History / Subjective:  Afebrile  Decreased chest tube output  Objective   Blood pressure 126/83, pulse (!) 107, temperature 98.1 F (36.7 C), temperature source  Oral, resp. rate (!) 28, height 5\' 5"  (1.651 m), weight 49 kg, last menstrual period 07/25/2016, SpO2 99 %.        Intake/Output Summary (Last 24 hours) at 03/09/2022 0946 Last data filed at 03/09/2022 0600 Gross per 24 hour  Intake 2528.41 ml  Output 1070 ml  Net 1458.41 ml   Filed Weights   03/07/22 0500 03/08/22 0500 03/09/22 0640  Weight: 48.6 kg 47.5 kg 49 kg   Physical Exam:  General: Elderly, frail HEENT: Moist oral mucosa Neuro: Alert and oriented x3 CV: S1-S2 appreciated PULM: Good bilateral air entry GI: soft, bsx4 active  Extremities: warm/dry, no edema, LUE PICC  Skin: no rashes or lesions  CXR 6/23 reviewed by myself showing bilateral chest tubes in place, apical pneumothoraces  I did examine chest tube sites There is no active oozing around the chest tube but dressings soaked on the left side  Assessment & Plan:   CLL/SLL with bulky mediastinal adenopathy Thoracentesis on 02/15/2022 showed atypical lymphocytes suspicious for lymphoproliferative process.  Right axillary node was biopsied on 02/18/2022 showing B cells consistent with history of lymphoma and CLL -Ongoing plans per oncology/radiation oncology -On prednisone 50 daily -Palliative care involvement appreciated  Interventional radiology consulted for possible lymph angiogram and thoracic duct embolization  Acute hypoxemic respiratory failure secondary to bilateral pleural effusions Bilateral chylothorax -Continue chest tube to suction, currently on suction -20 -Continue TPN, octreotide in an attempt to slow down cough production -Continue with bilateral chest tubes  Severe Protein Calorie Malnutrition  -Continue TPN per pharmacy, diet as tolerated  Chronic HFpEF due to Uncontrolled Hypertension -per TRH   Hypotension  In setting of chronic disease, profound malnutrition  -continue midodrine    Best Practice:  Per TR[facility]     Virl Diamond, MD [place] [facility] Pager: See Loretha Stapler

## 2022-03-10 ENCOUNTER — Other Ambulatory Visit: Payer: Self-pay

## 2022-03-10 ENCOUNTER — Ambulatory Visit
Admit: 2022-03-10 | Discharge: 2022-03-10 | Disposition: A | Discharge status: Home / Self Care | Payer: Medicaid Other | Attending: Radiation Oncology | Admitting: Radiation Oncology

## 2022-03-10 DIAGNOSIS — F419 Anxiety disorder, unspecified: Secondary | ICD-10-CM | POA: Diagnosis not present

## 2022-03-10 DIAGNOSIS — Z4682 Encounter for fitting and adjustment of non-vascular catheter: Secondary | ICD-10-CM | POA: Diagnosis not present

## 2022-03-10 DIAGNOSIS — J940 Chylous effusion: Secondary | ICD-10-CM | POA: Diagnosis not present

## 2022-03-10 DIAGNOSIS — J9601 Acute respiratory failure with hypoxia: Secondary | ICD-10-CM | POA: Diagnosis not present

## 2022-03-10 LAB — RAD ONC ARIA SESSION SUMMARY
Course Elapsed Days: 11
Plan Fractions Treated to Date: 2
Plan Prescribed Dose Per Fraction: 2.5 Gy
Plan Total Fractions Prescribed: 5
Plan Total Prescribed Dose: 12.5 Gy
Reference Point Dosage Given to Date: 17.5 Gy
Reference Point Session Dosage Given: 2.5 Gy
Session Number: 7

## 2022-03-10 LAB — COMPREHENSIVE METABOLIC PANEL
ALT: 36 U/L (ref 0–44)
AST: 26 U/L (ref 15–41)
Albumin: 2.1 g/dL — ABNORMAL LOW (ref 3.5–5.0)
Alkaline Phosphatase: 65 U/L (ref 38–126)
Anion gap: 4 — ABNORMAL LOW (ref 5–15)
BUN: 41 mg/dL — ABNORMAL HIGH (ref 8–23)
CO2: 25 mmol/L (ref 22–32)
Calcium: 7.9 mg/dL — ABNORMAL LOW (ref 8.9–10.3)
Chloride: 113 mmol/L — ABNORMAL HIGH (ref 98–111)
Creatinine, Ser: 0.46 mg/dL (ref 0.44–1.00)
GFR, Estimated: >60 mL/min (ref >60)
Glucose, Bld: 144 mg/dL — ABNORMAL HIGH (ref 70–99)
Potassium: 3.9 mmol/L (ref 3.5–5.1)
Sodium: 142 mmol/L (ref 135–145)
Total Bilirubin: 0.3 mg/dL (ref 0.3–1.2)
Total Protein: 4 g/dL — ABNORMAL LOW (ref 6.5–8.1)

## 2022-03-10 LAB — CBC
HCT: 31.7 % — ABNORMAL LOW (ref 36.0–46.0)
Hemoglobin: 9.9 g/dL — ABNORMAL LOW (ref 12.0–15.0)
MCH: 30 pg (ref 26.0–34.0)
MCHC: 31.2 g/dL (ref 30.0–36.0)
MCV: 96.1 fL (ref 80.0–100.0)
Platelets: 180 10*3/uL (ref 150–400)
RBC: 3.3 MIL/uL — ABNORMAL LOW (ref 3.87–5.11)
RDW: 20.3 % — ABNORMAL HIGH (ref 11.5–15.5)
WBC: 44.9 10*3/uL — ABNORMAL HIGH (ref 4.0–10.5)
nRBC: 0.2 % (ref 0.0–0.2)

## 2022-03-10 LAB — GLUCOSE, CAPILLARY
Glucose-Capillary: 147 mg/dL — ABNORMAL HIGH (ref 70–99)
Glucose-Capillary: 223 mg/dL — ABNORMAL HIGH (ref 70–99)
Glucose-Capillary: 256 mg/dL — ABNORMAL HIGH (ref 70–99)
Glucose-Capillary: 359 mg/dL — ABNORMAL HIGH (ref 70–99)
Glucose-Capillary: 75 mg/dL (ref 70–99)
Glucose-Capillary: 98 mg/dL (ref 70–99)

## 2022-03-10 LAB — PHOSPHORUS: Phosphorus: 3 mg/dL (ref 2.5–4.6)

## 2022-03-10 LAB — MAGNESIUM: Magnesium: 2 mg/dL (ref 1.7–2.4)

## 2022-03-10 LAB — TRIGLYCERIDES: Triglycerides: 59 mg/dL (ref <150)

## 2022-03-10 MED ORDER — SIROLIMUS 1 MG PO TABS
2.0000 mg | ORAL_TABLET | Freq: Every day | ORAL | Status: DC
  Administered 2022-03-10 – 2022-03-25 (×15): 2 mg via ORAL
  Filled 2022-03-10 (×17): qty 2

## 2022-03-10 MED ORDER — TRAVASOL 10 % IV SOLN
INTRAVENOUS | Status: AC
  Filled 2022-03-10: qty 1500

## 2022-03-10 NOTE — Progress Notes (Addendum)
   Call from Richarda Overlie  - Thoracic Duct embolization - will be under [place]. Lymphangiogram undero fluro needed (MRI not required) and t hen embolization with glue needed. Typically IR has done it under injury but local experience with mediastinal adenopathy with cancer is not significant to non-existent Antipated duration  of procedure is 2-3  hours  Can potentially help to reduce morbidity  IR willing to do it but caveats are  A) need to know prognsis and goals of care B) success rate in cancer can be lower C) IR willing to do it if needed  Plan  - cancel MRI  -  Options: - Chemo with +/- Pleurx +/- Sirolimus (OR) if prngosis is "dencent" then consider thoracic duct embolization - will need to discuss with Onc   Xxx Update: d/w Dr Leonides Schanz 3:50 PM -  CLL is good prognosis long term Currently she is on pulsed steroid. Only. She is not immunotherapy (As yet) .  She is currently getting XRT - but so far it has not worked.He does not see contraindication to Sirolimus. Didsadv for pleurx: infection  SIGNATURE    Dr. Kalman Shan, M.D., F.C.C.P,  Pulmonary and Critical Care Medicine Staff Physician, [facility] Center Director - Interstitial Lung Disease  Program  Medical Director - [facility] Pulmonary Fibrosis [facility] at [place], [place], [address]  NPI Number:  NPI #[id number] [facility] Number: [id number]  Pager: [phone], If no answer  -> Check AMION or Try [phone] Telephone (clinical office): [phone] Telephone (research): [phone]  2:06 PM 03/10/2022

## 2022-03-10 NOTE — Plan of Care (Signed)
  Problem: Education: Goal: Ability to describe self-care measures that may prevent or decrease complications (Diabetes Survival Skills Education) will improve Outcome: Progressing   Problem: Coping: Goal: Ability to adjust to condition or change in health will improve Outcome: Progressing   Problem: Fluid Volume: Goal: Ability to maintain a balanced intake and output will improve Outcome: Progressing   Problem: Metabolic: Goal: Ability to maintain appropriate glucose levels will improve Outcome: Progressing   Problem: Tissue Perfusion: Goal: Adequacy of tissue perfusion will improve Outcome: Progressing

## 2022-03-11 ENCOUNTER — Inpatient Hospital Stay (HOSPITAL_COMMUNITY): Payer: Medicaid Other

## 2022-03-11 ENCOUNTER — Other Ambulatory Visit: Payer: Self-pay

## 2022-03-11 ENCOUNTER — Ambulatory Visit
Admit: 2022-03-11 | Discharge: 2022-03-11 | Disposition: A | Discharge status: Home / Self Care | Payer: Medicaid Other | Attending: Radiation Oncology | Admitting: Radiation Oncology

## 2022-03-11 DIAGNOSIS — J940 Chylous effusion: Secondary | ICD-10-CM | POA: Diagnosis not present

## 2022-03-11 DIAGNOSIS — J9601 Acute respiratory failure with hypoxia: Secondary | ICD-10-CM | POA: Diagnosis not present

## 2022-03-11 DIAGNOSIS — Z4682 Encounter for fitting and adjustment of non-vascular catheter: Secondary | ICD-10-CM | POA: Diagnosis not present

## 2022-03-11 LAB — RAD ONC ARIA SESSION SUMMARY
Course Elapsed Days: 12
Plan Fractions Treated to Date: 3
Plan Prescribed Dose Per Fraction: 2.5 Gy
Plan Total Fractions Prescribed: 5
Plan Total Prescribed Dose: 12.5 Gy
Reference Point Dosage Given to Date: 20 Gy
Reference Point Session Dosage Given: 2.5 Gy
Session Number: 8

## 2022-03-11 LAB — BASIC METABOLIC PANEL
Anion gap: 3 — ABNORMAL LOW (ref 5–15)
BUN: 35 mg/dL — ABNORMAL HIGH (ref 8–23)
CO2: 24 mmol/L (ref 22–32)
Calcium: 7.9 mg/dL — ABNORMAL LOW (ref 8.9–10.3)
Chloride: 110 mmol/L (ref 98–111)
Creatinine, Ser: 0.36 mg/dL — ABNORMAL LOW (ref 0.44–1.00)
GFR, Estimated: >60 mL/min (ref >60)
Glucose, Bld: 134 mg/dL — ABNORMAL HIGH (ref 70–99)
Potassium: 5.1 mmol/L (ref 3.5–5.1)
Sodium: 137 mmol/L (ref 135–145)

## 2022-03-11 LAB — GLUCOSE, CAPILLARY
Glucose-Capillary: 100 mg/dL — ABNORMAL HIGH (ref 70–99)
Glucose-Capillary: 110 mg/dL — ABNORMAL HIGH (ref 70–99)
Glucose-Capillary: 111 mg/dL — ABNORMAL HIGH (ref 70–99)
Glucose-Capillary: 128 mg/dL — ABNORMAL HIGH (ref 70–99)
Glucose-Capillary: 140 mg/dL — ABNORMAL HIGH (ref 70–99)
Glucose-Capillary: 148 mg/dL — ABNORMAL HIGH (ref 70–99)
Glucose-Capillary: 203 mg/dL — ABNORMAL HIGH (ref 70–99)

## 2022-03-11 MED ORDER — TRAVASOL 10 % IV SOLN
INTRAVENOUS | Status: AC
  Filled 2022-03-11: qty 1500

## 2022-03-11 MED ORDER — INSULIN GLARGINE-YFGN 100 UNIT/ML ~~LOC~~ SOLN
14.0000 [IU] | Freq: Every day | SUBCUTANEOUS | Status: DC
  Administered 2022-03-12: 14 [IU] via SUBCUTANEOUS
  Filled 2022-03-11 (×2): qty 0.14

## 2022-03-11 MED ORDER — INSULIN GLARGINE-YFGN 100 UNIT/ML ~~LOC~~ SOLN
4.0000 [IU] | Freq: Once | SUBCUTANEOUS | Status: AC
  Administered 2022-03-11: 4 [IU] via SUBCUTANEOUS
  Filled 2022-03-11: qty 0.04

## 2022-03-11 NOTE — Progress Notes (Signed)
OT Cancellation Note  Patient Details Name: [name] MRN: [id number] DOB: May 18, 1961   Cancelled Treatment:    Reason Eval/Treat Not Completed: Patient at procedure or test/ unavailable  Theodoro Clock 03/11/2022, 1:49 PM

## 2022-03-11 NOTE — Progress Notes (Addendum)
NAME:  [name], MRN:  [id number], DOB:  May 02, 1961, LOS: 25 ADMISSION DATE:  Mar 03, 2022, CONSULTATION DATE:  6/2 REFERRING MD:  Dr. Rubin Payor, CHIEF COMPLAINT:  Unresponsive; Acute resp failure   BRIEF  Patient is a 61 year old female with pertinent PMH of CLL, depression, anxiety, HLD, HTN presents to [facility] ED on 6/2 unresponsive.  On 6/2 patient called EMS for abdominal pain.  Patient initially was alert and talking but became unresponsive with AMS.  Patient was hypoxic with agonal breathing and was placed on NRB.  Patient was noted to be hypotensive.  Patient transported to [facility] ED.  Upon arrival to [facility] ED patient remained unresponsive with agonal respirations.  Patient intubated for airway protection and hypoxia.  BP initially 81/65.  Given IV fluids.  Started on levo.  Fever 100.9 F and WBC 104.4.  Started on cefepime, Flagyl, Vanco.  Cultures pending.  Glucose 198, BNP 534, troponin 109 then 112, LA 4.4 then 1.8, UA unremarkable, UDS negative.  CXR with large left pleural effusion and small right pleural effusion.  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  CT abdomen with ascending colitis constipation, small intestine wall thickening possibly related to gastroenteritis; small volume ascites; extensive abdominopelvic and inguinal adenopathy.   PCCM was consulted for bilateral pleural effusion  Pertinent  Medical History    has a past medical history of Anxiety, Chronic abdominal pain, Chronic lymphocytic leukemia (CLL), B-cell (HCC) (04/04/2016), CLL (chronic lymphocytic leukemia) (HCC), Depression with anxiety (07/07/2015), Family history of hyperlipidemia (12/14/2019), H. 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   has a past surgical history that includes Surgery of left middle finger on left hand (childhood ); Tubal ligation; Cholecystectomy; Colonoscopy (02/07/2011); Hammer toe surgery (Left); Axillary lymph node biopsy (Right, 03/28/2016); Hysteroscopy with D & C (N/A, 07/30/2016); Thoracentesis (Left, 02/20/2022); and Chest tube insertion (N/A, 02/24/2022).  Significant Hospital Events: Including procedures, antibiotic start and stop dates in addition to other pertinent events   6/2 Presented [facility] ER unresponsive, intubated, shock on pressors > to [facility].  CT chest/abd w/ extensive LAN 6/3 Right chest tube placed. Left thora 2000 ml removed - milky 6/4 Extubated, off pressors.  Pleural fluid exudative, cx negative, lymphocytic. 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 Started on pulse dose steroids 50mg  pred.  6/7 Chest tube output over a liter 6/8 Left thoracentesis for 1400 cc of fluid 6/10 Chest tube removed 6/12 S/p right chest tube placement with 3.1 L drained 6/3 Tx from [facility] to [facility] 6/13 for initiation of palliative radiotherapy 6/14 No chest pain. Saturation 96% on 2 L. Chest tube drained 190 cc of milky fluid 6/15 Fell. CXR stable. Doing well. Got SIM for XRT.  RA.  Mod-large effusion on L. Refused CT. Octreotide. 6/16 TPN.  On [place] O2 . R CT 1000 cc out.  Started XRT 6/15, rituximab is being considered 6/17 CT issues with placement. Not sure if she wants new chest tube.  6/18 Dislodged right chest tube completely.  6/19 PleurX deferred due to poor candidate outpatient. Bilateral chest tubes placed due to worsening respiratory distress.  6/20 - new onset hypotension after bilateral chest tube and midodrine started 6/21 2.8L out from CT in last 24 hours. IR consulted  for thoracic duct embolization. Per IR: Lymphangiogram with thoracic duct embolization is not a routine procedure and will need to see when this procedure could potentially be  performed 6/24-continues with significant output from chest tubes 6/25 - Afebrile .decreased chest tube output 6/26 - walked with PT in my presene.Bilateral chest tubes with draining chylothorax. IR uncertain (03/05/22) being STARTED SIROLIMUS  Interim History / Subjective:    6/27- Last90 min > 50cc drain of chyle on left sidde. Rt side - hardly any ouput.  Growing consensus that thoracic duct ligation is indicated based on curbside with Dr Cliffton Asters surgeon and also running case by surgeon in [place], Dr Leonides Schanz of oncology. Started on sirolimus yesterday, She is eating, ambulating and also on TPN. No fevers   Objective   Blood pressure 119/86, pulse 97, temperature 98 F (36.7 C), temperature source Oral, resp. rate 20, height 5\' 5"  (1.651 m), weight 51.7 kg, last menstrual period 07/25/2016, SpO2 98 %.        Intake/Output Summary (Last 24 hours) at 03/11/2022 1028 Last data filed at 03/11/2022 0900 Gross per 24 hour  Intake 2492.01 ml  Output 1130 ml  Net 1362.01 ml   Filed Weights   03/09/22 0640 03/10/22 0348 03/11/22 0418  Weight: 49 kg 49.6 kg 51.7 kg   Physical Exam:  General Appearance:  Looks frail., lean, wasting muscle mass Head:  Normocephalic, without obvious abnormality, atraumatic Eyes:  PERRL - yes, conjunctiva/corneas - muddy     Ears:  Normal external ear canals, both ears Nose:  G tube - no Throat:  ETT TUBE - no , OG tube - no Neck:  Supple,  No enlargement/tenderness/nodules Lungs: Clear to auscultation bilaterally, BIALTERAL CHEST TUBES WITH CHYLE Heart:  S1 and S2 normal, no murmur, CVP - no.  Pressors - no Abdomen:  Soft, no masses, no organomegaly Genitalia / Rectal:  Not done Extremities:  Extremities- intact with diffuse wasign Skin:  ntact in exposed areas . Sacral area - not examined Neurologic:  Sedation - none -> RASS - +1 . Moves all 4s - yes. CAM-ICU - neg . Orientation - x3+       Assessment & Plan:   CLL/SLL with  Adenopathy and  associated bilateral chylothorax - Thoracentesis on 02/15/22 showed atypical lymphocytes suspicious for lymphoproliferative process. right axillary node was biopsies on 02/18/22 showing B cells consistent with her history of lymphoma and CLL  - on octreotide since 02/25/22 - on  TPN since 02/25/22 - on prednisone 50mg  per day since 02/18/22 - on sirolimus since 03/10/22  627 - Rt sideced chest tube with minimal drainiange. Left sided chest tube with significant drain.  ? Improved with sirolimums versus chest tube functional issues. Versus inaccurate recordings  PLAN #For Effusions - get cxr + d/w RN about bid markings and recordings - - Cotninue Sirolimus high dose 2mg  per day  - can take weeks/months to act - COntinue Chest tube - Recommended IR guided thoracic duct ligation - explained the limited literature, the general consensus, duration or procedure 4-6h under general -> she is intereested. Also, d.w Dr Faye Ramsay of IR who will evaute  #for CLL -continue prednisone 50 mg QD - Needs immune threapy - per Dr Leonides Schanz (oral convesation 6/26) can be done as opd with Dr Elbert Ewings - Priority is the sto    Hypotension  - In setting of chronic disease, profound malnutrition but onset 6/20 after chest tube  6/26 -BP adequate on midodrine  PlAN -continue midodrine  Best Practice:  Per TR[facility]    > 80 min spent in care coordination   ATTESTATION & SIGNATURE    Dr. Kalman Shan, M.D., [facility].C.P Pulmonary and Critical Care Medicine Medical Director - [facility] ICU Staff Physician, [facility] [facility] [place] Pulmonary and Critical Care Pager: [phone], If no answer or between  15:00h - 7:00h: call [phone]  03/11/2022 10:28 AM

## 2022-03-12 ENCOUNTER — Ambulatory Visit
Admit: 2022-03-12 | Discharge: 2022-03-12 | Disposition: A | Discharge status: Home / Self Care | Payer: Medicaid Other | Attending: Radiation Oncology | Admitting: Radiation Oncology

## 2022-03-12 ENCOUNTER — Ambulatory Visit: Payer: Medicaid Other

## 2022-03-12 ENCOUNTER — Inpatient Hospital Stay (HOSPITAL_COMMUNITY): Payer: Medicaid Other

## 2022-03-12 ENCOUNTER — Other Ambulatory Visit: Payer: Self-pay

## 2022-03-12 DIAGNOSIS — J940 Chylous effusion: Secondary | ICD-10-CM | POA: Diagnosis not present

## 2022-03-12 DIAGNOSIS — J9601 Acute respiratory failure with hypoxia: Secondary | ICD-10-CM | POA: Diagnosis not present

## 2022-03-12 LAB — RAD ONC ARIA SESSION SUMMARY
Course Elapsed Days: 13
Plan Fractions Treated to Date: 4
Plan Prescribed Dose Per Fraction: 2.5 Gy
Plan Total Fractions Prescribed: 5
Plan Total Prescribed Dose: 12.5 Gy
Reference Point Dosage Given to Date: 22.5 Gy
Reference Point Session Dosage Given: 2.5 Gy
Session Number: 9

## 2022-03-12 LAB — CBC
HCT: 35.7 % — ABNORMAL LOW (ref 36.0–46.0)
Hemoglobin: 11.4 g/dL — ABNORMAL LOW (ref 12.0–15.0)
MCH: 29.8 pg (ref 26.0–34.0)
MCHC: 31.9 g/dL (ref 30.0–36.0)
MCV: 93.5 fL (ref 80.0–100.0)
Platelets: 185 10*3/uL (ref 150–400)
RBC: 3.82 MIL/uL — ABNORMAL LOW (ref 3.87–5.11)
RDW: 21.2 % — ABNORMAL HIGH (ref 11.5–15.5)
WBC: 44.9 10*3/uL — ABNORMAL HIGH (ref 4.0–10.5)
nRBC: 0.1 % (ref 0.0–0.2)

## 2022-03-12 LAB — GLUCOSE, CAPILLARY
Glucose-Capillary: 110 mg/dL — ABNORMAL HIGH (ref 70–99)
Glucose-Capillary: 115 mg/dL — ABNORMAL HIGH (ref 70–99)
Glucose-Capillary: 129 mg/dL — ABNORMAL HIGH (ref 70–99)
Glucose-Capillary: 145 mg/dL — ABNORMAL HIGH (ref 70–99)
Glucose-Capillary: 220 mg/dL — ABNORMAL HIGH (ref 70–99)
Glucose-Capillary: 276 mg/dL — ABNORMAL HIGH (ref 70–99)

## 2022-03-12 LAB — BASIC METABOLIC PANEL
Anion gap: 6 (ref 5–15)
BUN: 28 mg/dL — ABNORMAL HIGH (ref 8–23)
CO2: 27 mmol/L (ref 22–32)
Calcium: 8 mg/dL — ABNORMAL LOW (ref 8.9–10.3)
Chloride: 107 mmol/L (ref 98–111)
Creatinine, Ser: 0.32 mg/dL — ABNORMAL LOW (ref 0.44–1.00)
GFR, Estimated: >60 mL/min (ref >60)
Glucose, Bld: 139 mg/dL — ABNORMAL HIGH (ref 70–99)
Potassium: 4.2 mmol/L (ref 3.5–5.1)
Sodium: 140 mmol/L (ref 135–145)

## 2022-03-12 MED ORDER — SODIUM CHLORIDE 0.9 % IV SOLN
2.0000 g | INTRAVENOUS | Status: AC
Start: 2022-03-17 — End: 2022-03-17
  Administered 2022-03-17: 2 g via INTRAVENOUS
  Filled 2022-03-12: qty 2

## 2022-03-12 MED ORDER — TRAVASOL 10 % IV SOLN
INTRAVENOUS | Status: DC
  Filled 2022-03-12: qty 1500

## 2022-03-12 NOTE — Consult Note (Signed)
Chief Complaint: Patient was seen in consultation today for bilateral pleural chylous effusions   Referring Physician(s): Dr. Carlis Abbott   Supervising Physician: Jacqulynn Cadet  Patient Status: [facility] - In-pt  History of Present Illness: [name] is a 61 y.o. female with a medical history significant for anxiety/depression HTN and chronic lymphocytic leukemia. She presented to the [facility] ED 02/15/2022 unresponsive with hypotension, hypoxia and agonal breathing. She was intubated and given supportive care. Imaging obtained showed extensive abdominopelvic and inguinal adenopathy with ascites and bilateral pleural effusions. She was transferred to [facility] for further treatment.    A right chest tube was placed by the critical care team 02/15/22 and a left thoracentesis was performed with removal of 2 L of milky fluid. 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 She was seen in IR 02/18/22 for a lymph node biopsy and pathology was consistent with CLL.   Her hospitalization has been complicated by recurrent chylous effusions, several thoracentesis, chest tube placements (and an accidental removal) and transfer to [facility] for radiation therapy. IR had previously been requested for pleurx placement but this was deferred due to the patient being a poor outpatient candidate. The patient now has bilateral chest tubes with approximately 7 liters of output per day.     Interventional Radiology was initially consulted on 03/05/22 to evaluate patient for a lymphangiogram with possible thoracic duct embolization. At that time chest tube output was averaging 10 L per days. Due to the challenging nature of the procedure and the low likelihood of achieving meaningful benefit in the setting of malignancy, IR elected to hold off on the procedure to allow more time for planning and to also evaluate the patient's response to radiation therapy.    The patient has not responded as favorably as the  medical teams have hoped and the patient continues to have high output from the chest tubes, left > right. After further discussions between members of the healthcare team including Cardiothoracic surgery, Pulmonary/Critical Care and Oncology, IR has re-reviewed the patient's clinical history and is in agreement to proceed. Imaging/clinical history reviewed and procedure approved by Dr. Laurence Ferrari.   Past Medical History:  Diagnosis Date   Anxiety    Chronic abdominal pain    resolved   Chronic lymphocytic leukemia (CLL), B-cell ([place]) 04/04/2016   CLL (chronic lymphocytic leukemia) ([place])    Depression with anxiety 07/07/2015   Family history of hyperlipidemia 12/14/2019   H. pylori infection 2/09   s/p prevpac   HAND PAIN, BILATERAL 01/29/2009   Qualifier: Diagnosis of  By: Moshe Cipro MD, Margaret     Hypertension    Iron deficiency 11/17/2016   Lymphadenopathy 01/17/2016   Medical non-compliance 08/22/2017   Nicotine addiction    Psychosis ([place])    social worker states that patient does not like to talk about this and will stop her meds if you try and discuss this with her.   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  Otherwise normal colon without evidence  of polyps, masses, inflammatory changes, diverticular AVMs   HAMMER TOE SURGERY Left    HYSTEROSCOPY WITH D & C N/A 07/30/2016   Procedure: DILATATION AND CURETTAGE /HYSTEROSCOPY;  Surgeon: Florian Buff, MD;  Location: [facility];  Service: Gynecology;  Laterality: N/A;    Surgery of left middle finger on left hand (childhood )     THORACENTESIS Left 03/12/2022   Procedure: THORACENTESIS;  Surgeon: Laurin Coder, MD;  Location: [facility];  Service: Pulmonary;  Laterality: Left;  enlarging left effusion   TUBAL LIGATION      Allergies: Feraheme [ferumoxytol]  Medications: Prior to Admission medications   Medication Sig Start Date End Date Taking? Authorizing Provider  lisinopril (ZESTRIL) 10 MG tablet TAKE 1 TABLET BY MOUTH ONCE A DAY. Patient not taking: Reported on 02/16/2022 03/20/20   Fayrene Helper, MD  Vitamin D, Ergocalciferol, (DRISDOL) 1.25 MG (50000 UNIT) CAPS capsule Take 1 capsule (50,000 Units total) by mouth every 7 (seven) days. Patient not taking: Reported on 02/16/2022 12/15/19   Perlie Mayo, NP  fluticasone [facility]) 50 MCG/ACT nasal spray Place 1 spray into the nose daily. 07/02/11 11/27/11  Fayrene Helper, MD     Family History  Problem Relation Age of Onset   Cancer Mother    Hypertension Daughter     Social History   Socioeconomic History   Marital status: Single    Spouse name: Not on file   Number of children: 2   Years of education: Not on file   Highest education level: Not on file  Occupational History   Occupation: disabled  Tobacco Use   Smoking status: Light Smoker    Packs/day: 0.00    Years: 16.00    Total pack years: 0.00    Types: Cigarettes   Smokeless tobacco: Never   Tobacco comments:    3-4 per day   Vaping Use   Vaping Use: Never used  Substance and Sexual Activity   Alcohol use: Not Currently    Alcohol/week: 0.0 standard drinks of alcohol    Comment: occ   Drug use: No   Sexual activity: Not Currently    Birth control/protection: Post-menopausal  Other Topics Concern   Not on file  Social History Narrative   Lives alone   Social Determinants of Health   Financial Resource Strain: Low Risk  (03/08/2020)   Overall Financial Resource Strain (CARDIA)    Difficulty of Paying  Living Expenses: Not hard at all  Food Insecurity: Food Insecurity Present (03/08/2020)   Hunger Vital Sign    Worried About Running Out of Food in the Last Year: Sometimes true    Ran Out of Food in the Last Year: Never true  Transportation Needs: No Transportation Needs (03/08/2020)   PRAPARE - Hydrologist (Medical): No    Lack of Transportation (Non-Medical): No  Physical Activity: Insufficiently Active (03/08/2020)   Exercise Vital Sign    Days of Exercise per Week: 4 days    Minutes of Exercise per Session: 20 min  Stress: No Stress Concern Present (03/08/2020)   [place]    Feeling of Stress : Only a little  Social Connections: Moderately Isolated (03/08/2020)   Social Connection and Isolation Panel [NHANES]    Frequency of Communication with Friends and Family: More than three times a week    Frequency of Social Gatherings with Friends and Family: Once a week  Attends Religious Services: More than 4 times per year    Active Member of Clubs or Organizations: No    Attends Archivist Meetings: Never    Marital Status: Never married    Review of Systems: A 12 point ROS discussed and pertinent positives are indicated in the HPI above.  All other systems are negative.  Review of Systems  Constitutional:  Positive for fatigue.  Respiratory:  Positive for cough and shortness of breath.   Cardiovascular:  Negative for chest pain and leg swelling.  Gastrointestinal:  Positive for abdominal distention.  Musculoskeletal:  Positive for arthralgias and myalgias.  Neurological:  Negative for dizziness and headaches.    Vital Signs: BP 129/87 (BP Location: Right Arm)   Pulse (!) 105   Temp 99.2 F (37.3 C) (Oral)   Resp 20   Ht '5\' 5"'$  (1.651 m)   Wt 113 lb 8.6 oz (51.5 kg)   LMP 07/25/2016   SpO2 96%   BMI 18.89 kg/m   Physical Exam Constitutional:      General: She is  not in acute distress.    Appearance: She is ill-appearing.  HENT:     Mouth/Throat:     Mouth: Mucous membranes are moist.     Pharynx: Oropharynx is clear.  Cardiovascular:     Rate and Rhythm: Regular rhythm. Tachycardia present.     Pulses: Normal pulses.     Heart sounds: Normal heart sounds.     Comments: Left upper arm PICC.  Pulmonary:     Effort: Pulmonary effort is normal.     Breath sounds: Normal breath sounds.     Comments: Bilateral chest tubes. Right chest tube with minimal output for several days. Left chest tube with high output and leaking profusely around the site.  Abdominal:     General: Bowel sounds are normal.     Palpations: Abdomen is soft.     Tenderness: There is no abdominal tenderness.  Musculoskeletal:     Right lower leg: No edema.  Skin:    General: Skin is warm and dry.  Neurological:     Mental Status: She is alert and oriented to person, place, and time.     Imaging: DG CHEST PORT 1 VIEW  Result Date: 03/12/2022 CLINICAL DATA:  Pleural effusion EXAM: PORTABLE CHEST 1 VIEW COMPARISON:  Multiple prior chest radiographs including most recent dated March 11, 2022 FINDINGS: The heart size and mediastinal contours are within normal limits. No significant interval change in bilateral pleural effusions with chest tubes in place. Atelectasis of the right middle lobe, unchanged. No appreciable pneumothorax. Left access PICC with distal tip in the right atrium, unchanged. Bilateral glenohumeral osteoarthritis. No acute osseous abnormality. IMPRESSION: 1. Bilateral chest tubes in place with unchanged appearance of bilateral pleural effusions and right midlung atelectasis. No appreciable pneumothorax. 2.  Stable cardiomediastinal silhouette. Electronically Signed   By: Keane Police D.O.   On: 03/12/2022 12:04   DG CHEST PORT 1 VIEW  Result Date: 03/11/2022 CLINICAL DATA:  Pleural effusion.  History of hypertension. EXAM: PORTABLE CHEST 1 VIEW COMPARISON:   03/09/2022 and CT chest on 02/22/2022 FINDINGS: Patient has bilateral pleural catheters overlying the lung bases. LEFT-sided PICC line tip overlies the RIGHT atrium. Heart size is normal. There is increased opacification at the LEFT lung base, consistent with pleural effusion and atelectasis or infiltrates. Possible small residual LEFT apical pneumothorax. No definite RIGHT pneumothorax identified. There is new opacity at the RIGHT lung base, likely  representing fluid within the minor fissure. IMPRESSION: 1. Increased opacification of the LEFT lung base. 2. Increased fluid in the minor fissure. 3. Possible persistent LEFT apical pneumothorax. Electronically Signed   By: Nolon Nations M.D.   On: 03/11/2022 11:19   DG Chest Port 1 View  Result Date: 03/09/2022 CLINICAL DATA:  Evaluate chest tube placement EXAM: PORTABLE CHEST 1 VIEW COMPARISON:  03/07/2022. FINDINGS: Bilateral chest tubes remain in place. Decrease in volume of scratch set the right scratch set small right apical pneumothorax measures 7 mm over the right apex compared with 9 mm previously. The tiny left apical pneumothorax measures 2 mm over the left apex versus 3 mm previously. There is a left arm PICC line with tip in the cavoatrial junction. Bilateral pleural effusions appear mildly increased in volume from previous exam with progressive perifissural thickening within the right midlung. IMPRESSION: 1. Continued decrease and volume of tiny bilateral pneumothoraces. 2. Increase in bilateral pleural effusions. Electronically Signed   By: Kerby Moors M.D.   On: 03/09/2022 12:02   DG CHEST PORT 1 VIEW  Result Date: 03/07/2022 CLINICAL DATA:  Respiratory failure EXAM: PORTABLE CHEST 1 VIEW COMPARISON:  03/06/2022 and prior studies FINDINGS: The cardiomediastinal silhouette is unremarkable. Bilateral thoracostomy tubes and LEFT PICC line with tip overlying the SUPERIOR cavoatrial junction again noted. Very small RIGHT apical pneumothorax and  miniscule LEFT apical pneumothorax have both slightly decreased. Mild bibasilar atelectasis again noted. Surgical clips overlying the RIGHT axilla again noted. IMPRESSION: Slightly decreased very small bilateral pneumothoraces, otherwise unchanged appearance of the chest. Electronically Signed   By: Margarette Canada M.D.   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 The cardiomediastinal silhouette is unchanged. There are bibasilar chest tubes in place with decreased pleural effusions and mid to lower lung airspace opacities in comparison to prior exam. There are small biapical pneumothoraces post slightly decreased from prior exam. Unchanged mild interstitial prominence. Bones are unchanged. IMPRESSION: Decreased pleural effusions and adjacent atelectasis with stable position of bibasilar chest tubes. Small biapical pneumothoraces, both slightly decreased in size in comparison to prior. Electronically Signed   By: Maurine Simmering M.D.   On: 03/06/2022 07:37   DG CHEST PORT 1 VIEW  Result Date: 03/05/2022 CLINICAL DATA:  Pleural effusion EXAM: PORTABLE CHEST 1 VIEW COMPARISON:  Chest x-ray dated March 04, 2022 FINDINGS: Cardiac and mediastinal contours are unchanged. Unchanged position of left arm PICC. Small right pleural effusion. Stable small right pleural effusion with new right apical pneumothorax. Bilateral chest tubes in place. Lower lung predominant heterogeneous opacities, likely due to atelectasis. IMPRESSION: 1. Stable small right pleural effusion with new right apical pneumothorax. 2. Small left pleural effusion is decreased in size when compared with prior exam, possible new left apical pneumothorax versus skin fold artifact. Recommend  attention on follow-up. Electronically Signed   By: Yetta Glassman M.D.   On: 03/05/2022 08:25   DG CHEST PORT 1 VIEW  Result Date: 03/04/2022 CLINICAL DATA:  Provided history: Pleural effusion. EXAM: PORTABLE CHEST 1 VIEW COMPARISON:  Prior chest radiographs 03/03/2022 and earlier. FINDINGS: Unchanged position of a left-sided PICC with tip projecting at the level of the lower SVC. Redemonstrated bibasilar chest tubes. The cardiac silhouette is unchanged. Aortic atherosclerosis. Interval  decrease in size of a right pleural effusion, now trace to small. Associated mild right basilar atelectasis, improved. Persistent moderate left pleural effusion with associated atelectasis and/or consolidation within the left mid to lower lung field. No evidence of pneumothorax. IMPRESSION: Left-sided PICC and bilateral chest tubes, unchanged in position. Interval decrease in size of a right pleural effusion, now trace to small. Associated mild right basilar atelectasis, improved. Persistent moderate left pleural effusion with associated atelectasis and/or consolidation within the left mid to lower lung field. Electronically Signed   By: Kellie Simmering D.O.   On: 03/04/2022 08:58   DG CHEST PORT 1 VIEW  Result Date: 03/03/2022 CLINICAL DATA:  Chest tube placement. EXAM: PORTABLE CHEST 1 VIEW COMPARISON:  March 03, 2022 (5:33 a.m.) FINDINGS: There is stable left-sided PICC line positioning. Since the prior study, there is been interval bilateral chest tube placement. The distal ends of both chest tubes are seen overlying the medial aspects of the bilateral lung bases, respectively. Persistent bibasilar airspace disease is seen, left greater than right. Bilateral moderate-sized pleural effusions are noted. This is increased in severity on the right. No pneumothorax is identified. The cardiac silhouette is unchanged in size and appearance. Moderate severity calcification of the aortic arch is seen. Radiopaque surgical clips are  noted within the right upper quadrant. The visualized skeletal structures are unremarkable. IMPRESSION: 1. Interval bilateral chest tube placement, as described above, with persistent bibasilar airspace disease, left greater than right. 2. Bilateral moderate-sized pleural effusions, increased in severity on the right. Electronically Signed   By: Virgina Norfolk M.D.   On: 03/03/2022 18:08   DG CHEST PORT 1 VIEW  Result Date: 03/03/2022 CLINICAL DATA:  Follow-up pleural effusion. EXAM: PORTABLE CHEST 1 VIEW COMPARISON:  Chest radiograph March 02, 2022 at 1145 hours FINDINGS: The heart size and mediastinal contours are largely obscured but appear unchanged. Aortic atherosclerosis. Similar size moderate left pleural effusion and left lung base atelectasis/infiltrate. Stable small right pleural effusion with interval progression of the right lung base opacities including a peripheral or nodular focus of opacification in the right lateral lung base. The visualized skeletal structures are unchanged. IMPRESSION: Similar moderate left pleural effusion with adjacent atelectasis/infiltrate. Stable small right pleural effusion with interval progression of the opacities in the right lung base. Electronically Signed   By: Dahlia Bailiff M.D.   On: 03/03/2022 08:14   DG CHEST PORT 1 VIEW  Result Date: 03/02/2022 CLINICAL DATA:  Follow-up pleural effusion. EXAM: PORTABLE CHEST 1 VIEW COMPARISON:  Chest radiograph dated 03/02/2022. FINDINGS: Left-sided PICC in similar position. Similar appearance of moderate size left pleural effusion and left lung base atelectasis or infiltrate. Interval progression of right lung base opacities. No pneumothorax. Atherosclerotic calcification of the aorta. No acute osseous pathology. IMPRESSION: 1. Similar appearance of moderate size left pleural effusion and left lung base atelectasis or infiltrate. 2. Interval progression of right lung base opacities. Electronically Signed   By: Anner Crete M.D.   On: 03/02/2022 23:58   DG CHEST PORT 1 VIEW  Result Date: 03/02/2022 CLINICAL DATA:  Right chest tube fell out today. Follow-up large left pleural effusion. Evaluate for pneumothorax. EXAM: PORTABLE CHEST 1 VIEW COMPARISON:  Portable chest yesterday at 4:52 p.m. FINDINGS: 6:54 a.m., 03/02/2022. Left PICC remains in place with tip at the superior cavoatrial junction. Right chest tube is no longer seen. There is no visible pneumothorax. A large left pleural effusion again obscures the lower 2/3 of the left chest and is unchanged.  Small to moderate right pleural effusion and overlying hazy interstitial consolidation are also similar. The remaining right lung is clear. Heart size is stable , central vessels are slightly prominent without edema. Aortic atherosclerosis. Osteopenia and degenerative change both shoulders. IMPRESSION: Overall aeration is unchanged. A large left pleural effusion small to moderate right pleural effusion and overlying consolidation appear similar. No visible pneumothorax with right chest tube no longer seen. Electronically Signed   By: Telford Nab M.D.   On: 03/02/2022 07:34   DG Chest Port 1 View  Result Date: 03/01/2022 CLINICAL DATA:  Shortness of breath. EXAM: PORTABLE CHEST 1 VIEW COMPARISON:  02/28/2022 FINDINGS: No significant change in a large left pleural effusion. Increased patchy density in the adjacent left lung. Interval patchy density in the right lower lung zone. Slight increase in size of a small right pleural effusion. Left PICC tip in the region of the superior cavoatrial junction. A pill right basilar chest tube is again noted. No pneumothorax. Grossly normal sized heart. Partially calcified thoracic aorta. Mild right and minimal left shoulder degenerative changes. IMPRESSION: 1. Interval patchy atelectasis or pneumonia in the right lower lung zone. 2. Increased patchy atelectasis or pneumonia in the aerated portion of the left lung inferiorly. 3.  Stable large left pleural effusion and slight increase in size of a small right pleural effusion. Electronically Signed   By: Claudie Revering M.D.   On: 03/01/2022 17:10   DG Chest Port 1 View  Result Date: 02/28/2022 CLINICAL DATA:  Pleural effusion EXAM: PORTABLE CHEST 1 VIEW COMPARISON:  Previous studies including the examination done earlier today FINDINGS: There is large left pleural effusion obscuring left mid and left lower lung fields. There is minimal blunting of right lateral CP angle. Right chest tube is noted with its tip in the medial right lower lung fields. Proximal portion of the right pigtail chest tube appears to be coiled possibly in the pleural space or chest wall. This finding has not changed. There is no pneumothorax. Visualized lung fields show no signs of pulmonary edema or focal consolidation. Possible subsegmental atelectasis is seen in the right lower lung fields. Evaluation of left mid and left lower lung fields for atelectasis/pneumonia is limited by the effusion. Tip of PICC line introduced through the left upper extremity is seen in the region of junction of superior vena cava and right atrium. IMPRESSION: Large left pleural effusion has not changed significantly. Small right pleural effusion. Pigtail right chest tube. Proximal portion of the chest tube is coiled, possibly in the pleural space or chest wall. There is no pneumothorax. Electronically Signed   By: Elmer Picker M.D.   On: 02/28/2022 19:54   DG Chest 1 View  Result Date: 02/28/2022 CLINICAL DATA:  Chest crackles.  Encounter for chest tube placement. EXAM: CHEST  1 VIEW COMPARISON:  AP chest 02/27/2022 FINDINGS: New left upper extremity PICC line tip overlies the superior vena cava/right atrial junction. Moderate-to-large left pleural effusion is unchanged. The right lung is well aerated.  No definite right pleural effusion. Right lower thorax pigtail drainage catheter is at a similar height. No pneumothorax is  seen. Right axillary surgical clips. IMPRESSION: One. No significant change in moderate-to-large left pleural effusion. No significant change in position of right basilar pigtail drainage catheter. No significant right pleural effusion. No right pneumothorax is seen. Electronically Signed   By: Yvonne Kendall M.D.   On: 02/28/2022 17:40   DG Lumbar Spine 2-3 Views  Result Date: 02/28/2022 CLINICAL DATA:  Multiple falls. Low back and tailbone pain. EXAM: LUMBAR SPINE - 2-3 VIEW COMPARISON:  Abdominopelvic CT 02/21/2022. FINDINGS: Five lumbar type vertebral bodies. The alignment is normal. No evidence of acute fracture or pars defect. There is multilevel spondylosis with disc space narrowing and endplate osteophyte formation at the lower 3 levels. Mild facet degenerative changes are present. There is aortic atherosclerosis. IMPRESSION: No evidence of acute lumbar spine fracture or malalignment. Mild spondylosis. Electronically Signed   By: Richardean Sale M.D.   On: 02/28/2022 15:22   DG Sacrum/Coccyx  Result Date: 02/28/2022 CLINICAL DATA:  Multiple falls.  Low back and tailbone pain. EXAM: SACRUM AND COCCYX - 2+ VIEW COMPARISON:  Pelvic CT 02/23/2022. FINDINGS: The bones appear mildly demineralized. No evidence of acute fracture or sacroiliac joint diastasis. The symphysis pubis appears normal. Scattered pelvic calcifications, similar to previous CT and likely phleboliths. IMPRESSION: No evidence of acute sacrococcygeal fracture. Electronically Signed   By: Richardean Sale M.D.   On: 02/28/2022 15:20   DG Chest 1 View  Result Date: 02/27/2022 CLINICAL DATA:  Chest crackles.  Chest tube placement. EXAM: CHEST  1 VIEW COMPARISON:  Earlier same day.  02/26/2022. FINDINGS: No visible pleural fluid on the right. No right pneumothorax. Mild atelectasis at the right lung base. Pleural catheter overlies the lower thoracic midline. Large left pleural effusion is unchanged with collapse of the left lower lobe. Skin  fold present on the left. Right arm PICC has been removed. IMPRESSION: Pleural catheter projected over the midline. No visible right effusion or right pneumothorax. Persistent large effusion on the left with left lower lobe collapse. Electronically Signed   By: Nelson Chimes M.D.   On: 02/27/2022 11:48   [place] EKG SITE RITE  Result Date: 02/27/2022 If Site Rite image not attached, placement could not be confirmed due to current cardiac rhythm.  DG Chest Port 1 View  Result Date: 02/27/2022 CLINICAL DATA:  Respiratory failure.  Chest tube. EXAM: PORTABLE CHEST 1 VIEW COMPARISON:  Portable chest yesterday at 5:50 a.m. FINDINGS: 5:07 a.m., 02/27/2022.  Patient is moderately rotated to the left. Pigtail of the pleural catheter is now left of the midline at the level of T12. No pneumothorax is seen. There is only a small right pleural effusion remaining and it appears improved. Right PICC tip remains at the superior cavoatrial junction. A large left pleural effusion again obscures the lower 2/3 of the left chest. There is aortic atherosclerosis and tortuosity. The cardiac size is stable. Remaining lungs clear with COPD change. There are surgical clips in the right axilla. Osteopenia and degenerative change. IMPRESSION: 1. The pigtail of the pleural catheter is now left of the midline in the base of the chest, exact position unclear but there is only minimal right pleural fluid remaining today. 2. Large left pleural effusion obscuring the lower 2/3 of the left chest,, stable. 3. COPD, aortic atherosclerosis. Electronically Signed   By: Telford Nab M.D.   On: 02/27/2022 06:52   DG Chest Port 1 View  Result Date: 02/26/2022 CLINICAL DATA:  Pleural effusions. EXAM: PORTABLE CHEST 1 VIEW COMPARISON:  Chest x-ray 03/02/2022. FINDINGS: The right chest tube has changed position. It is now likely in the posterior inferior right pleural space but difficult to be certain without a lateral film. Small residual right  pleural effusion and right basilar atelectasis. No pneumothorax. Persistent large left pleural effusion with overlying atelectasis. New right-sided PICC line is noted.  The tip is in the right atrium. IMPRESSION:  1. The right chest tube has changed position and is now likely in the posterior inferior right pleural space but difficult to be certain without a lateral film. 2. Persistent large left pleural effusion with overlying atelectasis. 3. New right PICC line with tip in the right atrium. Electronically Signed   By: Marijo Sanes M.D.   On: 02/26/2022 07:58   [place] EKG SITE RITE  Result Date: 02/25/2022 If Site Rite image not attached, placement could not be confirmed due to current cardiac rhythm.  DG Chest Port 1 View  Result Date: 03/08/2022 CLINICAL DATA:  Chest tube placement EXAM: PORTABLE CHEST 1 VIEW COMPARISON:  02/22/2022 FINDINGS: A right pigtail pleural drainage catheter is in place. The pneumothorax component of the right-sided hydropneumothorax is no longer appreciated. Reduced right pleural fluid. There is continued blunting of the right lateral costophrenic angle likely reflecting some residual pleural effusion. There is also a large left pleural effusion encompassing well over half of the left hemithorax. Atherosclerotic calcification of the aortic arch. IMPRESSION: 1. Substantially reduced right pleural fluid, status post pleural pigtail drainage catheter placement. The pneumothorax component of the right hydropneumothorax is no longer seen. 2. Large left pleural effusion. 3.  Aortic Atherosclerosis (ICD10-I70.0). Electronically Signed   By: Van Clines M.D.   On: 02/16/2022 13:48   CT CHEST WO CONTRAST  Result Date: 02/22/2022 CLINICAL DATA:  Pleural effusion. Malignancy suspected. * Tracking Code: BO * EXAM: CT CHEST WITHOUT CONTRAST TECHNIQUE: Multidetector CT imaging of the chest was performed following the standard protocol without IV contrast. RADIATION DOSE REDUCTION: This  exam was performed according to the departmental dose-optimization program which includes automated exposure control, adjustment of the mA and/or kV according to patient size and/or use of iterative reconstruction technique. COMPARISON:  Chest CTA 03/10/2022 FINDINGS: Cardiovascular: The heart size is normal. No substantial pericardial effusion. Coronary artery calcification is evident. Mild atherosclerotic calcification is noted in the wall of the thoracic aorta. Mediastinum/Nodes: Assessment in mediastinum limited by lack of intravenous contrast extensive pleural fluid. Possible 15 mm subcarinal short axis lymph node on 81/3. Imaging appearance suspicious for right hilar lymphadenopathy although again this is not well assessed given lack of intravenous contrast. Bulky axillary lymphadenopathy seen bilaterally. As before, there appears to be bulky adenopathy in the lower neck, poorly visualized given lack of intravenous contrast. Lungs/Pleura: Bilateral large pleural effusions noted with prominent sub pulmonic components. There is dependent collapse/consolidation in the lower lungs. Centrilobular emphsyema noted. Tiny right apical pneumothorax evident. Upper Abdomen: Not well seen. Musculoskeletal: No worrisome lytic or sclerotic osseous abnormality. IMPRESSION: 1. Tiny right apical pneumothorax. 2. Bilateral large pleural effusions with dependent collapse/consolidation in the lower lungs. 3. Bulky axillary and lower cervical lymphadenopathy, poorly visualized given lack of intravenous contrast. Probable mediastinal lymphadenopathy. 4. Aortic Atherosclerosis (ICD10-I70.0). These results will be called to the ordering clinician or representative by the Radiologist Assistant, and communication documented in the PACS or [employer]. Electronically Signed   By: Misty Stanley M.D.   On: 02/22/2022 16:34   DG CHEST PORT 1 VIEW  Result Date: 02/22/2022 CLINICAL DATA:  Pleural effusion. EXAM: PORTABLE CHEST 1 VIEW  COMPARISON:  Chest radiograph 02/21/2022 FINDINGS: The cardiomediastinal silhouette is grossly unchanged, with the heart remaining partially obscured. Aortic atherosclerosis is noted. A small right apical pneumothorax is unchanged. A pleural catheter projects near the inferior aspect of the right lateral costophrenic sulcus, unchanged. Small right and moderate left pleural effusions appear mildly increased, although this may be partly due  to differences in patient positioning. Mild diffuse interstitial opacities and bibasilar airspace opacities have not significantly changed. IMPRESSION: 1. Unchanged small right apical pneumothorax. 2. Mildly increased size of left larger than right pleural effusions. 3. Unchanged interstitial and airspace opacities which may reflect mild edema and atelectasis. Electronically Signed   By: Logan Bores M.D.   On: 02/22/2022 10:21   DG Chest Port 1 View  Result Date: 02/21/2022 CLINICAL DATA:  Pneumothorax EXAM: PORTABLE CHEST 1 VIEW COMPARISON:  03/04/2022 FINDINGS: Unchanged AP portable chest radiograph with a small, less than 10% right apical pneumothorax. Unchanged left greater than right layering bilateral pleural effusions and diffuse bilateral interstitial opacity. Heart and mediastinum are unremarkable. IMPRESSION: 1. Unchanged AP portable chest radiograph with a small, less than 10% right apical pneumothorax. 2. Unchanged left greater than right layering bilateral pleural effusions and diffuse bilateral interstitial opacity, likely edema. Electronically Signed   By: Delanna Ahmadi M.D.   On: 02/21/2022 09:17   DG Chest Port 1 View  Result Date: 03/12/2022 CLINICAL DATA:  Postprocedure. EXAM: PORTABLE CHEST 1 VIEW COMPARISON:  Chest x-ray 02/19/2022.  Chest CT 02/15/2022. FINDINGS: Right pleural drainage catheter is unchanged. There is a small left pleural effusion which has significantly decreased from prior. There is a small right apical pneumothorax measuring 1 cm from  the lung apex. No left-sided pneumothorax visualized. No mediastinal shift. The cardiomediastinal silhouette is within normal limits. The osseous structures are stable. IMPRESSION: 1. Small right apical pneumothorax. Right pleural drainage catheter in place. 2. Small left pleural effusion has decreased from prior. Electronically Signed   By: Ronney Asters M.D.   On: 02/13/2022 16:08   DG CHEST PORT 1 VIEW  Result Date: 02/19/2022 CLINICAL DATA:  Colles thorax.  Chest tube follow-up. EXAM: PORTABLE CHEST 1 VIEW COMPARISON:  02/17/2022 FINDINGS: Thoracostomy tube in the lower lateral right chest remains in place. No pleural air. No visible pleural fluid on the right. Increasing amount of pleural fluid on the left, with complete collapse of the left lower lobe and subtotal collapse of the left upper lobe. Central line has been removed. IMPRESSION: Persistent right pleural catheter. No right pleural air or visible pleural fluid. Enlarging volume of pleural fluid on the left, now with complete collapse of the left lower lobe and subtotal collapse of the left upper lobe. Electronically Signed   By: Nelson Chimes M.D.   On: 02/19/2022 07:25   [place] CORE BIOPSY (LYMPH NODES)  Result Date: 02/18/2022 INDICATION: 61 year old female with history CLL presenting multifocal bilateral axillary masses. EXAM: Ultrasound-guided axillary lymph node biopsy MEDICATIONS: None. ANESTHESIA/SEDATION: None. FLUOROSCOPY TIME:  None. COMPLICATIONS: None immediate. PROCEDURE: Informed written consent was obtained from the patient after a thorough discussion of the procedural risks, benefits and alternatives. All questions were addressed. Maximal Sterile Barrier Technique was utilized including caps, mask, sterile gowns, sterile gloves, sterile drape, hand hygiene and skin antiseptic. A timeout was performed prior to the initiation of the procedure. Preprocedure ultrasound evaluation demonstrated a prominent, subcutaneous heterogeneously  hypoechoic enlarged lymph node measuring approximately 3.3 x 1.9 cm in maximum longitudinal dimensions. The procedure was planned. The right axilla was prepped and draped in standard fashion. Subdermal Local anesthesia was administered with 1% lidocaine at the planned needle entry site. A small skin nick was made. Under direct ultrasound visualization, a 17 gauge introducer needle was directed to the periphery of the lymph node. Next, a total of 4, 18 gauge core biopsies were obtained. Two samples were placed in  saline and 2 samples were placed in formalin. The needle was removed. Postprocedure ultrasound demonstrated no evidence of surrounding hematoma or other complicating features. Hemostasis was achieved with brief manual compression. A sterile bandage was applied. The patient tolerated the procedure well without complication. The patient was transferred back to the floor in stable condition. IMPRESSION: Technically successful ultrasound-guided right axillary lymph node biopsy. Ruthann Cancer, MD [facility] Electronically Signed   By: Ruthann Cancer M.D.   On: 02/18/2022 17:53   DG CHEST PORT 1 VIEW  Result Date: 02/17/2022 CLINICAL DATA:  Pleural effusion EXAM: PORTABLE CHEST 1 VIEW COMPARISON:  February 16, 2022 FINDINGS: The heart size and mediastinal contours are stable. Right central venous line is unchanged. Previously noted endotracheal tube and nasogastric tube have been removed. Patchy consolidation of the left mid and lung base with a left pleural effusion are noted. Stable patchy consolidation of right lung base is noted. The visualized skeletal structures are stable. IMPRESSION: New patchy consolidation of the left mid and lung base with a left pleural effusion, suspicious for pneumonia. Stable patchy consolidation of right lung base. Electronically Signed   By: Abelardo Diesel M.D.   On: 02/17/2022 06:28   DG CHEST PORT 1 VIEW  Result Date:  02/16/2022 CLINICAL DATA:  Follow-up chest tube pleural effusion. EXAM: PORTABLE CHEST 1 VIEW COMPARISON:  Portable chest yesterday at 5:03 p.m. FINDINGS: 5:06 a.m. 02/16/2022. ETT tip 4.0 cm from the carina. Right IJ line tip again in the distal SVC. NGT is well inside the stomach but neither the side-hole or tip are filmed. Basolateral right chest pigtail tube thoracostomy is again noted. There was a minimally small adjacent basolateral pneumothorax on yesterday's film which is not seen today. There is increased hazy consolidation in the right base which could be pneumonia or re-expansion edema. Faint patchy hazy opacities in the left lower lung field are less dense than yesterday with minimal left pleural fluid remaining. No significant right pleural fluid is seen. The mid and upper lungs mildly emphysematous and clear. The mediastinum is stable with aortic tortuosity and atherosclerosis. Osteopenia. IMPRESSION: 1. Chest tube in place with no visible pneumothorax. 2. Other support devices are unaltered. 3. Increased opacity in the right base which could be pneumonitis or re-expansion edema. 4. Improving opacities in the left lower lung field, minimal left pleural effusion. Electronically Signed   By: Telford Nab M.D.   On: 02/16/2022 07:13   DG CHEST PORT 1 VIEW  Result Date: 02/15/2022 CLINICAL DATA:  Chest tube placement. EXAM: PORTABLE CHEST 1 VIEW COMPARISON:  02/15/2021 and prior radiographs FINDINGS: A RIGHT thoracostomy tube is now noted with pigtail tip overlying the LOWER LATERAL RIGHT hemithorax. Near complete resolution of RIGHT pleural effusion noted with tiny RIGHT basilar pneumothorax. New airspace opacities versus atelectasis in the mid and LOWER LEFT lung noted. A trace LEFT pleural effusion is present. RIGHT IJ central venous catheter with tip overlying the mid SVC, endotracheal tube with tip 3.5 cm above the carina and NG tube entering the stomach again noted. IMPRESSION: 1. RIGHT  thoracostomy tube placement with near complete resolution of RIGHT pleural effusion. Tiny RIGHT basilar pneumothorax. 2. New airspace opacities/atelectasis in the mid and LOWER LEFT lung with trace LEFT pleural effusion. Electronically Signed   By: Margarette Canada M.D.   On: 02/15/2022 17:21   DG CHEST PORT 1 VIEW  Result Date: 02/15/2022 CLINICAL DATA:  Acute respiratory failure. Endotracheally intubated. Status post left thoracentesis.  EXAM: PORTABLE CHEST 1 VIEW COMPARISON:  Prior today FINDINGS: Support lines and tubes remain in appropriate position. Small left pleural effusion has nearly completely resolved since previous study. No pneumothorax visualized. Moderate right pleural effusion shows no significant change. Heart size is stable. Diffuse interstitial infiltrates are seen, consistent with interstitial edema. IMPRESSION: Near complete resolution of left pleural effusion. No pneumothorax visualized. Stable moderate right pleural effusion, and diffuse interstitial edema. Electronically Signed   By: Marlaine Hind M.D.   On: 02/15/2022 14:03   ECHOCARDIOGRAM COMPLETE  Result Date: 02/15/2022    ECHOCARDIOGRAM REPORT   Patient Name:   TRINDA HARLACHER Wojtas Date of Exam: 02/15/2022 Medical Rec #:  [id number]   Height:       65.0 in Accession #:    [id number]  Weight:       117.9 lb Date of Birth:  [birth year]   BSA:          1.580 m Patient Age:    46 years    BP:           121/82 mmHg Patient Gender: F           HR:           82 bpm. Exam Location:  Inpatient Procedure: 2D Echo STAT ECHO Indications:    dyspnea  History:        Patient has no prior history of Echocardiogram examinations.                 Sepsis; Risk Factors:Hypertension and Current Smoker.  Sonographer:    [place] Referring Phys: [id number] Hortencia Conradi MEIER IMPRESSIONS  1. Left ventricular ejection fraction, by estimation, is >75%. The left ventricle has hyperdynamic function. The left ventricle has no regional wall motion abnormalities. Left  ventricular diastolic parameters are consistent with Grade II diastolic dysfunction (pseudonormalization). Elevated left atrial pressure.  2. Right ventricular systolic function is mildly reduced. The right ventricular size is normal. Tricuspid regurgitation signal is inadequate for assessing PA pressure.  3. A small pericardial effusion is present. Large pleural effusion in the left lateral region.  4. The mitral valve is normal in structure. No evidence of mitral valve regurgitation.  5. The aortic valve is tricuspid. There is mild thickening of the aortic valve. Aortic valve regurgitation is not visualized. Aortic valve sclerosis is present, with no evidence of aortic valve stenosis.  6. The inferior vena cava is dilated in size with <50% respiratory variability, suggesting right atrial pressure of 15 mmHg. FINDINGS  Left Ventricle: Left ventricular ejection fraction, by estimation, is >75%. The left ventricle has hyperdynamic function. The left ventricle has no regional wall motion abnormalities. The left ventricular internal cavity size was normal in size. There is no left ventricular hypertrophy. Left ventricular diastolic parameters are consistent with Grade II diastolic dysfunction (pseudonormalization). Elevated left atrial pressure. Right Ventricle: The right ventricular size is normal. No increase in right ventricular wall thickness. Right ventricular systolic function is mildly reduced. Tricuspid regurgitation signal is inadequate for assessing PA pressure. Left Atrium: Left atrial size was normal in size. Right Atrium: Right atrial size was normal in size. Pericardium: A small pericardial effusion is present. Mitral Valve: The mitral valve is normal in structure. No evidence of mitral valve regurgitation. Tricuspid Valve: The tricuspid valve is normal in structure. Tricuspid valve regurgitation is not demonstrated. Aortic Valve: The aortic valve is tricuspid. There is mild thickening of the aortic  valve. Aortic valve regurgitation is not visualized. Aortic  valve sclerosis is present, with no evidence of aortic valve stenosis. Pulmonic Valve: The pulmonic valve was normal in structure. Pulmonic valve regurgitation is trivial. Aorta: The aortic root and ascending aorta are structurally normal, with no evidence of dilitation. Venous: The inferior vena cava is dilated in size with less than 50% respiratory variability, suggesting right atrial pressure of 15 mmHg. IAS/Shunts: No atrial level shunt detected by color flow Doppler. Additional Comments: There is a large pleural effusion in the left lateral region.  LEFT VENTRICLE PLAX 2D LVIDd:         3.20 cm   Diastology LVIDs:         1.80 cm   LV e' medial:    5.10 cm/s LV PW:         0.90 cm   LV E/e' medial:  19.2 LV IVS:        0.80 cm   LV e' lateral:   4.80 cm/s LVOT diam:     2.00 cm   LV E/e' lateral: 20.4 LV SV:         63 LV SV Index:   40 LVOT Area:     3.14 cm  RIGHT VENTRICLE            IVC RV S prime:     9.15 cm/s  IVC diam: 2.20 cm TAPSE (M-mode): 1.2 cm LEFT ATRIUM           Index        RIGHT ATRIUM           Index LA diam:      2.00 cm 1.27 cm/m   RA Area:     10.20 cm LA Vol (A4C): 18.7 ml 11.83 ml/m  RA Volume:   21.30 ml  13.48 ml/m  AORTIC VALVE LVOT Vmax:   111.00 cm/s LVOT Vmean:  71.300 cm/s LVOT VTI:    0.200 m  AORTA Ao Root diam: 3.10 cm Ao Asc diam:  2.70 cm MITRAL VALVE MV Area (PHT): 2.80 cm     SHUNTS MV Decel Time: 271 msec     Systemic VTI:  0.20 m MV E velocity: 98.00 cm/s   Systemic Diam: 2.00 cm MV A velocity: 104.00 cm/s MV E/A ratio:  0.94 Mihai Croitoru MD Electronically signed by Sanda Klein MD Signature Date/Time: 02/15/2022/9:36:44 AM    Final    DG CHEST PORT 1 VIEW  Result Date: 02/15/2022 CLINICAL DATA:  61 year old female central line placement. Large pleural effusions. EXAM: PORTABLE CHEST 1 VIEW COMPARISON:  CTA chest yesterday. FINDINGS: Portable AP semi upright view at 0513 hours. Right IJ approach  central line placed. Tip is just below the carina at the lower SVC level. Endotracheal tube tip in good position between the clavicles and carina. Enteric tube courses to the abdomen. No pneumothorax. Left greater than right veiling pleural effusions, visible mediastinal contours, and overall lung ventilation appears stable from yesterday. IMPRESSION: 1. Right IJ central line placed with no adverse features. 2. Otherwise satisfactory lines and tubes. 3. Stable ventilation with left greater than right pleural effusions. Electronically Signed   By: Genevie Ann M.D.   On: 02/15/2022 05:48   [place] EKG SITE RITE  Result Date: 02/15/2022 If Site Rite image not attached, placement could not be confirmed due to current cardiac rhythm.  CT ABDOMEN PELVIS W CONTRAST  Result Date: 02/13/2022 CLINICAL DATA:  Chronic lymphocytic leukemia, history lymphoma with increasing lower extremity swelling, weight loss. Called EMS initially for abdominal pain  and became unresponsive with EMS. EXAM: CT ABDOMEN AND PELVIS WITH CONTRAST TECHNIQUE: Multidetector CT imaging of the abdomen and pelvis was performed using the standard protocol following bolus administration of intravenous contrast. RADIATION DOSE REDUCTION: This exam was performed according to the departmental dose-optimization program which includes automated exposure control, adjustment of the mA and/or kV according to patient size and/or use of iterative reconstruction technique. CONTRAST:  139m OMNIPAQUE IOHEXOL 350 MG/ML SOLN COMPARISON:  CT abdomen and pelvis with contrast 01/06/2020 and 06/25/2016, also CTA chest earlier today. FINDINGS: Lower chest: Large bilateral pleural effusions are again noted, on the left depressing the diaphragm and slightly shifting the mediastinum to the right with compressive collapse/consolidation of the lower lobes and portions of the upper and right middle lobes. The cardiac size is normal. Multiple bulky masses in the low axillary regions on  the left-greater-than-right are again shown. Largest visible mass on the left is 5.5 x 3.9 cm largest visible mass on the right is 3.9 x 2.3 cm. There are enlarged subcarinal and right hilar nodes partially visible mildly prominent left hilar nodes. There are multiple enlarged retrocrural lymph nodes largest of these is to the right measuring 2.5 x 1.2 cm (series 5 axial 26). Hepatobiliary: Status post cholecystectomy. No biliary dilatation is seen. There is intrahepatic periportal edema which is probably either due to fluid overload or hepatic dysfunction in this case given the interval marked weight loss. There is chronic 1.3 cm subcapsular hypodensity in the left lobe of the liver along side the gallbladder fossa unchanged and probably a small hemangioma or complex cyst. In the anterior segment of the right lobe of the liver on 5:44 there is a small flash filling hemangioma which was seen previously and unchanged. No hepatic mass enhancement is seen. The portal vein is patent within normal caliber limits. Pancreas: there are scattered coarse chronic calcifications in the pancreas consistent with chronic calcific pancreatitis but no findings of acute pancreatitis, mass or ductal dilatation. Spleen:There is heterogeneous splenic enhancement. There could be multiple small hypodense lesions within the spleen or the heterogeneity could be due to bolus phase phenomenon but the spleen is not enlarged for size. Adrenals/Urinary Tract: No adrenal or renal cortical mass enhancement. There is a 1.5 cm cyst of the posterior lower right kidney additional scattered bilateral subcentimeter hypodensities which are too small to characterize. There is no urinary stone or obstruction. The bladder is catheterized contracted and not well seen. Stomach/Bowel: NGT is coiled in the stomach with the tip in the body of the stomach. There are thickened folds in the stomach and small bowel no small bowel obstruction or gross inflammatory  reaction. An appendix is not seen. There is moderate stool retention transverse and descending colon. Thickening of the ascending colon is noted versus underdistention. Vascular/Lymphatic: There is moderate to heavy aortoiliac calcification without aneurysm. The portal vein splenic vein and SMV are patent. There is extensive retroperitoneal adenopathy including in the periportal portacaval and retrocaval spaces with portacaval nodes up to 1.6 cm in short axis periportal nodes up to 1.8 cm in short axis and large confluent mantle of adenopathy extending along the retroperitoneum into in the kidneys lifting the aorta anteriorly encasing the aorta and IVC and continuing inferiorly along the pelvic sidewalls with additional bulky adenopathy in both sidewalls and both inguinal areas. The mantle of adenopathy extends from the level of the SMA inferior to the aortic bifurcation up to 15 cm in length and up to at least 10 cm coronal  and 4.7 cm AP on 5:45. There is additional presacral coccygeal confluent adenopathy which is less well-defined but measures approximately 10 by 4 cm on 5:69. Bilateral bulky pelvic sidewall lymph nodes largest in the external chains are noted largest right external iliac chain node is 4.6 x 3.4 cm on 5:74 largest on the left 5.6 by 3.7 cm on 5:73 encasement of internal and external iliac vasculature without significant vascular narrowing. There is similar bulky bilateral inguinal chain adenopathy. Reproductive: The uterus is intact but not well seen. The ovaries are obscured by overlapping structures and adenopathy. Other: There is a small volume of abdominal and pelvic free ascites. No free air, hemorrhage or abscess is seen. There is diffuse mesenteric haziness and body wall anasarca. Interval weight loss consistent with cachexia. Musculoskeletal: There are degenerative changes in the lumbar spine but no destructive osseous process. IMPRESSION: 1. Extensive abdominopelvic and inguinal  adenopathy, most likely related to lymphoma/leukemia. 2. Numerous small hypodense splenic lesions versus bolus phase phenomenon. 3. Interval weight loss with very little body fat, appearance of cachexia, diffuse anasarca and small-volume abdominal and pelvic ascites. 4. Intrahepatic periportal edema which is probably either due to fluid overload or hepatic dysfunction. The portal vein is patent within normal caliber limits. 5. Chronic calcific pancreatitis but no findings of acute pancreatitis or ductal dilatation. 6. Ascending colitis versus nondistention, remainder of the colon with constipation. 7. Diffuse small intestinal wall thickening with gastric fold thickening, could relate to gastroenteritis or hepatic dysfunction versus reactive from ascites versus congestive or infiltrating disease. 8. Large low axillary masses, with subcarinal and hilar adenopathy. 9. Large pleural effusions. 10. Aortic atherosclerosis. Electronically Signed   By: Telford Nab M.D.   On: 02/13/2022 21:54   CT Head Wo Contrast  Result Date: 03/12/2022 CLINICAL DATA:  Mental status changes, unknown cause. EXAM: CT HEAD WITHOUT CONTRAST TECHNIQUE: Contiguous axial images were obtained from the base of the skull through the vertex without intravenous contrast. RADIATION DOSE REDUCTION: This exam was performed according to the departmental dose-optimization program which includes automated exposure control, adjustment of the mA and/or kV according to patient size and/or use of iterative reconstruction technique. COMPARISON:  MRI brain 02/01/2004 report. FINDINGS: Brain: There is slight cerebral cortical atrophy with mild-to-moderate small-vessel disease of the cerebral white matter, with white matter changes only minimally progressed from 18 years ago. 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 There is a mostly calcified extra-axial  parasagittal high left frontal convexity meningioma measuring 1.8 x 1.5 x 1.3 cm, previously 1.5 x 0.9 cm, mildly deforming an underlying parasagittal superior left frontal gyrus but without underlying vasogenic edema. There are aneurysm coils with interval coiling of the previously noted left MCA bifurcation aneurysm. This causes moderate streak artifact through the adjacent temporal lobes and upper posterior fossa structures. Allowing for metallic artifacts there are no findings seen suspicious for acute cortical based infarct, hemorrhage, mass effect or midline shift. The cerebellum and brainstem are unremarkable. The ventricles are normal in size and position. Basal cisterns are clear. Vascular: There are scattered calcifications of the carotid siphons but no hyperdense central vasculature. Skull: No fracture or focal lesion. Sinuses/Orbits: There is patchy membrane thickening in the ethmoids. There is a 1 cm osteoma in the left frontal sinus. Other visualized sinuses are clear. No mastoid effusion. Other: None. IMPRESSION: 1. Moderate metallic artifact from aneurysm coils at the left MCA  bifurcation. Allowing for streak artifacts no acute intracranial process is suspected. 2. 1.8 x 1.5 x 1.3 cm mostly calcified meningioma in the parasagittal high left frontal convexity mildly deforming and underlying parasagittal superior frontal gyrus. This appears to have grown only minimally since 18 years ago. 3. Slight atrophy with mild-to-moderate small-vessel disease with only minimal progression of the small-vessel changes , no increased atrophy in the interval. 4. 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 5. Sinus membrane disease. Electronically Signed   By: Telford Nab M.D.   On: 03/02/2022 21:21   CT Angio Chest PE W and/or Wo Contrast  Result Date: 03/05/2022 CLINICAL DATA:  Unresponsive EXAM: CT ANGIOGRAPHY CHEST WITH CONTRAST TECHNIQUE: Multidetector CT imaging of the chest was  performed using the standard protocol during bolus administration of intravenous contrast. Multiplanar CT image reconstructions and MIPs were obtained to evaluate the vascular anatomy. RADIATION DOSE REDUCTION: This exam was performed according to the departmental dose-optimization program which includes automated exposure control, adjustment of the mA and/or kV according to patient size and/or use of iterative reconstruction technique. CONTRAST:  171m OMNIPAQUE IOHEXOL 350 MG/ML SOLN COMPARISON:  CT 01/06/2020, chest x-ray 02/23/2022, CT chest 02/14/2016 FINDINGS: Cardiovascular: Satisfactory opacification of the pulmonary arteries to the segmental level. No evidence of pulmonary embolism. Aorta is nonaneurysmal. Mild atherosclerosis. No dissection. Normal cardiac size. No significant pericardial effusion. Mediastinum/Nodes: Midline trachea. Endotracheal tube tip several cm above the carina. Esophageal tube tip below the diaphragm but incompletely visualized. Numerous enlarged supraclavicular and axillary nodes, poorly defined due to lack of subcutaneous fat. Negative for thyroid mass. Ill-defined soft tissue density in the mediastinum, may reflect combination of fluid and adenopathy. Lungs/Pleura: Large bilateral pleural effusions with probable passive atelectasis of the lungs. No visible pneumothorax Upper Abdomen: Findings are dictated separately. Musculoskeletal: Sternum is intact. No definite acute osseous abnormality. Anasarca. Multiple large masses within the low axillary regions bilaterally, extending into the upper outer quadrant of the left breast. When compared to 2017, mostly fatty breast tissue was noted previously, today the breasts are diffusely infiltrated with fluid or soft tissue. This appears worse on the left side. Review of the MIP images confirms the above findings. IMPRESSION: 1. Negative for acute pulmonary embolus. 2. Large bilateral pleural effusions with probable passive atelectasis in  the lungs. 3. Lack of subcutaneous fat limits the exam. Extensive supraclavicular and lower neck adenopathy. Bilateral axillary adenopathy. Multiple large left greater than right axillary/chest wall masses, extending into the upper outer quadrants of the breasts, presumably representing adenopathy however the breasts today appear diffusely infiltrated with either edema or soft tissue/mass, recommend correlation with physical exam. Collectively the findings are suspect for lymphoma or metastatic disease. 4. Anasarca Aortic Atherosclerosis (ICD10-I70.0). Electronically Signed   By: KDonavan FoilM.D.   On: 03/08/2022 21:11   DG Chest Portable 1 View  Result Date: 02/27/2022 CLINICAL DATA:  Altered mental status EXAM: PORTABLE CHEST 1 VIEW COMPARISON:  01/09/2015 FINDINGS: Tip of endotracheal tube is 4.8 cm above the carina. Distal portion of enteric tube is seen in the stomach. There is moderate to large left pleural effusion some of which appears to be loculated along the lateral margin of left upper and left mid lung fields. Evaluation of left lower lung fields for infiltrates is limited by the effusion. There is small right pleural effusion. Linear density in the right mid lung fields may suggest fluid in the interlobar fissure or subsegmental atelectasis. Central pulmonary vessels are more prominent.  IMPRESSION: Central pulmonary vessels are more prominent suggesting CHF. Moderate to large left pleural effusion. Small right pleural effusion. Electronically Signed   By: Elmer Picker M.D.   On: 03/06/2022 18:55    Labs:  CBC: Recent Labs    03/07/22 0525 03/09/22 0500 03/10/22 0346 03/12/22 0418  WBC 61.6* 54.2* 44.9* 44.9*  HGB 11.0* 11.0* 9.9* 11.4*  HCT 35.9* 33.9* 31.7* 35.7*  PLT 206 215 180 185    COAGS: Recent Labs    02/16/22 0318  INR 1.3*  APTT 32    BMP: Recent Labs    03/09/22 0500 03/10/22 0346 03/11/22 1136 03/12/22 0418  NA 140 142 137 140  K 3.6 3.9 5.1 4.2   CL 112* 113* 110 107  CO2 '22 25 24 27  '$ GLUCOSE 81 144* 134* 139*  BUN 39* 41* 35* 28*  CALCIUM 8.1* 7.9* 7.9* 8.0*  CREATININE 0.44 0.46 0.36* 0.32*  GFRNONAA >60 >60 >60 >60    LIVER FUNCTION TESTS: Recent Labs    02/27/22 0613 03/03/22 0620 03/06/22 0554 03/10/22 0346  BILITOT 0.3 0.3 0.6 0.3  AST 33 32 33 26  ALT 35 31 45* 36  ALKPHOS 71 74 57 65  PROT 4.9* 5.1* 4.4* 4.0*  ALBUMIN 2.2* 2.2* 2.8* 2.1*    TUMOR MARKERS: No results for input(s): "AFPTM", "CEA", "CA199", "CHROMGRNA" in the last 8760 hours.  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 Bango, 61 year old female, is scheduled March 17, 2022 0830 for an image-guided lymphangiogram with possible thoracic duct embolization. The patient will be transferred to [facility] the morning of the procedure. The procedure will be done under general anesthesia and the patient will be transferred back to [facility]. The procedure and the plan were discussed with the patient at the bedside. She is in agreement to proceed. As in our previous meeting on June 21, I discussed with the patient the challenging nature of this procedure and that a thoracic duct embolization may or may not be possible. She knows that even if an embolization is performed it may not completely stop the accumulation of the chylous fluid.   Risks and benefits of this procedure were discussed with the patient including, but not limited to bleeding, infection, vascular injury or contrast induced renal failure.  This interventional procedure involves the use of X-rays and because of the nature of the planned procedure, it is possible that we will have prolonged use of X-ray fluoroscopy.  Potential radiation risks to you include (but are not limited to) the following: - A slightly elevated risk for cancer  several years later in life. This risk is typically less than 0.5% percent. This  risk is low in comparison to the normal incidence of human cancer, which is 33% for women and 50% for men according to the [employer]. - Radiation induced injury can include skin redness, resembling a rash, tissue breakdown / ulcers and hair loss (which can be temporary or permanent).   The likelihood of either of these occurring depends on the difficulty of the procedure and whether you are sensitive to radiation due to previous procedures, disease, or genetic conditions.   IF your procedure requires a prolonged use of radiation, you will be notified and given written instructions for further action.  It is your responsibility to monitor the irradiated area for the 2 weeks following the procedure and to notify your physician if you are concerned that you have  suffered a radiation induced injury.    All of the patient's questions were answered, patient is agreeable to proceed. She will be NPO at midnight July 3. CBC, BMP and PT/INR have been ordered for the morning of the procedure.   Consent signed and in chart.  Thank you for this interesting consult.  I greatly enjoyed meeting TRENIYA LOBB and look forward to participating in their care.  A copy of this report was sent to the requesting provider on this date.  Electronically Signed: Soyla Dryer, AGACNP-BC [phone] 03/12/2022, 3:01 PM   I spent a total of 20 Minutes    in face to face in clinical consultation, greater than 50% of which was counseling/coordinating care for lymphangiogram with possible thoracic duct embolization.

## 2022-03-12 NOTE — Progress Notes (Signed)
PROGRESS NOTE [name]  MAU:[id number] DOB: [birth year] DOA: 02/27/2022 PCP: Noreene Larsson, NP   Brief Narrative/Hospital Course: 61 year old female with a history of CLL, initially presents to the hospital with respiratory failure and bilateral pleural effusions.  She was also noted to have hypotension and concerns for shock since she required vasopressors.  She was admitted to the ICU and due to respiratory failure, required intubation.  She was eventually able to extubate and wean off of vasopressors.  For her bilateral pleural effusions, she had thoracentesis and eventually had chest tube placed bilaterally.  Fluid from chest tube was noted to be consistent with chylothorax.  She was seen by oncology, started on pulse dose steroids as well as radiation therapy for mediastinal lymphadenopathy.  Unfortunately, her chylothorax has not improved and she continues to have large amounts of output through her chest tubes.  Pulmonology is also following and she was started on sirolimus.  After several conversations between pulmonology, CVTS and interventional radiology, patient is now scheduled for thoracic duct embolization in interventional radiology coming Monday at [facility].  Assessment and Plan: Principal Problem:   Acute respiratory failure with hypoxia (HCC) Active Problems:   Sepsis ([place])   CLL (chronic lymphocytic leukemia) (HCC)   Shock ([place])   Chronic diastolic heart failure ([place])   DNR (do not resuscitate)   Protein-calorie malnutrition, severe   Pressure injury of skin   Bilateral chylothorax   Small cell B-cell lymphoma (HCC)   Leukocytosis   DM type 2 (diabetes mellitus, type 2) ([place])  Acute respiratory failure with hypoxia,Initially on vent on admit > on [place] now,stable. Bilateral chylothorax, w/ persistent output: Respiratory  failure due to bilateral chylothorax in the setting of CLL.  Had chest tube in place on rt- but 03/02/22 am-got dislodged patient reluctant for  reinsertion, subsequent implant PleurX catheter for 6/19.Patient with ongoing tachypnea having worsening respiratory disease, unable to lay flat for radiation therapy and had further discussion by PCCM underwent bilateral chest tube placement 6/19. Pleurx may not be ideal as she will not be able to take care of this at home and will have difficult placement with Pleurx.  Continues to have significant output from chest tubes.  Being managed by PCCM.  On octreotide and TPN in attempt to decrease chyle production. PCCM discussed case with Dr. Kipp Brood, CVTS who recommended IR consult for possible lymphoscintigraphy and coil embolization of the thoracic duct.  Per pulmonology, she has been started on sirolimus in the interim.  IR has now scheduled her for thoracic duct embolization on Monday at [facility] and then patient will return back to [facility] and will likely be admitted back under ICU team.  Shock/Hypotension poa: completed 5 days of antibiotics, no clear source of infection was identified, therefore cannot definitively rule in/rule out sepsis. Currently on midodrine for hypotension. Hemodynamics currently stable  CLL/SLL Significant leukocytosis: Oncology following> currently on prednisone 50 mg daily and XRT started 6/15. Dr. Lorenso Courier and radiation oncology team following.  Continues to have significant leukocytosis, although improved since admission.  Monitor Recent Labs  Lab 03/06/22 0520 03/07/22 0525 03/09/22 0500 03/10/22 0346 03/12/22 0418  WBC 60.3* 61.6* 54.2* 44.9* 44.9*    Chronic diastolic heart failure: Compensated, got lasix iv x1 6/17,monitor closely.Net IO Since Admission: -3,276.14 mL [03/12/22 1231] . Filed Weights   03/10/22 0348 03/11/22 0418 03/11/22 1144  Weight: 49.6 kg 51.7 kg 51.5 kg    Hyperkalemia resolved.  Hypokalemia repleted.  T2DM, new onset  A1c 6.5, w/ uncontrolled hyperglycemia- and episode of hypoglycemia while holding TPN.  Currently blood  sugar stable continue Semglee 10 units, SSI, monitor. Recent Labs  Lab 03/11/22 2004 03/11/22 2341 03/12/22 0417 03/12/22 0743 03/12/22 1207  GLUCAP 110* 111* 129* 110* 220*    Protein-calorie malnutrition, severe: Augment nutritional status, cont TPN, continue supplement  Nutrition Problem: Severe Malnutrition Etiology: chronic illness (CLL) Signs/Symptoms: severe muscle depletion, severe fat depletion Interventions: Boost Breeze  Deconditioning/debility: PT OT as tolerated but limited due to chest tubes.  Goals of care  -currently DNR -palliative care following  DVT prophylaxis: enoxaparin (LOVENOX) injection 40 mg Start: 02/16/22 1515 Code Status:   Code Status: DNR Family Communication: None present Patient status is: inpatient because continues to require bilateral chest tube placement and is scheduled for procedure with IR on Monday, 04/13/2022. Level of care: Progressive   Dispo: The patient is from: Home, independent.            Anticipated disposition: Will need skilled nursing facility    Mobility Assessment (last 72 hours)     Mobility Assessment     Row Name 03/11/22 2007 03/10/22 2000 03/10/22 1121 03/10/22 0851     Does patient have an order for bedrest or is patient medically unstable No - Continue assessment No - Continue assessment -- No - Continue assessment    What is the highest level of mobility based on the progressive mobility assessment? -- Level 4 (Walks with assist in room) - Balance while marching in place and cannot step forward and back - Complete Level 5 (Walks with assist in room/hall) - Balance while stepping forward/back and can walk in room with assist - Complete Level 5 (Walks with assist in room/hall) - Balance while stepping forward/back and can walk in room with assist - Complete    Is the above level different from baseline mobility prior to current illness? -- Yes - Recommend PT order -- Yes - Recommend PT order             Subjective: Patient seen and examined.  She was eating breakfast.  She said that she was feeling better.  No shortness of breath.  Able to speak in full sentences.  Objective: Vitals last 24 hrs: Vitals:   03/11/22 0418 03/11/22 1144 03/11/22 2011 03/12/22 0413  BP:   116/72 112/84  Pulse:   (!) 101 (!) 106  Resp:   20 20  Temp:   99.5 F (37.5 C) 99.5 F (37.5 C)  TempSrc:   Oral Oral  SpO2:   94% 93%  Weight: 51.7 kg 51.5 kg    Height:       Weight change: -0.2 kg  Physical Examination:  General exam: Appears calm and comfortable  Respiratory system: Diminished breath sounds at the bases bilaterally respiratory effort normal.  Chest tube in place bilaterally Cardiovascular system: S1 & S2 heard, RRR. No JVD, murmurs, rubs, gallops or clicks. No pedal edema. Gastrointestinal system: Abdomen is nondistended, soft and nontender. No organomegaly or masses felt. Normal bowel sounds heard. Central nervous system: Alert and oriented. No focal neurological deficits. Extremities: Symmetric 5 x 5 power. Skin: No rashes, lesions or ulcers.  Psychiatry: Judgement and insight appear normal. Mood & affect appropriate.     Medications reviewed: Scheduled Meds:  Chlorhexidine Gluconate Cloth  6 each Topical Daily   docusate sodium  100 mg Oral BID   enoxaparin (LOVENOX) injection  40 mg Subcutaneous Q24H   feeding supplement  1 Container  Oral TID BM   insulin aspart  0-15 Units Subcutaneous Q4H   insulin glargine-yfgn  14 Units Subcutaneous Daily   midodrine  5 mg Oral TID WC   octreotide  50 mcg Subcutaneous TID   polyethylene glycol  17 g Oral Daily   predniSONE  50 mg Oral Q breakfast   Sirolimus  2 mg Oral Daily   sodium chloride flush  10 mL Other Q8H   sodium chloride flush  10 mL Other Q8H   sodium chloride flush  10-40 mL Intracatheter Q12H   Continuous Infusions:  sodium chloride 10 mL/hr at 03/11/22 1926   sodium chloride Stopped (03/09/22 0959)   sodium chloride  Stopped (03/07/22 1945)   TPN ADULT (ION) 100 mL/hr at 03/11/22 1926   TPN ADULT (ION)     Diet Order             Diet regular Room service appropriate? Yes; Fluid consistency: Thin  Diet effective now                    Nutrition Problem: Severe Malnutrition Etiology: chronic illness (CLL) Signs/Symptoms: severe muscle depletion, severe fat depletion Interventions: Boost Breeze   Intake/Output Summary (Last 24 hours) at 03/12/2022 1231 Last data filed at 03/12/2022 0900 Gross per 24 hour  Intake 3101.77 ml  Output 330 ml  Net 2771.77 ml    Net IO Since Admission: -3,276.14 mL [03/12/22 1231]  Wt Readings from Last 3 Encounters:  03/11/22 51.5 kg  03/08/20 64.4 kg  02/07/20 68.5 kg     Unresulted Labs (From admission, onward)     Start     Ordered   02/27/22 0500  Comprehensive metabolic panel  (TPN Lab Panel)  Every Mon,Thu (0500),   R     Question:  Specimen collection method  Answer:  Unit=Unit collect   02/25/22 1228   02/27/22 0500  Magnesium  (TPN Lab Panel)  Every Mon,Thu (0500),   R     Question:  Specimen collection method  Answer:  Unit=Unit collect   02/25/22 1228   02/27/22 0500  Phosphorus  (TPN Lab Panel)  Every Mon,Thu (0500),   R     Question:  Specimen collection method  Answer:  Unit=Unit collect   02/25/22 1228   02/27/22 0500  Triglycerides  (TPN Lab Panel)  Every Mon,Thu (0500),   R     Question:  Specimen collection method  Answer:  Unit=Unit collect   02/25/22 1228   03/12/2022 1442  Acid Fast Culture with reflexed sensitivities  (AFB smear + Culture w reflexed sensitivities panel)  Once,   R       See Hyperspace for full Linked Orders Report.   03/05/2022 1441          Data Reviewed: I have personally reviewed following labs and imaging studies CBC: Recent Labs  Lab 03/06/22 0520 03/07/22 0525 03/09/22 0500 03/10/22 0346 03/12/22 0418  WBC 60.3* 61.6* 54.2* 44.9* 44.9*  HGB 10.4* 11.0* 11.0* 9.9* 11.4*  HCT 33.0* 35.9* 33.9*  31.7* 35.7*  MCV 96.2 94.5 93.9 96.1 93.5  PLT 212 206 215 180 696    Basic Metabolic Panel: Recent Labs  Lab 03/06/22 0554 03/07/22 0525 03/08/22 0412 03/09/22 0500 03/10/22 0346 03/11/22 1136 03/12/22 0418  NA 141   < > 139 140 142 137 140  K 3.4*   < > 4.2 3.6 3.9 5.1 4.2  CL 111   < > 109 112* 113* 110 107  CO2 25   < > '25 22 25 24 27  '$ GLUCOSE 195*   < > 109* 81 144* 134* 139*  BUN 34*   < > 39* 39* 41* 35* 28*  CREATININE 0.42*   < > 0.47 0.44 0.46 0.36* 0.32*  CALCIUM 7.8*   < > 8.0* 8.1* 7.9* 7.9* 8.0*  MG 1.7  --  2.1 1.9 2.0  --   --   PHOS 2.9  --  3.4 3.0 3.0  --   --    < > = values in this interval not displayed.    GFR: Estimated Creatinine Clearance: 60 mL/min (A) (by C-G formula based on SCr of 0.32 mg/dL (L)). Liver Function Tests: Recent Labs  Lab 03/06/22 0554 03/10/22 0346  AST 33 26  ALT 45* 36  ALKPHOS 57 65  BILITOT 0.6 0.3  PROT 4.4* 4.0*  ALBUMIN 2.8* 2.1*    No results for input(s): "LIPASE", "AMYLASE" in the last 168 hours. No results for input(s): "AMMONIA" in the last 168 hours. Coagulation Profile: No results for input(s): "INR", "PROTIME" in the last 168 hours. BNP (last 3 results) No results for input(s): "PROBNP" in the last 8760 hours. HbA1C: No results for input(s): "HGBA1C" in the last 72 hours. CBG: Recent Labs  Lab 03/11/22 2004 03/11/22 2341 03/12/22 0417 03/12/22 0743 03/12/22 1207  GLUCAP 110* 111* 129* 110* 220*    Lipid Profile: Recent Labs    03/10/22 0346  TRIG 59     Thyroid Function Tests: No results for input(s): "TSH", "T4TOTAL", "FREET4", "T3FREE", "THYROIDAB" in the last 72 hours. Sepsis Labs: No results for input(s): "PROCALCITON", "LATICACIDVEN" in the last 168 hours.  Recent Results (from the past 240 hour(s))  Body fluid culture w Gram Stain     Status: None   Collection Time: 03/04/22 10:00 AM   Specimen: Pleural Fluid  Result Value Ref Range Status   Specimen Description   Final     PLEURAL RT Performed at [facility] [address], [place], [place] [address]    Special Requests   Final    NONE Performed at [facility], [place] [address] [place], [place] [address]    Gram Stain NO WBC SEEN NO ORGANISMS SEEN   Final   Culture   Final    NO GROWTH 3 DAYS Performed at [facility], [place] [address] [place], [place] [address]    Report Status 03/07/2022 FINAL  Final  Body fluid culture w Gram Stain     Status: None   Collection Time: 03/04/22 10:00 AM   Specimen: Pleural Fluid  Result Value Ref Range Status   Specimen Description   Final    PLEURAL LT Performed at [facility] [address], [place], [place] [address]    Special Requests   Final    NONE Performed at [facility], [place] [address], [place], [place] [address]    Gram Stain   Final    FEW WBC PRESENT,BOTH PMN AND MONONUCLEAR NO ORGANISMS SEEN    Culture   Final    NO GROWTH 3 DAYS Performed at [facility], [place] [address], [place], [place] [address]    Report Status 03/07/2022 FINAL  Final     Antimicrobials: Anti-infectives (From admission, onward)    Start     Dose/Rate Route Frequency Ordered Stop   02/17/22 1600  metroNIDAZOLE (FLAGYL) IVPB 500 mg  500 mg 100 mL/hr over 60 Minutes Intravenous Every 12 hours 02/17/22 0713 02/18/22 1926   02/16/22 1600  ceFEPIme (MAXIPIME) 2 g in sodium chloride 0.9 % 100 mL IVPB        2 g 200 mL/hr over 30 Minutes Intravenous Every 12 hours 02/16/22 0818 02/18/22 1847   02/16/22 1500  metroNIDAZOLE (FLAGYL) IVPB 500 mg  Status:  Discontinued        500 mg 100 mL/hr over 60 Minutes Intravenous Every 12 hours 02/16/22 1404 02/17/22 0711   02/15/22 1800  vancomycin (VANCOREADY) IVPB 1250 mg/250 mL  Status:  Discontinued        1,250 mg 166.7 mL/hr over 90 Minutes Intravenous Every 24 hours 02/19/2022 1955 02/15/22 0737   02/15/22 1415  metroNIDAZOLE  (FLAGYL) IVPB 500 mg  Status:  Discontinued        500 mg 100 mL/hr over 60 Minutes Intravenous Every 12 hours 02/15/22 1325 02/16/22 0840   02/15/22 0400  ceFEPIme (MAXIPIME) 2 g in sodium chloride 0.9 % 100 mL IVPB  Status:  Discontinued        2 g 200 mL/hr over 30 Minutes Intravenous Every 8 hours 02/19/2022 1955 02/16/22 0817   03/04/2022 2000  vancomycin (VANCOREADY) IVPB 1250 mg/250 mL  Status:  Discontinued        1,250 mg 166.7 mL/hr over 90 Minutes Intravenous Every 24 hours 02/23/2022 1952 02/19/2022 1955   02/22/2022 1945  ceFEPIme (MAXIPIME) 2 g in sodium chloride 0.9 % 100 mL IVPB        2 g 200 mL/hr over 30 Minutes Intravenous  Once 03/12/2022 1931 03/04/2022 2051   02/26/2022 1945  metroNIDAZOLE (FLAGYL) IVPB 500 mg        500 mg 100 mL/hr over 60 Minutes Intravenous  Once 03/07/2022 1931 03/09/2022 2053   03/11/2022 1945  vancomycin (VANCOCIN) IVPB 1000 mg/200 mL premix  Status:  Discontinued        1,000 mg 200 mL/hr over 60 Minutes Intravenous  Once 02/18/2022 1931 03/09/2022 2053      Culture/Microbiology    Component Value Date/Time   SDES  03/04/2022 1000    PLEURAL RT Performed at [facility], [place] [address] [place], [place] [address]    SDES  03/04/2022 1000    PLEURAL LT Performed at [facility], [place] [address], [place], [place] [address]    SPECREQUEST  03/04/2022 1000    NONE Performed at [facility], [place] [address] [place], [place] [address]    SPECREQUEST  03/04/2022 1000    NONE Performed at [facility], [place] [address], [place], [place] [address]    CULT  03/04/2022 1000    NO GROWTH 3 DAYS Performed at [facility] [address] [place], [place] [address]    CULT  03/04/2022 1000    NO GROWTH 3 DAYS Performed at [facility], [place] [address] [place], [place] [address]    REPTSTATUS 03/07/2022 FINAL 03/04/2022 1000   REPTSTATUS 03/07/2022 FINAL 03/04/2022 1000     Other culture-see note  Radiology Studies: DG CHEST PORT 1 VIEW  Result Date: 03/12/2022 CLINICAL DATA:  Pleural effusion EXAM: PORTABLE CHEST 1 VIEW COMPARISON:  Multiple prior chest radiographs including most recent dated March 11, 2022 FINDINGS: The heart size and mediastinal contours are within normal limits. No significant interval change in bilateral pleural effusions with chest tubes in place. Atelectasis of the right middle lobe,  unchanged. No appreciable pneumothorax. Left access PICC with distal tip in the right atrium, unchanged. Bilateral glenohumeral osteoarthritis. No acute osseous abnormality. IMPRESSION: 1. Bilateral chest tubes in place with unchanged appearance of bilateral pleural effusions and right midlung atelectasis. No appreciable pneumothorax. 2.  Stable cardiomediastinal silhouette. Electronically Signed   By: Keane Police D.O.   On: 03/12/2022 12:04   DG CHEST PORT 1 VIEW  Result Date: 03/11/2022 CLINICAL DATA:  Pleural effusion.  History of hypertension. EXAM: PORTABLE CHEST 1 VIEW COMPARISON:  03/09/2022 and CT chest on 02/22/2022 FINDINGS: Patient has bilateral pleural catheters overlying the lung bases. LEFT-sided PICC line tip overlies the RIGHT atrium. Heart size is normal. There is increased opacification at the LEFT lung base, consistent with pleural effusion and atelectasis or infiltrates. Possible small residual LEFT apical pneumothorax. No definite RIGHT pneumothorax identified. There is new opacity at the RIGHT lung base, likely representing fluid within the minor fissure. IMPRESSION: 1. Increased opacification of the LEFT lung base. 2. Increased fluid in the minor fissure. 3. Possible persistent LEFT apical pneumothorax. Electronically Signed   By: Nolon Nations M.D.   On: 03/11/2022 11:19     LOS: 70 days   Darliss Cheney, MD [facility]  03/12/2022, 12:31 PM

## 2022-03-12 NOTE — TOC Progression Note (Signed)
Transition of Care [facility]) - Progression Note    Patient Details  Name: [name] MRN: [id number] Date of Birth: [birth year]  [facility] CM/SW Contact  Mostyn Varnell, Juliann Pulse, RN Phone Number: 03/12/2022, 3:25 PM  Clinical Narrative:  Noted for Thoracic duct embolization on 03/19/2022 @ [facility] will continue to follow. Left vm w/Jacobs Bourbon for call back in reference to considering. Supv updated.On 02,bilateral chest tubes,TPN,sacral/back wounds.      Expected Discharge Plan: Long Term Nursing Home Barriers to Discharge: Continued Medical Work up, Ship broker, Other (must enter comment) (LTC placement)  Expected Discharge Plan and Services Expected Discharge Plan: [place]     Post Acute Care Choice: Nursing Home Living arrangements for the past 2 months: Single Family Home                                       Social Determinants of Health (SDOH) Interventions    Readmission Risk Interventions     No data to display

## 2022-03-12 NOTE — Progress Notes (Signed)
PHARMACY - TOTAL PARENTERAL NUTRITION CONSULT NOTE   Indication:  bilateral chylothorax with high chest tube output  Patient Measurements: Height: '5\' 5"'$  (165.1 cm) Weight: 51.5 kg (113 lb 8.6 oz) IBW/kg (Calculated) : 57 TPN AdjBW (KG): 64 Body mass index is 18.89 kg/m.  Assessment: 61 year old female with pertinent PMH of CLL, depression, anxiety, HLD, HTN presents [facility] ED on 6/2 unresponsive.  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 Patient also has bilateral chylothorax with high output. Pharmacy has been consulted for TPN.  Glucose / Insulin: new onset DM2, A1c 6.5  On mSSI q4h, Semglee 14 units daily - used 11 units of SSI past 24 hrs (decreased requirement w/increased Semglee dose) - CBGs (goal <180) - range 100-203 (improved) - on prednisone 50 mg daily  Electrolytes: WNL including CorrCa (9.5) Renal: Scr < 1, BUN elevated but trending down Hepatic: LFTs WNL - TG WNL/stable - albumin low Intake / Output:  24 hr UOP: 100 mL + 5 unmeasured; Chest tube output: 340 mL (decreasing) - PO intake: 100% meal intake charted MIVF:  none  GI Imaging: 6/10 CT Chest Bilateral large pleural effusions and bulky axillary and lower cervical lymphadenopathy  GI Surgeries / Procedures: None this admission  Central access: PICC placed 6/15 TPN start date: 6/13  Nutritional Goals: Goal TPN rate is 100 mL/hr (provides 150 g of protein and 2308 kcals per day)  RD Assessment: Estimated Needs Total Energy Estimated Needs: 2300-2600 Total Protein Estimated Needs: 140-160 grams Total Fluid Estimated Needs: >2.5 L  Current Nutrition:  - Regular Low fat diet, no more than 7 g fat per meal - Boost/resource breeze TID (charted as given) - TPN at goal rate  Significant Events:  - 6/14: TPN turned off at ~10:30p for transport to [facility]. D10 started while off TPN - 6/16 am TPN off/on D10 for K 5.6, TPN resumed 1800 - 6/24: Reduced dextrose concentration to 14% to target  glucose infusion rate < 5 mg/kg/min -6/28: Inquired about plan for TPN/nutrition - will we start to wean based off of PO intake or chest tube output?  Plan:  At 1800: Continue TPN at 100 mL/hr goal rate Electrolytes in TPN: No changes Na 50 mEq/L K  15  mEq/L Ca 5 mEq/L Mg 5 mEq/L Phos 10 mmol/L Cl:Ac 1:2 Add standard MVI and trace elements to TPN Continue Semglee 14 units daily Continue moderate SSI q4h and adjust as needed. Monitor TPN labs on Mon/Thurs  Lenis Noon, PharmD, BCPS Clinical Pharmacist 03/12/2022 9:20 AM

## 2022-03-12 NOTE — Progress Notes (Signed)
NAME:  [name], MRN:  [id number], DOB:  1961/02/20, LOS: 43 ADMISSION DATE:  02/16/2022, CONSULTATION DATE:  6/2 REFERRING MD:  Dr. Alvino Chapel, CHIEF COMPLAINT:  Unresponsive; Acute resp failure   BRIEF  Patient is a 61 year old female with pertinent PMH of CLL, depression, anxiety, HLD, HTN presents to [facility] ED on 6/2 unresponsive.  On 6/2 patient called EMS for abdominal pain.  Patient initially was alert and talking but became unresponsive with AMS.  Patient was hypoxic with agonal breathing and was placed on NRB.  Patient was noted to be hypotensive.  Patient transported to [facility] ED.  Upon arrival to [facility] ED patient remained unresponsive with agonal respirations.  Patient intubated for airway protection and hypoxia.  BP initially 81/65.  Given IV fluids.  Started on levo.  Fever 100.9 F and WBC 104.4.  Started on cefepime, Flagyl, Vanco.  Cultures pending.  Glucose 198, BNP 534, troponin 109 then 112, LA 4.4 then 1.8, UA unremarkable, UDS negative.  CXR with large left pleural effusion and small right pleural effusion.  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  CT abdomen with ascending colitis constipation, small intestine wall thickening possibly related to gastroenteritis; small volume ascites; extensive abdominopelvic and inguinal adenopathy.   PCCM was consulted for bilateral pleural effusion  Pertinent  Medical History    has a past medical history of Anxiety, Chronic abdominal pain, Chronic lymphocytic leukemia (CLL), B-cell ([place]) (04/04/2016), CLL (chronic lymphocytic leukemia) ([place]), Depression with anxiety (07/07/2015), Family history of hyperlipidemia (12/14/2019), H. 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   has a past surgical history that includes Surgery of left middle finger on left hand (childhood ); Tubal ligation; Cholecystectomy; Colonoscopy (02/07/2011); Hammer toe surgery (Left); Axillary lymph node biopsy (Right, 03/28/2016); Hysteroscopy with D & C (N/A, 07/30/2016); Thoracentesis (Left, 02/26/2022); and Chest tube insertion (N/A, 03/10/2022).  Significant Hospital Events: Including procedures, antibiotic start and stop dates in addition to other pertinent events   6/2 Presented [facility] ER unresponsive, intubated, shock on pressors > to [facility].  CT chest/abd w/ extensive LAN 6/3 Right chest tube placed. Left thora 2000 ml removed - milky 6/4 Extubated, off pressors.  Pleural fluid exudative, cx negative, lymphocytic. 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 Started on pulse dose steroids '50mg'$  pred.  6/7 Chest tube output over a liter 6/8 Left thoracentesis for 1400 cc of fluid 6/10 Chest tube removed 6/12 S/p right chest tube placement with 3.1 L drained 6/3 [facility] to [facility] 6/13 for initiation of palliative radiotherapy 6/14 No chest pain. Saturation 96% on 2 L. Chest tube drained 190 cc of milky fluid 6/15 Fell. CXR stable. Doing well. Got SIM for XRT.  RA.  Mod-large effusion on L. Refused CT. Octreotide. 6/16 TPN.  On [place] O2 . R CT 1000 cc out.  Started XRT 6/15, rituximab is being considered 6/17 CT issues with placement. Not sure if she wants new chest tube.  6/18 Dislodged right chest tube completely.  6/19 PleurX deferred due to poor candidate outpatient. Bilateral chest tubes placed due to worsening respiratory distress.  6/20 - new onset hypotension after bilateral chest tube and midodrine started 6/21 2.8L out from CT in last 24 hours. IR consulted  for thoracic duct embolization. Per IR: Lymphangiogram with thoracic duct embolization is not a routine procedure and will need to see when this procedure could potentially be  performed 6/24-continues with significant output from chest tubes 6/25 - Afebrile .decreased chest tube output 6/26 - walked with PT in my presene.Bilateral chest tubes with draining chylothorax. IR uncertain (0/93/81) being STARTED SIROLIMUS  6/27- Last 90 min > 50cc drain of chyle on left sidde. Rt side - hardly any ouput.  Growing consensus that thoracic duct ligation is indicated based on curbside with Dr Kipp Brood surgeon and also running case by surgeon in [place], Dr Lorenso Courier of oncology. Started on sirolimus yesterday, She is eating, ambulating and also on TPN. No fevers D/wDr McCullohgh of IR - he has experience with TDE and will plan for procedure at [facility] or [facility] next few to several day. Needs General and a 6h block of time Patient agreeable for  TDE RT chest tube not draining well - new minor fissure fluid collection  Interim History / Subjective:   6/28 -overnight dyspneic . Now on 3L . LEft chst tube is leaking. Rt chest tube not much drain. TPN ongoing. Good appetie. IR considering TDE 04/08/2022 - patient aware   Objective   Blood pressure 112/84, pulse (!) 106, temperature 99.5 F (37.5 C), temperature source Oral, resp. rate 20, height '5\' 5"'$  (1.651 m), weight 51.5 kg, last menstrual period 07/25/2016, SpO2 93 %.        Intake/Output Summary (Last 24 hours) at 03/12/2022 1045 Last data filed at 03/12/2022 0900 Gross per 24 hour  Intake 3111.77 ml  Output 330 ml  Net 2781.77 ml   Filed Weights   03/10/22 0348 03/11/22 0418 03/11/22 1144  Weight: 49.6 kg 51.7 kg 51.5 kg    I/O last 3 completed shifts: In: 4431.6 [P.O.:600; I.V.:3831.6] Out: 1240 [Urine:750; Chest Tube:490]  Intake/Output      06/27 0701 06/28 0700 06/28 0701 06/29 0700   P.O. 360 360   I.V. (mL/kg) 2731.8 (53)    IV Piggyback 0    Chest Tube  20   Total Intake(mL/kg) 3091.8 (60) 380 (7.4)   Urine (mL/kg/hr) 100 (0.1)    Emesis/NG output     Other     Stool 0    Blood     Chest Tube 340     Total Output 440    Net +2651.8 +380        Urine Occurrence 2 x 2 x   Stool Occurrence 1 x         Physical Exam:  General Appearance:  Looks frail., lean, wasting muscle mass Head:  Normocephalic, without obvious abnormality, atraumatic Eyes:  PERRL - yes, conjunctiva/corneas - muddy     Ears:  Normal external ear canals, both ears Nose:   HAs 3 LNC Throat:  ETT TUBE - no , OG tube - no Neck:  Supple,  No enlargement/tenderness/nodules Lungs: Clear to auscultation bilaterally, BIALTERAL CHEST TUBES WITH CHYLE -LEFT chest tub leanig Heart:  S1 and S2 normal, no murmur, CVP - no.  Pressors - no Abdomen:  Soft, no masses, no organomegaly Genitalia / Rectal:  Not done Extremities:  Extremities- intact with diffuse wasign Skin:  ntact in exposed areas . Sacral area - not examined Neurologic:  Sedation - none -> RASS - +1 . Moves all 4s - yes. CAM-ICU - neg . 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 right axillary node was biopsies on 02/18/22 showing B cells consistent with her history of lymphoma and CLL  - on octreotide since 02/25/22 - on  TPN since 02/25/22 - on prednisone '50mg'$  per day since 02/18/22 - on sirolimus since 03/10/22  6/28 - worsneing hypxoemia and dyspnea. Chest tube ? Malfunctioning - left leakage and right no drain v blockage (RN able to flush though)  PLAN #For Effusions - get cxr again - - Cotninue Sirolimus high dose '2mg'$  per day  - can take weeks/months to act - COntinue Chest tube  - might need new ones dependong on cxr  - Recommended IR guided thoracic duct ligation - explained the limited literature, the general consensus, duration or procedure 4-6h under general -> she is intereested. Also, d.w Dr Soyla Dryer PA of IR - being scheduled for 04/04/2022 at [place]    #for  CLL -continue prednisone 50 mg QD - Needs immune threapy - per Dr Lorenso Courier (oral convesation 6/26) can be done as opd with Dr Tera Helper  =- seee his note 03/11/22     Hypotension  - In setting of chronic disease, profound malnutrition but onset 6/20 after chest tube  6/26 -BP adequate on midodrine  PlAN -continue midodrine   SEvere PCM  Plan  - continue TPN and oral   Best Practice:  Per TRH    > 40 min spent in care coordination   ATTESTATION & SIGNATURE    Dr. Brand Males, M.D., [facility].C.P Pulmonary and Critical Care Medicine Medical Director - [facility] ICU Staff Physician, [place] [facility] Pager: [phone], If no answer or between  15:00h - 7:00h: call [phone]  03/12/2022 10:45 AM

## 2022-03-13 ENCOUNTER — Encounter: Payer: Self-pay | Admitting: Radiation Oncology

## 2022-03-13 ENCOUNTER — Other Ambulatory Visit: Payer: Self-pay

## 2022-03-13 ENCOUNTER — Ambulatory Visit: Payer: Medicaid Other

## 2022-03-13 ENCOUNTER — Ambulatory Visit
Admit: 2022-03-13 | Discharge: 2022-03-13 | Disposition: A | Discharge status: Home / Self Care | Payer: Medicaid Other | Attending: Radiation Oncology | Admitting: Radiation Oncology

## 2022-03-13 ENCOUNTER — Other Ambulatory Visit: Payer: Self-pay | Admitting: Radiology

## 2022-03-13 ENCOUNTER — Inpatient Hospital Stay (HOSPITAL_COMMUNITY): Payer: Medicaid Other

## 2022-03-13 DIAGNOSIS — J9601 Acute respiratory failure with hypoxia: Secondary | ICD-10-CM | POA: Diagnosis not present

## 2022-03-13 DIAGNOSIS — J940 Chylous effusion: Secondary | ICD-10-CM

## 2022-03-13 LAB — CBC WITH DIFFERENTIAL/PLATELET
Abs Immature Granulocytes: 0.14 10*3/uL — ABNORMAL HIGH (ref 0.00–0.07)
Basophils Absolute: 0 10*3/uL (ref 0.0–0.1)
Basophils Relative: 0 %
Eosinophils Absolute: 0 10*3/uL (ref 0.0–0.5)
Eosinophils Relative: 0 %
HCT: 38 % (ref 36.0–46.0)
Hemoglobin: 12.3 g/dL (ref 12.0–15.0)
Immature Granulocytes: 0 %
Lymphocytes Relative: 83 %
Lymphs Abs: 42.8 10*3/uL — ABNORMAL HIGH (ref 0.7–4.0)
MCH: 30.4 pg (ref 26.0–34.0)
MCHC: 32.4 g/dL (ref 30.0–36.0)
MCV: 93.8 fL (ref 80.0–100.0)
Monocytes Absolute: 0.4 10*3/uL (ref 0.1–1.0)
Monocytes Relative: 1 %
Neutro Abs: 8.2 10*3/uL — ABNORMAL HIGH (ref 1.7–7.7)
Neutrophils Relative %: 16 %
Platelets: 205 10*3/uL (ref 150–400)
RBC: 4.05 MIL/uL (ref 3.87–5.11)
RDW: 21.1 % — ABNORMAL HIGH (ref 11.5–15.5)
WBC: 51.6 10*3/uL (ref 4.0–10.5)
nRBC: 0 % (ref 0.0–0.2)

## 2022-03-13 LAB — COMPREHENSIVE METABOLIC PANEL
ALT: 31 U/L (ref 0–44)
AST: 25 U/L (ref 15–41)
Albumin: 2.2 g/dL — ABNORMAL LOW (ref 3.5–5.0)
Alkaline Phosphatase: 68 U/L (ref 38–126)
Anion gap: 5 (ref 5–15)
BUN: 33 mg/dL — ABNORMAL HIGH (ref 8–23)
CO2: 27 mmol/L (ref 22–32)
Calcium: 7.9 mg/dL — ABNORMAL LOW (ref 8.9–10.3)
Chloride: 108 mmol/L (ref 98–111)
Creatinine, Ser: 0.32 mg/dL — ABNORMAL LOW (ref 0.44–1.00)
GFR, Estimated: >60 mL/min (ref >60)
Glucose, Bld: 135 mg/dL — ABNORMAL HIGH (ref 70–99)
Potassium: 4 mmol/L (ref 3.5–5.1)
Sodium: 140 mmol/L (ref 135–145)
Total Bilirubin: 0.2 mg/dL — ABNORMAL LOW (ref 0.3–1.2)
Total Protein: 4.8 g/dL — ABNORMAL LOW (ref 6.5–8.1)

## 2022-03-13 LAB — RAD ONC ARIA SESSION SUMMARY
Course Elapsed Days: 14
Plan Fractions Treated to Date: 5
Plan Prescribed Dose Per Fraction: 2.5 Gy
Plan Total Fractions Prescribed: 5
Plan Total Prescribed Dose: 12.5 Gy
Reference Point Dosage Given to Date: 25 Gy
Reference Point Session Dosage Given: 2.5 Gy
Session Number: 10

## 2022-03-13 LAB — GLUCOSE, CAPILLARY
Glucose-Capillary: 110 mg/dL — ABNORMAL HIGH (ref 70–99)
Glucose-Capillary: 186 mg/dL — ABNORMAL HIGH (ref 70–99)
Glucose-Capillary: 212 mg/dL — ABNORMAL HIGH (ref 70–99)
Glucose-Capillary: 152 mg/dL — ABNORMAL HIGH (ref 70–99)
Glucose-Capillary: 95 mg/dL (ref 70–99)
Glucose-Capillary: 185 mg/dL — ABNORMAL HIGH (ref 70–99)

## 2022-03-13 LAB — PHOSPHORUS: Phosphorus: 2.9 mg/dL (ref 2.5–4.6)

## 2022-03-13 LAB — TRIGLYCERIDES: Triglycerides: 59 mg/dL (ref <150)

## 2022-03-13 LAB — MAGNESIUM: Magnesium: 2 mg/dL (ref 1.7–2.4)

## 2022-03-13 LAB — LACTATE DEHYDROGENASE: LDH: 235 U/L — ABNORMAL HIGH (ref 98–192)

## 2022-03-13 MED ORDER — IOHEXOL 9 MG/ML PO SOLN
500.0000 mL | ORAL | Status: AC
  Administered 2022-03-13 (×2): 500 mL via ORAL

## 2022-03-13 MED ORDER — INSULIN ASPART 100 UNIT/ML IJ SOLN: 0.0000 [IU] | INTRAMUSCULAR | Status: DC

## 2022-03-13 MED ORDER — INSULIN GLARGINE-YFGN 100 UNIT/ML ~~LOC~~ SOLN
8.0000 [IU] | Freq: Every day | SUBCUTANEOUS | Status: DC
  Administered 2022-03-13 – 2022-03-16 (×4): 8 [IU] via SUBCUTANEOUS
  Filled 2022-03-13 (×5): qty 0.08

## 2022-03-13 MED ORDER — TRAVASOL 10 % IV SOLN: INTRAVENOUS | Status: DC

## 2022-03-13 MED ORDER — INSULIN ASPART 100 UNIT/ML IJ SOLN
0.0000 [IU] | Freq: Three times a day (TID) | INTRAMUSCULAR | Status: DC
  Administered 2022-03-13: 3 [IU] via SUBCUTANEOUS
  Administered 2022-03-14: 8 [IU] via SUBCUTANEOUS

## 2022-03-13 NOTE — Progress Notes (Signed)
TPN rate decreased to 50 mL/hr per pharmacy instructions.  Will discontinue at 12:27.  Angie Fava, RN

## 2022-03-13 NOTE — Progress Notes (Signed)
PROGRESS NOTE MARCINA KINNISON  OVF:[id number] DOB: 11/12/1960 DOA: 03/08/2022 PCP: Noreene Larsson, NP   Brief Narrative/Hospital Course: 61 year old female with a history of CLL, initially presents to the hospital with respiratory failure and bilateral pleural effusions.  She was also noted to have hypotension and concerns for shock since she required vasopressors.  She was admitted to the ICU and due to respiratory failure, required intubation.  She was eventually able to extubate and wean off of vasopressors.  For her bilateral pleural effusions, she had thoracentesis and eventually had chest tube placed bilaterally.  Fluid from chest tube was noted to be consistent with chylothorax.  She was seen by oncology, started on pulse dose steroids as well as radiation therapy for mediastinal lymphadenopathy.  Unfortunately, her chylothorax has not improved and she continues to have large amounts of output through her chest tubes.  Pulmonology is also following and she was started on sirolimus.  After several conversations between pulmonology, CVTS and interventional radiology, patient is now scheduled for thoracic duct embolization in interventional radiology coming Monday at [facility].  Assessment and Plan: Principal Problem:   Acute respiratory failure with hypoxia (HCC) Active Problems:   Sepsis ([place])   CLL (chronic lymphocytic leukemia) (HCC)   Shock ([place])   Chronic diastolic heart failure ([place])   DNR (do not resuscitate)   Protein-calorie malnutrition, severe   Pressure injury of skin   Bilateral chylothorax   Small cell B-cell lymphoma (HCC)   Leukocytosis   DM type 2 (diabetes mellitus, type 2) ([place])  Acute respiratory failure with hypoxia,Initially on vent on admit > on [place] now,stable. Bilateral chylothorax, w/ persistent output: Respiratory  failure due to bilateral chylothorax in the setting of CLL.  Had chest tube in place on rt- but 03/02/22 am-got dislodged patient reluctant for  reinsertion, subsequent implant PleurX catheter for 6/19.Patient with ongoing tachypnea having worsening respiratory disease, unable to lay flat for radiation therapy and had further discussion by PCCM underwent bilateral chest tube placement 6/19. Pleurx may not be ideal as she will not be able to take care of this at home and will have difficult placement with Pleurx.  Continues to have significant output from chest tubes.  Being managed by PCCM.  On octreotide and TPN in attempt to decrease chyle production. PCCM discussed case with Dr. Kipp Brood, CVTS who recommended IR consult for possible lymphoscintigraphy and coil embolization of the thoracic duct.  Per pulmonology, she has been started on sirolimus in the interim.  IR has now scheduled her for thoracic duct embolization on Monday at [facility] and then patient will return back to [facility] and will likely be admitted back under ICU team.  However, per PCCM, since she has been started on sirolimus, patient's appetite has improved and this morning, she was not draining anything from the right chest tube although the tube was patent.  PCCM has asked pharmacy to stop TPN and start calorie count.  Appreciate PCCM and IR help.  Shock/Hypotension poa: completed 5 days of antibiotics, no clear source of infection was identified, therefore cannot definitively rule in/rule out sepsis. Currently on midodrine for hypotension. Hemodynamics currently stable  CLL/SLL Significant leukocytosis: Oncology following> currently on prednisone 50 mg daily and XRT started 6/15. Dr. Lorenso Courier and radiation oncology team following.  Continues to have significant leukocytosis, although improved since admission.  Monitor Recent Labs  Lab 03/07/22 0525 03/09/22 0500 03/10/22 0346 03/12/22 0418 03/13/22 0902  WBC 61.6* 54.2* 44.9* 44.9* 51.6*  Chronic diastolic heart failure: Compensated, got lasix iv x1 6/17,monitor closely.Net IO Since Admission: -1,570.28 mL  [03/13/22 1309] . Filed Weights   03/10/22 0348 03/11/22 0418 03/11/22 1144  Weight: 49.6 kg 51.7 kg 51.5 kg    Hyperkalemia resolved.  Hypokalemia repleted.  T2DM, new onset A1c 6.5, w/ uncontrolled hyperglycemia- and episode of hypoglycemia while holding TPN.  Currently blood sugar stable continue Semglee 10 units, SSI, monitor. Recent Labs  Lab 03/12/22 2043 03/12/22 2356 03/13/22 0401 03/13/22 0739 03/13/22 1140  GLUCAP 276* 115* 110* 186* 212*    Protein-calorie malnutrition, severe: Augment nutritional status, cont TPN, continue supplement  Nutrition Problem: Severe Malnutrition Etiology: chronic illness (CLL) Signs/Symptoms: severe muscle depletion, severe fat depletion Interventions: Boost Breeze  Deconditioning/debility: PT OT as tolerated but limited due to chest tubes.  Goals of care  -currently DNR -palliative care following  DVT prophylaxis: enoxaparin (LOVENOX) injection 40 mg Start: 02/16/22 1515 Code Status:   Code Status: DNR Family Communication: None present Patient status is: inpatient because continues to require bilateral chest tube placement and is scheduled for procedure with IR on Monday, 04/09/2022. Level of care: Progressive   Dispo: The patient is from: Home, independent.            Anticipated disposition: Will need skilled nursing facility    Mobility Assessment (last 72 hours)     Mobility Assessment     Row Name 03/12/22 2050 03/12/22 0800 03/11/22 2007 03/10/22 2000     Does patient have an order for bedrest or is patient medically unstable No - Continue assessment No - Continue assessment No - Continue assessment No - Continue assessment    What is the highest level of mobility based on the progressive mobility assessment? -- Level 4 (Walks with assist in room) - Balance while marching in place and cannot step forward and back - Complete -- Level 4 (Walks with assist in room) - Balance while marching in place and cannot step forward and  back - Complete    Is the above level different from baseline mobility prior to current illness? -- Yes - Recommend PT order -- Yes - Recommend PT order            Subjective:  Patient seen and examined.  Once again she was eating her breakfast in the bed.  She had no complaints.  Objective: Vitals last 24 hrs: Vitals:   03/13/22 0944 03/13/22 0946 03/13/22 0949 03/13/22 1142  BP:   121/81 110/78  Pulse:    (!) 110  Resp:   (!) 44   Temp:   98.6 F (37 C) 98.2 F (36.8 C)  TempSrc:   Oral Oral  SpO2: (!) 87% 91% 95% 94%  Weight:      Height:       Weight change:   Physical Examination:  General exam: Appears calm and comfortable  Respiratory system: Slightly diminished breath sounds at the bases bilaterally, respiratory effort normal. Cardiovascular system: S1 & S2 heard, RRR. No JVD, murmurs, rubs, gallops or clicks. No pedal edema. Gastrointestinal system: Abdomen is nondistended, soft and nontender. No organomegaly or masses felt. Normal bowel sounds heard. Central nervous system: Alert and oriented. No focal neurological deficits. Extremities: Symmetric 5 x 5 power. Skin: No rashes, lesions or ulcers.  Psychiatry: Judgement and insight appear normal. Mood & affect appropriate.   Medications reviewed: Scheduled Meds:  Chlorhexidine Gluconate Cloth  6 each Topical Daily   docusate sodium  100 mg Oral BID  enoxaparin (LOVENOX) injection  40 mg Subcutaneous Q24H   feeding supplement  1 Container Oral TID BM   insulin aspart  0-15 Units Subcutaneous TID WC   insulin glargine-yfgn  8 Units Subcutaneous Daily   midodrine  5 mg Oral TID WC   octreotide  50 mcg Subcutaneous TID   polyethylene glycol  17 g Oral Daily   predniSONE  50 mg Oral Q breakfast   Sirolimus  2 mg Oral Daily   sodium chloride flush  10 mL Other Q8H   sodium chloride flush  10 mL Other Q8H   sodium chloride flush  10-40 mL Intracatheter Q12H   Continuous Infusions:  sodium chloride 10 mL/hr  at 03/12/22 1808   sodium chloride Stopped (03/09/22 0959)   [START ON 03/19/2022] cefOXitin     sodium chloride Stopped (03/07/22 1945)   Diet Order             Diet NPO time specified Except for: Sips with Meds  Diet effective midnight           Diet regular Room service appropriate? Yes; Fluid consistency: Thin  Diet effective now                    Nutrition Problem: Severe Malnutrition Etiology: chronic illness (CLL) Signs/Symptoms: severe muscle depletion, severe fat depletion Interventions: Boost Breeze   Intake/Output Summary (Last 24 hours) at 03/13/2022 1309 Last data filed at 03/13/2022 1040 Gross per 24 hour  Intake 2760.86 ml  Output 1055 ml  Net 1705.86 ml    Net IO Since Admission: -1,570.28 mL [03/13/22 1309]  Wt Readings from Last 3 Encounters:  03/11/22 51.5 kg  03/08/20 64.4 kg  02/07/20 68.5 kg     Unresulted Labs (From admission, onward)     Start     Ordered   04/11/2022 0500  CBC  Once,   R       Question:  Specimen collection method  Answer:  IV Team=IV Team collect   03/12/22 1648   03/21/2022 0500  Protime-INR  Once,   R       Question:  Specimen collection method  Answer:  IV Team=IV Team collect   03/12/22 1648   04/08/2022 1025  Basic metabolic panel  Once,   R       Question:  Specimen collection method  Answer:  IV Team=IV Team collect   03/12/22 1648   03/13/22 0851  CBC with Differential/Platelet  Daily,   R     Question:  Specimen collection method  Answer:  IV Team=IV Team collect   03/13/22 0849   03/13/22 0851  Lactate dehydrogenase  Daily,   R     Question:  Specimen collection method  Answer:  IV Team=IV Team collect   03/13/22 0849   03/04/2022 1442  Acid Fast Culture with reflexed sensitivities  (AFB smear + Culture w reflexed sensitivities panel)  Once,   R       See Hyperspace for full Linked Orders Report.   02/13/2022 1441          Data Reviewed: I have personally reviewed following labs and imaging studies CBC: Recent  Labs  Lab 03/07/22 0525 03/09/22 0500 03/10/22 0346 03/12/22 0418 03/13/22 0902  WBC 61.6* 54.2* 44.9* 44.9* 51.6*  NEUTROABS  --   --   --   --  8.2*  HGB 11.0* 11.0* 9.9* 11.4* 12.3  HCT 35.9* 33.9* 31.7* 35.7* 38.0  MCV 94.5 93.9 96.1  93.5 93.8  PLT 206 215 180 185 962    Basic Metabolic Panel: Recent Labs  Lab 03/08/22 0412 03/09/22 0500 03/10/22 0346 03/11/22 1136 03/12/22 0418 03/13/22 0333  NA 139 140 142 137 140 140  K 4.2 3.6 3.9 5.1 4.2 4.0  CL 109 112* 113* 110 107 108  CO2 '25 22 25 24 27 27  '$ GLUCOSE 109* 81 144* 134* 139* 135*  BUN 39* 39* 41* 35* 28* 33*  CREATININE 0.47 0.44 0.46 0.36* 0.32* 0.32*  CALCIUM 8.0* 8.1* 7.9* 7.9* 8.0* 7.9*  MG 2.1 1.9 2.0  --   --  2.0  PHOS 3.4 3.0 3.0  --   --  2.9    GFR: Estimated Creatinine Clearance: 60 mL/min (A) (by C-G formula based on SCr of 0.32 mg/dL (L)). Liver Function Tests: Recent Labs  Lab 03/10/22 0346 03/13/22 0333  AST 26 25  ALT 36 31  ALKPHOS 65 68  BILITOT 0.3 0.2*  PROT 4.0* 4.8*  ALBUMIN 2.1* 2.2*    No results for input(s): "LIPASE", "AMYLASE" in the last 168 hours. No results for input(s): "AMMONIA" in the last 168 hours. Coagulation Profile: No results for input(s): "INR", "PROTIME" in the last 168 hours. BNP (last 3 results) No results for input(s): "PROBNP" in the last 8760 hours. HbA1C: No results for input(s): "HGBA1C" in the last 72 hours. CBG: Recent Labs  Lab 03/12/22 2043 03/12/22 2356 03/13/22 0401 03/13/22 0739 03/13/22 1140  GLUCAP 276* 115* 110* 186* 212*    Lipid Profile: Recent Labs    03/13/22 0333  TRIG 59     Thyroid Function Tests: No results for input(s): "TSH", "T4TOTAL", "FREET4", "T3FREE", "THYROIDAB" in the last 72 hours. Sepsis Labs: No results for input(s): "PROCALCITON", "LATICACIDVEN" in the last 168 hours.  Recent Results (from the past 240 hour(s))  Body fluid culture w Gram Stain     Status: None   Collection Time: 03/04/22 10:00 AM    Specimen: Pleural Fluid  Result Value Ref Range Status   Specimen Description   Final    PLEURAL RT Performed at [place] [address], [place], [place] [address]    Special Requests   Final    NONE Performed at [facility], [place] [address] [place], [place] [address]    Gram Stain NO WBC SEEN NO ORGANISMS SEEN   Final   Culture   Final    NO GROWTH 3 DAYS Performed at [facility], [place] [address], [place], [place] [address]    Report Status 03/07/2022 FINAL  Final  Body fluid culture w Gram Stain     Status: None   Collection Time: 03/04/22 10:00 AM   Specimen: Pleural Fluid  Result Value Ref Range Status   Specimen Description   Final    PLEURAL LT Performed at [place] [address] [place], [place] [address]    Special Requests   Final    NONE Performed at [facility], [place] [address] [place], [place] [address]    Gram Stain   Final    FEW WBC PRESENT,BOTH PMN AND MONONUCLEAR NO ORGANISMS SEEN    Culture   Final    NO GROWTH 3 DAYS Performed at [facility], [place] [address], [place], [place] [address]    Report Status 03/07/2022 FINAL  Final     Antimicrobials: Anti-infectives (From admission, onward)    Start  Dose/Rate Route Frequency Ordered Stop   04/01/2022 0600  cefOXitin (MEFOXIN) 2 g in sodium chloride 0.9 % 100 mL IVPB        2 g 200 mL/hr over 30 Minutes Intravenous To Radiology 03/12/22 1655 03/18/22 0600   02/17/22 1600  metroNIDAZOLE (FLAGYL) IVPB 500 mg        500 mg 100 mL/hr over 60 Minutes Intravenous Every 12 hours 02/17/22 0713 02/18/22 1926   02/16/22 1600  ceFEPIme (MAXIPIME) 2 g in sodium chloride 0.9 % 100 mL IVPB        2 g 200 mL/hr over 30 Minutes Intravenous Every 12 hours 02/16/22 0818 02/18/22 1847   02/16/22 1500  metroNIDAZOLE (FLAGYL) IVPB 500 mg  Status:  Discontinued        500 mg 100 mL/hr over 60 Minutes Intravenous  Every 12 hours 02/16/22 1404 02/17/22 0711   02/15/22 1800  vancomycin (VANCOREADY) IVPB 1250 mg/250 mL  Status:  Discontinued        1,250 mg 166.7 mL/hr over 90 Minutes Intravenous Every 24 hours 02/13/2022 1955 02/15/22 0737   02/15/22 1415  metroNIDAZOLE (FLAGYL) IVPB 500 mg  Status:  Discontinued        500 mg 100 mL/hr over 60 Minutes Intravenous Every 12 hours 02/15/22 1325 02/16/22 0840   02/15/22 0400  ceFEPIme (MAXIPIME) 2 g in sodium chloride 0.9 % 100 mL IVPB  Status:  Discontinued        2 g 200 mL/hr over 30 Minutes Intravenous Every 8 hours 02/16/2022 1955 02/16/22 0817   03/07/2022 2000  vancomycin (VANCOREADY) IVPB 1250 mg/250 mL  Status:  Discontinued        1,250 mg 166.7 mL/hr over 90 Minutes Intravenous Every 24 hours 02/13/2022 1952 02/19/2022 1955   03/07/2022 1945  ceFEPIme (MAXIPIME) 2 g in sodium chloride 0.9 % 100 mL IVPB        2 g 200 mL/hr over 30 Minutes Intravenous  Once 03/06/2022 1931 03/03/2022 2051   03/11/2022 1945  metroNIDAZOLE (FLAGYL) IVPB 500 mg        500 mg 100 mL/hr over 60 Minutes Intravenous  Once 03/04/2022 1931 03/14/2022 2053   03/14/2022 1945  vancomycin (VANCOCIN) IVPB 1000 mg/200 mL premix  Status:  Discontinued        1,000 mg 200 mL/hr over 60 Minutes Intravenous  Once 02/28/2022 1931 03/08/2022 2053      Culture/Microbiology    Component Value Date/Time   SDES  03/04/2022 1000    PLEURAL RT Performed at [facility], [place] [address] [place], [place] [address]    SDES  03/04/2022 1000    PLEURAL LT Performed at [facility], [place] [address], [place], [place] [address]    SPECREQUEST  03/04/2022 1000    NONE Performed at [facility], [place] [address] [place], [place] [address]    SPECREQUEST  03/04/2022 1000    NONE Performed at [facility], [place] [address], [place], [place] [address]    CULT  03/04/2022 1000    NO GROWTH 3 DAYS Performed at [place] [address] [place], [place] [address]    CULT  03/04/2022 1000    NO GROWTH 3 DAYS Performed at [facility], [place] [address], [place], [place] [address]    REPTSTATUS 03/07/2022 FINAL 03/04/2022 1000   REPTSTATUS 03/07/2022 FINAL 03/04/2022 1000    Other culture-see note  Radiology Studies: DG CHEST  PORT 1 VIEW  Result Date: 03/12/2022 CLINICAL DATA:  Pleural effusion EXAM: PORTABLE CHEST 1 VIEW COMPARISON:  Multiple prior chest radiographs including most recent dated March 11, 2022 FINDINGS: The heart size and mediastinal contours are within normal limits. No significant interval change in bilateral pleural effusions with chest tubes in place. Atelectasis of the right middle lobe, unchanged. No appreciable pneumothorax. Left access PICC with distal tip in the right atrium, unchanged. Bilateral glenohumeral osteoarthritis. No acute osseous abnormality. IMPRESSION: 1. Bilateral chest tubes in place with unchanged appearance of bilateral pleural effusions and right midlung atelectasis. No appreciable pneumothorax. 2.  Stable cardiomediastinal silhouette. Electronically Signed   By: Keane Police D.O.   On: 03/12/2022 12:04     LOS: 27 days   Darliss Cheney, MD [facility]  03/13/2022, 1:09 PM

## 2022-03-13 NOTE — Progress Notes (Signed)
   03/13/22 5597  Provider Notification  Provider Name/Title Darliss Cheney, MD  Date Provider Notified 03/13/22  Time Provider Notified [phone]  Method of Notification Page  Notification Reason Critical result  Test performed and critical result WBC 51.6  Date Critical Result Received 03/13/22  Time Critical Result Received [phone]

## 2022-03-13 NOTE — Progress Notes (Signed)
PHARMACY - TOTAL PARENTERAL NUTRITION CONSULT NOTE   Indication:  bilateral chylothorax with high chest tube output  Patient Measurements: Height: '5\' 5"'$  (165.1 cm) Weight: 51.5 kg (113 lb 8.6 oz) IBW/kg (Calculated) : 57 TPN AdjBW (KG): 64 Body mass index is 18.89 kg/m.  Assessment: 61 year old female with pertinent PMH of CLL, depression, anxiety, HLD, HTN presents to [facility] ED on 6/2 unresponsive.  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 Patient also has bilateral chylothorax with high output. Pharmacy has been consulted for TPN.  Glucose / Insulin: new onset DM2, A1c 6.5  On mSSI q4h, Semglee 14 units daily - used 12 units of SSI past 24 hrs  - CBGs (goal <180) - range 110-276 - on prednisone 50 mg daily  Electrolytes: WNL including CorrCa (9.3) Renal: Scr < 1, BUN remains elevated Hepatic: LFTs WNL - TG WNL/stable - albumin low Intake / Output:  24 hr UOP: 750 mL + 4 unmeasured; Chest tube output: 155 mL (decreasing) - PO intake: 25% meal intake charted MIVF:  none  GI Imaging: 6/10 CT Chest Bilateral large pleural effusions and bulky axillary and lower cervical lymphadenopathy  GI Surgeries / Procedures: None this admission  Central access: PICC placed 6/15 TPN start date: 6/13  Nutritional Goals: Goal TPN rate is 100 mL/hr (provides 150 g of protein and 2308 kcals per day)  RD Assessment: Estimated Needs Total Energy Estimated Needs: 2300-2600 Total Protein Estimated Needs: 140-160 grams Total Fluid Estimated Needs: >2.5 L  Current Nutrition:  - Regular Low fat diet, no more than 7 g fat per meal - Boost/resource breeze TID (charted as given) - TPN at goal rate  Pt reported some nausea yesterday, vomited after lunch. Ate and tolerated dinner. Currently eating grits for breakfast.   Significant Events:  - 6/14: TPN turned off at ~10:30p for transport to [facility]. D10 started while off TPN - 6/16 am TPN off/on D10 for K 5.6, TPN resumed 1800 - 6/24:  Reduced dextrose concentration to 14% to target glucose infusion rate < 5 mg/kg/min -6/28: Inquired about plan for TPN/nutrition - will we start to wean based off of PO intake or chest tube output/symptom management?  Plan:  Per discussion with CCM, will stop TPN today. Will reduce TPN infusion to 50 mL/hr to be infused over 2 hours prior to stopping.   Discontinued all TPN associated labs and nursing orders. Change CBG checks and SSI to mSSI TID AC. Reduced Semglee to 8 units (was on this dose prior to initiation of TPN).  Lenis Noon, PharmD, BCPS Clinical Pharmacist 03/13/2022 8:56 AM

## 2022-03-13 NOTE — Progress Notes (Signed)
                                                                                                                                                             Patient Name: [name] MRN: [id number] DOB: Oct 12, 1960 Referring Physician: Demetrius Revel M Date of Service: 03/13/2022 [facility], [place]                                                        End Of Treatment Note  Diagnoses: C83.00-Small cell B-cell lymphoma, unspecified site  Cancer Staging: CLL with bulky mediastinal adenopathy resulting in chylothorax  Intent: Palliative  Radiation Treatment Dates: 02/27/2022 through 03/13/2022 Site Technique Total Dose (Gy) Dose per Fx (Gy) Completed Fx Beam Energies  Mediastinum: Chest 3D 25/25 2.5 10/10 6X, 10X, 15X   Narrative: The patient tolerated radiation therapy relatively well. She did require bilateral chest tube placement in order to manage the chylothorax.   Plan: The patient will receive a call in about one month from the radiation oncology department. She will continue follow up with Dr. Lorenso Courier as well.   ________________________________________________    Carola Rhine, [facility]

## 2022-03-13 NOTE — Progress Notes (Signed)
Patient reports an increase in appetite and PO intake on the whole.  Says she had episode of N/V yesterday at lunch, but was able to eat all of her dinner last night.  Currently eating breakfast, reporting having "good" appetite.  Angie Fava, RN

## 2022-03-13 NOTE — Progress Notes (Signed)
PT Cancellation Note  Patient Details Name: AZIZI BALLY MRN: [id number] DOB: 03-10-1961   Cancelled Treatment:    Reason Eval/Treat Not Completed: Other (comment) (Discussed with RN, will hold therapy until later date/time.)  Verner Mould, DPT Acute Rehabilitation Services Office [phone] Pager [phone]  03/13/22 3:02 PM

## 2022-03-13 NOTE — Progress Notes (Signed)
NAME:  [name], MRN:  [id number], DOB:  [birth year], LOS: 34 ADMISSION DATE:  03/09/2022, CONSULTATION DATE:  6/2 REFERRING MD:  Dr. Alvino Fry, CHIEF COMPLAINT:  Unresponsive; Acute resp failure   BRIEF  Patient is a 61 year old female with pertinent PMH of CLL, depression, anxiety, HLD, HTN presents to [facility] ED on 6/2 unresponsive.  On 6/2 patient called EMS for abdominal pain.  Patient initially was alert and talking but became unresponsive with AMS.  Patient was hypoxic with agonal breathing and was placed on NRB.  Patient was noted to be hypotensive.  Patient transported to [facility] ED.  Upon arrival to [facility] ED patient remained unresponsive with agonal respirations.  Patient intubated for airway protection and hypoxia.  BP initially 81/65.  Given IV fluids.  Started on levo.  Fever 100.9 F and WBC 104.4.  Started on cefepime, Flagyl, Vanco.  Cultures pending.  Glucose 198, BNP 534, troponin 109 then 112, LA 4.4 then 1.8, UA unremarkable, UDS negative.  CXR with large left pleural effusion and small right pleural effusion.  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  CT abdomen with ascending colitis constipation, small intestine wall thickening possibly related to gastroenteritis; small volume ascites; extensive abdominopelvic and inguinal adenopathy.   PCCM was consulted for bilateral pleural effusion  Pertinent  Medical History    has a past medical history of Anxiety, Chronic abdominal pain, Chronic lymphocytic leukemia (CLL), B-cell ([name]) (04/04/2016), CLL (chronic lymphocytic leukemia) ([name]), Depression with anxiety (07/07/2015), Family history of hyperlipidemia (12/14/2019), H. 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   has a past surgical history that includes Surgery of left middle finger on left hand (childhood ); Tubal ligation; Cholecystectomy; Colonoscopy (02/07/2011); Hammer toe surgery (Left); Axillary lymph node biopsy (Right, 03/28/2016); Hysteroscopy with D & C (N/A, 07/30/2016); Thoracentesis (Left, 03/13/2022); and Chest tube insertion (N/A, 03/07/2022).  Significant Hospital Events: Including procedures, antibiotic start and stop dates in addition to other pertinent events   6/2 Presented [facility] ER unresponsive, intubated, shock on pressors > to [facility].  CT chest/abd w/ extensive LAN 6/3 Right chest tube placed. Left thora 2000 ml removed - milky 6/4 Extubated, off pressors.  Pleural fluid exudative, cx negative, lymphocytic. 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 Started on pulse dose steroids '50mg'$  pred.  6/7 Chest tube output over a liter 6/8 Left thoracentesis for 1400 cc of fluid 6/10 Chest tube removed 6/12 S/p right chest tube placement with 3.1 L drained 6/3 Tx from [facility] to [facility] 6/13 for initiation of palliative radiotherapy 6/14 No chest pain. Saturation 96% on 2 L. Chest tube drained 190 cc of milky fluid 6/15 Fell. CXR stable. Doing well. Got SIM for XRT.  RA.  Mod-large effusion on L. Refused CT. Octreotide. 6/16 TPN.  On [name] . R CT 1000 cc out.  Started XRT 6/15, rituximab is being considered 6/17 CT issues with placement. Not sure if she wants new chest tube.  6/18 Dislodged right chest tube completely.  6/19 PleurX deferred due to poor candidate outpatient. Bilateral chest tubes placed due to worsening respiratory distress.  6/20 - new onset hypotension after bilateral chest tube and midodrine started 6/21 2.8L out from CT in last 24 hours. IR consulted  for thoracic duct embolization. Per IR: Lymphangiogram with thoracic duct embolization is not a routine procedure and will need to see when this procedure could potentially be  performed 6/24-continues with significant output from chest tubes 6/25 - Afebrile .decreased chest tube output 6/26 - walked with PT in my presene.Bilateral chest tubes with draining chylothorax. IR uncertain (9/37/16) being STARTED SIROLIMUS  6/27- Last 90 min > 50cc drain of chyle on left sidde. Rt side - hardly any ouput.  Growing consensus that thoracic duct ligation is indicated based on curbside with Dr [name] surgeon and also running case by surgeon in [place], Dr [name] of oncology. Started on sirolimus yesterday, She is eating, ambulating and also on TPN. No fevers D/wDr [name] of IR - he has experience with TDE and will plan for procedure at [facility] or [facility] next few to several day. Needs General and a 6h block of time Patient agreeable for  TDE RT chest tube not draining well - new minor fissure fluid collection 6/28 -overnight dyspneic . Now on 3L [place]. LEft chst tube is leaking. Rt chest tube not much drain. TPN ongoing. Good appetie. IR considering TDE 03/23/2022 - patient aware  Interim History / Subjective:    03/13/22 - afebrile v low grade fever. . WBC 51K v   - -   PEr RN -  L chest tube has had diminished output.-  very little output overnight and saturation of bandage with fluid.  Total 50 cc of fluid since 1900 yesterday.  Right chest tube: NO ouput. Both tubes flush well. Left with leakage around - Room air pulse ox 87%, 2L [place] >  94%. RN notices  - also c/o of abd pain today but says worse when lying down  x few weeks . Eating well with very good appetite - TPN - will stop  Objective   Blood pressure 111/69, pulse (!) 104, temperature 98.6 F (37 C), temperature source Oral, resp. rate 20, height '5\' 5"'$  (1.651 m), weight 51.5 kg, last menstrual period 07/25/2016, SpO2 91 %.        Intake/Output Summary (Last 24 hours) at 03/13/2022 0912 Last data filed at 03/13/2022 0744 Gross per 24 hour  Intake 2560.86 ml  Output 1005 ml  Net 1555.86 ml   Filed Weights   03/10/22  0348 03/11/22 0418 03/11/22 1144  Weight: 49.6 kg 51.7 kg 51.5 kg    I/O last 3 completed shifts: In: 5652.6 [P.O.:360; I.V.:5252.6] Out: 1005 [Urine:850; Chest Tube:155]  Intake/Output      06/28 0701 06/29 0700 06/29 0701 06/30 0700   P.O. 360    I.V. (mL/kg) 2540.9 (49.3)    IV Piggyback 0    Chest Tube 40    Total Intake(mL/kg) 2940.9 (57.1)    Urine (mL/kg/hr) 750 (0.6) 100 (0.9)   Stool     Chest Tube 155    Total Output 905 100   Net +2035.9 -100        Urine Occurrence 4 x     General Appearance:  looks a bit stronger. Sitting and eating. Needing 2L [place] Head:  Normocephalic, without obvious abnormality, atraumatic Eyes:  PERRL - yes, conjunctiva/corneas - muddy     Ears:  Normal external ear canals, both ears Nose:  G tube - no but has [place] Throat:  ETT TUBE - no , OG tube - no0 Neck:  Supple,  No enlargement/tenderness/nodules Lungs: Clear to auscultation bilaterally, Bialterl Chest tube RT not draining, Left not draiinng - both can  be flushed Heart:  S1 and S2 normal, no murmur, CVP - no.  Pressors - no Abdomen:  Soft, no masses, no organomegaly Genitalia / Rectal:  Not done Extremities:  Extremities- intact Skin:  ntact in exposed areas . Sacral area - none Neurologic:  Sedation - none -> RASS - +1 . Moves all 4s - ye. CAM-ICU - neg . 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 right axillary node was biopsies on 02/18/22 showing B cells consistent with her history of lymphoma and CLL  - on octreotide since 02/25/22 - on  TPN since 02/25/22 - on prednisone '50mg'$  per day since 02/18/22 - on sirolimus since 03/10/22  6/29 - reduced chest tube output 1 day after sirolimus . ? Due to sirolimus or chest tube malposition (they both flush well, CXR getting wrose and left chest tube leaks).     PLAN #For  Effusions - get CT chest wo contrast to eval chest tube position and return of effusion - - Cotninue Sirolimus high dose '2mg'$  per day  - can take weeks/months to act - COntinue Chest tube for now  - might need new ones dependong on cxr but depends on CT  - Recommended IR guided thoracic duct ligation - explained the limited literature, the general consensus, duration or procedure 4-6h under general -> she is intereested. Also, d.w Dr Soyla Dryer PA of IR - being scheduled for 03/22/2022 at [facility]  - if effusions truly responding to sirolimus ? Hold off on TDE  - o2 for goa pulse ox > 94%  #for CLL -continue prednisone 50 mg QD - Needs immune threapy - per Dr [name] (oral convesation 6/26) can be done as opd with Dr Tera Helper  =- seee his note 03/11/22     Hypotension  - In setting of chronic disease, profound malnutrition but onset 6/20 after chest tube  6/26 -BP adequate on midodrine  PlAN -continue midodrine   SEvere PCM  6/29 - eatin gwell  Plan  - dc TPN  Do calorie count  ABd pain x few weeks  6/29 - she is reporting it now but says been present even before sirolimus  Plan  - abd ct    Best Practice:  Per TRH    > 40 min spent in care coordination  D/w [facility] MD   ATTESTATION & SIGNATURE    Dr. Brand Males, M.D., [facility]C.P Pulmonary and Critical Care Medicine Medical Director - [facility] ICU Staff Physician, [place] [facility] Pager: [phone], If no answer or between  15:00h - 7:00h: call [phone]  03/13/2022 9:12 AM

## 2022-03-13 NOTE — Progress Notes (Signed)
   03/13/22 0949  Assess: MEWS Score  Temp 98.6 F (37 C)  BP 121/81  MAP (mmHg) 94  ECG Heart Rate (!) 117  Resp (!) 44  Level of Consciousness Alert  SpO2 95 %  O2 Device Nasal Cannula  O2 Flow Rate (L/min) 2 L/min  Assess: MEWS Score  MEWS Temp 0  MEWS Systolic 0  MEWS Pulse 2  MEWS RR 3  MEWS LOC 0  MEWS Score 5  MEWS Score Color Red  Treat  Pain Scale 0-10  Pain Score 8  Pain Type Acute pain;Surgical pain  Pain Location Rib cage  Pain Orientation Right;Left;Anterior;Posterior;Lower  Pain Radiating Towards back, abdomen  Pain Descriptors / Indicators Aching;Sharp  Pain Frequency Constant  Pain Onset On-going  Patients Stated Pain Goal 5  Pain Intervention(s) Medication (See eMAR)  Multiple Pain Sites No  Take Vital Signs  Increase Vital Sign Frequency  Red: Q 1hr X 4 then Q 4hr X 4, if remains red, continue Q 4hrs  Escalate  MEWS: Escalate Red: discuss with charge nurse/RN and provider, consider discussing with RRT  Notify: Charge Nurse/RN  Name of Charge Nurse/RN Notified Russ Halo, RN  Date Charge Nurse/RN Notified 03/13/22  Time Charge Nurse/RN Notified 1044  Notify: Provider  Provider Name/Title Darliss Cheney, MD  Date Provider Notified 03/13/22  Time Provider Notified 1044  Method of Notification  (secure chat)  Notification Reason Change in status  Provider response See new orders  Date of Provider Response 03/13/22  Time of Provider Response 1045  Assess: SIRS CRITERIA  SIRS Temperature  0  SIRS Pulse 1  SIRS Respirations  1  SIRS WBC 1  SIRS Score Sum  3

## 2022-03-13 NOTE — Progress Notes (Signed)
NUTRITION NOTE  Full follow-up assessment completed 6/27. Consult received for 48 hour Calorie Count.   RD will follow-up 6/30 and 7/3 for results.    Lori Matin, MS, RD, LDN, [place] Registered Dietitian II Inpatient Clinical Nutrition RD pager # and on-call/weekend pager # available in [facility]

## 2022-03-13 NOTE — Progress Notes (Signed)
Calorie count begun with breakfast.  Angie Fava, RN

## 2022-03-14 ENCOUNTER — Other Ambulatory Visit (HOSPITAL_COMMUNITY): Payer: Self-pay

## 2022-03-14 ENCOUNTER — Inpatient Hospital Stay (HOSPITAL_COMMUNITY): Payer: Medicaid Other

## 2022-03-14 ENCOUNTER — Other Ambulatory Visit (HOSPITAL_COMMUNITY): Payer: Self-pay | Admitting: Physician Assistant

## 2022-03-14 DIAGNOSIS — J9601 Acute respiratory failure with hypoxia: Secondary | ICD-10-CM | POA: Diagnosis not present

## 2022-03-14 DIAGNOSIS — J940 Chylous effusion: Secondary | ICD-10-CM | POA: Diagnosis not present

## 2022-03-14 LAB — CBC WITH DIFFERENTIAL/PLATELET
Abs Immature Granulocytes: 0.07 10*3/uL (ref 0.00–0.07)
Basophils Absolute: 0.1 10*3/uL (ref 0.0–0.1)
Basophils Relative: 0 %
Eosinophils Absolute: 0 10*3/uL (ref 0.0–0.5)
Eosinophils Relative: 0 %
HCT: 35.3 % — ABNORMAL LOW (ref 36.0–46.0)
Hemoglobin: 11.4 g/dL — ABNORMAL LOW (ref 12.0–15.0)
Immature Granulocytes: 0 %
Lymphocytes Relative: 83 %
Lymphs Abs: 31.7 10*3/uL — ABNORMAL HIGH (ref 0.7–4.0)
MCH: 30.2 pg (ref 26.0–34.0)
MCHC: 32.3 g/dL (ref 30.0–36.0)
MCV: 93.4 fL (ref 80.0–100.0)
Monocytes Absolute: 0.2 10*3/uL (ref 0.1–1.0)
Monocytes Relative: 1 %
Neutro Abs: 6.3 10*3/uL (ref 1.7–7.7)
Neutrophils Relative %: 16 %
Platelets: 187 10*3/uL (ref 150–400)
RBC: 3.78 MIL/uL — ABNORMAL LOW (ref 3.87–5.11)
RDW: 20.5 % — ABNORMAL HIGH (ref 11.5–15.5)
WBC: 38.4 10*3/uL — ABNORMAL HIGH (ref 4.0–10.5)
nRBC: 0.1 % (ref 0.0–0.2)

## 2022-03-14 LAB — GLUCOSE, CAPILLARY
Glucose-Capillary: 84 mg/dL (ref 70–99)
Glucose-Capillary: 81 mg/dL (ref 70–99)
Glucose-Capillary: 261 mg/dL — ABNORMAL HIGH (ref 70–99)
Glucose-Capillary: 188 mg/dL — ABNORMAL HIGH (ref 70–99)
Glucose-Capillary: 59 mg/dL — ABNORMAL LOW (ref 70–99)
Glucose-Capillary: 82 mg/dL (ref 70–99)
Glucose-Capillary: 163 mg/dL — ABNORMAL HIGH (ref 70–99)

## 2022-03-14 LAB — LACTATE DEHYDROGENASE: LDH: 217 U/L — ABNORMAL HIGH (ref 98–192)

## 2022-03-14 MED ORDER — BISACODYL 10 MG RE SUPP
10.0000 mg | Freq: Once | RECTAL | Status: AC
  Administered 2022-03-14: 10 mg via RECTAL
  Filled 2022-03-14: qty 1

## 2022-03-14 MED ORDER — INSULIN ASPART 100 UNIT/ML IJ SOLN: 0.0000 [IU] | Freq: Every day | INTRAMUSCULAR | Status: DC

## 2022-03-14 MED ORDER — SODIUM CHLORIDE (PF) 0.9 % IJ SOLN
10.0000 mg | Freq: Once | INTRAMUSCULAR | Status: AC
  Administered 2022-03-14: 10 mg via INTRAPLEURAL
  Filled 2022-03-14: qty 10

## 2022-03-14 MED ORDER — SODIUM CHLORIDE 0.9 % IV SOLN: INTRAVENOUS | Status: DC

## 2022-03-14 MED ORDER — ADULT MULTIVITAMIN W/MINERALS CH
1.0000 | ORAL_TABLET | Freq: Every day | ORAL | Status: DC
  Administered 2022-03-14 – 2022-03-16 (×3): 1 via ORAL
  Filled 2022-03-14 (×3): qty 1

## 2022-03-14 MED ORDER — ACETAMINOPHEN 325 MG PO TABS
650.0000 mg | ORAL_TABLET | ORAL | Status: DC | PRN
  Administered 2022-03-14 – 2022-03-26 (×5): 650 mg via ORAL
  Filled 2022-03-14 (×4): qty 2

## 2022-03-14 MED ORDER — SODIUM CHLORIDE 0.9% FLUSH: 10.0000 mL | Freq: Three times a day (TID) | INTRAVENOUS | Status: DC

## 2022-03-14 MED ORDER — INSULIN ASPART 100 UNIT/ML IJ SOLN: 0.0000 [IU] | Freq: Three times a day (TID) | INTRAMUSCULAR | Status: DC

## 2022-03-14 MED ORDER — HYDROCERIN EX CREA
TOPICAL_CREAM | Freq: Every day | CUTANEOUS | Status: DC
Start: 2022-03-14 — End: 2022-03-26
  Filled 2022-03-14 (×2): qty 113

## 2022-03-14 MED ORDER — SODIUM CHLORIDE 0.9 % IV SOLN
12.5000 mg | Freq: Four times a day (QID) | INTRAVENOUS | Status: DC | PRN
  Administered 2022-03-14 – 2022-03-21 (×5): 12.5 mg via INTRAVENOUS
  Filled 2022-03-14 (×5): qty 12.5

## 2022-03-14 MED ORDER — ENSURE MAX PROTEIN PO LIQD
11.0000 [oz_av] | Freq: Every day | ORAL | Status: DC
  Administered 2022-03-14 – 2022-03-25 (×11): 11 [oz_av] via ORAL
  Filled 2022-03-14 (×17): qty 330

## 2022-03-14 NOTE — Progress Notes (Addendum)
Chest tube output 6/30 1100-1800: 2350 cc milky/pink fluid w/ clots.  Patient c/o aching, sharp 7/10-9/10 pain at chest tube insertion sites, radiating to back and abdomen (consistent with what she reported to me yesterday).  Requesting PRN medications throughout shift (oxycodone, morphine, Fentanyl).  Last reported BM 6/28.  Left chest tube set to water seal at 1820, right chest tube set to 20 mmHg suction per PCCM instructions.  Angie Fava, RN

## 2022-03-14 NOTE — Consult Note (Addendum)
[facility] Nurse Consult Note: Reason for Consult: Consult requested for bilat feet, ingrown toenails, posterior back and sacrum. Pt has multiple systemic factors which can impair healing.  Secure chat message sent to the primary team: "The [place] team has been consulted for ingrown toenails. We are not qualified to perform nail care; it is beyond our scope of practice.  Please request a podiatry consult if desired.  Thank-you."  Wound type: Sacrum with Stage 2 pressure injury; pink and dry, 2X1X.1cm Left posterior back with black dry raised lesion; .5X.5cm, no drainage or fluctuance; unknown etiology, atypical appearance. There is no inpatient dermatology available at [facility]; Recommend pt follow-up with dermatology after discharge for possible biopsy and plan of care. Right upper back with Stage 2 pressure injury, pink and dry, .8X.8X.1cm  Pressure Injury POA: No  Bilat feet and lower legs with dry scaley skin. Toe nails are significantly long and ingrown; curling up underneath plantar feet surfaces. Assessed webbing areas between toes with a swab abd there are no open wounds.  Bilat outer feet and heels with dry yellow raised callous areas.  Dressing procedure/placement/frequency: Topical treatment orders provided for bedside nurses to perform as follows:  Apply Eucerin cream to bilat legs and feet Q day after bathing and roughly towel-drying to assist with removal of nonviable tissue. Foam dressing to left back, right back and sacrum wounds.  Change Q 3 days or PRN soiling. Please re-consult if further assistance is needed.  Thank-you,  Julien Girt MSN, [place], [place], [place], [place]

## 2022-03-14 NOTE — Progress Notes (Signed)
[facility] transport set up for patient to arrive at [place] Stay at 0600 Monday, July 3 per PA orders.  Angie Fava, RN

## 2022-03-14 NOTE — Progress Notes (Signed)
PROGRESS NOTE [name]  WJX:[id number] DOB: 01-13-1961 DOA: 02/23/2022 PCP: Noreene Larsson, NP   Brief Narrative/Hospital Course: 61 year old female with a history of CLL, initially presents to the hospital with respiratory failure and bilateral pleural effusions.  She was also noted to have hypotension and concerns for shock since she required vasopressors.  She was admitted to the ICU and due to respiratory failure, required intubation.  She was eventually able to extubate and wean off of vasopressors.  For her bilateral pleural effusions, she had thoracentesis and eventually had chest tube placed bilaterally.  Fluid from chest tube was noted to be consistent with chylothorax.  She was seen by oncology, started on pulse dose steroids as well as radiation therapy for mediastinal lymphadenopathy.  Unfortunately, her chylothorax has not improved and she continues to have large amounts of output through her chest tubes.  Pulmonology is also following and she was started on sirolimus.  After several conversations between pulmonology, CVTS and interventional radiology, patient is now scheduled for thoracic duct embolization in interventional radiology coming Monday at [facility].  Assessment and Plan: Principal Problem:   Acute respiratory failure with hypoxia (HCC) Active Problems:   Sepsis ([place])   CLL (chronic lymphocytic leukemia) (HCC)   Shock ([place])   Chronic diastolic heart failure ([place])   DNR (do not resuscitate)   Protein-calorie malnutrition, severe   Pressure injury of skin   Bilateral chylothorax   Small cell B-cell lymphoma (HCC)   Leukocytosis   DM type 2 (diabetes mellitus, type 2) ([place])  Acute respiratory failure with hypoxia,Initially on vent on admit > on [place] now,stable. Bilateral chylothorax, w/ persistent output: Respiratory  failure due to bilateral chylothorax in the setting of CLL.  Had chest tube in place on rt- but 03/02/22 am-got dislodged patient reluctant for  reinsertion, subsequent implant PleurX catheter for 6/19.Patient with ongoing tachypnea having worsening respiratory disease, unable to lay flat for radiation therapy and had further discussion by PCCM underwent bilateral chest tube placement 6/19. Pleurx may not be ideal as she will not be able to take care of this at home and will have difficult placement with Pleurx.  Continues to have significant output from chest tubes.  Being managed by PCCM.  On octreotide and TPN in attempt to decrease chyle production. PCCM discussed case with Dr. Kipp Brood, CVTS who recommended IR consult for possible lymphoscintigraphy and coil embolization of the thoracic duct.  Per pulmonology, she has been started on sirolimus in the interim.  IR has now scheduled her for thoracic duct embolization on Monday at [facility] and then patient will return back to [facility] and will likely be admitted back under ICU team.  However, per PCCM, since she has been started on sirolimus, patient's appetite has improved and yesterday morning, she was not draining anything from the right chest tube although the tube was patent.  PCCM asked to stop TPN and start calorie count.  Appreciate PCCM and IR help.  Shock/Hypotension poa: completed 5 days of antibiotics, no clear source of infection was identified, therefore cannot definitively rule in/rule out sepsis. Currently on midodrine for hypotension. Hemodynamics currently stable  CLL/SLL Significant leukocytosis: Oncology following> currently on prednisone 50 mg daily and XRT started 6/15. Dr. Lorenso Courier and radiation oncology team following.  Continues to have significant leukocytosis, although improved since admission.  Monitor Recent Labs  Lab 03/09/22 0500 03/10/22 0346 03/12/22 0418 03/13/22 0902 03/14/22 0324  WBC 54.2* 44.9* 44.9* 51.6* 38.4*  Chronic diastolic heart failure: Compensated, got lasix iv x1 6/17,monitor closely.Net IO Since Admission: -2,520.28 mL  [03/14/22 1135] . Filed Weights   03/10/22 0348 03/11/22 0418 03/11/22 1144  Weight: 49.6 kg 51.7 kg 51.5 kg    Hyperkalemia resolved.  Hypokalemia repleted.  T2DM, new onset A1c 6.5, w/ uncontrolled hyperglycemia- and episode of hypoglycemia while holding TPN.  Currently blood sugar stable continue Semglee 10 units, SSI, monitor. Recent Labs  Lab 03/13/22 1341 03/13/22 1733 03/13/22 2024 03/14/22 0630 03/14/22 0750  GLUCAP 152* 95 185* 84 81    Protein-calorie malnutrition, severe: Augment nutritional status, cont TPN, continue supplement  Nutrition Problem: Severe Malnutrition Etiology: chronic illness (CLL) Signs/Symptoms: severe muscle depletion, severe fat depletion Interventions: Boost Breeze  Deconditioning/debility: PT OT as tolerated but limited due to chest tubes.  Goals of care  -currently DNR -palliative care following  DVT prophylaxis: enoxaparin (LOVENOX) injection 40 mg Start: 02/16/22 1515 Code Status:   Code Status: DNR Family Communication: None present Patient status is: inpatient because continues to require bilateral chest tube placement and is scheduled for procedure with IR on Monday, 04/06/2022. Level of care: Progressive   Dispo: The patient is from: Home, independent.            Anticipated disposition: Will need skilled nursing facility    Mobility Assessment (last 72 hours)     Mobility Assessment     Row Name 03/13/22 1935 03/13/22 1850 03/12/22 2050 03/12/22 0800 03/11/22 2007   Does patient have an order for bedrest or is patient medically unstable No - Continue assessment No - Continue assessment No - Continue assessment No - Continue assessment No - Continue assessment   What is the highest level of mobility based on the progressive mobility assessment? -- Level 2 (Chairfast) - Balance while sitting on edge of bed and cannot stand -- Level 4 (Walks with assist in room) - Balance while marching in place and cannot step forward and back -  Complete --   Is the above level different from baseline mobility prior to current illness? -- Yes - Recommend PT order -- Yes - Recommend PT order --           Subjective:  Seen and examined.  Patient states that she feels much better.  She states that her appetite is improving.  No shortness of breath.  Objective: Vitals last 24 hrs: Vitals:   03/14/22 0118 03/14/22 0419 03/14/22 0808 03/14/22 0932  BP: 108/72 117/78 120/76 113/77  Pulse: 98 (!) 102 (!) 117 (!) 112  Resp: 18 18 (!) 40 (!) 30  Temp: 98.9 F (37.2 C) 99.7 F (37.6 C) 98.3 F (36.8 C) 99.6 F (37.6 C)  TempSrc: Oral Oral Oral Oral  SpO2: 94% (!) 89% 93% 94%  Weight:      Height:       Weight change:   Physical Examination:  General exam: Appears calm and comfortable  Respiratory system: Diminished breath sounds at the bases bilaterally. Respiratory effort normal.  Bilateral chest tube in place. Cardiovascular system: S1 & S2 heard, RRR. No JVD, murmurs, rubs, gallops or clicks. No pedal edema. Gastrointestinal system: Abdomen is nondistended, soft and nontender. No organomegaly or masses felt. Normal bowel sounds heard. Central nervous system: Alert and oriented. No focal neurological deficits. Extremities: Symmetric 5 x 5 power. Skin: No rashes, lesions or ulcers.  Psychiatry: Judgement and insight appear normal. Mood & affect appropriate.   Medications reviewed: Scheduled Meds:  Chlorhexidine Gluconate Cloth  6  each Topical Daily   docusate sodium  100 mg Oral BID   enoxaparin (LOVENOX) injection  40 mg Subcutaneous Q24H   feeding supplement  1 Container Oral TID BM   insulin aspart  0-15 Units Subcutaneous TID WC   insulin glargine-yfgn  8 Units Subcutaneous Daily   midodrine  5 mg Oral TID WC   octreotide  50 mcg Subcutaneous TID   polyethylene glycol  17 g Oral Daily   predniSONE  50 mg Oral Q breakfast   Sirolimus  2 mg Oral Daily   sodium chloride flush  10 mL Other Q8H   sodium chloride  flush  10 mL Other Q8H   sodium chloride flush  10 mL Other Q8H   sodium chloride flush  10-40 mL Intracatheter Q12H   Continuous Infusions:  sodium chloride 10 mL/hr at 03/12/22 1808   sodium chloride Stopped (03/09/22 0959)   [START ON 04/12/2022] cefOXitin     sodium chloride Stopped (03/07/22 1945)   Diet Order             Diet NPO time specified Except for: Sips with Meds  Diet effective midnight           Diet regular Room service appropriate? Yes; Fluid consistency: Thin  Diet effective now                    Nutrition Problem: Severe Malnutrition Etiology: chronic illness (CLL) Signs/Symptoms: severe muscle depletion, severe fat depletion Interventions: Boost Breeze   Intake/Output Summary (Last 24 hours) at 03/14/2022 1135 Last data filed at 03/14/2022 1110 Gross per 24 hour  Intake 1360 ml  Output 2310 ml  Net -950 ml    Net IO Since Admission: -2,520.28 mL [03/14/22 1135]  Wt Readings from Last 3 Encounters:  03/11/22 51.5 kg  03/08/20 64.4 kg  02/07/20 68.5 kg     Unresulted Labs (From admission, onward)     Start     Ordered   04/03/2022 0500  CBC  Once,   R       Question:  Specimen collection method  Answer:  IV Team=IV Team collect   03/12/22 1648   04/11/2022 0500  Protime-INR  Once,   R       Question:  Specimen collection method  Answer:  IV Team=IV Team collect   03/12/22 1648   04/07/2022 9924  Basic metabolic panel  Once,   R       Question:  Specimen collection method  Answer:  IV Team=IV Team collect   03/12/22 1648   03/13/22 0851  CBC with Differential/Platelet  Daily,   R     Question:  Specimen collection method  Answer:  IV Team=IV Team collect   03/13/22 0849   03/13/22 0851  Lactate dehydrogenase  Daily,   R     Question:  Specimen collection method  Answer:  IV Team=IV Team collect   03/13/22 0849   03/09/2022 1442  Acid Fast Culture with reflexed sensitivities  (AFB smear + Culture w reflexed sensitivities panel)  Once,   R        See Hyperspace for full Linked Orders Report.   03/10/2022 1441          Data Reviewed: I have personally reviewed following labs and imaging studies CBC: Recent Labs  Lab 03/09/22 0500 03/10/22 0346 03/12/22 0418 03/13/22 0902 03/14/22 0324  WBC 54.2* 44.9* 44.9* 51.6* 38.4*  NEUTROABS  --   --   --  8.2* 6.3  HGB 11.0* 9.9* 11.4* 12.3 11.4*  HCT 33.9* 31.7* 35.7* 38.0 35.3*  MCV 93.9 96.1 93.5 93.8 93.4  PLT 215 180 185 205 660    Basic Metabolic Panel: Recent Labs  Lab 03/08/22 0412 03/09/22 0500 03/10/22 0346 03/11/22 1136 03/12/22 0418 03/13/22 0333  NA 139 140 142 137 140 140  K 4.2 3.6 3.9 5.1 4.2 4.0  CL 109 112* 113* 110 107 108  CO2 '25 22 25 24 27 27  '$ GLUCOSE 109* 81 144* 134* 139* 135*  BUN 39* 39* 41* 35* 28* 33*  CREATININE 0.47 0.44 0.46 0.36* 0.32* 0.32*  CALCIUM 8.0* 8.1* 7.9* 7.9* 8.0* 7.9*  MG 2.1 1.9 2.0  --   --  2.0  PHOS 3.4 3.0 3.0  --   --  2.9    GFR: Estimated Creatinine Clearance: 60 mL/min (A) (by C-G formula based on SCr of 0.32 mg/dL (L)). Liver Function Tests: Recent Labs  Lab 03/10/22 0346 03/13/22 0333  AST 26 25  ALT 36 31  ALKPHOS 65 68  BILITOT 0.3 0.2*  PROT 4.0* 4.8*  ALBUMIN 2.1* 2.2*    No results for input(s): "LIPASE", "AMYLASE" in the last 168 hours. No results for input(s): "AMMONIA" in the last 168 hours. Coagulation Profile: No results for input(s): "INR", "PROTIME" in the last 168 hours. BNP (last 3 results) No results for input(s): "PROBNP" in the last 8760 hours. HbA1C: No results for input(s): "HGBA1C" in the last 72 hours. CBG: Recent Labs  Lab 03/13/22 1341 03/13/22 1733 03/13/22 2024 03/14/22 0630 03/14/22 0750  GLUCAP 152* 95 185* 84 81    Lipid Profile: Recent Labs    03/13/22 0333  TRIG 59     Thyroid Function Tests: No results for input(s): "TSH", "T4TOTAL", "FREET4", "T3FREE", "THYROIDAB" in the last 72 hours. Sepsis Labs: No results for input(s): "PROCALCITON",  "LATICACIDVEN" in the last 168 hours.  No results found for this or any previous visit (from the past 240 hour(s)).    Antimicrobials: Anti-infectives (From admission, onward)    Start     Dose/Rate Route Frequency Ordered Stop   03/16/2022 0600  cefOXitin (MEFOXIN) 2 g in sodium chloride 0.9 % 100 mL IVPB        2 g 200 mL/hr over 30 Minutes Intravenous To Radiology 03/12/22 1655 03/18/22 0600   02/17/22 1600  metroNIDAZOLE (FLAGYL) IVPB 500 mg        500 mg 100 mL/hr over 60 Minutes Intravenous Every 12 hours 02/17/22 0713 02/18/22 1926   02/16/22 1600  ceFEPIme (MAXIPIME) 2 g in sodium chloride 0.9 % 100 mL IVPB        2 g 200 mL/hr over 30 Minutes Intravenous Every 12 hours 02/16/22 0818 02/18/22 1847   02/16/22 1500  metroNIDAZOLE (FLAGYL) IVPB 500 mg  Status:  Discontinued        500 mg 100 mL/hr over 60 Minutes Intravenous Every 12 hours 02/16/22 1404 02/17/22 0711   02/15/22 1800  vancomycin (VANCOREADY) IVPB 1250 mg/250 mL  Status:  Discontinued        1,250 mg 166.7 mL/hr over 90 Minutes Intravenous Every 24 hours 02/15/2022 1955 02/15/22 0737   02/15/22 1415  metroNIDAZOLE (FLAGYL) IVPB 500 mg  Status:  Discontinued        500 mg 100 mL/hr over 60 Minutes Intravenous Every 12 hours 02/15/22 1325 02/16/22 0840   02/15/22 0400  ceFEPIme (MAXIPIME) 2 g in sodium chloride 0.9 % 100 mL IVPB  Status:  Discontinued        2 g 200 mL/hr over 30 Minutes Intravenous Every 8 hours 03/02/2022 1955 02/16/22 0817   02/23/2022 2000  vancomycin (VANCOREADY) IVPB 1250 mg/250 mL  Status:  Discontinued        1,250 mg 166.7 mL/hr over 90 Minutes Intravenous Every 24 hours 02/13/2022 1952 03/04/2022 1955   03/01/2022 1945  ceFEPIme (MAXIPIME) 2 g in sodium chloride 0.9 % 100 mL IVPB        2 g 200 mL/hr over 30 Minutes Intravenous  Once 03/10/2022 1931 02/19/2022 2051   02/17/2022 1945  metroNIDAZOLE (FLAGYL) IVPB 500 mg        500 mg 100 mL/hr over 60 Minutes Intravenous  Once 02/19/2022 1931 03/11/2022 2053    02/25/2022 1945  vancomycin (VANCOCIN) IVPB 1000 mg/200 mL premix  Status:  Discontinued        1,000 mg 200 mL/hr over 60 Minutes Intravenous  Once 02/23/2022 1931 03/06/2022 2053      Culture/Microbiology    Component Value Date/Time   SDES  03/04/2022 1000    PLEURAL RT Performed at [facility], [place] [address] [place], [place] [address]    SDES  03/04/2022 1000    PLEURAL LT Performed at [facility], [place] [address] [place], [place] [address]    SPECREQUEST  03/04/2022 1000    NONE Performed at [facility], [place] [address], [place], [place] [address]    SPECREQUEST  03/04/2022 1000    NONE Performed at [facility], [place] [address], [place], [place] [address]    CULT  03/04/2022 1000    NO GROWTH 3 DAYS Performed at [facility] [address] [place], [place] [address]    CULT  03/04/2022 1000    NO GROWTH 3 DAYS Performed at [facility], [place] [address], [place], [place] [address]    REPTSTATUS 03/07/2022 FINAL 03/04/2022 1000   REPTSTATUS 03/07/2022 FINAL 03/04/2022 1000    Other culture-see note  Radiology Studies: DG CHEST PORT 1 VIEW  Result Date: 03/14/2022 CLINICAL DATA:  Pleural effusion EXAM: PORTABLE CHEST 1 VIEW COMPARISON:  Chest x-ray dated June twenty-eighth 2023 FINDINGS: Unchanged position of left arm PICC. Visualized cardiac and mediastinal contours are unchanged. Moderate loculated right pleural effusion, possibly slightly decreased when compared with prior radiograph. Unchanged moderate left pleural effusion. Bilateral chest tubes in place. Bibasilar opacities which are likely due to atelectasis. No evidence of pneumothorax. IMPRESSION: Moderate loculated right pleural effusion, possibly slightly decreased in size when compared with prior chest radiograph. Unchanged moderate left pleural effusion. Bilateral chest tubes in place. Electronically Signed   By: Yetta Glassman M.D.   On: 03/14/2022 10:40   CT CHEST ABDOMEN PELVIS WO CONTRAST  Result Date: 03/13/2022 CLINICAL DATA:  A 61 year old female presents for evaluation of mediastinal mass and abdominal pain. * Tracking Code: BO * EXAM: CT CHEST, ABDOMEN AND PELVIS WITHOUT CONTRAST TECHNIQUE: Multidetector CT imaging of the chest, abdomen and pelvis was performed following the standard protocol without IV contrast. RADIATION DOSE REDUCTION: This exam was performed according to the departmental dose-optimization program which includes automated exposure control, adjustment of the mA and/or kV according to patient size and/or use of iterative reconstruction technique. COMPARISON:  February 22, 2022. FINDINGS: CT CHEST FINDINGS Cardiovascular: LEFT-sided PICC terminates at the caval to atrial junction. Calcified atheromatous plaque in the thoracic aorta without aneurysmal dilation. Heart size normal. Suggestion  of pericardial nodularity anteriorly. No discrete measurable disease. Mediastinum/Nodes: Bulky adenopathy at the level of the thoracic inlet is unchanged compared to the June 10th exam grossly. Bulky nodularity in the RIGHT axilla with extensive surrounding edema largest lymph node 3.4 cm short axis (image 28/2) unchanged compared to recent imaging. Bulky lymph nodes fill the axilla and track into the subpectoral region with numerous lymph nodes also in the neck. RIGHT axillary adenopathy with surgical clips in the RIGHT axilla. Subpectoral adenopathy and axillary adenopathy not as pronounced as on the LEFT. Largest lymph node 21 mm short axis, previously approximately 24 mm. Grossly overall there is some improvement with respect to the bulk of lymph nodes particularly at the thoracic inlet when compared to previous imaging from June 1st. On June 1st the LEFT axillary lymph node measured above measured 4.2 cm. No mediastinal adenopathy.  No hilar adenopathy. Lungs/Pleura: Bilateral chest tubes in place without  pneumothorax. Still with large LEFT and RIGHT pleural effusions with loculated appearance. RIGHT-sided tube and LEFT-sided tube in the sub pulmonic aspect of the pleural fluid and RIGHT-sided pleural fluid appears more loculated than LEFT-sided fluid. Volume of pleural fluid in the LEFT and RIGHT chest is diminished on the RIGHT and only slightly diminished on the LEFT still with considerable volume of pleural fluid in the chest. Musculoskeletal: See below for full musculoskeletal details. CT ABDOMEN PELVIS FINDINGS Hepatobiliary: Smooth hepatic contours post cholecystectomy. 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 Spleen: Spleen is normal size. Adrenals/Urinary Tract: Adrenal glands are largely obscured without gross abnormality. Kidneys with smooth contours and no hydronephrosis. Smooth contour the urinary bladder. Stomach/Bowel: No signs of bowel dilation. Abundant stool in the transverse colon. No focal inflammation associated with bowel with limited assessment. Vascular/Lymphatic: Bulky adenopathy in the retroperitoneum has diminished, along the LEFT periaortic chain 2.9 cm short axis nodal tissue previously up to 4.7 cm. Decreased bulk of lymph nodes in the celiac region in general. Low LEFT periaortic lymph nodes (image 79/2) 2.6 cm as compared to 3.3 cm. Bulky pelvic lymph nodes largest on the LEFT is LEFT external iliac lymph node 2.7 cm as compared to 3.7 cm short axis. RIGHT external iliac lymph node (image 104/2) 2.9 cm as compared to 3.4 cm short axis. Groin lymph nodes are similarly decreased in size. Aortic atherosclerosis. Vascular structures are encased by nodal enlargement/soft tissue. Reproductive: Unremarkable grossly but difficult to assess. Other: Extensive body wall edema.  No pneumoperitoneum. Musculoskeletal: No acute bone finding. No destructive bone process. Spinal degenerative changes. IMPRESSION: 1. Bilateral chest tubes in place  without pneumothorax. 2. Still with large volume pleural fluid collections, more loculated appearing on the RIGHT despite chest tube placement. 3. Improving appearance of adenopathy over a series of prior exams most notably since examinations from early June still with extensive bulky adenopathy remaining highly concerning for lymphoproliferative disorder. 4. Anasarca. 5. Aortic atherosclerosis. Aortic Atherosclerosis (ICD10-I70.0). Electronically Signed   By: Zetta Bills M.D.   On: 03/13/2022 16:46     LOS: 28 days   Darliss Cheney, MD [facility]  03/14/2022, 11:35 AM

## 2022-03-14 NOTE — Progress Notes (Signed)
Physical Therapy Treatment Patient Details Name: [name] MRN: [id number] DOB: 07/16/1961 Today's Date: 03/14/2022   History of Present Illness Pt is a 61 y/o female presenting on 6/2 after calling EMS of abdominal pain and becoming unresponsive with AMS. Found hypotensive and hypoxic, intubated 6/2-6/4. CLL/SLL with bulky mediastinal adenopathy.  CXR with large L and small R pleural effusion. 6/3 R chest tube placed, removed 6/10. 6/12 R chest tube placement.  Transferred from [facility] to [name] on 6/13 for initation of palliative radiotherapy. 6/15 fell with imaging negative, mod-large effusion on L. 6/18 chest tube disloged. 6/19 bilateral chest tubes placed due to worsening respiratory distress. PMH includes: CLL, depression with anxiety, HTN, iron deficiency, western blot positive HSV2.    PT Comments    AxOx 3 very pleasant Lady.  In bed on 2 lts nasal @ 92%.  Eager to get OOB, "my butt is sore".  Assisted OOB required increased time and care due to multiple lines (2 chest tubes, 1 to suction, IV, O2 tubing, purewick)  Pt self able to transition to EOB with increased time, use of rail and assist with lines. General transfer comment: 50% VC's on proper hand placement as well as turn completion and assist with multiple lines.  First assisted to [facility] then to recliner.  Oxygen increased from 2 lts to 3 lts as sats decreased to 83% during actiivty. Positioned in recliner to comfort.  Noticed MAX amount of milky yellowish drainage from LEFT chest Tube as well as full canister.  Notified RN.   Patient is now scheduled for thoracic duct embolization in interventional radiology coming Monday at [facility].   Recommendations for follow up therapy are one component of a multi-disciplinary discharge planning process, led by the attending physician.  Recommendations may be updated based on patient status, additional functional criteria and insurance authorization.  Follow Up Recommendations  Skilled nursing-short  term rehab (<3 hours/day)     Assistance Recommended at Discharge Frequent or constant Supervision/Assistance  Patient can return home with the following A little help with walking and/or transfers;Assistance with cooking/housework;Assist for transportation;A little help with bathing/dressing/bathroom;Help with stairs or ramp for entrance   Equipment Recommendations  None recommended by PT    Recommendations for Other Services       Precautions / Restrictions Precautions Precautions: Fall Precaution Comments: bilateral chest tubes Restrictions Weight Bearing Restrictions: No     Mobility  Bed Mobility Overal bed mobility: Needs Assistance Bed Mobility: Supine to Sit     Supine to sit: Supervision     General bed mobility comments: Supv/cues for lines, safety.    Transfers Overall transfer level: Needs assistance Equipment used: Rolling walker (2 wheels) Transfers: Sit to/from Stand, Bed to chair/wheelchair/BSC Sit to Stand: Supervision   Step pivot transfers: Supervision, Min guard       General transfer comment: 50% VC's on proper hand placement as well as turn completion and assist with multiple lines.  First assisted to [facility] then to recliner.  Oxygen increased from 2 lts to 3 lts as sats decreased to 83% during actiivty.    Ambulation/Gait               General Gait Details: transfers only per RN report   Stairs             Wheelchair Mobility    Modified Rankin (Stroke Patients Only)       Balance  Cognition Arousal/Alertness: Awake/alert Behavior During Therapy: WFL for tasks assessed/performed Overall Cognitive Status: Within Functional Limits for tasks assessed                                 General Comments: AxO x 3 very pleasant Lady        Exercises      General Comments        Pertinent Vitals/Pain Pain Assessment Pain Assessment:  Faces Faces Pain Scale: Hurts little more Pain Location: L flank Pain Descriptors / Indicators: Discomfort, Grimacing Pain Intervention(s): Monitored during session    Home Living                          Prior Function            PT Goals (current goals can now be found in the care plan section)      Frequency    Min 2X/week      PT Plan Current plan remains appropriate    Co-evaluation              AM-PAC PT "6 Clicks" Mobility   Outcome Measure  Help needed turning from your back to your side while in a flat bed without using bedrails?: A Little Help needed moving from lying on your back to sitting on the side of a flat bed without using bedrails?: A Little Help needed moving to and from a bed to a chair (including a wheelchair)?: A Little Help needed standing up from a chair using your arms (e.g., wheelchair or bedside chair)?: A Little Help needed to walk in hospital room?: A Lot Help needed climbing 3-5 steps with a railing? : Total 6 Click Score: 15    End of Session Equipment Utilized During Treatment: Gait belt Activity Tolerance: Patient tolerated treatment well Patient left: in chair;with call bell/phone within reach Nurse Communication: Mobility status;Other (comment) (MAX amount of yellowish fluid draining from pt's LEFT chest tube during session.  Canister full.) PT Visit Diagnosis: Difficulty in walking, not elsewhere classified (R26.2);Unsteadiness on feet (R26.81)     Time: 1505-6979 PT Time Calculation (min) (ACUTE ONLY): 27 min  Charges:  $Therapeutic Activity: 23-37 mins                     {Braven Wolk  PTA Acute  Rehabilitation Services Office M-F          [phone] Weekend pager [phone]

## 2022-03-14 NOTE — Procedures (Addendum)
Pleural Fibrinolytic Administration Procedure Note  [name]  [id number]  [birth year]  Date:03/14/22  Time:8:43AM  Provider Performing:Valaree Fresquez Jerilynn Mages Ayesha Rumpf   Procedure: Pleural Fibrinolysis Initial day [phone])  Indication(s) Fibrinolysis of complicated pleural effusion (chylothorax)  Consent Risks of the procedure as well as the alternatives and risks of each were explained to the patient and/or caregiver.  Consent for the procedure was obtained.   Anesthesia None   Time Out Verified patient identification, verified procedure, site/side was marked, verified correct patient position, special equipment/implants available, medications/allergies/relevant history reviewed, required imaging and test results available.   Sterile Technique Hand hygiene, gloves   Procedure Description Existing LEFT pleural catheter was cleaned and accessed in sterile manner.  '10mg'$  of tPA in 30cc of saline was injected into pleural space using existing LEFT pleural catheter.  Catheter will be clamped for 1 hour and then placed back to suction.   Complications/Tolerance None; patient tolerated the procedure well.   EBL None   Specimen(s) None  [name], PA-C [facility] 03/14/22 9:59 AM  Please see [email or web address] for pager details.  From 7A-7P if no response, please call [phone] After hours, please call ELink [phone]

## 2022-03-14 NOTE — Progress Notes (Signed)
   03/14/22 0808  Assess: MEWS Score  Temp 98.3 F (36.8 C)  BP 120/76  MAP (mmHg) 90  Pulse Rate (!) 117  Resp (!) 40  Level of Consciousness Alert  SpO2 93 %  O2 Device Nasal Cannula  O2 Flow Rate (L/min) 2 L/min  Assess: MEWS Score  MEWS Temp 0  MEWS Systolic 0  MEWS Pulse 2  MEWS RR 3  MEWS LOC 0  MEWS Score 5  MEWS Score Color Red  Assess: if the MEWS score is Yellow or Red  Were vital signs taken at a resting state? Yes  Focused Assessment No change from prior assessment  Does the patient meet 2 or more of the SIRS criteria? Yes  Does the patient have a confirmed or suspected source of infection? Yes  MEWS guidelines implemented *See Row Information* No, previously red, continue vital signs every 4 hours  Treat  Pain Scale 0-10  Pain Score 7  Pain Type Acute pain  Pain Location Rib cage  Pain Orientation Right;Left;Anterior;Posterior;Lower  Pain Radiating Towards back, abdomen,  Pain Descriptors / Indicators Aching;Sharp  Pain Frequency Constant  Pain Onset Progressive  Patients Stated Pain Goal 5  Pain Intervention(s) Medication (See eMAR)  Multiple Pain Sites No  Complains of Shortness of breath  Interventions Medication (see MAR)  Assess: SIRS CRITERIA  SIRS Temperature  0  SIRS Pulse 1  SIRS Respirations  1  SIRS WBC 1  SIRS Score Sum  3

## 2022-03-14 NOTE — Progress Notes (Signed)
Hypoglycemic Event  CBG: 59  Treatment: 4 oz juice/soda  Symptoms:  Nauseous, lethargic  Follow-up CBG: Time:1630 CBG Result:82  Possible Reasons for Event: Inadequate meal intake and Medication regimen: Did not eat lunch  Comments/MD notified:MD notified    Angie Fava

## 2022-03-14 NOTE — Progress Notes (Addendum)
NAME:  [name], MRN:  [id number], DOB:  Jun 29, 1961, LOS: 51 ADMISSION DATE:  02/16/2022, CONSULTATION DATE:  6/2 REFERRING MD:  Dr. Alvino Chapel, CHIEF COMPLAINT:  Unresponsive; Acute resp failure   BRIEF  Patient is a 61 year old female with pertinent PMH of CLL, depression, anxiety, HLD, HTN presents to [facility] ED on 6/2 unresponsive.  On 6/2 patient called EMS for abdominal pain.  Patient initially was alert and talking but became unresponsive with AMS.  Patient was hypoxic with agonal breathing and was placed on NRB.  Patient was noted to be hypotensive.  Patient transported to [facility] ED.  Upon arrival to [facility] ED patient remained unresponsive with agonal respirations.  Patient intubated for airway protection and hypoxia.  BP initially 81/65.  Given IV fluids.  Started on levo.  Fever 100.9 F and WBC 104.4.  Started on cefepime, Flagyl, Vanco.  Cultures pending.  Glucose 198, BNP 534, troponin 109 then 112, LA 4.4 then 1.8, UA unremarkable, UDS negative.  CXR with large left pleural effusion and small right pleural effusion.  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  CT abdomen with ascending colitis constipation, small intestine wall thickening possibly related to gastroenteritis; small volume ascites; extensive abdominopelvic and inguinal adenopathy.   PCCM was consulted for bilateral pleural effusion  Pertinent  Medical History    has a past medical history of Anxiety, Chronic abdominal pain, Chronic lymphocytic leukemia (CLL), B-cell ([place]) (04/04/2016), CLL (chronic lymphocytic leukemia) ([place]), Depression with anxiety (07/07/2015), Family history of hyperlipidemia (12/14/2019), H. pylori infection (2/09), HAND PAIN, BILATERAL (01/29/2009), Hypertension, Iron deficiency (11/17/2016), Lymphadenopathy (01/17/2016), Medical non-compliance (08/22/2017), Nicotine addiction, Psychosis ([name]), and Western blot positive HSV2  (03/30/2012).   has a past surgical history that includes Surgery of left middle finger on left hand (childhood ); Tubal ligation; Cholecystectomy; Colonoscopy (02/07/2011); Hammer toe surgery (Left); Axillary lymph node biopsy (Right, 03/28/2016); Hysteroscopy with D & C (N/A, 07/30/2016); Thoracentesis (Left, 03/09/2022); and Chest tube insertion (N/A, 03/10/2022).  Significant Hospital Events: Including procedures, antibiotic start and stop dates in addition to other pertinent events   6/2 Presented [facility] ER unresponsive, intubated, shock on pressors > to [facility].  CT chest/abd w/ extensive LAN 6/3 Right chest tube placed. Left thora 2000 ml removed - milky 6/4 Extubated, off pressors.  Pleural fluid exudative, cx negative, lymphocytic. 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 Started on pulse dose steroids '50mg'$  pred.  6/7 Chest tube output over a liter 6/8 Left thoracentesis for 1400 cc of fluid 6/10 Chest tube removed 6/12 S/p right chest tube placement with 3.1 L drained 6/3 Tx from [facility] to [facility] 6/13 for initiation of palliative radiotherapy 6/14 No chest pain. Saturation 96% on 2 L. Chest tube drained 190 cc of milky fluid 6/15 Fell. CXR stable. Doing well. Got SIM for XRT.  RA.  Mod-large effusion on L. Refused CT. Octreotide. 6/16 TPN.  On [place] O2 . R CT 1000 cc out.  Started XRT 6/15, rituximab is being considered 6/17 CT issues with placement. Not sure if she wants new chest tube.  6/18 Dislodged right chest tube completely.  6/19 PleurX deferred due to poor candidate outpatient. Bilateral chest tubes placed due to worsening respiratory distress.  6/20 - new onset hypotension after bilateral chest tube and midodrine started 6/21 2.8L out from CT in last 24 hours. IR consulted  for thoracic duct embolization. Per IR: Lymphangiogram with thoracic duct embolization is not a routine procedure and will need to see when this procedure could potentially be  performed 6/24-continues with significant output from chest tubes 6/25 - Afebrile .decreased chest tube output 6/26 - walked with PT in my presene.Bilateral chest tubes with draining chylothorax. IR uncertain (4/62/70) being STARTED SIROLIMUS  6/27- Last 90 min > 50cc drain of chyle on left sidde. Rt side - hardly any ouput.  Growing consensus that thoracic duct ligation is indicated based on curbside with Dr Kipp Brood surgeon and also running case by surgeon in [place], Dr Lorenso Courier of oncology. Started on sirolimus yesterday, She is eating, ambulating and also on TPN. No fevers D/wDr McCullohgh of IR - he has experience with TDE and will plan for procedure at [facility] or [facility] next few to several day. Needs General and a 6h block of time Patient agreeable for  TDE RT chest tube not draining well - new minor fissure fluid collection 6/28 -overnight dyspneic . Now on 3L [place]. LEft chst tube is leaking. Rt chest tube not much drain. TPN ongoing. Good appetie. IR considering TDE 03/15/2022 - patient aware 6/29 TPN stopped, doing calorie count. CT CAP with large bl effusions and tubes in ok position  Interim History / Subjective:   Ongoing calorie count. More tachypneic over last couple of days.   Objective   Blood pressure 120/76, pulse (!) 117, temperature 98.3 F (36.8 C), temperature source Oral, resp. rate (!) 40, height '5\' 5"'$  (1.651 m), weight 51.5 kg, last menstrual period 07/25/2016, SpO2 93 %.        Intake/Output Summary (Last 24 hours) at 03/14/2022 0900 Last data filed at 03/13/2022 2325 Gross per 24 hour  Intake 1440 ml  Output 710 ml  Net 730 ml   Filed Weights   03/10/22 0348 03/11/22 0418 03/11/22 1144  Weight: 49.6 kg 51.7 kg 51.5 kg    I/O last 3 completed shifts: In: 2738.3 [P.O.:1440; I.V.:1298.3] Out: 1560 [Urine:1500; Chest Tube:60]  Intake/Output      06/29 0701 06/30 0700 06/30 0701 07/01 0700   P.O. 1440    I.V. (mL/kg)     IV Piggyback     Chest Tube     Total  Intake(mL/kg) 1440 (28)    Urine (mL/kg/hr) 750 (0.6)    Chest Tube 60    Total Output 810    Net +630          Physical exam: General appearance: 61 y.o., female, chronically ill appearing  Eyes: PERRL, tracking appropriately HENT: MMM Lungs: crackles bl, with normal respiratory effort CV: tachy RR, no murmur  Abdomen: Soft, non-tender; non-distended, BS present  Extremities: No peripheral edema, warm Skin: Normal turgor and texture; no rash Psych: Appropriate affect Neuro: Alert and oriented to person and place, no focal deficit   Chest tube on right tidaling with out air leak Chest tube on left tidaling with improved output after tPA  CT CAP 03/14/22 reviewed by me with chest tubes in adequate position, large burden of pleural effusions bl, more loculation on right, bulky adenopathy, anasarca   Assessment & Plan:   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 right axillary node was biopsies on 02/18/22 showing B cells consistent with her history of lymphoma and CLL - continue octreotide 02/25/22 -  - was on TPN since 02/25/22 - 03/13/22 - continue prednisone '50mg'$  per day  since 02/18/22 -  - on sirolimus since 03/10/22 - midodrine as below - tentatively scheduled for thoracic duct ligation 7/3 although I worry the likelihood of success may be limited as this seeems to be unlikely a clear case of thoracic duct disruption at 1 site and her chylothorax may be multifactorial with CLL and bulky adenopathy both playing roles. If we can get her pleural space evacuated on both sides and attempt at least tube pleurodesis on same day as TDL perhaps she'll have a more durable response? - trial tPA for left chest tube - there are case reports of tPA or streptokinase for loculated chylothorax. Discussed risks/benefits and alternative of replacing pigtail. She may need this for a few  days and on right side if tolerates well.  - o2 for goa pulse ox > 94%  Hypotension  - In setting of chronic disease, profound malnutrition but onset 6/20 after chest tube -continue midodrine   Severe protein calorie malnutrition  - off of TPN and doing calorie count    Best Practice:  Per [facility]

## 2022-03-14 NOTE — Progress Notes (Signed)
Nutrition Follow-up  DOCUMENTATION CODES:   Severe malnutrition in context of chronic illness, Underweight  INTERVENTION:   -48 hour Calorie Count  -Boost Breeze po TID, each supplement provides 250 kcal and 9 grams of protein   -Ensure MAX Protein po daily, each supplement provides 150 kcal and 30 grams of protein   -Multivitamin with minerals daily  NUTRITION DIAGNOSIS:   Severe Malnutrition related to chronic illness (CLL) as evidenced by severe muscle depletion, severe fat depletion.  Ongoing  GOAL:   Patient will meet greater than or equal to 90% of their needs  Progressing.  MONITOR:   PO intake, Supplement acceptance, Labs, Weight trends, I & O's  REASON FOR ASSESSMENT:   Consult Assessment of nutrition requirement/status Calorie Count  ASSESSMENT:   61 yo female admitted with AMS and unresponsiveness. PMH includes CLL, depression, anxiety, HLD, HTN, lymphadenopathy, iron deficiency.  Significant Events: 6/02 - admitted unresponsive, intubated 6/03 - s/p right chest tube placement, s/p L thoracentesis with 2 L removed 6/04 - extubated 6/08 - s/p thoracentesis with 1.4 L fluid removed 6/10 - chest tube removed 6/12 - s/p right chest tube placement with 3.1 L drained 6/13 - transferred from [facility] to [facility] for initiation of palliative XRT, TPN started 6/14 - diet advanced from NPO to Regular with 7 grams/meal restriction in place 6/15- XRT simulation + first session XRT 6/18- R-sided chest tube dislodged 6/19- bilateral chest tubes placed d/t worsening respiratory distress  6/29: TPN stopped  Patient sitting in chair. States her appetite was poor yesterday. Feels like she will eat better today. Pt feels like she cannot drink more than 2 Boosts daily.   Fat limit was removed from diet on 6/17. Added back as it still states in diet order 7g per meal restriction.  Plan is for IR guided thoracic duct ligation 7/3.  Calorie Count results 6/29: B: 165 kcals, 1g  protein L: didn't eat lunch D: 395 kcals, 13g protein Supplements: 2 Boost Breezes (500 kcals, 18g protein)  Total: 1060 kcals (46% of needs), 32g protein (22% of needs)  Breakfast 6/30: 477 kcals, 17g protein  Concerned pt won't meet nutritional needs with diet alone.  Medications: Colace, Octreotide, Miralax,  Labs reviewed: CBGs: 81-185  Diet Order:   Diet Order             Diet NPO time specified Except for: Sips with Meds  Diet effective midnight           Diet regular Room service appropriate? Yes; Fluid consistency: Thin  Diet effective now                   EDUCATION NEEDS:   Education needs have been addressed  Skin:  Skin Assessment: Skin Integrity Issues: Skin Integrity Issues:: Stage II Stage I: sacrum Stage II: sacrum and R back  Last BM:  6/25 (type 5 x1, large amount)  Height:   Ht Readings from Last 1 Encounters:  02/15/2022 '5\' 5"'$  (1.651 m)    Weight:   Wt Readings from Last 1 Encounters:  03/11/22 51.5 kg    BMI:  Body mass index is 18.89 kg/m.  Estimated Nutritional Needs:   Kcal:  7116-5790  Protein:  140-160 grams  Fluid:  >2.5 L  Cl[place]ton Bibles, MS, RD, LDN Inpatient Clinical Dietitian Contact information available via Amion

## 2022-03-14 NOTE — TOC Progression Note (Signed)
Transition of Care [facility]) - Progression Note    Patient Details  Name: [name] MRN: [id number] Date of Birth: [birth year]  [facility] CM/SW Contact  Leeroy Cha, RN Phone Number: 03/14/2022, 8:33 AM  Clinical Narrative:    [phone] chart reviewed.  Following for toc needs.   Expected Discharge Plan: Long Term Nursing Home Barriers to Discharge: Continued Medical Work up, Ship broker, Other (must enter comment) (LTC placement)  Expected Discharge Plan and Services Expected Discharge Plan: [place]     Post Acute Care Choice: Nursing Home Living arrangements for the past 2 months: Single Family Home                                       Social Determinants of Health (SDOH) Interventions    Readmission Risk Interventions     No data to display

## 2022-03-15 ENCOUNTER — Inpatient Hospital Stay (HOSPITAL_COMMUNITY): Payer: Medicaid Other

## 2022-03-15 DIAGNOSIS — R6521 Severe sepsis with septic shock: Secondary | ICD-10-CM

## 2022-03-15 DIAGNOSIS — G9341 Metabolic encephalopathy: Secondary | ICD-10-CM

## 2022-03-15 DIAGNOSIS — J9601 Acute respiratory failure with hypoxia: Secondary | ICD-10-CM | POA: Diagnosis not present

## 2022-03-15 DIAGNOSIS — A419 Sepsis, unspecified organism: Secondary | ICD-10-CM | POA: Diagnosis not present

## 2022-03-15 DIAGNOSIS — E876 Hypokalemia: Secondary | ICD-10-CM

## 2022-03-15 DIAGNOSIS — J940 Chylous effusion: Secondary | ICD-10-CM | POA: Diagnosis not present

## 2022-03-15 LAB — CBC WITH DIFFERENTIAL/PLATELET
Abs Immature Granulocytes: 0.06 10*3/uL (ref 0.00–0.07)
Basophils Absolute: 0 10*3/uL (ref 0.0–0.1)
Basophils Relative: 0 %
Eosinophils Absolute: 0 10*3/uL (ref 0.0–0.5)
Eosinophils Relative: 0 %
HCT: 36.1 % (ref 36.0–46.0)
Hemoglobin: 11.6 g/dL — ABNORMAL LOW (ref 12.0–15.0)
Immature Granulocytes: 0 %
Lymphocytes Relative: 84 %
Lymphs Abs: 22.2 10*3/uL — ABNORMAL HIGH (ref 0.7–4.0)
MCH: 30.1 pg (ref 26.0–34.0)
MCHC: 32.1 g/dL (ref 30.0–36.0)
MCV: 93.8 fL (ref 80.0–100.0)
Monocytes Absolute: 0.2 10*3/uL (ref 0.1–1.0)
Monocytes Relative: 1 %
Neutro Abs: 4.1 10*3/uL (ref 1.7–7.7)
Neutrophils Relative %: 15 %
Platelets: 176 10*3/uL (ref 150–400)
RBC: 3.85 MIL/uL — ABNORMAL LOW (ref 3.87–5.11)
RDW: 20.4 % — ABNORMAL HIGH (ref 11.5–15.5)
WBC: 26.6 10*3/uL — ABNORMAL HIGH (ref 4.0–10.5)
nRBC: 0 % (ref 0.0–0.2)

## 2022-03-15 LAB — BASIC METABOLIC PANEL
Anion gap: 5 (ref 5–15)
BUN: 26 mg/dL — ABNORMAL HIGH (ref 8–23)
CO2: 31 mmol/L (ref 22–32)
Calcium: 8 mg/dL — ABNORMAL LOW (ref 8.9–10.3)
Chloride: 105 mmol/L (ref 98–111)
Creatinine, Ser: 0.42 mg/dL — ABNORMAL LOW (ref 0.44–1.00)
GFR, Estimated: >60 mL/min (ref >60)
Glucose, Bld: 66 mg/dL — ABNORMAL LOW (ref 70–99)
Potassium: 5.3 mmol/L — ABNORMAL HIGH (ref 3.5–5.1)
Sodium: 141 mmol/L (ref 135–145)

## 2022-03-15 LAB — PHOSPHORUS: Phosphorus: 3.5 mg/dL (ref 2.5–4.6)

## 2022-03-15 LAB — MAGNESIUM: Magnesium: 2.4 mg/dL (ref 1.7–2.4)

## 2022-03-15 LAB — GLUCOSE, CAPILLARY
Glucose-Capillary: 140 mg/dL — ABNORMAL HIGH (ref 70–99)
Glucose-Capillary: 233 mg/dL — ABNORMAL HIGH (ref 70–99)
Glucose-Capillary: 256 mg/dL — ABNORMAL HIGH (ref 70–99)
Glucose-Capillary: 274 mg/dL — ABNORMAL HIGH (ref 70–99)
Glucose-Capillary: 96 mg/dL (ref 70–99)

## 2022-03-15 LAB — LACTATE DEHYDROGENASE: LDH: 199 U/L — ABNORMAL HIGH (ref 98–192)

## 2022-03-15 MED ORDER — TRAVASOL 10 % IV SOLN
INTRAVENOUS | Status: AC
  Filled 2022-03-15: qty 750

## 2022-03-15 MED ORDER — SODIUM CHLORIDE 0.9% FLUSH
10.0000 mL | Freq: Three times a day (TID) | INTRAVENOUS | Status: DC
  Administered 2022-03-16 – 2022-03-18 (×3): 10 mL

## 2022-03-15 MED ORDER — INSULIN ASPART 100 UNIT/ML IJ SOLN
0.0000 [IU] | Freq: Four times a day (QID) | INTRAMUSCULAR | Status: DC
  Administered 2022-03-15: 5 [IU] via SUBCUTANEOUS
  Administered 2022-03-15: 1 [IU] via SUBCUTANEOUS
  Administered 2022-03-15: 3 [IU] via SUBCUTANEOUS
  Administered 2022-03-16: 2 [IU] via SUBCUTANEOUS

## 2022-03-15 MED ORDER — SODIUM CHLORIDE (PF) 0.9 % IJ SOLN
10.0000 mg | Freq: Once | INTRAMUSCULAR | Status: AC
  Administered 2022-03-15: 10 mg via INTRAPLEURAL
  Filled 2022-03-15: qty 10

## 2022-03-15 NOTE — Assessment & Plan Note (Signed)
Appears close to euvolemic

## 2022-03-15 NOTE — Assessment & Plan Note (Addendum)
S/p TDE by Dr. Laurence Ferrari on 7/3 Chest tube output still very high, 1L out of each tube in the last 24 hours  - Continue prednisone and sirolimus - Continue octreotide - Continue fat restricted diet and TPN to reduce lipid absorption/chyle  - Trend CBC given chyle loss

## 2022-03-15 NOTE — Progress Notes (Signed)
  Progress Note   Patient: [name] ZOX:[id number] DOB: 1960-09-30 DOA: 02/13/2022     29 DOS: the patient was seen and examined on 03/15/2022 at 2:15PM      Brief hospital course: [name] is a 61 y.o. F with dCHF, HTN and CLL who presented to [facility] with respiratory distress, became unresponsive and agonal.   Long and complicated hospitalization since then due to difficult to manage chylothorax.  IR, Oncology, and Pulmonary involved.  Plan for thoracic duct embolization next week.       Assessment and Plan: * Septic shock ([place]) - Continue cefoxitin  Acute respiratory failure with hypoxia (HCC) Improved, stable on supplemental O2 by [place]  Bilateral chylothorax - Consult IR and Pulmonology - Continue octreotide  Hypokalemia    Acute metabolic encephalopathy Resolved   DM type 2 (diabetes mellitus, type 2) (HCC) Glucoses controlled - Continue SS correction insulin  Small cell B-cell lymphoma (HCC)    Hyperkalemia    Pressure injury of skin    Protein-calorie malnutrition, severe - Continue TPN  Chronic diastolic heart failure (HCC) Appears close to euvolemic  CLL (chronic lymphocytic leukemia) (HCC) - Continue sirolimus and prednisone  Essential hypertension BP normal off meds          Subjective: She has some nausea, generalized weakness, no fever, respiratory distress, confusion.     Physical Exam: Vitals:   03/15/22 0613 03/15/22 0622 03/15/22 1112 03/15/22 1255  BP:  97/69 115/80 106/76  Pulse:  (!) 106 94 (!) 105  Resp: (!) 29 (!) 24  (!) 24  Temp:  97.9 F (36.6 C)  98.6 F (37 C)  TempSrc:  Oral  Oral  SpO2:  99% 99% 93%  Weight:      Height:       Elderly adult female, lying in bed, no acute distress.   RRR, no murmurs, no LE edema Respiartory effort normal, anterior lungs clear, diminished at periphery, no rales, no wheeizng Abdomen soft nontender Attention normal, speech fluent, moves all extremities with no  weakness.  Data Reviewed: CBC showed improved WBC BMP normal         Disposition: Status is: Inpatient         Author: Edwin Dada, MD 03/15/2022 3:28 PM  For on call review [email or web address].

## 2022-03-15 NOTE — Progress Notes (Signed)
NAME:  [name], MRN:  [id number], DOB:  1960/09/18, LOS: 34 ADMISSION DATE:  03/12/2022, CONSULTATION DATE:  6/2 REFERRING MD:  Dr. Alvino Chapel, CHIEF COMPLAINT:  Unresponsive; Acute resp failure   BRIEF  Patient is a 61 year old female with pertinent PMH of CLL, depression, anxiety, HLD, HTN presents to [facility] ED on 6/2 unresponsive.  On 6/2 patient called EMS for abdominal pain.  Patient initially was alert and talking but became unresponsive with AMS.  Patient was hypoxic with agonal breathing and was placed on NRB.  Patient was noted to be hypotensive.  Patient transported to [facility] ED.  Upon arrival to [facility] ED patient remained unresponsive with agonal respirations.  Patient intubated for airway protection and hypoxia.  BP initially 81/65.  Given IV fluids.  Started on levo.  Fever 100.9 F and WBC 104.4.  Started on cefepime, Flagyl, Vanco.  Cultures pending.  Glucose 198, BNP 534, troponin 109 then 112, LA 4.4 then 1.8, UA unremarkable, UDS negative.  CXR with large left pleural effusion and small right pleural effusion.  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  CT abdomen with ascending colitis constipation, small intestine wall thickening possibly related to gastroenteritis; small volume ascites; extensive abdominopelvic and inguinal adenopathy.   PCCM was consulted for bilateral pleural effusion  Pertinent  Medical History    has a past medical history of Anxiety, Chronic abdominal pain, Chronic lymphocytic leukemia (CLL), B-cell ([place]) (04/04/2016), CLL (chronic lymphocytic leukemia) ([place]), Depression with anxiety (07/07/2015), Family history of hyperlipidemia (12/14/2019), H. 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   has a past surgical history that includes Surgery of left middle finger on left hand (childhood ); Tubal ligation; Cholecystectomy; Colonoscopy (02/07/2011); Hammer toe surgery (Left); Axillary lymph node biopsy (Right, 03/28/2016); Hysteroscopy with D & C (N/A, 07/30/2016); Thoracentesis (Left, 03/08/2022); and Chest tube insertion (N/A, 03/07/2022).  Significant Hospital Events: Including procedures, antibiotic start and stop dates in addition to other pertinent events   6/2 Presented [facility] ER unresponsive, intubated, shock on pressors > to [facility].  CT chest/abd w/ extensive LAN 6/3 Right chest tube placed. Left thora 2000 ml removed - milky 6/4 Extubated, off pressors.  Pleural fluid exudative, cx negative, lymphocytic. 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 Started on pulse dose steroids '50mg'$  pred.  6/7 Chest tube output over a liter 6/8 Left thoracentesis for 1400 cc of fluid 6/10 Chest tube removed 6/12 S/p right chest tube placement with 3.1 L drained 6/3 Tx from [facility] to [facility] 6/13 for initiation of palliative radiotherapy 6/14 No chest pain. Saturation 96% on 2 L. Chest tube drained 190 cc of milky fluid 6/15 Fell. CXR stable. Doing well. Got SIM for XRT.  RA.  Mod-large effusion on L. Refused CT. Octreotide. 6/16 TPN.  On [place] O2 . R CT 1000 cc out.  Started XRT 6/15, rituximab is being considered 6/17 CT issues with placement. Not sure if she wants new chest tube.  6/18 Dislodged right chest tube completely.  6/19 PleurX deferred due to poor candidate outpatient. Bilateral chest tubes placed due to worsening respiratory distress.  6/20 - new onset hypotension after bilateral chest tube and midodrine started 6/21 2.8L out from CT in last 24 hours. IR consulted  for thoracic duct embolization. Per IR: Lymphangiogram with thoracic duct embolization is not a routine procedure and will need to see when this procedure could potentially be  performed 6/24-continues with significant output from chest tubes 6/25 - Afebrile .decreased chest tube output 6/26 - walked with PT in my presene.Bilateral chest tubes with draining chylothorax. IR uncertain (12/05/53) being STARTED SIROLIMUS  6/27- Last 90 min > 50cc drain of chyle on left sidde. Rt side - hardly any ouput.  Growing consensus that thoracic duct ligation is indicated based on curbside with Dr Kipp Brood surgeon and also running case by surgeon in [place], Dr Lorenso Courier of oncology. Started on sirolimus yesterday, She is eating, ambulating and also on TPN. No fevers D/wDr McCullohgh of IR - he has experience with TDE and will plan for procedure at [facility] or [facility] next few to several day. Needs General and a 6h block of time Patient agreeable for  TDE RT chest tube not draining well - new minor fissure fluid collection 6/28 -overnight dyspneic . Now on 3L [place]. LEft chst tube is leaking. Rt chest tube not much drain. TPN ongoing. Good appetie. IR considering TDE 03/28/2022 - patient aware 6/29 TPN stopped, doing calorie count. CT CAP with large bl effusions and tubes in ok position 6/30 tPA in left chest tube with good output 7/1 restarted TPN - not meeting caloric needs and wish to give her every shot possible at pleurodesis following TDE on 7/3. tPA in right chest tube  Interim History / Subjective:   Feeling less dyspneic today. Still not eating much and not near caloric intake goal on calorie count.  Objective   Blood pressure 115/80, pulse 94, temperature 97.9 F (36.6 C), temperature source Oral, resp. rate (!) 24, height '5\' 5"'$  (1.651 m), weight 50.3 kg, last menstrual period 07/25/2016, SpO2 99 %.        Intake/Output Summary (Last 24 hours) at 03/15/2022 1248 Last data filed at 03/15/2022 1201 Gross per 24 hour  Intake 1139.01 ml  Output 1370 ml  Net -230.99 ml   Filed Weights   03/11/22 0418 03/11/22 1144 03/15/22 0401  Weight: 51.7 kg 51.5 kg 50.3 kg    I/O last 3 completed  shifts: In: 999 [P.O.:480; I.V.:439; IV Piggyback:50] Out: 7322 [Urine:1200; Chest Tube:2530]  Intake/Output      06/30 0701 07/01 0700 07/01 0701 07/02 0700   P.O. 240 480   I.V. (mL/kg) 439 (8.7) 10 (0.2)   IV Piggyback 50    Chest Tube 30 20   Total Intake(mL/kg) 759 (15.1) 510 (10.1)   Urine (mL/kg/hr) 700 (0.6)    Stool 0    Chest Tube 2530 20   Total Output 3230 20   Net -2471 +490        Urine Occurrence 2 x    Stool Occurrence 2 x     Physical exam: General appearance: 61 y.o., female, chronically ill appearing  Eyes: PERRL, tracking appropriately HENT: MMM Lungs: diminished bl, with normal respiratory effort CV: tachy RR, no murmur  Abdomen: Soft, non-tender; non-distended, BS present  Extremities: No peripheral edema, warm Skin: Normal turgor and texture; no rash Psych: Appropriate affect Neuro: Alert and oriented to person and place, no focal deficit   Chest tube on right tidaling without air leak Chest tube on left tidaling without air leak  CXR with improved aeration on left  Assessment & Plan:   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 right axillary node was biopsies on 02/18/22 showing B cells consistent with her history of lymphoma and CLL - continue octreotide 02/25/22 -  - restart TPN since 02/25/22 - 03/13/22, 7/1- - continue prednisone '50mg'$  per day since 02/18/22 -  - on sirolimus since 03/10/22 - midodrine as below - tentatively scheduled for thoracic duct embolization 7/3. Hopefully as output diminishes we'll be in better position to try talc tube pleurodesis a few days after TDE - trial tPA for right chest tube - there are case reports of tPA or streptokinase for loculated chylothorax. Discussed risks/benefits and alternative of replacing pigtail. She may need this for a few days and on right side if tolerates well.  - o2 for goa  pulse ox > 94%  Hypotension  - In setting of chronic disease, profound malnutrition but onset 6/20 after chest tube -continue midodrine   Severe protein calorie malnutrition  - back on TPN    Best Practice:  Per [place]

## 2022-03-15 NOTE — Progress Notes (Signed)
Upon repositioning patient in bed, RN noticed significant output out of right pleural catheter. Pt began to cough uncontrollably with increased work of breathing.   Rapid response called. Obtained instructions to place right pleural catheter to water seal and remove from suction from [place] via rapid Therapist, sports.   Zofran given for nausea. IV morphine given for pain and SOB.   Patient drained 1463m of milky red fluid in a matter of 144m out of right chest catheter.   Chest x-ray obtained per order.  Pt red MEWS Temp 99.9, BP 102/76, HR 103, RR 41, O2 94% on 3L . Charge RN, Rapid and  MD made aware.  Will continue to closely monitor patient.

## 2022-03-15 NOTE — Assessment & Plan Note (Addendum)
Due to nuritional loss from chyle leak. - Continue TPN

## 2022-03-15 NOTE — Assessment & Plan Note (Signed)
BP soft - Continue midodrine

## 2022-03-15 NOTE — Progress Notes (Signed)
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  Assessment: 61 year old female with pertinent PMH of CLL, depression, anxiety, HLD, HTN presents to [facility] ED on 6/2 unresponsive.  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 Patient also has bilateral chylothorax with high output. Pharmacy has been consulted for TPN.  Glucose / Insulin: new onset DM2 in addition to steroids.  A1c 6.5 - CBGs (goal <180) - range 59-261 - Sensitive SSI TID + QHS:  used 8 / 24 hrs - Semglee 8 units daily - Remains on prednisone 50 mg daily, octreotide 50 mcg TID Electrolytes: K elevated at 5.3, others WNL but mag at upper end of range Renal: Scr < 1, BUN remains elevated Hepatic: LFTs WNL, Tbili low, Albumin 2.2, TG 59 (6/29) Intake / Output:  - UOP: 700 mL; Chest tube output: 2530 mL - Last reported BM 6/28. MIVF:  KVO GI Surgeries / Procedures: None this admission  Central access: PICC placed 6/15 TPN start date: 6/13  Nutritional Goals: Goal TPN rate is 100 mL/hr (provides 150 g of protein and 2308 kcals per day)  RD Assessment:  (6/30) Estimated Needs Total Energy Estimated Needs: 2300-2600 Total Protein Estimated Needs: 140-160 grams Total Fluid Estimated Needs: >2.5 L  Current Nutrition:  - Regular Low fat diet, no more than 7 g fat per meal -Boost Breeze po TID, each supplement provides 250 kcal and 9 grams of protein  -Ensure MAX Protein po daily, each supplement provides 150 kcal and 30 grams of protein  - TPN d/c on 6/29, resume on 7/1  Plan:  At 1800: Resume TPN at 50 mL/hr (1/2 rate) Electrolytes in TPN: Na 40 mEq/L K  0  mEq/L Ca 5 mEq/L Mg 0 mEq/L Phos 5 mmol/L Cl:Ac 1:1 Continue Semglee 8 units daily  Continue oral multivitamin Sensitive  SSI q4h and adjust as needed. Monitor TPN labs on Mon/Thurs, and tomorrow.    Gretta Arab PharmD, BCPS Clinical Pharmacist [facility] main pharmacy [phone] 03/15/2022 10:07 AM

## 2022-03-15 NOTE — Assessment & Plan Note (Signed)
Resolved

## 2022-03-15 NOTE — Assessment & Plan Note (Signed)
Improved, stable on supplemental O2 by [place]

## 2022-03-15 NOTE — Assessment & Plan Note (Addendum)
-   Continue sirolimus and prednisone

## 2022-03-15 NOTE — Progress Notes (Signed)
Right pleural catheter unclamped and connected to 20cm of suction an hour after MD Meier's administration of tPA. Left pleural catheter connected back to 20cm of suction per MD instruction.   Patient is resting comfortably in bed. Vitals stable.

## 2022-03-15 NOTE — Assessment & Plan Note (Signed)
Glucoses good - Continue SS correction insulin - Continue glargine

## 2022-03-15 NOTE — Procedures (Signed)
Pleural Fibrinolytic Administration Procedure Note  [name]  [id number]  05-26-1961  Date:03/15/22  Time:12:53 PM   Provider Performing:Tashunda Vandezande Jerilynn Mages Verlee Monte   Procedure: Pleural Fibrinolysis Initial day [phone])  Indication(s) Fibrinolysis of complicated right pleural effusion  Consent Risks of the procedure as well as the alternatives and risks of each were explained to the patient and/or caregiver.  Consent for the procedure was obtained.   Anesthesia None   Time Out Verified patient identification, verified procedure, site/side was marked, verified correct patient position, special equipment/implants available, medications/allergies/relevant history reviewed, required imaging and test results available.   Sterile Technique Hand hygiene, gloves   Procedure Description Existing pleural catheter was cleaned and accessed in sterile manner.  '10mg'$  of tPA in 30cc of saline was injected into pleural space using existing pleural catheter.  Catheter will be clamped for 1 hour and then placed back to suction.   Complications/Tolerance None; patient tolerated the procedure well.  EBL None   Specimen(s) None

## 2022-03-15 NOTE — Assessment & Plan Note (Addendum)
Resolved

## 2022-03-15 DEATH — deceased

## 2022-03-16 ENCOUNTER — Inpatient Hospital Stay (HOSPITAL_COMMUNITY): Payer: Medicaid Other

## 2022-03-16 DIAGNOSIS — I959 Hypotension, unspecified: Secondary | ICD-10-CM

## 2022-03-16 DIAGNOSIS — J940 Chylous effusion: Secondary | ICD-10-CM | POA: Diagnosis not present

## 2022-03-16 DIAGNOSIS — E871 Hypo-osmolality and hyponatremia: Secondary | ICD-10-CM

## 2022-03-16 DIAGNOSIS — J9601 Acute respiratory failure with hypoxia: Secondary | ICD-10-CM | POA: Diagnosis not present

## 2022-03-16 DIAGNOSIS — G9341 Metabolic encephalopathy: Secondary | ICD-10-CM | POA: Diagnosis not present

## 2022-03-16 DIAGNOSIS — A419 Sepsis, unspecified organism: Secondary | ICD-10-CM | POA: Diagnosis not present

## 2022-03-16 LAB — COMPREHENSIVE METABOLIC PANEL
ALT: 91 U/L — ABNORMAL HIGH (ref 0–44)
AST: 56 U/L — ABNORMAL HIGH (ref 15–41)
Albumin: 1.7 g/dL — ABNORMAL LOW (ref 3.5–5.0)
Alkaline Phosphatase: 175 U/L — ABNORMAL HIGH (ref 38–126)
Anion gap: 5 (ref 5–15)
BUN: 26 mg/dL — ABNORMAL HIGH (ref 8–23)
CO2: 28 mmol/L (ref 22–32)
Calcium: 7.4 mg/dL — ABNORMAL LOW (ref 8.9–10.3)
Chloride: 99 mmol/L (ref 98–111)
Creatinine, Ser: 0.37 mg/dL — ABNORMAL LOW (ref 0.44–1.00)
GFR, Estimated: >60 mL/min (ref >60)
Glucose, Bld: 114 mg/dL — ABNORMAL HIGH (ref 70–99)
Potassium: 5.1 mmol/L (ref 3.5–5.1)
Sodium: 132 mmol/L — ABNORMAL LOW (ref 135–145)
Total Bilirubin: 0.4 mg/dL (ref 0.3–1.2)
Total Protein: 4.4 g/dL — ABNORMAL LOW (ref 6.5–8.1)

## 2022-03-16 LAB — CBC WITH DIFFERENTIAL/PLATELET
Abs Immature Granulocytes: 0.05 10*3/uL (ref 0.00–0.07)
Basophils Absolute: 0.1 10*3/uL (ref 0.0–0.1)
Basophils Relative: 0 %
Eosinophils Absolute: 0 10*3/uL (ref 0.0–0.5)
Eosinophils Relative: 0 %
HCT: 35.3 % — ABNORMAL LOW (ref 36.0–46.0)
Hemoglobin: 11.3 g/dL — ABNORMAL LOW (ref 12.0–15.0)
Immature Granulocytes: 0 %
Lymphocytes Relative: 84 %
Lymphs Abs: 23.7 10*3/uL — ABNORMAL HIGH (ref 0.7–4.0)
MCH: 30 pg (ref 26.0–34.0)
MCHC: 32 g/dL (ref 30.0–36.0)
MCV: 93.6 fL (ref 80.0–100.0)
Monocytes Absolute: 0.3 10*3/uL (ref 0.1–1.0)
Monocytes Relative: 1 %
Neutro Abs: 4.2 10*3/uL (ref 1.7–7.7)
Neutrophils Relative %: 15 %
Platelets: 175 10*3/uL (ref 150–400)
RBC: 3.77 MIL/uL — ABNORMAL LOW (ref 3.87–5.11)
RDW: 19.9 % — ABNORMAL HIGH (ref 11.5–15.5)
WBC: 28.3 10*3/uL — ABNORMAL HIGH (ref 4.0–10.5)
nRBC: 0.1 % (ref 0.0–0.2)

## 2022-03-16 LAB — GLUCOSE, CAPILLARY
Glucose-Capillary: 159 mg/dL — ABNORMAL HIGH (ref 70–99)
Glucose-Capillary: 244 mg/dL — ABNORMAL HIGH (ref 70–99)
Glucose-Capillary: 266 mg/dL — ABNORMAL HIGH (ref 70–99)
Glucose-Capillary: 212 mg/dL — ABNORMAL HIGH (ref 70–99)
Glucose-Capillary: 214 mg/dL — ABNORMAL HIGH (ref 70–99)

## 2022-03-16 LAB — LACTATE DEHYDROGENASE: LDH: 172 U/L (ref 98–192)

## 2022-03-16 LAB — PHOSPHORUS: Phosphorus: 2.3 mg/dL — ABNORMAL LOW (ref 2.5–4.6)

## 2022-03-16 LAB — TRIGLYCERIDES: Triglycerides: 83 mg/dL (ref <150)

## 2022-03-16 LAB — MAGNESIUM: Magnesium: 2.1 mg/dL (ref 1.7–2.4)

## 2022-03-16 MED ORDER — TRAVASOL 10 % IV SOLN
INTRAVENOUS | Status: AC
  Filled 2022-03-16: qty 1500

## 2022-03-16 MED ORDER — SIMETHICONE 80 MG PO CHEW
80.0000 mg | CHEWABLE_TABLET | Freq: Four times a day (QID) | ORAL | Status: DC | PRN
  Administered 2022-03-16 – 2022-03-25 (×7): 80 mg via ORAL
  Filled 2022-03-16 (×7): qty 1

## 2022-03-16 MED ORDER — BISACODYL 10 MG RE SUPP
10.0000 mg | Freq: Every day | RECTAL | Status: DC | PRN
  Administered 2022-03-16 – 2022-03-23 (×2): 10 mg via RECTAL
  Filled 2022-03-16 (×2): qty 1

## 2022-03-16 MED ORDER — INSULIN ASPART 100 UNIT/ML IJ SOLN
0.0000 [IU] | INTRAMUSCULAR | Status: DC
  Administered 2022-03-16: 5 [IU] via SUBCUTANEOUS
  Administered 2022-03-16: 8 [IU] via SUBCUTANEOUS
  Administered 2022-03-16: 5 [IU] via SUBCUTANEOUS
  Administered 2022-03-17 (×2): 3 [IU] via SUBCUTANEOUS

## 2022-03-16 MED ORDER — LIP MEDEX EX OINT: TOPICAL_OINTMENT | CUTANEOUS | Status: DC | PRN

## 2022-03-16 MED ORDER — PHENOL 1.4 % MT LIQD
1.0000 | OROMUCOSAL | Status: DC | PRN
Start: 2022-03-16 — End: 2022-03-26
  Administered 2022-03-16: 1 via OROMUCOSAL
  Filled 2022-03-16: qty 177

## 2022-03-16 MED ORDER — SODIUM PHOSPHATES 45 MMOLE/15ML IV SOLN
15.0000 mmol | Freq: Once | INTRAVENOUS | Status: AC
  Administered 2022-03-16: 15 mmol via INTRAVENOUS
  Filled 2022-03-16: qty 5

## 2022-03-16 NOTE — Assessment & Plan Note (Signed)
Continue midodrine  

## 2022-03-16 NOTE — Progress Notes (Signed)
PHARMACY - TOTAL PARENTERAL NUTRITION CONSULT NOTE   Indication:  bilateral chylothorax with high chest tube output  Patient Measurements: Height: '5\' 5"'$  (165.1 cm) Weight: 48.7 kg (107 lb 5.8 oz) IBW/kg (Calculated) : 57 TPN AdjBW (KG): 64 Body mass index is 17.87 kg/m.  Assessment: 61 year old female with pertinent PMH of CLL, depression, anxiety, HLD, HTN presents to [facility] ED on 6/2 unresponsive.  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 Patient also has bilateral chylothorax with high output. Pharmacy has been consulted for TPN.  Glucose / Insulin: new onset DM2 in addition to steroids.  A1c 6.5 - CBGs (goal <180) - increased, but elevated prior to TPN start.  Range 159-233 after TPN started.   - Sensitive SSI TID + QHS:  used 11 / 24 hrs - Semglee 8 units daily - Remains on prednisone 50 mg daily, octreotide 50 mcg TID Electrolytes: Na low, K decreased to 5.1, Phos low.   Renal: Scr < 1, BUN remains elevated Hepatic: LFTs WNL, Tbili low, Albumin 2.2, TG 59 (6/29) Intake / Output:  - UOP: 850 mL; Chest tube output: 2872 mL - Last reported BM 6/30 MIVF:  KVO GI Surgeries / Procedures: None this admission  Central access: PICC placed 6/15 TPN start date: 6/13-6/29, resumed 7/1   Nutritional Goals: Goal TPN rate is 100 mL/hr (provides 150 g of protein and 2308 kcals per day)  RD Assessment:  (6/30) Estimated Needs Total Energy Estimated Needs: 2300-2600 Total Protein Estimated Needs: 140-160 grams Total Fluid Estimated Needs: >2.5 L  Current Nutrition:  - Regular Low fat diet, no more than 7 g fat per meal -Boost Breeze po TID (each: 250 kcal and 9 grams of protein) -Ensure MAX Protein daily (each: 150 kcal and 30 grams of protein) - TPN  Plan:  NaPhos 15 mmol IVPB x1  At 1800: Increase to TPN at 100 mL/hr (goal rate) Electrolytes in TPN: Na 75 mEq/L K  0  mEq/L Ca 5 mEq/L Mg 3 mEq/L Phos 10 mmol/L Cl:Ac 1:1 Continue oral multivitamin Continue  Semglee 8 units daily  Increase to moderate SSI q4h and adjust as needed. Monitor TPN labs on Mon/Thurs Continue to follow up ability to meet nutrition goals with oral intake.     Gretta Arab PharmD, BCPS Clinical Pharmacist [facility] main pharmacy [phone] 03/16/2022 9:12 AM

## 2022-03-16 NOTE — Progress Notes (Signed)
Nutrition Follow-up  DOCUMENTATION CODES:   Severe malnutrition in context of chronic illness, Underweight  INTERVENTION:   -TPN management per Pharmacy  -Boost Breeze po TID, each supplement provides 250 kcal and 9 grams of protein  -Ensure MAX Protein po daily, each supplement provides 150 kcal and 30 grams of protein   -Multivitamin with minerals daily  -D/c Calorie Count now pt is on TPN   NUTRITION DIAGNOSIS:   Severe Malnutrition related to chronic illness (CLL) as evidenced by severe muscle depletion, severe fat depletion.  Ongoing.  GOAL:   Patient will meet greater than or equal to 90% of their needs  Meeting with TPN  MONITOR:   PO intake, Supplement acceptance, Labs, Weight trends, I & O's, Other (Comment) (TPN regimen)  REASON FOR ASSESSMENT:   Consult New TPN/TNA  ASSESSMENT:   61 yo female admitted with AMS and unresponsiveness. PMH includes CLL, depression, anxiety, HLD, HTN, lymphadenopathy, iron deficiency.  Significant Events: 6/02 - admitted unresponsive, intubated 6/03 - s/p right chest tube placement, s/p L thoracentesis with 2 L removed 6/04 - extubated 6/08 - s/p thoracentesis with 1.4 L fluid removed 6/10 - chest tube removed 6/12 - s/p right chest tube placement with 3.1 L drained 6/13 - transferred from [facility] to [facility] for initiation of palliative XRT, TPN started 6/14 - diet advanced from NPO to Regular with 7 grams/meal restriction in place 6/15- XRT simulation + first session XRT 6/18- R-sided chest tube dislodged 6/19- bilateral chest tubes placed d/t worsening respiratory distress  6/29: TPN stopped 7/1: TPN resumed  Plan is for IR guided thoracic duct ligation 7/3.  Calorie Count results 6/30: B: 477 kcals, 17g protein L: documentation not available in EPIC D: 170 kcals, 7g protein Supplements: 3 Boost Breeze +1 Ensure Max ( 900 kcals, 57g protein)  Total: 1547 kcals (67% of needs), 81g protein (57% of needs).  TPN resumed  7/1 given pt not meeting nutritional needs via PO.  Admission weight: 141 lbs. Current weight: 107 lbs  Medications: Colace, Multivitamin with minerals daily, Octreotide, Miralax  Labs reviewed: CBGs: 159-256 Low Na Low Phos  Diet Order:   Diet Order             Diet NPO time specified  Diet effective midnight           Diet NPO time specified Except for: Sips with Meds  Diet effective midnight           Diet regular Room service appropriate? Yes; Fluid consistency: Thin  Diet effective now                   EDUCATION NEEDS:   Education needs have been addressed  Skin:  Skin Assessment: Skin Integrity Issues: Skin Integrity Issues:: Stage II Stage I: sacrum Stage II: sacrum and R back  Last BM:  6/30  Height:   Ht Readings from Last 1 Encounters:  02/23/2022 '5\' 5"'$  (1.651 m)    Weight:   Wt Readings from Last 1 Encounters:  03/16/22 48.7 kg    Ideal Body Weight:  56.8 kg  BMI:  Body mass index is 17.87 kg/m.  Estimated Nutritional Needs:   Kcal:  4696-2952  Protein:  140-160 grams  Fluid:  >2.5 L  Clayton Bibles, MS, RD, LDN Inpatient Clinical Dietitian Contact information available via Amion

## 2022-03-16 NOTE — Progress Notes (Signed)
Assisted patient (minimal assist, extra assist for managing CT and PICC lines) to [facility].  Patient's abdomen noticed to be taut and tender to palpation, and comlains of gas pain.  Simethicone administered.  Patient claims she had small BM the night before last night.  MD notified.  Angie Fava, RN

## 2022-03-16 NOTE — Progress Notes (Signed)
   03/16/22 1232  Assess: MEWS Score  Temp 98.3 F (36.8 C)  BP 104/86  MAP (mmHg) 93  Pulse Rate (!) 103  Resp (!) 26  Level of Consciousness Alert  SpO2 99 %  O2 Device Nasal Cannula  O2 Flow Rate (L/min) 2 L/min  Assess: MEWS Score  MEWS Temp 0  MEWS Systolic 0  MEWS Pulse 1  MEWS RR 2  MEWS LOC 0  MEWS Score 3  MEWS Score Color Yellow  Assess: if the MEWS score is Yellow or Red  Were vital signs taken at a resting state? Yes  Focused Assessment No change from prior assessment  Does the patient meet 2 or more of the SIRS criteria? Yes  Does the patient have a confirmed or suspected source of infection? Yes  Provider and Rapid Response Notified? No  MEWS guidelines implemented *See Row Information* No, previously yellow, continue vital signs every 4 hours  Assess: SIRS CRITERIA  SIRS Temperature  0  SIRS Pulse 1  SIRS Respirations  1  SIRS WBC 0  SIRS Score Sum  2

## 2022-03-16 NOTE — Progress Notes (Signed)
  Progress Note   Patient: [name] IFO:[id number] DOB: 12/28/1960 DOA: 03/04/2022     30 DOS: the patient was seen and examined on 03/16/2022 at 8:24AM      Brief hospital course: [name] is a 61 y.o. F with dCHF, HTN and CLL who presented to [facility] with respiratory distress, became unresponsive and agonal.    Long and complicated hospitalization since then due to difficult to manage chylothorax.   IR, Oncology, and Pulmonary involved.  Plan for thoracic duct embolization next week.       Assessment and Plan: * Septic shock ([place]) - Continue cefoxitin  Acute respiratory failure with hypoxia (HCC) Improved, stable on supplemental O2 by [place]  Bilateral chylothorax - Consult IR and Pulmonology - Continue octreotide  Hypotension - Continue midodrine  Hypokalemia    Acute metabolic encephalopathy Resolved   DM type 2 (diabetes mellitus, type 2) (HCC) Glucoses good - Continue SS correction insulin - Continue glargine  Small cell B-cell lymphoma (HCC)    Hyperkalemia    Pressure injury of skin    Protein-calorie malnutrition, severe - Continue TPN  Chronic diastolic heart failure (HCC) Appears close to euvolemic  CLL (chronic lymphocytic leukemia) (HCC) - Continue sirolimus and prednisone  Essential hypertension BP soft - Continue midodrine          Subjective: She had good output from her chest tubes yesterday.  Today she has had some occasional feeling like she had trouble catching her breath, but at the moment does not feel dyspneic, and overall has had no respiratory distress, chest pain, confusion, fever.     Physical Exam: Vitals:   03/16/22 0430 03/16/22 0500 03/16/22 0857 03/16/22 1232  BP: 100/80  105/74 104/86  Pulse: 100  (!) 105 (!) 103  Resp: (!) 28  (!) 33 (!) 26  Temp: 99.3 F (37.4 C)  98.9 F (37.2 C) 98.3 F (36.8 C)  TempSrc: Oral  Oral   SpO2: 90%  94% 99%  Weight:  48.7 kg    Height:       Frail elderly adult  female, lying in bed, appears debilitated Heart rate elevated, no murmurs appreciated, mild nonpitting edema diffusely Respiratory effort weak, no wheezing, no rales, diffusely diminished, chest tubes in place bilaterally, with milky output Abdomen soft without tenderness palpation, mild tenderness probably in the lower abdomen, but no guarding or rebound Attention normal, affect appropriate, judgment and insight appear normal, moves upper extremities normally, speech fluent, face symmetric    Data Reviewed: Basic metabolic panel shows low sodium, stable creatinine AST and ALT are stable White blood cell count 28 no change, hemoglobin 11, no change Phosphate low, magnesium normal      Disposition: Status is: Inpatient         Author: Edwin Dada, MD 03/16/2022 12:54 PM  For on call review [email or web address].

## 2022-03-17 ENCOUNTER — Encounter (HOSPITAL_COMMUNITY): Admission: EM | Disposition: E | Payer: Self-pay | Source: Home / Self Care | Attending: Internal Medicine

## 2022-03-17 ENCOUNTER — Inpatient Hospital Stay (HOSPITAL_COMMUNITY): Payer: Medicaid Other

## 2022-03-17 ENCOUNTER — Inpatient Hospital Stay (HOSPITAL_COMMUNITY): Payer: Medicaid Other | Admitting: Certified Registered Nurse Anesthetist

## 2022-03-17 ENCOUNTER — Other Ambulatory Visit (HOSPITAL_COMMUNITY): Payer: Self-pay

## 2022-03-17 ENCOUNTER — Other Ambulatory Visit (HOSPITAL_COMMUNITY): Payer: Self-pay | Admitting: Interventional Radiology

## 2022-03-17 ENCOUNTER — Encounter (HOSPITAL_COMMUNITY): Payer: Self-pay | Admitting: Pulmonary Disease

## 2022-03-17 DIAGNOSIS — A419 Sepsis, unspecified organism: Secondary | ICD-10-CM | POA: Diagnosis not present

## 2022-03-17 DIAGNOSIS — J9601 Acute respiratory failure with hypoxia: Secondary | ICD-10-CM | POA: Diagnosis not present

## 2022-03-17 DIAGNOSIS — J940 Chylous effusion: Secondary | ICD-10-CM

## 2022-03-17 DIAGNOSIS — F17200 Nicotine dependence, unspecified, uncomplicated: Secondary | ICD-10-CM | POA: Diagnosis not present

## 2022-03-17 DIAGNOSIS — I10 Essential (primary) hypertension: Secondary | ICD-10-CM

## 2022-03-17 DIAGNOSIS — E1151 Type 2 diabetes mellitus with diabetic peripheral angiopathy without gangrene: Secondary | ICD-10-CM | POA: Diagnosis not present

## 2022-03-17 DIAGNOSIS — G9341 Metabolic encephalopathy: Secondary | ICD-10-CM | POA: Diagnosis not present

## 2022-03-17 HISTORY — PX: IR US GUIDE VASC ACCESS LEFT: IMG2389

## 2022-03-17 HISTORY — PX: IR US GUIDE VASC ACCESS RIGHT: IMG2390

## 2022-03-17 HISTORY — PX: IR FLUORO GUIDE CV LINE RIGHT: IMG2283

## 2022-03-17 HISTORY — PX: RADIOLOGY WITH ANESTHESIA: SHX6223

## 2022-03-17 HISTORY — PX: IR EMBO VENOUS NOT HEMORR HEMANG  INC GUIDE ROADMAPPING: IMG5447

## 2022-03-17 HISTORY — PX: IR LYMPHANGIOGRAM PEL/ABD BILAT: IMG5782

## 2022-03-17 LAB — POCT I-STAT, CHEM 8
BUN: 21 mg/dL (ref 8–23)
Calcium, Ion: 1.08 mmol/L — ABNORMAL LOW (ref 1.15–1.40)
Chloride: 99 mmol/L (ref 98–111)
Creatinine, Ser: <0.2 mg/dL — ABNORMAL LOW (ref 0.44–1.00)
Glucose, Bld: 86 mg/dL (ref 70–99)
HCT: 28 % — ABNORMAL LOW (ref 36.0–46.0)
Hemoglobin: 9.5 g/dL — ABNORMAL LOW (ref 12.0–15.0)
Potassium: 3.4 mmol/L — ABNORMAL LOW (ref 3.5–5.1)
Sodium: 136 mmol/L (ref 135–145)
TCO2: 25 mmol/L (ref 22–32)

## 2022-03-17 LAB — CBC WITH DIFFERENTIAL/PLATELET
Abs Immature Granulocytes: 0.09 10*3/uL — ABNORMAL HIGH (ref 0.00–0.07)
Basophils Absolute: 0 10*3/uL (ref 0.0–0.1)
Basophils Relative: 0 %
Eosinophils Absolute: 0 10*3/uL (ref 0.0–0.5)
Eosinophils Relative: 0 %
HCT: 31.1 % — ABNORMAL LOW (ref 36.0–46.0)
Hemoglobin: 10.1 g/dL — ABNORMAL LOW (ref 12.0–15.0)
Immature Granulocytes: 0 %
Lymphocytes Relative: 81 %
Lymphs Abs: 19.6 10*3/uL — ABNORMAL HIGH (ref 0.7–4.0)
MCH: 30.1 pg (ref 26.0–34.0)
MCHC: 32.5 g/dL (ref 30.0–36.0)
MCV: 92.8 fL (ref 80.0–100.0)
Monocytes Absolute: 0.2 10*3/uL (ref 0.1–1.0)
Monocytes Relative: 1 %
Neutro Abs: 4.4 10*3/uL (ref 1.7–7.7)
Neutrophils Relative %: 18 %
Platelets: 156 10*3/uL (ref 150–400)
RBC: 3.35 MIL/uL — ABNORMAL LOW (ref 3.87–5.11)
RDW: 19.1 % — ABNORMAL HIGH (ref 11.5–15.5)
WBC: 24.4 10*3/uL — ABNORMAL HIGH (ref 4.0–10.5)
nRBC: 0 % (ref 0.0–0.2)

## 2022-03-17 LAB — GLUCOSE, CAPILLARY
Glucose-Capillary: 107 mg/dL — ABNORMAL HIGH (ref 70–99)
Glucose-Capillary: 177 mg/dL — ABNORMAL HIGH (ref 70–99)
Glucose-Capillary: 191 mg/dL — ABNORMAL HIGH (ref 70–99)
Glucose-Capillary: 200 mg/dL — ABNORMAL HIGH (ref 70–99)
Glucose-Capillary: 225 mg/dL — ABNORMAL HIGH (ref 70–99)
Glucose-Capillary: 252 mg/dL — ABNORMAL HIGH (ref 70–99)
Glucose-Capillary: 88 mg/dL (ref 70–99)
Glucose-Capillary: 96 mg/dL (ref 70–99)

## 2022-03-17 LAB — CBC
HCT: 32.9 % — ABNORMAL LOW (ref 36.0–46.0)
Hemoglobin: 10.5 g/dL — ABNORMAL LOW (ref 12.0–15.0)
MCH: 30.2 pg (ref 26.0–34.0)
MCHC: 31.9 g/dL (ref 30.0–36.0)
MCV: 94.5 fL (ref 80.0–100.0)
Platelets: 164 10*3/uL (ref 150–400)
RBC: 3.48 MIL/uL — ABNORMAL LOW (ref 3.87–5.11)
RDW: 19.4 % — ABNORMAL HIGH (ref 11.5–15.5)
WBC: 24.1 10*3/uL — ABNORMAL HIGH (ref 4.0–10.5)
nRBC: 0 % (ref 0.0–0.2)

## 2022-03-17 LAB — ABO/RH: ABO/RH(D): O POS

## 2022-03-17 LAB — POCT I-STAT 7, (LYTES, BLD GAS, ICA,H+H)
Acid-Base Excess: 3 mmol/L — ABNORMAL HIGH (ref 0.0–2.0)
Bicarbonate: 28.1 mmol/L — ABNORMAL HIGH (ref 20.0–28.0)
Calcium, Ion: 1.03 mmol/L — ABNORMAL LOW (ref 1.15–1.40)
HCT: 26 % — ABNORMAL LOW (ref 36.0–46.0)
Hemoglobin: 8.8 g/dL — ABNORMAL LOW (ref 12.0–15.0)
O2 Saturation: 99 %
Patient temperature: 36.8
Potassium: 3.3 mmol/L — ABNORMAL LOW (ref 3.5–5.1)
Sodium: 137 mmol/L (ref 135–145)
TCO2: 29 mmol/L (ref 22–32)
pCO2 arterial: 44.6 mmHg (ref 32–48)
pH, Arterial: 7.407 (ref 7.35–7.45)
pO2, Arterial: 143 mmHg — ABNORMAL HIGH (ref 83–108)

## 2022-03-17 LAB — MAGNESIUM: Magnesium: 1.7 mg/dL (ref 1.7–2.4)

## 2022-03-17 LAB — COMPREHENSIVE METABOLIC PANEL
ALT: 56 U/L — ABNORMAL HIGH (ref 0–44)
AST: 30 U/L (ref 15–41)
Albumin: 1.6 g/dL — ABNORMAL LOW (ref 3.5–5.0)
Alkaline Phosphatase: 143 U/L — ABNORMAL HIGH (ref 38–126)
Anion gap: 5 (ref 5–15)
BUN: 28 mg/dL — ABNORMAL HIGH (ref 8–23)
CO2: 29 mmol/L (ref 22–32)
Calcium: 7.7 mg/dL — ABNORMAL LOW (ref 8.9–10.3)
Chloride: 100 mmol/L (ref 98–111)
Creatinine, Ser: 0.34 mg/dL — ABNORMAL LOW (ref 0.44–1.00)
GFR, Estimated: >60 mL/min (ref >60)
Glucose, Bld: 193 mg/dL — ABNORMAL HIGH (ref 70–99)
Potassium: 4.2 mmol/L (ref 3.5–5.1)
Sodium: 134 mmol/L — ABNORMAL LOW (ref 135–145)
Total Bilirubin: 0.2 mg/dL — ABNORMAL LOW (ref 0.3–1.2)
Total Protein: 4.4 g/dL — ABNORMAL LOW (ref 6.5–8.1)

## 2022-03-17 LAB — PREPARE RBC (CROSSMATCH)

## 2022-03-17 LAB — PROTIME-INR
INR: 1 (ref 0.8–1.2)
INR: 1 (ref 0.8–1.2)
Prothrombin Time: 12.7 seconds (ref 11.4–15.2)
Prothrombin Time: 12.8 seconds (ref 11.4–15.2)

## 2022-03-17 LAB — BASIC METABOLIC PANEL
Anion gap: 7 (ref 5–15)
BUN: 31 mg/dL — ABNORMAL HIGH (ref 8–23)
CO2: 28 mmol/L (ref 22–32)
Calcium: 7.4 mg/dL — ABNORMAL LOW (ref 8.9–10.3)
Chloride: 98 mmol/L (ref 98–111)
Creatinine, Ser: 0.39 mg/dL — ABNORMAL LOW (ref 0.44–1.00)
GFR, Estimated: >60 mL/min (ref >60)
Glucose, Bld: 169 mg/dL — ABNORMAL HIGH (ref 70–99)
Potassium: 4.6 mmol/L (ref 3.5–5.1)
Sodium: 133 mmol/L — ABNORMAL LOW (ref 135–145)

## 2022-03-17 LAB — PHOSPHORUS: Phosphorus: 2.2 mg/dL — ABNORMAL LOW (ref 2.5–4.6)

## 2022-03-17 LAB — MRSA NEXT GEN BY PCR, NASAL: MRSA by PCR Next Gen: NOT DETECTED

## 2022-03-17 LAB — LACTATE DEHYDROGENASE: LDH: 153 U/L (ref 98–192)

## 2022-03-17 LAB — TRIGLYCERIDES: Triglycerides: 77 mg/dL (ref <150)

## 2022-03-17 SURGERY — IR WITH ANESTHESIA
Anesthesia: General

## 2022-03-17 MED ORDER — PROPOFOL 10 MG/ML IV BOLUS
INTRAVENOUS | Status: DC | PRN
  Administered 2022-03-17: 60 mg via INTRAVENOUS

## 2022-03-17 MED ORDER — CHLORHEXIDINE GLUCONATE 0.12 % MT SOLN
OROMUCOSAL | Status: AC
  Administered 2022-03-17: 15 mL
  Filled 2022-03-17: qty 15

## 2022-03-17 MED ORDER — LIDOCAINE 2% (20 MG/ML) 5 ML SYRINGE
INTRAMUSCULAR | Status: DC | PRN
  Administered 2022-03-17: 20 mg via INTRAVENOUS

## 2022-03-17 MED ORDER — ONDANSETRON HCL 4 MG/2ML IJ SOLN
INTRAMUSCULAR | Status: AC
  Filled 2022-03-17: qty 2

## 2022-03-17 MED ORDER — FENTANYL CITRATE (PF) 100 MCG/2ML IJ SOLN
INTRAMUSCULAR | Status: AC
  Filled 2022-03-17: qty 2

## 2022-03-17 MED ORDER — FENTANYL CITRATE (PF) 100 MCG/2ML IJ SOLN
25.0000 ug | INTRAMUSCULAR | Status: DC | PRN
  Administered 2022-03-17 (×4): 25 ug via INTRAVENOUS

## 2022-03-17 MED ORDER — ALBUMIN HUMAN 5 % IV SOLN: INTRAVENOUS | Status: DC | PRN

## 2022-03-17 MED ORDER — ONDANSETRON HCL 4 MG/2ML IJ SOLN
4.0000 mg | Freq: Once | INTRAMUSCULAR | Status: DC
Start: 2022-03-17 — End: 2022-03-17

## 2022-03-17 MED ORDER — SUGAMMADEX SODIUM 200 MG/2ML IV SOLN
INTRAVENOUS | Status: DC | PRN
  Administered 2022-03-17 (×2): 100 mg via INTRAVENOUS

## 2022-03-17 MED ORDER — DEXTROSE-NACL 5-0.9 % IV SOLN
INTRAVENOUS | Status: AC
  Filled 2022-03-17: qty 1000

## 2022-03-17 MED ORDER — PROMETHAZINE HCL 25 MG/ML IJ SOLN
INTRAMUSCULAR | Status: AC
  Filled 2022-03-17: qty 1

## 2022-03-17 MED ORDER — FENTANYL CITRATE (PF) 100 MCG/2ML IJ SOLN
INTRAMUSCULAR | Status: AC
  Administered 2022-03-17: 50 ug
  Filled 2022-03-17: qty 2

## 2022-03-17 MED ORDER — PROMETHAZINE HCL 25 MG/ML IJ SOLN
6.2500 mg | Freq: Once | INTRAMUSCULAR | Status: AC
  Administered 2022-03-17: 6.25 mg via INTRAVENOUS

## 2022-03-17 MED ORDER — LIPIODOL ULTRAFLUID INJECTION
10.0000 mL | Freq: Once | INTRAMUSCULAR | Status: AC
Start: 2022-03-17 — End: 2022-03-17
  Administered 2022-03-17: 10 mL

## 2022-03-17 MED ORDER — PHENYLEPHRINE HCL-NACL 20-0.9 MG/250ML-% IV SOLN
INTRAVENOUS | Status: DC | PRN
  Administered 2022-03-17: 20 ug/min via INTRAVENOUS

## 2022-03-17 MED ORDER — LACTATED RINGERS IV SOLN: INTRAVENOUS | Status: DC | PRN

## 2022-03-17 MED ORDER — LIPIODOL ULTRAFLUID INJECTION
INTRAMUSCULAR | Status: AC | PRN
  Administered 2022-03-17: 10 mL via INTRAMUSCULAR

## 2022-03-17 MED ORDER — ROCURONIUM BROMIDE 10 MG/ML (PF) SYRINGE
PREFILLED_SYRINGE | INTRAVENOUS | Status: DC | PRN
  Administered 2022-03-17 (×2): 20 mg via INTRAVENOUS
  Administered 2022-03-17: 10 mg via INTRAVENOUS
  Administered 2022-03-17: 50 mg via INTRAVENOUS
  Administered 2022-03-17: 20 mg via INTRAVENOUS

## 2022-03-17 MED ORDER — INSULIN ASPART 100 UNIT/ML IJ SOLN: 0.0000 [IU] | INTRAMUSCULAR | Status: DC | PRN

## 2022-03-17 MED ORDER — FENTANYL CITRATE (PF) 100 MCG/2ML IJ SOLN
50.0000 ug | Freq: Once | INTRAMUSCULAR | Status: AC
  Administered 2022-03-17: 50 ug via INTRAVENOUS

## 2022-03-17 MED ORDER — ORAL CARE MOUTH RINSE: 15.0000 mL | OROMUCOSAL | Status: DC | PRN

## 2022-03-17 MED ORDER — PHENYLEPHRINE 80 MCG/ML (10ML) SYRINGE FOR IV PUSH (FOR BLOOD PRESSURE SUPPORT)
PREFILLED_SYRINGE | INTRAVENOUS | Status: DC | PRN
  Administered 2022-03-17: 80 ug via INTRAVENOUS
  Administered 2022-03-17: 160 ug via INTRAVENOUS
  Administered 2022-03-17: 320 ug via INTRAVENOUS

## 2022-03-17 MED ORDER — IOHEXOL 300 MG/ML  SOLN
100.0000 mL | Freq: Once | INTRAMUSCULAR | Status: AC | PRN
  Administered 2022-03-17: 10 mL

## 2022-03-17 MED ORDER — ORAL CARE MOUTH RINSE: 15.0000 mL | OROMUCOSAL | Status: DC

## 2022-03-17 MED ORDER — SODIUM CHLORIDE 0.9 % IV SOLN
INTRAVENOUS | Status: AC
  Filled 2022-03-17: qty 1

## 2022-03-17 MED ORDER — INSULIN ASPART 100 UNIT/ML IJ SOLN
INTRAMUSCULAR | Status: AC
  Administered 2022-03-17: 6 [IU] via SUBCUTANEOUS
  Filled 2022-03-17: qty 1

## 2022-03-17 MED ORDER — HEPARIN SODIUM (PORCINE) 1000 UNIT/ML IJ SOLN
INTRAMUSCULAR | Status: DC | PRN
  Administered 2022-03-17: 5000 [IU] via INTRAVENOUS

## 2022-03-17 MED ORDER — ONDANSETRON HCL 4 MG/2ML IJ SOLN
4.0000 mg | Freq: Once | INTRAMUSCULAR | Status: AC
  Administered 2022-03-17: 4 mg via INTRAVENOUS

## 2022-03-17 MED ORDER — AMISULPRIDE (ANTIEMETIC) 5 MG/2ML IV SOLN
10.0000 mg | Freq: Once | INTRAVENOUS | Status: AC
  Administered 2022-03-17: 10 mg via INTRAVENOUS

## 2022-03-17 MED ORDER — AMISULPRIDE (ANTIEMETIC) 5 MG/2ML IV SOLN
INTRAVENOUS | Status: AC
  Filled 2022-03-17: qty 4

## 2022-03-17 MED ORDER — DEXAMETHASONE SODIUM PHOSPHATE 10 MG/ML IJ SOLN
INTRAMUSCULAR | Status: DC | PRN
  Administered 2022-03-17: 5 mg via INTRAVENOUS

## 2022-03-17 MED ORDER — TRAVASOL 10 % IV SOLN
INTRAVENOUS | Status: AC
  Filled 2022-03-17: qty 1500

## 2022-03-17 MED ORDER — SODIUM PHOSPHATES 45 MMOLE/15ML IV SOLN
30.0000 mmol | Freq: Once | INTRAVENOUS | Status: AC
  Administered 2022-03-17: 30 mmol via INTRAVENOUS
  Filled 2022-03-17: qty 10

## 2022-03-17 MED ORDER — PROCHLORPERAZINE EDISYLATE 10 MG/2ML IJ SOLN
5.0000 mg | Freq: Four times a day (QID) | INTRAMUSCULAR | Status: DC | PRN
  Administered 2022-03-17: 10 mg via INTRAVENOUS
  Administered 2022-03-21: 5 mg via INTRAVENOUS
  Administered 2022-03-21 – 2022-03-24 (×3): 10 mg via INTRAVENOUS
  Filled 2022-03-17 (×5): qty 2

## 2022-03-17 MED ORDER — ONDANSETRON HCL 4 MG/2ML IJ SOLN
INTRAMUSCULAR | Status: DC | PRN
  Administered 2022-03-17: 4 mg via INTRAVENOUS

## 2022-03-17 MED ORDER — INSULIN ASPART 100 UNIT/ML IJ SOLN
0.0000 [IU] | INTRAMUSCULAR | Status: DC
  Administered 2022-03-17: 4 [IU] via SUBCUTANEOUS
  Administered 2022-03-17: 11 [IU] via SUBCUTANEOUS
  Administered 2022-03-18: 15 [IU] via SUBCUTANEOUS
  Administered 2022-03-18: 4 [IU] via SUBCUTANEOUS
  Administered 2022-03-18 (×2): 7 [IU] via SUBCUTANEOUS
  Administered 2022-03-18: 3 [IU] via SUBCUTANEOUS
  Administered 2022-03-18: 7 [IU] via SUBCUTANEOUS
  Administered 2022-03-19: 4 [IU] via SUBCUTANEOUS
  Administered 2022-03-19: 7 [IU] via SUBCUTANEOUS
  Administered 2022-03-19: 4 [IU] via SUBCUTANEOUS
  Administered 2022-03-19 (×2): 7 [IU] via SUBCUTANEOUS
  Administered 2022-03-19: 4 [IU] via SUBCUTANEOUS
  Administered 2022-03-20: 7 [IU] via SUBCUTANEOUS
  Administered 2022-03-20: 4 [IU] via SUBCUTANEOUS
  Administered 2022-03-20: 7 [IU] via SUBCUTANEOUS
  Administered 2022-03-20 – 2022-03-21 (×3): 4 [IU] via SUBCUTANEOUS
  Administered 2022-03-21: 7 [IU] via SUBCUTANEOUS
  Administered 2022-03-21: 4 [IU] via SUBCUTANEOUS
  Administered 2022-03-21 (×2): 3 [IU] via SUBCUTANEOUS
  Administered 2022-03-21 – 2022-03-22 (×7): 4 [IU] via SUBCUTANEOUS
  Administered 2022-03-23: 7 [IU] via SUBCUTANEOUS
  Administered 2022-03-23: 3 [IU] via SUBCUTANEOUS
  Administered 2022-03-23 (×3): 4 [IU] via SUBCUTANEOUS
  Administered 2022-03-24 (×2): 7 [IU] via SUBCUTANEOUS
  Administered 2022-03-24 (×3): 3 [IU] via SUBCUTANEOUS
  Administered 2022-03-24: 7 [IU] via SUBCUTANEOUS
  Administered 2022-03-25: 3 [IU] via SUBCUTANEOUS
  Administered 2022-03-25: 4 [IU] via SUBCUTANEOUS
  Administered 2022-03-25: 3 [IU] via SUBCUTANEOUS
  Administered 2022-03-25: 7 [IU] via SUBCUTANEOUS
  Administered 2022-03-25: 4 [IU] via SUBCUTANEOUS

## 2022-03-17 MED ORDER — MAGNESIUM SULFATE 2 GM/50ML IV SOLN
2.0000 g | Freq: Once | INTRAVENOUS | Status: AC
  Administered 2022-03-17: 2 g via INTRAVENOUS
  Filled 2022-03-17: qty 50

## 2022-03-17 NOTE — Progress Notes (Signed)
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 Weight 64kg on admit and now 48.7 kg  Assessment:  12 YOF with pertinent PMH of CLL, depression, anxiety, HLD, HTN presents to [facility] ED on 6/2 unresponsive.  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 Patient also has bilateral chylothorax with high output. Pharmacy has been consulted for TPN.  Patient transferred to [facility] on 7/3 for thoracic duct embolization.  Informed that TPN was turned off because PICC line was removed for the procedure.  A femoral line will be placed.  The procedure will be long and IR staff would administer meds; likely won't resume prior diet after the procedure.  Glucose / Insulin: new onset DM2 in addition to steroids, A1c 6.5% CBGs elevated Used 20 units SSI, Semglee 8/d Remains on prednisone 33m daily, sirolimus 272mdaily Electrolytes: Na low, K decreased to 4.2 (none in TPN) , Phos low at 2.2 post 1560m NaPhos, others WNL (Mag low normal at 1.7) Renal: SCr < 1, BUN mildly elevated at 28 Hepatic: alk phos/ALT elevated at 143/56, tbili and TG WNL, albumin 1.6 Intake / Output: UOP 1.4 ml/kg/hr, CT O/P 70m79mBM 6/30 GI Surgeries / Procedures:  7/3 thoracic duct embolization   Central access: PICC placed 02/27/22 >> to be replaced with femora line TPN start date: 6/13-6/29, resumed 7/1   Nutritional Goals:  RD Estimated Needs Total Energy Estimated Needs: 2300-2600 Total Protein Estimated Needs: 140-160 grams Total Fluid Estimated Needs: >2.5 L  Current Nutrition:  TPN Low fat diet, no more than 7g fat per meal >> NPO for procedure Boost Breeze po TID (each: 250 kCal and 9g AA) Ensure MAX Protein daily (each: 150 kCal and 30g AA)  Plan:  Start D5NS at 100 ml/hr until new  TPN bag could start (not doing D10W per protocol d/t hyperglycemia and hyponatremia).  Resume TPN at goal rate of 100 ml/hr at 1800 to provide 150g AA, 336g CHO and 57g ILE for a total of 2308 kCal, meeting 100% of needs Electrolytes in TPN: increase Na 100mE2m add back K 10mEq76mCa 5mEq/L19mncrease Mg 5mEq/L,4mcrease Phos 12.5mmol/L,15m:Ac 1:1 Add multivitamin and trace elements to TPN today - f/u in AM Increase SSI to resistant and continue Semglee 8 units SQ daily (likely won't get Semglee nor prednisone today) NaPhos 70mmol IV50m (send to IR) Mag sulfate 2gm IV (send to IR) TPN labs on Mon/Thurs, labs in AM  Avea Mcgowen D. Dilara Navarrete, PharMina MarbleBCPS, BCCCP 7/3/[place] 10:39 AM

## 2022-03-17 NOTE — Progress Notes (Signed)
Placed pt on BiPAP per MD verbal order, pt tolerating okay at this time.

## 2022-03-17 NOTE — Progress Notes (Signed)
Spoke with CCM about pt wearing bipap. CCM DR Meier wants to try pt on HHFNC to see if this will help her breathing before going to bipap. Pt also says she is nauseated and has had trouble tolerating bipap in the past. 

## 2022-03-17 NOTE — Sedation Documentation (Signed)
Quick clot applied to right femoral line site. Dressed with gauze and tegaderm

## 2022-03-17 NOTE — Anesthesia Procedure Notes (Signed)
Arterial Line Insertion Start/End7/11/2021 8:10 AM Performed by: Janace Litten, CRNA, CRNA  Preanesthetic checklist: IV checked, surgical consent, monitors and equipment checked and pre-op evaluation Lidocaine 1% used for infiltration Right, radial was placed Catheter size: 20 G Hand hygiene performed  and maximum sterile barriers used   Attempts: 1 Procedure performed without using ultrasound guided technique. Following insertion, dressing applied and Biopatch. Post procedure assessment: normal

## 2022-03-17 NOTE — Progress Notes (Signed)
Brief PCCM Progress Note  Seen after TDE. Tachypneic and drowsy.   - trial HHFNC  - keep chest tubes to suction -20 - repeat CXR tomorrow morning   [place]

## 2022-03-17 NOTE — Anesthesia Procedure Notes (Signed)
Procedure Name: Intubation Date/Time: 03/28/2022 9:40 AM  Performed by: Janace Litten, CRNAPre-anesthesia Checklist: Patient identified, Emergency Drugs available, Suction available and Patient being monitored Patient Re-evaluated:Patient Re-evaluated prior to induction Oxygen Delivery Method: Circle System Utilized Preoxygenation: Pre-oxygenation with 100% oxygen Induction Type: IV induction Ventilation: Mask ventilation without difficulty Laryngoscope Size: Mac and 3 Grade View: Grade I Tube type: Oral Tube size: 7.0 mm Number of attempts: 1 Airway Equipment and Method: Stylet and Oral airway Placement Confirmation: ETT inserted through vocal cords under direct vision, positive ETCO2 and breath sounds checked- equal and bilateral Secured at: 19 cm Tube secured with: Tape Dental Injury: Teeth and Oropharynx as per pre-operative assessment

## 2022-03-17 NOTE — H&P (Signed)
HPI:  The patient has had a H&P performed within the last 30 days, all history, medications, and exam have been reviewed. The patient denies any interval changes since the H&P.  Medications: Prior to Admission medications   Medication Sig Start Date End Date Taking? Authorizing Provider  lisinopril (ZESTRIL) 10 MG tablet TAKE 1 TABLET BY MOUTH ONCE A DAY. Patient not taking: Reported on 02/16/2022 03/20/20   Fayrene Helper, MD  Vitamin D, Ergocalciferol, (DRISDOL) 1.25 MG (50000 UNIT) CAPS capsule Take 1 capsule (50,000 Units total) by mouth every 7 (seven) days. Patient not taking: Reported on 02/16/2022 12/15/19   Perlie Mayo, NP  fluticasone [facility]) 50 MCG/ACT nasal spray Place 1 spray into the nose daily. 07/02/11 11/27/11  Fayrene Helper, MD     Vital Signs: BP 107/71 (BP Location: Right Arm)   Pulse (!) 103   Temp 98.6 F (37 C) (Oral)   Resp 18   Ht '5\' 5"'$  (1.651 m)   Wt 107 lb 5.8 oz (48.7 kg)   LMP 07/25/2016   SpO2 95%   BMI 17.87 kg/m   Physical Exam Vitals reviewed.  Constitutional:      General: She is not in acute distress.    Appearance: Normal appearance. She is not ill-appearing.  HENT:     Head: Normocephalic.  Pulmonary:     Effort: Pulmonary effort is normal.  Abdominal:     General: Abdomen is flat.  Musculoskeletal:     Cervical back: Neck supple.  Skin:    General: Skin is warm and dry.     Coloration: Skin is not jaundiced.  Neurological:     Mental Status: She is alert and oriented to person, place, and time.  Psychiatric:        Mood and Affect: Mood normal.        Behavior: Behavior normal.     Mallampati Score:  MD Evaluation Airway: WNL Heart: WNL Abdomen: WNL Chest/ Lungs: WNL ASA  Classification: 3 Mallampati/Airway Score: Two  Labs:  CBC: Recent Labs    03/15/22 0315 03/16/22 0318 03/16/22 2344 03/31/2022 0352  WBC 26.6* 28.3* 24.1* 24.4*  HGB 11.6* 11.3* 10.5* 10.1*  HCT 36.1 35.3* 32.9* 31.1*  PLT 176 175  164 156    COAGS: Recent Labs    02/16/22 0318 03/16/22 2344 04/13/2022 0352  INR 1.3* 1.0 1.0  APTT 32  --   --     BMP: Recent Labs    03/15/22 1152 03/16/22 0318 03/16/22 2344 04/10/2022 0352  NA 141 132* 133* 134*  K 5.3* 5.1 4.6 4.2  CL 105 99 98 100  CO2 '31 28 28 29  '$ GLUCOSE 66* 114* 169* 193*  BUN 26* 26* 31* 28*  CALCIUM 8.0* 7.4* 7.4* 7.7*  CREATININE 0.42* 0.37* 0.39* 0.34*  GFRNONAA >60 >60 >60 >60    LIVER FUNCTION TESTS: Recent Labs    03/10/22 0346 03/13/22 0333 03/16/22 0318 04/10/2022 0352  BILITOT 0.3 0.2* 0.4 0.2*  AST 26 25 56* 30  ALT 36 31 91* 56*  ALKPHOS 65 68 175* 143*  PROT 4.0* 4.8* 4.4* 4.4*  ALBUMIN 2.1* 2.2* 1.7* 1.6*    Assessment/Plan:  61 yo female with CLL with advanced disease causing bilateral chylothorax, she presents to [facility] IR today for image-guided lymphangiogram with possible thoracic duct embolization with dr. Laurence Ferrari.  VSS, Labs reviewed.  Will proceed with lymphangiogram with possible thoracic duct embolization, sedation will be managed by anesthesia team.  Signed: Tera Mater 04/10/2022, 9:11 AM

## 2022-03-17 NOTE — Procedures (Signed)
Interventional Radiology Procedure Note  Procedure: Successful lymphangiogram and thoracic duct embolization.  Complications: None  Estimated Blood Loss: None  Recommendations: - May return to [facility] after adequate PACU recovery.  - Maintain chest tubes to LWS   Signed,  Criselda Peaches, MD

## 2022-03-17 NOTE — Transfer of Care (Signed)
Immediate Anesthesia Transfer of Care Note  Patient: [name]  Procedure(s) Performed: Thoracic duct embolization  Patient Location: PACU  Anesthesia Type:General  Level of Consciousness: awake, drowsy and patient cooperative  Airway & Oxygen Therapy: Patient Spontanous Breathing and Patient connected to face mask oxygen  Post-op Assessment: Report given to RN, Post -op Vital signs reviewed and stable and Patient moving all extremities X 4  Post vital signs: Reviewed and stable  Last Vitals:  Vitals Value Taken Time  BP 107/59 03/25/2022 1450  Temp    Pulse 93 03/20/2022 1456  Resp 32 03/29/2022 1456  SpO2 87 % 03/23/2022 1456  Vitals shown include unvalidated device data.  Last Pain:  Vitals:   04/08/2022 0410  TempSrc: Oral  PainSc:       Patients Stated Pain Goal: 5 (40/37/09 6438)  Complications: No notable events documented.

## 2022-03-17 NOTE — Plan of Care (Signed)
  Problem: Nutrition: Goal: Adequate nutrition will be maintained Outcome: Not Progressing   Problem: Coping: Goal: Level of anxiety will decrease Outcome: Not Progressing   Problem: Pain Managment: Goal: General experience of comfort will improve Outcome: Not Progressing   Problem: Skin Integrity: Goal: Risk for impaired skin integrity will decrease Outcome: Not Progressing

## 2022-03-17 NOTE — Progress Notes (Signed)
OT Cancellation Note  Patient Details Name: [name] MRN: [id number] DOB: 1961/01/09   Cancelled Treatment:    Reason Eval/Treat Not Completed: Patient at procedure or test/ unavailable (Off the floor at surgery. Will return as schedule allows.)  [place], OTR/L Acute Rehab Office: [phone] 03/15/2022, 6:53 AM

## 2022-03-17 NOTE — Progress Notes (Addendum)
Patient packed up and left with [facility] at 0603. Patient left with 2 belongings bags. Patient to sure she had her keys to her home and mailbox. Balloons left behind. Nurse reached out to daughter and son about balloons. None answered at the numbers posted in the chart.

## 2022-03-17 NOTE — Progress Notes (Signed)
Dr. Lanetta Inch with anesthesia spoke with the patient, and at this time she would like to be resuscitated in the event of an emergency. The patient was unaware at the time that her son and daughter made this verbal decision upon her arrival to the hospital on 03/06/2022. After speaking with the son and daughter, they both made this verbal decision based on her condition at the time of admission. The patient is AAOx4, and want s life saving measures at this time. The son and daughter are aware, as well.

## 2022-03-17 NOTE — Progress Notes (Signed)
Tammy from [facility] stated that patient could not bring all of her belongings (balloons) over to main [place]. Due to there being more than two balloons. Tammy suggested that a family member take the patient's belongings. Will continue to monitor.

## 2022-03-17 NOTE — Anesthesia Preprocedure Evaluation (Addendum)
Anesthesia Evaluation  Patient identified by MRN, date of birth, ID band Patient awake    Reviewed: Allergy & Precautions, NPO status , Patient's Chart, lab work & pertinent test results  Airway Mallampati: III  TM Distance: >3 FB Neck ROM: Full    Dental  (+) Teeth Intact, Dental Advisory Given   Pulmonary Current Smoker and Patient abstained from smoking.,    + rhonchi  + decreased breath sounds      Cardiovascular hypertension, Pt. on medications + Peripheral Vascular Disease  Normal cardiovascular exam Rhythm:Regular Rate:Normal  Echo: 1. Left ventricular ejection fraction, by estimation, is >75%. The left  ventricle has hyperdynamic function. The left ventricle has no regional  wall motion abnormalities. Left ventricular diastolic parameters are  consistent with Grade II diastolic  dysfunction (pseudonormalization). Elevated left atrial pressure.  2. Right ventricular systolic function is mildly reduced. The right  ventricular size is normal. Tricuspid regurgitation signal is inadequate  for assessing PA pressure.  3. A small pericardial effusion is present. Large pleural effusion in the  left lateral region.  4. The mitral valve is normal in structure. No evidence of mitral valve  regurgitation.  5. The aortic valve is tricuspid. There is mild thickening of the aortic  valve. Aortic valve regurgitation is not visualized. Aortic valve  sclerosis is present, with no evidence of aortic valve stenosis.  6. The inferior vena cava is dilated in size with <50% respiratory  variability, suggesting right atrial pressure of 15 mmHg.    Neuro/Psych PSYCHIATRIC DISORDERS Anxiety Depression    GI/Hepatic   Endo/Other  diabetes  Renal/GU      Musculoskeletal  (+) Arthritis ,   Abdominal   Peds  Hematology  (+) Blood dyscrasia (CLL c/b chylothorax), ,   Anesthesia Other Findings H/o CLL. Presented on 02/13/2022 after  being found unresponsive by EMS. CT with b/l pleural effusions, b/l axillary adenopathy, b/l axillary chest wall masses. C/f b/l chylothorax. Now extubated. B/l chest tubes with significant output. Not currently on pressors. On midodrine. On TPN.    Reproductive/Obstetrics                         Anesthesia Physical Anesthesia Plan  ASA: 4  Anesthesia Plan: General   Post-op Pain Management:    Induction: Intravenous  PONV Risk Score and Plan: 3 and Ondansetron, Dexamethasone and Midazolam  Airway Management Planned: Oral ETT  Additional Equipment: Arterial line  Intra-op Plan:   Post-operative Plan: Extubation in OR and Possible Post-op intubation/ventilation  Informed Consent: I have reviewed the patients History and Physical, chart, labs and discussed the procedure including the risks, benefits and alternatives for the proposed anesthesia with the patient or authorized representative who has indicated his/her understanding and acceptance.     Dental advisory given  Plan Discussed with: CRNA  Anesthesia Plan Comments:      Anesthesia Quick Evaluation

## 2022-03-17 NOTE — Progress Notes (Signed)
Progress Note   Patient: [name] OMV:[id number] DOB: 22-Sep-1960 DOA: 03/09/2022     31 DOS: the patient was seen and examined on 04/10/2022 at 5:25PM      Brief hospital course: Mrs. Bouffard is a 61 y.o. F with CLL and HTN who presented to [facility] on 6/2 with increased work of breathing.  In the ER, respiratory status worsened, she became hypoxic, altered and was intubated.  CT imaging showed bilateral pleural effusions and bulky diffuse lymphadenopathy, and she was transferred to [facility].      6/2: Admitted, intubated in ER, in shock, transferred to [facility] ICU on pressors 6/3: Right chest tube placed. Left thora 2000 ml removed - milky, consistent with chylothorax from CLL 6/4: Extubated, off pressors; Oncology consulted 6/6: Transferred [facility], underwent US guided R axillary LN biopsy, started on pulse dose steroids '50mg'$  pred.  6/7-8: Chest tube continues to have high output  6/10: Chest tube removed 6/12: Repeat right chest tube placement with 3.1 L drained 6/13: Transfer from [facility] to [facility] for initiation of radiotherapy 6/15: Output from chest tube remains high, octreotide started, XRT started 6/16: Started TPN  6/18: Dislodged right chest tube completely 6/19: PleurX considered but deferred due to poor candidate outpatient. Bilateral chest tubes placed due to worsening respiratory distress.  6/20: midodrine started 6/21: Growing consensus that thoracic duct embolization is required; CT surgery and IR involved in discussions for this procedure 6/22-6/24: Continues with significant output from chest tubes 6/27: Oncology started sirolimus for CLL 6/28-6/29: Chest tube output less 6/30: Lytics instilled in chest tube and ~2L output 7/3: Underwent Thoracic duct embolization by Dr. 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 Laurence Ferrari today - Chest tube placement per Pulmonology - Consult IR, appreciate expertise - Continue prednisone and sirolimus -  Continue octreotide  Acute respiratory failure with hypoxia ([place]) Due to bilateral chylothorax. 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  Underwent core needle biopsy on 6/6. This and flow cytometry consistent with her known CLL.  Started on prednisone and later sirolimus. - Continue prednisone and sirolimus   Hyperkalemia    Pressure injury of skin    Protein-calorie malnutrition, severe - Continue TPN  Chronic diastolic heart failure (HCC) Appears close to euvolemic  Septic shock (HCC) Resolved  CLL (chronic lymphocytic leukemia) (HCC) - Continue sirolimus and prednisone  Essential hypertension BP soft - Continue midodrine          Subjective: Patient is nauseated, short of breath. Weak.  No fever, sluggish and still sedated post-op. Severe dyspnea with laying flat.     Physical Exam: Vitals:   03/29/2022 1545 04/06/2022 1600 04/05/2022 1615 04/06/2022 1630  BP: 114/66 97/61 105/62 (!) 102/59  Pulse: 96 98 (!) 101 98  Resp: (!) 30 (!) 22 (!) 21 (!) 32  Temp:  98 F (36.7 C)    TempSrc:      SpO2: 100% 97% 96% 94%  Weight:      Height:       Frail elderly adult female, sitting up in bed.  Shakes head yes or no, no verbal answers. Tachycardic, regular, no murmurs, mild nonpitting edema Tachyphneic, shallow and labored breahting.  Crackles bilaterally.  Attention diminshed. Moving all extremities but sluggish.       Data Reviewed: CBC and BMP reviewed and summarized above.     Disposition: Status is: Inpatient         Author: Edwin Dada, MD 04/08/2022 5:29 PM  For on call review [email or web address].

## 2022-03-17 NOTE — Anesthesia Postprocedure Evaluation (Signed)
Anesthesia Post Note  Patient: [name]  Procedure(s) Performed: Thoracic duct embolization     Patient location during evaluation: PACU Anesthesia Type: General Level of consciousness: awake and alert Pain management: pain level controlled Vital Signs Assessment: post-procedure vital signs reviewed and stable Respiratory status: spontaneous breathing, nonlabored ventilation, respiratory function stable and patient connected to nasal cannula oxygen Cardiovascular status: blood pressure returned to baseline and stable Postop Assessment: no apparent nausea or vomiting Anesthetic complications: no   No notable events documented.  Last Vitals:  Vitals:   03/30/2022 1615 03/28/2022 1630  BP: 105/62 (!) 102/59  Pulse: (!) 101 98  Resp: (!) 21 (!) 32  Temp:    SpO2: 96% 94%    Last Pain:  Vitals:   03/25/2022 0410  TempSrc: Oral  PainSc:    Pain Goal: Patients Stated Pain Goal: 5 (03/16/22 1816)                 [name]

## 2022-03-18 ENCOUNTER — Inpatient Hospital Stay (HOSPITAL_COMMUNITY): Payer: Medicaid Other

## 2022-03-18 ENCOUNTER — Encounter (HOSPITAL_COMMUNITY): Payer: Self-pay | Admitting: Interventional Radiology

## 2022-03-18 DIAGNOSIS — J940 Chylous effusion: Secondary | ICD-10-CM | POA: Diagnosis not present

## 2022-03-18 DIAGNOSIS — A419 Sepsis, unspecified organism: Secondary | ICD-10-CM | POA: Diagnosis not present

## 2022-03-18 LAB — BLOOD GAS, VENOUS
Acid-Base Excess: 5.3 mmol/L — ABNORMAL HIGH (ref 0.0–2.0)
Bicarbonate: 31.8 mmol/L — ABNORMAL HIGH (ref 20.0–28.0)
O2 Saturation: 71.9 %
Patient temperature: 37
pCO2, Ven: 55 mmHg (ref 44–60)
pH, Ven: 7.37 (ref 7.25–7.43)
pO2, Ven: 42 mmHg (ref 32–45)

## 2022-03-18 LAB — COMPREHENSIVE METABOLIC PANEL
ALT: 42 U/L (ref 0–44)
AST: 30 U/L (ref 15–41)
Albumin: 1.9 g/dL — ABNORMAL LOW (ref 3.5–5.0)
Alkaline Phosphatase: 141 U/L — ABNORMAL HIGH (ref 38–126)
Anion gap: 7 (ref 5–15)
BUN: 21 mg/dL (ref 8–23)
CO2: 27 mmol/L (ref 22–32)
Calcium: 7.5 mg/dL — ABNORMAL LOW (ref 8.9–10.3)
Chloride: 105 mmol/L (ref 98–111)
Creatinine, Ser: <0.3 mg/dL — ABNORMAL LOW (ref 0.44–1.00)
Glucose, Bld: 283 mg/dL — ABNORMAL HIGH (ref 70–99)
Potassium: 3.7 mmol/L (ref 3.5–5.1)
Sodium: 139 mmol/L (ref 135–145)
Total Bilirubin: 0.4 mg/dL (ref 0.3–1.2)
Total Protein: 5 g/dL — ABNORMAL LOW (ref 6.5–8.1)

## 2022-03-18 LAB — GLUCOSE, CAPILLARY
Glucose-Capillary: 130 mg/dL — ABNORMAL HIGH (ref 70–99)
Glucose-Capillary: 199 mg/dL — ABNORMAL HIGH (ref 70–99)
Glucose-Capillary: 236 mg/dL — ABNORMAL HIGH (ref 70–99)
Glucose-Capillary: 238 mg/dL — ABNORMAL HIGH (ref 70–99)
Glucose-Capillary: 243 mg/dL — ABNORMAL HIGH (ref 70–99)
Glucose-Capillary: 325 mg/dL — ABNORMAL HIGH (ref 70–99)

## 2022-03-18 LAB — PHOSPHORUS: Phosphorus: 2.8 mg/dL (ref 2.5–4.6)

## 2022-03-18 LAB — MAGNESIUM: Magnesium: 2.1 mg/dL (ref 1.7–2.4)

## 2022-03-18 MED ORDER — INSULIN GLARGINE-YFGN 100 UNIT/ML ~~LOC~~ SOLN
8.0000 [IU] | Freq: Every day | SUBCUTANEOUS | Status: DC
Start: 2022-03-18 — End: 2022-03-23
  Administered 2022-03-18 – 2022-03-22 (×5): 8 [IU] via SUBCUTANEOUS
  Filled 2022-03-18 (×7): qty 0.08

## 2022-03-18 MED ORDER — FENTANYL CITRATE PF 50 MCG/ML IJ SOSY
25.0000 ug | PREFILLED_SYRINGE | INTRAMUSCULAR | Status: DC | PRN
  Administered 2022-03-18: 25 ug via INTRAVENOUS

## 2022-03-18 MED ORDER — TRAVASOL 10 % IV SOLN: INTRAVENOUS | Status: DC

## 2022-03-18 MED ORDER — ORAL CARE MOUTH RINSE
15.0000 mL | OROMUCOSAL | Status: DC
  Administered 2022-03-18 – 2022-03-21 (×9): 15 mL via OROMUCOSAL

## 2022-03-18 MED ORDER — TRAVASOL 10 % IV SOLN
INTRAVENOUS | Status: AC
  Filled 2022-03-18: qty 1500

## 2022-03-18 MED ORDER — ORAL CARE MOUTH RINSE: 15.0000 mL | OROMUCOSAL | Status: DC | PRN

## 2022-03-18 MED ORDER — ADULT MULTIVITAMIN W/MINERALS CH
1.0000 | ORAL_TABLET | Freq: Every day | ORAL | Status: DC
  Administered 2022-03-18 – 2022-03-20 (×3): 1 via ORAL
  Filled 2022-03-18 (×3): qty 1

## 2022-03-18 NOTE — Progress Notes (Addendum)
eLink Physician-Brief Progress Note Patient Name: [name] DOB: 1961/03/15 MRN: [id number]   Date of Service  03/18/2022  HPI/Events of Note  Notified of hyperglycemia with glucose in the 200s-300s.  Pt receiving TPN.  Long acting insulin not given yesterday as she was at a procedure.   The patient is s/p lymphangiogram and thoracic duct embolization on 04/01/2022.  As she was moved from the bed to the chair, the left sided chest tube was pulled out accidentally.  Pt is saturating 97-99% with no increased work of breathing.   eICU Interventions  Give long acting insulin now and continue sliding scale.  Get stat CXR.      Intervention Category Intermediate Interventions: Hyperglycemia - evaluation and treatment  Elsie Lincoln 03/18/2022, 4:03 AM  4:50 AM As per RN, little output from both chest tubes.  Pt is saturating adequately on heated high flow with sats 100%.    CXR shows more dense consolidation bilaterally compared to CXR on 03/16/22.  Pt with poor inspiratory effort on current CXR. Interstitial edema about the same.  Plan> Continue to monitor respiratory status. No indication to replace chest tube urgently.

## 2022-03-18 NOTE — Progress Notes (Signed)
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 Weight 64kg on admit and now 48.7 kg  Assessment:  64 YOF with pertinent PMH of CLL, depression, anxiety, HLD, HTN presents to [facility] ED on 6/2 unresponsive.  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 Patient also has bilateral chylothorax with high output. Pharmacy has been consulted for TPN.  Glucose / Insulin: new onset DM2 in addition to steroids, A1c 6.5% CBGs elevated Used 36 units SSI (did not receive long acting 7/3 am), Semglee 8/d Remains on prednisone 41m daily, sirolimus 215mdaily Electrolytes: improved and WNL Renal: SCr < 1 Hepatic: alk phos elevated at 141, tbili and TG WNL, albumin 1.9 Intake / Output: UOP 1900 mL, CT O/P 20 mL, LBM 6/30 GI Surgeries / Procedures:  7/3 thoracic duct embolization   Central access: PICC placed 02/27/22, replaced 7/3 TPN start date: 6/13-6/29, resumed 7/1   Nutritional Goals:  RD Estimated Needs Total Energy Estimated Needs: 2300-2600 Total Protein Estimated Needs: 140-160 grams Total Fluid Estimated Needs: >2.5 L  Current Nutrition:  TPN Low fat diet, no more than 7g fat per meal >> NPO for procedure Boost Breeze po TID (each: 250 kCal and 9g AA) Ensure MAX Protein daily (each: 150 kCal and 30g AA)  Plan:  Continue TPN at goal rate of 100 ml/hr Electrolytes in TPN: decrease Na 80 mEq/L, K 10 mEq/L, Ca 5 mEq/L, Mg 5 mEq/L, Phos 12.5 mmol/L, Cl:Ac 1:1 Remove multivitamin and trace elements from TPN and resume daily PO multivitamin patient previously receiving Continue resistant SSI and Semglee 8 units SQ daily TPN labs on Mon/Thurs  AbTawnya CrookPharmD, BCPS Clinical Pharmacist 03/18/2022 7:28 AM

## 2022-03-18 NOTE — TOC Progression Note (Signed)
Transition of Care [facility]) - Progression Note    Patient Details  Name: [name] MRN: [id number] Date of Birth: December 12, 1960  [facility] CM/SW Contact  Leeroy Cha, RN Phone Number: 03/18/2022, 8:00 AM  Clinical Narrative:    [id number]/ chart reviewed.  Following for toc needs.  Plan is to admit to [facility] for rehab.  Pt has medicaid. Only facility considering is [facility].   Expected Discharge Plan: Long Term Nursing Home Barriers to Discharge: Continued Medical Work up, Ship broker, Other (must enter comment) (LTC placement)  Expected Discharge Plan and Services Expected Discharge Plan: [facility]     Post Acute Care Choice: Nursing Home Living arrangements for the past 2 months: Single Family Home                                       Social Determinants of Health (SDOH) Interventions    Readmission Risk Interventions     No data to display

## 2022-03-18 NOTE — Progress Notes (Cosign Needed Addendum)
Supervising Physician: Jacqulynn Cadet  Patient Status:  [facility] - In-pt  Chief Complaint:  Status post lymphangiogram with thoracic duct embolization 04/08/2022 with Dr. Laurence Ferrari  Subjective:  Patient sleeping arouses easily to verbal stimuli.  Patient reports she ate her meal earlier today.  Allergies: Feraheme [ferumoxytol]  Medications: Prior to Admission medications   Medication Sig Start Date End Date Taking? Authorizing Provider  lisinopril (ZESTRIL) 10 MG tablet TAKE 1 TABLET BY MOUTH ONCE A DAY. Patient not taking: Reported on 02/16/2022 03/20/20   Fayrene Helper, MD  Vitamin D, Ergocalciferol, (DRISDOL) 1.25 MG (50000 UNIT) CAPS capsule Take 1 capsule (50,000 Units total) by mouth every 7 (seven) days. Patient not taking: Reported on 02/16/2022 12/15/19   Perlie Mayo, NP  fluticasone [facility]) 50 MCG/ACT nasal spray Place 1 spray into the nose daily. 07/02/11 11/27/11  Fayrene Helper, MD     Vital Signs: BP 138/80   Pulse (!) 116   Temp 98.1 F (36.7 C) (Oral)   Resp (!) 32   Ht '5\' 5"'$  (1.651 m)   Wt 117 lb 15.1 oz (53.5 kg)   LMP 07/25/2016   SpO2 94%   BMI 19.63 kg/m   Physical Exam Constitutional:      General: She is not in acute distress.    Appearance: She is ill-appearing.  HENT:     Nose:     Comments: High flow O2 to [place] Cardiovascular:     Rate and Rhythm: Tachycardia present.  Pulmonary:     Effort: No respiratory distress.     Comments: Right chest tube in place  No significant output noted today.  Insertion site unremarkable with sutures in place.  Dressings clean dry and intact.  Left chest tube puncture site unremarkable after patient inadvertently removed left chest tube overnight.  No significant output prior to removal.  Dressing clean dry and intact. Skin:    General: Skin is warm and dry.     Imaging: DG CHEST PORT 1 VIEW  Result Date: 03/18/2022 CLINICAL DATA:  Status post removal of left chest tube. EXAM: PORTABLE  CHEST 1 VIEW COMPARISON:  03/16/2022 FINDINGS: There is a left arm PICC line with tip in the right atrium. Stable cardiomediastinal contours. Interval removal of left chest tube. No pneumothorax identified. Right basilar chest tube is again noted. Interval increase opacification overlying both lungs is identified compared with 03/16/2022 which may reflect progressive bilateral pleural effusions, airspace disease and or atelectasis. IMPRESSION: 1. No pneumothorax status post left chest tube removal. 2. Increase opacification of both lungs compared with 03/16/2022 which may reflect progressive bilateral pleural effusions, airspace disease and/or atelectasis. Electronically Signed   By: Kerby Moors M.D.   On: 03/18/2022 05:15   IR EMBO VENOUS NOT HEMORR HEMANG  INC GUIDE ROADMAPPING  Result Date: 03/22/2022 INDICATION: 61 year old female with chronic lymphocytic leukemia and large volume bilateral chylous pleural effusions. She presents for lymphangiogram and thoracic duct embolization. EXAM: IR EMBO VENOUS NOT HEMORR HEMANG INC GUIDE ROADMAPPING; PICC REPLACEMENT UNDER ULTRASOUND; IR RIGHT FLUORO GUIDE CV LINE; IR ULTRASOUND GUIDANCE VASC ACCESS LEFT; LYMPHANGIOGRAM; IR ULTRASOUND GUIDANCE VASC ACCESS RIGHT 1. Ultrasound-guided placement of central venous catheter via right common femoral vein 2. Ultrasound-guided puncture right superficial inguinal lymph node 3. Ultrasound-guided puncture left superficial inguinal lymph node 4. Bilateral lymphangiogram 5. Balloon occluded embolization of thoracic duct 6. Left upper extremity PICC exchange MEDICATIONS: 2 g cefoxitin. The antibiotic was administered within 1 hour of the procedure ANESTHESIA/SEDATION:  General endotracheal anesthesia provided by the anesthesiology service. CONTRAST:  35m OMNIPAQUE IOHEXOL 300 MG/ML  SOLN FLUOROSCOPY: Radiation Exposure Index (as provided by the fluoroscopic device): 4448mGy Kerma COMPLICATIONS: None immediate. PROCEDURE: Informed  consent was obtained from the patient following explanation of the procedure, risks, benefits and alternatives. The patient understands, agrees and consents for the procedure. All questions were addressed. A time out was performed prior to the initiation of the procedure. Maximal barrier sterile technique utilized including caps, mask, sterile gowns, sterile gloves, large sterile drape, hand hygiene, and Betadine prep. The right common femoral vein was interrogated with ultrasound and found to be widely patent. An image was obtained and stored for the medical record. Local anesthesia was attained by infiltration with 1% lidocaine. A small dermatotomy was made. Under real-time sonographic guidance, the vessel was punctured with a 21 gauge micropuncture needle. Using standard technique, the initial micro needle was exchanged over a 0.018 micro wire for a transitional 4 [place] sheath. The micro sheath was then exchanged over a 0.035 wire for a fascial dilator and the soft tissue tract was dilated. An aero triple-lumen central venous catheter was then advanced over the wire and position with the tip in the right common iliac vein. The catheter flushed and aspirated easily. The catheter was capped and secured in place. Ultrasound was used to interrogate the right groin. A large pathologic lymph node with what appears to be a visible lymphatic channel was identified. Under real-time ultrasound guidance, a 25 gauge spinal needle was advanced into the vascular channel. Gentle did injection of contrast under fluoroscopy demonstrates a blob like architecture. This is not consistent with lymphatic flow. Therefore, a smaller and more normal appearing lymph nodes slightly more inferior was identified. Using the same technique, the hilum of the lymph node was punctured with a 25 gauge spinal needle. A gentle injection of Lipiodol under fluoroscopy demonstrates successful filling of lymphatic channels. The needle was secured  in place with Tegaderm. Attention was now turned to the contralateral groin. Using the same technique, a lymph node on the left was successfully accessed with ultrasound guidance. Lipiodol injection was performed under fluoroscopy. There was some extension of injected Lipiodol into the central venous system. Therefore, this lymph node was abandoned. A second lymph node was targeted and punctured. This time upon the hiatal injection there is normal opacification of the lymphatic system. Lipiodol was then slowly injected through both accessed superficial inguinal lymph nodes and a diagnostic lymphangiogram was performed. Numerous enlarged pathologic lymph nodes are identified bilaterally along the pelvic side chains and throughout the retroperitoneum. Ultimately, there was coalescence of the lymphatic system into the thoracic duct at the level of L2-L3. Once the thoracic duct was well visualized, angulation was performed to separate the atherosclerotic wall of the aorta from the thoracic duct. Attempts were then made to puncture the duct using a combination of different needles. After several attempts, catheterization was not successful. Therefore, the decision was made to attempt access of the thoracic duct at its insertion into the left subclavian vein. The patient's existing PICC line was cut and removed over a wire. A 5 French sheath was advanced into the left upper arm vein. Attempts were then made to catheterize the insertion of the thoracic duct using a combination of angiographic catheters and micro catheters. After a prolonged period, further attempts at this approach were abandoned. Attention was again turned to the thoracic duct which had re-opacified with Lipiodol. A 21 gauge trocar style needle  was employed and used to successfully puncture the duct. A transcend wire was advanced up the duct. An initial attempt to advanced the Progreat catheter into the thoracic duct was not successful. Therefore, a 0.014  CXI catheter was selected and successfully advanced over the wire and into the thoracic duct. The catheter and wire combination were advanced as far into the duct is possible. Ultimately, a Glidewire Advantage wire was parked in the duct and the CXI catheter exchange for a 2.4 [place] Progreat catheter. The Progreat catheter was then navigated more distally into the duct to the level of the lower thoracic spine. Additional lymphadenopathy utilizing direct contrast injection demonstrates a region where the main cystic duct is obliterated by pathologic lymphadenopathy. The branches ram if I into dozens of small thread like branches which then reconstitutes the main thoracic duct. Efforts were made to find a passage through this Maze of small vessels, but this was not successful. At this point, the decision was made to proceed with embolization from the given location. As we had no backstop off coils to prevent embolization through the thoracic duct into the subclavian vein, we decided to place an occlusion balloon in the left subclavian vein. A Bentson wire was advanced through the left arm access into the right heart. A 12 x 40 mm mustang balloon was advanced over the wire and positioned across the origin of the thoracic duct. The balloon was inflated to full effacement. True fill liquid embolic glue was then reconstituted in Lipiodol at a concentration of 9:1. The glue mixture was then carefully injected through the Progreat catheter in till there was successful antegrade filling of nearly the entirety of the thoracic duct. The glue was taken up to the level of the aortic arch almost to the inferior aspect of the clavicle. No further injection was performed to prevent reflux into the subclavian vein. The microcatheter was then pulled back and the remaining thoracic duct embolized with glue. The catheter was then withdrawn. After several minutes the subclavian venous balloon was deflated and removed. A new dual lumen  power injectable PICC was cut to 43 cm and advanced over the wire and position with the tip at the cavoatrial junction. The catheter flushes and aspirates easily. The catheter was capped and secured to the skin with an adhesive fixation device. IMPRESSION: 1. Successful placement of a right common femoral central venous catheter using ultrasound and fluoroscopic guidance. 2. Successful ultrasound-guided puncture of bilateral superficial inguinal lymph nodes with bilateral abdominopelvic lymphangiogram. 3. Successful liquid embolic embolization of the thoracic duct using true fill glue. 4. Exchange of left upper extremity PICC. Electronically Signed   By: Jacqulynn Cadet M.D.   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 She presents for lymphangiogram and thoracic duct embolization. EXAM: IR EMBO VENOUS NOT HEMORR HEMANG INC GUIDE ROADMAPPING; PICC REPLACEMENT UNDER ULTRASOUND; IR RIGHT FLUORO GUIDE CV LINE; IR ULTRASOUND GUIDANCE VASC ACCESS LEFT; LYMPHANGIOGRAM; IR ULTRASOUND GUIDANCE VASC ACCESS RIGHT 1. Ultrasound-guided placement of central venous catheter via right common femoral vein 2. Ultrasound-guided puncture right superficial inguinal lymph node 3. Ultrasound-guided puncture left superficial inguinal lymph node 4. Bilateral lymphangiogram 5. Balloon occluded embolization of thoracic duct 6. Left upper extremity PICC exchange MEDICATIONS: 2 g cefoxitin. The antibiotic was administered within 1 hour of the procedure ANESTHESIA/SEDATION: General endotracheal anesthesia provided by the anesthesiology service. CONTRAST:  42m  OMNIPAQUE IOHEXOL 300 MG/ML  SOLN FLUOROSCOPY: Radiation Exposure Index (as provided by the fluoroscopic device): 322 mGy Kerma COMPLICATIONS: None immediate. PROCEDURE: Informed consent was obtained from the patient following  explanation of the procedure, risks, benefits and alternatives. The patient understands, agrees and consents for the procedure. All questions were addressed. A time out was performed prior to the initiation of the procedure. Maximal barrier sterile technique utilized including caps, mask, sterile gowns, sterile gloves, large sterile drape, hand hygiene, and Betadine prep. The right common femoral vein was interrogated with ultrasound and found to be widely patent. An image was obtained and stored for the medical record. Local anesthesia was attained by infiltration with 1% lidocaine. A small dermatotomy was made. Under real-time sonographic guidance, the vessel was punctured with a 21 gauge micropuncture needle. Using standard technique, the initial micro needle was exchanged over a 0.018 micro wire for a transitional 4 [place] micro sheath. The micro sheath was then exchanged over a 0.035 wire for a fascial dilator and the soft tissue tract was dilated. An aero triple-lumen central venous catheter was then advanced over the wire and position with the tip in the right common iliac vein. The catheter flushed and aspirated easily. The catheter was capped and secured in place. Ultrasound was used to interrogate the right groin. A large pathologic lymph node with what appears to be a visible lymphatic channel was identified. Under real-time ultrasound guidance, a 25 gauge spinal needle was advanced into the vascular channel. Gentle did injection of contrast under fluoroscopy demonstrates a blob like architecture. This is not consistent with lymphatic flow. Therefore, a smaller and more normal appearing lymph nodes slightly more inferior was identified. Using the same technique, the hilum of the lymph node was punctured with a 25 gauge spinal needle. A gentle injection of Lipiodol under fluoroscopy demonstrates successful filling of lymphatic channels. The needle was secured in place with Tegaderm. Attention was now  turned to the contralateral groin. Using the same technique, a lymph node on the left was successfully accessed with ultrasound guidance. Lipiodol injection was performed under fluoroscopy. There was some extension of injected Lipiodol into the central venous system. Therefore, this lymph node was abandoned. A second lymph node was targeted and punctured. This time upon the hiatal injection there is normal opacification of the lymphatic system. Lipiodol was then slowly injected through both accessed superficial inguinal lymph nodes and a diagnostic lymphangiogram was performed. Numerous enlarged pathologic lymph nodes are identified bilaterally along the pelvic side chains and throughout the retroperitoneum. Ultimately, there was coalescence of the lymphatic system into the thoracic duct at the level of L2-L3. Once the thoracic duct was well visualized, angulation was performed to separate the atherosclerotic wall of the aorta from the thoracic duct. Attempts were then made to puncture the duct using a combination of different needles. After several attempts, catheterization was not successful. Therefore, the decision was made to attempt access of the thoracic duct at its insertion into the left subclavian vein. The patient's existing PICC line was cut and removed over a wire. A 5 French sheath was advanced into the left upper arm vein. Attempts were then made to catheterize the insertion of the thoracic duct using a combination of angiographic catheters and micro catheters. After a prolonged period, further attempts at this approach were abandoned. Attention was again turned to the thoracic duct which had re-opacified with Lipiodol. A 21 gauge trocar style needle was employed and used to successfully puncture the duct. A transcend  wire was advanced up the duct. An initial attempt to advanced the Progreat catheter into the thoracic duct was not successful. Therefore, a 0.014 CXI catheter was selected and  successfully advanced over the wire and into the thoracic duct. The catheter and wire combination were advanced as far into the duct is possible. Ultimately, a Glidewire Advantage wire was parked in the duct and the CXI catheter exchange for a 2.4 [place] Progreat catheter. The Progreat catheter was then navigated more distally into the duct to the level of the lower thoracic spine. Additional lymphadenopathy utilizing direct contrast injection demonstrates a region where the main cystic duct is obliterated by pathologic lymphadenopathy. The branches ram if I into dozens of small thread like branches which then reconstitutes the main thoracic duct. Efforts were made to find a passage through this Maze of small vessels, but this was not successful. At this point, the decision was made to proceed with embolization from the given location. As we had no backstop off coils to prevent embolization through the thoracic duct into the subclavian vein, we decided to place an occlusion balloon in the left subclavian vein. A Bentson wire was advanced through the left arm access into the right heart. A 12 x 40 mm mustang balloon was advanced over the wire and positioned across the origin of the thoracic duct. The balloon was inflated to full effacement. True fill liquid embolic glue was then reconstituted in Lipiodol at a concentration of 9:1. The glue mixture was then carefully injected through the Progreat catheter in till there was successful antegrade filling of nearly the entirety of the thoracic duct. The glue was taken up to the level of the aortic arch almost to the inferior aspect of the clavicle. No further injection was performed to prevent reflux into the subclavian vein. The microcatheter was then pulled back and the remaining thoracic duct embolized with glue. The catheter was then withdrawn. After several minutes the subclavian venous balloon was deflated and removed. A new dual lumen power injectable PICC was cut  to 43 cm and advanced over the wire and position with the tip at the cavoatrial junction. The catheter flushes and aspirates easily. The catheter was capped and secured to the skin with an adhesive fixation device. IMPRESSION: 1. Successful placement of a right common femoral central venous catheter using ultrasound and fluoroscopic guidance. 2. Successful ultrasound-guided puncture of bilateral superficial inguinal lymph nodes with bilateral abdominopelvic lymphangiogram. 3. Successful liquid embolic embolization of the thoracic duct using true fill glue. 4. Exchange of left upper extremity PICC. Electronically Signed   By: Jacqulynn Cadet M.D.   On: 04/03/2022 15:47   IR US Guide Vasc Access Left  Result Date: 03/21/2022 INDICATION: 61 year old female with chronic lymphocytic leukemia and large volume bilateral chylous pleural effusions. She presents for lymphangiogram and thoracic duct embolization. EXAM: IR EMBO VENOUS NOT HEMORR HEMANG INC GUIDE ROADMAPPING; PICC REPLACEMENT UNDER ULTRASOUND; IR RIGHT FLUORO GUIDE CV LINE; IR ULTRASOUND GUIDANCE VASC ACCESS LEFT; LYMPHANGIOGRAM; IR ULTRASOUND GUIDANCE VASC ACCESS RIGHT 1. Ultrasound-guided placement of central venous catheter via right common femoral vein 2. Ultrasound-guided puncture right superficial inguinal lymph node 3. Ultrasound-guided puncture left superficial inguinal lymph node 4. Bilateral lymphangiogram 5. Balloon occluded embolization of thoracic duct 6. Left upper extremity PICC exchange MEDICATIONS: 2 g cefoxitin. The antibiotic was administered within 1 hour of the procedure ANESTHESIA/SEDATION: General endotracheal anesthesia provided by the anesthesiology service. CONTRAST:  43m OMNIPAQUE IOHEXOL 300 MG/ML  SOLN FLUOROSCOPY: Radiation Exposure Index (as  provided by the fluoroscopic device): 833 mGy Kerma COMPLICATIONS: None immediate. PROCEDURE: Informed consent was obtained from the patient following explanation of the procedure, risks,  benefits and alternatives. The patient understands, agrees and consents for the procedure. All questions were addressed. A time out was performed prior to the initiation of the procedure. Maximal barrier sterile technique utilized including caps, mask, sterile gowns, sterile gloves, large sterile drape, hand hygiene, and Betadine prep. The right common femoral vein was interrogated with ultrasound and found to be widely patent. An image was obtained and stored for the medical record. Local anesthesia was attained by infiltration with 1% lidocaine. A small dermatotomy was made. Under real-time sonographic guidance, the vessel was punctured with a 21 gauge micropuncture needle. Using standard technique, the initial micro needle was exchanged over a 0.018 micro wire for a transitional 4 [place] micro sheath. The micro sheath was then exchanged over a 0.035 wire for a fascial dilator and the soft tissue tract was dilated. An aero triple-lumen central venous catheter was then advanced over the wire and position with the tip in the right common iliac vein. The catheter flushed and aspirated easily. The catheter was capped and secured in place. Ultrasound was used to interrogate the right groin. A large pathologic lymph node with what appears to be a visible lymphatic channel was identified. Under real-time ultrasound guidance, a 25 gauge spinal needle was advanced into the vascular channel. Gentle did injection of contrast under fluoroscopy demonstrates a blob like architecture. This is not consistent with lymphatic flow. Therefore, a smaller and more normal appearing lymph nodes slightly more inferior was identified. Using the same technique, the hilum of the lymph node was punctured with a 25 gauge spinal needle. A gentle injection of Lipiodol under fluoroscopy demonstrates successful filling of lymphatic channels. The needle was secured in place with Tegaderm. Attention was now turned to the contralateral groin. Using  the same technique, a lymph node on the left was successfully accessed with ultrasound guidance. Lipiodol injection was performed under fluoroscopy. There was some extension of injected Lipiodol into the central venous system. Therefore, this lymph node was abandoned. A second lymph node was targeted and punctured. This time upon the hiatal injection there is normal opacification of the lymphatic system. Lipiodol was then slowly injected through both accessed superficial inguinal lymph nodes and a diagnostic lymphangiogram was performed. Numerous enlarged pathologic lymph nodes are identified bilaterally along the pelvic side chains and throughout the retroperitoneum. Ultimately, there was coalescence of the lymphatic system into the thoracic duct at the level of L2-L3. Once the thoracic duct was well visualized, angulation was performed to separate the atherosclerotic wall of the aorta from the thoracic duct. Attempts were then made to puncture the duct using a combination of different needles. After several attempts, catheterization was not successful. Therefore, the decision was made to attempt access of the thoracic duct at its insertion into the left subclavian vein. The patient's existing PICC line was cut and removed over a wire. A 5 French sheath was advanced into the left upper arm vein. Attempts were then made to catheterize the insertion of the thoracic duct using a combination of angiographic catheters and micro catheters. After a prolonged period, further attempts at this approach were abandoned. Attention was again turned to the thoracic duct which had re-opacified with Lipiodol. A 21 gauge trocar style needle was employed and used to successfully puncture the duct. A transcend wire was advanced up the duct. An initial attempt to advanced  the Progreat catheter into the thoracic duct was not successful. Therefore, a 0.014 CXI catheter was selected and successfully advanced over the wire and into the  thoracic duct. The catheter and wire combination were advanced as far into the duct is possible. Ultimately, a Glidewire Advantage wire was parked in the duct and the CXI catheter exchange for a 2.4 [place] Progreat catheter. The Progreat catheter was then navigated more distally into the duct to the level of the lower thoracic spine. Additional lymphadenopathy utilizing direct contrast injection demonstrates a region where the main cystic duct is obliterated by pathologic lymphadenopathy. The branches ram if I into dozens of small thread like branches which then reconstitutes the main thoracic duct. Efforts were made to find a passage through this Maze of small vessels, but this was not successful. At this point, the decision was made to proceed with embolization from the given location. As we had no backstop off coils to prevent embolization through the thoracic duct into the subclavian vein, we decided to place an occlusion balloon in the left subclavian vein. A Bentson wire was advanced through the left arm access into the right heart. A 12 x 40 mm mustang balloon was advanced over the wire and positioned across the origin of the thoracic duct. The balloon was inflated to full effacement. True fill liquid embolic glue was then reconstituted in Lipiodol at a concentration of 9:1. The glue mixture was then carefully injected through the Progreat catheter in till there was successful antegrade filling of nearly the entirety of the thoracic duct. The glue was taken up to the level of the aortic arch almost to the inferior aspect of the clavicle. No further injection was performed to prevent reflux into the subclavian vein. The microcatheter was then pulled back and the remaining thoracic duct embolized with glue. The catheter was then withdrawn. After several minutes the subclavian venous balloon was deflated and removed. A new dual lumen power injectable PICC was cut to 43 cm and advanced over the wire and position  with the tip at the cavoatrial junction. The catheter flushes and aspirates easily. The catheter was capped and secured to the skin with an adhesive fixation device. IMPRESSION: 1. Successful placement of a right common femoral central venous catheter using ultrasound and fluoroscopic guidance. 2. Successful ultrasound-guided puncture of bilateral superficial inguinal lymph nodes with bilateral abdominopelvic lymphangiogram. 3. Successful liquid embolic embolization of the thoracic duct using true fill glue. 4. Exchange of left upper extremity PICC. Electronically Signed   By: Jacqulynn Cadet M.D.   On: 04/02/2022 15:47   IR LYMPHANGIOGRAM PEL/ABD BILAT  Result Date: 04/12/2022 INDICATION: 62 year old female with chronic lymphocytic leukemia and large volume bilateral chylous pleural effusions. She presents for lymphangiogram and thoracic duct embolization. EXAM: IR EMBO VENOUS NOT HEMORR HEMANG INC GUIDE ROADMAPPING; PICC REPLACEMENT UNDER ULTRASOUND; IR RIGHT FLUORO GUIDE CV LINE; IR ULTRASOUND GUIDANCE VASC ACCESS LEFT; LYMPHANGIOGRAM; IR ULTRASOUND GUIDANCE VASC ACCESS RIGHT 1. Ultrasound-guided placement of central venous catheter via right common femoral vein 2. Ultrasound-guided puncture right superficial inguinal lymph node 3. Ultrasound-guided puncture left superficial inguinal lymph node 4. Bilateral lymphangiogram 5. Balloon occluded embolization of thoracic duct 6. Left upper extremity PICC exchange MEDICATIONS: 2 g cefoxitin. The antibiotic was administered within 1 hour of the procedure ANESTHESIA/SEDATION: General endotracheal anesthesia provided by the anesthesiology service. CONTRAST:  70m OMNIPAQUE IOHEXOL 300 MG/ML  SOLN FLUOROSCOPY: Radiation Exposure Index (as provided by the fluoroscopic device): 4166mGy Kerma COMPLICATIONS: None immediate. PROCEDURE: Informed  consent was obtained from the patient following explanation of the procedure, risks, benefits and alternatives. The patient  understands, agrees and consents for the procedure. All questions were addressed. A time out was performed prior to the initiation of the procedure. Maximal barrier sterile technique utilized including caps, mask, sterile gowns, sterile gloves, large sterile drape, hand hygiene, and Betadine prep. The right common femoral vein was interrogated with ultrasound and found to be widely patent. An image was obtained and stored for the medical record. Local anesthesia was attained by infiltration with 1% lidocaine. A small dermatotomy was made. Under real-time sonographic guidance, the vessel was punctured with a 21 gauge micropuncture needle. Using standard technique, the initial micro needle was exchanged over a 0.018 micro wire for a transitional 4 [place] micro sheath. The micro sheath was then exchanged over a 0.035 wire for a fascial dilator and the soft tissue tract was dilated. An aero triple-lumen central venous catheter was then advanced over the wire and position with the tip in the right common iliac vein. The catheter flushed and aspirated easily. The catheter was capped and secured in place. Ultrasound was used to interrogate the right groin. A large pathologic lymph node with what appears to be a visible lymphatic channel was identified. Under real-time ultrasound guidance, a 25 gauge spinal needle was advanced into the vascular channel. Gentle did injection of contrast under fluoroscopy demonstrates a blob like architecture. This is not consistent with lymphatic flow. Therefore, a smaller and more normal appearing lymph nodes slightly more inferior was identified. Using the same technique, the hilum of the lymph node was punctured with a 25 gauge spinal needle. A gentle injection of Lipiodol under fluoroscopy demonstrates successful filling of lymphatic channels. The needle was secured in place with Tegaderm. Attention was now turned to the contralateral groin. Using the same technique, a lymph node on the  left was successfully accessed with ultrasound guidance. Lipiodol injection was performed under fluoroscopy. There was some extension of injected Lipiodol into the central venous system. Therefore, this lymph node was abandoned. A second lymph node was targeted and punctured. This time upon the hiatal injection there is normal opacification of the lymphatic system. Lipiodol was then slowly injected through both accessed superficial inguinal lymph nodes and a diagnostic lymphangiogram was performed. Numerous enlarged pathologic lymph nodes are identified bilaterally along the pelvic side chains and throughout the retroperitoneum. Ultimately, there was coalescence of the lymphatic system into the thoracic duct at the level of L2-L3. Once the thoracic duct was well visualized, angulation was performed to separate the atherosclerotic wall of the aorta from the thoracic duct. Attempts were then made to puncture the duct using a combination of different needles. After several attempts, catheterization was not successful. Therefore, the decision was made to attempt access of the thoracic duct at its insertion into the left subclavian vein. The patient's existing PICC line was cut and removed over a wire. A 5 French sheath was advanced into the left upper arm vein. Attempts were then made to catheterize the insertion of the thoracic duct using a combination of angiographic catheters and micro catheters. After a prolonged period, further attempts at this approach were abandoned. Attention was again turned to the thoracic duct which had re-opacified with Lipiodol. A 21 gauge trocar style needle was employed and used to successfully puncture the duct. A transcend wire was advanced up the duct. An initial attempt to advanced the Progreat catheter into the thoracic duct was not successful. Therefore, a 0.014  CXI catheter was selected and successfully advanced over the wire and into the thoracic duct. The catheter and wire  combination were advanced as far into the duct is possible. Ultimately, a Glidewire Advantage wire was parked in the duct and the CXI catheter exchange for a 2.4 [place] Progreat catheter. The Progreat catheter was then navigated more distally into the duct to the level of the lower thoracic spine. Additional lymphadenopathy utilizing direct contrast injection demonstrates a region where the main cystic duct is obliterated by pathologic lymphadenopathy. The branches ram if I into dozens of small thread like branches which then reconstitutes the main thoracic duct. Efforts were made to find a passage through this Maze of small vessels, but this was not successful. At this point, the decision was made to proceed with embolization from the given location. As we had no backstop off coils to prevent embolization through the thoracic duct into the subclavian vein, we decided to place an occlusion balloon in the left subclavian vein. A Bentson wire was advanced through the left arm access into the right heart. A 12 x 40 mm mustang balloon was advanced over the wire and positioned across the origin of the thoracic duct. The balloon was inflated to full effacement. True fill liquid embolic glue was then reconstituted in Lipiodol at a concentration of 9:1. The glue mixture was then carefully injected through the Progreat catheter in till there was successful antegrade filling of nearly the entirety of the thoracic duct. The glue was taken up to the level of the aortic arch almost to the inferior aspect of the clavicle. No further injection was performed to prevent reflux into the subclavian vein. The microcatheter was then pulled back and the remaining thoracic duct embolized with glue. The catheter was then withdrawn. After several minutes the subclavian venous balloon was deflated and removed. A new dual lumen power injectable PICC was cut to 43 cm and advanced over the wire and position with the tip at the cavoatrial  junction. The catheter flushes and aspirates easily. The catheter was capped and secured to the skin with an adhesive fixation device. IMPRESSION: 1. Successful placement of a right common femoral central venous catheter using ultrasound and fluoroscopic guidance. 2. Successful ultrasound-guided puncture of bilateral superficial inguinal lymph nodes with bilateral abdominopelvic lymphangiogram. 3. Successful liquid embolic embolization of the thoracic duct using true fill glue. 4. Exchange of left upper extremity PICC. Electronically Signed   By: Jacqulynn Cadet M.D.   On: 03/24/2022 15:47   IR PICC REPLACEMENT LEFT INC IMG GUIDE  Result Date: 03/18/2022 INDICATION: 61 year old female with chronic lymphocytic leukemia and large volume bilateral chylous pleural effusions. She presents for lymphangiogram and thoracic duct embolization. EXAM: IR EMBO VENOUS NOT HEMORR HEMANG INC GUIDE ROADMAPPING; PICC REPLACEMENT UNDER ULTRASOUND; IR RIGHT FLUORO GUIDE CV LINE; IR ULTRASOUND GUIDANCE VASC ACCESS LEFT; LYMPHANGIOGRAM; IR ULTRASOUND GUIDANCE VASC ACCESS RIGHT 1. Ultrasound-guided placement of central venous catheter via right common femoral vein 2. Ultrasound-guided puncture right superficial inguinal lymph node 3. Ultrasound-guided puncture left superficial inguinal lymph node 4. Bilateral lymphangiogram 5. Balloon occluded embolization of thoracic duct 6. Left upper extremity PICC exchange MEDICATIONS: 2 g cefoxitin. The antibiotic was administered within 1 hour of the procedure ANESTHESIA/SEDATION: General endotracheal anesthesia provided by the anesthesiology service. CONTRAST:  49m OMNIPAQUE IOHEXOL 300 MG/ML  SOLN FLUOROSCOPY: Radiation Exposure Index (as provided by the fluoroscopic device): 4270mGy Kerma COMPLICATIONS: None immediate. PROCEDURE: Informed consent was obtained from the patient following explanation of the  procedure, risks, benefits and alternatives. The patient understands, agrees and  consents for the procedure. All questions were addressed. A time out was performed prior to the initiation of the procedure. Maximal barrier sterile technique utilized including caps, mask, sterile gowns, sterile gloves, large sterile drape, hand hygiene, and Betadine prep. The right common femoral vein was interrogated with ultrasound and found to be widely patent. An image was obtained and stored for the medical record. Local anesthesia was attained by infiltration with 1% lidocaine. A small dermatotomy was made. Under real-time sonographic guidance, the vessel was punctured with a 21 gauge micropuncture needle. Using standard technique, the initial micro needle was exchanged over a 0.018 micro wire for a transitional 4 [place] micro sheath. The micro sheath was then exchanged over a 0.035 wire for a fascial dilator and the soft tissue tract was dilated. An aero triple-lumen central venous catheter was then advanced over the wire and position with the tip in the right common iliac vein. The catheter flushed and aspirated easily. The catheter was capped and secured in place. Ultrasound was used to interrogate the right groin. A large pathologic lymph node with what appears to be a visible lymphatic channel was identified. Under real-time ultrasound guidance, a 25 gauge spinal needle was advanced into the vascular channel. Gentle did injection of contrast under fluoroscopy demonstrates a blob like architecture. This is not consistent with lymphatic flow. Therefore, a smaller and more normal appearing lymph nodes slightly more inferior was identified. Using the same technique, the hilum of the lymph node was punctured with a 25 gauge spinal needle. A gentle injection of Lipiodol under fluoroscopy demonstrates successful filling of lymphatic channels. The needle was secured in place with Tegaderm. Attention was now turned to the contralateral groin. Using the same technique, a lymph node on the left was successfully  accessed with ultrasound guidance. Lipiodol injection was performed under fluoroscopy. There was some extension of injected Lipiodol into the central venous system. Therefore, this lymph node was abandoned. A second lymph node was targeted and punctured. This time upon the hiatal injection there is normal opacification of the lymphatic system. Lipiodol was then slowly injected through both accessed superficial inguinal lymph nodes and a diagnostic lymphangiogram was performed. Numerous enlarged pathologic lymph nodes are identified bilaterally along the pelvic side chains and throughout the retroperitoneum. Ultimately, there was coalescence of the lymphatic system into the thoracic duct at the level of L2-L3. Once the thoracic duct was well visualized, angulation was performed to separate the atherosclerotic wall of the aorta from the thoracic duct. Attempts were then made to puncture the duct using a combination of different needles. After several attempts, catheterization was not successful. Therefore, the decision was made to attempt access of the thoracic duct at its insertion into the left subclavian vein. The patient's existing PICC line was cut and removed over a wire. A 5 French sheath was advanced into the left upper arm vein. Attempts were then made to catheterize the insertion of the thoracic duct using a combination of angiographic catheters and micro catheters. After a prolonged period, further attempts at this approach were abandoned. Attention was again turned to the thoracic duct which had re-opacified with Lipiodol. A 21 gauge trocar style needle was employed and used to successfully puncture the duct. A transcend wire was advanced up the duct. An initial attempt to advanced the Progreat catheter into the thoracic duct was not successful. Therefore, a 0.014 CXI catheter was selected and successfully advanced over the wire  and into the thoracic duct. The catheter and wire combination were advanced as  far into the duct is possible. Ultimately, a Glidewire Advantage wire was parked in the duct and the CXI catheter exchange for a 2.4 [place] Progreat catheter. The Progreat catheter was then navigated more distally into the duct to the level of the lower thoracic spine. Additional lymphadenopathy utilizing direct contrast injection demonstrates a region where the main cystic duct is obliterated by pathologic lymphadenopathy. The branches ram if I into dozens of small thread like branches which then reconstitutes the main thoracic duct. Efforts were made to find a passage through this Maze of small vessels, but this was not successful. At this point, the decision was made to proceed with embolization from the given location. As we had no backstop off coils to prevent embolization through the thoracic duct into the subclavian vein, we decided to place an occlusion balloon in the left subclavian vein. A Bentson wire was advanced through the left arm access into the right heart. A 12 x 40 mm mustang balloon was advanced over the wire and positioned across the origin of the thoracic duct. The balloon was inflated to full effacement. True fill liquid embolic glue was then reconstituted in Lipiodol at a concentration of 9:1. The glue mixture was then carefully injected through the Progreat catheter in till there was successful antegrade filling of nearly the entirety of the thoracic duct. The glue was taken up to the level of the aortic arch almost to the inferior aspect of the clavicle. No further injection was performed to prevent reflux into the subclavian vein. The microcatheter was then pulled back and the remaining thoracic duct embolized with glue. The catheter was then withdrawn. After several minutes the subclavian venous balloon was deflated and removed. A new dual lumen power injectable PICC was cut to 43 cm and advanced over the wire and position with the tip at the cavoatrial junction. The catheter flushes and  aspirates easily. The catheter was capped and secured to the skin with an adhesive fixation device. IMPRESSION: 1. Successful placement of a right common femoral central venous catheter using ultrasound and fluoroscopic guidance. 2. Successful ultrasound-guided puncture of bilateral superficial inguinal lymph nodes with bilateral abdominopelvic lymphangiogram. 3. Successful liquid embolic embolization of the thoracic duct using true fill glue. 4. Exchange of left upper extremity PICC. Electronically Signed   By: Jacqulynn Cadet M.D.   On: 04/06/2022 15:47   IR Fluoro Guide CV Line Right  Result Date: 03/29/2022 INDICATION: 61 year old female with chronic lymphocytic leukemia and large volume bilateral chylous pleural effusions. She presents for lymphangiogram and thoracic duct embolization. EXAM: IR EMBO VENOUS NOT HEMORR HEMANG INC GUIDE ROADMAPPING; PICC REPLACEMENT UNDER ULTRASOUND; IR RIGHT FLUORO GUIDE CV LINE; IR ULTRASOUND GUIDANCE VASC ACCESS LEFT; LYMPHANGIOGRAM; IR ULTRASOUND GUIDANCE VASC ACCESS RIGHT 1. Ultrasound-guided placement of central venous catheter via right common femoral vein 2. Ultrasound-guided puncture right superficial inguinal lymph node 3. Ultrasound-guided puncture left superficial inguinal lymph node 4. Bilateral lymphangiogram 5. Balloon occluded embolization of thoracic duct 6. Left upper extremity PICC exchange MEDICATIONS: 2 g cefoxitin. The antibiotic was administered within 1 hour of the procedure ANESTHESIA/SEDATION: General endotracheal anesthesia provided by the anesthesiology service. CONTRAST:  9m OMNIPAQUE IOHEXOL 300 MG/ML  SOLN FLUOROSCOPY: Radiation Exposure Index (as provided by the fluoroscopic device): 4161mGy Kerma COMPLICATIONS: None immediate. PROCEDURE: Informed consent was obtained from the patient following explanation of the procedure, risks, benefits and alternatives. The patient understands, agrees and consents  for the procedure. All questions were  addressed. A time out was performed prior to the initiation of the procedure. Maximal barrier sterile technique utilized including caps, mask, sterile gowns, sterile gloves, large sterile drape, hand hygiene, and Betadine prep. The right common femoral vein was interrogated with ultrasound and found to be widely patent. An image was obtained and stored for the medical record. Local anesthesia was attained by infiltration with 1% lidocaine. A small dermatotomy was made. Under real-time sonographic guidance, the vessel was punctured with a 21 gauge micropuncture needle. Using standard technique, the initial micro needle was exchanged over a 0.018 micro wire for a transitional 4 [place] micro sheath. The micro sheath was then exchanged over a 0.035 wire for a fascial dilator and the soft tissue tract was dilated. An aero triple-lumen central venous catheter was then advanced over the wire and position with the tip in the right common iliac vein. The catheter flushed and aspirated easily. The catheter was capped and secured in place. Ultrasound was used to interrogate the right groin. A large pathologic lymph node with what appears to be a visible lymphatic channel was identified. Under real-time ultrasound guidance, a 25 gauge spinal needle was advanced into the vascular channel. Gentle did injection of contrast under fluoroscopy demonstrates a blob like architecture. This is not consistent with lymphatic flow. Therefore, a smaller and more normal appearing lymph nodes slightly more inferior was identified. Using the same technique, the hilum of the lymph node was punctured with a 25 gauge spinal needle. A gentle injection of Lipiodol under fluoroscopy demonstrates successful filling of lymphatic channels. The needle was secured in place with Tegaderm. Attention was now turned to the contralateral groin. Using the same technique, a lymph node on the left was successfully accessed with ultrasound guidance. Lipiodol  injection was performed under fluoroscopy. There was some extension of injected Lipiodol into the central venous system. Therefore, this lymph node was abandoned. A second lymph node was targeted and punctured. This time upon the hiatal injection there is normal opacification of the lymphatic system. Lipiodol was then slowly injected through both accessed superficial inguinal lymph nodes and a diagnostic lymphangiogram was performed. Numerous enlarged pathologic lymph nodes are identified bilaterally along the pelvic side chains and throughout the retroperitoneum. Ultimately, there was coalescence of the lymphatic system into the thoracic duct at the level of L2-L3. Once the thoracic duct was well visualized, angulation was performed to separate the atherosclerotic wall of the aorta from the thoracic duct. Attempts were then made to puncture the duct using a combination of different needles. After several attempts, catheterization was not successful. Therefore, the decision was made to attempt access of the thoracic duct at its insertion into the left subclavian vein. The patient's existing PICC line was cut and removed over a wire. A 5 French sheath was advanced into the left upper arm vein. Attempts were then made to catheterize the insertion of the thoracic duct using a combination of angiographic catheters and micro catheters. After a prolonged period, further attempts at this approach were abandoned. Attention was again turned to the thoracic duct which had re-opacified with Lipiodol. A 21 gauge trocar style needle was employed and used to successfully puncture the duct. A transcend wire was advanced up the duct. An initial attempt to advanced the Progreat catheter into the thoracic duct was not successful. Therefore, a 0.014 CXI catheter was selected and successfully advanced over the wire and into the thoracic duct. The catheter and wire combination were  advanced as far into the duct is possible. Ultimately,  a Glidewire Advantage wire was parked in the duct and the CXI catheter exchange for a 2.4 [place] Progreat catheter. The Progreat catheter was then navigated more distally into the duct to the level of the lower thoracic spine. Additional lymphadenopathy utilizing direct contrast injection demonstrates a region where the main cystic duct is obliterated by pathologic lymphadenopathy. The branches ram if I into dozens of small thread like branches which then reconstitutes the main thoracic duct. Efforts were made to find a passage through this Maze of small vessels, but this was not successful. At this point, the decision was made to proceed with embolization from the given location. As we had no backstop off coils to prevent embolization through the thoracic duct into the subclavian vein, we decided to place an occlusion balloon in the left subclavian vein. A Bentson wire was advanced through the left arm access into the right heart. A 12 x 40 mm mustang balloon was advanced over the wire and positioned across the origin of the thoracic duct. The balloon was inflated to full effacement. True fill liquid embolic glue was then reconstituted in Lipiodol at a concentration of 9:1. The glue mixture was then carefully injected through the Progreat catheter in till there was successful antegrade filling of nearly the entirety of the thoracic duct. The glue was taken up to the level of the aortic arch almost to the inferior aspect of the clavicle. No further injection was performed to prevent reflux into the subclavian vein. The microcatheter was then pulled back and the remaining thoracic duct embolized with glue. The catheter was then withdrawn. After several minutes the subclavian venous balloon was deflated and removed. A new dual lumen power injectable PICC was cut to 43 cm and advanced over the wire and position with the tip at the cavoatrial junction. The catheter flushes and aspirates easily. The catheter was capped  and secured to the skin with an adhesive fixation device. IMPRESSION: 1. Successful placement of a right common femoral central venous catheter using ultrasound and fluoroscopic guidance. 2. Successful ultrasound-guided puncture of bilateral superficial inguinal lymph nodes with bilateral abdominopelvic lymphangiogram. 3. Successful liquid embolic embolization of the thoracic duct using true fill glue. 4. Exchange of left upper extremity PICC. Electronically Signed   By: Jacqulynn Cadet M.D.   On: 04/04/2022 15:47   DG CHEST PORT 1 VIEW  Result Date: 03/16/2022 CLINICAL DATA:  Bilateral chest tubes. EXAM: PORTABLE CHEST 1 VIEW COMPARISON:  Radiographs 03/15/2022.  CT 03/13/2022. FINDINGS: 0448 hours. Small caliber pigtail chest tubes remain in place bilaterally. Small residual pleural effusions bilaterally are unchanged with probable extension into the fissures on the left. Persistent bibasilar atelectasis and possible mild edema. No evidence of pneumothorax. The heart remains enlarged. There is aortic atherosclerosis. Left arm PICC extends to the level of the mid right atrium. IMPRESSION: Unchanged residual small bilateral pleural effusions. Possible mildly increased pulmonary edema. No pneumothorax. Electronically Signed   By: Richardean Sale M.D.   On: 03/16/2022 08:32   DG CHEST PORT 1 VIEW  Result Date: 03/15/2022 CLINICAL DATA:  Large volume output from chest tube. EXAM: PORTABLE CHEST 1 VIEW COMPARISON:  One-view chest x-ray 03/15/2022 at 5:37 a.m. FINDINGS: Heart is enlarged. Atherosclerotic calcifications are present at the aortic arch. The right pleural effusion is significantly decreased compared to the prior exam. No pneumothorax is present. Bilateral chest tubes are in place. The left pleural effusion is stable. Bibasilar atelectasis is  noted. IMPRESSION: 1. Interval decrease in right pleural effusion. 2. Bilateral chest tubes without pneumothorax. 3. Stable left pleural effusion and basilar  atelectasis. Electronically Signed   By: San Morelle M.D.   On: 03/15/2022 16:36   DG CHEST PORT 1 VIEW  Result Date: 03/15/2022 CLINICAL DATA:  Bilateral chest tubes. EXAM: PORTABLE CHEST 1 VIEW COMPARISON:  Radiographs 03/14/2022 and 03/12/2022.  CT 03/13/2022. FINDINGS: 0537 hours. Bilateral small caliber pigtail chest tubes are unchanged in position. The left pleural effusion has significantly decreased in volume. The right chest tube is not significantly changed with a component extending into the fissure. No evidence of pneumothorax. The heart size and mediastinal contours are stable. Left arm PICC projects to the mid right atrium. IMPRESSION: 1. Interval significant improvement in size of the left pleural effusion. 2. The right pleural effusion has not significantly changed. 3. Stable position of the bilateral chest tubes. Electronically Signed   By: Richardean Sale M.D.   On: 03/15/2022 08:56    Labs:  CBC: Recent Labs    03/15/22 0315 03/16/22 0318 03/16/22 2344 03/21/2022 0352 03/23/2022 1125 03/20/2022 1350  WBC 26.6* 28.3* 24.1* 24.4*  --   --   HGB 11.6* 11.3* 10.5* 10.1* 9.5* 8.8*  HCT 36.1 35.3* 32.9* 31.1* 28.0* 26.0*  PLT 176 175 164 156  --   --     COAGS: Recent Labs    02/16/22 0318 03/16/22 2344 03/21/2022 0352  INR 1.3* 1.0 1.0  APTT 32  --   --     BMP: Recent Labs    03/16/22 0318 03/16/22 2344 04/08/2022 0352 04/06/2022 1125 03/24/2022 1350 03/18/22 0503  NA 132* 133* 134* 136 137 139  K 5.1 4.6 4.2 3.4* 3.3* 3.7  CL 99 98 100 99  --  105  CO2 '28 28 29  '$ --   --  27  GLUCOSE 114* 169* 193* 86  --  283*  BUN 26* 31* 28* 21  --  21  CALCIUM 7.4* 7.4* 7.7*  --   --  7.5*  CREATININE 0.37* 0.39* 0.34* <0.20*  --  <0.30*  GFRNONAA >60 >60 >60  --   --  NOT CALCULATED    LIVER FUNCTION TESTS: Recent Labs    03/13/22 0333 03/16/22 0318 04/10/2022 0352 03/18/22 0503  BILITOT 0.2* 0.4 0.2* 0.4  AST 25 56* 30 30  ALT 31 91* 56* 42  ALKPHOS 68 175*  143* 141*  PROT 4.8* 4.4* 4.4* 5.0*  ALBUMIN 2.2* 1.7* 1.6* 1.9*    Assessment and Plan:  Patient lethargic but awakes easily to verbal stimuli. Patient reports eating her meal today.  Right chest tube intact with no significant output today. Left chest tube inadvertently pulled by patient overnight.  RN reports patient request no further interventions.  Please call IR with questions or concerns.  Electronically Signed: Tyson Alias, NP 03/18/2022, 2:33 PM   I spent a total of 15 Minutes at the the patient's bedside AND on the patient's hospital floor or unit, greater than 50% of which was counseling/coordinating care for status post lymphangiogram with thoracic duct embolization.

## 2022-03-18 NOTE — Progress Notes (Addendum)
NAME:  [name], MRN:  [id number], DOB:  Nov 12, 1960, LOS: 72 ADMISSION DATE:  03/14/2022, CONSULTATION DATE:  6/2 REFERRING MD:  Dr. Alvino Chapel, CHIEF COMPLAINT:  Unresponsive; Acute resp failure   BRIEF  Patient is a 61 year old female with pertinent PMH of CLL, depression, anxiety, HLD, HTN presents to [facility] ED on 6/2 unresponsive.  On 6/2 patient called EMS for abdominal pain.  Patient initially was alert and talking but became unresponsive with AMS.  Patient was hypoxic with agonal breathing and was placed on NRB.  Patient was noted to be hypotensive.  Patient transported to [facility] ED.  Upon arrival to [facility] ED patient remained unresponsive with agonal respirations.  Patient intubated for airway protection and hypoxia.  BP initially 81/65.  Given IV fluids.  Started on levo.  Fever 100.9 F and WBC 104.4.  Started on cefepime, Flagyl, Vanco.  Cultures pending.  Glucose 198, BNP 534, troponin 109 then 112, LA 4.4 then 1.8, UA unremarkable, UDS negative.  CXR with large left pleural effusion and small right pleural effusion.  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  CT abdomen with ascending colitis constipation, small intestine wall thickening possibly related to gastroenteritis; small volume ascites; extensive abdominopelvic and inguinal adenopathy.   PCCM was consulted for bilateral pleural effusion  Pertinent  Medical History    has a past medical history of Anxiety, Chronic abdominal pain, Chronic lymphocytic leukemia (CLL), B-cell ([place]) (04/04/2016), CLL (chronic lymphocytic leukemia) ([place]), Depression with anxiety (07/07/2015), Family history of hyperlipidemia (12/14/2019), H. 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   has a past surgical history that includes Surgery of left middle finger on left hand (childhood ); Tubal ligation; Cholecystectomy; Colonoscopy (02/07/2011); Hammer toe surgery (Left); Axillary lymph node biopsy (Right, 03/28/2016); Hysteroscopy with D & C (N/A, 07/30/2016); Thoracentesis (Left, 02/23/2022); Chest tube insertion (N/A, 02/18/2022); IR EMBO VENOUS NOT HEMORR HEMANG  INC GUIDE ROADMAPPING (04/01/2022); IR US Guide Vasc Access Right (04/07/2022); IR US Guide Vasc Access Left (04/13/2022); IR LYMPHANGIOGRAM PEL/ABD BILAT (03/24/2022); and IR Fluoro Guide CV Line Right (04/06/2022).  Significant Hospital Events: Including procedures, antibiotic start and stop dates in addition to other pertinent events   6/2 Presented [facility] ER unresponsive, intubated, shock on pressors > to [facility].  CT chest/abd w/ extensive LAN 6/3 Right chest tube placed. Left thora 2000 ml removed - milky 6/4 Extubated, off pressors.  Pleural fluid exudative, cx negative, lymphocytic. 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 Started on pulse dose steroids '50mg'$  pred.  6/7 Chest tube output over a liter 6/8 Left thoracentesis for 1400 cc of fluid 6/10 Chest tube removed 6/12 S/p right chest tube placement with 3.1 L drained 6/3 Tx from [facility] to [facility] 6/13 for initiation of palliative radiotherapy 6/14 No chest pain. Saturation 96% on 2 L. Chest tube drained 190 cc of milky fluid 6/15 Fell. CXR stable. Doing well. Got SIM for XRT.  RA.  Mod-large effusion on L. Refused CT. Octreotide. 6/16 TPN.  On  O2 . R CT 1000 cc out.  Started XRT 6/15, rituximab is being considered 6/17 CT issues with placement. Not sure if she wants new chest tube.  6/18 Dislodged right chest tube completely.  6/19 PleurX deferred due  to poor candidate outpatient. Bilateral chest tubes placed due to worsening respiratory distress.  6/20 - new onset hypotension after bilateral chest tube and midodrine started 6/21  2.8L out from CT in last 24 hours. IR consulted for thoracic duct embolization. Per IR: Lymphangiogram with thoracic duct embolization is not a routine procedure and will need to see when this procedure could potentially be performed 6/24-continues with significant output from chest tubes 6/25 - Afebrile .decreased chest tube output 6/26 - walked with PT in my presene.Bilateral chest tubes with draining chylothorax. IR uncertain (05/02/28) being STARTED SIROLIMUS  6/27- Last 90 min > 50cc drain of chyle on left sidde. Rt side - hardly any ouput.  Growing consensus that thoracic duct ligation is indicated based on curbside with Dr Kipp Brood surgeon and also running case by surgeon in [place], Dr Lorenso Courier of oncology. Started on sirolimus yesterday, She is eating, ambulating and also on TPN. No fevers D/wDr McCullohgh of IR - he has experience with TDE and will plan for procedure at [facility] or [facility] next few to several day. Needs General and a 6h block of time Patient agreeable for  TDE RT chest tube not draining well - new minor fissure fluid collection 6/28 -overnight dyspneic . Now on 3L [place]. LEft chst tube is leaking. Rt chest tube not much drain. TPN ongoing. Good appetie. IR considering TDE 04/07/2022 - patient aware 6/29 TPN stopped, doing calorie count. CT CAP with large bl effusions and tubes in ok position 6/30 tPA in left chest tube with good output 7/1 restarted TPN - not meeting caloric needs and wish to give her every shot possible at pleurodesis following TDE on 7/3. tPA in right chest tube 7/3 underwent TDE. Encephalopathic and increased WOB after procedure, placed on HHFNC. Dislodged left chest tube.  Interim History / Subjective:   Short of breath this morning, dislodged left chest tube overnight. Bedside US with large left pleural effusion and moderate residual loculated right pleural effusion  Objective   Blood pressure 138/80, pulse (!) 116, temperature 98.8 F (37.1 C), temperature  source Oral, resp. rate (!) 32, height '5\' 5"'$  (1.651 m), weight 53.4 kg, last menstrual period 07/25/2016, SpO2 94 %.    FiO2 (%):  [40 %-50 %] 50 %   Intake/Output Summary (Last 24 hours) at 03/18/2022 0949 Last data filed at 03/18/2022 9371 Gross per 24 hour  Intake 4580.88 ml  Output 1725 ml  Net 2855.88 ml   Filed Weights   03/15/22 0401 03/16/22 0500 03/28/2022 1752  Weight: 50.3 kg 48.7 kg 53.4 kg    I/O last 3 completed shifts: In: 4560.9 [I.V.:3770.9; IV Piggyback:760] Out: 2555 [Urine:2525; Chest Tube:30]  Intake/Output      07/03 0701 07/04 0700 07/04 0701 07/05 0700   P.O.     I.V. (mL/kg) 3770.9 (70.6) 20 (0.4)   IV Piggyback 760    Chest Tube 30    Total Intake(mL/kg) 4560.9 (85.4) 20 (0.4)   Urine (mL/kg/hr) 1475 (1.2) 300 (2)   Chest Tube 0    Total Output 1475 300   Net +3085.9 -280         Physical exam: General appearance: 61 y.o., female, distressed, chronically ill appearing  Eyes: PERRL, tracking appropriately HENT: MMM Lungs: diminished bl, tachypneic with increased respiratory effort CV: tachy RR, no murmur  Abdomen: Soft, non-tender; non-distended, BS present  Extremities: No peripheral edema, warm Skin: Normal turgor and texture; no rash Psych: Appropriate affect Neuro: drowsy arousable with verbal stim, no  focal deficit   Chest tube on right with little output, no air leak  CXR with bilateral alveolar opacities   Assessment & Plan:   Acute hypoxic respiratory failure Again likely due to reaccumulation of chylothoraces - she declines thora, chest tube in left pleural space - we can consider lytics again in right chest tube, awaiting more GoC discussion with family - HHFNC, wean FiO2 for saturation >88%  Acute encephalopathy May be from effect of anesthesia and now developing respiratory failure - check vbg - can trial NIV if needed  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 right axillary node was biopsies on 02/18/22 showing B cells consistent with her history of lymphoma and CLL - continue octreotide 02/25/22 -  - restart TPN since 02/25/22 - 03/13/22, 7/1- - continue prednisone '50mg'$  per day since 02/18/22 -  - on sirolimus since 03/10/22 - midodrine as below - tentatively scheduled for thoracic duct embolization 7/3. Hopefully as output diminishes we'll be in better position to try talc tube pleurodesis a few days after TDE - trial tPA for right chest tube - there are case reports of tPA or streptokinase for loculated chylothorax. Discussed risks/benefits and alternative of replacing pigtail. She may need this for a few days and on right side if tolerates well.  - o2 for goa pulse ox > 94%  Hypotension  - In setting of chronic disease, profound malnutrition but onset 6/20 after chest tube -continue midodrine   Severe protein calorie malnutrition  - back on TPN    Best Practice:  Goals of care: I reached out to oncology on call to discuss goals of care and her deterioration today. I have asked palliative care to see her and her family today if possible. It sounds like she is weighing comfort measures and isn't interested in more invasive procedures for her chylothoraces despite the possibility that her CLL/SLL remains treatable.     ATTESTATION & SIGNATURE   This patient is critically ill with acute hypoxic respiratory failure; which, requires frequent high complexity decision making, assessment, support, evaluation, and titration of therapies. This was completed through the application of advanced monitoring technologies and extensive interpretation of multiple databases. During this encounter critical care time was devoted to patient care services described in this note for 36 minutes.    Early

## 2022-03-18 NOTE — Progress Notes (Signed)
Nutrition Follow-up  DOCUMENTATION CODES:   Severe malnutrition in context of chronic illness, Underweight  INTERVENTION:   -TPN management per Pharmacy  -Recommend Regular diet with 7g fat per meal restriction.  -Boost Breeze po TID, each supplement provides 250 kcal and 9 grams of protein  -Ensure MAX Protein po daily, each supplement provides 150 kcal and 30 grams of protein   -Multivitamin with minerals daily  NUTRITION DIAGNOSIS:   Severe Malnutrition related to chronic illness (CLL) as evidenced by severe muscle depletion, severe fat depletion.  Ongoing.  GOAL:   Patient will meet greater than or equal to 90% of their needs  Will meet on TPN.  MONITOR:   PO intake, Supplement acceptance, Labs, Weight trends, I & O's, Other (Comment) (TPN regimen), GOC  REASON FOR ASSESSMENT:   Consult New TPN/TNA  ASSESSMENT:   61 yo female admitted with AMS and unresponsiveness. PMH includes CLL, depression, anxiety, HLD, HTN, lymphadenopathy, iron deficiency.  Significant Events: 6/02 - admitted unresponsive, intubated 6/03 - s/p right chest tube placement, s/p L thoracentesis with 2 L removed 6/04 - extubated 6/08 - s/p thoracentesis with 1.4 L fluid removed 6/10 - chest tube removed 6/12 - s/p right chest tube placement with 3.1 L drained 6/13 - transferred from [facility] to [facility] for initiation of palliative XRT, TPN started 6/14 - diet advanced from NPO to Regular with 7 grams/meal restriction in place 6/15- XRT simulation + first session XRT 6/18- R-sided chest tube dislodged 6/19- bilateral chest tubes placed d/t worsening respiratory distress  6/29: TPN stopped 7/1: TPN resumed  7/3: s/p lymphangiogram and thoracic duct embolization  Patient's CBGs are elevated. Per chart review, pt has been on prednisone. Did not receive long acting insulin 7/3 since pt was at procedure. CBGs are likely not from her PO intakes (NPO yesterday for procedure and did not order any meals).  First meal today was broth and clear liquids this morning. Needs fat restriction. Did note that palliative and oncology were asked to discuss [place] with patient. Will monitor for decisions.  Admission weight: 141 lbs. Current weight: 107 lbs  Medications: Colace, Multivitamin with minerals daily, Octreotide, Miralax  Labs reviewed: CBGs: 96-325  Diet Order:   Diet Order             Diet Carb Modified Fluid consistency: Thin; Room service appropriate? Yes  Diet effective now                   EDUCATION NEEDS:   Education needs have been addressed  Skin:  Skin Assessment: Skin Integrity Issues: Skin Integrity Issues:: Stage II Stage I: sacrum Stage II: sacrum and R back  Last BM:  6/30  Height:   Ht Readings from Last 1 Encounters:  02/17/2022 '5\' 5"'$  (1.651 m)    Weight:   Wt Readings from Last 1 Encounters:  03/18/22 53.5 kg    Ideal Body Weight:  56.8 kg  BMI:  Body mass index is 19.63 kg/m.  Estimated Nutritional Needs:   Kcal:  4098-1191  Protein:  140-160 grams  Fluid:  >2.5 L   Clayton Bibles, MS, RD, LDN Inpatient Clinical Dietitian Contact information available via Amion

## 2022-03-18 NOTE — Progress Notes (Signed)
Palliative Care Progress Note  Ms. 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 I met with Ms. Hagerman and her family including her son and daughter today at bedside to discuss her prognosis, goals of care, symptom management and possible disease trajectories. Prior to my visit I discussed her case in detail with Dr. Verlee Monte.  The current pressing decision for her is whether or not to replace the left chest tube or do a left thoracentesis. She had thoracic duct embolization yesterday in an attempt to control the effusions-this could take a few days to know if it will work or not. She is receiving radiation with a plan for chemotherapy if she can survive the pulmonary complications of her CLL until treatment can safely be started-hopefully will reduce the large adenopathy. Patient has been refusing the chest tube, her son is at bedside and wants her to not quit and to have it placed. Ms. Geving has fluctuating mental capacity-but it is clear she is getting tired and much weaker the longer this goes on.   She is now HOD#31 with multiple chest tube placements and removals.  Summary of Recommendations:  DNR, in the event she becomes pulseless or stops breathing do not initiate CPR and provide comfort at EOL.  TIME LIMITED TRIAL: Patient and family agree that she will agree to undergo one last chest tube placement to hopefully improve her dyspnea and allow for a couple more days to see if the embolization procedure will work. During this period of time if she gets worse or the the procedures does not appear to be effective in controlling the chylothorax we will discuss transitioning to comfort care and I introduced the concept of hospice care today. Both patient and family agreeable to this conversation. I additionally prepared the patient and family that she was very fragile and her condition was very serious and that her body could die at anytime from  the strain and burden of her cancer.  Should she develop distress, pain or suffering we will use any and all means to help relieve her distress so that she can be comfortable.  Will follow closely and touch base with family on Thursday (sooner if needed) depending on how she is doing to help with her care plan.   Lane Hacker, DO Palliative Medicine    Time: 50 minutes

## 2022-03-18 NOTE — Progress Notes (Signed)
Chaplain received a referral from staff to provide support for [name].  [name] was very tired and said she was recovering from a procedure.  She didn't want to talk further today, but is open to talking more tomorrow.  Chaplain will follow up in the morning.  [address], [facility] Pager, [phone]

## 2022-03-18 NOTE — Progress Notes (Addendum)
L chest tube accidentally pulled out during a transfer to the chair. No output from L chest tube all shift.  Vaseline gauze and dry gauze applied to L chest tube site.  Pt denies increased WOB and says it doesn't hurt anymore now that the tube is out.   CXR done. Elink aware throughout entire process.  Pt safely back in bed. R chest intact and still hooked to -20 suction.   RN will continue to watch for signs of fluid accumulation and increased WOB.

## 2022-03-18 NOTE — Procedures (Signed)
Insertion of Chest Tube Procedure Note  TEMEKIA CASKEY  [id number]  June 01, 1961  Date:03/18/22  Time:6:41 PM    Provider Performing: Maryjane Hurter   Procedure: Pleural Catheter Insertion w/ Imaging Guidance [phone])  Indication(s) Effusion  Consent Risks of the procedure as well as the alternatives and risks of each were explained to the patient and/or caregiver.  Consent for the procedure was obtained and is signed in the bedside chart  Anesthesia Topical only with 1% lidocaine    Time Out Verified patient identification, verified procedure, site/side was marked, verified correct patient position, special equipment/implants available, medications/allergies/relevant history reviewed, required imaging and test results available.   Sterile Technique Maximal sterile technique including full sterile barrier drape, hand hygiene, sterile gown, sterile gloves, mask, hair covering, sterile ultrasound probe cover (if used).   Procedure Description Ultrasound used to identify appropriate pleural anatomy for placement and overlying skin marked. Area of placement cleaned and draped in sterile fashion.  A 14 French pigtail pleural catheter was placed into the left pleural space using Seldinger technique. Appropriate return of fluid was obtained.  The tube was connected to atrium and placed on -20 cm H2O wall suction.   Complications/Tolerance None; patient tolerated the procedure well. Chest X-ray is ordered to verify placement.   EBL Minimal  Specimen(s) none

## 2022-03-18 NOTE — Progress Notes (Signed)
Discussed with Dr. Verlee Monte.  Her respiratory status has deteriorated overnight and she requires ICU level care.    I will follow peripherally for now, and would be happy to help in any way needed.

## 2022-03-19 ENCOUNTER — Inpatient Hospital Stay (HOSPITAL_COMMUNITY): Payer: Medicaid Other

## 2022-03-19 DIAGNOSIS — J940 Chylous effusion: Secondary | ICD-10-CM | POA: Diagnosis not present

## 2022-03-19 DIAGNOSIS — J9601 Acute respiratory failure with hypoxia: Secondary | ICD-10-CM | POA: Diagnosis not present

## 2022-03-19 LAB — CBC WITH DIFFERENTIAL/PLATELET
Abs Immature Granulocytes: 0.07 10*3/uL (ref 0.00–0.07)
Basophils Absolute: 0 10*3/uL (ref 0.0–0.1)
Basophils Relative: 0 %
Eosinophils Absolute: 0 10*3/uL (ref 0.0–0.5)
Eosinophils Relative: 0 %
HCT: 31.4 % — ABNORMAL LOW (ref 36.0–46.0)
Hemoglobin: 10 g/dL — ABNORMAL LOW (ref 12.0–15.0)
Immature Granulocytes: 0 %
Lymphocytes Relative: 73 %
Lymphs Abs: 18.5 10*3/uL — ABNORMAL HIGH (ref 0.7–4.0)
MCH: 29.7 pg (ref 26.0–34.0)
MCHC: 31.8 g/dL (ref 30.0–36.0)
MCV: 93.2 fL (ref 80.0–100.0)
Monocytes Absolute: 0.1 10*3/uL (ref 0.1–1.0)
Monocytes Relative: 0 %
Neutro Abs: 7 10*3/uL (ref 1.7–7.7)
Neutrophils Relative %: 27 %
Platelets: 144 10*3/uL — ABNORMAL LOW (ref 150–400)
RBC: 3.37 MIL/uL — ABNORMAL LOW (ref 3.87–5.11)
RDW: 19.9 % — ABNORMAL HIGH (ref 11.5–15.5)
WBC: 25.7 10*3/uL — ABNORMAL HIGH (ref 4.0–10.5)
nRBC: 0 % (ref 0.0–0.2)

## 2022-03-19 LAB — GLUCOSE, CAPILLARY
Glucose-Capillary: 187 mg/dL — ABNORMAL HIGH (ref 70–99)
Glucose-Capillary: 197 mg/dL — ABNORMAL HIGH (ref 70–99)
Glucose-Capillary: 199 mg/dL — ABNORMAL HIGH (ref 70–99)
Glucose-Capillary: 205 mg/dL — ABNORMAL HIGH (ref 70–99)
Glucose-Capillary: 213 mg/dL — ABNORMAL HIGH (ref 70–99)
Glucose-Capillary: 226 mg/dL — ABNORMAL HIGH (ref 70–99)

## 2022-03-19 LAB — LACTATE DEHYDROGENASE: LDH: 198 U/L — ABNORMAL HIGH (ref 98–192)

## 2022-03-19 MED ORDER — SODIUM CHLORIDE (PF) 0.9 % IJ SOLN
10.0000 mg | Freq: Once | INTRAMUSCULAR | Status: AC
  Administered 2022-03-19: 10 mg via INTRAPLEURAL
  Filled 2022-03-19: qty 10

## 2022-03-19 MED ORDER — TRAVASOL 10 % IV SOLN
INTRAVENOUS | Status: AC
  Filled 2022-03-19: qty 1500

## 2022-03-19 MED ORDER — SODIUM CHLORIDE 0.9% FLUSH: 10.0000 mL | Freq: Three times a day (TID) | INTRAVENOUS | Status: DC

## 2022-03-19 NOTE — Progress Notes (Signed)
Supervising Physician: Sandi Mariscal  Patient Status:  [facility] - In-pt  Chief Complaint:  Status post lymphangiogram with thoracic duct embolization 03/19/2022 with Dr. Laurence Ferrari  Subjective: Patient awake and lying in bed at time of exam. She reports feeling "terrible" and that she has had vomiting and no appetite overnight. Left sided chest tube placed by Critical Care on 7/4 after patient inadvertently removed left chest tube overnight on 7/3-7/4.   Allergies: Feraheme [ferumoxytol]  Medications: Prior to Admission medications   Medication Sig Start Date End Date Taking? Authorizing Provider  lisinopril (ZESTRIL) 10 MG tablet TAKE 1 TABLET BY MOUTH ONCE A DAY. Patient not taking: Reported on 02/16/2022 03/20/20   Fayrene Helper, MD  Vitamin D, Ergocalciferol, (DRISDOL) 1.25 MG (50000 UNIT) CAPS capsule Take 1 capsule (50,000 Units total) by mouth every 7 (seven) days. Patient not taking: Reported on 02/16/2022 12/15/19   Perlie Mayo, NP  fluticasone [facility]) 50 MCG/ACT nasal spray Place 1 spray into the nose daily. 07/02/11 11/27/11  Fayrene Helper, MD     Vital Signs: BP 131/88   Pulse (!) 109   Temp 98.6 F (37 C) (Oral)   Resp (!) 24   Ht '5\' 5"'$  (1.651 m)   Wt 117 lb 15.1 oz (53.5 kg)   LMP 07/25/2016   SpO2 98%   BMI 19.63 kg/m   Physical Exam Constitutional:      General: She is not in acute distress.    Appearance: She is ill-appearing.  HENT:     Nose:     Comments: High flow O2 to [place] Cardiovascular:     Rate and Rhythm: Tachycardia present.  Pulmonary:     Effort: No respiratory distress.     Comments: Right chest tube in place  ~900cc of orange-red fluid noted today.  Insertion site unremarkable with sutures in place.  Dressings clean dry and intact.  Left chest tube in place with ~250 cc of orange-red fluid noted today. Left chest tube site unremarkable with sutures in place. Dressing clean dry and intact. Skin:    General: Skin is warm and dry.      Imaging: DG Abd 1 View  Result Date: 03/19/2022 CLINICAL DATA:  Abdominal distension and discomfort. Recent lymphangiogram. EXAM: ABDOMEN - 1 VIEW COMPARISON:  03/20/2022 FINDINGS: Contrast noted from recent lymphangiogram. There is also contrast in the colon. Bilateral pleural drainage catheters are noted. Scattered air in the stomach, small bowel and colon but no findings for small bowel obstruction or free air. IMPRESSION: Nonobstructive bowel gas pattern. Contrast from recent lymphangiogram. Electronically Signed   By: Marijo Sanes M.D.   On: 03/19/2022 12:52   DG CHEST PORT 1 VIEW  Addendum Date: 03/19/2022   ADDENDUM REPORT: 03/19/2022 09:00 ADDENDUM: There is a kink in the left-sided chest tube at the site of the entry into the pleural space. Electronically Signed   By: Frazier Richards M.D.   On: 03/19/2022 09:00   Result Date: 03/19/2022 CLINICAL DATA:  142230 pleural effusions, bilateral chest tubes. EXAM: PORTABLE CHEST 1 VIEW COMPARISON:  March 18, 2022 FINDINGS: The heart size and mediastinal contours are stable. Again seen is the left arm PICC line with its tip in the right atrium. Bilateral chest tubes are stable. Again seen is the opacification in the upper 2/3 of the left hemithorax likely due to the loculated pleural effusion and is stable to mildly increased in the interim. The opacity in the mid region of the right  hemithorax has increased in the interim of likely increased fluid in the fissure. Stable radiopaque densities of likely related to recent thoracic duct embolization. The visualized skeletal structures are unremarkable. IMPRESSION: Likely loculated pleural effusion in the upper 2/3 of the left hemithorax is stable to mildly increased in the interim. Loculated effusion likely in the right fissure has increased in the interim. Electronically Signed: By: Frazier Richards M.D. On: 03/19/2022 08:04   DG CHEST PORT 1 VIEW  Result Date: 03/18/2022 CLINICAL DATA:  Chest tube in place.  EXAM: PORTABLE CHEST 1 VIEW COMPARISON:  Chest radiograph earlier today, CT 03/13/2022 FINDINGS: Upper extremity PICC tip in the right atrium. Right basilar pigtail catheter. This is unchanged in position. There is a new pigtail catheter projecting over the lower left lung base. Persistent hazy bilateral lung opacities. Subtotal opacification of the 1/2 of left hemithorax, may represent loculated pleural effusion or lobar collapse. Rounded density in the right mid lung is favored to represent fluid in the fissure. Radiopaque densities projecting over the retroperitoneum and mediastinum likely related to recent thoracic duct embolization. No visible pneumothorax IMPRESSION: 1. New pigtail catheter projecting over the lower left lung base. Stable positioning of right pigtail catheter. 2. Progressive opacification of the upper left hemithorax which may be due to loculated pleural effusion or lobar collapse/consolidation. Rounded density in the right mid lung favors to be fluid in the fissure. Persistent hazy basilar opacities likely subpulmonic effusions. 3. No pneumothorax. Electronically Signed   By: Keith Rake M.D.   On: 03/18/2022 19:20   DG CHEST PORT 1 VIEW  Result Date: 03/18/2022 CLINICAL DATA:  Status post removal of left chest tube. EXAM: PORTABLE CHEST 1 VIEW COMPARISON:  03/16/2022 FINDINGS: There is a left arm PICC line with tip in the right atrium. Stable cardiomediastinal contours. Interval removal of left chest tube. No pneumothorax identified. Right basilar chest tube is again noted. Interval increase opacification overlying both lungs is identified compared with 03/16/2022 which may reflect progressive bilateral pleural effusions, airspace disease and or atelectasis. IMPRESSION: 1. No pneumothorax status post left chest tube removal. 2. Increase opacification of both lungs compared with 03/16/2022 which may reflect progressive bilateral pleural effusions, airspace disease and/or atelectasis.  Electronically Signed   By: Kerby Moors M.D.   On: 03/18/2022 05:15   IR EMBO VENOUS NOT HEMORR HEMANG  INC GUIDE ROADMAPPING  Result Date: 03/20/2022 INDICATION: 61 year old female with chronic lymphocytic leukemia and large volume bilateral chylous pleural effusions. She presents for lymphangiogram and thoracic duct embolization. EXAM: IR EMBO VENOUS NOT HEMORR HEMANG INC GUIDE ROADMAPPING; PICC REPLACEMENT UNDER ULTRASOUND; IR RIGHT FLUORO GUIDE CV LINE; IR ULTRASOUND GUIDANCE VASC ACCESS LEFT; LYMPHANGIOGRAM; IR ULTRASOUND GUIDANCE VASC ACCESS RIGHT 1. Ultrasound-guided placement of central venous catheter via right common femoral vein 2. Ultrasound-guided puncture right superficial inguinal lymph node 3. Ultrasound-guided puncture left superficial inguinal lymph node 4. Bilateral lymphangiogram 5. Balloon occluded embolization of thoracic duct 6. Left upper extremity PICC exchange MEDICATIONS: 2 g cefoxitin. The antibiotic was administered within 1 hour of the procedure ANESTHESIA/SEDATION: General endotracheal anesthesia provided by the anesthesiology service. CONTRAST:  32m OMNIPAQUE IOHEXOL 300 MG/ML  SOLN FLUOROSCOPY: Radiation Exposure Index (as provided by the fluoroscopic device): 4791mGy Kerma COMPLICATIONS: None immediate. PROCEDURE: Informed consent was obtained from the patient following explanation of the procedure, risks, benefits and alternatives. The patient understands, agrees and consents for the procedure. All questions were addressed. A time out was performed prior to the initiation of the procedure.  Maximal barrier sterile technique utilized including caps, mask, sterile gowns, sterile gloves, large sterile drape, hand hygiene, and Betadine prep. The right common femoral vein was interrogated with ultrasound and found to be widely patent. An image was obtained and stored for the medical record. Local anesthesia was attained by infiltration with 1% lidocaine. A small dermatotomy was  made. Under real-time sonographic guidance, the vessel was punctured with a 21 gauge micropuncture needle. Using standard technique, the initial micro needle was exchanged over a 0.018 micro wire for a transitional 4 [place] micro sheath. The micro sheath was then exchanged over a 0.035 wire for a fascial dilator and the soft tissue tract was dilated. An aero triple-lumen central venous catheter was then advanced over the wire and position with the tip in the right common iliac vein. The catheter flushed and aspirated easily. The catheter was capped and secured in place. Ultrasound was used to interrogate the right groin. A large pathologic lymph node with what appears to be a visible lymphatic channel was identified. Under real-time ultrasound guidance, a 25 gauge spinal needle was advanced into the vascular channel. Gentle did injection of contrast under fluoroscopy demonstrates a blob like architecture. This is not consistent with lymphatic flow. Therefore, a smaller and more normal appearing lymph nodes slightly more inferior was identified. Using the same technique, the hilum of the lymph node was punctured with a 25 gauge spinal needle. A gentle injection of Lipiodol under fluoroscopy demonstrates successful filling of lymphatic channels. The needle was secured in place with Tegaderm. Attention was now turned to the contralateral groin. Using the same technique, a lymph node on the left was successfully accessed with ultrasound guidance. Lipiodol injection was performed under fluoroscopy. There was some extension of injected Lipiodol into the central venous system. Therefore, this lymph node was abandoned. A second lymph node was targeted and punctured. This time upon the hiatal injection there is normal opacification of the lymphatic system. Lipiodol was then slowly injected through both accessed superficial inguinal lymph nodes and a diagnostic lymphangiogram was performed. Numerous enlarged pathologic lymph  nodes are identified bilaterally along the pelvic side chains and throughout the retroperitoneum. Ultimately, there was coalescence of the lymphatic system into the thoracic duct at the level of L2-L3. Once the thoracic duct was well visualized, angulation was performed to separate the atherosclerotic wall of the aorta from the thoracic duct. Attempts were then made to puncture the duct using a combination of different needles. After several attempts, catheterization was not successful. Therefore, the decision was made to attempt access of the thoracic duct at its insertion into the left subclavian vein. The patient's existing PICC line was cut and removed over a wire. A 5 French sheath was advanced into the left upper arm vein. Attempts were then made to catheterize the insertion of the thoracic duct using a combination of angiographic catheters and micro catheters. After a prolonged period, further attempts at this approach were abandoned. Attention was again turned to the thoracic duct which had re-opacified with Lipiodol. A 21 gauge trocar style needle was employed and used to successfully puncture the duct. A transcend wire was advanced up the duct. An initial attempt to advanced the Progreat catheter into the thoracic duct was not successful. Therefore, a 0.014 CXI catheter was selected and successfully advanced over the wire and into the thoracic duct. The catheter and wire combination were advanced as far into the duct is possible. Ultimately, a Glidewire Advantage wire was parked in the duct and  the CXI catheter exchange for a 2.4 [place] Progreat catheter. The Progreat catheter was then navigated more distally into the duct to the level of the lower thoracic spine. Additional lymphadenopathy utilizing direct contrast injection demonstrates a region where the main cystic duct is obliterated by pathologic lymphadenopathy. The branches ram if I into dozens of small thread like branches which then reconstitutes  the main thoracic duct. Efforts were made to find a passage through this Maze of small vessels, but this was not successful. At this point, the decision was made to proceed with embolization from the given location. As we had no backstop off coils to prevent embolization through the thoracic duct into the subclavian vein, we decided to place an occlusion balloon in the left subclavian vein. A Bentson wire was advanced through the left arm access into the right heart. A 12 x 40 mm mustang balloon was advanced over the wire and positioned across the origin of the thoracic duct. The balloon was inflated to full effacement. True fill liquid embolic glue was then reconstituted in Lipiodol at a concentration of 9:1. The glue mixture was then carefully injected through the Progreat catheter in till there was successful antegrade filling of nearly the entirety of the thoracic duct. The glue was taken up to the level of the aortic arch almost to the inferior aspect of the clavicle. No further injection was performed to prevent reflux into the subclavian vein. The microcatheter was then pulled back and the remaining thoracic duct embolized with glue. The catheter was then withdrawn. After several minutes the subclavian venous balloon was deflated and removed. A new dual lumen power injectable PICC was cut to 43 cm and advanced over the wire and position with the tip at the cavoatrial junction. The catheter flushes and aspirates easily. The catheter was capped and secured to the skin with an adhesive fixation device. IMPRESSION: 1. Successful placement of a right common femoral central venous catheter using ultrasound and fluoroscopic guidance. 2. Successful ultrasound-guided puncture of bilateral superficial inguinal lymph nodes with bilateral abdominopelvic lymphangiogram. 3. Successful liquid embolic embolization of the thoracic duct using true fill glue. 4. Exchange of left upper extremity PICC. Electronically Signed   By:  Jacqulynn Cadet M.D.   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 She presents for lymphangiogram and thoracic duct embolization. EXAM: IR EMBO VENOUS NOT HEMORR HEMANG INC GUIDE ROADMAPPING; PICC REPLACEMENT UNDER ULTRASOUND; IR RIGHT FLUORO GUIDE CV LINE; IR ULTRASOUND GUIDANCE VASC ACCESS LEFT; LYMPHANGIOGRAM; IR ULTRASOUND GUIDANCE VASC ACCESS RIGHT 1. Ultrasound-guided placement of central venous catheter via right common femoral vein 2. Ultrasound-guided puncture right superficial inguinal lymph node 3. Ultrasound-guided puncture left superficial inguinal lymph node 4. Bilateral lymphangiogram 5. Balloon occluded embolization of thoracic duct 6. Left upper extremity PICC exchange MEDICATIONS: 2 g cefoxitin. The antibiotic was administered within 1 hour of the procedure ANESTHESIA/SEDATION: General endotracheal anesthesia provided by the anesthesiology service. CONTRAST:  73m OMNIPAQUE IOHEXOL 300 MG/ML  SOLN FLUOROSCOPY: Radiation Exposure Index (as provided by the fluoroscopic device): 4109mGy Kerma COMPLICATIONS: None immediate. PROCEDURE: Informed consent was obtained from the patient following explanation of the procedure, risks, benefits and alternatives. The patient understands, agrees and consents for the procedure. All questions were addressed. A time out was performed prior to the initiation of the procedure. Maximal barrier sterile technique utilized including caps, mask, sterile gowns, sterile  gloves, large sterile drape, hand hygiene, and Betadine prep. The right common femoral vein was interrogated with ultrasound and found to be widely patent. An image was obtained and stored for the medical record. Local anesthesia was attained by infiltration with 1% lidocaine. A small dermatotomy was made. Under real-time sonographic guidance, the  vessel was punctured with a 21 gauge micropuncture needle. Using standard technique, the initial micro needle was exchanged over a 0.018 micro wire for a transitional 4 [place] micro sheath. The micro sheath was then exchanged over a 0.035 wire for a fascial dilator and the soft tissue tract was dilated. An aero triple-lumen central venous catheter was then advanced over the wire and position with the tip in the right common iliac vein. The catheter flushed and aspirated easily. The catheter was capped and secured in place. Ultrasound was used to interrogate the right groin. A large pathologic lymph node with what appears to be a visible lymphatic channel was identified. Under real-time ultrasound guidance, a 25 gauge spinal needle was advanced into the vascular channel. Gentle did injection of contrast under fluoroscopy demonstrates a blob like architecture. This is not consistent with lymphatic flow. Therefore, a smaller and more normal appearing lymph nodes slightly more inferior was identified. Using the same technique, the hilum of the lymph node was punctured with a 25 gauge spinal needle. A gentle injection of Lipiodol under fluoroscopy demonstrates successful filling of lymphatic channels. The needle was secured in place with Tegaderm. Attention was now turned to the contralateral groin. Using the same technique, a lymph node on the left was successfully accessed with ultrasound guidance. Lipiodol injection was performed under fluoroscopy. There was some extension of injected Lipiodol into the central venous system. Therefore, this lymph node was abandoned. A second lymph node was targeted and punctured. This time upon the hiatal injection there is normal opacification of the lymphatic system. Lipiodol was then slowly injected through both accessed superficial inguinal lymph nodes and a diagnostic lymphangiogram was performed. Numerous enlarged pathologic lymph nodes are identified bilaterally along the  pelvic side chains and throughout the retroperitoneum. Ultimately, there was coalescence of the lymphatic system into the thoracic duct at the level of L2-L3. Once the thoracic duct was well visualized, angulation was performed to separate the atherosclerotic wall of the aorta from the thoracic duct. Attempts were then made to puncture the duct using a combination of different needles. After several attempts, catheterization was not successful. Therefore, the decision was made to attempt access of the thoracic duct at its insertion into the left subclavian vein. The patient's existing PICC line was cut and removed over a wire. A 5 French sheath was advanced into the left upper arm vein. Attempts were then made to catheterize the insertion of the thoracic duct using a combination of angiographic catheters and micro catheters. After a prolonged period, further attempts at this approach were abandoned. Attention was again turned to the thoracic duct which had re-opacified with Lipiodol. A 21 gauge trocar style needle was employed and used to successfully puncture the duct. A transcend wire was advanced up the duct. An initial attempt to advanced the Progreat catheter into the thoracic duct was not successful. Therefore, a 0.014 CXI catheter was selected and successfully advanced over the wire and into the thoracic duct. The catheter and wire combination were advanced as far into the duct is possible. Ultimately, a Glidewire Advantage wire was parked in the duct and the CXI catheter exchange for a 2.4 [place] Progreat catheter. The  Progreat catheter was then navigated more distally into the duct to the level of the lower thoracic spine. Additional lymphadenopathy utilizing direct contrast injection demonstrates a region where the main cystic duct is obliterated by pathologic lymphadenopathy. The branches ram if I into dozens of small thread like branches which then reconstitutes the main thoracic duct. Efforts were made  to find a passage through this Maze of small vessels, but this was not successful. At this point, the decision was made to proceed with embolization from the given location. As we had no backstop off coils to prevent embolization through the thoracic duct into the subclavian vein, we decided to place an occlusion balloon in the left subclavian vein. A Bentson wire was advanced through the left arm access into the right heart. A 12 x 40 mm mustang balloon was advanced over the wire and positioned across the origin of the thoracic duct. The balloon was inflated to full effacement. True fill liquid embolic glue was then reconstituted in Lipiodol at a concentration of 9:1. The glue mixture was then carefully injected through the Progreat catheter in till there was successful antegrade filling of nearly the entirety of the thoracic duct. The glue was taken up to the level of the aortic arch almost to the inferior aspect of the clavicle. No further injection was performed to prevent reflux into the subclavian vein. The microcatheter was then pulled back and the remaining thoracic duct embolized with glue. The catheter was then withdrawn. After several minutes the subclavian venous balloon was deflated and removed. A new dual lumen power injectable PICC was cut to 43 cm and advanced over the wire and position with the tip at the cavoatrial junction. The catheter flushes and aspirates easily. The catheter was capped and secured to the skin with an adhesive fixation device. IMPRESSION: 1. Successful placement of a right common femoral central venous catheter using ultrasound and fluoroscopic guidance. 2. Successful ultrasound-guided puncture of bilateral superficial inguinal lymph nodes with bilateral abdominopelvic lymphangiogram. 3. Successful liquid embolic embolization of the thoracic duct using true fill glue. 4. Exchange of left upper extremity PICC. Electronically Signed   By: Jacqulynn Cadet M.D.   On: 03/16/2022  15:47   IR US Guide Vasc Access Left  Result Date: 04/09/2022 INDICATION: 61 year old female with chronic lymphocytic leukemia and large volume bilateral chylous pleural effusions. She presents for lymphangiogram and thoracic duct embolization. EXAM: IR EMBO VENOUS NOT HEMORR HEMANG INC GUIDE ROADMAPPING; PICC REPLACEMENT UNDER ULTRASOUND; IR RIGHT FLUORO GUIDE CV LINE; IR ULTRASOUND GUIDANCE VASC ACCESS LEFT; LYMPHANGIOGRAM; IR ULTRASOUND GUIDANCE VASC ACCESS RIGHT 1. Ultrasound-guided placement of central venous catheter via right common femoral vein 2. Ultrasound-guided puncture right superficial inguinal lymph node 3. Ultrasound-guided puncture left superficial inguinal lymph node 4. Bilateral lymphangiogram 5. Balloon occluded embolization of thoracic duct 6. Left upper extremity PICC exchange MEDICATIONS: 2 g cefoxitin. The antibiotic was administered within 1 hour of the procedure ANESTHESIA/SEDATION: General endotracheal anesthesia provided by the anesthesiology service. CONTRAST:  84m OMNIPAQUE IOHEXOL 300 MG/ML  SOLN FLUOROSCOPY: Radiation Exposure Index (as provided by the fluoroscopic device): 4229mGy Kerma COMPLICATIONS: None immediate. PROCEDURE: Informed consent was obtained from the patient following explanation of the procedure, risks, benefits and alternatives. The patient understands, agrees and consents for the procedure. All questions were addressed. A time out was performed prior to the initiation of the procedure. Maximal barrier sterile technique utilized including caps, mask, sterile gowns, sterile gloves, large sterile drape, hand hygiene, and Betadine prep. The right  common femoral vein was interrogated with ultrasound and found to be widely patent. An image was obtained and stored for the medical record. Local anesthesia was attained by infiltration with 1% lidocaine. A small dermatotomy was made. Under real-time sonographic guidance, the vessel was punctured with a 21 gauge  micropuncture needle. Using standard technique, the initial micro needle was exchanged over a 0.018 micro wire for a transitional 4 [place] micro sheath. The micro sheath was then exchanged over a 0.035 wire for a fascial dilator and the soft tissue tract was dilated. An aero triple-lumen central venous catheter was then advanced over the wire and position with the tip in the right common iliac vein. The catheter flushed and aspirated easily. The catheter was capped and secured in place. Ultrasound was used to interrogate the right groin. A large pathologic lymph node with what appears to be a visible lymphatic channel was identified. Under real-time ultrasound guidance, a 25 gauge spinal needle was advanced into the vascular channel. Gentle did injection of contrast under fluoroscopy demonstrates a blob like architecture. This is not consistent with lymphatic flow. Therefore, a smaller and more normal appearing lymph nodes slightly more inferior was identified. Using the same technique, the hilum of the lymph node was punctured with a 25 gauge spinal needle. A gentle injection of Lipiodol under fluoroscopy demonstrates successful filling of lymphatic channels. The needle was secured in place with Tegaderm. Attention was now turned to the contralateral groin. Using the same technique, a lymph node on the left was successfully accessed with ultrasound guidance. Lipiodol injection was performed under fluoroscopy. There was some extension of injected Lipiodol into the central venous system. Therefore, this lymph node was abandoned. A second lymph node was targeted and punctured. This time upon the hiatal injection there is normal opacification of the lymphatic system. Lipiodol was then slowly injected through both accessed superficial inguinal lymph nodes and a diagnostic lymphangiogram was performed. Numerous enlarged pathologic lymph nodes are identified bilaterally along the pelvic side chains and throughout the  retroperitoneum. Ultimately, there was coalescence of the lymphatic system into the thoracic duct at the level of L2-L3. Once the thoracic duct was well visualized, angulation was performed to separate the atherosclerotic wall of the aorta from the thoracic duct. Attempts were then made to puncture the duct using a combination of different needles. After several attempts, catheterization was not successful. Therefore, the decision was made to attempt access of the thoracic duct at its insertion into the left subclavian vein. The patient's existing PICC line was cut and removed over a wire. A 5 French sheath was advanced into the left upper arm vein. Attempts were then made to catheterize the insertion of the thoracic duct using a combination of angiographic catheters and micro catheters. After a prolonged period, further attempts at this approach were abandoned. Attention was again turned to the thoracic duct which had re-opacified with Lipiodol. A 21 gauge trocar style needle was employed and used to successfully puncture the duct. A transcend wire was advanced up the duct. An initial attempt to advanced the Progreat catheter into the thoracic duct was not successful. Therefore, a 0.014 CXI catheter was selected and successfully advanced over the wire and into the thoracic duct. The catheter and wire combination were advanced as far into the duct is possible. Ultimately, a Glidewire Advantage wire was parked in the duct and the CXI catheter exchange for a 2.4 [place] Progreat catheter. The Progreat catheter was then navigated more distally into the duct to  the level of the lower thoracic spine. Additional lymphadenopathy utilizing direct contrast injection demonstrates a region where the main cystic duct is obliterated by pathologic lymphadenopathy. The branches ram if I into dozens of small thread like branches which then reconstitutes the main thoracic duct. Efforts were made to find a passage through this Maze of  small vessels, but this was not successful. At this point, the decision was made to proceed with embolization from the given location. As we had no backstop off coils to prevent embolization through the thoracic duct into the subclavian vein, we decided to place an occlusion balloon in the left subclavian vein. A Bentson wire was advanced through the left arm access into the right heart. A 12 x 40 mm mustang balloon was advanced over the wire and positioned across the origin of the thoracic duct. The balloon was inflated to full effacement. True fill liquid embolic glue was then reconstituted in Lipiodol at a concentration of 9:1. The glue mixture was then carefully injected through the Progreat catheter in till there was successful antegrade filling of nearly the entirety of the thoracic duct. The glue was taken up to the level of the aortic arch almost to the inferior aspect of the clavicle. No further injection was performed to prevent reflux into the subclavian vein. The microcatheter was then pulled back and the remaining thoracic duct embolized with glue. The catheter was then withdrawn. After several minutes the subclavian venous balloon was deflated and removed. A new dual lumen power injectable PICC was cut to 43 cm and advanced over the wire and position with the tip at the cavoatrial junction. The catheter flushes and aspirates easily. The catheter was capped and secured to the skin with an adhesive fixation device. IMPRESSION: 1. Successful placement of a right common femoral central venous catheter using ultrasound and fluoroscopic guidance. 2. Successful ultrasound-guided puncture of bilateral superficial inguinal lymph nodes with bilateral abdominopelvic lymphangiogram. 3. Successful liquid embolic embolization of the thoracic duct using true fill glue. 4. Exchange of left upper extremity PICC. Electronically Signed   By: Jacqulynn Cadet M.D.   On: 04/08/2022 15:47   IR LYMPHANGIOGRAM PEL/ABD  BILAT  Result Date: 04/12/2022 INDICATION: 61 year old female with chronic lymphocytic leukemia and large volume bilateral chylous pleural effusions. She presents for lymphangiogram and thoracic duct embolization. EXAM: IR EMBO VENOUS NOT HEMORR HEMANG INC GUIDE ROADMAPPING; PICC REPLACEMENT UNDER ULTRASOUND; IR RIGHT FLUORO GUIDE CV LINE; IR ULTRASOUND GUIDANCE VASC ACCESS LEFT; LYMPHANGIOGRAM; IR ULTRASOUND GUIDANCE VASC ACCESS RIGHT 1. Ultrasound-guided placement of central venous catheter via right common femoral vein 2. Ultrasound-guided puncture right superficial inguinal lymph node 3. Ultrasound-guided puncture left superficial inguinal lymph node 4. Bilateral lymphangiogram 5. Balloon occluded embolization of thoracic duct 6. Left upper extremity PICC exchange MEDICATIONS: 2 g cefoxitin. The antibiotic was administered within 1 hour of the procedure ANESTHESIA/SEDATION: General endotracheal anesthesia provided by the anesthesiology service. CONTRAST:  16m OMNIPAQUE IOHEXOL 300 MG/ML  SOLN FLUOROSCOPY: Radiation Exposure Index (as provided by the fluoroscopic device): 4250mGy Kerma COMPLICATIONS: None immediate. PROCEDURE: Informed consent was obtained from the patient following explanation of the procedure, risks, benefits and alternatives. The patient understands, agrees and consents for the procedure. All questions were addressed. A time out was performed prior to the initiation of the procedure. Maximal barrier sterile technique utilized including caps, mask, sterile gowns, sterile gloves, large sterile drape, hand hygiene, and Betadine prep. The right common femoral vein was interrogated with ultrasound and found to be widely patent.  An image was obtained and stored for the medical record. Local anesthesia was attained by infiltration with 1% lidocaine. A small dermatotomy was made. Under real-time sonographic guidance, the vessel was punctured with a 21 gauge micropuncture needle. Using standard  technique, the initial micro needle was exchanged over a 0.018 micro wire for a transitional 4 [place] micro sheath. The micro sheath was then exchanged over a 0.035 wire for a fascial dilator and the soft tissue tract was dilated. An aero triple-lumen central venous catheter was then advanced over the wire and position with the tip in the right common iliac vein. The catheter flushed and aspirated easily. The catheter was capped and secured in place. Ultrasound was used to interrogate the right groin. A large pathologic lymph node with what appears to be a visible lymphatic channel was identified. Under real-time ultrasound guidance, a 25 gauge spinal needle was advanced into the vascular channel. Gentle did injection of contrast under fluoroscopy demonstrates a blob like architecture. This is not consistent with lymphatic flow. Therefore, a smaller and more normal appearing lymph nodes slightly more inferior was identified. Using the same technique, the hilum of the lymph node was punctured with a 25 gauge spinal needle. A gentle injection of Lipiodol under fluoroscopy demonstrates successful filling of lymphatic channels. The needle was secured in place with Tegaderm. Attention was now turned to the contralateral groin. Using the same technique, a lymph node on the left was successfully accessed with ultrasound guidance. Lipiodol injection was performed under fluoroscopy. There was some extension of injected Lipiodol into the central venous system. Therefore, this lymph node was abandoned. A second lymph node was targeted and punctured. This time upon the hiatal injection there is normal opacification of the lymphatic system. Lipiodol was then slowly injected through both accessed superficial inguinal lymph nodes and a diagnostic lymphangiogram was performed. Numerous enlarged pathologic lymph nodes are identified bilaterally along the pelvic side chains and throughout the retroperitoneum. Ultimately, there was  coalescence of the lymphatic system into the thoracic duct at the level of L2-L3. Once the thoracic duct was well visualized, angulation was performed to separate the atherosclerotic wall of the aorta from the thoracic duct. Attempts were then made to puncture the duct using a combination of different needles. After several attempts, catheterization was not successful. Therefore, the decision was made to attempt access of the thoracic duct at its insertion into the left subclavian vein. The patient's existing PICC line was cut and removed over a wire. A 5 French sheath was advanced into the left upper arm vein. Attempts were then made to catheterize the insertion of the thoracic duct using a combination of angiographic catheters and micro catheters. After a prolonged period, further attempts at this approach were abandoned. Attention was again turned to the thoracic duct which had re-opacified with Lipiodol. A 21 gauge trocar style needle was employed and used to successfully puncture the duct. A transcend wire was advanced up the duct. An initial attempt to advanced the Progreat catheter into the thoracic duct was not successful. Therefore, a 0.014 CXI catheter was selected and successfully advanced over the wire and into the thoracic duct. The catheter and wire combination were advanced as far into the duct is possible. Ultimately, a Glidewire Advantage wire was parked in the duct and the CXI catheter exchange for a 2.4 [place] Progreat catheter. The Progreat catheter was then navigated more distally into the duct to the level of the lower thoracic spine. Additional lymphadenopathy utilizing direct contrast injection  demonstrates a region where the main cystic duct is obliterated by pathologic lymphadenopathy. The branches ram if I into dozens of small thread like branches which then reconstitutes the main thoracic duct. Efforts were made to find a passage through this Maze of small vessels, but this was not  successful. At this point, the decision was made to proceed with embolization from the given location. As we had no backstop off coils to prevent embolization through the thoracic duct into the subclavian vein, we decided to place an occlusion balloon in the left subclavian vein. A Bentson wire was advanced through the left arm access into the right heart. A 12 x 40 mm mustang balloon was advanced over the wire and positioned across the origin of the thoracic duct. The balloon was inflated to full effacement. True fill liquid embolic glue was then reconstituted in Lipiodol at a concentration of 9:1. The glue mixture was then carefully injected through the Progreat catheter in till there was successful antegrade filling of nearly the entirety of the thoracic duct. The glue was taken up to the level of the aortic arch almost to the inferior aspect of the clavicle. No further injection was performed to prevent reflux into the subclavian vein. The microcatheter was then pulled back and the remaining thoracic duct embolized with glue. The catheter was then withdrawn. After several minutes the subclavian venous balloon was deflated and removed. A new dual lumen power injectable PICC was cut to 43 cm and advanced over the wire and position with the tip at the cavoatrial junction. The catheter flushes and aspirates easily. The catheter was capped and secured to the skin with an adhesive fixation device. IMPRESSION: 1. Successful placement of a right common femoral central venous catheter using ultrasound and fluoroscopic guidance. 2. Successful ultrasound-guided puncture of bilateral superficial inguinal lymph nodes with bilateral abdominopelvic lymphangiogram. 3. Successful liquid embolic embolization of the thoracic duct using true fill glue. 4. Exchange of left upper extremity PICC. Electronically Signed   By: Jacqulynn Cadet M.D.   On: 03/21/2022 15:47   IR PICC REPLACEMENT LEFT INC IMG GUIDE  Result Date:  03/16/2022 INDICATION: 62 year old female with chronic lymphocytic leukemia and large volume bilateral chylous pleural effusions. She presents for lymphangiogram and thoracic duct embolization. EXAM: IR EMBO VENOUS NOT HEMORR HEMANG INC GUIDE ROADMAPPING; PICC REPLACEMENT UNDER ULTRASOUND; IR RIGHT FLUORO GUIDE CV LINE; IR ULTRASOUND GUIDANCE VASC ACCESS LEFT; LYMPHANGIOGRAM; IR ULTRASOUND GUIDANCE VASC ACCESS RIGHT 1. Ultrasound-guided placement of central venous catheter via right common femoral vein 2. Ultrasound-guided puncture right superficial inguinal lymph node 3. Ultrasound-guided puncture left superficial inguinal lymph node 4. Bilateral lymphangiogram 5. Balloon occluded embolization of thoracic duct 6. Left upper extremity PICC exchange MEDICATIONS: 2 g cefoxitin. The antibiotic was administered within 1 hour of the procedure ANESTHESIA/SEDATION: General endotracheal anesthesia provided by the anesthesiology service. CONTRAST:  89m OMNIPAQUE IOHEXOL 300 MG/ML  SOLN FLUOROSCOPY: Radiation Exposure Index (as provided by the fluoroscopic device): 4427mGy Kerma COMPLICATIONS: None immediate. PROCEDURE: Informed consent was obtained from the patient following explanation of the procedure, risks, benefits and alternatives. The patient understands, agrees and consents for the procedure. All questions were addressed. A time out was performed prior to the initiation of the procedure. Maximal barrier sterile technique utilized including caps, mask, sterile gowns, sterile gloves, large sterile drape, hand hygiene, and Betadine prep. The right common femoral vein was interrogated with ultrasound and found to be widely patent. An image was obtained and stored for the medical record.  Local anesthesia was attained by infiltration with 1% lidocaine. A small dermatotomy was made. Under real-time sonographic guidance, the vessel was punctured with a 21 gauge micropuncture needle. Using standard technique, the initial  micro needle was exchanged over a 0.018 micro wire for a transitional 4 [place] micro sheath. The micro sheath was then exchanged over a 0.035 wire for a fascial dilator and the soft tissue tract was dilated. An aero triple-lumen central venous catheter was then advanced over the wire and position with the tip in the right common iliac vein. The catheter flushed and aspirated easily. The catheter was capped and secured in place. Ultrasound was used to interrogate the right groin. A large pathologic lymph node with what appears to be a visible lymphatic channel was identified. Under real-time ultrasound guidance, a 25 gauge spinal needle was advanced into the vascular channel. Gentle did injection of contrast under fluoroscopy demonstrates a blob like architecture. This is not consistent with lymphatic flow. Therefore, a smaller and more normal appearing lymph nodes slightly more inferior was identified. Using the same technique, the hilum of the lymph node was punctured with a 25 gauge spinal needle. A gentle injection of Lipiodol under fluoroscopy demonstrates successful filling of lymphatic channels. The needle was secured in place with Tegaderm. Attention was now turned to the contralateral groin. Using the same technique, a lymph node on the left was successfully accessed with ultrasound guidance. Lipiodol injection was performed under fluoroscopy. There was some extension of injected Lipiodol into the central venous system. Therefore, this lymph node was abandoned. A second lymph node was targeted and punctured. This time upon the hiatal injection there is normal opacification of the lymphatic system. Lipiodol was then slowly injected through both accessed superficial inguinal lymph nodes and a diagnostic lymphangiogram was performed. Numerous enlarged pathologic lymph nodes are identified bilaterally along the pelvic side chains and throughout the retroperitoneum. Ultimately, there was coalescence of the  lymphatic system into the thoracic duct at the level of L2-L3. Once the thoracic duct was well visualized, angulation was performed to separate the atherosclerotic wall of the aorta from the thoracic duct. Attempts were then made to puncture the duct using a combination of different needles. After several attempts, catheterization was not successful. Therefore, the decision was made to attempt access of the thoracic duct at its insertion into the left subclavian vein. The patient's existing PICC line was cut and removed over a wire. A 5 French sheath was advanced into the left upper arm vein. Attempts were then made to catheterize the insertion of the thoracic duct using a combination of angiographic catheters and micro catheters. After a prolonged period, further attempts at this approach were abandoned. Attention was again turned to the thoracic duct which had re-opacified with Lipiodol. A 21 gauge trocar style needle was employed and used to successfully puncture the duct. A transcend wire was advanced up the duct. An initial attempt to advanced the Progreat catheter into the thoracic duct was not successful. Therefore, a 0.014 CXI catheter was selected and successfully advanced over the wire and into the thoracic duct. The catheter and wire combination were advanced as far into the duct is possible. Ultimately, a Glidewire Advantage wire was parked in the duct and the CXI catheter exchange for a 2.4 [place] Progreat catheter. The Progreat catheter was then navigated more distally into the duct to the level of the lower thoracic spine. Additional lymphadenopathy utilizing direct contrast injection demonstrates a region where the main cystic duct is obliterated  by pathologic lymphadenopathy. The branches ram if I into dozens of small thread like branches which then reconstitutes the main thoracic duct. Efforts were made to find a passage through this Maze of small vessels, but this was not successful. At this  point, the decision was made to proceed with embolization from the given location. As we had no backstop off coils to prevent embolization through the thoracic duct into the subclavian vein, we decided to place an occlusion balloon in the left subclavian vein. A Bentson wire was advanced through the left arm access into the right heart. A 12 x 40 mm mustang balloon was advanced over the wire and positioned across the origin of the thoracic duct. The balloon was inflated to full effacement. True fill liquid embolic glue was then reconstituted in Lipiodol at a concentration of 9:1. The glue mixture was then carefully injected through the Progreat catheter in till there was successful antegrade filling of nearly the entirety of the thoracic duct. The glue was taken up to the level of the aortic arch almost to the inferior aspect of the clavicle. No further injection was performed to prevent reflux into the subclavian vein. The microcatheter was then pulled back and the remaining thoracic duct embolized with glue. The catheter was then withdrawn. After several minutes the subclavian venous balloon was deflated and removed. A new dual lumen power injectable PICC was cut to 43 cm and advanced over the wire and position with the tip at the cavoatrial junction. The catheter flushes and aspirates easily. The catheter was capped and secured to the skin with an adhesive fixation device. IMPRESSION: 1. Successful placement of a right common femoral central venous catheter using ultrasound and fluoroscopic guidance. 2. Successful ultrasound-guided puncture of bilateral superficial inguinal lymph nodes with bilateral abdominopelvic lymphangiogram. 3. Successful liquid embolic embolization of the thoracic duct using true fill glue. 4. Exchange of left upper extremity PICC. Electronically Signed   By: Jacqulynn Cadet M.D.   On: 04/05/2022 15:47   IR Fluoro Guide CV Line Right  Result Date: 03/19/2022 INDICATION: 61 year old  female with chronic lymphocytic leukemia and large volume bilateral chylous pleural effusions. She presents for lymphangiogram and thoracic duct embolization. EXAM: IR EMBO VENOUS NOT HEMORR HEMANG INC GUIDE ROADMAPPING; PICC REPLACEMENT UNDER ULTRASOUND; IR RIGHT FLUORO GUIDE CV LINE; IR ULTRASOUND GUIDANCE VASC ACCESS LEFT; LYMPHANGIOGRAM; IR ULTRASOUND GUIDANCE VASC ACCESS RIGHT 1. Ultrasound-guided placement of central venous catheter via right common femoral vein 2. Ultrasound-guided puncture right superficial inguinal lymph node 3. Ultrasound-guided puncture left superficial inguinal lymph node 4. Bilateral lymphangiogram 5. Balloon occluded embolization of thoracic duct 6. Left upper extremity PICC exchange MEDICATIONS: 2 g cefoxitin. The antibiotic was administered within 1 hour of the procedure ANESTHESIA/SEDATION: General endotracheal anesthesia provided by the anesthesiology service. CONTRAST:  57m OMNIPAQUE IOHEXOL 300 MG/ML  SOLN FLUOROSCOPY: Radiation Exposure Index (as provided by the fluoroscopic device): 4591mGy Kerma COMPLICATIONS: None immediate. PROCEDURE: Informed consent was obtained from the patient following explanation of the procedure, risks, benefits and alternatives. The patient understands, agrees and consents for the procedure. All questions were addressed. A time out was performed prior to the initiation of the procedure. Maximal barrier sterile technique utilized including caps, mask, sterile gowns, sterile gloves, large sterile drape, hand hygiene, and Betadine prep. The right common femoral vein was interrogated with ultrasound and found to be widely patent. An image was obtained and stored for the medical record. Local anesthesia was attained by infiltration with 1% lidocaine. A small  dermatotomy was made. Under real-time sonographic guidance, the vessel was punctured with a 21 gauge micropuncture needle. Using standard technique, the initial micro needle was exchanged over a  0.018 micro wire for a transitional 4 [place] micro sheath. The micro sheath was then exchanged over a 0.035 wire for a fascial dilator and the soft tissue tract was dilated. An aero triple-lumen central venous catheter was then advanced over the wire and position with the tip in the right common iliac vein. The catheter flushed and aspirated easily. The catheter was capped and secured in place. Ultrasound was used to interrogate the right groin. A large pathologic lymph node with what appears to be a visible lymphatic channel was identified. Under real-time ultrasound guidance, a 25 gauge spinal needle was advanced into the vascular channel. Gentle did injection of contrast under fluoroscopy demonstrates a blob like architecture. This is not consistent with lymphatic flow. Therefore, a smaller and more normal appearing lymph nodes slightly more inferior was identified. Using the same technique, the hilum of the lymph node was punctured with a 25 gauge spinal needle. A gentle injection of Lipiodol under fluoroscopy demonstrates successful filling of lymphatic channels. The needle was secured in place with Tegaderm. Attention was now turned to the contralateral groin. Using the same technique, a lymph node on the left was successfully accessed with ultrasound guidance. Lipiodol injection was performed under fluoroscopy. There was some extension of injected Lipiodol into the central venous system. Therefore, this lymph node was abandoned. A second lymph node was targeted and punctured. This time upon the hiatal injection there is normal opacification of the lymphatic system. Lipiodol was then slowly injected through both accessed superficial inguinal lymph nodes and a diagnostic lymphangiogram was performed. Numerous enlarged pathologic lymph nodes are identified bilaterally along the pelvic side chains and throughout the retroperitoneum. Ultimately, there was coalescence of the lymphatic system into the thoracic duct  at the level of L2-L3. Once the thoracic duct was well visualized, angulation was performed to separate the atherosclerotic wall of the aorta from the thoracic duct. Attempts were then made to puncture the duct using a combination of different needles. After several attempts, catheterization was not successful. Therefore, the decision was made to attempt access of the thoracic duct at its insertion into the left subclavian vein. The patient's existing PICC line was cut and removed over a wire. A 5 French sheath was advanced into the left upper arm vein. Attempts were then made to catheterize the insertion of the thoracic duct using a combination of angiographic catheters and micro catheters. After a prolonged period, further attempts at this approach were abandoned. Attention was again turned to the thoracic duct which had re-opacified with Lipiodol. A 21 gauge trocar style needle was employed and used to successfully puncture the duct. A transcend wire was advanced up the duct. An initial attempt to advanced the Progreat catheter into the thoracic duct was not successful. Therefore, a 0.014 CXI catheter was selected and successfully advanced over the wire and into the thoracic duct. The catheter and wire combination were advanced as far into the duct is possible. Ultimately, a Glidewire Advantage wire was parked in the duct and the CXI catheter exchange for a 2.4 [place] Progreat catheter. The Progreat catheter was then navigated more distally into the duct to the level of the lower thoracic spine. Additional lymphadenopathy utilizing direct contrast injection demonstrates a region where the main cystic duct is obliterated by pathologic lymphadenopathy. The branches ram if I into dozens of  small thread like branches which then reconstitutes the main thoracic duct. Efforts were made to find a passage through this Maze of small vessels, but this was not successful. At this point, the decision was made to proceed with  embolization from the given location. As we had no backstop off coils to prevent embolization through the thoracic duct into the subclavian vein, we decided to place an occlusion balloon in the left subclavian vein. A Bentson wire was advanced through the left arm access into the right heart. A 12 x 40 mm mustang balloon was advanced over the wire and positioned across the origin of the thoracic duct. The balloon was inflated to full effacement. True fill liquid embolic glue was then reconstituted in Lipiodol at a concentration of 9:1. The glue mixture was then carefully injected through the Progreat catheter in till there was successful antegrade filling of nearly the entirety of the thoracic duct. The glue was taken up to the level of the aortic arch almost to the inferior aspect of the clavicle. No further injection was performed to prevent reflux into the subclavian vein. The microcatheter was then pulled back and the remaining thoracic duct embolized with glue. The catheter was then withdrawn. After several minutes the subclavian venous balloon was deflated and removed. A new dual lumen power injectable PICC was cut to 43 cm and advanced over the wire and position with the tip at the cavoatrial junction. The catheter flushes and aspirates easily. The catheter was capped and secured to the skin with an adhesive fixation device. IMPRESSION: 1. Successful placement of a right common femoral central venous catheter using ultrasound and fluoroscopic guidance. 2. Successful ultrasound-guided puncture of bilateral superficial inguinal lymph nodes with bilateral abdominopelvic lymphangiogram. 3. Successful liquid embolic embolization of the thoracic duct using true fill glue. 4. Exchange of left upper extremity PICC. Electronically Signed   By: Jacqulynn Cadet M.D.   On: 04/01/2022 15:47   DG CHEST PORT 1 VIEW  Result Date: 03/16/2022 CLINICAL DATA:  Bilateral chest tubes. EXAM: PORTABLE CHEST 1 VIEW COMPARISON:   Radiographs 03/15/2022.  CT 03/13/2022. FINDINGS: 0448 hours. Small caliber pigtail chest tubes remain in place bilaterally. Small residual pleural effusions bilaterally are unchanged with probable extension into the fissures on the left. Persistent bibasilar atelectasis and possible mild edema. No evidence of pneumothorax. The heart remains enlarged. There is aortic atherosclerosis. Left arm PICC extends to the level of the mid right atrium. IMPRESSION: Unchanged residual small bilateral pleural effusions. Possible mildly increased pulmonary edema. No pneumothorax. Electronically Signed   By: Richardean Sale M.D.   On: 03/16/2022 08:32   DG CHEST PORT 1 VIEW  Result Date: 03/15/2022 CLINICAL DATA:  Large volume output from chest tube. EXAM: PORTABLE CHEST 1 VIEW COMPARISON:  One-view chest x-ray 03/15/2022 at 5:37 a.m. FINDINGS: Heart is enlarged. Atherosclerotic calcifications are present at the aortic arch. The right pleural effusion is significantly decreased compared to the prior exam. No pneumothorax is present. Bilateral chest tubes are in place. The left pleural effusion is stable. Bibasilar atelectasis is noted. IMPRESSION: 1. Interval decrease in right pleural effusion. 2. Bilateral chest tubes without pneumothorax. 3. Stable left pleural effusion and basilar atelectasis. Electronically Signed   By: San Morelle M.D.   On: 03/15/2022 16:36    Labs:  CBC: Recent Labs    03/16/22 0318 03/16/22 2344 04/09/2022 0352 03/27/2022 1125 04/06/2022 1350 03/19/22 0422  WBC 28.3* 24.1* 24.4*  --   --  25.7*  HGB 11.3*  10.5* 10.1* 9.5* 8.8* 10.0*  HCT 35.3* 32.9* 31.1* 28.0* 26.0* 31.4*  PLT 175 164 156  --   --  144*     COAGS: Recent Labs    02/16/22 0318 03/16/22 2344 04/01/2022 0352  INR 1.3* 1.0 1.0  APTT 32  --   --      BMP: Recent Labs    03/16/22 0318 03/16/22 2344 04/07/2022 0352 04/03/2022 1125 04/12/2022 1350 03/18/22 0503  NA 132* 133* 134* 136 137 139  K 5.1 4.6 4.2  3.4* 3.3* 3.7  CL 99 98 100 99  --  105  CO2 '28 28 29  '$ --   --  27  GLUCOSE 114* 169* 193* 86  --  283*  BUN 26* 31* 28* 21  --  21  CALCIUM 7.4* 7.4* 7.7*  --   --  7.5*  CREATININE 0.37* 0.39* 0.34* <0.20*  --  <0.30*  GFRNONAA >60 >60 >60  --   --  NOT CALCULATED     LIVER FUNCTION TESTS: Recent Labs    03/13/22 0333 03/16/22 0318 03/27/2022 0352 03/18/22 0503  BILITOT 0.2* 0.4 0.2* 0.4  AST 25 56* 30 30  ALT 31 91* 56* 42  ALKPHOS 68 175* 143* 141*  PROT 4.8* 4.4* 4.4* 5.0*  ALBUMIN 2.2* 1.7* 1.6* 1.9*     Assessment and Plan:  Patient awake but reports feeling unwell. Patient reports she has not eaten today, and that she has no appetite.  Right chest tube intact with no significant output today. Left chest tube intact after placement by Critical Care on 7/4.  RN reports patient request no further interventions.  Please call IR with questions or concerns.  Electronically Signed: Lura Em, PA-C 03/19/2022, 2:22 PM   I spent a total of 15 Minutes at the the patient's bedside AND on the patient's hospital floor or unit, greater than 50% of which was counseling/coordinating care for status post lymphangiogram with thoracic duct embolization.

## 2022-03-19 NOTE — Procedures (Signed)
Pleural Fibrinolytic Administration Procedure Note  CHANDNI GAGAN  [id number]  [birth year]  Date:03/19/22  Time:12:16 PM   Provider Performing:Tyquisha Sharps Jerilynn Mages Verlee Monte   Procedure: Pleural Fibrinolysis Subsequent day [phone])  Indication(s) Fibrinolysis of complicated right pleural effusion  Consent Risks of the procedure as well as the alternatives and risks of each were explained to the patient and/or caregiver.  Consent for the procedure was obtained.   Anesthesia None   Time Out Verified patient identification, verified procedure, site/side was marked, verified correct patient position, special equipment/implants available, medications/allergies/relevant history reviewed, required imaging and test results available.   Sterile Technique Hand hygiene, gloves   Procedure Description Existing pleural catheter was cleaned and accessed in sterile manner.  '10mg'$  of tPA in 30cc of saline and '5mg'$  of dornase in 30cc of sterile water were injected into pleural space using existing pleural catheter.  Catheter will be clamped for 1 hour and then placed back to suction.   Complications/Tolerance None; patient tolerated the procedure well.  EBL None   Specimen(s) None

## 2022-03-19 NOTE — Progress Notes (Signed)
Chaplain followed up today with Lattie Haw after initially meeting her yesterday. She was having a difficult day and not feeling so well.  She was also feeling disappointed that she had missed the holiday festivities for the 4th of July yesterday. She requested prayer for healing and keeping her spirits lifted.  Chaplain provided prayer as well as listening and emotional support.    [address], [place] Pager, [phone]

## 2022-03-19 NOTE — Progress Notes (Signed)
OT Cancellation Note  Patient Details Name: [name] MRN: [id number] DOB: 1961/03/23   Cancelled Treatment:    Reason Eval/Treat Not Completed: Pain limiting ability to participate. RN reports patient in significant pain as well as with increased work of breathing and now on [place] at 25 L 50% fio2. Will hold and f/u as able when patient more appropriate for therapy.  Jaxie Racanelli L Avinash Maltos 03/19/2022, 10:43 AM

## 2022-03-19 NOTE — Progress Notes (Signed)
Pt seen, on 5L [place]/hfnc, HR102, rr26, spo2 99%.  Bipap nor hhfnc are indicated at this time.

## 2022-03-19 NOTE — Progress Notes (Signed)
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 Weight 64kg on admit and now 48.7 kg  Assessment:  9 YOF with pertinent PMH of CLL, depression, anxiety, HLD, HTN presents to [facility] ED on 6/2 unresponsive.  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 Patient also has bilateral chylothorax with high output. Pharmacy has been consulted for TPN.  Glucose / Insulin: new onset DM2 in addition to steroids, A1c 6.5% CBGs elevated Used 35 units SSI, Semglee 8/d Remains on prednisone 68m daily, sirolimus 227mdaily Electrolytes: improved and WNL Renal: SCr < 1 Hepatic: alk phos elevated at 141, tbili and TG WNL, albumin 1.9 Intake / Output: UOP 1375 mL, CT O/P 330 mL, LBM 7/2 GI Surgeries / Procedures:  7/3 thoracic duct embolization   Central access: PICC placed 02/27/22, replaced 7/3 TPN start date: 6/13-6/29, resumed 7/1   Nutritional Goals:  RD Estimated Needs Total Energy Estimated Needs: 2300-2600 Total Protein Estimated Needs: 140-160 grams Total Fluid Estimated Needs: >2.5 L  Current Nutrition:  TPN Low fat diet, no more than 7g fat per meal >> NPO for procedure Boost Breeze po TID (each: 250 kCal and 9g AA) Ensure MAX Protein daily (each: 150 kCal and 30g AA)  Plan:  Continue TPN at goal rate of 100 ml/hr Electrolytes in TPN: Na 80 mEq/L, K 10 mEq/L, Ca 5 mEq/L, Mg 5 mEq/L, Phos 12.5 mmol/L, Cl:Ac 1:1 Continue daily multivitamin PO supplement Continue resistant SSI and Semglee 8 units SQ daily TPN labs on Mon/Thurs  AbTawnya CrookPharmD, BCPS Clinical Pharmacist 03/19/2022 7:33 AM

## 2022-03-19 NOTE — Progress Notes (Signed)
PT Cancellation Note  Patient Details Name: FELISIA BALCOM MRN: [id number] DOB: 01-Jan-1961   Cancelled Treatment:    Reason Eval/Treat Not Completed: Pain limiting ability to participate   Claretha Cooper 03/19/2022, 11:19 AM [place] Office [phone] Weekend pager-[phone]

## 2022-03-19 NOTE — Progress Notes (Signed)
NAME:  [name], MRN:  [id number], DOB:  [birth year], LOS: 55 ADMISSION DATE:  02/19/2022, CONSULTATION DATE:  6/2 REFERRING MD:  Dr. Alvino Chapel, CHIEF COMPLAINT:  Unresponsive; Acute resp failure   BRIEF  Patient is a 61 year old female with pertinent PMH of CLL, depression, anxiety, HLD, HTN presents to [facility] ED on 6/2 unresponsive.  On 6/2 patient called EMS for abdominal pain.  Patient initially was alert and talking but became unresponsive with AMS.  Patient was hypoxic with agonal breathing and was placed on NRB.  Patient was noted to be hypotensive.  Patient transported to [facility] ED.  Upon arrival to [facility] ED patient remained unresponsive with agonal respirations.  Patient intubated for airway protection and hypoxia.  BP initially 81/65.  Given IV fluids.  Started on levo.  Fever 100.9 F and WBC 104.4.  Started on cefepime, Flagyl, Vanco.  Cultures pending.  Glucose 198, BNP 534, troponin 109 then 112, LA 4.4 then 1.8, UA unremarkable, UDS negative.  CXR with large left pleural effusion and small right pleural effusion.  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  CT abdomen with ascending colitis constipation, small intestine wall thickening possibly related to gastroenteritis; small volume ascites; extensive abdominopelvic and inguinal adenopathy.   PCCM was consulted for bilateral pleural effusion  Pertinent  Medical History    has a past medical history of Anxiety, Chronic abdominal pain, Chronic lymphocytic leukemia (CLL), B-cell ([place]) (04/04/2016), CLL (chronic lymphocytic leukemia) ([place]), Depression with anxiety (07/07/2015), Family history of hyperlipidemia (12/14/2019), H. 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   has a past surgical history that includes Surgery of left middle finger on left hand (childhood ); Tubal ligation; Cholecystectomy; Colonoscopy (02/07/2011); Hammer toe surgery (Left); Axillary lymph node biopsy (Right, 03/28/2016); Hysteroscopy with D & C (N/A, 07/30/2016); Thoracentesis (Left, 02/27/2022); Chest tube insertion (N/A, 02/27/2022); IR EMBO VENOUS NOT HEMORR HEMANG  INC GUIDE ROADMAPPING (04/12/2022); IR US Guide Vasc Access Right (04/06/2022); IR US Guide Vasc Access Left (03/23/2022); IR LYMPHANGIOGRAM PEL/ABD BILAT (03/30/2022); IR Fluoro Guide CV Line Right (03/15/2022); and Radiology with anesthesia (N/A, 03/16/2022).  Significant Hospital Events: Including procedures, antibiotic start and stop dates in addition to other pertinent events   6/2 Presented [facility] ER unresponsive, intubated, shock on pressors > to [facility].  CT chest/abd w/ extensive LAN 6/3 Right chest tube placed. Left thora 2000 ml removed - milky 6/4 Extubated, off pressors.  Pleural fluid exudative, cx negative, lymphocytic. 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 Started on pulse dose steroids '50mg'$  pred.  6/7 Chest tube output over a liter 6/8 Left thoracentesis for 1400 cc of fluid 6/10 Chest tube removed 6/12 S/p right chest tube placement with 3.1 L drained 6/3 Tx from [facility] to [facility] 6/13 for initiation of palliative radiotherapy 6/14 No chest pain. Saturation 96% on 2 L. Chest tube drained 190 cc of milky fluid 6/15 Fell. CXR stable. Doing well. Got SIM for XRT.  RA.  Mod-large effusion on L. Refused CT. Octreotide. 6/16 TPN.  On [place] O2 . R CT 1000 cc out.  Started XRT 6/15, rituximab is being considered 6/17 CT issues with placement. Not sure if she wants new chest tube.  6/18 Dislodged right chest tube completely.  6/19 PleurX deferred due to poor candidate outpatient. Bilateral chest tubes placed due to worsening respiratory distress.  6/20 - new onset hypotension after bilateral  chest tube and midodrine started 6/21 2.8L out from CT in last 24 hours. IR consulted for thoracic duct embolization. Per IR: Lymphangiogram with thoracic duct embolization is not a routine procedure and will need to see when this procedure could potentially be performed 6/24-continues with significant output from chest tubes 6/25 - Afebrile .decreased chest tube output 6/26 - walked with PT in my presene.Bilateral chest tubes with draining chylothorax. IR uncertain (6/31/49) being STARTED SIROLIMUS  6/27- Last 90 min > 50cc drain of chyle on left sidde. Rt side - hardly any ouput.  Growing consensus that thoracic duct ligation is indicated based on curbside with Dr Kipp Brood surgeon and also running case by surgeon in [place], Dr Lorenso Courier of oncology. Started on sirolimus yesterday, She is eating, ambulating and also on TPN. No fevers D/wDr McCullohgh of IR - he has experience with TDE and will plan for procedure at [facility] or [facility] next few to several day. Needs General and a 6h block of time Patient agreeable for  TDE RT chest tube not draining well - new minor fissure fluid collection 6/28 -overnight dyspneic . Now on 3L [place]. LEft chst tube is leaking. Rt chest tube not much drain. TPN ongoing. Good appetie. IR considering TDE 04/13/2022 - patient aware 6/29 TPN stopped, doing calorie count. CT CAP with large bl effusions and tubes in ok position 6/30 tPA in left chest tube with good output 7/1 restarted TPN - not meeting caloric needs and wish to give her every shot possible at pleurodesis following TDE on 7/3. tPA in right chest tube 7/3 underwent TDE. Encephalopathic and increased WOB after procedure, placed on HHFNC. Dislodged left chest tube. 7/4 left chest tube replaced   Interim History / Subjective:   Felt much better with sudden drainage from left chest tube which doesn't actually appear kinked at bedside - seems like we might just be looking directly at the plane of the chest tube's path on a  portable CXR.  Objective   Blood pressure 131/84, pulse (!) 108, temperature 98.8 F (37.1 C), temperature source Oral, resp. rate (!) 22, height '5\' 5"'$  (1.651 m), weight 53.5 kg, last menstrual period 07/25/2016, SpO2 94 %.    FiO2 (%):  [50 %] 50 %   Intake/Output Summary (Last 24 hours) at 03/19/2022 1005 Last data filed at 03/19/2022 0530 Gross per 24 hour  Intake 1082.85 ml  Output 1830 ml  Net -747.15 ml   Filed Weights   03/16/22 0500 04/03/2022 1752 03/18/22 1228  Weight: 48.7 kg 53.4 kg 53.5 kg    I/O last 3 completed shifts: In: 2037.3 [I.V.:1977.3] Out: 2455 [Urine:1825; Chest Tube:630]  Intake/Output      07/04 0701 07/05 0700 07/05 0701 07/06 0700   I.V. (mL/kg) 1072.9 (20.1)    IV Piggyback     Chest Tube 30    Total Intake(mL/kg) 1102.9 (20.6)    Urine (mL/kg/hr) 1500 (1.2)    Chest Tube 630    Total Output 2130    Net -1027.2         Urine Occurrence 2 x     Physical exam: General appearance: 61 y.o., female, distressed, chronically ill appearing  Eyes: PERRL, tracking appropriately HENT: MMM Lungs: diminished bl, tachypneic with increased respiratory effort CV: tachy RR, no murmur  Abdomen: Soft, mildly tender; distended, BS hypoactive  Extremities: No peripheral edema, warm Skin: Normal turgor and texture; no rash Psych: Appropriate affect Neuro: drowsy arousable with verbal stim, no focal deficit   Chest tube on left with a lot of fibrinous/serosanguinous output to suction Chest tube on right with little output to suction  CXR with ?loculated bilateral pleural effusions vs alveolar opacities  Assessment & Plan:   Acute hypoxic respiratory failure, improving Again likely due to reaccumulation of chylothoraces - lytics to right chest tube today - switch to conventional HFNC today  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  right axillary node was biopsies on 02/18/22 showing B cells consistent with her history of lymphoma and CLL. She is s/p TDE 7/3 with IR. - continue octreotide 02/25/22 -  - restart TPN since 02/25/22 - 03/13/22, 7/1- - continue prednisone '50mg'$  per day since 02/18/22 -  - on sirolimus since 03/10/22 - midodrine as below - trial tPA for right chest tube today (2nd course on right) - o2 for goa pulse ox > 94%  Hypotension  - In setting of chronic disease, profound malnutrition but onset 6/20 after chest tube -continue midodrine   Severe protein calorie malnutrition  - back on TPN    Best Practice:  Goals of care: Palliative care reengaged 7/4. Continuing supportive measures for at least a few more days to see if chylothorax output diminishes after TDE. Otherwise she and family would consider pursuing comfort measures.     Worth

## 2022-03-20 ENCOUNTER — Inpatient Hospital Stay (HOSPITAL_COMMUNITY): Payer: Medicaid Other

## 2022-03-20 DIAGNOSIS — J9601 Acute respiratory failure with hypoxia: Secondary | ICD-10-CM | POA: Diagnosis not present

## 2022-03-20 DIAGNOSIS — J940 Chylous effusion: Secondary | ICD-10-CM | POA: Diagnosis not present

## 2022-03-20 DIAGNOSIS — G893 Neoplasm related pain (acute) (chronic): Secondary | ICD-10-CM | POA: Diagnosis not present

## 2022-03-20 LAB — COMPREHENSIVE METABOLIC PANEL
ALT: 54 U/L — ABNORMAL HIGH (ref 0–44)
AST: 58 U/L — ABNORMAL HIGH (ref 15–41)
Albumin: 1.5 g/dL — ABNORMAL LOW (ref 3.5–5.0)
Alkaline Phosphatase: 228 U/L — ABNORMAL HIGH (ref 38–126)
Anion gap: 3 — ABNORMAL LOW (ref 5–15)
BUN: 33 mg/dL — ABNORMAL HIGH (ref 8–23)
CO2: 32 mmol/L (ref 22–32)
Calcium: 8.1 mg/dL — ABNORMAL LOW (ref 8.9–10.3)
Chloride: 104 mmol/L (ref 98–111)
Creatinine, Ser: 0.33 mg/dL — ABNORMAL LOW (ref 0.44–1.00)
GFR, Estimated: >60 mL/min (ref >60)
Glucose, Bld: 157 mg/dL — ABNORMAL HIGH (ref 70–99)
Potassium: 4.3 mmol/L (ref 3.5–5.1)
Sodium: 139 mmol/L (ref 135–145)
Total Bilirubin: 0.7 mg/dL (ref 0.3–1.2)
Total Protein: 4.5 g/dL — ABNORMAL LOW (ref 6.5–8.1)

## 2022-03-20 LAB — TRIGLYCERIDES: Triglycerides: 87 mg/dL (ref <150)

## 2022-03-20 LAB — CBC WITH DIFFERENTIAL/PLATELET
Abs Immature Granulocytes: 0.04 10*3/uL (ref 0.00–0.07)
Basophils Absolute: 0 10*3/uL (ref 0.0–0.1)
Basophils Relative: 0 %
Eosinophils Absolute: 0 10*3/uL (ref 0.0–0.5)
Eosinophils Relative: 0 %
HCT: 32 % — ABNORMAL LOW (ref 36.0–46.0)
Hemoglobin: 10.1 g/dL — ABNORMAL LOW (ref 12.0–15.0)
Immature Granulocytes: 0 %
Lymphocytes Relative: 73 %
Lymphs Abs: 14.7 10*3/uL — ABNORMAL HIGH (ref 0.7–4.0)
MCH: 29.5 pg (ref 26.0–34.0)
MCHC: 31.6 g/dL (ref 30.0–36.0)
MCV: 93.6 fL (ref 80.0–100.0)
Monocytes Absolute: 0.1 10*3/uL (ref 0.1–1.0)
Monocytes Relative: 1 %
Neutro Abs: 5.3 10*3/uL (ref 1.7–7.7)
Neutrophils Relative %: 26 %
Platelets: 146 10*3/uL — ABNORMAL LOW (ref 150–400)
RBC: 3.42 MIL/uL — ABNORMAL LOW (ref 3.87–5.11)
RDW: 19.6 % — ABNORMAL HIGH (ref 11.5–15.5)
WBC: 20.1 10*3/uL — ABNORMAL HIGH (ref 4.0–10.5)
nRBC: 0 % (ref 0.0–0.2)

## 2022-03-20 LAB — GLUCOSE, CAPILLARY
Glucose-Capillary: 188 mg/dL — ABNORMAL HIGH (ref 70–99)
Glucose-Capillary: 154 mg/dL — ABNORMAL HIGH (ref 70–99)
Glucose-Capillary: 182 mg/dL — ABNORMAL HIGH (ref 70–99)
Glucose-Capillary: 240 mg/dL — ABNORMAL HIGH (ref 70–99)
Glucose-Capillary: 216 mg/dL — ABNORMAL HIGH (ref 70–99)

## 2022-03-20 LAB — LACTATE DEHYDROGENASE: LDH: 221 U/L — ABNORMAL HIGH (ref 98–192)

## 2022-03-20 LAB — MAGNESIUM: Magnesium: 2.1 mg/dL (ref 1.7–2.4)

## 2022-03-20 LAB — PHOSPHORUS: Phosphorus: 3.4 mg/dL (ref 2.5–4.6)

## 2022-03-20 MED ORDER — SODIUM CHLORIDE 0.9% FLUSH
10.0000 mL | Freq: Three times a day (TID) | INTRAVENOUS | Status: DC
Start: 2022-03-20 — End: 2022-03-20

## 2022-03-20 MED ORDER — SODIUM CHLORIDE (PF) 0.9 % IJ SOLN
10.0000 mg | Freq: Once | INTRAMUSCULAR | Status: AC
  Administered 2022-03-20: 10 mg via INTRAPLEURAL
  Filled 2022-03-20: qty 10

## 2022-03-20 MED ORDER — HYDROMORPHONE HCL 1 MG/ML IJ SOLN
0.5000 mg | INTRAMUSCULAR | Status: DC | PRN
  Administered 2022-03-20 – 2022-03-22 (×11): 0.5 mg via INTRAVENOUS
  Filled 2022-03-20 (×10): qty 1

## 2022-03-20 MED ORDER — FAMOTIDINE IN NACL 20-0.9 MG/50ML-% IV SOLN
20.0000 mg | Freq: Two times a day (BID) | INTRAVENOUS | Status: DC
  Administered 2022-03-20 – 2022-03-25 (×10): 20 mg via INTRAVENOUS
  Filled 2022-03-20 (×11): qty 50

## 2022-03-20 MED ORDER — SODIUM CHLORIDE 0.9% FLUSH
10.0000 mL | Freq: Three times a day (TID) | INTRAVENOUS | Status: DC
Start: 2022-03-20 — End: 2022-03-25
  Administered 2022-03-20 – 2022-03-25 (×14): 10 mL

## 2022-03-20 MED ORDER — TRAVASOL 10 % IV SOLN
INTRAVENOUS | Status: AC
  Filled 2022-03-20: qty 1500

## 2022-03-20 MED ORDER — HYDROMORPHONE HCL 1 MG/ML IJ SOLN
INTRAMUSCULAR | Status: AC
  Filled 2022-03-20: qty 1

## 2022-03-20 MED ORDER — PREDNISONE 10 MG PO TABS
50.0000 mg | ORAL_TABLET | Freq: Every day | ORAL | Status: DC
  Administered 2022-03-22 – 2022-03-25 (×4): 50 mg via ORAL
  Filled 2022-03-20 (×5): qty 1

## 2022-03-20 NOTE — Progress Notes (Signed)
NAME:  [name], MRN:  [id number], DOB:  Jun 15, 1961, LOS: 77 ADMISSION DATE:  03/12/2022, CONSULTATION DATE:  6/2 REFERRING MD:  Dr. Alvino Chapel, CHIEF COMPLAINT:  Unresponsive; Acute resp failure   BRIEF  Patient is a 61 year old female with pertinent PMH of CLL, depression, anxiety, HLD, HTN presents to [facility] ED on 6/2 unresponsive.  On 6/2 patient called EMS for abdominal pain.  Patient initially was alert and talking but became unresponsive with AMS.  Patient was hypoxic with agonal breathing and was placed on NRB.  Patient was noted to be hypotensive.  Patient transported to [name] ED.  Upon arrival to [facility] ED patient remained unresponsive with agonal respirations.  Patient intubated for airway protection and hypoxia.  BP initially 81/65.  Given IV fluids.  Started on levo.  Fever 100.9 F and WBC 104.4.  Started on cefepime, Flagyl, Vanco.  Cultures pending.  Glucose 198, BNP 534, troponin 109 then 112, LA 4.4 then 1.8, UA unremarkable, UDS negative.  CXR with large left pleural effusion and small right pleural effusion.  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  CT abdomen with ascending colitis constipation, small intestine wall thickening possibly related to gastroenteritis; small volume ascites; extensive abdominopelvic and inguinal adenopathy.   PCCM was consulted for bilateral pleural effusion  Pertinent  Medical History    has a past medical history of Anxiety, Chronic abdominal pain, Chronic lymphocytic leukemia (CLL), B-cell ([place]) (04/04/2016), CLL (chronic lymphocytic leukemia) ([place]), Depression with anxiety (07/07/2015), Family history of hyperlipidemia (12/14/2019), H. 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   has a past surgical history that includes Surgery of left middle finger on left hand (childhood ); Tubal ligation; Cholecystectomy; Colonoscopy (02/07/2011); Hammer toe surgery (Left); Axillary lymph node biopsy (Right, 03/28/2016); Hysteroscopy with D & C (N/A, 07/30/2016); Thoracentesis (Left, 02/16/2022); Chest tube insertion (N/A, 03/07/2022); IR EMBO VENOUS NOT HEMORR HEMANG  INC GUIDE ROADMAPPING (04/02/2022); IR US Guide Vasc Access Right (04/07/2022); IR US Guide Vasc Access Left (04/01/2022); IR LYMPHANGIOGRAM PEL/ABD BILAT (04/06/2022); IR Fluoro Guide CV Line Right (04/08/2022); and Radiology with anesthesia (N/A, 03/21/2022).  Significant Hospital Events: Including procedures, antibiotic start and stop dates in addition to other pertinent events   6/2 Presented [facility] ER unresponsive, intubated, shock on pressors > to [facility].  CT chest/abd w/ extensive LAN 6/3 Right chest tube placed. Left thora 2000 ml removed - milky 6/4 Extubated, off pressors.  Pleural fluid exudative, cx negative, lymphocytic. 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 Started on pulse dose steroids '50mg'$  pred.  6/7 Chest tube output over a liter 6/8 Left thoracentesis for 1400 cc of fluid 6/10 Chest tube removed 6/12 S/p right chest tube placement with 3.1 L drained 6/3 Tx from [facility] to [facility] 6/13 for initiation of palliative radiotherapy 6/14 No chest pain. Saturation 96% on 2 L. Chest tube drained 190 cc of milky fluid 6/15 Fell. CXR stable. Doing well. Got SIM for XRT.  RA.  Mod-large effusion on L. Refused CT. Octreotide. 6/16 TPN.  On [place] O2 . R CT 1000 cc out.  Started XRT 6/15, rituximab is being considered 6/17 CT issues with placement. Not sure if she wants new chest tube.  6/18 Dislodged right chest tube completely.  6/19 PleurX deferred due to poor candidate outpatient. Bilateral chest tubes placed due to worsening respiratory distress.  6/20 - new onset hypotension after bilateral  chest tube and midodrine started 6/21 2.8L out from CT in last 24 hours. IR consulted for thoracic duct embolization. Per IR: Lymphangiogram with thoracic duct embolization is not a routine procedure and will need to see when this procedure could potentially be performed 6/24-continues with significant output from chest tubes 6/25 - Afebrile .decreased chest tube output 6/26 - walked with PT in my presene.Bilateral chest tubes with draining chylothorax. IR uncertain (01/22/31) being STARTED SIROLIMUS  6/27- Last 90 min > 50cc drain of chyle on left sidde. Rt side - hardly any ouput.  Growing consensus that thoracic duct ligation is indicated based on curbside with Dr Kipp Brood surgeon and also running case by surgeon in [place], Dr Lorenso Courier of oncology. Started on sirolimus yesterday, She is eating, ambulating and also on TPN. No fevers D/wDr McCullohgh of IR - he has experience with TDE and will plan for procedure at [facility] or [facility] next few to several day. Needs General and a 6h block of time Patient agreeable for  TDE RT chest tube not draining well - new minor fissure fluid collection 6/28 -overnight dyspneic . Now on 3L [place]. LEft chst tube is leaking. Rt chest tube not much drain. TPN ongoing. Good appetie. IR considering TDE 03/20/2022 - patient aware 6/29 TPN stopped, doing calorie count. CT CAP with large bl effusions and tubes in ok position 6/30 tPA in left chest tube with good output 7/1 restarted TPN - not meeting caloric needs and wish to give her every shot possible at pleurodesis following TDE on 7/3. tPA in right chest tube 7/3 underwent TDE. Encephalopathic and increased WOB after procedure, placed on HHFNC. Dislodged left chest tube. 7/4 left chest tube replaced  7/5 2nd dose of tPA to right chest tube 7/6 3rd dose of tPA to right chest tube, 2nd dose of tPA to left chest tube  Interim History / Subjective:   Feeling better overall but still pretty miserable, lots of abdominal  pain  Objective   Blood pressure 116/81, pulse (!) 103, temperature 98.4 F (36.9 C), temperature source Oral, resp. rate (!) 23, height '5\' 5"'$  (1.651 m), weight 53.5 kg, last menstrual period 07/25/2016, SpO2 99 %.    FiO2 (%):  [40 %-50 %] 40 %   Intake/Output Summary (Last 24 hours) at 03/20/2022 0957 Last data filed at 03/20/2022 0800 Gross per 24 hour  Intake 2982.74 ml  Output 5140 ml  Net -2157.26 ml   Filed Weights   04/06/2022 1752 03/18/22 1228  Weight: 53.4 kg 53.5 kg    I/O last 3 completed shifts: In: 3399.1 [I.V.:3316.4; IV Piggyback:42.7] Out: 6712 [Urine:2100; Chest Tube:3920]  Intake/Output      07/05 0701 07/06 0700 07/06 0701 07/07 0700   P.O.  50   I.V. (mL/kg) 2273.5 (42.5) 596.5 (11.1)   IV Piggyback 42.7    Chest Tube 20    Total Intake(mL/kg) 2336.3 (43.7) 646.5 (12.1)   Urine (mL/kg/hr) 1650 (1.3)    Chest Tube 3490    Total Output 5140    Net -2803.7 +646.5        Urine Occurrence 1 x     Physical exam: General appearance: 61 y.o., female, distressed, chronically ill appearing  Eyes: PERRL, tracking appropriately HENT: MMM Lungs: diminished bl, tachypneic with increased respiratory effort CV: tachy RR, no murmur  Abdomen: Soft, mildly tender;  distended, BS hypoactive   Extremities: No peripheral edema, warm Skin: Normal turgor and texture; no rash Psych: Appropriate affect Neuro: drowsy arousable with verbal stim, no focal deficit   Chest tube on right with 1.5L output, less milky than before TDE Chest tube on left with 1.9L output way less milky than before TDE  CT Chest with bilateral residual loculated fluid, bilateral lower lobe predominant consolidation/ggo  Assessment & Plan:    Acute hypoxic respiratory failure, improving 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 right axillary node was biopsies on 02/18/22  showing B cells consistent with her history of lymphoma and CLL. She is s/p TDE 7/3 with IR. - continue octreotide 02/25/22 -  - restart TPN since 02/25/22 - 03/13/22, 7/1- - continue prednisone '50mg'$  per day since 02/18/22 -  - on sirolimus since 03/10/22 - midodrine as below - trial tPA for right chest tube today (3nd course on right), tPA to left chest tube later today - tentatively plan on talc tube pleurodesis to at least one side tomorrow, possibly both but will need dilaudid PCA - consider holding/decreasing steroid dose tomorrow or for a couple of days to help promote inflammatory response - o2 for goa pulse ox > 94%  Hypotension  - In setting of chronic disease, profound malnutrition but onset 6/20 after chest tube -continue midodrine   Severe protein calorie malnutrition  - back on TPN    Best Practice:  Goals of care: Palliative care reengaged 7/4. Continuing supportive measures for at least a few more days to see if chylothorax output diminishes after TDE - and indeed output does seem to be improving. Otherwise she and family would consider pursuing comfort measures.     [place]

## 2022-03-20 NOTE — Progress Notes (Signed)
Chaplain followed up with [name].  She is still not feeling well today, but declined talking at this time.  [address], [place] Pager, [phone]

## 2022-03-20 NOTE — Progress Notes (Signed)
PHARMACY - TOTAL PARENTERAL NUTRITION CONSULT NOTE  Indication:  bilateral chylothorax with high chest tube output  Patient Measurements: Height: _0  (165.1 cm) Weight:  (bed malfunction) IBW/kg (Calculated) : 57 TPN AdjBW (KG): 64 Body mass index is 19.63 kg/m. Weight 64kg on admit and now 48.7 kg  Assessment:  73 YOF with pertinent PMH of CLL, depression, anxiety, HLD, HTN presents to [facility] ED on 6/2 unresponsive.  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 Patient also has bilateral chylothorax with high output. Pharmacy has been consulted for TPN.  Glucose / Insulin: new onset DM2 in addition to steroids, A1c 6.5% CBGs elevated Used 34 units SSI, Semglee 8/d Remains on prednisone 37m daily, sirolimus 212mdaily Electrolytes: improved and WNL Renal: SCr < 1 Hepatic: alk phos elevated at 228, tbili and TG WNL, albumin 1.5 Intake / Output: UOP 1350 mL, CT O/P 3415 mL, LBM 7/4 GI Surgeries / Procedures:  7/3 thoracic duct embolization   Central access: PICC placed 02/27/22, replaced 7/3 TPN start date: 6/13-6/29, resumed 7/1   Nutritional Goals:  RD Estimated Needs Total Energy Estimated Needs: 2300-2600 Total Protein Estimated Needs: 140-160 grams Total Fluid Estimated Needs: >2.5 L  Current Nutrition:  TPN Low fat diet, no more than 7g fat per meal >> NPO for procedure Boost Breeze po TID (each: 250 kCal and 9g AA) Ensure MAX Protein daily (each: 150 kCal and 30g AA)  Plan:  Continue TPN at goal rate of 100 ml/hr Electrolytes in TPN: Na 80 mEq/L, K 10 mEq/L, Ca 5 mEq/L, Mg 5 mEq/L, Phos 12.5 mmol/L, Cl:Ac 1:1 Continue daily multivitamin PO supplement Continue resistant SSI and Semglee 8 units SQ daily TPN labs on Mon/Thurs. Will recheck potassium tomorrow morning given increase with minimal replacement in TPN (~ 20 mEq total) and previous sensitivity to relatively small adjustments to potassium.  AbTawnya CrookPharmD, BCPS Clinical  Pharmacist 03/20/2022 8:10 AM

## 2022-03-20 NOTE — Procedures (Signed)
Pleural Fibrinolytic Administration Procedure Note  [name]  [id number]  [birth year]  Date:03/20/22  Time:12:22 PM   Provider Performing:Kamilah Correia M Verlee Monte   Procedure: Pleural Fibrinolysis Subsequent day [id number])  Indication(s) Fibrinolysis of complicated right pleural effusion  Consent Risks of the procedure as well as the alternatives and risks of each were explained to the patient and/or caregiver.  Consent for the procedure was obtained.   Anesthesia None   Time Out Verified patient identification, verified procedure, site/side was marked, verified correct patient position, special equipment/implants available, medications/allergies/relevant history reviewed, required imaging and test results available.   Sterile Technique Hand hygiene, gloves   Procedure Description Existing pleural catheter was cleaned and accessed in sterile manner.  '10mg'$  of tPA in 30cc of saline was injected into pleural space using existing pleural catheter.  Catheter will be clamped for 1 hour and then placed back to suction.   Complications/Tolerance None; patient tolerated the procedure well.  EBL None   Specimen(s) None

## 2022-03-20 NOTE — Progress Notes (Signed)
Supervising Physician: Ruthann Cancer  [name] Status:  [name] - In-pt  Chief Complaint:  Status post lymphangiogram with thoracic duct embolization 03/29/2022 with Dr. Laurence Ferrari  Subjective:   [name] awake and accompanied by her niece during exam today. She reports feeling "horrible" but states that this is due to nausea and pain. She reports no specific concerns related to her chest tubes.  Allergies: Feraheme [ferumoxytol]  Medications: Prior to Admission medications   Medication Sig Start Date End Date Taking? Authorizing Provider  lisinopril (ZESTRIL) 10 MG tablet TAKE 1 TABLET BY MOUTH ONCE A DAY. [name] not taking: Reported on 02/16/2022 03/20/20   Fayrene Helper, MD  Vitamin D, Ergocalciferol, (DRISDOL) 1.25 MG (50000 UNIT) CAPS capsule Take 1 capsule (50,000 Units total) by mouth every 7 (seven) days. [name] not taking: Reported on 02/16/2022 12/15/19   Perlie Mayo, NP  fluticasone [facility]) 50 MCG/ACT nasal spray Place 1 spray into the nose daily. 07/02/11 11/27/11  Fayrene Helper, MD     Vital Signs: BP 106/66   Pulse (!) 109   Temp 98.7 F (37.1 C) (Oral)   Resp (!) 28   Ht '5\' 5"'$  (1.651 m)   Wt 117 lb 15.1 oz (53.5 kg)   LMP 07/25/2016   SpO2 95%   BMI 19.63 kg/m   Physical Exam Vitals reviewed.  Constitutional:      General: She is not in acute distress.    Appearance: She is ill-appearing.  HENT:     Nose:     Comments: High flow O2 to [place] Cardiovascular:     Rate and Rhythm: Tachycardia present.  Pulmonary:     Effort: No respiratory distress.     Comments: Right chest tube in place  ~710cc of orange-red fluid noted today.  Insertion site unremarkable with sutures in place.  Dressings clean dry and intact.  Left chest tube in place with ~140 cc of orange-red fluid noted today. Left chest tube site unremarkable with sutures in place. Dressing clean dry and intact. Skin:    General: Skin is warm and dry.  Neurological:     Mental  Status: She is alert.     Imaging: CT CHEST WO CONTRAST  Result Date: 03/20/2022 CLINICAL DATA:  Bilateral chylothorax. Bilateral chest tubes in place status post fibrinolysis. [name] underwent lymphangiogram and thoracic duct embolization on 03/30/2022. EXAM: CT CHEST WITHOUT CONTRAST TECHNIQUE: Multidetector CT imaging of the chest was performed following the standard protocol without IV contrast. RADIATION DOSE REDUCTION: This exam was performed according to the departmental dose-optimization program which includes automated exposure control, adjustment of the mA and/or kV according to [name] size and/or use of iterative reconstruction technique. COMPARISON:  Multiple radiographs, most recently done 03/20/2022. CT 03/13/2022. FINDINGS: Cardiovascular: Left arm PICC projects to the inferior aspect of the right atrium. Atherosclerosis of the aorta, great vessels and coronary arteries. A small pericardial effusion appears minimally enlarged in the interval. The heart size is normal. Mediastinum/Nodes: Bulky supraclavicular and axillary adenopathy bilaterally appears similar to the previous study. Mediastinal and hilar assessment is limited by the lack of intravenous contrast and paucity of mediastinal fat. No progressive mediastinal adenopathy identified. There is high density within the posterior mediastinum attributed to the recent lymphangiogram. Lungs/Pleura: Bilateral pigtail catheters remain in place inferiorly. The volume of the bilateral pleural effusions has substantially decreased from the prior chest CT. There are residual loculated components posteriorly and inferiorly on the right. On the left, the large loculated components are  within the fissure and superiorly. No evidence of pneumothorax. Interval improved aeration of both lung bases with residual bibasilar airspace opacities. Underlying emphysema and central airway thickening. Upper abdomen: Increased volume of ascites within the upper  abdomen. High density in the retroperitoneum attributed to recent lymphangiogram. Musculoskeletal/Chest wall: There is no chest wall mass or suspicious osseous finding. IMPRESSION: 1. Significant improvement in the bilateral pleural effusions compared with previous CT of 1 week ago. There are residual loculated components bilaterally as described above. 2. Resulting improvement in both lung bases with residual bibasilar airspace opacities. 3. New moderate ascites within the visualized upper abdomen. 4. Minimal enlargement of a small pericardial effusion. 5. Sequela of recent lymphangiogram. 6. Grossly stable underlying thoracic inlet and axillary adenopathy attributed to underlying chronic lymphocytic leukemia. Correlate clinically. 7. Coronary and aortic atherosclerosis (ICD10-I70.0). Emphysema (ICD10-J43.9). Electronically Signed   By: Richardean Sale M.D.   On: 03/20/2022 08:49   DG CHEST PORT 1 VIEW  Result Date: 03/20/2022 CLINICAL DATA:  Pleural effusion, bilateral chest tubes EXAM: PORTABLE CHEST 1 VIEW COMPARISON:  03/19/2022 FINDINGS: Bibasilar chest tubes. Left PICC line is unchanged. Decreased pleural effusions. Improved lung aeration. Heart size is within normal limits. IMPRESSION: Decreased pleural effusions with improved lung aeration. Electronically Signed   By: Macy Mis M.D.   On: 03/20/2022 08:01   DG Abd 1 View  Result Date: 03/19/2022 CLINICAL DATA:  Abdominal distension and discomfort. Recent lymphangiogram. EXAM: ABDOMEN - 1 VIEW COMPARISON:  04/10/2022 FINDINGS: Contrast noted from recent lymphangiogram. There is also contrast in the colon. Bilateral pleural drainage catheters are noted. Scattered air in the stomach, small bowel and colon but no findings for small bowel obstruction or free air. IMPRESSION: Nonobstructive bowel gas pattern. Contrast from recent lymphangiogram. Electronically Signed   By: Marijo Sanes M.D.   On: 03/19/2022 12:52   DG CHEST PORT 1 VIEW  Addendum  Date: 03/19/2022   ADDENDUM REPORT: 03/19/2022 09:00 ADDENDUM: There is a kink in the left-sided chest tube at the site of the entry into the pleural space. Electronically Signed   By: Frazier Richards M.D.   On: 03/19/2022 09:00   Result Date: 03/19/2022 CLINICAL DATA:  142230 pleural effusions, bilateral chest tubes. EXAM: PORTABLE CHEST 1 VIEW COMPARISON:  March 18, 2022 FINDINGS: The heart size and mediastinal contours are stable. Again seen is the left arm PICC line with its tip in the right atrium. Bilateral chest tubes are stable. Again seen is the opacification in the upper 2/3 of the left hemithorax likely due to the loculated pleural effusion and is stable to mildly increased in the interim. The opacity in the mid region of the right hemithorax has increased in the interim of likely increased fluid in the fissure. Stable radiopaque densities of likely related to recent thoracic duct embolization. The visualized skeletal structures are unremarkable. IMPRESSION: Likely loculated pleural effusion in the upper 2/3 of the left hemithorax is stable to mildly increased in the interim. Loculated effusion likely in the right fissure has increased in the interim. Electronically Signed: By: Frazier Richards M.D. On: 03/19/2022 08:04   DG CHEST PORT 1 VIEW  Result Date: 03/18/2022 CLINICAL DATA:  Chest tube in place. EXAM: PORTABLE CHEST 1 VIEW COMPARISON:  Chest radiograph earlier today, CT 03/13/2022 FINDINGS: Upper extremity PICC tip in the right atrium. Right basilar pigtail catheter. This is unchanged in position. There is a new pigtail catheter projecting over the lower left lung base. Persistent hazy bilateral lung opacities. Subtotal  opacification of the 1/2 of left hemithorax, may represent loculated pleural effusion or lobar collapse. Rounded density in the right mid lung is favored to represent fluid in the fissure. Radiopaque densities projecting over the retroperitoneum and mediastinum likely related to recent  thoracic duct embolization. No visible pneumothorax IMPRESSION: 1. New pigtail catheter projecting over the lower left lung base. Stable positioning of right pigtail catheter. 2. Progressive opacification of the upper left hemithorax which may be due to loculated pleural effusion or lobar collapse/consolidation. Rounded density in the right mid lung favors to be fluid in the fissure. Persistent hazy basilar opacities likely subpulmonic effusions. 3. No pneumothorax. Electronically Signed   By: Keith Rake M.D.   On: 03/18/2022 19:20   DG CHEST PORT 1 VIEW  Result Date: 03/18/2022 CLINICAL DATA:  Status post removal of left chest tube. EXAM: PORTABLE CHEST 1 VIEW COMPARISON:  03/16/2022 FINDINGS: There is a left arm PICC line with tip in the right atrium. Stable cardiomediastinal contours. Interval removal of left chest tube. No pneumothorax identified. Right basilar chest tube is again noted. Interval increase opacification overlying both lungs is identified compared with 03/16/2022 which may reflect progressive bilateral pleural effusions, airspace disease and or atelectasis. IMPRESSION: 1. No pneumothorax status post left chest tube removal. 2. Increase opacification of both lungs compared with 03/16/2022 which may reflect progressive bilateral pleural effusions, airspace disease and/or atelectasis. Electronically Signed   By: Kerby Moors M.D.   On: 03/18/2022 05:15   IR EMBO VENOUS NOT HEMORR HEMANG  INC GUIDE ROADMAPPING  Result Date: 04/03/2022 INDICATION: 61 year old female with chronic lymphocytic leukemia and large volume bilateral chylous pleural effusions. She presents for lymphangiogram and thoracic duct embolization. EXAM: IR EMBO VENOUS NOT HEMORR HEMANG INC GUIDE ROADMAPPING; PICC REPLACEMENT UNDER ULTRASOUND; IR RIGHT FLUORO GUIDE CV LINE; IR ULTRASOUND GUIDANCE VASC ACCESS LEFT; LYMPHANGIOGRAM; IR ULTRASOUND GUIDANCE VASC ACCESS RIGHT 1. Ultrasound-guided placement of central venous  catheter via right common femoral vein 2. Ultrasound-guided puncture right superficial inguinal lymph node 3. Ultrasound-guided puncture left superficial inguinal lymph node 4. Bilateral lymphangiogram 5. Balloon occluded embolization of thoracic duct 6. Left upper extremity PICC exchange MEDICATIONS: 2 g cefoxitin. The antibiotic was administered within 1 hour of the procedure ANESTHESIA/SEDATION: General endotracheal anesthesia provided by the anesthesiology service. CONTRAST:  69m OMNIPAQUE IOHEXOL 300 MG/ML  SOLN FLUOROSCOPY: Radiation Exposure Index (as provided by the fluoroscopic device): 4323mGy Kerma COMPLICATIONS: None immediate. PROCEDURE: Informed consent was obtained from the [name] following explanation of the procedure, risks, benefits and alternatives. The [name] understands, agrees and consents for the procedure. All questions were addressed. A time out was performed prior to the initiation of the procedure. Maximal barrier sterile technique utilized including caps, mask, sterile gowns, sterile gloves, large sterile drape, hand hygiene, and Betadine prep. The right common femoral vein was interrogated with ultrasound and found to be widely patent. An image was obtained and stored for the medical record. Local anesthesia was attained by infiltration with 1% lidocaine. A small dermatotomy was made. Under real-time sonographic guidance, the vessel was punctured with a 21 gauge micropuncture needle. Using standard technique, the initial micro needle was exchanged over a 0.018 micro wire for a transitional 4 [place] sheath. The micro sheath was then exchanged over a 0.035 wire for a fascial dilator and the soft tissue tract was dilated. An aero triple-lumen central venous catheter was then advanced over the wire and position with the tip in the right common iliac vein. The catheter flushed  and aspirated easily. The catheter was capped and secured in place. Ultrasound was used to interrogate the  right groin. A large pathologic lymph node with what appears to be a visible lymphatic channel was identified. Under real-time ultrasound guidance, a 25 gauge spinal needle was advanced into the vascular channel. Gentle did injection of contrast under fluoroscopy demonstrates a blob like architecture. This is not consistent with lymphatic flow. Therefore, a smaller and more normal appearing lymph nodes slightly more inferior was identified. Using the same technique, the hilum of the lymph node was punctured with a 25 gauge spinal needle. A gentle injection of Lipiodol under fluoroscopy demonstrates successful filling of lymphatic channels. The needle was secured in place with Tegaderm. Attention was now turned to the contralateral groin. Using the same technique, a lymph node on the left was successfully accessed with ultrasound guidance. Lipiodol injection was performed under fluoroscopy. There was some extension of injected Lipiodol into the central venous system. Therefore, this lymph node was abandoned. A second lymph node was targeted and punctured. This time upon the hiatal injection there is normal opacification of the lymphatic system. Lipiodol was then slowly injected through both accessed superficial inguinal lymph nodes and a diagnostic lymphangiogram was performed. Numerous enlarged pathologic lymph nodes are identified bilaterally along the pelvic side chains and throughout the retroperitoneum. Ultimately, there was coalescence of the lymphatic system into the thoracic duct at the level of L2-L3. Once the thoracic duct was well visualized, angulation was performed to separate the atherosclerotic wall of the aorta from the thoracic duct. Attempts were then made to puncture the duct using a combination of different needles. After several attempts, catheterization was not successful. Therefore, the decision was made to attempt access of the thoracic duct at its insertion into the left subclavian vein. The  [name]'s existing PICC line was cut and removed over a wire. A 5 French sheath was advanced into the left upper arm vein. Attempts were then made to catheterize the insertion of the thoracic duct using a combination of angiographic catheters and micro catheters. After a prolonged period, further attempts at this approach were abandoned. Attention was again turned to the thoracic duct which had re-opacified with Lipiodol. A 21 gauge trocar style needle was employed and used to successfully puncture the duct. A transcend wire was advanced up the duct. An initial attempt to advanced the Progreat catheter into the thoracic duct was not successful. Therefore, a 0.014 CXI catheter was selected and successfully advanced over the wire and into the thoracic duct. The catheter and wire combination were advanced as far into the duct is possible. Ultimately, a Glidewire Advantage wire was parked in the duct and the CXI catheter exchange for a 2.4 [place] Progreat catheter. The Progreat catheter was then navigated more distally into the duct to the level of the lower thoracic spine. Additional lymphadenopathy utilizing direct contrast injection demonstrates a region where the main cystic duct is obliterated by pathologic lymphadenopathy. The branches ram if I into dozens of small thread like branches which then reconstitutes the main thoracic duct. Efforts were made to find a passage through this Maze of small vessels, but this was not successful. At this point, the decision was made to proceed with embolization from the given location. As we had no backstop off coils to prevent embolization through the thoracic duct into the subclavian vein, we decided to place an occlusion balloon in the left subclavian vein. A Bentson wire was advanced through the left arm access into  the right heart. A 12 x 40 mm mustang balloon was advanced over the wire and positioned across the origin of the thoracic duct. The balloon was inflated to full  effacement. True fill liquid embolic glue was then reconstituted in Lipiodol at a concentration of 9:1. The glue mixture was then carefully injected through the Progreat catheter in till there was successful antegrade filling of nearly the entirety of the thoracic duct. The glue was taken up to the level of the aortic arch almost to the inferior aspect of the clavicle. No further injection was performed to prevent reflux into the subclavian vein. The microcatheter was then pulled back and the remaining thoracic duct embolized with glue. The catheter was then withdrawn. After several minutes the subclavian venous balloon was deflated and removed. A new dual lumen power injectable PICC was cut to 43 cm and advanced over the wire and position with the tip at the cavoatrial junction. The catheter flushes and aspirates easily. The catheter was capped and secured to the skin with an adhesive fixation device. IMPRESSION: 1. Successful placement of a right common femoral central venous catheter using ultrasound and fluoroscopic guidance. 2. Successful ultrasound-guided puncture of bilateral superficial inguinal lymph nodes with bilateral abdominopelvic lymphangiogram. 3. Successful liquid embolic embolization of the thoracic duct using true fill glue. 4. Exchange of left upper extremity PICC. Electronically Signed   By: Jacqulynn Cadet M.D.   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 She presents for lymphangiogram and thoracic duct embolization. EXAM: IR EMBO VENOUS NOT HEMORR HEMANG INC GUIDE ROADMAPPING; PICC REPLACEMENT UNDER ULTRASOUND; IR RIGHT FLUORO GUIDE CV LINE; IR ULTRASOUND GUIDANCE VASC ACCESS LEFT; LYMPHANGIOGRAM; IR ULTRASOUND GUIDANCE VASC ACCESS RIGHT 1. Ultrasound-guided placement of central venous catheter via right common femoral vein 2.  Ultrasound-guided puncture right superficial inguinal lymph node 3. Ultrasound-guided puncture left superficial inguinal lymph node 4. Bilateral lymphangiogram 5. Balloon occluded embolization of thoracic duct 6. Left upper extremity PICC exchange MEDICATIONS: 2 g cefoxitin. The antibiotic was administered within 1 hour of the procedure ANESTHESIA/SEDATION: General endotracheal anesthesia provided by the anesthesiology service. CONTRAST:  23m OMNIPAQUE IOHEXOL 300 MG/ML  SOLN FLUOROSCOPY: Radiation Exposure Index (as provided by the fluoroscopic device): 4701mGy Kerma COMPLICATIONS: None immediate. PROCEDURE: Informed consent was obtained from the [name] following explanation of the procedure, risks, benefits and alternatives. The [name] understands, agrees and consents for the procedure. All questions were addressed. A time out was performed prior to the initiation of the procedure. Maximal barrier sterile technique utilized including caps, mask, sterile gowns, sterile gloves, large sterile drape, hand hygiene, and Betadine prep. The right common femoral vein was interrogated with ultrasound and found to be widely patent. An image was obtained and stored for the medical record. Local anesthesia was attained by infiltration with 1% lidocaine. A small dermatotomy was made. Under real-time sonographic guidance, the vessel was punctured with a 21 gauge micropuncture needle. Using standard technique, the initial micro needle was exchanged over a 0.018 micro wire for a transitional 4 [place] sheath. The micro sheath was then exchanged over a 0.035 wire for a fascial dilator and the soft tissue tract was dilated. An aero triple-lumen central venous catheter was then advanced over the wire and position with the tip in the right common iliac vein. The catheter flushed and aspirated easily. The catheter was capped and secured in place.  Ultrasound was used to interrogate the right groin. A large pathologic lymph node  with what appears to be a visible lymphatic channel was identified. Under real-time ultrasound guidance, a 25 gauge spinal needle was advanced into the vascular channel. Gentle did injection of contrast under fluoroscopy demonstrates a blob like architecture. This is not consistent with lymphatic flow. Therefore, a smaller and more normal appearing lymph nodes slightly more inferior was identified. Using the same technique, the hilum of the lymph node was punctured with a 25 gauge spinal needle. A gentle injection of Lipiodol under fluoroscopy demonstrates successful filling of lymphatic channels. The needle was secured in place with Tegaderm. Attention was now turned to the contralateral groin. Using the same technique, a lymph node on the left was successfully accessed with ultrasound guidance. Lipiodol injection was performed under fluoroscopy. There was some extension of injected Lipiodol into the central venous system. Therefore, this lymph node was abandoned. A second lymph node was targeted and punctured. This time upon the hiatal injection there is normal opacification of the lymphatic system. Lipiodol was then slowly injected through both accessed superficial inguinal lymph nodes and a diagnostic lymphangiogram was performed. Numerous enlarged pathologic lymph nodes are identified bilaterally along the pelvic side chains and throughout the retroperitoneum. Ultimately, there was coalescence of the lymphatic system into the thoracic duct at the level of L2-L3. Once the thoracic duct was well visualized, angulation was performed to separate the atherosclerotic wall of the aorta from the thoracic duct. Attempts were then made to puncture the duct using a combination of different needles. After several attempts, catheterization was not successful. Therefore, the decision was made to attempt access of the thoracic duct at its insertion into the left subclavian vein. The [name]'s existing PICC line was cut and  removed over a wire. A 5 French sheath was advanced into the left upper arm vein. Attempts were then made to catheterize the insertion of the thoracic duct using a combination of angiographic catheters and micro catheters. After a prolonged period, further attempts at this approach were abandoned. Attention was again turned to the thoracic duct which had re-opacified with Lipiodol. A 21 gauge trocar style needle was employed and used to successfully puncture the duct. A transcend wire was advanced up the duct. An initial attempt to advanced the Progreat catheter into the thoracic duct was not successful. Therefore, a 0.014 CXI catheter was selected and successfully advanced over the wire and into the thoracic duct. The catheter and wire combination were advanced as far into the duct is possible. Ultimately, a Glidewire Advantage wire was parked in the duct and the CXI catheter exchange for a 2.4 [place] Progreat catheter. The Progreat catheter was then navigated more distally into the duct to the level of the lower thoracic spine. Additional lymphadenopathy utilizing direct contrast injection demonstrates a region where the main cystic duct is obliterated by pathologic lymphadenopathy. The branches ram if I into dozens of small thread like branches which then reconstitutes the main thoracic duct. Efforts were made to find a passage through this Maze of small vessels, but this was not successful. At this point, the decision was made to proceed with embolization from the given location. As we had no backstop off coils to prevent embolization through the thoracic duct into the subclavian vein, we decided to place an occlusion balloon in the left subclavian vein. A Bentson wire was advanced through the left arm access into the right heart. A 12 x 40 mm mustang balloon was  advanced over the wire and positioned across the origin of the thoracic duct. The balloon was inflated to full effacement. True fill liquid embolic  glue was then reconstituted in Lipiodol at a concentration of 9:1. The glue mixture was then carefully injected through the Progreat catheter in till there was successful antegrade filling of nearly the entirety of the thoracic duct. The glue was taken up to the level of the aortic arch almost to the inferior aspect of the clavicle. No further injection was performed to prevent reflux into the subclavian vein. The microcatheter was then pulled back and the remaining thoracic duct embolized with glue. The catheter was then withdrawn. After several minutes the subclavian venous balloon was deflated and removed. A new dual lumen power injectable PICC was cut to 43 cm and advanced over the wire and position with the tip at the cavoatrial junction. The catheter flushes and aspirates easily. The catheter was capped and secured to the skin with an adhesive fixation device. IMPRESSION: 1. Successful placement of a right common femoral central venous catheter using ultrasound and fluoroscopic guidance. 2. Successful ultrasound-guided puncture of bilateral superficial inguinal lymph nodes with bilateral abdominopelvic lymphangiogram. 3. Successful liquid embolic embolization of the thoracic duct using true fill glue. 4. Exchange of left upper extremity PICC. Electronically Signed   By: Jacqulynn Cadet M.D.   On: 04/06/2022 15:47   IR US Guide Vasc Access Left  Result Date: 04/03/2022 INDICATION: 61 year old female with chronic lymphocytic leukemia and large volume bilateral chylous pleural effusions. She presents for lymphangiogram and thoracic duct embolization. EXAM: IR EMBO VENOUS NOT HEMORR HEMANG INC GUIDE ROADMAPPING; PICC REPLACEMENT UNDER ULTRASOUND; IR RIGHT FLUORO GUIDE CV LINE; IR ULTRASOUND GUIDANCE VASC ACCESS LEFT; LYMPHANGIOGRAM; IR ULTRASOUND GUIDANCE VASC ACCESS RIGHT 1. Ultrasound-guided placement of central venous catheter via right common femoral vein 2. Ultrasound-guided puncture right superficial  inguinal lymph node 3. Ultrasound-guided puncture left superficial inguinal lymph node 4. Bilateral lymphangiogram 5. Balloon occluded embolization of thoracic duct 6. Left upper extremity PICC exchange MEDICATIONS: 2 g cefoxitin. The antibiotic was administered within 1 hour of the procedure ANESTHESIA/SEDATION: General endotracheal anesthesia provided by the anesthesiology service. CONTRAST:  12m OMNIPAQUE IOHEXOL 300 MG/ML  SOLN FLUOROSCOPY: Radiation Exposure Index (as provided by the fluoroscopic device): 4983mGy Kerma COMPLICATIONS: None immediate. PROCEDURE: Informed consent was obtained from the [name] following explanation of the procedure, risks, benefits and alternatives. The [name] understands, agrees and consents for the procedure. All questions were addressed. A time out was performed prior to the initiation of the procedure. Maximal barrier sterile technique utilized including caps, mask, sterile gowns, sterile gloves, large sterile drape, hand hygiene, and Betadine prep. The right common femoral vein was interrogated with ultrasound and found to be widely patent. An image was obtained and stored for the medical record. Local anesthesia was attained by infiltration with 1% lidocaine. A small dermatotomy was made. Under real-time sonographic guidance, the vessel was punctured with a 21 gauge micropuncture needle. Using standard technique, the initial micro needle was exchanged over a 0.018 micro wire for a transitional 4 [place] sheath. The micro sheath was then exchanged over a 0.035 wire for a fascial dilator and the soft tissue tract was dilated. An aero triple-lumen central venous catheter was then advanced over the wire and position with the tip in the right common iliac vein. The catheter flushed and aspirated easily. The catheter was capped and secured in place. Ultrasound was used to interrogate the right groin. A large pathologic  lymph node with what appears to be a visible lymphatic  channel was identified. Under real-time ultrasound guidance, a 25 gauge spinal needle was advanced into the vascular channel. Gentle did injection of contrast under fluoroscopy demonstrates a blob like architecture. This is not consistent with lymphatic flow. Therefore, a smaller and more normal appearing lymph nodes slightly more inferior was identified. Using the same technique, the hilum of the lymph node was punctured with a 25 gauge spinal needle. A gentle injection of Lipiodol under fluoroscopy demonstrates successful filling of lymphatic channels. The needle was secured in place with Tegaderm. Attention was now turned to the contralateral groin. Using the same technique, a lymph node on the left was successfully accessed with ultrasound guidance. Lipiodol injection was performed under fluoroscopy. There was some extension of injected Lipiodol into the central venous system. Therefore, this lymph node was abandoned. A second lymph node was targeted and punctured. This time upon the hiatal injection there is normal opacification of the lymphatic system. Lipiodol was then slowly injected through both accessed superficial inguinal lymph nodes and a diagnostic lymphangiogram was performed. Numerous enlarged pathologic lymph nodes are identified bilaterally along the pelvic side chains and throughout the retroperitoneum. Ultimately, there was coalescence of the lymphatic system into the thoracic duct at the level of L2-L3. Once the thoracic duct was well visualized, angulation was performed to separate the atherosclerotic wall of the aorta from the thoracic duct. Attempts were then made to puncture the duct using a combination of different needles. After several attempts, catheterization was not successful. Therefore, the decision was made to attempt access of the thoracic duct at its insertion into the left subclavian vein. The [name]'s existing PICC line was cut and removed over a wire. A 5 French sheath was  advanced into the left upper arm vein. Attempts were then made to catheterize the insertion of the thoracic duct using a combination of angiographic catheters and micro catheters. After a prolonged period, further attempts at this approach were abandoned. Attention was again turned to the thoracic duct which had re-opacified with Lipiodol. A 21 gauge trocar style needle was employed and used to successfully puncture the duct. A transcend wire was advanced up the duct. An initial attempt to advanced the Progreat catheter into the thoracic duct was not successful. Therefore, a 0.014 CXI catheter was selected and successfully advanced over the wire and into the thoracic duct. The catheter and wire combination were advanced as far into the duct is possible. Ultimately, a Glidewire Advantage wire was parked in the duct and the CXI catheter exchange for a 2.4 [place] Progreat catheter. The Progreat catheter was then navigated more distally into the duct to the level of the lower thoracic spine. Additional lymphadenopathy utilizing direct contrast injection demonstrates a region where the main cystic duct is obliterated by pathologic lymphadenopathy. The branches ram if I into dozens of small thread like branches which then reconstitutes the main thoracic duct. Efforts were made to find a passage through this Maze of small vessels, but this was not successful. At this point, the decision was made to proceed with embolization from the given location. As we had no backstop off coils to prevent embolization through the thoracic duct into the subclavian vein, we decided to place an occlusion balloon in the left subclavian vein. A Bentson wire was advanced through the left arm access into the right heart. A 12 x 40 mm mustang balloon was advanced over the wire and positioned across the origin of the  thoracic duct. The balloon was inflated to full effacement. True fill liquid embolic glue was then reconstituted in Lipiodol at a  concentration of 9:1. The glue mixture was then carefully injected through the Progreat catheter in till there was successful antegrade filling of nearly the entirety of the thoracic duct. The glue was taken up to the level of the aortic arch almost to the inferior aspect of the clavicle. No further injection was performed to prevent reflux into the subclavian vein. The microcatheter was then pulled back and the remaining thoracic duct embolized with glue. The catheter was then withdrawn. After several minutes the subclavian venous balloon was deflated and removed. A new dual lumen power injectable PICC was cut to 43 cm and advanced over the wire and position with the tip at the cavoatrial junction. The catheter flushes and aspirates easily. The catheter was capped and secured to the skin with an adhesive fixation device. IMPRESSION: 1. Successful placement of a right common femoral central venous catheter using ultrasound and fluoroscopic guidance. 2. Successful ultrasound-guided puncture of bilateral superficial inguinal lymph nodes with bilateral abdominopelvic lymphangiogram. 3. Successful liquid embolic embolization of the thoracic duct using true fill glue. 4. Exchange of left upper extremity PICC. Electronically Signed   By: Jacqulynn Cadet M.D.   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 She presents for lymphangiogram and thoracic duct embolization. EXAM: IR EMBO VENOUS NOT HEMORR HEMANG INC GUIDE ROADMAPPING; PICC REPLACEMENT UNDER ULTRASOUND; IR RIGHT FLUORO GUIDE CV LINE; IR ULTRASOUND GUIDANCE VASC ACCESS LEFT; LYMPHANGIOGRAM; IR ULTRASOUND GUIDANCE VASC ACCESS RIGHT 1. Ultrasound-guided placement of central venous catheter via right common femoral vein 2. Ultrasound-guided puncture right superficial inguinal lymph node 3. Ultrasound-guided  puncture left superficial inguinal lymph node 4. Bilateral lymphangiogram 5. Balloon occluded embolization of thoracic duct 6. Left upper extremity PICC exchange MEDICATIONS: 2 g cefoxitin. The antibiotic was administered within 1 hour of the procedure ANESTHESIA/SEDATION: General endotracheal anesthesia provided by the anesthesiology service. CONTRAST:  63m OMNIPAQUE IOHEXOL 300 MG/ML  SOLN FLUOROSCOPY: Radiation Exposure Index (as provided by the fluoroscopic device): 4638mGy Kerma COMPLICATIONS: None immediate. PROCEDURE: Informed consent was obtained from the [name] following explanation of the procedure, risks, benefits and alternatives. The [name] understands, agrees and consents for the procedure. All questions were addressed. A time out was performed prior to the initiation of the procedure. Maximal barrier sterile technique utilized including caps, mask, sterile gowns, sterile gloves, large sterile drape, hand hygiene, and Betadine prep. The right common femoral vein was interrogated with ultrasound and found to be widely patent. An image was obtained and stored for the medical record. Local anesthesia was attained by infiltration with 1% lidocaine. A small dermatotomy was made. Under real-time sonographic guidance, the vessel was punctured with a 21 gauge micropuncture needle. Using standard technique, the initial micro needle was exchanged over a 0.018 micro wire for a transitional 4 [place] sheath. The micro sheath was then exchanged over a 0.035 wire for a fascial dilator and the soft tissue tract was dilated. An aero triple-lumen central venous catheter was then advanced over the wire and position with the tip in the right common iliac vein. The catheter flushed and aspirated easily. The catheter was capped and secured in place. Ultrasound was used to interrogate the right groin. A large pathologic lymph node with what appears to be a visible lymphatic channel was identified.  Under real-time  ultrasound guidance, a 25 gauge spinal needle was advanced into the vascular channel. Gentle did injection of contrast under fluoroscopy demonstrates a blob like architecture. This is not consistent with lymphatic flow. Therefore, a smaller and more normal appearing lymph nodes slightly more inferior was identified. Using the same technique, the hilum of the lymph node was punctured with a 25 gauge spinal needle. A gentle injection of Lipiodol under fluoroscopy demonstrates successful filling of lymphatic channels. The needle was secured in place with Tegaderm. Attention was now turned to the contralateral groin. Using the same technique, a lymph node on the left was successfully accessed with ultrasound guidance. Lipiodol injection was performed under fluoroscopy. There was some extension of injected Lipiodol into the central venous system. Therefore, this lymph node was abandoned. A second lymph node was targeted and punctured. This time upon the hiatal injection there is normal opacification of the lymphatic system. Lipiodol was then slowly injected through both accessed superficial inguinal lymph nodes and a diagnostic lymphangiogram was performed. Numerous enlarged pathologic lymph nodes are identified bilaterally along the pelvic side chains and throughout the retroperitoneum. Ultimately, there was coalescence of the lymphatic system into the thoracic duct at the level of L2-L3. Once the thoracic duct was well visualized, angulation was performed to separate the atherosclerotic wall of the aorta from the thoracic duct. Attempts were then made to puncture the duct using a combination of different needles. After several attempts, catheterization was not successful. Therefore, the decision was made to attempt access of the thoracic duct at its insertion into the left subclavian vein. The [name]'s existing PICC line was cut and removed over a wire. A 5 French sheath was advanced into the left upper arm vein.  Attempts were then made to catheterize the insertion of the thoracic duct using a combination of angiographic catheters and micro catheters. After a prolonged period, further attempts at this approach were abandoned. Attention was again turned to the thoracic duct which had re-opacified with Lipiodol. A 21 gauge trocar style needle was employed and used to successfully puncture the duct. A transcend wire was advanced up the duct. An initial attempt to advanced the Progreat catheter into the thoracic duct was not successful. Therefore, a 0.014 CXI catheter was selected and successfully advanced over the wire and into the thoracic duct. The catheter and wire combination were advanced as far into the duct is possible. Ultimately, a Glidewire Advantage wire was parked in the duct and the CXI catheter exchange for a 2.4 [place] Progreat catheter. The Progreat catheter was then navigated more distally into the duct to the level of the lower thoracic spine. Additional lymphadenopathy utilizing direct contrast injection demonstrates a region where the main cystic duct is obliterated by pathologic lymphadenopathy. The branches ram if I into dozens of small thread like branches which then reconstitutes the main thoracic duct. Efforts were made to find a passage through this Maze of small vessels, but this was not successful. At this point, the decision was made to proceed with embolization from the given location. As we had no backstop off coils to prevent embolization through the thoracic duct into the subclavian vein, we decided to place an occlusion balloon in the left subclavian vein. A Bentson wire was advanced through the left arm access into the right heart. A 12 x 40 mm mustang balloon was advanced over the wire and positioned across the origin of the thoracic duct. The balloon was inflated to full effacement. True fill liquid embolic  glue was then reconstituted in Lipiodol at a concentration of 9:1. The glue mixture  was then carefully injected through the Progreat catheter in till there was successful antegrade filling of nearly the entirety of the thoracic duct. The glue was taken up to the level of the aortic arch almost to the inferior aspect of the clavicle. No further injection was performed to prevent reflux into the subclavian vein. The microcatheter was then pulled back and the remaining thoracic duct embolized with glue. The catheter was then withdrawn. After several minutes the subclavian venous balloon was deflated and removed. A new dual lumen power injectable PICC was cut to 43 cm and advanced over the wire and position with the tip at the cavoatrial junction. The catheter flushes and aspirates easily. The catheter was capped and secured to the skin with an adhesive fixation device. IMPRESSION: 1. Successful placement of a right common femoral central venous catheter using ultrasound and fluoroscopic guidance. 2. Successful ultrasound-guided puncture of bilateral superficial inguinal lymph nodes with bilateral abdominopelvic lymphangiogram. 3. Successful liquid embolic embolization of the thoracic duct using true fill glue. 4. Exchange of left upper extremity PICC. Electronically Signed   By: Jacqulynn Cadet M.D.   On: 03/23/2022 15:47   IR PICC REPLACEMENT LEFT INC IMG GUIDE  Result Date: 04/01/2022 INDICATION: 61 year old female with chronic lymphocytic leukemia and large volume bilateral chylous pleural effusions. She presents for lymphangiogram and thoracic duct embolization. EXAM: IR EMBO VENOUS NOT HEMORR HEMANG INC GUIDE ROADMAPPING; PICC REPLACEMENT UNDER ULTRASOUND; IR RIGHT FLUORO GUIDE CV LINE; IR ULTRASOUND GUIDANCE VASC ACCESS LEFT; LYMPHANGIOGRAM; IR ULTRASOUND GUIDANCE VASC ACCESS RIGHT 1. Ultrasound-guided placement of central venous catheter via right common femoral vein 2. Ultrasound-guided puncture right superficial inguinal lymph node 3. Ultrasound-guided puncture left superficial inguinal  lymph node 4. Bilateral lymphangiogram 5. Balloon occluded embolization of thoracic duct 6. Left upper extremity PICC exchange MEDICATIONS: 2 g cefoxitin. The antibiotic was administered within 1 hour of the procedure ANESTHESIA/SEDATION: General endotracheal anesthesia provided by the anesthesiology service. CONTRAST:  25m OMNIPAQUE IOHEXOL 300 MG/ML  SOLN FLUOROSCOPY: Radiation Exposure Index (as provided by the fluoroscopic device): 4726mGy Kerma COMPLICATIONS: None immediate. PROCEDURE: Informed consent was obtained from the [name] following explanation of the procedure, risks, benefits and alternatives. The [name] understands, agrees and consents for the procedure. All questions were addressed. A time out was performed prior to the initiation of the procedure. Maximal barrier sterile technique utilized including caps, mask, sterile gowns, sterile gloves, large sterile drape, hand hygiene, and Betadine prep. The right common femoral vein was interrogated with ultrasound and found to be widely patent. An image was obtained and stored for the medical record. Local anesthesia was attained by infiltration with 1% lidocaine. A small dermatotomy was made. Under real-time sonographic guidance, the vessel was punctured with a 21 gauge micropuncture needle. Using standard technique, the initial micro needle was exchanged over a 0.018 micro wire for a transitional 4 [place] sheath. The micro sheath was then exchanged over a 0.035 wire for a fascial dilator and the soft tissue tract was dilated. An aero triple-lumen central venous catheter was then advanced over the wire and position with the tip in the right common iliac vein. The catheter flushed and aspirated easily. The catheter was capped and secured in place. Ultrasound was used to interrogate the right groin. A large pathologic lymph node with what appears to be a visible lymphatic channel was identified. Under real-time ultrasound guidance, a 25 gauge  spinal needle  was advanced into the vascular channel. Gentle did injection of contrast under fluoroscopy demonstrates a blob like architecture. This is not consistent with lymphatic flow. Therefore, a smaller and more normal appearing lymph nodes slightly more inferior was identified. Using the same technique, the hilum of the lymph node was punctured with a 25 gauge spinal needle. A gentle injection of Lipiodol under fluoroscopy demonstrates successful filling of lymphatic channels. The needle was secured in place with Tegaderm. Attention was now turned to the contralateral groin. Using the same technique, a lymph node on the left was successfully accessed with ultrasound guidance. Lipiodol injection was performed under fluoroscopy. There was some extension of injected Lipiodol into the central venous system. Therefore, this lymph node was abandoned. A second lymph node was targeted and punctured. This time upon the hiatal injection there is normal opacification of the lymphatic system. Lipiodol was then slowly injected through both accessed superficial inguinal lymph nodes and a diagnostic lymphangiogram was performed. Numerous enlarged pathologic lymph nodes are identified bilaterally along the pelvic side chains and throughout the retroperitoneum. Ultimately, there was coalescence of the lymphatic system into the thoracic duct at the level of L2-L3. Once the thoracic duct was well visualized, angulation was performed to separate the atherosclerotic wall of the aorta from the thoracic duct. Attempts were then made to puncture the duct using a combination of different needles. After several attempts, catheterization was not successful. Therefore, the decision was made to attempt access of the thoracic duct at its insertion into the left subclavian vein. The [name]'s existing PICC line was cut and removed over a wire. A 5 French sheath was advanced into the left upper arm vein. Attempts were then made to  catheterize the insertion of the thoracic duct using a combination of angiographic catheters and micro catheters. After a prolonged period, further attempts at this approach were abandoned. Attention was again turned to the thoracic duct which had re-opacified with Lipiodol. A 21 gauge trocar style needle was employed and used to successfully puncture the duct. A transcend wire was advanced up the duct. An initial attempt to advanced the Progreat catheter into the thoracic duct was not successful. Therefore, a 0.014 CXI catheter was selected and successfully advanced over the wire and into the thoracic duct. The catheter and wire combination were advanced as far into the duct is possible. Ultimately, a Glidewire Advantage wire was parked in the duct and the CXI catheter exchange for a 2.4 [place] Progreat catheter. The Progreat catheter was then navigated more distally into the duct to the level of the lower thoracic spine. Additional lymphadenopathy utilizing direct contrast injection demonstrates a region where the main cystic duct is obliterated by pathologic lymphadenopathy. The branches ram if I into dozens of small thread like branches which then reconstitutes the main thoracic duct. Efforts were made to find a passage through this Maze of small vessels, but this was not successful. At this point, the decision was made to proceed with embolization from the given location. As we had no backstop off coils to prevent embolization through the thoracic duct into the subclavian vein, we decided to place an occlusion balloon in the left subclavian vein. A Bentson wire was advanced through the left arm access into the right heart. A 12 x 40 mm mustang balloon was advanced over the wire and positioned across the origin of the thoracic duct. The balloon was inflated to full effacement. True fill liquid embolic glue was then reconstituted in Lipiodol at a concentration of  9:1. The glue mixture was then carefully injected  through the Progreat catheter in till there was successful antegrade filling of nearly the entirety of the thoracic duct. The glue was taken up to the level of the aortic arch almost to the inferior aspect of the clavicle. No further injection was performed to prevent reflux into the subclavian vein. The microcatheter was then pulled back and the remaining thoracic duct embolized with glue. The catheter was then withdrawn. After several minutes the subclavian venous balloon was deflated and removed. A new dual lumen power injectable PICC was cut to 43 cm and advanced over the wire and position with the tip at the cavoatrial junction. The catheter flushes and aspirates easily. The catheter was capped and secured to the skin with an adhesive fixation device. IMPRESSION: 1. Successful placement of a right common femoral central venous catheter using ultrasound and fluoroscopic guidance. 2. Successful ultrasound-guided puncture of bilateral superficial inguinal lymph nodes with bilateral abdominopelvic lymphangiogram. 3. Successful liquid embolic embolization of the thoracic duct using true fill glue. 4. Exchange of left upper extremity PICC. Electronically Signed   By: Jacqulynn Cadet M.D.   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 She presents for lymphangiogram and thoracic duct embolization. EXAM: IR EMBO VENOUS NOT HEMORR HEMANG INC GUIDE ROADMAPPING; PICC REPLACEMENT UNDER ULTRASOUND; IR RIGHT FLUORO GUIDE CV LINE; IR ULTRASOUND GUIDANCE VASC ACCESS LEFT; LYMPHANGIOGRAM; IR ULTRASOUND GUIDANCE VASC ACCESS RIGHT 1. Ultrasound-guided placement of central venous catheter via right common femoral vein 2. Ultrasound-guided puncture right superficial inguinal lymph node 3. Ultrasound-guided puncture left superficial inguinal lymph node 4. Bilateral  lymphangiogram 5. Balloon occluded embolization of thoracic duct 6. Left upper extremity PICC exchange MEDICATIONS: 2 g cefoxitin. The antibiotic was administered within 1 hour of the procedure ANESTHESIA/SEDATION: General endotracheal anesthesia provided by the anesthesiology service. CONTRAST:  28m OMNIPAQUE IOHEXOL 300 MG/ML  SOLN FLUOROSCOPY: Radiation Exposure Index (as provided by the fluoroscopic device): 4631mGy Kerma COMPLICATIONS: None immediate. PROCEDURE: Informed consent was obtained from the [name] following explanation of the procedure, risks, benefits and alternatives. The [name] understands, agrees and consents for the procedure. All questions were addressed. A time out was performed prior to the initiation of the procedure. Maximal barrier sterile technique utilized including caps, mask, sterile gowns, sterile gloves, large sterile drape, hand hygiene, and Betadine prep. The right common femoral vein was interrogated with ultrasound and found to be widely patent. An image was obtained and stored for the medical record. Local anesthesia was attained by infiltration with 1% lidocaine. A small dermatotomy was made. Under real-time sonographic guidance, the vessel was punctured with a 21 gauge micropuncture needle. Using standard technique, the initial micro needle was exchanged over a 0.018 micro wire for a transitional 4 [place] sheath. The micro sheath was then exchanged over a 0.035 wire for a fascial dilator and the soft tissue tract was dilated. An aero triple-lumen central venous catheter was then advanced over the wire and position with the tip in the right common iliac vein. The catheter flushed and aspirated easily. The catheter was capped and secured in place. Ultrasound was used to interrogate the right groin. A large pathologic lymph node with what appears to be a visible lymphatic channel was identified. Under real-time ultrasound guidance, a 25 gauge spinal needle was advanced  into the vascular channel. Gentle did injection of contrast  under fluoroscopy demonstrates a blob like architecture. This is not consistent with lymphatic flow. Therefore, a smaller and more normal appearing lymph nodes slightly more inferior was identified. Using the same technique, the hilum of the lymph node was punctured with a 25 gauge spinal needle. A gentle injection of Lipiodol under fluoroscopy demonstrates successful filling of lymphatic channels. The needle was secured in place with Tegaderm. Attention was now turned to the contralateral groin. Using the same technique, a lymph node on the left was successfully accessed with ultrasound guidance. Lipiodol injection was performed under fluoroscopy. There was some extension of injected Lipiodol into the central venous system. Therefore, this lymph node was abandoned. A second lymph node was targeted and punctured. This time upon the hiatal injection there is normal opacification of the lymphatic system. Lipiodol was then slowly injected through both accessed superficial inguinal lymph nodes and a diagnostic lymphangiogram was performed. Numerous enlarged pathologic lymph nodes are identified bilaterally along the pelvic side chains and throughout the retroperitoneum. Ultimately, there was coalescence of the lymphatic system into the thoracic duct at the level of L2-L3. Once the thoracic duct was well visualized, angulation was performed to separate the atherosclerotic wall of the aorta from the thoracic duct. Attempts were then made to puncture the duct using a combination of different needles. After several attempts, catheterization was not successful. Therefore, the decision was made to attempt access of the thoracic duct at its insertion into the left subclavian vein. The [name]'s existing PICC line was cut and removed over a wire. A 5 French sheath was advanced into the left upper arm vein. Attempts were then made to catheterize the insertion of the  thoracic duct using a combination of angiographic catheters and micro catheters. After a prolonged period, further attempts at this approach were abandoned. Attention was again turned to the thoracic duct which had re-opacified with Lipiodol. A 21 gauge trocar style needle was employed and used to successfully puncture the duct. A transcend wire was advanced up the duct. An initial attempt to advanced the Progreat catheter into the thoracic duct was not successful. Therefore, a 0.014 CXI catheter was selected and successfully advanced over the wire and into the thoracic duct. The catheter and wire combination were advanced as far into the duct is possible. Ultimately, a Glidewire Advantage wire was parked in the duct and the CXI catheter exchange for a 2.4 [place] Progreat catheter. The Progreat catheter was then navigated more distally into the duct to the level of the lower thoracic spine. Additional lymphadenopathy utilizing direct contrast injection demonstrates a region where the main cystic duct is obliterated by pathologic lymphadenopathy. The branches ram if I into dozens of small thread like branches which then reconstitutes the main thoracic duct. Efforts were made to find a passage through this Maze of small vessels, but this was not successful. At this point, the decision was made to proceed with embolization from the given location. As we had no backstop off coils to prevent embolization through the thoracic duct into the subclavian vein, we decided to place an occlusion balloon in the left subclavian vein. A Bentson wire was advanced through the left arm access into the right heart. A 12 x 40 mm mustang balloon was advanced over the wire and positioned across the origin of the thoracic duct. The balloon was inflated to full effacement. True fill liquid embolic glue was then reconstituted in Lipiodol at a concentration of 9:1. The glue mixture was then carefully injected through the Progreat  catheter in  till there was successful antegrade filling of nearly the entirety of the thoracic duct. The glue was taken up to the level of the aortic arch almost to the inferior aspect of the clavicle. No further injection was performed to prevent reflux into the subclavian vein. The microcatheter was then pulled back and the remaining thoracic duct embolized with glue. The catheter was then withdrawn. After several minutes the subclavian venous balloon was deflated and removed. A new dual lumen power injectable PICC was cut to 43 cm and advanced over the wire and position with the tip at the cavoatrial junction. The catheter flushes and aspirates easily. The catheter was capped and secured to the skin with an adhesive fixation device. IMPRESSION: 1. Successful placement of a right common femoral central venous catheter using ultrasound and fluoroscopic guidance. 2. Successful ultrasound-guided puncture of bilateral superficial inguinal lymph nodes with bilateral abdominopelvic lymphangiogram. 3. Successful liquid embolic embolization of the thoracic duct using true fill glue. 4. Exchange of left upper extremity PICC. Electronically Signed   By: Jacqulynn Cadet M.D.   On: 04/08/2022 15:47    Labs:  CBC: Recent Labs    03/16/22 2344 03/21/2022 0352 04/10/2022 1125 03/23/2022 1350 03/19/22 0422 03/20/22 0349  WBC 24.1* 24.4*  --   --  25.7* 20.1*  HGB 10.5* 10.1* 9.5* 8.8* 10.0* 10.1*  HCT 32.9* 31.1* 28.0* 26.0* 31.4* 32.0*  PLT 164 156  --   --  144* 146*     COAGS: Recent Labs    02/16/22 0318 03/16/22 2344 03/16/2022 0352  INR 1.3* 1.0 1.0  APTT 32  --   --      BMP: Recent Labs    03/16/22 2344 03/28/2022 0352 03/19/2022 1125 03/23/2022 1350 03/18/22 0503 03/20/22 0349  NA 133* 134* 136 137 139 139  K 4.6 4.2 3.4* 3.3* 3.7 4.3  CL 98 100 99  --  105 104  CO2 28 29  --   --  27 32  GLUCOSE 169* 193* 86  --  283* 157*  BUN 31* 28* 21  --  21 33*  CALCIUM 7.4* 7.7*  --   --  7.5* 8.1*   CREATININE 0.39* 0.34* <0.20*  --  <0.30* 0.33*  GFRNONAA >60 >60  --   --  NOT CALCULATED >60     LIVER FUNCTION TESTS: Recent Labs    03/16/22 0318 04/07/2022 0352 03/18/22 0503 03/20/22 0349  BILITOT 0.4 0.2* 0.4 0.7  AST 56* 30 30 58*  ALT 91* 56* 42 54*  ALKPHOS 175* 143* 141* 228*  PROT 4.4* 4.4* 5.0* 4.5*  ALBUMIN 1.7* 1.6* 1.9* 1.5*     Assessment and Plan:  [name] awake but reports feeling unwell.  Right chest tube intact with ~710cc of orange red fluid OP Left chest tube intact after placement by Critical Care on 7/4. ~140cc orange-red fluid OP  RN reports [name] request no further interventions.  Please call IR with questions or concerns.  Electronically Signed: Lura Em, PA-C 03/20/2022, 3:45 PM   I spent a total of 15 Minutes at the the [name]'s bedside AND on the [name]'s hospital floor or unit, greater than 50% of which was counseling/coordinating care for status post lymphangiogram with thoracic duct embolization.

## 2022-03-20 NOTE — Plan of Care (Signed)

## 2022-03-20 NOTE — Procedures (Signed)
Pleural Fibrinolytic Administration Procedure Note  [name]  [id number]  1961/01/31  Date:03/20/22  Time:4:23 PM   Provider Performing:Katesha Eichel Jerilynn Mages Verlee Monte   Procedure: Pleural Fibrinolysis Subsequent day [id number])  Indication(s) Fibrinolysis of complicated left pleural effusion  Consent Risks of the procedure as well as the alternatives and risks of each were explained to the patient and/or caregiver.  Consent for the procedure was obtained.   Anesthesia None   Time Out Verified patient identification, verified procedure, site/side was marked, verified correct patient position, special equipment/implants available, medications/allergies/relevant history reviewed, required imaging and test results available.   Sterile Technique Hand hygiene, gloves   Procedure Description Existing pleural catheter was cleaned and accessed in sterile manner.  '10mg'$  of tPA in 30cc of saline and '5mg'$  of dornase in 30cc of sterile water were injected into pleural space using existing pleural catheter.  Catheter will be clamped for 1 hour and then placed back to suction.   Complications/Tolerance None; patient tolerated the procedure well.  EBL None   Specimen(s) None

## 2022-03-20 NOTE — Progress Notes (Signed)
OT Cancellation Note  Patient Details Name: [name] MRN: [id number] DOB: Oct 07, 1960   Cancelled Treatment:    Reason Eval/Treat Not Completed: Pain limiting ability to participate. Patient declines therapy citing pain. Reports she is getting ready to have chest tube injection as well.  [name] L [name] 03/20/2022, 3:02 PM

## 2022-03-21 DIAGNOSIS — G9341 Metabolic encephalopathy: Secondary | ICD-10-CM | POA: Diagnosis not present

## 2022-03-21 DIAGNOSIS — J9601 Acute respiratory failure with hypoxia: Secondary | ICD-10-CM | POA: Diagnosis not present

## 2022-03-21 DIAGNOSIS — A419 Sepsis, unspecified organism: Secondary | ICD-10-CM | POA: Diagnosis not present

## 2022-03-21 DIAGNOSIS — J940 Chylous effusion: Secondary | ICD-10-CM | POA: Diagnosis not present

## 2022-03-21 LAB — GLUCOSE, CAPILLARY
Glucose-Capillary: 146 mg/dL — ABNORMAL HIGH (ref 70–99)
Glucose-Capillary: 157 mg/dL — ABNORMAL HIGH (ref 70–99)
Glucose-Capillary: 177 mg/dL — ABNORMAL HIGH (ref 70–99)
Glucose-Capillary: 198 mg/dL — ABNORMAL HIGH (ref 70–99)
Glucose-Capillary: 246 mg/dL — ABNORMAL HIGH (ref 70–99)

## 2022-03-21 LAB — BPAM RBC
Blood Product Expiration Date: 202308012359
Blood Product Expiration Date: 202308012359
Unit Type and Rh: 5100
Unit Type and Rh: 5100

## 2022-03-21 LAB — CBC WITH DIFFERENTIAL/PLATELET
Abs Immature Granulocytes: 0.06 10*3/uL (ref 0.00–0.07)
Basophils Absolute: 0 10*3/uL (ref 0.0–0.1)
Basophils Relative: 0 %
Eosinophils Absolute: 0 10*3/uL (ref 0.0–0.5)
Eosinophils Relative: 0 %
HCT: 35.8 % — ABNORMAL LOW (ref 36.0–46.0)
Hemoglobin: 11.2 g/dL — ABNORMAL LOW (ref 12.0–15.0)
Immature Granulocytes: 0 %
Lymphocytes Relative: 73 %
Lymphs Abs: 16 10*3/uL — ABNORMAL HIGH (ref 0.7–4.0)
MCH: 29.4 pg (ref 26.0–34.0)
MCHC: 31.3 g/dL (ref 30.0–36.0)
MCV: 94 fL (ref 80.0–100.0)
Monocytes Absolute: 0.2 10*3/uL (ref 0.1–1.0)
Monocytes Relative: 1 %
Neutro Abs: 5.8 10*3/uL (ref 1.7–7.7)
Neutrophils Relative %: 26 %
Platelets: 194 10*3/uL (ref 150–400)
RBC: 3.81 MIL/uL — ABNORMAL LOW (ref 3.87–5.11)
RDW: 19.4 % — ABNORMAL HIGH (ref 11.5–15.5)
WBC: 22.1 10*3/uL — ABNORMAL HIGH (ref 4.0–10.5)
nRBC: 0.2 % (ref 0.0–0.2)

## 2022-03-21 LAB — TYPE AND SCREEN
ABO/RH(D): O POS
Antibody Screen: NEGATIVE
Unit division: 0
Unit division: 0

## 2022-03-21 LAB — POTASSIUM: Potassium: 4.2 mmol/L (ref 3.5–5.1)

## 2022-03-21 LAB — LACTATE DEHYDROGENASE: LDH: 211 U/L — ABNORMAL HIGH (ref 98–192)

## 2022-03-21 MED ORDER — ORAL CARE MOUTH RINSE: 15.0000 mL | OROMUCOSAL | Status: DC | PRN

## 2022-03-21 MED ORDER — TRAVASOL 10 % IV SOLN
INTRAVENOUS | Status: AC
  Filled 2022-03-21: qty 1500

## 2022-03-21 MED ORDER — METOCLOPRAMIDE HCL 5 MG/ML IJ SOLN
5.0000 mg | Freq: Four times a day (QID) | INTRAMUSCULAR | Status: DC
  Administered 2022-03-21 – 2022-03-26 (×19): 5 mg via INTRAVENOUS
  Filled 2022-03-21 (×19): qty 2

## 2022-03-21 NOTE — Progress Notes (Signed)
NAME:  [name], MRN:  [id number], DOB:  04-10-1961, LOS: 36 ADMISSION DATE:  02/26/2022, CONSULTATION DATE:  6/2 REFERRING MD:  Dr. Alvino Chapel, CHIEF COMPLAINT:  Unresponsive; Acute resp failure   BRIEF  Patient is a 61 year old female with pertinent PMH of CLL, depression, anxiety, HLD, HTN presents to [facility] ED on 6/2 unresponsive.  On 6/2 patient called EMS for abdominal pain.  Patient initially was alert and talking but became unresponsive with AMS.  Patient was hypoxic with agonal breathing and was placed on NRB.  Patient was noted to be hypotensive.  Patient transported to [facility] ED.  Upon arrival to [facility] ED patient remained unresponsive with agonal respirations.  Patient intubated for airway protection and hypoxia.  BP initially 81/65.  Given IV fluids.  Started on levo.  Fever 100.9 F and WBC 104.4.  Started on cefepime, Flagyl, Vanco.  Cultures pending.  Glucose 198, BNP 534, troponin 109 then 112, LA 4.4 then 1.8, UA unremarkable, UDS negative.  CXR with large left pleural effusion and small right pleural effusion.  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  CT abdomen with ascending colitis constipation, small intestine wall thickening possibly related to gastroenteritis; small volume ascites; extensive abdominopelvic and inguinal adenopathy.   PCCM was consulted for bilateral pleural effusion  Pertinent  Medical History    has a past medical history of Anxiety, Chronic abdominal pain, Chronic lymphocytic leukemia (CLL), B-cell ([place]) (04/04/2016), CLL (chronic lymphocytic leukemia) ([place]), Depression with anxiety (07/07/2015), Family history of hyperlipidemia (12/14/2019), H. 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   has a past surgical history that includes Surgery of left middle finger on left hand (childhood ); Tubal ligation; Cholecystectomy; Colonoscopy (02/07/2011); Hammer toe surgery (Left); Axillary lymph node biopsy (Right, 03/28/2016); Hysteroscopy with D & C (N/A, 07/30/2016); Thoracentesis (Left, 03/14/2022); Chest tube insertion (N/A, 03/06/2022); IR EMBO VENOUS NOT HEMORR HEMANG  INC GUIDE ROADMAPPING (04/13/2022); IR US Guide Vasc Access Right (03/30/2022); IR US Guide Vasc Access Left (03/24/2022); IR LYMPHANGIOGRAM PEL/ABD BILAT (04/07/2022); IR Fluoro Guide CV Line Right (03/28/2022); and Radiology with anesthesia (N/A, 03/15/2022).  Significant Hospital Events: Including procedures, antibiotic start and stop dates in addition to other pertinent events   6/2 Presented [facility] ER unresponsive, intubated, shock on pressors > to [facility].  CT chest/abd w/ extensive LAN 6/3 Right chest tube placed. Left thora 2000 ml removed - milky 6/4 Extubated, off pressors.  Pleural fluid exudative, cx negative, lymphocytic. 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 Started on pulse dose steroids '50mg'$  pred.  6/7 Chest tube output over a liter 6/8 Left thoracentesis for 1400 cc of fluid 6/10 Chest tube removed 6/12 S/p right chest tube placement with 3.1 L drained 6/3 Tx from [facility] to [facility] 6/13 for initiation of palliative radiotherapy 6/14 No chest pain. Saturation 96% on 2 L. Chest tube drained 190 cc of milky fluid 6/15 Fell. CXR stable. Doing well. Got SIM for XRT.  RA.  Mod-large effusion on L. Refused CT. Octreotide. 6/16 TPN.  On [place] O2 . R CT 1000 cc out.  Started XRT 6/15, rituximab is being considered 6/17 CT issues with placement. Not sure if she wants new chest tube.  6/18 Dislodged right chest tube completely.  6/19 PleurX deferred due to poor candidate outpatient. Bilateral chest tubes placed due to worsening respiratory distress.  6/20 - new onset hypotension after bilateral  chest tube and midodrine started 6/21 2.8L out from CT in last 24 hours. IR consulted for thoracic duct embolization. Per IR: Lymphangiogram with thoracic duct embolization is not a routine procedure and will need to see when this procedure could potentially be performed 6/24-continues with significant output from chest tubes 6/25 - Afebrile .decreased chest tube output 6/26 - walked with PT in my presene.Bilateral chest tubes with draining chylothorax. IR uncertain (12/07/38) being STARTED SIROLIMUS  6/27- Last 90 min > 50cc drain of chyle on left sidde. Rt side - hardly any ouput.  Growing consensus that thoracic duct ligation is indicated based on curbside with Dr Kipp Brood surgeon and also running case by surgeon in [place], Dr Lorenso Courier of oncology. Started on sirolimus yesterday, She is eating, ambulating and also on TPN. No fevers D/wDr McCullohgh of IR - he has experience with TDE and will plan for procedure at [facility] or [facility] next few to several day. Needs General and a 6h block of time Patient agreeable for  TDE RT chest tube not draining well - new minor fissure fluid collection 6/28 -overnight dyspneic . Now on 3L [place]. LEft chst tube is leaking. Rt chest tube not much drain. TPN ongoing. Good appetie. IR considering TDE 03/25/2022 - patient aware 6/29 TPN stopped, doing calorie count. CT CAP with large bl effusions and tubes in ok position 6/30 tPA in left chest tube with good output 7/1 restarted TPN - not meeting caloric needs and wish to give her every shot possible at pleurodesis following TDE on 7/3. tPA in right chest tube 7/3 underwent TDE. Encephalopathic and increased WOB after procedure, placed on HHFNC. Dislodged left chest tube. 7/4 left chest tube replaced  7/5 2nd dose of tPA to right chest tube 7/6 3rd dose of tPA to right chest tube, 2nd dose of tPA to left chest tube  Interim History / Subjective:   Abdominal pain, distention. Last night was very uncomfortable. Started on pain  and anxiety medications and this is improved somewhat.   Objective   Blood pressure (!) 129/95, pulse (!) 115, temperature 97.8 F (36.6 C), temperature source Oral, resp. rate 18, height '5\' 5"'$  (1.651 m), weight 54.4 kg, last menstrual period 07/25/2016, SpO2 97 %.        Intake/Output Summary (Last 24 hours) at 03/21/2022 1040 Last data filed at 03/21/2022 0900 Gross per 24 hour  Intake 3262.39 ml  Output 3525 ml  Net -262.61 ml   Filed Weights   03/18/22 1228 03/21/22 0425  Weight: 53.5 kg 54.4 kg    I/O last 3 completed shifts: In: 4675.5 [P.O.:530; I.V.:3942.7; IV Piggyback:142.7] Out: 27 [Urine:2150; Chest Tube:2675]  Intake/Output      07/06 0701 07/07 0700 07/07 0701 07/08 0700   P.O. 530    I.V. (mL/kg) 2988.9 (54.9) 200 (3.7)   IV Piggyback 100 50   Chest Tube 40    Total Intake(mL/kg) 3658.9 (67.3) 250 (4.6)   Urine (mL/kg/hr) 1400 (1.1)    Chest Tube 2125    Total Output 3525    Net +133.9 +250         Physical exam: Thin chronically ill appearing Bilateral chest tubes in placed Tachypnic breath sounds diminished bilateral No edema Abdominal distension with ascites, soft   Chest tubes with bilateral output 300 and 200 overnight, but over a liter on  each side in 24 hours.   CT chest with ongoing pleural effusions. Abdominal ascites. 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 right axillary node was biopsies on 02/18/22 showing B cells consistent with her history of lymphoma and CLL. She is s/p TDE 7/3 with IR. - continue octreotide 02/25/22 -  - restart TPN since 02/25/22 - 03/13/22, 7/1- - continue prednisone '50mg'$  per day since 02/18/22 -  - on sirolimus since 03/10/22 - midodrine as below - continues to have high output in chest tubes despite  thoracic duct ligation and TPN. I think pleurodesis unlikely to be effective with this degree of output. Also associated with significant pain. I suspect we have reached the limits of what can be offered medically for her.  - o2 for goa pulse ox > 94%  Hypotension  - In setting of chronic disease, profound malnutrition but onset 6/20 after chest tube -continue midodrine   Severe protein calorie malnutrition  - back on TPN    Best Practice:  Goals of care: Palliative care reengaged 7/4. I had a conversation with the patient, palliative care and the patient's son Gerald Stabs today. I suspect she is reaching the limits   I spent 50 minutes in total visit time for this patient, with more than 50% spent counseling/coordinating care.  Lenice Llamas, MD Pulmonary and [place] 03/21/2022 10:45 AM Pager: see AMION  If no response to pager, please call critical care on call (see AMION) until 7pm After 7:00 pm call Elink

## 2022-03-21 NOTE — Progress Notes (Signed)
Occupational Therapy Discharge Patient Details Name: [name] MRN: [id number] DOB: 1961-04-20 Today's Date: 03/21/2022 Time:  -     Patient discharged from OT services secondary to medical decline - will need to re-order OT to resume therapy services. Palliative care involved and appears plan is to transition to comfort care. OT will sign off.   Please see latest therapy progress note for current level of functioning and progress toward goals.    Progress and discharge plan discussed with patient and/or caregiver: N?A     [name] L Edgardo Petrenko 03/21/2022, 10:18 AM

## 2022-03-21 NOTE — Progress Notes (Signed)
Progress Note   Patient: [name] YQM:[id number] DOB: 13-Mar-1961 DOA: 02/16/2022     35 DOS: the patient was seen and examined on 03/21/2022 at 11:05AM      Brief hospital course: [name] is a 61 y.o. F with CLL and HTN who presented to  [facility] on 6/2 with increased work of breathing.  In the ER, respiratory status worsened, she became hypoxic, altered and was intubated.  CT imaging showed bilateral pleural effusions and bulky diffuse lymphadenopathy, and she was transferred to [facility].      6/2: Admitted, intubated in ER, in shock, transferred to [facility] ICU on pressors 6/3: Right chest tube placed. 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  6/7-8: Chest tube continues to have high output  6/10: Chest tube removed 6/12: Repeat right chest tube placement with 3.1 L drained 6/13: Transfer from [facility] to [facility] for initiation of radiotherapy 6/15: Output from chest tube remains high, octreotide started, XRT started 6/16: Started TPN  6/18: Dislodged right chest tube completely 6/19: PleurX considered but deferred due to poor candidate outpatient. Bilateral chest tubes placed due to worsening respiratory distress.  6/20: midodrine started 6/21: Growing consensus that thoracic duct embolization is required; CT surgery and IR involved in discussions for this procedure 6/22-6/24: Continues with significant output from chest tubes 6/27: Oncology started sirolimus for CLL 6/28-6/29: Chest tube output less 6/30: Lytics instilled in chest tube and ~2L output 7/3: Underwent Thoracic duct embolization by Dr. Laurence Fry 7/4: Very debilitated and still high output from chest tubes after TDE; Palliative Care met with family and patient and established plan for time limited trial  7/6: Pleural fibronlytic repeated, CT showed reduced  size of effusions; tentative plan for Pleurodesis 7/7: Chest tube output remains too high for pleurodesis      Assessment and Plan: * Bilateral chylothorax S/p TDE by Dr. Laurence Fry on 7/3 Chest tube output still very high, 1L out of each tube in the last 24 hours  - Continue prednisone and sirolimus - Continue octreotide - Continue fat restricted diet and TPN to reduce lipid absorption/chyle  - Trend CBC given chyle loss  Acute respiratory failure with hypoxia ([place]) Due to bilateral chylothorax. 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  Underwent core needle biopsy on 6/6. This and flow cytometry consistent with her known CLL.  Started on prednisone and later sirolimus. - Continue prednisone and sirolimus   Hyperkalemia    Pressure injury of skin    Protein-calorie malnutrition, severe Due to nuritional loss from chyle leak. - Continue TPN  Chronic diastolic heart failure (HCC) Appears close to euvolemic  Septic shock (HCC) Resolved  CLL (chronic lymphocytic leukemia) (HCC) - Continue sirolimus and prednisone  Essential hypertension BP soft - Continue midodrine          Subjective: Patient is tired, her chest tube hurts.  The pain in her lower abdomen which has been present off and on for several weeks is somewhat less right now than earlier this morning.  No fever, no confusion     Physical Exam: Vitals:   03/21/22  0700 03/21/22 0800 03/21/22 0849 03/21/22 0900  BP: (!) 132/99 113/90  (!) 129/95  Pulse: (!) 109 (!) 111 (!) 111 (!) 115  Resp: $Remo'20 16 17 18  'gtvfK$ Temp:  97.8 F (36.6 C)    TempSrc:  Oral    SpO2: 98% 97% 97% 97%  Weight:      Height:       Early female, sitting  up in bed, interactive Tachycardic, regular, no murmurs, no peripheral edema Respiratory rate normal, upper lung sounds clear without rales or wheezes, diminished at the bases, reduced air movement Abdomen soft no tenderness palpation or guarding, no ascites or distention She is attentive to conversation, she is somewhat weak, oriented to person, place, and time, face symmetric, speech fluent, moves upper extremities with generalized weakness but symmetric strength    Data Reviewed: Potassium 4.2 LDH 211, stable White blood cell count 22K, differential normal, hemoglobin normal, platelets normal       Disposition: Status is: Inpatient         Author: Edwin Dada, MD 03/21/2022 11:47 AM  For on call review [email or web address].

## 2022-03-21 NOTE — Progress Notes (Signed)
Palliative Care Progress Note  Follow-up visit with [name] today after having extensive goals of care conversation with her family on 7/4.  She has had a very long difficult hospitalization related to the diagnosed CLL with recurrent persistent chylothorax and bulky adenopathy.  Plan of care outlined previously was that patient will receive a full course of radiation followed by chemotherapy to treat the CLL however her care has been complicated by the need for complex chest tube management for recurrent chylothorax large volume, she has persistently had a falling albumin which is now 1.6 with high output from her chest tubes.  Chest tubes frequently dislodge and become clogged due to the components of the chylous effusion.  Attempt was made at thoracic duct embolization earlier this week plan we outlined to give this a couple of days to see if it was effective we were able to reduce the volume of her pleural effusions.  Family and patient were clear if this did not make a significant impact that they would likely elect to transition to comfort care, [name] is increasingly weak and motional he exhausted-she is expressed that she may be reaching her limit of how much she can tolerate and while she tells me that she wants to fight this is much as possible and wants to get better she does not think that she can continue to endure additional chest tubes and procedures.   Today during my visit the patient complains of severe pain she is distressed and also clearly expressed to me that she "cannot go on" -she believes that she is dying and that we should not continue if she is not getting better" She was tearful. She has abdominal swelling and pain-may be shunting of chylous fluid to abdomen.  Discussed with CCM. Would be reasonable to transition to comfort care at this point.  Provide pain and symptom relief. Started hydromorphone 0.'5mg'$  q2 prn, Ativan for agitation.  Will follow up tomorrow with family and  after decision is made re: de-escalation.  Lane Hacker, DO Palliative Medicine   Time: 50 minutes

## 2022-03-21 NOTE — Progress Notes (Signed)
Palliative Care Progress Note  61 year old with newly diagnosed CLL with complications related to recurrent chylothorax, chest tube dysfunction, recurrent respiratory failure, malnutrition, ascites and functional status decline.  Prolonged hospitalization. Previously established plan to have radiation to mediastinal adenopathy and steroid induction followed by initiation of chemotherapy once stable from this hospitalization.  The challenges have been to manage her recurrent chylothorax -[name] has endured multiple chest tube placements, she has required frequent tPA to the chest tubes, TPN, octreotide and most recently underwent thoracic duct embolization.  See meeting note from 7/4.  Plan was to give several days to see if the embolization intervention reduce the amount of pleural fluid and would allow for pleurodesis and control of her effusions, plan was discussed and developed in collaboration with pulmonary CCM.   Yesterday, while she had a reduction in the amount of chest tube output, appeared to be developing additional symptoms that included development of peritoneal fluid and abdominal distention with possible ileus.  When I saw her she was having a significant amount of pain and severe nausea, provided pain medication and antinausea medication with a plan to follow-up again today with her and her family.  I have seen and evaluated [name] today and have contacted her son, her daughter-in-law and her daughter by phone and had a conference call at the bedside.  The conversation centered around [name]'s goals of care in terms of her current treatment options which include potential for a pleurodesis provided her chest tube output amenable to that intervention-pulmonary confirms that it is not possible to do this procedure given the amount of output currently but they will monitor this on a day-to-day basis.  As to decision making seems to be specifically centered on what to do about additional  chest tubes and interventions for the chylothorax.  I shared with the family my concerns about the overall picture prognosis that she is facing.  We discussed her tolerance for different levels of suffering, with little assurance that her outcomes will be favorable or she will actually be able to receive chemotherapy in the future which is the definitive treatment for CLL, without this treatment and without the ability to receive cancer treatment, (meaning that she is free of chest tubes, infection and nutrition has improved as well as her functional status and strength to receive the treatment course), otherwise, this is a terminal illness. I reiterated the purpose and intent of all of the interventions we were doing was just to get her to a point where she could get treatment for her CLL and that other than the radiation and steroids, none of the procedures are actually treating her cancer-the longer the complications go on and the effusions continue to be challenging to manage or  unmanageable, the less likely it is she will survive or be able to get treatment for her CLL.   Summary of Goals and Recommendations:  Maintain current level of support, intervention, treatment and aggressive management of chest tubes and chylothorax. Continue to monitor chest tube output-family and patient were offered pleurodesis procedure if output continues to decrease.At this time procedure is not possible but will make decisions on a day to day basis. Notably decisions and opinions regarding her care have been widely varying depending on the conditions in the moment both with the patient and her family as well as the multiple providers involved in her care. For [name], her dyspnea, pain, nausea, emotional and mental coping all have been factors in her own choices  about what she will have done and refuses to have done-her outcome is very uncertain and we all hope for the best, however the longer this continues the less  likely it is that she will recover. Family expressed concern that patient was being influenced in terms of her choices (specifically they did not like that I suggested that she was "tired"-I explained that I have personally witnessed both her agreeing to treatment and intervention and also refusing it and being at a point of suffering and exhaustion and she has specifically and unprompted told me she was tired and wanted no more procedures). Today, [name] clearly told me that she does not want to die but if it is her time she is accepting that and would not want to go suffering or go through this is there wasn't a chance she could get better and recover.She has a strong faith and took the opportunity to comfort her children on the call and tell them that she loved them and to just take it minute by minute and god would work it out. She also hopes to see them on Saturday. Family plan on being at hospital on Saturday- I told them we would touch base at that time-I also attempted to reassure them that my intent was to help and support and that there was no other agenda for myself or the hospital other than making sure we are providing the best care possible for an extremely rare condition and that we are considering the overall big picture of what is most important to [name] and what she is willing to go through  -while I may offer medical opinion I hope to influence informed shared decision making between the patient, her family and the medical team. Will maintain all current pain, dyspnea and symptom management medications. She was severely nauseated during my visit and had vomiting -Will add scheduled reglan q6 and she has prn phenergan compazine. She is on a bowel regimen but at high risk for developing an ileus with cancer progression and adenopathy in her abdomen.  Updated Dr. Shearon Stalls, CCM following my call with patient and family, I let her know that family had very specific questions about the  pleurodesis procedure, chest tube output and management.   Lane Hacker, DO Palliative Medicine   Time: 100 minutes

## 2022-03-21 NOTE — Progress Notes (Signed)
No need of bipap at this time. Pt is resting well no resp distress noted.

## 2022-03-21 NOTE — Progress Notes (Signed)
Physical Therapy Discharge Patient Details Name: [name] MRN: [id number] DOB: 08-Apr-1961 Today's Date: 03/21/2022 Time:  -     Patient discharged from PT services secondary to medical decline -Palliative Care following for [place].  Please see latest therapy progress note for current level of functioning and progress toward goals.    Progress and discharge plan discussed with patient and/or caregiver: NA  GP    [place] Office [phone] Weekend [phone]  Claretha Cooper 03/21/2022, 8:49 AM

## 2022-03-21 NOTE — Progress Notes (Signed)
PHARMACY - TOTAL PARENTERAL NUTRITION CONSULT NOTE  Indication:  bilateral chylothorax with high chest tube output  Patient Measurements: Height: 5\' 5"  (165.1 cm) Weight: 54.4 kg (119 lb 14.9 oz) IBW/kg (Calculated) : 57 TPN AdjBW (KG): 64 Body mass index is 19.96 kg/m. Weight 64kg on admit and now 48.7 kg  Assessment:  56 YOF with pertinent PMH of CLL, depression, anxiety, HLD, HTN presents to [facility] ED on 6/2 unresponsive.  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 Patient also has bilateral chylothorax with high output. Pharmacy has been consulted for TPN.  Glucose / Insulin: new onset DM2 in addition to steroids, A1c 6.5% CBGs elevated Used 29 units SSI in past 24hr, Semglee 8/d Remains on prednisone 50mg  daily, sirolimus 2mg  daily Electrolytes: improved and WNL, K stable overnight Renal: SCr < 1 Hepatic: alk phos elevated at 228, tbili and TG WNL, albumin 1.5 Intake / Output: UOP 1700 mL, CT O/P 2125 mL, LBM 7/5 GI Surgeries / Procedures:  7/3 thoracic duct embolization   Central access: PICC placed 02/27/22, replaced 7/3 TPN start date: 6/13-6/29, resumed 7/1   Nutritional Goals:  RD Estimated Needs Total Energy Estimated Needs: 2300-2600 Total Protein Estimated Needs: 140-160 grams Total Fluid Estimated Needs: >2.5 L  Current Nutrition:  TPN Low fat diet, no more than 7g fat per meal  Boost Breeze po TID (each: 250 kCal and 9g AA) Ensure MAX Protein daily (each: 150 kCal and 30g AA)  Plan:  Continue TPN at goal rate of 100 ml/hr Electrolytes in TPN: Na 80 mEq/L, K 10 mEq/L, Ca 5 mEq/L, Mg 5 mEq/L, Phos 12.5 mmol/L, Cl:Ac 1:1 Continue daily multivitamin PO supplement Continue resistant SSI and Semglee 8 units SQ daily TPN labs on Mon/Thurs.   Emiliano Dyer, PharmD, [place] Clinical Pharmacist Pharmacy: [phone] 03/21/2022 7:15 AM

## 2022-03-22 DIAGNOSIS — J940 Chylous effusion: Secondary | ICD-10-CM | POA: Diagnosis not present

## 2022-03-22 DIAGNOSIS — C9110 Chronic lymphocytic leukemia of B-cell type not having achieved remission: Secondary | ICD-10-CM | POA: Diagnosis not present

## 2022-03-22 DIAGNOSIS — G893 Neoplasm related pain (acute) (chronic): Secondary | ICD-10-CM | POA: Diagnosis not present

## 2022-03-22 LAB — CBC WITH DIFFERENTIAL/PLATELET
Abs Immature Granulocytes: 0.05 10*3/uL (ref 0.00–0.07)
Basophils Absolute: 0 10*3/uL (ref 0.0–0.1)
Basophils Relative: 0 %
Eosinophils Absolute: 0 10*3/uL (ref 0.0–0.5)
Eosinophils Relative: 0 %
HCT: 32.2 % — ABNORMAL LOW (ref 36.0–46.0)
Hemoglobin: 10.5 g/dL — ABNORMAL LOW (ref 12.0–15.0)
Immature Granulocytes: 0 %
Lymphocytes Relative: 70 %
Lymphs Abs: 15.5 10*3/uL — ABNORMAL HIGH (ref 0.7–4.0)
MCH: 30.7 pg (ref 26.0–34.0)
MCHC: 32.6 g/dL (ref 30.0–36.0)
MCV: 94.2 fL (ref 80.0–100.0)
Monocytes Absolute: 0.1 10*3/uL (ref 0.1–1.0)
Monocytes Relative: 0 %
Neutro Abs: 6.8 10*3/uL (ref 1.7–7.7)
Neutrophils Relative %: 30 %
Platelets: 174 10*3/uL (ref 150–400)
RBC: 3.42 MIL/uL — ABNORMAL LOW (ref 3.87–5.11)
RDW: 19.7 % — ABNORMAL HIGH (ref 11.5–15.5)
WBC: 22.5 10*3/uL — ABNORMAL HIGH (ref 4.0–10.5)
nRBC: 0 % (ref 0.0–0.2)

## 2022-03-22 LAB — COMPREHENSIVE METABOLIC PANEL
ALT: 83 U/L — ABNORMAL HIGH (ref 0–44)
AST: 74 U/L — ABNORMAL HIGH (ref 15–41)
Albumin: <1.5 g/dL — ABNORMAL LOW (ref 3.5–5.0)
Alkaline Phosphatase: 378 U/L — ABNORMAL HIGH (ref 38–126)
Anion gap: 5 (ref 5–15)
BUN: 44 mg/dL — ABNORMAL HIGH (ref 8–23)
CO2: 28 mmol/L (ref 22–32)
Calcium: 8 mg/dL — ABNORMAL LOW (ref 8.9–10.3)
Chloride: 107 mmol/L (ref 98–111)
Creatinine, Ser: <0.3 mg/dL — ABNORMAL LOW (ref 0.44–1.00)
Glucose, Bld: 195 mg/dL — ABNORMAL HIGH (ref 70–99)
Potassium: 4.2 mmol/L (ref 3.5–5.1)
Sodium: 140 mmol/L (ref 135–145)
Total Bilirubin: 0.9 mg/dL (ref 0.3–1.2)
Total Protein: 4.3 g/dL — ABNORMAL LOW (ref 6.5–8.1)

## 2022-03-22 LAB — GLUCOSE, CAPILLARY
Glucose-Capillary: 191 mg/dL — ABNORMAL HIGH (ref 70–99)
Glucose-Capillary: 166 mg/dL — ABNORMAL HIGH (ref 70–99)
Glucose-Capillary: 178 mg/dL — ABNORMAL HIGH (ref 70–99)
Glucose-Capillary: 179 mg/dL — ABNORMAL HIGH (ref 70–99)
Glucose-Capillary: 167 mg/dL — ABNORMAL HIGH (ref 70–99)
Glucose-Capillary: 168 mg/dL — ABNORMAL HIGH (ref 70–99)

## 2022-03-22 LAB — MAGNESIUM: Magnesium: 2 mg/dL (ref 1.7–2.4)

## 2022-03-22 LAB — LACTATE DEHYDROGENASE: LDH: 232 U/L — ABNORMAL HIGH (ref 98–192)

## 2022-03-22 LAB — PHOSPHORUS: Phosphorus: 3.3 mg/dL (ref 2.5–4.6)

## 2022-03-22 MED ORDER — LORAZEPAM 2 MG/ML IJ SOLN
1.0000 mg | Freq: Once | INTRAMUSCULAR | Status: AC
  Administered 2022-03-22: 1 mg via INTRAVENOUS
  Filled 2022-03-22: qty 1

## 2022-03-22 MED ORDER — LIDOCAINE 5 % EX PTCH
2.0000 | MEDICATED_PATCH | CUTANEOUS | Status: DC
Start: 2022-03-22 — End: 2022-03-26
  Administered 2022-03-22 – 2022-03-25 (×4): 2 via TRANSDERMAL
  Filled 2022-03-22 (×4): qty 2

## 2022-03-22 MED ORDER — TALC (STERITALC) POWDER FOR INTRAPLEURAL USE
4.0000 g | Freq: Once | INTRAVENOUS | Status: AC
  Administered 2022-03-22: 4 g via INTRAPLEURAL
  Filled 2022-03-22: qty 4

## 2022-03-22 MED ORDER — TRAVASOL 10 % IV SOLN
INTRAVENOUS | Status: AC
  Filled 2022-03-22: qty 1500

## 2022-03-22 MED ORDER — LIDOCAINE HCL 1 % IJ SOLN
10.0000 mL | Freq: Once | INTRAMUSCULAR | Status: AC
  Administered 2022-03-22: 10 mL via INTRAPLEURAL
  Filled 2022-03-22: qty 20

## 2022-03-22 MED ORDER — HYDROMORPHONE HCL 1 MG/ML IJ SOLN
1.0000 mg | INTRAMUSCULAR | Status: DC | PRN
  Administered 2022-03-23 – 2022-03-26 (×14): 1 mg via INTRAVENOUS
  Filled 2022-03-22 (×15): qty 1

## 2022-03-22 MED ORDER — SODIUM CHLORIDE 0.9 % IV SOLN
0.2000 mg/kg/h | INTRAVENOUS | Status: DC
  Administered 2022-03-22: 0.5 mg/kg/h via INTRAVENOUS
  Filled 2022-03-22: qty 10

## 2022-03-22 MED ORDER — KETAMINE BOLUS VIA INFUSION: 0.1000 mg/kg | INTRAVENOUS | Status: DC | PRN

## 2022-03-22 NOTE — Plan of Care (Signed)
  Problem: Coping: Goal: Level of anxiety will decrease Outcome: Progressing   Problem: Elimination: Goal: Will not experience complications related to urinary retention Outcome: Progressing   Problem: Safety: Goal: Ability to remain free from injury will improve Outcome: Progressing   Problem: Nutrition: Goal: Adequate nutrition will be maintained Outcome: Not Progressing   Problem: Pain Managment: Goal: General experience of comfort will improve Outcome: Not Progressing   Problem: Skin Integrity: Goal: Risk for impaired skin integrity will decrease Outcome: Not Progressing

## 2022-03-22 NOTE — Procedures (Signed)
Chest Tube Pleurodesis Note  TYNESHIA STIVERS  [id number]  [birth year]  Date:03/22/22  Time:1:17 PM   Provider Performing:Akiel Fennell Chauncey Cruel Shearon Stalls   Procedure: Chemical Pleurodesis via Chest Tube (32560)  Indication(s) Induced scarring of pleural space  Consent Risks of the procedure as well as the alternatives and risks of each were explained to the patient and/or caregiver.  Consent for the procedure was obtained.   Anesthesia 20cc 1% Lidocaine injected in with pleurodesis agents Ketamine and ativan also used   Time Out Verified patient identification, verified procedure, site/side was marked, verified correct patient position, special equipment/implants available, medications/allergies/relevant history reviewed, required imaging and test results available.   Sterile Technique Hand hygiene, gloves   Procedure Description Existing pleural catheter was cleaned and accessed in sterile manner.  Talc 4g in 100cc saline slurry administered into pleural space.  Followed by 50 cc normal saline. The chest tube will be clamped and patient will rotate positions for 3 hour then chest tube will be placed to suction.   Complications/Tolerance None; patient tolerated the procedure well.   EBL None   Specimen(s) None

## 2022-03-22 NOTE — Progress Notes (Signed)
NAME:  [name], MRN:  [id number], DOB:  July 23, 1961, LOS: 53 ADMISSION DATE:  03/08/2022, CONSULTATION DATE:  6/2 REFERRING MD:  Dr. Alvino Chapel, CHIEF COMPLAINT:  Unresponsive; Acute resp failure   BRIEF  Patient is a 61 year old female with pertinent PMH of CLL, depression, anxiety, HLD, HTN presents to [facility] ED on 6/2 unresponsive.  On 6/2 patient called EMS for abdominal pain.  Patient initially was alert and talking but became unresponsive with AMS.  Patient was hypoxic with agonal breathing and was placed on NRB.  Patient was noted to be hypotensive.  Patient transported to [facility] ED.  Upon arrival to [facility] ED patient remained unresponsive with agonal respirations.  Patient intubated for airway protection and hypoxia.  BP initially 81/65.  Given IV fluids.  Started on levo.  Fever 100.9 F and WBC 104.4.  Started on cefepime, Flagyl, Vanco.  Cultures pending.  Glucose 198, BNP 534, troponin 109 then 112, LA 4.4 then 1.8, UA unremarkable, UDS negative.  CXR with large left pleural effusion and small right pleural effusion.  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  CT abdomen with ascending colitis constipation, small intestine wall thickening possibly related to gastroenteritis; small volume ascites; extensive abdominopelvic and inguinal adenopathy.   PCCM was consulted for bilateral pleural effusion  Pertinent  Medical History    has a past medical history of Anxiety, Chronic abdominal pain, Chronic lymphocytic leukemia (CLL), B-cell ([place]) (04/04/2016), CLL (chronic lymphocytic leukemia) ([place]), Depression with anxiety (07/07/2015), Family history of hyperlipidemia (12/14/2019), H. 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   has a past surgical history that includes Surgery of left middle finger on left hand (childhood ); Tubal ligation; Cholecystectomy; Colonoscopy (02/07/2011); Hammer toe surgery (Left); Axillary lymph node biopsy (Right, 03/28/2016); Hysteroscopy with D & C (N/A, 07/30/2016); Thoracentesis (Left, 02/13/2022); Chest tube insertion (N/A, 03/04/2022); IR EMBO VENOUS NOT HEMORR HEMANG  INC GUIDE ROADMAPPING (03/25/2022); IR US Guide Vasc Access Right (04/02/2022); IR US Guide Vasc Access Left (04/07/2022); IR LYMPHANGIOGRAM PEL/ABD BILAT (03/23/2022); IR Fluoro Guide CV Line Right (04/11/2022); and Radiology with anesthesia (N/A, 03/18/2022).  Significant Hospital Events: Including procedures, antibiotic start and stop dates in addition to other pertinent events   6/2 Presented [facility] ER unresponsive, intubated, shock on pressors > to [facility].  CT chest/abd w/ extensive LAN 6/3 Right chest tube placed. Left thora 2000 ml removed - milky 6/4 Extubated, off pressors.  Pleural fluid exudative, cx negative, lymphocytic. 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 Started on pulse dose steroids '50mg'$  pred.  6/7 Chest tube output over a liter 6/8 Left thoracentesis for 1400 cc of fluid 6/10 Chest tube removed 6/12 S/p right chest tube placement with 3.1 L drained 6/3 Tx from [facility] to [facility] 6/13 for initiation of palliative radiotherapy 6/14 No chest pain. Saturation 96% on 2 L. Chest tube drained 190 cc of milky fluid 6/15 Fell. CXR stable. Doing well. Got SIM for XRT.  RA.  Mod-large effusion on L. Refused CT. Octreotide. 6/16 TPN.  On [place] O2 . R CT 1000 cc out.  Started XRT 6/15, rituximab is being considered 6/17 CT issues with placement. Not sure if she wants new chest tube.  6/18 Dislodged right chest tube completely.  6/19 PleurX deferred due to poor candidate outpatient. Bilateral chest tubes placed due to worsening respiratory distress.  6/20 - new onset hypotension after bilateral  chest tube and midodrine started 6/21 2.8L out from CT in last 24 hours. IR consulted for thoracic duct embolization. Per IR: Lymphangiogram with thoracic duct embolization is not a routine procedure and will need to see when this procedure could potentially be performed 6/24-continues with significant output from chest tubes 6/25 - Afebrile .decreased chest tube output 6/26 - walked with PT in my presene.Bilateral chest tubes with draining chylothorax. IR uncertain (5/95/63) being STARTED SIROLIMUS  6/27- Last 90 min > 50cc drain of chyle on left sidde. Rt side - hardly any ouput.  Growing consensus that thoracic duct ligation is indicated based on curbside with Dr Kipp Brood surgeon and also running case by surgeon in [place], Dr Lorenso Courier of oncology. Started on sirolimus yesterday, She is eating, ambulating and also on TPN. No fevers D/wDr McCullohgh of IR - he has experience with TDE and will plan for procedure at [facility] or [facility] next few to several day. Needs General and a 6h block of time Patient agreeable for  TDE RT chest tube not draining well - new minor fissure fluid collection 6/28 -overnight dyspneic . Now on 3L [place]. LEft chst tube is leaking. Rt chest tube not much drain. TPN ongoing. Good appetie. IR considering TDE 03/18/2022 - patient aware 6/29 TPN stopped, doing calorie count. CT CAP with large bl effusions and tubes in ok position 6/30 tPA in left chest tube with good output 7/1 restarted TPN - not meeting caloric needs and wish to give her every shot possible at pleurodesis following TDE on 7/3. tPA in right chest tube 7/3 underwent TDE. Encephalopathic and increased WOB after procedure, placed on HHFNC. Dislodged left chest tube. 7/4 left chest tube replaced  7/5 2nd dose of tPA to right chest tube 7/6 3rd dose of tPA to right chest tube, 2nd dose of tPA to left chest tube  Interim History / Subjective:  Chest tube output has decreased to less than 200cc on each side.  Breathing  improved.  Feels that her abdominal discomfort has improved from 10 to 7.   Objective   Blood pressure 124/80, pulse (!) 112, temperature 98.2 F (36.8 C), temperature source Oral, resp. rate (!) 29, height '5\' 5"'$  (1.651 m), weight 52 kg, last menstrual period 07/25/2016, SpO2 93 %.        Intake/Output Summary (Last 24 hours) at 03/22/2022 1000 Last data filed at 03/22/2022 0630 Gross per 24 hour  Intake 1551.29 ml  Output 1675 ml  Net -123.71 ml   Filed Weights   03/21/22 0425 03/22/22 0447  Weight: 54.4 kg 52 kg    I/O last 3 completed shifts: In: 3512.2 [P.O.:240; I.V.:2932.1; IV Piggyback:300] Out: 2935 [Urine:2150; Chest Tube:785]  Intake/Output      07/07 0701 07/08 0700 07/08 0701 07/09 0700   P.O.     I.V. (mL/kg) 1681.3 (32.3)    IV Piggyback 200    Chest Tube 20    Total Intake(mL/kg) 1901.4 (36.6)    Urine (mL/kg/hr) 1500 (1.2)    Chest Tube 245    Total Output 1745    Net +156.4          Physical exam: Thin, chronically ill appearing No increased wob Breath sounds diminished bilateral bases.  Abdominal distension, but abdomen soft.   Less than 200 cc on each side output in the last 24  hours, serous drainage Na 140 K 4.2 Alb< 1.5 WBC 22. 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 right axillary node was biopsies on 02/18/22 showing B cells consistent with her history of lymphoma and CLL. She is s/p TDE 7/3 with IR. - continue octreotide 02/25/22 -  - restart TPN since 02/25/22 - 03/13/22, 7/1- - continue prednisone '50mg'$  per day since 02/18/22 -  - on sirolimus since 03/10/22 - midodrine as below - output has decreased to less than 200cc per chest tube in the last 24 hours. I think this is the window for talc pleurodesis. Did conference call with patient,  bedside nurse, son and DIL. All agreeable to proceed with talc pleurodesis attempt.  - discussed with pharmacy and palliative care. Will plan for optimal pain control and continuous ketamine infusion. Plan for bedside procedure when ready.  - o2 for goa pulse ox > 94%  Hypotension  - In setting of chronic disease, profound malnutrition but onset 6/20 after chest tube -continue midodrine   Severe protein calorie malnutrition  - back on TPN  Pain control/symptom management Appreciate palliative care assistance.     I spent 60 minutes in total visit time for this patient, with more than 50% spent counseling/coordinating care. Irrespective of procedures.    Lenice Llamas, MD Pulmonary and [place] 03/22/2022 10:00 AM Pager: see AMION  If no response to pager, please call critical care on call (see AMION) until 7pm After 7:00 pm call Elink

## 2022-03-22 NOTE — Procedures (Signed)
Chest Tube Pleurodesis Note  [name]  [id number]  March 15, 1961  Date:03/22/22  Time:1:07 PM   Provider Performing:Maudene Stotler Chauncey Cruel Shearon Stalls   Procedure: Chemical Pleurodesis via Chest Tube (32560)  Indication(s) Induced scarring of pleural space, left  Consent Risks of the procedure as well as the alternatives and risks of each were explained to the patient and/or caregiver.  Consent for the procedure was obtained.   Anesthesia 20cc 1% Lidocaine injected in prior to pleurodesis agents. Ketamine infusion and ativan administered.   Time Out Verified patient identification, verified procedure, site/side was marked, verified correct patient position, special equipment/implants available, medications/allergies/relevant history reviewed, required imaging and test results available.   Sterile Technique Hand hygiene, gloves   Procedure Description Existing pleural catheter was cleaned and accessed in sterile manner.  Talc 4g in 100cc saline slurry administered into pleural space.  Followed by 50cc sterile saline. The chest tube will be clamped and patient will rotate positions for 3 hour then chest tube will be placed to suction.   Complications/Tolerance None; patient tolerated the procedure well.   EBL None   Specimen(s) None

## 2022-03-22 NOTE — Progress Notes (Signed)
Palliative Care Progress Note  Plan today is for bedside pleurodesis with talc. Decreased output from chest tubes. CCM has discussed with family and patient and will proceed with procedure. Request for pain and symptom management assistance. Will anticipate the possibility of significant discomfort given hypersensitivity of the chest tube site and previous difficulty with pain during placement. Will initiate a ketamine infusion at 0.'5mg'$ /kg/hr with a 0.'1mg'$ /kg bolus dose. Will give Ativan X1 prior to procedure for her comfort. Will also place lidoderm patch on chest wall. Will maintain high level of pain control for at least 24 hours following procedure.  Discussed with patient and her daughter [name] who is at bedside. I also completed FMLA paperwork for her daughter.  Recommendations for in procedure pain and symptom management discussed in detail with Dr. Shearon Stalls and RN.  Lori Hacker, DO Palliative Medicine   Time; 50 minutes

## 2022-03-22 NOTE — Progress Notes (Signed)
No need of bipap. Pt resting well.

## 2022-03-22 NOTE — Progress Notes (Signed)
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 Weight 64kg on admit and now 48.7 kg  Assessment:  76 YOF with pertinent PMH of CLL, depression, anxiety, HLD, HTN presents to [facility] ED on 6/2 unresponsive.  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 Patient also has bilateral chylothorax with high output. Pharmacy has been consulted for TPN.  Glucose / Insulin: new onset DM2 in addition to steroids, A1c 6.5% - CBGs elevated but overall improved - Used 22 units SSI in past 24hr, Semglee 8/d - Remains on prednisone 58m daily, sirolimus 244mdaily Electrolytes: improved and WNL, K stabilized Renal: SCr < 1, BUN rising Hepatic: alk phos elevated and rising, tbili and TG WNL, albumin <1.5 Intake / Output: UOP 1500 mL, CT O/P 245 mL documented, LBM 7/5 GI Surgeries / Procedures:  7/3 thoracic duct embolization   Central access: PICC placed 02/27/22, replaced 7/3 TPN start date: 6/13-6/29, resumed 7/1   Nutritional Goals:  RD Estimated Needs Total Energy Estimated Needs: 2300-2600 Total Protein Estimated Needs: 140-160 grams Total Fluid Estimated Needs: >2.5 L  Current Nutrition:  TPN Low fat diet, no more than 7g fat per meal  Boost Breeze po TID (each: 250 kCal and 9g AA) Ensure MAX Protein daily (each: 150 kCal and 30g AA)  Plan:  Continue TPN at goal rate of 100 ml/hr Electrolytes in TPN: Na 80 mEq/L, K 10 mEq/L, Ca 5 mEq/L, Mg 5 mEq/L, Phos 12.5 mmol/L, Cl:Ac 1:1 Continue daily multivitamin PO supplement Continue resistant SSI and Semglee 8 units SQ daily TPN labs on Mon/Thurs.   WiEmiliano DyerPharmD, BCCoffeelinical Pharmacist Pharmacy: [phone]/04/2022 8:38 AM

## 2022-03-23 ENCOUNTER — Inpatient Hospital Stay (HOSPITAL_COMMUNITY): Payer: Medicaid Other

## 2022-03-23 DIAGNOSIS — J940 Chylous effusion: Secondary | ICD-10-CM | POA: Diagnosis not present

## 2022-03-23 LAB — GLUCOSE, CAPILLARY
Glucose-Capillary: 177 mg/dL — ABNORMAL HIGH (ref 70–99)
Glucose-Capillary: 159 mg/dL — ABNORMAL HIGH (ref 70–99)
Glucose-Capillary: 165 mg/dL — ABNORMAL HIGH (ref 70–99)
Glucose-Capillary: 134 mg/dL — ABNORMAL HIGH (ref 70–99)
Glucose-Capillary: 113 mg/dL — ABNORMAL HIGH (ref 70–99)
Glucose-Capillary: 129 mg/dL — ABNORMAL HIGH (ref 70–99)

## 2022-03-23 LAB — CBC WITH DIFFERENTIAL/PLATELET
Abs Immature Granulocytes: 0.01 10*3/uL (ref 0.00–0.07)
Basophils Absolute: 0 10*3/uL (ref 0.0–0.1)
Basophils Relative: 0 %
Eosinophils Absolute: 0 10*3/uL (ref 0.0–0.5)
Eosinophils Relative: 0 %
HCT: 31.2 % — ABNORMAL LOW (ref 36.0–46.0)
Hemoglobin: 10 g/dL — ABNORMAL LOW (ref 12.0–15.0)
Immature Granulocytes: 0 %
Lymphocytes Relative: 81 %
Lymphs Abs: 16.3 10*3/uL — ABNORMAL HIGH (ref 0.7–4.0)
MCH: 29.5 pg (ref 26.0–34.0)
MCHC: 32.1 g/dL (ref 30.0–36.0)
MCV: 92 fL (ref 80.0–100.0)
Monocytes Absolute: 0.1 10*3/uL (ref 0.1–1.0)
Monocytes Relative: 0 %
Neutro Abs: 3.9 10*3/uL (ref 1.7–7.7)
Neutrophils Relative %: 19 %
Platelets: 174 10*3/uL (ref 150–400)
RBC: 3.39 MIL/uL — ABNORMAL LOW (ref 3.87–5.11)
RDW: 19.8 % — ABNORMAL HIGH (ref 11.5–15.5)
WBC: 20.3 10*3/uL — ABNORMAL HIGH (ref 4.0–10.5)
nRBC: 0 % (ref 0.0–0.2)

## 2022-03-23 LAB — LACTATE DEHYDROGENASE: LDH: 258 U/L — ABNORMAL HIGH (ref 98–192)

## 2022-03-23 MED ORDER — TRAVASOL 10 % IV SOLN
INTRAVENOUS | Status: AC
  Filled 2022-03-23: qty 1500

## 2022-03-23 MED ORDER — INSULIN GLARGINE-YFGN 100 UNIT/ML ~~LOC~~ SOLN
10.0000 [IU] | Freq: Every day | SUBCUTANEOUS | Status: DC
  Administered 2022-03-23 – 2022-03-24 (×2): 10 [IU] via SUBCUTANEOUS
  Filled 2022-03-23 (×3): qty 0.1

## 2022-03-23 NOTE — Progress Notes (Signed)
PHARMACY - TOTAL PARENTERAL NUTRITION CONSULT NOTE  Indication:  bilateral chylothorax with high chest tube output  Patient Measurements: Height: 5\' 5"  (165.1 cm) Weight: 52 kg (114 lb 10.2 oz) IBW/kg (Calculated) : 57 TPN AdjBW (KG): 64 Body mass index is 19.08 kg/m. Weight 64kg on admit and now 48.7 kg  Assessment:  32 YOF with pertinent PMH of CLL, depression, anxiety, HLD, HTN presents to [facility] ED on 6/2 unresponsive.  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 Patient also has bilateral chylothorax with high output. Pharmacy has been consulted for TPN.  Glucose / Insulin: new onset DM2 in addition to steroids, A1c 6.5% - CBGs elevated but overall improved - Used 24 units SSI in past 24hr, Semglee 8/d - Remains on prednisone 50mg  daily, sirolimus 2mg  daily Electrolytes: Labs 7/8: improved and WNL, K stabilized Renal: SCr < 1, BUN rising Hepatic: alk phos elevated and rising, tbili and TG WNL, albumin <1.5 Intake / Output: UOP 1450 mL, CT O/P 110 mL s/p talc pleurodesis 7/8, LBM 7/5 GI Surgeries / Procedures:  7/3 thoracic duct embolization   Central access: PICC placed 02/27/22, replaced 7/3 TPN start date: 6/13-6/29, resumed 7/1   Nutritional Goals:  RD Estimated Needs Total Energy Estimated Needs: 2300-2600 Total Protein Estimated Needs: 140-160 grams Total Fluid Estimated Needs: >2.5 L  Current Nutrition:  TPN Low fat diet, no more than 7g fat per meal  Boost Breeze po TID (each: 250 kCal and 9g AA) - refused 7/8 Ensure MAX Protein daily (each: 150 kCal and 30g AA)  Plan:  Continue TPN at goal rate of 100 ml/hr Electrolytes in TPN: Na 80 mEq/L, K 10 mEq/L, Ca 5 mEq/L, Mg 5 mEq/L, Phos 12.5 mmol/L, Cl:Ac 1:1 Continue daily multivitamin PO supplement Continue resistant SSI and increase Semglee 10 units SQ daily TPN labs on Mon/Thurs.   Emiliano Dyer, PharmD, [place] Clinical Pharmacist Pharmacy: [phone] 03/23/2022 6:59 AM

## 2022-03-23 NOTE — Progress Notes (Signed)
NAME:  [name], MRN:  [id number], DOB:  31-Jan-1961, LOS: 73 ADMISSION DATE:  03/03/2022, CONSULTATION DATE:  6/2 REFERRING MD:  Dr. Alvino Chapel, CHIEF COMPLAINT:  Unresponsive; Acute resp failure   BRIEF  Patient is a 61 year old female with pertinent PMH of CLL, depression, anxiety, HLD, HTN presents to [facility] ED on 6/2 unresponsive.  On 6/2 patient called EMS for abdominal pain.  Patient initially was alert and talking but became unresponsive with AMS.  Patient was hypoxic with agonal breathing and was placed on NRB.  Patient was noted to be hypotensive.  Patient transported to [facility] ED.  Upon arrival to [facility] ED patient remained unresponsive with agonal respirations.  Patient intubated for airway protection and hypoxia.  BP initially 81/65.  Given IV fluids.  Started on levo.  Fever 100.9 F and WBC 104.4.  Started on cefepime, Flagyl, Vanco.  Cultures pending.  Glucose 198, BNP 534, troponin 109 then 112, LA 4.4 then 1.8, UA unremarkable, UDS negative.  CXR with large left pleural effusion and small right pleural effusion.  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  CT abdomen with ascending colitis constipation, small intestine wall thickening possibly related to gastroenteritis; small volume ascites; extensive abdominopelvic and inguinal adenopathy.   PCCM was consulted for bilateral pleural effusion  Pertinent  Medical History    has a past medical history of Anxiety, Chronic abdominal pain, Chronic lymphocytic leukemia (CLL), B-cell ([name]) (04/04/2016), CLL (chronic lymphocytic leukemia) ([name]), Depression with anxiety (07/07/2015), Family history of hyperlipidemia (12/14/2019), H. 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   has a past surgical history that includes Surgery of left middle finger on left hand (childhood ); Tubal ligation; Cholecystectomy; Colonoscopy (02/07/2011); Hammer toe surgery (Left); Axillary lymph node biopsy (Right, 03/28/2016); Hysteroscopy with D & C (N/A, 07/30/2016); Thoracentesis (Left, 02/25/2022); Chest tube insertion (N/A, 02/23/2022); IR EMBO VENOUS NOT HEMORR HEMANG  INC GUIDE ROADMAPPING (03/29/2022); IR US Guide Vasc Access Right (03/20/2022); IR US Guide Vasc Access Left (03/21/2022); IR LYMPHANGIOGRAM PEL/ABD BILAT (03/30/2022); IR Fluoro Guide CV Line Right (03/31/2022); and Radiology with anesthesia (N/A, 03/16/2022).  Significant Hospital Events: Including procedures, antibiotic start and stop dates in addition to other pertinent events   6/2 Presented [facility] ER unresponsive, intubated, shock on pressors > to [facility].  CT chest/abd w/ extensive LAN 6/3 Right chest tube placed. Left thora 2000 ml removed - milky 6/4 Extubated, off pressors.  Pleural fluid exudative, cx negative, lymphocytic. 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 Started on pulse dose steroids '50mg'$  pred.  6/7 Chest tube output over a liter 6/8 Left thoracentesis for 1400 cc of fluid 6/10 Chest tube removed 6/12 S/p right chest tube placement with 3.1 L drained 6/3 Tx from [facility] to [facility] 6/13 for initiation of palliative radiotherapy 6/14 No chest pain. Saturation 96% on 2 L. Chest tube drained 190 cc of milky fluid 6/15 Fell. CXR stable. Doing well. Got SIM for XRT.  RA.  Mod-large effusion on L. Refused CT. Octreotide. 6/16 TPN.  On  O2 . R CT 1000 cc out.  Started XRT 6/15, rituximab is being considered 6/17 CT issues with placement. Not sure if she wants new chest tube.  6/18 Dislodged right chest tube completely.  6/19 PleurX deferred due to poor candidate outpatient. Bilateral chest tubes placed due to worsening respiratory distress.  6/20 - new onset hypotension after bilateral  chest tube and midodrine started 6/21 2.8L out from CT in last 24 hours. IR consulted for thoracic duct embolization. Per IR: Lymphangiogram with thoracic duct embolization is not a routine procedure and will need to see when this procedure could potentially be performed 6/24-continues with significant output from chest tubes 6/25 - Afebrile .decreased chest tube output 6/26 - walked with PT in my presene.Bilateral chest tubes with draining chylothorax. IR uncertain (12/23/79) being STARTED SIROLIMUS  6/27- Last 90 min > 50cc drain of chyle on left sidde. Rt side - hardly any ouput.  Growing consensus that thoracic duct ligation is indicated based on curbside with Dr Kipp Brood surgeon and also running case by surgeon in [place], Dr Lorenso Courier of oncology. Started on sirolimus yesterday, She is eating, ambulating and also on TPN. No fevers D/wDr McCullohgh of IR - he has experience with TDE and will plan for procedure at [facility] or [facility] next few to several day. Needs General and a 6h block of time Patient agreeable for  TDE RT chest tube not draining well - new minor fissure fluid collection 6/28 -overnight dyspneic . Now on 3L [place]. LEft chst tube is leaking. Rt chest tube not much drain. TPN ongoing. Good appetie. IR considering TDE 04/11/2022 - patient aware 6/29 TPN stopped, doing calorie count. CT CAP with large bl effusions and tubes in ok position 6/30 tPA in left chest tube with good output 7/1 restarted TPN - not meeting caloric needs and wish to give her every shot possible at pleurodesis following TDE on 7/3. tPA in right chest tube 7/3 underwent TDE. Encephalopathic and increased WOB after procedure, placed on HHFNC. Dislodged left chest tube. 7/4 left chest tube replaced  7/5 2nd dose of tPA to right chest tube 7/6 3rd dose of tPA to right chest tube, 2nd dose of tPA to left chest tube 7/8 talc pleurodesis  Interim History / Subjective:  Tolerated talc pleurodesis well yesterday. Was on ketamine  overnight for pain. Now turned off, pain controlled. Complaining of abdominal bloating and fullness.   Objective   Blood pressure 111/73, pulse (!) 124, temperature 98.2 F (36.8 C), temperature source Oral, resp. rate (!) 31, height '5\' 5"'$  (1.651 m), weight 52 kg, last menstrual period 07/25/2016, SpO2 95 %.        Intake/Output Summary (Last 24 hours) at 03/23/2022 0944 Last data filed at 03/23/2022 0600 Gross per 24 hour  Intake 1779.7 ml  Output 1560 ml  Net 219.7 ml   Filed Weights   03/21/22 0425 03/22/22 0447  Weight: 54.4 kg 52 kg    I/O last 3 completed shifts: In: 3478.9 [I.V.:3280.1; IV Piggyback:198.8] Out: 2695 [Urine:2550; Chest Tube:145]  Intake/Output      07/08 0701 07/09 0700 07/09 0701 07/10 0700   I.V. (mL/kg) 1898.6 (36.5)    IV Piggyback 98.8    Chest Tube     Total Intake(mL/kg) 1997.4 (38.4)    Urine (mL/kg/hr) 1450 (1.2)    Chest Tube 110    Total Output 1560    Net +437.4         Urine Occurrence 1 x     Physical exam: Thin, chronically ill appearing Tachypnic, on nasal cannula, able to speak in full sentences Tachycardic, regular Abdomen is distended but soft.  Extremities thin, no edema Awake, alert, oriented  Chest tube output  minimal from right side, <100 from left side.   Chest xray pending this morning.  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 right axillary node was biopsies on 02/18/22 showing B cells consistent with her history of lymphoma and CLL. She is s/p TDE 7/3 with IR. - continue octreotide 02/25/22 -  - restart TPN since 02/25/22 - 03/13/22, 7/1- - continue prednisone '50mg'$  per day since 02/18/22 -  - on sirolimus since 03/10/22 - o2 for goa pulse ox > 94% - 7/8 had bilateral talc pleurodesis. Chest tube output  now minimal. After reviewing chest xray today will likely d/c chest tubes.  - will perform bedside ultrasound. I am concerned some of her chylous output is being shunted to her abdomen instead of pleural space.   Hypotension  - In setting of chronic disease, profound malnutrition but onset 6/20 after chest tube -continue midodrine   Severe protein calorie malnutrition  - back on TPN  Pain control/symptom management Appreciate palliative care assistance.   I spent 35 minutes in total visit time for this patient, with more than 50% spent counseling/coordinating care.   Lenice Llamas, MD Pulmonary and [place] 03/23/2022 9:48 AM Pager: see AMION  If no response to pager, please call critical care on call (see AMION) until 7pm After 7:00 pm call Elink

## 2022-03-23 NOTE — Plan of Care (Signed)
  Problem: Nutritional: Goal: Maintenance of adequate nutrition will improve Outcome: Not Progressing   

## 2022-03-23 NOTE — Progress Notes (Signed)
86m's of Ketamine wasted with CJeanine LuzRN.

## 2022-03-23 NOTE — Progress Notes (Signed)
eLink Physician-Brief Progress Note Patient Name: [name] DOB: 28-Sep-1960 MRN: [id number]   Date of Service  03/23/2022  HPI/Events of Note  Notified of dislodged R sided chest tube from earlier at shift change.   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  Pt with minimal chest tube output.   Plan was to remove chest tubes in the morning.   eICU Interventions  Get CXR now.      Intervention Category Intermediate Interventions: Other:  [name] 03/23/2022, 10:30 PM

## 2022-03-24 ENCOUNTER — Encounter (HOSPITAL_COMMUNITY): Payer: Self-pay | Admitting: Adult Health

## 2022-03-24 DIAGNOSIS — J940 Chylous effusion: Secondary | ICD-10-CM | POA: Diagnosis not present

## 2022-03-24 LAB — COMPREHENSIVE METABOLIC PANEL
ALT: 49 U/L — ABNORMAL HIGH (ref 0–44)
AST: 43 U/L — ABNORMAL HIGH (ref 15–41)
Albumin: <1.5 g/dL — ABNORMAL LOW (ref 3.5–5.0)
Alkaline Phosphatase: 398 U/L — ABNORMAL HIGH (ref 38–126)
Anion gap: 4 — ABNORMAL LOW (ref 5–15)
BUN: 40 mg/dL — ABNORMAL HIGH (ref 8–23)
CO2: 27 mmol/L (ref 22–32)
Calcium: 8.3 mg/dL — ABNORMAL LOW (ref 8.9–10.3)
Chloride: 115 mmol/L — ABNORMAL HIGH (ref 98–111)
Creatinine, Ser: 0.39 mg/dL — ABNORMAL LOW (ref 0.44–1.00)
GFR, Estimated: >60 mL/min (ref >60)
Glucose, Bld: 232 mg/dL — ABNORMAL HIGH (ref 70–99)
Potassium: 4 mmol/L (ref 3.5–5.1)
Sodium: 146 mmol/L — ABNORMAL HIGH (ref 135–145)
Total Bilirubin: 0.9 mg/dL (ref 0.3–1.2)
Total Protein: 4.4 g/dL — ABNORMAL LOW (ref 6.5–8.1)

## 2022-03-24 LAB — CBC WITH DIFFERENTIAL/PLATELET
Abs Immature Granulocytes: 0.06 10*3/uL (ref 0.00–0.07)
Basophils Absolute: 0.1 10*3/uL (ref 0.0–0.1)
Basophils Relative: 1 %
Eosinophils Absolute: 0 10*3/uL (ref 0.0–0.5)
Eosinophils Relative: 0 %
HCT: 32.1 % — ABNORMAL LOW (ref 36.0–46.0)
Hemoglobin: 10.3 g/dL — ABNORMAL LOW (ref 12.0–15.0)
Immature Granulocytes: 0 %
Lymphocytes Relative: 75 %
Lymphs Abs: 14.5 10*3/uL — ABNORMAL HIGH (ref 0.7–4.0)
MCH: 29.2 pg (ref 26.0–34.0)
MCHC: 32.1 g/dL (ref 30.0–36.0)
MCV: 90.9 fL (ref 80.0–100.0)
Monocytes Absolute: 0.1 10*3/uL (ref 0.1–1.0)
Monocytes Relative: 0 %
Neutro Abs: 4.8 10*3/uL (ref 1.7–7.7)
Neutrophils Relative %: 24 %
Platelets: 143 10*3/uL — ABNORMAL LOW (ref 150–400)
RBC: 3.53 MIL/uL — ABNORMAL LOW (ref 3.87–5.11)
RDW: 19.8 % — ABNORMAL HIGH (ref 11.5–15.5)
WBC: 19.5 10*3/uL — ABNORMAL HIGH (ref 4.0–10.5)
nRBC: 0 % (ref 0.0–0.2)

## 2022-03-24 LAB — GLUCOSE, CAPILLARY
Glucose-Capillary: 122 mg/dL — ABNORMAL HIGH (ref 70–99)
Glucose-Capillary: 132 mg/dL — ABNORMAL HIGH (ref 70–99)
Glucose-Capillary: 148 mg/dL — ABNORMAL HIGH (ref 70–99)
Glucose-Capillary: 150 mg/dL — ABNORMAL HIGH (ref 70–99)
Glucose-Capillary: 171 mg/dL — ABNORMAL HIGH (ref 70–99)
Glucose-Capillary: 202 mg/dL — ABNORMAL HIGH (ref 70–99)
Glucose-Capillary: 212 mg/dL — ABNORMAL HIGH (ref 70–99)
Glucose-Capillary: 216 mg/dL — ABNORMAL HIGH (ref 70–99)
Glucose-Capillary: 237 mg/dL — ABNORMAL HIGH (ref 70–99)

## 2022-03-24 LAB — MAGNESIUM: Magnesium: 2.2 mg/dL (ref 1.7–2.4)

## 2022-03-24 LAB — LACTATE DEHYDROGENASE: LDH: 266 U/L — ABNORMAL HIGH (ref 98–192)

## 2022-03-24 LAB — CHOLESTEROL, TOTAL: Cholesterol: 104 mg/dL (ref 0–200)

## 2022-03-24 LAB — PHOSPHORUS: Phosphorus: 3.4 mg/dL (ref 2.5–4.6)

## 2022-03-24 LAB — TRIGLYCERIDES: Triglycerides: 59 mg/dL (ref <150)

## 2022-03-24 MED ORDER — TRAVASOL 10 % IV SOLN
INTRAVENOUS | Status: AC
  Filled 2022-03-24: qty 1500

## 2022-03-24 NOTE — Progress Notes (Signed)
NAME:  [name], MRN:  [id number], DOB:  10-30-1960, LOS: 47 ADMISSION DATE:  02/19/2022, CONSULTATION DATE:  6/2 REFERRING MD:  Dr. Alvino Chapel, CHIEF COMPLAINT:  Unresponsive; Acute resp failure   BRIEF  Patient is a 61 year old female with pertinent PMH of CLL, depression, anxiety, HLD, HTN presents to [facility] ED on 6/2 unresponsive.  On 6/2 patient called EMS for abdominal pain.  Patient initially was alert and talking but became unresponsive with AMS.  Patient was hypoxic with agonal breathing and was placed on NRB.  Patient was noted to be hypotensive.  Patient transported to [facility] ED.  Upon arrival to [facility] ED patient remained unresponsive with agonal respirations.  Patient intubated for airway protection and hypoxia.  BP initially 81/65.  Given IV fluids.  Started on levo.  Fever 100.9 F and WBC 104.4.  Started on cefepime, Flagyl, Vanco.  Cultures pending.  Glucose 198, BNP 534, troponin 109 then 112, LA 4.4 then 1.8, UA unremarkable, UDS negative.  CXR with large left pleural effusion and small right pleural effusion.  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  CT abdomen with ascending colitis constipation, small intestine wall thickening possibly related to gastroenteritis; small volume ascites; extensive abdominopelvic and inguinal adenopathy.   PCCM was consulted for bilateral pleural effusion  Pertinent  Medical History    has a past medical history of Anxiety, Chronic abdominal pain, Chronic lymphocytic leukemia (CLL), B-cell ([place]) (04/04/2016), CLL (chronic lymphocytic leukemia) ([place]), Depression with anxiety (07/07/2015), Family history of hyperlipidemia (12/14/2019), H. 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   has a past surgical history that includes Surgery of left middle finger on left hand (childhood ); Tubal ligation; Cholecystectomy; Colonoscopy (02/07/2011); Hammer toe surgery (Left); Axillary lymph node biopsy (Right, 03/28/2016); Hysteroscopy with D & C (N/A, 07/30/2016); Thoracentesis (Left, 03/02/2022); Chest tube insertion (N/A, 03/05/2022); IR EMBO VENOUS NOT HEMORR HEMANG  INC GUIDE ROADMAPPING (03/31/2022); IR US Guide Vasc Access Right (04/13/2022); IR US Guide Vasc Access Left (04/10/2022); IR LYMPHANGIOGRAM PEL/ABD BILAT (03/25/2022); IR Fluoro Guide CV Line Right (04/09/2022); and Radiology with anesthesia (N/A, 03/31/2022).  Significant Hospital Events: Including procedures, antibiotic start and stop dates in addition to other pertinent events   6/2 Presented [facility] ER unresponsive, intubated, shock on pressors > to [facility].  CT chest/abd w/ extensive LAN 6/3 Right chest tube placed. Left thora 2000 ml removed - milky 6/4 Extubated, off pressors.  Pleural fluid exudative, cx negative, lymphocytic. 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 Started on pulse dose steroids '50mg'$  pred.  6/7 Chest tube output over a liter 6/8 Left thoracentesis for 1400 cc of fluid 6/10 Chest tube removed 6/12 S/p right chest tube placement with 3.1 L drained 6/3 Tx from [facility] to [facility] 6/13 for initiation of palliative radiotherapy 6/14 No chest pain. Saturation 96% on 2 L. Chest tube drained 190 cc of milky fluid 6/15 Fell. CXR stable. Doing well. Got SIM for XRT.  RA.  Mod-large effusion on L. Refused CT. Octreotide. 6/16 TPN.  On  O2 . R CT 1000 cc out.  Started XRT 6/15, rituximab is being considered 6/17 CT issues with placement. Not sure if she wants new chest tube.  6/18 Dislodged right chest tube completely.  6/19 PleurX deferred due to poor candidate outpatient. Bilateral chest tubes placed due to worsening respiratory distress.  6/20 - new onset hypotension after bilateral  chest tube and midodrine started 6/21 2.8L out from CT in last 24 hours. IR consulted for thoracic duct embolization. Per IR: Lymphangiogram with thoracic duct embolization is not a routine procedure and will need to see when this procedure could potentially be performed 6/24-continues with significant output from chest tubes 6/25 - Afebrile .decreased chest tube output 6/26 - walked with PT in my presene.Bilateral chest tubes with draining chylothorax. IR uncertain (05/14/55) being STARTED SIROLIMUS  6/27- Last 90 min > 50cc drain of chyle on left sidde. Rt side - hardly any ouput.  Growing consensus that thoracic duct ligation is indicated based on curbside with Dr Kipp Brood surgeon and also running case by surgeon in [place], Dr Lorenso Courier of oncology. Started on sirolimus yesterday, She is eating, ambulating and also on TPN. No fevers D/wDr McCullohgh of IR - he has experience with TDE and will plan for procedure at [facility] or [facility] next few to several day. Needs General and a 6h block of time Patient agreeable for  TDE RT chest tube not draining well - new minor fissure fluid collection 6/28 -overnight dyspneic . Now on 3L [place]. LEft chst tube is leaking. Rt chest tube not much drain. TPN ongoing. Good appetie. IR considering TDE 04/06/2022 - patient aware 6/29 TPN stopped, doing calorie count. CT CAP with large bl effusions and tubes in ok position 6/30 tPA in left chest tube with good output 7/1 restarted TPN - not meeting caloric needs and wish to give her every shot possible at pleurodesis following TDE on 7/3. tPA in right chest tube 7/3 underwent TDE. Encephalopathic and increased WOB after procedure, placed on HHFNC. Dislodged left chest tube. 7/4 left chest tube replaced  7/5 2nd dose of tPA to right chest tube 7/6 3rd dose of tPA to right chest tube, 2nd dose of tPA to left chest tube 7/8 talc pleurodesis 7/10 right chest tube dislodged 7/9, chest x-ray appears stable from prior  Interim History  / Subjective:  Tolerated talc pleurodesis well y 7/8.  Complains of some nausea, some abdominal bloating  Objective   Blood pressure 113/67, pulse (!) 114, temperature 98.2 F (36.8 C), temperature source Oral, resp. rate (!) 30, height '5\' 5"'$  (1.651 m), weight 55.2 kg, last menstrual period 07/25/2016, SpO2 91 %.        Intake/Output Summary (Last 24 hours) at 03/24/2022 0800 Last data filed at 03/24/2022 2130 Gross per 24 hour  Intake 1590.39 ml  Output 1190 ml  Net 400.39 ml   Filed Weights   03/21/22 0425 03/22/22 0447 03/23/22 0500  Weight: 54.4 kg 52 kg 55.2 kg    I/O last 3 completed shifts: In: 2961.6 [I.V.:2814.6; IV Piggyback:147] Out: 2040 [Urine:1750; Chest Tube:290]  Intake/Output      07/09 0701 07/10 0700 07/10 0701 07/11 0700   I.V. (mL/kg) 1594.9 (28.9)    IV Piggyback 97    Total Intake(mL/kg) 1691.8 (30.6)    Urine (mL/kg/hr) 1000 (0.8)    Stool 0    Chest Tube 190    Total Output 1190    Net +501.8         Urine Occurrence 1 x    Stool Occurrence 1 x     Physical exam: Thin, chronically ill-appearing, Appears comfortable Fair air entry bilaterally with rales at the bases Bowel sounds appreciated S1-S2 appreciated Minimal  chest tube output on the left  Chest x-ray 7/9 reviewed by myself showing loculated effusion on right, left chest tube in place  WBC 20.3 Hgb 10 LDH stable Other labs reviewed Radiological data reviewed by myself  Assessment & Plan:   Acute hypoxic respiratory failure 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 -Thoracentesis on 02/15/2022 showed atypical lymphocytes suspicious for lymphoproliferative process.  Right axillary node biopsies on 02/18/2022 showing B-cell consistent with a history of lymphoma and CLL.  She is s/p TTE 7/3 with IR. -On octreotide since 02/25/2022 -On TPN 02/25/2022-03/13/2022, 7/1- -On prednisone 50 mg daily-started 02/18/2022 -On sirolimus since 03/10/2022 -Keep pulse oximetry above  94% -S/p bilateral talc pleurodesis 7/8 will improving chest tube output -Plan is for possible discontinuation of chest tube today  Hypotension-in the setting of chronic disease, profound malnutrition -On midodrine, continue the same  Severe protein calorie malnutrition -On TPN  Pain control/symptom management -Palliative care continues to assist with management  The patient is critically ill with multiple organ systems failure and requires high complexity decision making for assessment and support, frequent evaluation and titration of therapies, application of advanced monitoring technologies and extensive interpretation of multiple databases. Critical Care Time devoted to patient care services described in this note independent of APP/resident time (if applicable)  is 32 minutes.   Sherrilyn Rist MD [facility] Personal pager: See Amion If unanswered, please page CCM On-call: [phone]

## 2022-03-24 NOTE — Progress Notes (Signed)
PHARMACY - TOTAL PARENTERAL NUTRITION CONSULT NOTE  Indication:  bilateral chylothorax with high chest tube output  Patient Measurements: Height: _0  (165.1 cm) Weight: 55.2 kg (121 lb 11.1 oz) IBW/kg (Calculated) : 57 TPN AdjBW (KG): 64 Body mass index is 20.25 kg/m. Weight 64kg on admit and now 48.7 kg  Assessment:  19 YOF with pertinent PMH of CLL, depression, anxiety, HLD, HTN presents to [facility] ED on 6/2 unresponsive.  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 Patient also has bilateral chylothorax with high output. Pharmacy has been consulted for TPN.  Glucose / Insulin: new onset DM2 in addition to steroids, A1c 6.5% - CBGs elevated, but improving; range 134-237 - Used 32 units SSI in past 24hr, Semglee 10/d - Remains on prednisone 27m daily, sirolimus 26mdaily Electrolytes: Na elevated, Cl elevated.  CorrCa 10.3, WNL Renal: SCr < 1, BUN elevated Hepatic: alk phos elevated and rising, tbili and TG WNL, albumin <1.5, AST/ALT elevated but decreasing Intake / Output: UOP 1000 mL, CT O/P 190 mL s/p talc pleurodesis 7/8, LBM 7/9 GI Surgeries / Procedures:  7/3 thoracic duct embolization   Central access: PICC placed 02/27/22, replaced 7/3 TPN start date: 6/13-6/29, resumed 7/1   Nutritional Goals:  RD Estimated Needs Total Energy Estimated Needs: 2300-2600 Total Protein Estimated Needs: 140-160 grams Total Fluid Estimated Needs: >2.5 L  Current Nutrition:  TPN Low fat diet, no more than 7g fat per meal - only 50% of one meal charted Boost Breeze po TID (each: 250 kCal and 9g AA) - refusing doses since 7/4 Ensure MAX Protein daily (each: 150 kCal and 30g AA)  Plan:  Continue TPN at goal rate of 100 ml/hr Electrolytes in TPN: Na 40 mEq/L, K 10 mEq/L, Ca 5 mEq/L, Mg 4 mEq/L, Phos 12.5 mmol/L, Cl:Ac 1:2 Continue daily multivitamin PO supplement Continue resistant SSI and Semglee 10 units SQ daily TPN labs on Mon/Thurs.   ChGretta ArabharmD,  BCPS Clinical Pharmacist [facility] main pharmacy [phone]/06/2022 8:02 AM

## 2022-03-25 ENCOUNTER — Inpatient Hospital Stay (HOSPITAL_COMMUNITY): Payer: Medicaid Other

## 2022-03-25 DIAGNOSIS — J940 Chylous effusion: Secondary | ICD-10-CM | POA: Diagnosis not present

## 2022-03-25 LAB — GLUCOSE, CAPILLARY
Glucose-Capillary: 99 mg/dL (ref 70–99)
Glucose-Capillary: 164 mg/dL — ABNORMAL HIGH (ref 70–99)
Glucose-Capillary: 122 mg/dL — ABNORMAL HIGH (ref 70–99)
Glucose-Capillary: 217 mg/dL — ABNORMAL HIGH (ref 70–99)
Glucose-Capillary: 139 mg/dL — ABNORMAL HIGH (ref 70–99)
Glucose-Capillary: 105 mg/dL — ABNORMAL HIGH (ref 70–99)

## 2022-03-25 LAB — LACTATE DEHYDROGENASE: LDH: 332 U/L — ABNORMAL HIGH (ref 98–192)

## 2022-03-25 LAB — BASIC METABOLIC PANEL
Anion gap: 11 (ref 5–15)
BUN: 41 mg/dL — ABNORMAL HIGH (ref 8–23)
CO2: 19 mmol/L — ABNORMAL LOW (ref 22–32)
Calcium: 8.5 mg/dL — ABNORMAL LOW (ref 8.9–10.3)
Chloride: 113 mmol/L — ABNORMAL HIGH (ref 98–111)
Creatinine, Ser: 0.48 mg/dL (ref 0.44–1.00)
GFR, Estimated: >60 mL/min (ref >60)
Glucose, Bld: 96 mg/dL (ref 70–99)
Potassium: 5 mmol/L (ref 3.5–5.1)
Sodium: 143 mmol/L (ref 135–145)

## 2022-03-25 LAB — CBC WITH DIFFERENTIAL/PLATELET
Abs Immature Granulocytes: 0.01 10*3/uL (ref 0.00–0.07)
Basophils Absolute: 0.1 10*3/uL (ref 0.0–0.1)
Basophils Relative: 1 %
Eosinophils Absolute: 0 10*3/uL (ref 0.0–0.5)
Eosinophils Relative: 0 %
HCT: 33.4 % — ABNORMAL LOW (ref 36.0–46.0)
Hemoglobin: 10.2 g/dL — ABNORMAL LOW (ref 12.0–15.0)
Immature Granulocytes: 0 %
Lymphocytes Relative: 74 %
Lymphs Abs: 11.4 10*3/uL — ABNORMAL HIGH (ref 0.7–4.0)
MCH: 29.4 pg (ref 26.0–34.0)
MCHC: 30.5 g/dL (ref 30.0–36.0)
MCV: 96.3 fL (ref 80.0–100.0)
Monocytes Absolute: 0.1 10*3/uL (ref 0.1–1.0)
Monocytes Relative: 1 %
Neutro Abs: 3.7 10*3/uL (ref 1.7–7.7)
Neutrophils Relative %: 24 %
Platelets: 120 10*3/uL — ABNORMAL LOW (ref 150–400)
RBC: 3.47 MIL/uL — ABNORMAL LOW (ref 3.87–5.11)
RDW: 20.3 % — ABNORMAL HIGH (ref 11.5–15.5)
WBC: 15.3 10*3/uL — ABNORMAL HIGH (ref 4.0–10.5)
nRBC: 0.2 % (ref 0.0–0.2)

## 2022-03-25 LAB — MAGNESIUM: Magnesium: 2.3 mg/dL (ref 1.7–2.4)

## 2022-03-25 MED ORDER — ENSURE ENLIVE PO LIQD
237.0000 mL | Freq: Three times a day (TID) | ORAL | Status: DC
  Administered 2022-03-25 (×2): 237 mL via ORAL

## 2022-03-25 MED ORDER — TRAVASOL 10 % IV SOLN
INTRAVENOUS | Status: DC
  Filled 2022-03-25: qty 1500

## 2022-03-25 MED ORDER — FLEET ENEMA 7-19 GM/118ML RE ENEM: 1.0000 | ENEMA | Freq: Once | RECTAL | Status: DC

## 2022-03-25 NOTE — Progress Notes (Addendum)
Nutrition Follow-up  DOCUMENTATION CODES:   Severe malnutrition in context of chronic illness, Underweight  INTERVENTION:  - will d/c Boost Breeze.  - will order Ensure Plus High Protein TID, each supplement provides 350 kcal and 20 grams of protein.  - continue TPN regimen.   - with decrease in output and ability to remove bilateral chest tubes, will remove fat restriction part of diet. If PO intakes significantly increase, may need to consider resuming a more liberalized fat restriction.    NUTRITION DIAGNOSIS:   Severe Malnutrition related to chronic illness (CLL) as evidenced by severe muscle depletion, severe fat depletion. -ongoing  GOAL:   Patient will meet greater than or equal to 90% of their needs -met with minimal PO intake and TPN regimen  MONITOR:   PO intake, Supplement acceptance, Labs, Weight trends, Skin, Other (Comment) (TPN regimen)   ASSESSMENT:   61 yo female admitted with AMS and unresponsiveness. PMH includes CLL, depression, anxiety, HLD, HTN, lymphadenopathy, iron deficiency.  Significant Events: 6/02 - admitted unresponsive, intubated 6/03 - s/p right chest tube placement, s/p L thoracentesis with 2 L removed 6/04 - extubated 6/08 - s/p thoracentesis with 1.4 L fluid removed 6/10 - chest tube removed 6/12 - s/p right chest tube placement with 3.1 L drained 6/13 - transferred from [facility] to [facility] for initiation of palliative XRT, TPN started 6/14 - diet advanced from NPO to Regular with 7 grams/meal restriction in place 6/15- XRT simulation + first session XRT 6/18- R-sided chest tube dislodged 6/19- bilateral chest tubes placed d/t worsening respiratory distress  6/29- TPN stopped 7/1- TPN resumed  7/3- s/p lymphangiogram and thoracic duct embolization; replacement of double lumen PICC in L basilic 7/4- L chest tube replaced 7/9- R chest tube dislodged and removed 7/11- L chest tube removed by PCCM   Patient discussed in rounds this AM. L chest  tube removed shortly before rounds.   She is receiving TPN at goal rate of 100 ml/hr via PICC. This regimen is providing 2308 kcal and 150 grams protein.  Patient sitting up in bed with a tray with orange wedges and pineapple pieces in front of her. She had taken a small bite of an orange wedge. She reports having some broth this AM.   Patient reports decreased appetite in part d/t pain with oral intake. She reports that she has not had a Boost Breeze in several days but that she had a chocolate Boost several days ago and it tasted like a milk shake, which she enjoyed.  Provided her with a chocolate Boost poured over ice.   She was last weighed on 7/9 and weight on that date stable since 7/3.   Labs reviewed; CBGs: 99, 164, 122 mg/dl, Cl: 113 mmol/l, BUN: 41 mg/dl, Ca: 8.5 mg/dl.   Medications reviewed; 100 mg colace BID, sliding scale novolog, 5 mg IV reglan QID, 17 g miralax/day, 50 mg deltasone/day.   Diet Order:   Diet Order             Diet regular Room service appropriate? Yes; Fluid consistency: Thin  Diet effective now                   EDUCATION NEEDS:   Education needs have been addressed  Skin:  Skin Assessment: Skin Integrity Issues: Skin Integrity Issues:: Stage II Stage I: sacrum Stage II: sacrum and R back  Last BM:  7/10 (type 4 x1, medium amount)  Height:   Ht Readings from Last 1  Encounters:  03/12/2022 $RemoveB'5\' 5"'dhSnqUMy$  (1.651 m)    Weight:   Wt Readings from Last 1 Encounters:  03/23/22 55.2 kg     BMI:  Body mass index is 20.25 kg/m.  Estimated Nutritional Needs:  Kcal:  0919-8022 Protein:  140-160 grams Fluid:  >2.5 L     Jarome Matin, MS, RD, LDN, CNSC Registered Dietitian II Inpatient Clinical Nutrition RD pager # and on-call/weekend pager # available in [facility]

## 2022-03-25 NOTE — Progress Notes (Signed)
[place] Progress Note Patient Name: [name] DOB: 03-Sep-1961 MRN: [id number]   Date of Service  03/25/2022  HPI/Events of Note  Abdominal film reveals: 1. Dilated air-filled stomach may represent gastric outlet obstruction or gastroparesis. 2. Contrast from recent lymphangiogram. The patient is already on Reglan IV and Mylicon PO.  eICU Interventions  Plan: NPO. NGT to LIS.     Intervention Category Major Interventions: Other:  Lysle Dingwall 03/25/2022, 11:58 PM

## 2022-03-25 NOTE — Progress Notes (Signed)
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 Weight 64kg on admit and now 48.7 kg  Assessment:  69 YOF with pertinent PMH of CLL, depression, anxiety, HLD, HTN presents to [facility] ED on 6/2 unresponsive.  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 Patient also has bilateral chylothorax with high output. Pharmacy has been consulted for TPN.  Glucose / Insulin: new onset DM2 in addition to steroids, A1c 6.5% - CBGs improving; range 99-171 - Used 20 units SSI in past 24hr, Semglee 10/d (last dose on 7/10) - Remains on prednisone 50mg  daily, sirolimus 2mg  daily Electrolytes: K up to 5.0, Cl elevated/improved, CO2 low, Ca 8.5 (CorrCa 10.5) Renal: SCr < 1, BUN elevated Hepatic: alk phos elevated and rising, tbili and TG WNL, albumin <1.5, AST/ALT elevated but decreasing Intake / Output: UOP 900 mL, CT outpt down to 80 mL s/p talc pleurodesis 7/8, LBM 7/9 GI Surgeries / Procedures:  7/3 thoracic duct embolization   Central access: PICC placed 02/27/22, replaced 7/3 TPN start date: 6/13-6/29, resumed 7/1   Nutritional Goals:  RD Estimated Needs Total Energy Estimated Needs: 2300-2600 Total Protein Estimated Needs: 140-160 grams Total Fluid Estimated Needs: >2.5 L  Current Nutrition:  TPN Low fat diet, no more than 7g fat per meal - minimal intake per RN Boost Breeze po TID (each: 250 kCal and 9g AA) - refusing doses since 7/4 Ensure MAX Protein daily (each: 150 kCal and 30g AA)  Plan:  Continue TPN at goal rate of 100 ml/hr Electrolytes in TPN: Na 40 mEq/L, K 0 mEq/L, Ca 0 mEq/L, Mg 2 mEq/L, Phos 12.5 mmol/L, Cl:Ac - max Ac Resume standard multivitamin, trace elements in TPN Add famotidine to TPN  Continue resistant SSI and change long-acting insulin  to regular insulin in TPN  TPN labs on Mon/Thurs.   Gretta Arab PharmD, BCPS Clinical Pharmacist [facility] main pharmacy [phone] 03/25/2022 7:13 AM

## 2022-03-25 NOTE — Progress Notes (Signed)
NAME:  [name], MRN:  [id number], DOB:  [birth year], LOS: 34 ADMISSION DATE:  02/19/2022, CONSULTATION DATE:  6/2 REFERRING MD:  Dr. Alvino Chapel, CHIEF COMPLAINT:  Unresponsive; Acute resp failure   BRIEF  Patient is a 61 year old female with pertinent PMH of CLL, depression, anxiety, HLD, HTN presents to [facility] ED on 6/2 unresponsive.  On 6/2 patient called EMS for abdominal pain.  Patient initially was alert and talking but became unresponsive with AMS.  Patient was hypoxic with agonal breathing and was placed on NRB.  Patient was noted to be hypotensive.  Patient transported to [facility] ED.  Upon arrival to [facility] ED patient remained unresponsive with agonal respirations.  Patient intubated for airway protection and hypoxia.  BP initially 81/65.  Given IV fluids.  Started on levo.  Fever 100.9 F and WBC 104.4.  Started on cefepime, Flagyl, Vanco.  Cultures pending.  Glucose 198, BNP 534, troponin 109 then 112, LA 4.4 then 1.8, UA unremarkable, UDS negative.  CXR with large left pleural effusion and small right pleural effusion.  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  CT abdomen with ascending colitis constipation, small intestine wall thickening possibly related to gastroenteritis; small volume ascites; extensive abdominopelvic and inguinal adenopathy.   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   has a past surgical history that includes Surgery of left middle finger on left hand (childhood ); Tubal ligation; Cholecystectomy; Colonoscopy (02/07/2011); Hammer toe surgery (Left); Axillary lymph node biopsy (Right, 03/28/2016); Hysteroscopy with D & C (N/A, 07/30/2016); Thoracentesis (Left, 03/03/2022); Chest tube insertion (N/A, 02/25/2022); IR EMBO VENOUS NOT HEMORR HEMANG  INC GUIDE ROADMAPPING (04/11/2022); IR US Guide Vasc Access Right (03/20/2022); IR US Guide Vasc Access Left (04/06/2022); IR LYMPHANGIOGRAM PEL/ABD BILAT (03/15/2022); IR Fluoro Guide CV Line Right (03/21/2022); and Radiology with anesthesia (N/A, 04/03/2022).  Significant Hospital Events: Including procedures, antibiotic start and stop dates in addition to other pertinent events   6/2 Presented [facility] ER unresponsive, intubated, shock on pressors > to [facility]  CT chest/abd w/ extensive LAN 6/3 Right chest tube placed. Left thora 2000 ml removed - milky 6/4 Extubated, off pressors.  Pleural fluid exudative, cx negative, lymphocytic. 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 Started on pulse dose steroids '50mg'$  pred.  6/7 Chest tube output over a liter 6/8 Left thoracentesis for 1400 cc of fluid 6/10 Chest tube removed 6/12 S/p right chest tube placement with 3.1 L drained 6/3 Tx from [facility] to [facility] 6/13 for initiation of palliative radiotherapy 6/14 No chest pain. Saturation 96% on 2 L. Chest tube drained 190 cc of milky fluid 6/15 Fell. CXR stable. Doing well. Got SIM for XRT.  RA.  Mod-large effusion on L. Refused CT. Octreotide. 6/16 TPN.  On [place] O2 . R CT 1000 cc out.  Started XRT 6/15, rituximab is being considered 6/17 CT issues with placement. Not sure if she wants new chest tube.  6/18 Dislodged right chest tube completely.  6/19 PleurX deferred due to poor candidate outpatient. Bilateral chest tubes placed due to worsening respiratory distress.  6/20 - new onset hypotension after bilateral  chest tube and midodrine started 6/21 2.8L out from CT in last 24 hours. IR consulted for thoracic duct embolization. Per IR: Lymphangiogram with thoracic duct embolization is not a routine procedure and will need to see when this procedure could potentially be performed 6/24-continues with significant output from chest tubes 6/25 - Afebrile .decreased chest tube output 6/26 - walked with PT in my presene.Bilateral chest tubes with draining chylothorax. IR uncertain (6/60/63) being STARTED SIROLIMUS  6/27- Last 90 min > 50cc drain of chyle on left sidde. Rt side - hardly any ouput.  Growing consensus that thoracic duct ligation is indicated based on curbside with Dr Kipp Brood surgeon and also running case by surgeon in [place], Dr Lorenso Courier of oncology. Started on sirolimus yesterday, She is eating, ambulating and also on TPN. No fevers D/wDr McCullohgh of IR - he has experience with TDE and will plan for procedure at [facility] or [facility] next few to several day. Needs General and a 6h block of time Patient agreeable for  TDE RT chest tube not draining well - new minor fissure fluid collection 6/28 -overnight dyspneic . Now on 3L [place]. LEft chst tube is leaking. Rt chest tube not much drain. TPN ongoing. Good appetie. IR considering TDE 04/08/2022 - patient aware 6/29 TPN stopped, doing calorie count. CT CAP with large bl effusions and tubes in ok position 6/30 tPA in left chest tube with good output 7/1 restarted TPN - not meeting caloric needs and wish to give her every shot possible at pleurodesis following TDE on 7/3. tPA in right chest tube 7/3 underwent TDE. Encephalopathic and increased WOB after procedure, placed on HHFNC. Dislodged left chest tube. 7/4 left chest tube replaced  7/5 2nd dose of tPA to right chest tube 7/6 3rd dose of tPA to right chest tube, 2nd dose of tPA to left chest tube 7/8 talc pleurodesis 7/10 right chest tube dislodged 7/9, chest x-ray appears stable from prior 7/11 decreased  chest tube output  Interim History / Subjective:  Tolerated talc pleurodesis well on 7/8.  Chest tube on right dislodged 7/10 No significant abdominal pain, nausea is better  Objective   Blood pressure 115/74, pulse (!) 111, temperature 98.3 F (36.8 C), temperature source Oral, resp. rate (!) 30, height '5\' 5"'$  (1.651 m), weight 55.2 kg, last menstrual period 07/25/2016, SpO2 93 %.        Intake/Output Summary (Last 24 hours) at 03/25/2022 0813 Last data filed at 03/25/2022 0500 Gross per 24 hour  Intake 2530.89 ml  Output 980 ml  Net 1550.89 ml   Filed Weights   03/21/22 0425 03/22/22 0447 03/23/22 0500  Weight: 54.4 kg 52 kg 55.2 kg    I/O last 3 completed shifts: In: 2967.5 [I.V.:2817.4; IV Piggyback:150.1] Out: 0160 [Urine:1500; Chest Tube:170]  Intake/Output      07/10 0701 07/11 0700 07/11 0701 07/12 0700   I.V. 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 -Thoracentesis on 02/15/2022 showed atypical lymphocytes suspicious for lymphoproliferative process.  Right axillary node biopsies on 02/18/2022 showing B-cell consistent with a history of lymphoma and CLL.  She is s/p thoracic duct embolization 7/3 with IR -On octreotide since 02/25/2022 -On TPN 02/25/2022-03/13/2022, 7/1- -On prednisone 50 mg daily-started 02/18/2022 -On sirolimus since 03/10/2022 -Keep pulse  oximetry above 94% -S/p bilateral talc pleurodesis 7/8 will improving chest tube output -Possible discontinuation of left chest tube today  Hypotension -On midodrine -We will continue the same  Severe protein calorie malnutrition -Continues on TPN  Labs in a.m.  Pain control/symptom management -Palliative care continues to assist with management  Sherrilyn Rist, MD [place] [facility] Pager: See Shea Evans

## 2022-03-25 NOTE — Procedures (Signed)
Left-sided chest tube was removed uneventfully  Tolerated well

## 2022-03-25 NOTE — Progress Notes (Signed)
eLink Physician-Brief Progress Note Patient Name: [name] DOB: 1961/07/25 MRN: [id number]   Date of Service  03/25/2022  HPI/Events of Note  Nursing reports increasing abdominal distention and pain.   eICU Interventions  Plan: Portable abdominal pain x-ray STAT. Further management based on abdominal film findings.      Intervention Category Major Interventions: Other:  Lysle Dingwall 03/25/2022, 11:01 PM

## 2022-03-26 ENCOUNTER — Inpatient Hospital Stay (HOSPITAL_COMMUNITY): Payer: Medicaid Other

## 2022-03-26 DIAGNOSIS — Z7189 Other specified counseling: Secondary | ICD-10-CM

## 2022-03-26 DIAGNOSIS — J940 Chylous effusion: Secondary | ICD-10-CM | POA: Diagnosis not present

## 2022-03-26 DIAGNOSIS — Z515 Encounter for palliative care: Secondary | ICD-10-CM | POA: Diagnosis not present

## 2022-03-26 LAB — BLOOD GAS, ARTERIAL
Acid-base deficit: 9.5 mmol/L — ABNORMAL HIGH (ref 0.0–2.0)
Bicarbonate: 23.9 mmol/L (ref 20.0–28.0)
O2 Saturation: 99.7 %
Patient temperature: 37
pCO2 arterial: 97 mmHg (ref 32–48)
pH, Arterial: 7 — CL (ref 7.35–7.45)
pO2, Arterial: 117 mmHg — ABNORMAL HIGH (ref 83–108)

## 2022-03-26 LAB — CBC WITH DIFFERENTIAL/PLATELET
Abs Immature Granulocytes: 0 10*3/uL (ref 0.00–0.07)
Basophils Absolute: 0 10*3/uL (ref 0.0–0.1)
Basophils Relative: 0 %
Eosinophils Absolute: 0 10*3/uL (ref 0.0–0.5)
Eosinophils Relative: 0 %
HCT: 30.6 % — ABNORMAL LOW (ref 36.0–46.0)
Hemoglobin: 9.7 g/dL — ABNORMAL LOW (ref 12.0–15.0)
Immature Granulocytes: 0 %
Lymphocytes Relative: 96 %
Lymphs Abs: 11.5 10*3/uL — ABNORMAL HIGH (ref 0.7–4.0)
MCH: 29.4 pg (ref 26.0–34.0)
MCHC: 31.7 g/dL (ref 30.0–36.0)
MCV: 92.7 fL (ref 80.0–100.0)
Monocytes Absolute: 0.1 10*3/uL (ref 0.1–1.0)
Monocytes Relative: 1 %
Neutro Abs: 0.4 10*3/uL — CL (ref 1.7–7.7)
Neutrophils Relative %: 3 %
Platelets: 108 10*3/uL — ABNORMAL LOW (ref 150–400)
RBC: 3.3 MIL/uL — ABNORMAL LOW (ref 3.87–5.11)
RDW: 20.2 % — ABNORMAL HIGH (ref 11.5–15.5)
WBC: 11.8 10*3/uL — ABNORMAL HIGH (ref 4.0–10.5)
nRBC: 0.8 % — ABNORMAL HIGH (ref 0.0–0.2)

## 2022-03-26 LAB — MAGNESIUM: Magnesium: 2.1 mg/dL (ref 1.7–2.4)

## 2022-03-26 LAB — LACTIC ACID, PLASMA: Lactic Acid, Venous: 0.9 mmol/L (ref 0.5–1.9)

## 2022-03-26 LAB — BASIC METABOLIC PANEL
Anion gap: 7 (ref 5–15)
BUN: 54 mg/dL — ABNORMAL HIGH (ref 8–23)
CO2: 24 mmol/L (ref 22–32)
Calcium: 7.8 mg/dL — ABNORMAL LOW (ref 8.9–10.3)
Chloride: 109 mmol/L (ref 98–111)
Creatinine, Ser: 0.65 mg/dL (ref 0.44–1.00)
GFR, Estimated: >60 mL/min (ref >60)
Glucose, Bld: 181 mg/dL — ABNORMAL HIGH (ref 70–99)
Potassium: 4.8 mmol/L (ref 3.5–5.1)
Sodium: 140 mmol/L (ref 135–145)

## 2022-03-26 LAB — TROPONIN I (HIGH SENSITIVITY): Troponin I (High Sensitivity): 104 ng/L (ref <18)

## 2022-03-26 LAB — LIPASE, BLOOD: Lipase: 15 U/L (ref 11–51)

## 2022-03-26 LAB — GLUCOSE, CAPILLARY
Glucose-Capillary: 157 mg/dL — ABNORMAL HIGH (ref 70–99)
Glucose-Capillary: 147 mg/dL — ABNORMAL HIGH (ref 70–99)

## 2022-03-26 LAB — LACTATE DEHYDROGENASE: LDH: 284 U/L — ABNORMAL HIGH (ref 98–192)

## 2022-03-26 LAB — PHOSPHORUS: Phosphorus: 6.8 mg/dL — ABNORMAL HIGH (ref 2.5–4.6)

## 2022-03-26 LAB — AMYLASE: Amylase: 75 U/L (ref 28–100)

## 2022-03-26 MED ORDER — GLYCOPYRROLATE 1 MG PO TABS: 1.0000 mg | ORAL_TABLET | ORAL | Status: DC | PRN

## 2022-03-26 MED ORDER — IOHEXOL 300 MG/ML  SOLN
80.0000 mL | Freq: Once | INTRAMUSCULAR | Status: AC | PRN
  Administered 2022-03-26: 80 mL via INTRAVENOUS

## 2022-03-26 MED ORDER — SIMETHICONE 80 MG PO CHEW
80.0000 mg | CHEWABLE_TABLET | Freq: Four times a day (QID) | ORAL | Status: DC | PRN
Start: 2022-03-26 — End: 2022-03-26

## 2022-03-26 MED ORDER — HALOPERIDOL 1 MG PO TABS: 0.5000 mg | ORAL_TABLET | ORAL | Status: DC | PRN

## 2022-03-26 MED ORDER — HYDROMORPHONE HCL 1 MG/ML IJ SOLN
1.0000 mg | Freq: Once | INTRAMUSCULAR | Status: AC
  Administered 2022-03-26: 1 mg via INTRAVENOUS

## 2022-03-26 MED ORDER — POLYVINYL ALCOHOL 1.4 % OP SOLN: 1.0000 [drp] | Freq: Four times a day (QID) | OPHTHALMIC | Status: DC | PRN

## 2022-03-26 MED ORDER — NALOXONE HCL 0.4 MG/ML IJ SOLN
INTRAMUSCULAR | Status: AC
  Filled 2022-03-26: qty 1

## 2022-03-26 MED ORDER — SODIUM CHLORIDE (PF) 0.9 % IJ SOLN
INTRAMUSCULAR | Status: AC
  Filled 2022-03-26: qty 50

## 2022-03-26 MED ORDER — BIOTENE DRY MOUTH MT LIQD: 15.0000 mL | OROMUCOSAL | Status: DC | PRN

## 2022-03-26 MED ORDER — HYDROMORPHONE BOLUS VIA INFUSION: 0.5000 mg | INTRAVENOUS | Status: DC | PRN

## 2022-03-26 MED ORDER — NALOXONE HCL 0.4 MG/ML IJ SOLN
0.4000 mg | INTRAMUSCULAR | Status: DC | PRN
Start: 2022-03-26 — End: 2022-03-26
  Administered 2022-03-26 (×2): 0.4 mg via INTRAVENOUS
  Filled 2022-03-26: qty 1

## 2022-03-26 MED ORDER — SODIUM CHLORIDE 0.9 % IV BOLUS
1000.0000 mL | Freq: Once | INTRAVENOUS | Status: AC
  Administered 2022-03-26: 1000 mL via INTRAVENOUS

## 2022-03-26 MED ORDER — HALOPERIDOL LACTATE 5 MG/ML IJ SOLN: 0.5000 mg | INTRAMUSCULAR | Status: DC | PRN

## 2022-03-26 MED ORDER — GLYCOPYRROLATE 0.2 MG/ML IJ SOLN: 0.2000 mg | INTRAMUSCULAR | Status: DC | PRN

## 2022-03-26 MED ORDER — LORAZEPAM 2 MG/ML IJ SOLN: 0.5000 mg | INTRAMUSCULAR | Status: DC | PRN

## 2022-03-26 MED ORDER — NOREPINEPHRINE 4 MG/250ML-% IV SOLN
0.0000 ug/min | INTRAVENOUS | Status: DC
  Administered 2022-03-26: 2 ug/min via INTRAVENOUS
  Filled 2022-03-26: qty 250

## 2022-03-26 MED ORDER — HALOPERIDOL LACTATE 2 MG/ML PO CONC: 0.5000 mg | ORAL | Status: DC | PRN

## 2022-03-26 MED ORDER — SODIUM CHLORIDE 0.9 % IV SOLN
1.0000 mg/h | INTRAVENOUS | Status: DC
  Administered 2022-03-26: 2 mg/h via INTRAVENOUS
  Filled 2022-03-26: qty 5

## 2022-03-27 LAB — FUNGUS CULTURE WITH STAIN

## 2022-03-27 LAB — FUNGUS CULTURE RESULT

## 2022-03-27 LAB — SIROLIMUS LEVEL: Sirolimus (Rapamycin): 7 ng/mL (ref 3.0–20.0)

## 2022-03-27 LAB — FUNGAL ORGANISM REFLEX

## 2022-04-06 LAB — ACID FAST CULTURE WITH REFLEXED SENSITIVITIES (MYCOBACTERIA): Acid Fast Culture: NEGATIVE

## 2022-04-15 NOTE — Progress Notes (Signed)
[facility] Progress Note Patient Name: [name] DOB: April 21, 1961 MRN: [id number]   Date of Service  04-05-22  HPI/Events of Note  CT Scan abdomen/pelvis with contrast findings: 1. Postprocedural changes of lipiodol embolization from recent bilateral abdominopelvic lymphangiogram (performed 03/19/2022). Bulky lymphadenopathy throughout the abdomen and pelvis appears grossly similar to the recent prior study, with exception of new high attenuation material within the lymphatic system. 2. Increased moderate to large volume of ascites. Diffuse body wall edema. Bilateral pleural effusions. Overall, imaging findings are indicative of a state of anasarca. 3. Interval development of gas within the complex left-sided pleural fluid collections, which could be iatrogenic in the setting of recent catheter removal, however, clinical correlation for signs and symptoms of empyema is recommended. 4. Persistent bulky lymphadenopathy along the chest wall bilaterally (left-greater-than-right), as above. 5. Morphologic changes in the liver suggesting cirrhosis. 6. Aortic atherosclerosis.  eICU Interventions  Continue present management.     Intervention Category Major Interventions: Other:  Lysle Dingwall 04/05/2022, 6:41 AM

## 2022-04-15 NOTE — Progress Notes (Signed)
   Palliative Medicine Inpatient Follow Up Note   Patient is a 61 year old female with pertinent PMH of CLL, depression, anxiety, HLD, HTN presents to [facility] ED on 6/2 unresponsive. Exceptionally complicated hospitalization. Patient began to decompensate overnight and the Palliative care team was asked to see [name] this morning.   Today's Discussion 04-11-22  *Please note that this is a verbal dictation therefore any spelling or grammatical errors are due to the "[place]" system interpretation.  Chart reviewed inclusive of vital signs, progress notes, laboratory results, and diagnostic images.   I met at bedside with [name] and her son, [name]. [name] appeared to be in the active stages of dying as per review of her VS and discussion with nursing. Multiple acute occurrences overnight requiring escalation of care with uncontrolled pain. Narcan administered per Dr. Emmit Alexanders note review.   I kindly asked [name] to step outside of [place] room and we called patients daughter, [name] on speaker phone. I explained given patient present clinical state she appears to have transitioned from acutely ill to actively dying based upon her BP's, RR, and mental state. I shared that at this point continuing aggressive medical interventions are not benefiting [name] and if anything we are one prolonging the inevitable and secondly contributing greatly to an increase in her suffering.  We talked about stopping all aggressive measures such as labs draws, bipap, monitoring, additional diagnostic tests and making [name] comfortable through the use of medications to aid in pain, dyspnea, and anxiety relief. Patients son and daughter are in agreement with this.  Comfort measures initiations and Ccm team communicated with.   Reviewed patients time on earth will be quite limited.   Objective Assessment: Vital Signs Vitals:   2022-04-11 0700 04-11-22 0800  BP: (!) 40/24   Pulse: (!) 45   Resp: (!) 23 (!) 43   Temp:    SpO2: (!) 79%     Intake/Output Summary (Last 24 hours) at 11-Apr-2022 0810 Last data filed at 2022-04-11 0738 Gross per 24 hour  Intake 3194.99 ml  Output 200 ml  Net 2994.99 ml   Last Weight  Most recent update: 03/23/2022  6:36 PM    Weight  55.2 kg (121 lb 11.1 oz)            Gen:  Very ill appearing AA F HEENT: NGT. Dry mucous membranes CV: Irregular rate and rhythm PULM: ON Bipap - max ABD: Distended diffusely painful EXT: No edema Neuro:Opens eyes and responds to commands  SUMMARY OF RECOMMENDATIONS   DNAR/DNI  Comfort measures  Will initiate dilaudid gtt  Unrestricted Visitation  Anticipate in hospital death w/in minutes to hours  Time Spent: 22  Billing based on MDM: High  Problems Addressed: One acute or chronic illness or injury that poses a threat to life or bodily function  Amount and/or Complexity of Data: Category 3:Discussion of management or test interpretation with external physician/other qualified health care professional/appropriate source (not separately reported)  Risks: Decision not to resuscitate or to de-escalate care because of poor prognosis ______________________________________________________________________________________ [place] Team Team Cell Phone: [phone] Please utilize secure chat with additional questions, if there is no response within 30 minutes please call the above phone number  Palliative Medicine Team providers are available by phone from 7am to 7pm daily and can be reached through the team cell phone.  Should this patient require assistance outside of these hours, please call the patient's attending physician.

## 2022-04-15 NOTE — Progress Notes (Signed)
@  0310 this RN entered patients room. Patient was restless in bed, tearful, complaining of persistent abdominal pain, 10/10 on pain scale. PRN pain medication given. Patient had increased HR and RR at 40-50.   '@0345'$  this RN entered patients room and found patient still in bed, neck extended back, RR decreased, SpO2 85-90 on 6L. NRB placed on patient and this RN Brewing technologist took patient for STAT abdominal CT Scan. While in CT, patient's responsiveness decreased. Charge RN called E-Link provider, and Head CT Scan was also ordered.   Patients blood pressure decreased while in CT, NS 1072m bolus ordered and Levophed started at 162m per MD. Narcan 0.4 given x2. RT at bedside and patient placed on BiPAP. Patient started responding to voice, tracking family and this RN '@0556'$ .

## 2022-04-15 NOTE — Progress Notes (Signed)
[facility] Progress Note Patient Name: [name] DOB: [birth year] MRN: [id number]   Date of Service  04/08/2022  HPI/Events of Note  ABG on NRB = 7/97/117/23.9 c/w respiratory acidosis.   eICU Interventions  Plan: Narcan 0.4 mg IV now. Trial of BiPAP. Repeat ABG after on BiPAP X 1 hour.     Intervention Category Major Interventions: Respiratory failure - evaluation and management;Acid-Base disturbance - evaluation and management  [name] 04/08/2022, 5:47 AM

## 2022-04-15 NOTE — Progress Notes (Signed)
[place] Spiritual Care Note  Visited with son and cousins at bedside per consult for EOL support. Introduced [place] and assured family of chaplain's prayers per their request. They report that they are doing well now as they support each other and are aware of ongoing chaplain availability, should additional needs arise.   [place], [place], [facility] Pager [phone] Voicemail [phone]

## 2022-04-15 NOTE — Progress Notes (Signed)
NAME:  [name], MRN:  [id number], DOB:  04/01/1961, LOS: 45 ADMISSION DATE:  03/11/2022, CONSULTATION DATE:  6/2 REFERRING MD:  Dr. Alvino Chapel, CHIEF COMPLAINT:  Unresponsive; Acute resp failure   BRIEF  Patient is a 61 year old female with pertinent PMH of CLL, depression, anxiety, HLD, HTN presents to [facility] ED on 6/2 unresponsive.  On 6/2 patient called EMS for abdominal pain.  Patient initially was alert and talking but became unresponsive with AMS.  Patient was hypoxic with agonal breathing and was placed on NRB.  Patient was noted to be hypotensive.  Patient transported to [facility] ED.  Upon arrival to [facility] ED patient remained unresponsive with agonal respirations.  Patient intubated for airway protection and hypoxia.  BP initially 81/65.  Given IV fluids.  Started on levo.  Fever 100.9 F and WBC 104.4.  Started on cefepime, Flagyl, Vanco.  Cultures pending.  Glucose 198, BNP 534, troponin 109 then 112, LA 4.4 then 1.8, UA unremarkable, UDS negative.  CXR with large left pleural effusion and small right pleural effusion.  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  CT abdomen with ascending colitis constipation, small intestine wall thickening possibly related to gastroenteritis; small volume ascites; extensive abdominopelvic and inguinal adenopathy.   PCCM was consulted for bilateral pleural effusion  Pertinent  Medical History    has a past medical history of Anxiety, Chronic abdominal pain, Chronic lymphocytic leukemia (CLL), B-cell ([place]) (04/04/2016), CLL (chronic lymphocytic leukemia) ([place]), Depression with anxiety (07/07/2015), Family history of hyperlipidemia (12/14/2019), H. 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   has a past surgical history that includes Surgery of left middle finger on left hand (childhood ); Tubal ligation; Cholecystectomy; Colonoscopy (02/07/2011); Hammer toe surgery (Left); Axillary lymph node biopsy (Right, 03/28/2016); Hysteroscopy with D & C (N/A, 07/30/2016); Thoracentesis (Left, 02/22/2022); Chest tube insertion (N/A, 02/25/2022); IR EMBO VENOUS NOT HEMORR HEMANG  INC GUIDE ROADMAPPING (04/04/2022); IR US Guide Vasc Access Right (04/12/2022); IR US Guide Vasc Access Left (03/19/2022); IR LYMPHANGIOGRAM PEL/ABD BILAT (04/08/2022); IR Fluoro Guide CV Line Right (04/04/2022); and Radiology with anesthesia (N/A, 04/10/2022).  Significant Hospital Events: Including procedures, antibiotic start and stop dates in addition to other pertinent events   6/2 Presented [facility] ER unresponsive, intubated, shock on pressors > to [facility].  CT chest/abd w/ extensive LAN 6/3 Right chest tube placed. Left thora 2000 ml removed - milky 6/4 Extubated, off pressors.  Pleural fluid exudative, cx negative, lymphocytic. 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 Started on pulse dose steroids '50mg'$  pred.  6/7 Chest tube output over a liter 6/8 Left thoracentesis for 1400 cc of fluid 6/10 Chest tube removed 6/12 S/p right chest tube placement with 3.1 L drained 6/3 Tx from [facility] to [facility] 6/13 for initiation of palliative radiotherapy 6/14 No chest pain. Saturation 96% on 2 L. Chest tube drained 190 cc of milky fluid 6/15 Fell. CXR stable. Doing well. Got SIM for XRT.  RA.  Mod-large effusion on L. Refused CT. Octreotide. 6/16 TPN.  On [place] O2 . R CT 1000 cc out.  Started XRT 6/15, rituximab is being considered 6/17 CT issues with placement. Not sure if she wants new chest tube.  6/18 Dislodged right chest tube completely.  6/19 PleurX deferred due to poor candidate outpatient. Bilateral chest tubes placed due to worsening respiratory distress.  6/20 - new onset hypotension after bilateral  chest tube and midodrine started 6/21 2.8L out from CT in last 24 hours. IR consulted for thoracic duct embolization. Per IR: Lymphangiogram with thoracic duct embolization is not a routine procedure and will need to see when this procedure could potentially be performed 6/24-continues with significant output from chest tubes 6/25 - Afebrile .decreased chest tube output 6/26 - walked with PT in my presene.Bilateral chest tubes with draining chylothorax. IR uncertain (01/09/05) being STARTED SIROLIMUS  6/27- Last 90 min > 50cc drain of chyle on left sidde. Rt side - hardly any ouput.  Growing consensus that thoracic duct ligation is indicated based on curbside with Dr Kipp Brood surgeon and also running case by surgeon in [place], Dr Lorenso Courier of oncology. Started on sirolimus yesterday, She is eating, ambulating and also on TPN. No fevers D/wDr McCullohgh of IR - he has experience with TDE and will plan for procedure at [facility] or [facility] next few to several day. Needs General and a 6h block of time Patient agreeable for  TDE RT chest tube not draining well - new minor fissure fluid collection 6/28 -overnight dyspneic . Now on 3L [place]. LEft chst tube is leaking. Rt chest tube not much drain. TPN ongoing. Good appetie. IR considering TDE 03/31/2022 - patient aware 6/29 TPN stopped, doing calorie count. CT CAP with large bl effusions and tubes in ok position 6/30 tPA in left chest tube with good output 7/1 restarted TPN - not meeting caloric needs and wish to give her every shot possible at pleurodesis following TDE on 7/3. tPA in right chest tube 7/3 underwent TDE. Encephalopathic and increased WOB after procedure, placed on HHFNC. Dislodged left chest tube. 7/4 left chest tube replaced  7/5 2nd dose of tPA to right chest tube 7/6 3rd dose of tPA to right chest tube, 2nd dose of tPA to left chest tube 7/8 talc pleurodesis 7/10 right chest tube dislodged 7/9, chest x-ray appears stable from prior 7/11 decreased  chest tube output, left-sided chest tube was discontinued  Interim History / Subjective:  Tolerated talc pleurodesis well on 7/8.  Chest tube on right dislodged 7/10 7/11 left-sided chest tube discontinued Had a rough night, altered mental status, increased work of breathing, hypotension  Objective   Blood pressure (!) 40/24, pulse (!) 45, temperature 97.9 F (36.6 C), temperature source Axillary, resp. rate (!) 43, height '5\' 5"'$  (1.651 m), weight 55.2 kg, last menstrual period 07/25/2016, SpO2 (!) 79 %.        Intake/Output Summary (Last 24 hours) at 2022/04/12 0826 Last data filed at April 12, 2022 0738 Gross per 24 hour  Intake 3194.99 ml  Output 200 ml  Net 2994.99 ml   Filed Weights   03/21/22 0425 03/22/22 0447 03/23/22 0500  Weight: 54.4 kg 52 kg 55.2 kg    I/O last 3 completed shifts: In: 2743.6 [I.V.:2643.6; IV Piggyback:100] Out: 230 [Urine:200; Chest Tube:30]  Intake/Output      07/11 0701 07/12 0700 07/12 0701 07/13 0700   I.V. (mL/kg) 1530.6 (27.7) 893.7 (16.2)   IV Piggyback 50 720.7   Total Intake(mL/kg) 1580.6 (28.6) 1614.4 (29.2)   Urine (mL/kg/hr) 200 (0.2)    Chest Tube     Total Output 200    Net +1380.6 +1614.4        Urine Occurrence 1 x     Physical exam: Chronically ill-appearing  On BiPAP Poor air entry bilaterally Bowel sounds appreciated  CT abdomen reviewed with evidence of anasarca, ascites, bilateral pleural effusion Gas in the left-sided effusion  ABG with hypercapnic respiratory failure Elevated BUN at 54 Phosphorus 6.8 White count improved to 11.3, hematocrit of 30.6  Assessment & Plan:   Acute hypoxemic respiratory failure Acute hypercapnic respiratory failure CLL/SLL cell with unchanged bulky adenopathy and bilateral pleural effusions -Thoracentesis on 02/15/2022 showed atypical lymphocytes suspicious for lymphoproliferative process.  Right axillary node biopsies on 02/18/2022 showing B-cell consistent with a history of lymphoma  and CLL.  She is s/p thoracic duct embolization 7/3 with IR -On octreotide since 02/25/2022 -On TPN 02/25/2022-03/13/2022, 7/1- -On prednisone 50 mg daily started 02/18/2022 -On sirolimus started 03/10/2022 -Maintain oximetry above 94% -S/p bilateral talc pleurodesis 7/8 -Bilateral chest tubes out  Hypotension -Was on midodrine  With decompensation -Appreciate palliative care involvement -Patient status switched to comfort measures  Severe protein calorie malnutrition Failure to thrive -Was on TPN  Pain control Appreciate palliative care involvement All active measures will be discontinued and replaced by comfort measures  Sherrilyn Rist, MD [place] [facility] Pager: See Shea Evans

## 2022-04-15 NOTE — Progress Notes (Signed)
eLink Physician-Brief Progress Note Patient Name: [name] DOB: 06/24/1961 MRN: [id number]   Date of Service  2022/04/19  HPI/Events of Note  Patient still reports 10/10 abdominal pain in spite of NGT to LIS and Dilaudid, Fentanyl, Ativan and Mylicon. She reports no relief with IV narcotics.   eICU Interventions  Plan: Send AM Labs now. Amylase, Lipase and Lactic Acid level now.  CT Scan of Abdomen and Pelvis with contrast STAT.     Intervention Category Major Interventions: Other:  Lysle Dingwall 04-19-2022, 3:50 AM

## 2022-04-15 NOTE — Progress Notes (Signed)
25m dilaudid wasted into the Stericycle by CCarMaxRN and CVickie EpleyRN

## 2022-04-15 NOTE — Death Summary Note (Signed)
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 Secondary Diagnoses (including complications and co-morbidities):  Principal Problem:   Bilateral chylothorax Active Problems:   Essential hypertension   Acute respiratory failure with hypoxia (HCC)   CLL (chronic lymphocytic leukemia) (HCC)   Septic shock (HCC)   Chronic diastolic heart failure (HCC)   Protein-calorie malnutrition, severe   Pressure injury of skin   Hyperkalemia   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 year old female who was admitted to the hospital with complaints of abdominal pain, altered mental status Became hemodynamically unstable and required intubation and pressors, transferred to [facility] for further evaluation and management Found to have large bilateral effusions for which she had a chest tube placed initially on 6/3 on the right, had a thoracentesis on the left-both effusions significant for chylothorax Successfully extubated 6/4 6/6-had axillary lymph node biopsy-showed B-cell lymphoma, lymphocytic leukemia -Steroid therapy initiated -Sirolimus  started 6/26 Continues to have difficulty with chylothorax with significant fluid accumulation bilaterally for which she had multiple thoracentesis Also had multiple chest tube placed over the course of hospitalization Was started on octreotide to try and decrease the output from her chest tube from the chylothorax. Had lymph angiogram performed with thoracic duct embolization on 7/3. Bilateral talc pleurodesis on 7/8 Chest tube dislodged 7/9 on the right, left chest tube was removed 7/11 Patient continued to do poorly and suffered worsening respiratory compromise Was ultimately made a comfort measures only with a significant deterioration, failure to thrive, severe protein calorie malnutrition pain and discomfort  Patient succumbed to her illness 04-25-2022 -Cause of death is chronic lymphocytic leukemia, small cell B-cell lymphoma  Pertinent Labs and Studies  Significant Diagnostic Studies CT ABDOMEN PELVIS W CONTRAST  Result Date: 04-25-22 CLINICAL DATA:  61 year old female with history of acute onset of nonlocalized abdominal pain. History of chronic lymphocytic leukemia (CLL). * Tracking Code: BO * EXAM: CT ABDOMEN AND PELVIS WITH CONTRAST TECHNIQUE: Multidetector CT imaging of the abdomen and pelvis was performed using the standard protocol following bolus administration of intravenous contrast. RADIATION DOSE REDUCTION: This exam was performed according to the departmental dose-optimization program which includes automated exposure control, adjustment of the mA and/or kV according to patient size and/or use of iterative reconstruction technique. CONTRAST:  68m OMNIPAQUE IOHEXOL 300 MG/ML  SOLN COMPARISON:  CT the abdomen and pelvis 03/13/2022. FINDINGS: Lower chest: Complex loculated pleural fluid collections bilaterally, with gas and several of the loculations on the left, highly concerning for bilateral empyemas (although the gas in the left hemithorax could be related to previously noted  pigtail drainage catheter, which appears to have been removed). Visualized portions of the  lungs demonstrate widespread interstitial prominence and patchy opacities which likely represent multifocal infection, most evident in the lingula where there is a nodular density measuring 2.0 x 1.3 cm which is new compared to the prior study (axial image 6 of series 2). Atherosclerotic calcifications in the descending thoracic aorta. Distal esophagus is dilated. Central venous catheter terminating in the right atrium. Bulky lymphadenopathy along the chest wall bilaterally, with the largest lymph node along the lateral left chest wall measuring up to 2 cm in diameter (axial image 4 of series 2). Hepatobiliary: Liver has a slightly shrunken appearance and nodular contour, suggesting a background of cirrhosis. No discrete cystic or solid hepatic lesions. No intra or extrahepatic biliary ductal dilatation. Status post cholecystectomy. Pancreas: No pancreatic mass. No pancreatic ductal dilatation. No focal peripancreatic fluid collections Spleen: Unremarkable. Adrenals/Urinary Tract: Multiple low-attenuation lesions are noted in both kidneys, most of which are too small to definitively characterize, but statistically likely to represent tiny cysts (no imaging follow-up recommended). The largest of these lesions is a simple cyst measuring up to 1.3 cm in the lower pole of the right kidney. No aggressive appearing renal lesions. No hydroureteronephrosis. Urinary bladder is unremarkable in appearance. Bilateral adrenal glands are normal in appearance. Stomach/Bowel: Stomach wall appears thickened and edematous. No pathologic dilatation of small bowel or colon. Appendix is not confidently identified may be surgically absent. Vascular/Lymphatic: Aortic atherosclerosis, without definite aneurysm or dissection in the abdominal or pelvic vasculature. Extensive lymphadenopathy again noted throughout the abdomen and pelvis, most evident  along the pelvic sidewalls bilaterally and in the retroperitoneum. When compared to the recent prior examination, these lymph nodes now contain high attenuation material, presumably from lipiodol embolization (performed on 04/06/2022). Overall bulk of lymphadenopathy appears very similar to the pre embolization scan, with the largest of these lymph nodes in the right external iliac nodal station measuring up to 2.9 cm in short axis (axial image 72 of series 2). Another prominent low left para-aortic lymph node (axial image 50 of series 2) measures 2 cm in short axis. Reproductive: Uterus and ovaries are unremarkable in appearance. Other: Increasing moderate to large volume of ascites. No pneumoperitoneum. Diffuse body wall edema. Musculoskeletal: There are no aggressive appearing lytic or blastic lesions noted in the visualized portions of the skeleton. IMPRESSION: 1. Postprocedural changes of lipiodol embolization from recent bilateral abdominopelvic lymphangiogram (performed 03/18/2022). Bulky lymphadenopathy throughout the abdomen and pelvis appears grossly similar to the recent prior study, with exception of new high attenuation material within the lymphatic system. 2. Increased moderate to large volume of ascites. Diffuse body wall edema. Bilateral pleural effusions. Overall, imaging findings are indicative of a state of anasarca. 3. Interval development of gas within the complex left-sided pleural fluid collections, which could be iatrogenic in the setting of recent catheter removal, however, clinical correlation for signs and symptoms of empyema is recommended. 4. Persistent bulky lymphadenopathy along the chest wall bilaterally (left-greater-than-right), as above. 5. Morphologic changes in the liver suggesting cirrhosis. 6. Aortic atherosclerosis. Electronically Signed   By: Vinnie Langton M.D.   On: 03-29-2022 05:45   CT HEAD WO CONTRAST (5MM)  Result Date: 2022/03/29 CLINICAL DATA:  61 year old  female with altered mental status, no longer responding. Status post thoracic duct embolization on 04/10/2022 for bilateral chylothorax. History of left MCA bifurcation aneurysm treated. EXAM: CT HEAD WITHOUT CONTRAST TECHNIQUE: Contiguous axial images were obtained from the base of the skull through the vertex without intravenous contrast. RADIATION DOSE REDUCTION: This exam was  performed according to the departmental dose-optimization program which includes automated exposure control, adjustment of the mA and/or kV according to patient size and/or use of iterative reconstruction technique. COMPARISON:  [facility] head CT 03/07/2022. FINDINGS: Brain: Cerebral volume remains normal. No ventriculomegaly. No acute intracranial hemorrhage identified. No cortically based acute infarct identified. No intracranial mass effect. Normal basilar cisterns. Small oval 8 mm hyperdense area along the posterior columns of the fornix suspicious for small colloid cyst appears stable from last month. No regional edema or mass effect. Calcified and dural based left superior convexity extra-axial appearing mass up to 14 mm diameter also appears stable (coronal image 23) with mild mass effect on the left superior frontal gyrus but no cerebral edema identified. Left middle cranial fossa embolization coil pack with streak artifact is stable. Patchy bilateral subcortical white matter hypodensity is stable. Vascular: Mild Calcified atherosclerosis at the skull base. Left MCA region embolization coil pack appears stable from last month. Skull: No acute osseous abnormality identified. Sinuses/Orbits: Visualized paranasal sinuses and mastoids are clear. Other: Right nasoenteric tube in place. Mild bubbly opacity in the nasopharynx. Visualized orbits and scalp soft tissues are within normal limits. IMPRESSION: 1. No acute intracranial abnormality. 2. 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 No associated cerebral edema. 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 No ventriculomegaly. 3. Nasoenteric tube in place. Electronically Signed   By: Genevie Ann M.D.   On: April 16, 2022 05:14   DG Abd Portable 1V  Result Date: 2022-04-16 CLINICAL DATA:  NG tube placement EXAM: PORTABLE ABDOMEN - 1 VIEW COMPARISON:  03/25/2022 FINDINGS: NG tube is in the stomach. Dilated air-filled stomach again noted, unchanged. Prior cholecystectomy. IMPRESSION: NG tube in the stomach. Electronically Signed   By: Rolm Baptise M.D.   On: 04/16/2022 00:44   DG Abd Portable 1V  Result Date: 03/25/2022 CLINICAL DATA:  Abdominal pain and distension. EXAM: PORTABLE ABDOMEN - 1 VIEW COMPARISON:  Abdominal x-ray 03/19/2022. CT chest abdomen and pelvis 03/13/2022. FINDINGS: Contrast is again seen from recent lymphangiogram similar to the prior study. The stomach is dilated and air-filled. Otherwise, no dilated bowel loops are seen. There are surgical clips in the right abdomen. No acute fractures are seen. IMPRESSION: 1. Dilated air-filled stomach may represent gastric outlet obstruction or gastroparesis. 2. Contrast from recent lymphangiogram. Electronically Signed   By: Ronney Asters M.D.   On: 03/25/2022 23:36   DG CHEST PORT 1 VIEW  Result Date: 03/25/2022 CLINICAL DATA:  Chest tube EXAM: PORTABLE CHEST 1 VIEW COMPARISON:  03/23/2022 FINDINGS: Left basilar chest tube remains in place. Stable cardiomediastinal contours. Unchanged high density material in the paraspinal region related to lymphangiogram. Persistent bilateral pleural effusions with loculated fissural fluid. Similar to slightly worsened bibasilar airspace opacities. No pneumothorax. IMPRESSION: Persistent bilateral pleural effusions and bibasilar airspace opacities, similar to slightly worsened from prior. Electronically Signed   By: Davina Poke D.O.   On: 03/25/2022 09:01   DG CHEST PORT 1  VIEW  Result Date: 03/23/2022 CLINICAL DATA:  Dislodgement of chest 2 EXAM: PORTABLE CHEST 1 VIEW COMPARISON:  03/23/2022, CT 03/20/2022 FINDINGS: Left lung base chest tube remains in place. Right-sided chest tube has been removed. Similar pleural effusions and dense airspace disease at left base. Oval opacity in the right mid to lower lung probably represents pseudotumor. Diffuse hazy appearance of the lungs which may be due to  the presence of pleural effusion and or edema. There is cardiomegaly. Aortic atherosclerosis. Radiodense material along the spinal region as before. IMPRESSION: 1. Similar position of the left-sided chest tube, right-sided chest tube has been removed. 2. No great change in bilateral pleural effusions and probable loculated fluid at the right fissure. No change dense left lung base consolidation. Generalized hazy appearance of the lung parenchyma could be due to pleural fluid and or underlying edema. Electronically Signed   By: Donavan Foil M.D.   On: 03/23/2022 23:11   DG CHEST PORT 1 VIEW  Result Date: 03/23/2022 CLINICAL DATA:  Chest tube placement. EXAM: PORTABLE CHEST 1 VIEW COMPARISON:  Radiograph and chest CT 03/20/2022 FINDINGS: Bilateral pigtail catheter is in place projecting over the lung basis. Persistent left greater than right pleural effusions. Mild Q late it fluid in the right minor fissure. Rounded density in the left upper lung zone may represent fluid in the left inter lobar fissure when compared with prior CT. Likely an element of layering pleural fluid with hazy opacities throughout both lungs. Stable heart size and mediastinal contours. No pneumothorax. IMPRESSION: 1. Persistent left greater than right pleural effusions with bilateral pigtail chest tubes in place. 2. Persistent fluid in the right minor fissure. Rounded density in the left upper lung zone likely represents fluid in the left interlobar fissure when compared with prior CT. Electronically Signed   By:  Keith Rake M.D.   On: 03/23/2022 11:26   CT CHEST WO CONTRAST  Result Date: 03/20/2022 CLINICAL DATA:  Bilateral chylothorax. Bilateral chest tubes in place status post fibrinolysis. Patient underwent lymphangiogram and thoracic duct embolization on 03/16/2022. EXAM: CT CHEST WITHOUT CONTRAST TECHNIQUE: Multidetector CT imaging of the chest was performed following the standard protocol without IV contrast. RADIATION DOSE REDUCTION: This exam was performed according to the departmental dose-optimization program which includes automated exposure control, adjustment of the mA and/or kV according to patient size and/or use of iterative reconstruction technique. COMPARISON:  Multiple radiographs, most recently done 03/20/2022. CT 03/13/2022. FINDINGS: Cardiovascular: Left arm PICC projects to the inferior aspect of the right atrium. Atherosclerosis of the aorta, great vessels and coronary arteries. A small pericardial effusion appears minimally enlarged in the interval. The heart size is normal. Mediastinum/Nodes: Bulky supraclavicular and axillary adenopathy bilaterally appears similar to the previous study. Mediastinal and hilar assessment is limited by the lack of intravenous contrast and paucity of mediastinal fat. No progressive mediastinal adenopathy identified. There is high density within the posterior mediastinum attributed to the recent lymphangiogram. Lungs/Pleura: Bilateral pigtail catheters remain in place inferiorly. The volume of the bilateral pleural effusions has substantially decreased from the prior chest CT. There are residual loculated components posteriorly and inferiorly on the right. On the left, the large loculated components are within the fissure and superiorly. No evidence of pneumothorax. Interval improved aeration of both lung bases with residual bibasilar airspace opacities. Underlying emphysema and central airway thickening. Upper abdomen: Increased volume of ascites within the  upper abdomen. High density in the retroperitoneum attributed to recent lymphangiogram. Musculoskeletal/Chest wall: There is no chest wall mass or suspicious osseous finding. IMPRESSION: 1. Significant improvement in the bilateral pleural effusions compared with previous CT of 1 week ago. There are residual loculated components bilaterally as described above. 2. Resulting improvement in both lung bases with residual bibasilar airspace opacities. 3. New moderate ascites within the visualized upper abdomen. 4. Minimal enlargement of a small pericardial effusion. 5. Sequela of recent lymphangiogram. 6. Grossly stable underlying thoracic  inlet and axillary adenopathy attributed to underlying chronic lymphocytic leukemia. Correlate clinically. 7. Coronary and aortic atherosclerosis (ICD10-I70.0). Emphysema (ICD10-J43.9). Electronically Signed   By: Richardean Sale M.D.   On: 03/20/2022 08:49   DG CHEST PORT 1 VIEW  Result Date: 03/20/2022 CLINICAL DATA:  Pleural effusion, bilateral chest tubes EXAM: PORTABLE CHEST 1 VIEW COMPARISON:  03/19/2022 FINDINGS: Bibasilar chest tubes. Left PICC line is unchanged. Decreased pleural effusions. Improved lung aeration. Heart size is within normal limits. IMPRESSION: Decreased pleural effusions with improved lung aeration. Electronically Signed   By: Macy Mis M.D.   On: 03/20/2022 08:01   DG Abd 1 View  Result Date: 03/19/2022 CLINICAL DATA:  Abdominal distension and discomfort. Recent lymphangiogram. EXAM: ABDOMEN - 1 VIEW COMPARISON:  04/04/2022 FINDINGS: Contrast noted from recent lymphangiogram. There is also contrast in the colon. Bilateral pleural drainage catheters are noted. Scattered air in the stomach, small bowel and colon but no findings for small bowel obstruction or free air. IMPRESSION: Nonobstructive bowel gas pattern. Contrast from recent lymphangiogram. Electronically Signed   By: Marijo Sanes M.D.   On: 03/19/2022 12:52   DG CHEST PORT 1  VIEW  Addendum Date: 03/19/2022   ADDENDUM REPORT: 03/19/2022 09:00 ADDENDUM: There is a kink in the left-sided chest tube at the site of the entry into the pleural space. Electronically Signed   By: Frazier Richards M.D.   On: 03/19/2022 09:00   Result Date: 03/19/2022 CLINICAL DATA:  142230 pleural effusions, bilateral chest tubes. EXAM: PORTABLE CHEST 1 VIEW COMPARISON:  March 18, 2022 FINDINGS: The heart size and mediastinal contours are stable. Again seen is the left arm PICC line with its tip in the right atrium. Bilateral chest tubes are stable. Again seen is the opacification in the upper 2/3 of the left hemithorax likely due to the loculated pleural effusion and is stable to mildly increased in the interim. The opacity in the mid region of the right hemithorax has increased in the interim of likely increased fluid in the fissure. Stable radiopaque densities of likely related to recent thoracic duct embolization. The visualized skeletal structures are unremarkable. IMPRESSION: Likely loculated pleural effusion in the upper 2/3 of the left hemithorax is stable to mildly increased in the interim. Loculated effusion likely in the right fissure has increased in the interim. Electronically Signed: By: Frazier Richards M.D. On: 03/19/2022 08:04   DG CHEST PORT 1 VIEW  Result Date: 03/18/2022 CLINICAL DATA:  Chest tube in place. EXAM: PORTABLE CHEST 1 VIEW COMPARISON:  Chest radiograph earlier today, CT 03/13/2022 FINDINGS: Upper extremity PICC tip in the right atrium. Right basilar pigtail catheter. This is unchanged in position. There is a new pigtail catheter projecting over the lower left lung base. Persistent hazy bilateral lung opacities. Subtotal opacification of the 1/2 of left hemithorax, may represent loculated pleural effusion or lobar collapse. Rounded density in the right mid lung is favored to represent fluid in the fissure. Radiopaque densities projecting over the retroperitoneum and mediastinum likely  related to recent thoracic duct embolization. No visible pneumothorax IMPRESSION: 1. New pigtail catheter projecting over the lower left lung base. Stable positioning of right pigtail catheter. 2. Progressive opacification of the upper left hemithorax which may be due to loculated pleural effusion or lobar collapse/consolidation. Rounded density in the right mid lung favors to be fluid in the fissure. Persistent hazy basilar opacities likely subpulmonic effusions. 3. No pneumothorax. Electronically Signed   By: Keith Rake M.D.   On: 03/18/2022 19:20  DG CHEST PORT 1 VIEW  Result Date: 03/18/2022 CLINICAL DATA:  Status post removal of left chest tube. EXAM: PORTABLE CHEST 1 VIEW COMPARISON:  03/16/2022 FINDINGS: There is a left arm PICC line with tip in the right atrium. Stable cardiomediastinal contours. Interval removal of left chest tube. No pneumothorax identified. Right basilar chest tube is again noted. Interval increase opacification overlying both lungs is identified compared with 03/16/2022 which may reflect progressive bilateral pleural effusions, airspace disease and or atelectasis. IMPRESSION: 1. No pneumothorax status post left chest tube removal. 2. Increase opacification of both lungs compared with 03/16/2022 which may reflect progressive bilateral pleural effusions, airspace disease and/or atelectasis. Electronically Signed   By: Kerby Moors M.D.   On: 03/18/2022 05:15   IR EMBO VENOUS NOT HEMORR HEMANG  INC GUIDE ROADMAPPING  Result Date: 03/28/2022 INDICATION: 61 year old female with chronic lymphocytic leukemia and large volume bilateral chylous pleural effusions. She presents for lymphangiogram and thoracic duct embolization. EXAM: IR EMBO VENOUS NOT HEMORR HEMANG INC GUIDE ROADMAPPING; PICC REPLACEMENT UNDER ULTRASOUND; IR RIGHT FLUORO GUIDE CV LINE; IR ULTRASOUND GUIDANCE VASC ACCESS LEFT; LYMPHANGIOGRAM; IR ULTRASOUND GUIDANCE VASC ACCESS RIGHT 1. Ultrasound-guided placement of  central venous catheter via right common femoral vein 2. Ultrasound-guided puncture right superficial inguinal lymph node 3. Ultrasound-guided puncture left superficial inguinal lymph node 4. Bilateral lymphangiogram 5. Balloon occluded embolization of thoracic duct 6. Left upper extremity PICC exchange MEDICATIONS: 2 g cefoxitin. The antibiotic was administered within 1 hour of the procedure ANESTHESIA/SEDATION: General endotracheal anesthesia provided by the anesthesiology service. CONTRAST:  63m OMNIPAQUE IOHEXOL 300 MG/ML  SOLN FLUOROSCOPY: Radiation Exposure Index (as provided by the fluoroscopic device): 4643mGy Kerma COMPLICATIONS: None immediate. PROCEDURE: Informed consent was obtained from the patient following explanation of the procedure, risks, benefits and alternatives. The patient understands, agrees and consents for the procedure. All questions were addressed. A time out was performed prior to the initiation of the procedure. Maximal barrier sterile technique utilized including caps, mask, sterile gowns, sterile gloves, large sterile drape, hand hygiene, and Betadine prep. The right common femoral vein was interrogated with ultrasound and found to be widely patent. An image was obtained and stored for the medical record. Local anesthesia was attained by infiltration with 1% lidocaine. A small dermatotomy was made. Under real-time sonographic guidance, the vessel was punctured with a 21 gauge micropuncture needle. Using standard technique, the initial micro needle was exchanged over a 0.018 micro wire for a transitional 4 [place] sheath. The micro sheath was then exchanged over a 0.035 wire for a fascial dilator and the soft tissue tract was dilated. An aero triple-lumen central venous catheter was then advanced over the wire and position with the tip in the right common iliac vein. The catheter flushed and aspirated easily. The catheter was capped and secured in place. Ultrasound was used to  interrogate the right groin. A large pathologic lymph node with what appears to be a visible lymphatic channel was identified. Under real-time ultrasound guidance, a 25 gauge spinal needle was advanced into the vascular channel. Gentle did injection of contrast under fluoroscopy demonstrates a blob like architecture. This is not consistent with lymphatic flow. Therefore, a smaller and more normal appearing lymph nodes slightly more inferior was identified. Using the same technique, the hilum of the lymph node was punctured with a 25 gauge spinal needle. A gentle injection of Lipiodol under fluoroscopy demonstrates successful filling of lymphatic channels. The needle was secured in place with Tegaderm. Attention was now  turned to the contralateral groin. Using the same technique, a lymph node on the left was successfully accessed with ultrasound guidance. Lipiodol injection was performed under fluoroscopy. There was some extension of injected Lipiodol into the central venous system. Therefore, this lymph node was abandoned. A second lymph node was targeted and punctured. This time upon the hiatal injection there is normal opacification of the lymphatic system. Lipiodol was then slowly injected through both accessed superficial inguinal lymph nodes and a diagnostic lymphangiogram was performed. Numerous enlarged pathologic lymph nodes are identified bilaterally along the pelvic side chains and throughout the retroperitoneum. Ultimately, there was coalescence of the lymphatic system into the thoracic duct at the level of L2-L3. Once the thoracic duct was well visualized, angulation was performed to separate the atherosclerotic wall of the aorta from the thoracic duct. Attempts were then made to puncture the duct using a combination of different needles. After several attempts, catheterization was not successful. Therefore, the decision was made to attempt access of the thoracic duct at its insertion into the left  subclavian vein. The patient's existing PICC line was cut and removed over a wire. A 5 French sheath was advanced into the left upper arm vein. Attempts were then made to catheterize the insertion of the thoracic duct using a combination of angiographic catheters and micro catheters. After a prolonged period, further attempts at this approach were abandoned. Attention was again turned to the thoracic duct which had re-opacified with Lipiodol. A 21 gauge trocar style needle was employed and used to successfully puncture the duct. A transcend wire was advanced up the duct. An initial attempt to advanced the Progreat catheter into the thoracic duct was not successful. Therefore, a 0.014 CXI catheter was selected and successfully advanced over the wire and into the thoracic duct. The catheter and wire combination were advanced as far into the duct is possible. Ultimately, a Glidewire Advantage wire was parked in the duct and the CXI catheter exchange for a 2.4 [place] Progreat catheter. The Progreat catheter was then navigated more distally into the duct to the level of the lower thoracic spine. Additional lymphadenopathy utilizing direct contrast injection demonstrates a region where the main cystic duct is obliterated by pathologic lymphadenopathy. The branches ram if I into dozens of small thread like branches which then reconstitutes the main thoracic duct. Efforts were made to find a passage through this Maze of small vessels, but this was not successful. At this point, the decision was made to proceed with embolization from the given location. As we had no backstop off coils to prevent embolization through the thoracic duct into the subclavian vein, we decided to place an occlusion balloon in the left subclavian vein. A Bentson wire was advanced through the left arm access into the right heart. A 12 x 40 mm mustang balloon was advanced over the wire and positioned across the origin of the thoracic duct. The balloon  was inflated to full effacement. True fill liquid embolic glue was then reconstituted in Lipiodol at a concentration of 9:1. The glue mixture was then carefully injected through the Progreat catheter in till there was successful antegrade filling of nearly the entirety of the thoracic duct. The glue was taken up to the level of the aortic arch almost to the inferior aspect of the clavicle. No further injection was performed to prevent reflux into the subclavian vein. The microcatheter was then pulled back and the remaining thoracic duct embolized with glue. The catheter was then withdrawn. After several  minutes the subclavian venous balloon was deflated and removed. A new dual lumen power injectable PICC was cut to 43 cm and advanced over the wire and position with the tip at the cavoatrial junction. The catheter flushes and aspirates easily. The catheter was capped and secured to the skin with an adhesive fixation device. IMPRESSION: 1. Successful placement of a right common femoral central venous catheter using ultrasound and fluoroscopic guidance. 2. Successful ultrasound-guided puncture of bilateral superficial inguinal lymph nodes with bilateral abdominopelvic lymphangiogram. 3. Successful liquid embolic embolization of the thoracic duct using true fill glue. 4. Exchange of left upper extremity PICC. Electronically Signed   By: Jacqulynn Cadet M.D.   On: 03/23/2022 15:47   IR US Guide Vasc Access Right  Result Date: 03/19/2022 INDICATION: 61 year old female with chronic lymphocytic leukemia and large volume bilateral chylous pleural effusions. She presents for lymphangiogram and thoracic duct embolization. EXAM: IR EMBO VENOUS NOT HEMORR HEMANG INC GUIDE ROADMAPPING; PICC REPLACEMENT UNDER ULTRASOUND; IR RIGHT FLUORO GUIDE CV LINE; IR ULTRASOUND GUIDANCE VASC ACCESS LEFT; LYMPHANGIOGRAM; IR ULTRASOUND GUIDANCE VASC ACCESS RIGHT 1. Ultrasound-guided placement of central venous catheter via right common  femoral vein 2. Ultrasound-guided puncture right superficial inguinal lymph node 3. Ultrasound-guided puncture left superficial inguinal lymph node 4. Bilateral lymphangiogram 5. Balloon occluded embolization of thoracic duct 6. Left upper extremity PICC exchange MEDICATIONS: 2 g cefoxitin. The antibiotic was administered within 1 hour of the procedure ANESTHESIA/SEDATION: General endotracheal anesthesia provided by the anesthesiology service. CONTRAST:  51m OMNIPAQUE IOHEXOL 300 MG/ML  SOLN FLUOROSCOPY: Radiation Exposure Index (as provided by the fluoroscopic device): 4350mGy Kerma COMPLICATIONS: None immediate. PROCEDURE: Informed consent was obtained from the patient following explanation of the procedure, risks, benefits and alternatives. The patient understands, agrees and consents for the procedure. All questions were addressed. A time out was performed prior to the initiation of the procedure. Maximal barrier sterile technique utilized including caps, mask, sterile gowns, sterile gloves, large sterile drape, hand hygiene, and Betadine prep. The right common femoral vein was interrogated with ultrasound and found to be widely patent. An image was obtained and stored for the medical record. Local anesthesia was attained by infiltration with 1% lidocaine. A small dermatotomy was made. Under real-time sonographic guidance, the vessel was punctured with a 21 gauge micropuncture needle. Using standard technique, the initial micro needle was exchanged over a 0.018 micro wire for a transitional 4 [place] sheath. The micro sheath was then exchanged over a 0.035 wire for a fascial dilator and the soft tissue tract was dilated. An aero triple-lumen central venous catheter was then advanced over the wire and position with the tip in the right common iliac vein. The catheter flushed and aspirated easily. The catheter was capped and secured in place. Ultrasound was used to interrogate the right groin. A large  pathologic lymph node with what appears to be a visible lymphatic channel was identified. Under real-time ultrasound guidance, a 25 gauge spinal needle was advanced into the vascular channel. Gentle did injection of contrast under fluoroscopy demonstrates a blob like architecture. This is not consistent with lymphatic flow. Therefore, a smaller and more normal appearing lymph nodes slightly more inferior was identified. Using the same technique, the hilum of the lymph node was punctured with a 25 gauge spinal needle. A gentle injection of Lipiodol under fluoroscopy demonstrates successful filling of lymphatic channels. The needle was secured in place with Tegaderm. Attention was now turned to the contralateral groin. Using the same technique, a lymph  node on the left was successfully accessed with ultrasound guidance. Lipiodol injection was performed under fluoroscopy. There was some extension of injected Lipiodol into the central venous system. Therefore, this lymph node was abandoned. A second lymph node was targeted and punctured. This time upon the hiatal injection there is normal opacification of the lymphatic system. Lipiodol was then slowly injected through both accessed superficial inguinal lymph nodes and a diagnostic lymphangiogram was performed. Numerous enlarged pathologic lymph nodes are identified bilaterally along the pelvic side chains and throughout the retroperitoneum. Ultimately, there was coalescence of the lymphatic system into the thoracic duct at the level of L2-L3. Once the thoracic duct was well visualized, angulation was performed to separate the atherosclerotic wall of the aorta from the thoracic duct. Attempts were then made to puncture the duct using a combination of different needles. After several attempts, catheterization was not successful. Therefore, the decision was made to attempt access of the thoracic duct at its insertion into the left subclavian vein. The patient's existing  PICC line was cut and removed over a wire. A 5 French sheath was advanced into the left upper arm vein. Attempts were then made to catheterize the insertion of the thoracic duct using a combination of angiographic catheters and micro catheters. After a prolonged period, further attempts at this approach were abandoned. Attention was again turned to the thoracic duct which had re-opacified with Lipiodol. A 21 gauge trocar style needle was employed and used to successfully puncture the duct. A transcend wire was advanced up the duct. An initial attempt to advanced the Progreat catheter into the thoracic duct was not successful. Therefore, a 0.014 CXI catheter was selected and successfully advanced over the wire and into the thoracic duct. The catheter and wire combination were advanced as far into the duct is possible. Ultimately, a Glidewire Advantage wire was parked in the duct and the CXI catheter exchange for a 2.4 [place] Progreat catheter. The Progreat catheter was then navigated more distally into the duct to the level of the lower thoracic spine. Additional lymphadenopathy utilizing direct contrast injection demonstrates a region where the main cystic duct is obliterated by pathologic lymphadenopathy. The branches ram if I into dozens of small thread like branches which then reconstitutes the main thoracic duct. Efforts were made to find a passage through this Maze of small vessels, but this was not successful. At this point, the decision was made to proceed with embolization from the given location. As we had no backstop off coils to prevent embolization through the thoracic duct into the subclavian vein, we decided to place an occlusion balloon in the left subclavian vein. A Bentson wire was advanced through the left arm access into the right heart. A 12 x 40 mm mustang balloon was advanced over the wire and positioned across the origin of the thoracic duct. The balloon was inflated to full effacement. True  fill liquid embolic glue was then reconstituted in Lipiodol at a concentration of 9:1. The glue mixture was then carefully injected through the Progreat catheter in till there was successful antegrade filling of nearly the entirety of the thoracic duct. The glue was taken up to the level of the aortic arch almost to the inferior aspect of the clavicle. No further injection was performed to prevent reflux into the subclavian vein. The microcatheter was then pulled back and the remaining thoracic duct embolized with glue. The catheter was then withdrawn. After several minutes the subclavian venous balloon was deflated and removed. A new  dual lumen power injectable PICC was cut to 43 cm and advanced over the wire and position with the tip at the cavoatrial junction. The catheter flushes and aspirates easily. The catheter was capped and secured to the skin with an adhesive fixation device. IMPRESSION: 1. Successful placement of a right common femoral central venous catheter using ultrasound and fluoroscopic guidance. 2. Successful ultrasound-guided puncture of bilateral superficial inguinal lymph nodes with bilateral abdominopelvic lymphangiogram. 3. Successful liquid embolic embolization of the thoracic duct using true fill glue. 4. Exchange of left upper extremity PICC. Electronically Signed   By: Jacqulynn Cadet M.D.   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 She presents for lymphangiogram and thoracic duct embolization. EXAM: IR EMBO VENOUS NOT HEMORR HEMANG INC GUIDE ROADMAPPING; PICC REPLACEMENT UNDER ULTRASOUND; IR RIGHT FLUORO GUIDE CV LINE; IR ULTRASOUND GUIDANCE VASC ACCESS LEFT; LYMPHANGIOGRAM; IR ULTRASOUND GUIDANCE VASC ACCESS RIGHT 1. Ultrasound-guided placement of central venous catheter via right common femoral vein 2. Ultrasound-guided puncture  right superficial inguinal lymph node 3. Ultrasound-guided puncture left superficial inguinal lymph node 4. Bilateral lymphangiogram 5. Balloon occluded embolization of thoracic duct 6. Left upper extremity PICC exchange MEDICATIONS: 2 g cefoxitin. The antibiotic was administered within 1 hour of the procedure ANESTHESIA/SEDATION: General endotracheal anesthesia provided by the anesthesiology service. CONTRAST:  13m OMNIPAQUE IOHEXOL 300 MG/ML  SOLN FLUOROSCOPY: Radiation Exposure Index (as provided by the fluoroscopic device): 4025mGy Kerma COMPLICATIONS: None immediate. PROCEDURE: Informed consent was obtained from the patient following explanation of the procedure, risks, benefits and alternatives. The patient understands, agrees and consents for the procedure. All questions were addressed. A time out was performed prior to the initiation of the procedure. Maximal barrier sterile technique utilized including caps, mask, sterile gowns, sterile gloves, large sterile drape, hand hygiene, and Betadine prep. The right common femoral vein was interrogated with ultrasound and found to be widely patent. An image was obtained and stored for the medical record. Local anesthesia was attained by infiltration with 1% lidocaine. A small dermatotomy was made. Under real-time sonographic guidance, the vessel was punctured with a 21 gauge micropuncture needle. Using standard technique, the initial micro needle was exchanged over a 0.018 micro wire for a transitional 4 [place] sheath. The micro sheath was then exchanged over a 0.035 wire for a fascial dilator and the soft tissue tract was dilated. An aero triple-lumen central venous catheter was then advanced over the wire and position with the tip in the right common iliac vein. The catheter flushed and aspirated easily. The catheter was capped and secured in place. Ultrasound was used to interrogate the right groin. A large pathologic lymph node with what appears to be a  visible lymphatic channel was identified. Under real-time ultrasound guidance, a 25 gauge spinal needle was advanced into the vascular channel. Gentle did injection of contrast under fluoroscopy demonstrates a blob like architecture. This is not consistent with lymphatic flow. Therefore, a smaller and more normal appearing lymph nodes slightly more inferior was identified. Using the same technique, the hilum of the lymph node was punctured with a 25 gauge spinal needle. A gentle injection of Lipiodol under fluoroscopy demonstrates successful filling of lymphatic channels. The needle was secured in place with Tegaderm. Attention was now turned to the contralateral groin. Using the same technique, a lymph node on the left was successfully accessed with ultrasound guidance. Lipiodol  injection was performed under fluoroscopy. There was some extension of injected Lipiodol into the central venous system. Therefore, this lymph node was abandoned. A second lymph node was targeted and punctured. This time upon the hiatal injection there is normal opacification of the lymphatic system. Lipiodol was then slowly injected through both accessed superficial inguinal lymph nodes and a diagnostic lymphangiogram was performed. Numerous enlarged pathologic lymph nodes are identified bilaterally along the pelvic side chains and throughout the retroperitoneum. Ultimately, there was coalescence of the lymphatic system into the thoracic duct at the level of L2-L3. Once the thoracic duct was well visualized, angulation was performed to separate the atherosclerotic wall of the aorta from the thoracic duct. Attempts were then made to puncture the duct using a combination of different needles. After several attempts, catheterization was not successful. Therefore, the decision was made to attempt access of the thoracic duct at its insertion into the left subclavian vein. The patient's existing PICC line was cut and removed over a wire. A 5  French sheath was advanced into the left upper arm vein. Attempts were then made to catheterize the insertion of the thoracic duct using a combination of angiographic catheters and micro catheters. After a prolonged period, further attempts at this approach were abandoned. Attention was again turned to the thoracic duct which had re-opacified with Lipiodol. A 21 gauge trocar style needle was employed and used to successfully puncture the duct. A transcend wire was advanced up the duct. An initial attempt to advanced the Progreat catheter into the thoracic duct was not successful. Therefore, a 0.014 CXI catheter was selected and successfully advanced over the wire and into the thoracic duct. The catheter and wire combination were advanced as far into the duct is possible. Ultimately, a Glidewire Advantage wire was parked in the duct and the CXI catheter exchange for a 2.4 [place] Progreat catheter. The Progreat catheter was then navigated more distally into the duct to the level of the lower thoracic spine. Additional lymphadenopathy utilizing direct contrast injection demonstrates a region where the main cystic duct is obliterated by pathologic lymphadenopathy. The branches ram if I into dozens of small thread like branches which then reconstitutes the main thoracic duct. Efforts were made to find a passage through this Maze of small vessels, but this was not successful. At this point, the decision was made to proceed with embolization from the given location. As we had no backstop off coils to prevent embolization through the thoracic duct into the subclavian vein, we decided to place an occlusion balloon in the left subclavian vein. A Bentson wire was advanced through the left arm access into the right heart. A 12 x 40 mm mustang balloon was advanced over the wire and positioned across the origin of the thoracic duct. The balloon was inflated to full effacement. True fill liquid embolic glue was then reconstituted  in Lipiodol at a concentration of 9:1. The glue mixture was then carefully injected through the Progreat catheter in till there was successful antegrade filling of nearly the entirety of the thoracic duct. The glue was taken up to the level of the aortic arch almost to the inferior aspect of the clavicle. No further injection was performed to prevent reflux into the subclavian vein. The microcatheter was then pulled back and the remaining thoracic duct embolized with glue. The catheter was then withdrawn. After several minutes the subclavian venous balloon was deflated and removed. A new dual lumen power injectable PICC was cut to 43 cm and  advanced over the wire and position with the tip at the cavoatrial junction. The catheter flushes and aspirates easily. The catheter was capped and secured to the skin with an adhesive fixation device. IMPRESSION: 1. Successful placement of a right common femoral central venous catheter using ultrasound and fluoroscopic guidance. 2. Successful ultrasound-guided puncture of bilateral superficial inguinal lymph nodes with bilateral abdominopelvic lymphangiogram. 3. Successful liquid embolic embolization of the thoracic duct using true fill glue. 4. Exchange of left upper extremity PICC. Electronically Signed   By: Jacqulynn Cadet M.D.   On: 04/05/2022 15:47   IR LYMPHANGIOGRAM PEL/ABD BILAT  Result Date: 04/13/2022 INDICATION: 61 year old female with chronic lymphocytic leukemia and large volume bilateral chylous pleural effusions. She presents for lymphangiogram and thoracic duct embolization. EXAM: IR EMBO VENOUS NOT HEMORR HEMANG INC GUIDE ROADMAPPING; PICC REPLACEMENT UNDER ULTRASOUND; IR RIGHT FLUORO GUIDE CV LINE; IR ULTRASOUND GUIDANCE VASC ACCESS LEFT; LYMPHANGIOGRAM; IR ULTRASOUND GUIDANCE VASC ACCESS RIGHT 1. Ultrasound-guided placement of central venous catheter via right common femoral vein 2. Ultrasound-guided puncture right superficial inguinal lymph node 3.  Ultrasound-guided puncture left superficial inguinal lymph node 4. Bilateral lymphangiogram 5. Balloon occluded embolization of thoracic duct 6. Left upper extremity PICC exchange MEDICATIONS: 2 g cefoxitin. The antibiotic was administered within 1 hour of the procedure ANESTHESIA/SEDATION: General endotracheal anesthesia provided by the anesthesiology service. CONTRAST:  9m OMNIPAQUE IOHEXOL 300 MG/ML  SOLN FLUOROSCOPY: Radiation Exposure Index (as provided by the fluoroscopic device): 4161mGy Kerma COMPLICATIONS: None immediate. PROCEDURE: Informed consent was obtained from the patient following explanation of the procedure, risks, benefits and alternatives. The patient understands, agrees and consents for the procedure. All questions were addressed. A time out was performed prior to the initiation of the procedure. Maximal barrier sterile technique utilized including caps, mask, sterile gowns, sterile gloves, large sterile drape, hand hygiene, and Betadine prep. The right common femoral vein was interrogated with ultrasound and found to be widely patent. An image was obtained and stored for the medical record. Local anesthesia was attained by infiltration with 1% lidocaine. A small dermatotomy was made. Under real-time sonographic guidance, the vessel was punctured with a 21 gauge micropuncture needle. Using standard technique, the initial micro needle was exchanged over a 0.018 micro wire for a transitional 4 [place] sheath. The micro sheath was then exchanged over a 0.035 wire for a fascial dilator and the soft tissue tract was dilated. An aero triple-lumen central venous catheter was then advanced over the wire and position with the tip in the right common iliac vein. The catheter flushed and aspirated easily. The catheter was capped and secured in place. Ultrasound was used to interrogate the right groin. A large pathologic lymph node with what appears to be a visible lymphatic channel was identified.  Under real-time ultrasound guidance, a 25 gauge spinal needle was advanced into the vascular channel. Gentle did injection of contrast under fluoroscopy demonstrates a blob like architecture. This is not consistent with lymphatic flow. Therefore, a smaller and more normal appearing lymph nodes slightly more inferior was identified. Using the same technique, the hilum of the lymph node was punctured with a 25 gauge spinal needle. A gentle injection of Lipiodol under fluoroscopy demonstrates successful filling of lymphatic channels. The needle was secured in place with Tegaderm. Attention was now turned to the contralateral groin. Using the same technique, a lymph node on the left was successfully accessed with ultrasound guidance. Lipiodol injection was performed under fluoroscopy. There was some extension of injected Lipiodol into  the central venous system. Therefore, this lymph node was abandoned. A second lymph node was targeted and punctured. This time upon the hiatal injection there is normal opacification of the lymphatic system. Lipiodol was then slowly injected through both accessed superficial inguinal lymph nodes and a diagnostic lymphangiogram was performed. Numerous enlarged pathologic lymph nodes are identified bilaterally along the pelvic side chains and throughout the retroperitoneum. Ultimately, there was coalescence of the lymphatic system into the thoracic duct at the level of L2-L3. Once the thoracic duct was well visualized, angulation was performed to separate the atherosclerotic wall of the aorta from the thoracic duct. Attempts were then made to puncture the duct using a combination of different needles. After several attempts, catheterization was not successful. Therefore, the decision was made to attempt access of the thoracic duct at its insertion into the left subclavian vein. The patient's existing PICC line was cut and removed over a wire. A 5 French sheath was advanced into the left  upper arm vein. Attempts were then made to catheterize the insertion of the thoracic duct using a combination of angiographic catheters and micro catheters. After a prolonged period, further attempts at this approach were abandoned. Attention was again turned to the thoracic duct which had re-opacified with Lipiodol. A 21 gauge trocar style needle was employed and used to successfully puncture the duct. A transcend wire was advanced up the duct. An initial attempt to advanced the Progreat catheter into the thoracic duct was not successful. Therefore, a 0.014 CXI catheter was selected and successfully advanced over the wire and into the thoracic duct. The catheter and wire combination were advanced as far into the duct is possible. Ultimately, a Glidewire Advantage wire was parked in the duct and the CXI catheter exchange for a 2.4 [place] Progreat catheter. The Progreat catheter was then navigated more distally into the duct to the level of the lower thoracic spine. Additional lymphadenopathy utilizing direct contrast injection demonstrates a region where the main cystic duct is obliterated by pathologic lymphadenopathy. The branches ram if I into dozens of small thread like branches which then reconstitutes the main thoracic duct. Efforts were made to find a passage through this Maze of small vessels, but this was not successful. At this point, the decision was made to proceed with embolization from the given location. As we had no backstop off coils to prevent embolization through the thoracic duct into the subclavian vein, we decided to place an occlusion balloon in the left subclavian vein. A Bentson wire was advanced through the left arm access into the right heart. A 12 x 40 mm mustang balloon was advanced over the wire and positioned across the origin of the thoracic duct. The balloon was inflated to full effacement. True fill liquid embolic glue was then reconstituted in Lipiodol at a concentration of 9:1.  The glue mixture was then carefully injected through the Progreat catheter in till there was successful antegrade filling of nearly the entirety of the thoracic duct. The glue was taken up to the level of the aortic arch almost to the inferior aspect of the clavicle. No further injection was performed to prevent reflux into the subclavian vein. The microcatheter was then pulled back and the remaining thoracic duct embolized with glue. The catheter was then withdrawn. After several minutes the subclavian venous balloon was deflated and removed. A new dual lumen power injectable PICC was cut to 43 cm and advanced over the wire and position with the tip at the cavoatrial junction.  The catheter flushes and aspirates easily. The catheter was capped and secured to the skin with an adhesive fixation device. IMPRESSION: 1. Successful placement of a right common femoral central venous catheter using ultrasound and fluoroscopic guidance. 2. Successful ultrasound-guided puncture of bilateral superficial inguinal lymph nodes with bilateral abdominopelvic lymphangiogram. 3. Successful liquid embolic embolization of the thoracic duct using true fill glue. 4. Exchange of left upper extremity PICC. Electronically Signed   By: Jacqulynn Cadet M.D.   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 She presents for lymphangiogram and thoracic duct embolization. EXAM: IR EMBO VENOUS NOT HEMORR HEMANG INC GUIDE ROADMAPPING; PICC REPLACEMENT UNDER ULTRASOUND; IR RIGHT FLUORO GUIDE CV LINE; IR ULTRASOUND GUIDANCE VASC ACCESS LEFT; LYMPHANGIOGRAM; IR ULTRASOUND GUIDANCE VASC ACCESS RIGHT 1. Ultrasound-guided placement of central venous catheter via right common femoral vein 2. Ultrasound-guided puncture right superficial inguinal lymph node 3. Ultrasound-guided puncture left  superficial inguinal lymph node 4. Bilateral lymphangiogram 5. Balloon occluded embolization of thoracic duct 6. Left upper extremity PICC exchange MEDICATIONS: 2 g cefoxitin. The antibiotic was administered within 1 hour of the procedure ANESTHESIA/SEDATION: General endotracheal anesthesia provided by the anesthesiology service. CONTRAST:  70m OMNIPAQUE IOHEXOL 300 MG/ML  SOLN FLUOROSCOPY: Radiation Exposure Index (as provided by the fluoroscopic device): 4295mGy Kerma COMPLICATIONS: None immediate. PROCEDURE: Informed consent was obtained from the patient following explanation of the procedure, risks, benefits and alternatives. The patient understands, agrees and consents for the procedure. All questions were addressed. A time out was performed prior to the initiation of the procedure. Maximal barrier sterile technique utilized including caps, mask, sterile gowns, sterile gloves, large sterile drape, hand hygiene, and Betadine prep. The right common femoral vein was interrogated with ultrasound and found to be widely patent. An image was obtained and stored for the medical record. Local anesthesia was attained by infiltration with 1% lidocaine. A small dermatotomy was made. Under real-time sonographic guidance, the vessel was punctured with a 21 gauge micropuncture needle. Using standard technique, the initial micro needle was exchanged over a 0.018 micro wire for a transitional 4 [place] sheath. The micro sheath was then exchanged over a 0.035 wire for a fascial dilator and the soft tissue tract was dilated. An aero triple-lumen central venous catheter was then advanced over the wire and position with the tip in the right common iliac vein. The catheter flushed and aspirated easily. The catheter was capped and secured in place. Ultrasound was used to interrogate the right groin. A large pathologic lymph node with what appears to be a visible lymphatic channel was identified. Under real-time ultrasound  guidance, a 25 gauge spinal needle was advanced into the vascular channel. Gentle did injection of contrast under fluoroscopy demonstrates a blob like architecture. This is not consistent with lymphatic flow. Therefore, a smaller and more normal appearing lymph nodes slightly more inferior was identified. Using the same technique, the hilum of the lymph node was punctured with a 25 gauge spinal needle. A gentle injection of Lipiodol under fluoroscopy demonstrates successful filling of lymphatic channels. The needle was secured in place with Tegaderm. Attention was now turned to the contralateral groin. Using the same technique, a lymph node on the left was successfully accessed with ultrasound guidance. Lipiodol injection was performed under fluoroscopy. There was some extension of injected Lipiodol into the central venous system. Therefore, this lymph node was abandoned.  A second lymph node was targeted and punctured. This time upon the hiatal injection there is normal opacification of the lymphatic system. Lipiodol was then slowly injected through both accessed superficial inguinal lymph nodes and a diagnostic lymphangiogram was performed. Numerous enlarged pathologic lymph nodes are identified bilaterally along the pelvic side chains and throughout the retroperitoneum. Ultimately, there was coalescence of the lymphatic system into the thoracic duct at the level of L2-L3. Once the thoracic duct was well visualized, angulation was performed to separate the atherosclerotic wall of the aorta from the thoracic duct. Attempts were then made to puncture the duct using a combination of different needles. After several attempts, catheterization was not successful. Therefore, the decision was made to attempt access of the thoracic duct at its insertion into the left subclavian vein. The patient's existing PICC line was cut and removed over a wire. A 5 French sheath was advanced into the left upper arm vein. Attempts were  then made to catheterize the insertion of the thoracic duct using a combination of angiographic catheters and micro catheters. After a prolonged period, further attempts at this approach were abandoned. Attention was again turned to the thoracic duct which had re-opacified with Lipiodol. A 21 gauge trocar style needle was employed and used to successfully puncture the duct. A transcend wire was advanced up the duct. An initial attempt to advanced the Progreat catheter into the thoracic duct was not successful. Therefore, a 0.014 CXI catheter was selected and successfully advanced over the wire and into the thoracic duct. The catheter and wire combination were advanced as far into the duct is possible. Ultimately, a Glidewire Advantage wire was parked in the duct and the CXI catheter exchange for a 2.4 [place] Progreat catheter. The Progreat catheter was then navigated more distally into the duct to the level of the lower thoracic spine. Additional lymphadenopathy utilizing direct contrast injection demonstrates a region where the main cystic duct is obliterated by pathologic lymphadenopathy. The branches ram if I into dozens of small thread like branches which then reconstitutes the main thoracic duct. Efforts were made to find a passage through this Maze of small vessels, but this was not successful. At this point, the decision was made to proceed with embolization from the given location. As we had no backstop off coils to prevent embolization through the thoracic duct into the subclavian vein, we decided to place an occlusion balloon in the left subclavian vein. A Bentson wire was advanced through the left arm access into the right heart. A 12 x 40 mm mustang balloon was advanced over the wire and positioned across the origin of the thoracic duct. The balloon was inflated to full effacement. True fill liquid embolic glue was then reconstituted in Lipiodol at a concentration of 9:1. The glue mixture was then  carefully injected through the Progreat catheter in till there was successful antegrade filling of nearly the entirety of the thoracic duct. The glue was taken up to the level of the aortic arch almost to the inferior aspect of the clavicle. No further injection was performed to prevent reflux into the subclavian vein. The microcatheter was then pulled back and the remaining thoracic duct embolized with glue. The catheter was then withdrawn. After several minutes the subclavian venous balloon was deflated and removed. A new dual lumen power injectable PICC was cut to 43 cm and advanced over the wire and position with the tip at the cavoatrial junction. The catheter flushes and aspirates easily. The catheter was capped  and secured to the skin with an adhesive fixation device. IMPRESSION: 1. Successful placement of a right common femoral central venous catheter using ultrasound and fluoroscopic guidance. 2. Successful ultrasound-guided puncture of bilateral superficial inguinal lymph nodes with bilateral abdominopelvic lymphangiogram. 3. Successful liquid embolic embolization of the thoracic duct using true fill glue. 4. Exchange of left upper extremity PICC. Electronically Signed   By: Jacqulynn Cadet M.D.   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 She presents for lymphangiogram and thoracic duct embolization. EXAM: IR EMBO VENOUS NOT HEMORR HEMANG INC GUIDE ROADMAPPING; PICC REPLACEMENT UNDER ULTRASOUND; IR RIGHT FLUORO GUIDE CV LINE; IR ULTRASOUND GUIDANCE VASC ACCESS LEFT; LYMPHANGIOGRAM; IR ULTRASOUND GUIDANCE VASC ACCESS RIGHT 1. Ultrasound-guided placement of central venous catheter via right common femoral vein 2. Ultrasound-guided puncture right superficial inguinal lymph node 3. Ultrasound-guided puncture left superficial inguinal lymph node 4.  Bilateral lymphangiogram 5. Balloon occluded embolization of thoracic duct 6. Left upper extremity PICC exchange MEDICATIONS: 2 g cefoxitin. The antibiotic was administered within 1 hour of the procedure ANESTHESIA/SEDATION: General endotracheal anesthesia provided by the anesthesiology service. CONTRAST:  71m OMNIPAQUE IOHEXOL 300 MG/ML  SOLN FLUOROSCOPY: Radiation Exposure Index (as provided by the fluoroscopic device): 4017mGy Kerma COMPLICATIONS: None immediate. PROCEDURE: Informed consent was obtained from the patient following explanation of the procedure, risks, benefits and alternatives. The patient understands, agrees and consents for the procedure. All questions were addressed. A time out was performed prior to the initiation of the procedure. Maximal barrier sterile technique utilized including caps, mask, sterile gowns, sterile gloves, large sterile drape, hand hygiene, and Betadine prep. The right common femoral vein was interrogated with ultrasound and found to be widely patent. An image was obtained and stored for the medical record. Local anesthesia was attained by infiltration with 1% lidocaine. A small dermatotomy was made. Under real-time sonographic guidance, the vessel was punctured with a 21 gauge micropuncture needle. Using standard technique, the initial micro needle was exchanged over a 0.018 micro wire for a transitional 4 [place] sheath. The micro sheath was then exchanged over a 0.035 wire for a fascial dilator and the soft tissue tract was dilated. An aero triple-lumen central venous catheter was then advanced over the wire and position with the tip in the right common iliac vein. The catheter flushed and aspirated easily. The catheter was capped and secured in place. Ultrasound was used to interrogate the right groin. A large pathologic lymph node with what appears to be a visible lymphatic channel was identified. Under real-time ultrasound guidance, a 25 gauge spinal needle was  advanced into the vascular channel. Gentle did injection of contrast under fluoroscopy demonstrates a blob like architecture. This is not consistent with lymphatic flow. Therefore, a smaller and more normal appearing lymph nodes slightly more inferior was identified. Using the same technique, the hilum of the lymph node was punctured with a 25 gauge spinal needle. A gentle injection of Lipiodol under fluoroscopy demonstrates successful filling of lymphatic channels. The needle was secured in place with Tegaderm. Attention was now turned to the contralateral groin. Using the same technique, a lymph node on the left was successfully accessed with ultrasound guidance. Lipiodol injection was performed under fluoroscopy. There was some extension of injected Lipiodol into the central venous system. Therefore, this lymph node was abandoned. A second lymph node was targeted and punctured. This time upon  the hiatal injection there is normal opacification of the lymphatic system. Lipiodol was then slowly injected through both accessed superficial inguinal lymph nodes and a diagnostic lymphangiogram was performed. Numerous enlarged pathologic lymph nodes are identified bilaterally along the pelvic side chains and throughout the retroperitoneum. Ultimately, there was coalescence of the lymphatic system into the thoracic duct at the level of L2-L3. Once the thoracic duct was well visualized, angulation was performed to separate the atherosclerotic wall of the aorta from the thoracic duct. Attempts were then made to puncture the duct using a combination of different needles. After several attempts, catheterization was not successful. Therefore, the decision was made to attempt access of the thoracic duct at its insertion into the left subclavian vein. The patient's existing PICC line was cut and removed over a wire. A 5 French sheath was advanced into the left upper arm vein. Attempts were then made to catheterize the insertion  of the thoracic duct using a combination of angiographic catheters and micro catheters. After a prolonged period, further attempts at this approach were abandoned. Attention was again turned to the thoracic duct which had re-opacified with Lipiodol. A 21 gauge trocar style needle was employed and used to successfully puncture the duct. A transcend wire was advanced up the duct. An initial attempt to advanced the Progreat catheter into the thoracic duct was not successful. Therefore, a 0.014 CXI catheter was selected and successfully advanced over the wire and into the thoracic duct. The catheter and wire combination were advanced as far into the duct is possible. Ultimately, a Glidewire Advantage wire was parked in the duct and the CXI catheter exchange for a 2.4 [place] Progreat catheter. The Progreat catheter was then navigated more distally into the duct to the level of the lower thoracic spine. Additional lymphadenopathy utilizing direct contrast injection demonstrates a region where the main cystic duct is obliterated by pathologic lymphadenopathy. The branches ram if I into dozens of small thread like branches which then reconstitutes the main thoracic duct. Efforts were made to find a passage through this Maze of small vessels, but this was not successful. At this point, the decision was made to proceed with embolization from the given location. As we had no backstop off coils to prevent embolization through the thoracic duct into the subclavian vein, we decided to place an occlusion balloon in the left subclavian vein. A Bentson wire was advanced through the left arm access into the right heart. A 12 x 40 mm mustang balloon was advanced over the wire and positioned across the origin of the thoracic duct. The balloon was inflated to full effacement. True fill liquid embolic glue was then reconstituted in Lipiodol at a concentration of 9:1. The glue mixture was then carefully injected through the Progreat  catheter in till there was successful antegrade filling of nearly the entirety of the thoracic duct. The glue was taken up to the level of the aortic arch almost to the inferior aspect of the clavicle. No further injection was performed to prevent reflux into the subclavian vein. The microcatheter was then pulled back and the remaining thoracic duct embolized with glue. The catheter was then withdrawn. After several minutes the subclavian venous balloon was deflated and removed. A new dual lumen power injectable PICC was cut to 43 cm and advanced over the wire and position with the tip at the cavoatrial junction. The catheter flushes and aspirates easily. The catheter was capped and secured to the skin with an adhesive fixation device. IMPRESSION:  1. Successful placement of a right common femoral central venous catheter using ultrasound and fluoroscopic guidance. 2. Successful ultrasound-guided puncture of bilateral superficial inguinal lymph nodes with bilateral abdominopelvic lymphangiogram. 3. Successful liquid embolic embolization of the thoracic duct using true fill glue. 4. Exchange of left upper extremity PICC. Electronically Signed   By: Jacqulynn Cadet M.D.   On: 04/04/2022 15:47   DG CHEST PORT 1 VIEW  Result Date: 03/16/2022 CLINICAL DATA:  Bilateral chest tubes. EXAM: PORTABLE CHEST 1 VIEW COMPARISON:  Radiographs 03/15/2022.  CT 03/13/2022. FINDINGS: 0448 hours. Small caliber pigtail chest tubes remain in place bilaterally. Small residual pleural effusions bilaterally are unchanged with probable extension into the fissures on the left. Persistent bibasilar atelectasis and possible mild edema. No evidence of pneumothorax. The heart remains enlarged. There is aortic atherosclerosis. Left arm PICC extends to the level of the mid right atrium. IMPRESSION: Unchanged residual small bilateral pleural effusions. Possible mildly increased pulmonary edema. No pneumothorax. Electronically Signed   By: Richardean Sale M.D.   On: 03/16/2022 08:32   DG CHEST PORT 1 VIEW  Result Date: 03/15/2022 CLINICAL DATA:  Large volume output from chest tube. EXAM: PORTABLE CHEST 1 VIEW COMPARISON:  One-view chest x-ray 03/15/2022 at 5:37 a.m. FINDINGS: Heart is enlarged. Atherosclerotic calcifications are present at the aortic arch. The right pleural effusion is significantly decreased compared to the prior exam. No pneumothorax is present. Bilateral chest tubes are in place. The left pleural effusion is stable. Bibasilar atelectasis is noted. IMPRESSION: 1. Interval decrease in right pleural effusion. 2. Bilateral chest tubes without pneumothorax. 3. Stable left pleural effusion and basilar atelectasis. Electronically Signed   By: San Morelle M.D.   On: 03/15/2022 16:36   DG CHEST PORT 1 VIEW  Result Date: 03/15/2022 CLINICAL DATA:  Bilateral chest tubes. EXAM: PORTABLE CHEST 1 VIEW COMPARISON:  Radiographs 03/14/2022 and 03/12/2022.  CT 03/13/2022. FINDINGS: 0537 hours. Bilateral small caliber pigtail chest tubes are unchanged in position. The left pleural effusion has significantly decreased in volume. The right chest tube is not significantly changed with a component extending into the fissure. No evidence of pneumothorax. The heart size and mediastinal contours are stable. Left arm PICC projects to the mid right atrium. IMPRESSION: 1. Interval significant improvement in size of the left pleural effusion. 2. The right pleural effusion has not significantly changed. 3. Stable position of the bilateral chest tubes. Electronically Signed   By: Richardean Sale M.D.   On: 03/15/2022 08:56   DG CHEST PORT 1 VIEW  Result Date: 03/14/2022 CLINICAL DATA:  Pleural effusion EXAM: PORTABLE CHEST 1 VIEW COMPARISON:  Chest x-ray dated June twenty-eighth 2023 FINDINGS: Unchanged position of left arm PICC. Visualized cardiac and mediastinal contours are unchanged. Moderate loculated right pleural effusion, possibly slightly  decreased when compared with prior radiograph. Unchanged moderate left pleural effusion. Bilateral chest tubes in place. Bibasilar opacities which are likely due to atelectasis. No evidence of pneumothorax. IMPRESSION: Moderate loculated right pleural effusion, possibly slightly decreased in size when compared with prior chest radiograph. Unchanged moderate left pleural effusion. Bilateral chest tubes in place. Electronically Signed   By: Yetta Glassman M.D.   On: 03/14/2022 10:40   CT CHEST ABDOMEN PELVIS WO CONTRAST  Result Date: 03/13/2022 CLINICAL DATA:  A 61 year old female presents for evaluation of mediastinal mass and abdominal pain. * Tracking Code: BO * EXAM: CT CHEST, ABDOMEN AND PELVIS WITHOUT CONTRAST TECHNIQUE: Multidetector CT imaging of the chest, abdomen and pelvis was performed following  the standard protocol without IV contrast. RADIATION DOSE REDUCTION: This exam was performed according to the departmental dose-optimization program which includes automated exposure control, adjustment of the mA and/or kV according to patient size and/or use of iterative reconstruction technique. COMPARISON:  February 22, 2022. FINDINGS: CT CHEST FINDINGS Cardiovascular: LEFT-sided PICC terminates at the caval to atrial junction. Calcified atheromatous plaque in the thoracic aorta without aneurysmal dilation. Heart size normal. Suggestion of pericardial nodularity anteriorly. No discrete measurable disease. Mediastinum/Nodes: Bulky adenopathy at the level of the thoracic inlet is unchanged compared to the June 10th exam grossly. Bulky nodularity in the RIGHT axilla with extensive surrounding edema largest lymph node 3.4 cm short axis (image 28/2) unchanged compared to recent imaging. Bulky lymph nodes fill the axilla and track into the subpectoral region with numerous lymph nodes also in the neck. RIGHT axillary adenopathy with surgical clips in the RIGHT axilla. Subpectoral adenopathy and axillary adenopathy  not as pronounced as on the LEFT. Largest lymph node 21 mm short axis, previously approximately 24 mm. Grossly overall there is some improvement with respect to the bulk of lymph nodes particularly at the thoracic inlet when compared to previous imaging from June 1st. On June 1st the LEFT axillary lymph node measured above measured 4.2 cm. No mediastinal adenopathy.  No hilar adenopathy. Lungs/Pleura: Bilateral chest tubes in place without pneumothorax. Still with large LEFT and RIGHT pleural effusions with loculated appearance. RIGHT-sided tube and LEFT-sided tube in the sub pulmonic aspect of the pleural fluid and RIGHT-sided pleural fluid appears more loculated than LEFT-sided fluid. Volume of pleural fluid in the LEFT and RIGHT chest is diminished on the RIGHT and only slightly diminished on the LEFT still with considerable volume of pleural fluid in the chest. Musculoskeletal: See below for full musculoskeletal details. CT ABDOMEN PELVIS FINDINGS Hepatobiliary: Smooth hepatic contours post cholecystectomy. 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 Spleen: Spleen is normal size. Adrenals/Urinary Tract: Adrenal glands are largely obscured without gross abnormality. Kidneys with smooth contours and no hydronephrosis. Smooth contour the urinary bladder. Stomach/Bowel: No signs of bowel dilation. Abundant stool in the transverse colon. No focal inflammation associated with bowel with limited assessment. Vascular/Lymphatic: Bulky adenopathy in the retroperitoneum has diminished, along the LEFT periaortic chain 2.9 cm short axis nodal tissue previously up to 4.7 cm. Decreased bulk of lymph nodes in the celiac region in general. Low LEFT periaortic lymph nodes (image 79/2) 2.6 cm as compared to 3.3 cm. Bulky pelvic lymph nodes largest on the LEFT is LEFT external iliac lymph node 2.7 cm as compared to 3.7 cm short axis. RIGHT external iliac lymph node (image  104/2) 2.9 cm as compared to 3.4 cm short axis. Groin lymph nodes are similarly decreased in size. Aortic atherosclerosis. Vascular structures are encased by nodal enlargement/soft tissue. Reproductive: Unremarkable grossly but difficult to assess. Other: Extensive body wall edema.  No pneumoperitoneum. Musculoskeletal: No acute bone finding. No destructive bone process. Spinal degenerative changes. IMPRESSION: 1. Bilateral chest tubes in place without pneumothorax. 2. Still with large volume pleural fluid collections, more loculated appearing on the RIGHT despite chest tube placement. 3. Improving appearance of adenopathy over a series of prior exams most notably since examinations from early June still with extensive bulky adenopathy remaining highly concerning for lymphoproliferative disorder. 4. Anasarca. 5. Aortic atherosclerosis. Aortic Atherosclerosis (ICD10-I70.0). Electronically Signed   By: Zetta Bills M.D.   On: 03/13/2022 16:46   DG CHEST PORT 1 VIEW  Result  Date: 03/12/2022 CLINICAL DATA:  Pleural effusion EXAM: PORTABLE CHEST 1 VIEW COMPARISON:  Multiple prior chest radiographs including most recent dated March 11, 2022 FINDINGS: The heart size and mediastinal contours are within normal limits. No significant interval change in bilateral pleural effusions with chest tubes in place. Atelectasis of the right middle lobe, unchanged. No appreciable pneumothorax. Left access PICC with distal tip in the right atrium, unchanged. Bilateral glenohumeral osteoarthritis. No acute osseous abnormality. IMPRESSION: 1. Bilateral chest tubes in place with unchanged appearance of bilateral pleural effusions and right midlung atelectasis. No appreciable pneumothorax. 2.  Stable cardiomediastinal silhouette. Electronically Signed   By: Keane Police D.O.   On: 03/12/2022 12:04   DG CHEST PORT 1 VIEW  Result Date: 03/11/2022 CLINICAL DATA:  Pleural effusion.  History of hypertension. EXAM: PORTABLE CHEST 1 VIEW  COMPARISON:  03/09/2022 and CT chest on 02/22/2022 FINDINGS: Patient has bilateral pleural catheters overlying the lung bases. LEFT-sided PICC line tip overlies the RIGHT atrium. Heart size is normal. There is increased opacification at the LEFT lung base, consistent with pleural effusion and atelectasis or infiltrates. Possible small residual LEFT apical pneumothorax. No definite RIGHT pneumothorax identified. There is new opacity at the RIGHT lung base, likely representing fluid within the minor fissure. IMPRESSION: 1. Increased opacification of the LEFT lung base. 2. Increased fluid in the minor fissure. 3. Possible persistent LEFT apical pneumothorax. Electronically Signed   By: Nolon Nations M.D.   On: 03/11/2022 11:19   DG Chest Port 1 View  Result Date: 03/09/2022 CLINICAL DATA:  Evaluate chest tube placement EXAM: PORTABLE CHEST 1 VIEW COMPARISON:  03/07/2022. FINDINGS: Bilateral chest tubes remain in place. Decrease in volume of scratch set the right scratch set small right apical pneumothorax measures 7 mm over the right apex compared with 9 mm previously. The tiny left apical pneumothorax measures 2 mm over the left apex versus 3 mm previously. There is a left arm PICC line with tip in the cavoatrial junction. Bilateral pleural effusions appear mildly increased in volume from previous exam with progressive perifissural thickening within the right midlung. IMPRESSION: 1. Continued decrease and volume of tiny bilateral pneumothoraces. 2. Increase in bilateral pleural effusions. Electronically Signed   By: Kerby Moors M.D.   On: 03/09/2022 12:02   DG CHEST PORT 1 VIEW  Result Date: 03/07/2022 CLINICAL DATA:  Respiratory failure EXAM: PORTABLE CHEST 1 VIEW COMPARISON:  03/06/2022 and prior studies FINDINGS: The cardiomediastinal silhouette is unremarkable. Bilateral thoracostomy tubes and LEFT PICC line with tip overlying the SUPERIOR cavoatrial junction again noted. Very small RIGHT apical  pneumothorax and miniscule LEFT apical pneumothorax have both slightly decreased. Mild bibasilar atelectasis again noted. Surgical clips overlying the RIGHT axilla again noted. IMPRESSION: Slightly decreased very small bilateral pneumothoraces, otherwise unchanged appearance of the chest. Electronically Signed   By: Margarette Canada M.D.   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 The cardiomediastinal silhouette is unchanged. There are bibasilar chest tubes in place with decreased pleural effusions and mid to lower lung airspace opacities in comparison to prior exam. There are small biapical pneumothoraces post slightly decreased from prior exam. Unchanged mild interstitial prominence. Bones are unchanged. IMPRESSION: Decreased pleural effusions and adjacent atelectasis with stable position of bibasilar chest  tubes. Small biapical pneumothoraces, both slightly decreased in size in comparison to prior. Electronically Signed   By: Maurine Simmering M.D.   On: 03/06/2022 07:37   DG CHEST PORT 1 VIEW  Result Date: 03/05/2022 CLINICAL DATA:  Pleural effusion EXAM: PORTABLE CHEST 1 VIEW COMPARISON:  Chest x-ray dated March 04, 2022 FINDINGS: Cardiac and mediastinal contours are unchanged. Unchanged position of left arm PICC. Small right pleural effusion. Stable small right pleural effusion with new right apical pneumothorax. Bilateral chest tubes in place. Lower lung predominant heterogeneous opacities, likely due to atelectasis. IMPRESSION: 1. Stable small right pleural effusion with new right apical pneumothorax. 2. Small left pleural effusion is decreased in size when compared with prior exam, possible new left apical pneumothorax versus skin fold  artifact. Recommend attention on follow-up. Electronically Signed   By: Yetta Glassman M.D.   On: 03/05/2022 08:25   DG CHEST PORT 1 VIEW  Result Date: 03/04/2022 CLINICAL DATA:  Provided history: Pleural effusion. EXAM: PORTABLE CHEST 1 VIEW COMPARISON:  Prior chest radiographs 03/03/2022 and earlier. FINDINGS: Unchanged position of a left-sided PICC with tip projecting at the level of the lower SVC. Redemonstrated bibasilar chest tubes. The cardiac silhouette is unchanged. Aortic atherosclerosis. Interval decrease in size of a right pleural effusion, now trace to small. Associated mild right basilar atelectasis, improved. Persistent moderate left pleural effusion with associated atelectasis and/or consolidation within the left mid to lower lung field. No evidence of pneumothorax. IMPRESSION: Left-sided PICC and bilateral chest tubes, unchanged in position. Interval decrease in size of a right pleural effusion, now trace to small. Associated mild right basilar atelectasis, improved. Persistent moderate left pleural effusion with associated atelectasis and/or consolidation within the left mid to lower lung field. Electronically Signed   By: Kellie Simmering D.O.   On: 03/04/2022 08:58   DG CHEST PORT 1 VIEW  Result Date: 03/03/2022 CLINICAL DATA:  Chest tube placement. EXAM: PORTABLE CHEST 1 VIEW COMPARISON:  March 03, 2022 (5:33 a.m.) FINDINGS: There is stable left-sided PICC line positioning. Since the prior study, there is been interval bilateral chest tube placement. The distal ends of both chest tubes are seen overlying the medial aspects of the bilateral lung bases, respectively. Persistent bibasilar airspace disease is seen, left greater than right. Bilateral moderate-sized pleural effusions are noted. This is increased in severity on the right. No pneumothorax is identified. The cardiac silhouette is unchanged in size and appearance. Moderate severity calcification of the aortic arch is seen. Radiopaque  surgical clips are noted within the right upper quadrant. The visualized skeletal structures are unremarkable. IMPRESSION: 1. Interval bilateral chest tube placement, as described above, with persistent bibasilar airspace disease, left greater than right. 2. Bilateral moderate-sized pleural effusions, increased in severity on the right. Electronically Signed   By: Virgina Norfolk M.D.   On: 03/03/2022 18:08   DG CHEST PORT 1 VIEW  Result Date: 03/03/2022 CLINICAL DATA:  Follow-up pleural effusion. EXAM: PORTABLE CHEST 1 VIEW COMPARISON:  Chest radiograph March 02, 2022 at 1145 hours FINDINGS: The heart size and mediastinal contours are largely obscured but appear unchanged. Aortic atherosclerosis. Similar size moderate left pleural effusion and left lung base atelectasis/infiltrate. Stable small right pleural effusion with interval progression of the right lung base opacities including a peripheral or nodular focus of opacification in the right lateral lung base. The visualized skeletal structures are unchanged. IMPRESSION: Similar moderate left pleural effusion with adjacent atelectasis/infiltrate. Stable small right pleural effusion with interval progression of the opacities in  the right lung base. Electronically Signed   By: Dahlia Bailiff M.D.   On: 03/03/2022 08:14   DG CHEST PORT 1 VIEW  Result Date: 03/02/2022 CLINICAL DATA:  Follow-up pleural effusion. EXAM: PORTABLE CHEST 1 VIEW COMPARISON:  Chest radiograph dated 03/02/2022. FINDINGS: Left-sided PICC in similar position. Similar appearance of moderate size left pleural effusion and left lung base atelectasis or infiltrate. Interval progression of right lung base opacities. No pneumothorax. Atherosclerotic calcification of the aorta. No acute osseous pathology. IMPRESSION: 1. Similar appearance of moderate size left pleural effusion and left lung base atelectasis or infiltrate. 2. Interval progression of right lung base opacities. Electronically  Signed   By: Anner Crete M.D.   On: 03/02/2022 23:58   DG CHEST PORT 1 VIEW  Result Date: 03/02/2022 CLINICAL DATA:  Right chest tube fell out today. Follow-up large left pleural effusion. Evaluate for pneumothorax. EXAM: PORTABLE CHEST 1 VIEW COMPARISON:  Portable chest yesterday at 4:52 p.m. FINDINGS: 6:54 a.m., 03/02/2022. Left PICC remains in place with tip at the superior cavoatrial junction. Right chest tube is no longer seen. There is no visible pneumothorax. A large left pleural effusion again obscures the lower 2/3 of the left chest and is unchanged. Small to moderate right pleural effusion and overlying hazy interstitial consolidation are also similar. The remaining right lung is clear. Heart size is stable , central vessels are slightly prominent without edema. Aortic atherosclerosis. Osteopenia and degenerative change both shoulders. IMPRESSION: Overall aeration is unchanged. A large left pleural effusion small to moderate right pleural effusion and overlying consolidation appear similar. No visible pneumothorax with right chest tube no longer seen. Electronically Signed   By: Telford Nab M.D.   On: 03/02/2022 07:34   DG Chest Port 1 View  Result Date: 03/01/2022 CLINICAL DATA:  Shortness of breath. EXAM: PORTABLE CHEST 1 VIEW COMPARISON:  02/28/2022 FINDINGS: No significant change in a large left pleural effusion. Increased patchy density in the adjacent left lung. Interval patchy density in the right lower lung zone. Slight increase in size of a small right pleural effusion. Left PICC tip in the region of the superior cavoatrial junction. A pill right basilar chest tube is again noted. No pneumothorax. Grossly normal sized heart. Partially calcified thoracic aorta. Mild right and minimal left shoulder degenerative changes. IMPRESSION: 1. Interval patchy atelectasis or pneumonia in the right lower lung zone. 2. Increased patchy atelectasis or pneumonia in the aerated portion of the left  lung inferiorly. 3. Stable large left pleural effusion and slight increase in size of a small right pleural effusion. Electronically Signed   By: Claudie Revering M.D.   On: 03/01/2022 17:10   DG Chest Port 1 View  Result Date: 02/28/2022 CLINICAL DATA:  Pleural effusion EXAM: PORTABLE CHEST 1 VIEW COMPARISON:  Previous studies including the examination done earlier today FINDINGS: There is large left pleural effusion obscuring left mid and left lower lung fields. There is minimal blunting of right lateral CP angle. Right chest tube is noted with its tip in the medial right lower lung fields. Proximal portion of the right pigtail chest tube appears to be coiled possibly in the pleural space or chest wall. This finding has not changed. There is no pneumothorax. Visualized lung fields show no signs of pulmonary edema or focal consolidation. Possible subsegmental atelectasis is seen in the right lower lung fields. Evaluation of left mid and left lower lung fields for atelectasis/pneumonia is limited by the effusion. Tip of PICC line introduced through  the left upper extremity is seen in the region of junction of superior vena cava and right atrium. IMPRESSION: Large left pleural effusion has not changed significantly. Small right pleural effusion. Pigtail right chest tube. Proximal portion of the chest tube is coiled, possibly in the pleural space or chest wall. There is no pneumothorax. Electronically Signed   By: Elmer Picker M.D.   On: 02/28/2022 19:54   DG Chest 1 View  Result Date: 02/28/2022 CLINICAL DATA:  Chest crackles.  Encounter for chest tube placement. EXAM: CHEST  1 VIEW COMPARISON:  AP chest 02/27/2022 FINDINGS: New left upper extremity PICC line tip overlies the superior vena cava/right atrial junction. Moderate-to-large left pleural effusion is unchanged. The right lung is well aerated.  No definite right pleural effusion. Right lower thorax pigtail drainage catheter is at a similar height.  No pneumothorax is seen. Right axillary surgical clips. IMPRESSION: One. No significant change in moderate-to-large left pleural effusion. No significant change in position of right basilar pigtail drainage catheter. No significant right pleural effusion. No right pneumothorax is seen. Electronically Signed   By: Yvonne Kendall M.D.   On: 02/28/2022 17:40   DG Lumbar Spine 2-3 Views  Result Date: 02/28/2022 CLINICAL DATA:  Multiple falls. Low back and tailbone pain. EXAM: LUMBAR SPINE - 2-3 VIEW COMPARISON:  Abdominopelvic CT 03/11/2022. FINDINGS: Five lumbar type vertebral bodies. The alignment is normal. No evidence of acute fracture or pars defect. There is multilevel spondylosis with disc space narrowing and endplate osteophyte formation at the lower 3 levels. Mild facet degenerative changes are present. There is aortic atherosclerosis. IMPRESSION: No evidence of acute lumbar spine fracture or malalignment. Mild spondylosis. Electronically Signed   By: Richardean Sale M.D.   On: 02/28/2022 15:22   DG Sacrum/Coccyx  Result Date: 02/28/2022 CLINICAL DATA:  Multiple falls.  Low back and tailbone pain. EXAM: SACRUM AND COCCYX - 2+ VIEW COMPARISON:  Pelvic CT 03/09/2022. FINDINGS: The bones appear mildly demineralized. No evidence of acute fracture or sacroiliac joint diastasis. The symphysis pubis appears normal. Scattered pelvic calcifications, similar to previous CT and likely phleboliths. IMPRESSION: No evidence of acute sacrococcygeal fracture. Electronically Signed   By: Richardean Sale M.D.   On: 02/28/2022 15:20   DG Chest 1 View  Result Date: 02/27/2022 CLINICAL DATA:  Chest crackles.  Chest tube placement. EXAM: CHEST  1 VIEW COMPARISON:  Earlier same day.  02/26/2022. FINDINGS: No visible pleural fluid on the right. No right pneumothorax. Mild atelectasis at the right lung base. Pleural catheter overlies the lower thoracic midline. Large left pleural effusion is unchanged with collapse of the  left lower lobe. Skin fold present on the left. Right arm PICC has been removed. IMPRESSION: Pleural catheter projected over the midline. No visible right effusion or right pneumothorax. Persistent large effusion on the left with left lower lobe collapse. Electronically Signed   By: Nelson Chimes M.D.   On: 02/27/2022 11:48   [place] EKG SITE RITE  Result Date: 02/27/2022 If Site Rite image not attached, placement could not be confirmed due to current cardiac rhythm.  DG Chest Port 1 View  Result Date: 02/27/2022 CLINICAL DATA:  Respiratory failure.  Chest tube. EXAM: PORTABLE CHEST 1 VIEW COMPARISON:  Portable chest yesterday at 5:50 a.m. FINDINGS: 5:07 a.m., 02/27/2022.  Patient is moderately rotated to the left. Pigtail of the pleural catheter is now left of the midline at the level of T12. No pneumothorax is seen. There is only a small right pleural  effusion remaining and it appears improved. Right PICC tip remains at the superior cavoatrial junction. A large left pleural effusion again obscures the lower 2/3 of the left chest. There is aortic atherosclerosis and tortuosity. The cardiac size is stable. Remaining lungs clear with COPD change. There are surgical clips in the right axilla. Osteopenia and degenerative change. IMPRESSION: 1. The pigtail of the pleural catheter is now left of the midline in the base of the chest, exact position unclear but there is only minimal right pleural fluid remaining today. 2. Large left pleural effusion obscuring the lower 2/3 of the left chest,, stable. 3. COPD, aortic atherosclerosis. Electronically Signed   By: Telford Nab M.D.   On: 02/27/2022 06:52    Microbiology No results found for this or any previous visit (from the past 240 hour(s)).  Lab Basic Metabolic Panel: Recent Labs  Lab 03/22/22 0750 03/24/22 0406 03/25/22 0337 2022/04/12 0359  NA 140 146* 143 140  K 4.2 4.0 5.0 4.8  CL 107 115* 113* 109  CO2 28 27 19* 24  GLUCOSE 195* 232* 96 181*  BUN  44* 40* 41* 54*  CREATININE <0.30* 0.39* 0.48 0.65  CALCIUM 8.0* 8.3* 8.5* 7.8*  MG 2.0 2.2 2.3 2.1  PHOS 3.3 3.4  --  6.8*   Liver Function Tests: Recent Labs  Lab 03/22/22 0750 03/24/22 0406  AST 74* 43*  ALT 83* 49*  ALKPHOS 378* 398*  BILITOT 0.9 0.9  PROT 4.3* 4.4*  ALBUMIN <1.5* <1.5*   Recent Labs  Lab April 12, 2022 0359  LIPASE 15  AMYLASE 75   No results for input(s): "AMMONIA" in the last 168 hours. CBC: Recent Labs  Lab 03/22/22 0435 03/23/22 0509 03/24/22 0406 03/25/22 0337 2022-04-12 0359  WBC 22.5* 20.3* 19.5* 15.3* 11.8*  NEUTROABS 6.8 3.9 4.8 3.7 0.4*  HGB 10.5* 10.0* 10.3* 10.2* 9.7*  HCT 32.2* 31.2* 32.1* 33.4* 30.6*  MCV 94.2 92.0 90.9 96.3 92.7  PLT 174 174 143* 120* 108*   Cardiac Enzymes: No results for input(s): "CKTOTAL", "CKMB", "CKMBINDEX", "TROPONINI" in the last 168 hours. Sepsis Labs: Recent Labs  Lab 03/23/22 0509 03/24/22 0406 03/25/22 0337 04-12-2022 0359  WBC 20.3* 19.5* 15.3* 11.8*  LATICACIDVEN  --   --   --  0.9    Procedures/Operations  Bilateral chest tubes   Bobbye Reinitz A Andree Heeg 03/28/2022, 11:45 AM

## 2022-04-15 NOTE — Progress Notes (Signed)
[facility] Progress Note Patient Name: [name] DOB: Dec 03, 1960 MRN: [id number]   Date of Service  2022-04-18  HPI/Events of Note  Nursing concerned about a decline in mental status. Last blood glucose = 157.  eICU Interventions  Plan: Repeat POC blood glucose STAT. ABG STAT. Head CT Scan w/o contrast STAT.     Intervention Category Major Interventions: Change in mental status - evaluation and management  Sheilah Rayos Eugene 04/18/2022, 4:43 AM

## 2022-04-15 NOTE — Progress Notes (Signed)
eLink Physician-Brief Progress Note Patient Name: [name] DOB: 31-Jul-1961 MRN: [id number]   Date of Service  22-Apr-2022  HPI/Events of Note  Hypotension - BP = 60/40.  eICU Interventions  Plan: Norepinephrine IV infusion. Titrate to MAP >= 65. Narcan 0.4 mg IV now and PRN.         Desmen Schoffstall [name] 04-22-2022, 5:34 AM

## 2022-04-15 NOTE — Progress Notes (Signed)
Family made aware of changes in pt presentation. Family OTW   Pt transported to CT for scan of abd and head

## 2022-04-15 NOTE — Progress Notes (Addendum)
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 Weight 64kg on admit and now 48.7 kg  Assessment:  31 YOF with pertinent PMH of CLL, depression, anxiety, HLD, HTN presents to [facility] ED on 6/2 unresponsive.  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 Patient also has bilateral chylothorax with high output. Pharmacy has been consulted for TPN.  Glucose / Insulin: new onset DM2 in addition to steroids, A1c 6.5% - CBGs 122-217 - Used 17 units SSI in past 24hr, 10 units of regular insulin in TPN starting on 7/11 (Semglee 10 units, last dose on 7/10) - Remains on prednisone 50mg  daily, sirolimus 2mg  daily Electrolytes: Phos up to 6.8, other WNL including Ca 7.8 (CorrCa 9.8) Renal: SCr increasing (baseline around 0.3, now up to 0.65), BUN elevated/increasing Hepatic: alk phos elevated and rising, tbili and TG WNL, albumin <1.5, AST/ALT elevated but decreasing Intake / Output: UOP down to 200 mL (charting?), LBM 7/9 GI Surgeries / Procedures:  7/3 thoracic duct embolization  7/12 CT abd: Bulky lymphadenopathy, anasarca, Interval development of gas within the complex left-sided pleural fluid collections, possible cirrhosis, aortic atherosclerosis.  Central access: PICC placed 02/27/22, replaced 7/3 TPN start date: 6/13-6/29, resumed 7/1   Nutritional Goals:  RD Estimated Needs Total Energy Estimated Needs: 2300-2600 Total Protein Estimated Needs: 140-160 grams Total Fluid Estimated Needs: >2.5 L  Current Nutrition:  TPN NPO; NG tube to LIS  Plan:  Continue TPN at goal rate of 100 ml/hr Electrolytes in TPN: Na 40 mEq/L, K 0 mEq/L, Ca 0 mEq/L, Mg 2 mEq/L, Phos 0 mmol/L, Cl:Ac - max Ac Continue standard multivitamin, trace elements in TPN Continue famotidine  in TPN  Continue 10 units regular insulin in TPN; continue resistant SSI  TPN labs on Mon/Thurs.    Gretta Arab PharmD, BCPS Clinical Pharmacist [facility] main pharmacy [phone] 04-18-2022 7:26 AM   Addendum: TPN consult d/c  Gretta Arab PharmD, BCPS Clinical Pharmacist [facility] main pharmacy [phone] 04-18-22 8:39 AM

## 2022-04-15 DEATH — deceased
